# Patient Record
Sex: Male | Born: 1960 | Race: White | Hispanic: No | Smoking: Current every day smoker
Health system: Southern US, Community
[De-identification: ages and names within clinical notes are randomized; demographics above are authoritative.]

## PROBLEM LIST (undated history)

## (undated) DIAGNOSIS — I219 Acute myocardial infarction, unspecified: Secondary | ICD-10-CM

## (undated) DIAGNOSIS — I10 Essential (primary) hypertension: Secondary | ICD-10-CM

## (undated) DIAGNOSIS — K52832 Lymphocytic colitis: Secondary | ICD-10-CM

## (undated) DIAGNOSIS — E785 Hyperlipidemia, unspecified: Secondary | ICD-10-CM

## (undated) DIAGNOSIS — F1011 Alcohol abuse, in remission: Secondary | ICD-10-CM

## (undated) DIAGNOSIS — I639 Cerebral infarction, unspecified: Secondary | ICD-10-CM

## (undated) DIAGNOSIS — R3915 Urgency of urination: Secondary | ICD-10-CM

## (undated) DIAGNOSIS — Z87442 Personal history of urinary calculi: Secondary | ICD-10-CM

## (undated) DIAGNOSIS — M1990 Unspecified osteoarthritis, unspecified site: Secondary | ICD-10-CM

## (undated) DIAGNOSIS — D126 Benign neoplasm of colon, unspecified: Secondary | ICD-10-CM

## (undated) DIAGNOSIS — I739 Peripheral vascular disease, unspecified: Secondary | ICD-10-CM

## (undated) DIAGNOSIS — C61 Malignant neoplasm of prostate: Secondary | ICD-10-CM

## (undated) DIAGNOSIS — K219 Gastro-esophageal reflux disease without esophagitis: Secondary | ICD-10-CM

## (undated) DIAGNOSIS — G459 Transient cerebral ischemic attack, unspecified: Secondary | ICD-10-CM

## (undated) DIAGNOSIS — N189 Chronic kidney disease, unspecified: Secondary | ICD-10-CM

## (undated) DIAGNOSIS — K7581 Nonalcoholic steatohepatitis (NASH): Secondary | ICD-10-CM

## (undated) DIAGNOSIS — Z86010 Personal history of colonic polyps: Secondary | ICD-10-CM

## (undated) DIAGNOSIS — M549 Dorsalgia, unspecified: Secondary | ICD-10-CM

## (undated) DIAGNOSIS — R7303 Prediabetes: Secondary | ICD-10-CM

## (undated) DIAGNOSIS — Z8709 Personal history of other diseases of the respiratory system: Secondary | ICD-10-CM

## (undated) DIAGNOSIS — I2510 Atherosclerotic heart disease of native coronary artery without angina pectoris: Secondary | ICD-10-CM

## (undated) DIAGNOSIS — K2270 Barrett's esophagus without dysplasia: Secondary | ICD-10-CM

## (undated) DIAGNOSIS — M2550 Pain in unspecified joint: Secondary | ICD-10-CM

## (undated) DIAGNOSIS — F1010 Alcohol abuse, uncomplicated: Secondary | ICD-10-CM

## (undated) DIAGNOSIS — J449 Chronic obstructive pulmonary disease, unspecified: Secondary | ICD-10-CM

## (undated) DIAGNOSIS — G8929 Other chronic pain: Secondary | ICD-10-CM

## (undated) HISTORY — DX: Chronic obstructive pulmonary disease, unspecified: J44.9

## (undated) HISTORY — DX: Peripheral vascular disease, unspecified: I73.9

## (undated) HISTORY — DX: Cerebral infarction, unspecified: I63.9

## (undated) HISTORY — PX: COLONOSCOPY: SHX174

## (undated) HISTORY — DX: Essential (primary) hypertension: I10

## (undated) HISTORY — PX: PROSTATE BIOPSY: SHX241

## (undated) HISTORY — DX: Benign neoplasm of colon, unspecified: D12.6

## (undated) HISTORY — DX: Transient cerebral ischemic attack, unspecified: G45.9

## (undated) HISTORY — PX: LUMBAR SPINE SURGERY: SHX701

## (undated) HISTORY — DX: Alcohol abuse, in remission: F10.11

## (undated) HISTORY — DX: Hyperlipidemia, unspecified: E78.5

## (undated) HISTORY — DX: Gastro-esophageal reflux disease without esophagitis: K21.9

## (undated) HISTORY — PX: ESOPHAGOGASTRODUODENOSCOPY: SHX1529

## (undated) HISTORY — DX: Lymphocytic colitis: K52.832

## (undated) HISTORY — PX: BACK SURGERY: SHX140

## (undated) HISTORY — DX: Dorsalgia, unspecified: M54.9

## (undated) HISTORY — DX: Barrett's esophagus without dysplasia: K22.70

## (undated) HISTORY — DX: Personal history of colonic polyps: Z86.010

## (undated) HISTORY — PX: NASAL SINUS SURGERY: SHX719

## (undated) HISTORY — DX: Nonalcoholic steatohepatitis (NASH): K75.81

## (undated) HISTORY — DX: Other chronic pain: G89.29

## (undated) HISTORY — DX: Alcohol abuse, uncomplicated: F10.10

---

## 1998-12-26 ENCOUNTER — Emergency Department (HOSPITAL_COMMUNITY): Admission: EM | Admit: 1998-12-26 | Discharge: 1998-12-26 | Payer: Self-pay | Admitting: Emergency Medicine

## 1999-03-11 ENCOUNTER — Emergency Department (HOSPITAL_COMMUNITY): Admission: EM | Admit: 1999-03-11 | Discharge: 1999-03-11 | Payer: Self-pay | Admitting: Emergency Medicine

## 1999-03-29 ENCOUNTER — Ambulatory Visit: Admission: RE | Admit: 1999-03-29 | Discharge: 1999-03-29 | Payer: Self-pay | Admitting: Endocrinology

## 2000-01-28 ENCOUNTER — Encounter: Payer: Self-pay | Admitting: Internal Medicine

## 2000-01-28 ENCOUNTER — Emergency Department (HOSPITAL_COMMUNITY): Admission: EM | Admit: 2000-01-28 | Discharge: 2000-01-28 | Payer: Self-pay | Admitting: Emergency Medicine

## 2000-02-03 ENCOUNTER — Ambulatory Visit (HOSPITAL_COMMUNITY): Admission: RE | Admit: 2000-02-03 | Discharge: 2000-02-03 | Payer: Self-pay | Admitting: Endocrinology

## 2000-02-03 ENCOUNTER — Encounter: Payer: Self-pay | Admitting: Endocrinology

## 2000-02-06 ENCOUNTER — Ambulatory Visit: Admission: RE | Admit: 2000-02-06 | Discharge: 2000-02-06 | Payer: Self-pay | Admitting: Endocrinology

## 2000-09-01 ENCOUNTER — Emergency Department (HOSPITAL_COMMUNITY): Admission: EM | Admit: 2000-09-01 | Discharge: 2000-09-01 | Payer: Self-pay | Admitting: *Deleted

## 2000-12-23 ENCOUNTER — Encounter: Payer: Self-pay | Admitting: Neurosurgery

## 2000-12-23 ENCOUNTER — Ambulatory Visit (HOSPITAL_COMMUNITY): Admission: RE | Admit: 2000-12-23 | Discharge: 2000-12-23 | Payer: Self-pay | Admitting: Neurosurgery

## 2001-01-21 ENCOUNTER — Encounter: Payer: Self-pay | Admitting: Neurosurgery

## 2001-01-21 ENCOUNTER — Ambulatory Visit (HOSPITAL_COMMUNITY): Admission: RE | Admit: 2001-01-21 | Discharge: 2001-01-21 | Payer: Self-pay | Admitting: Neurosurgery

## 2001-02-04 ENCOUNTER — Encounter: Payer: Self-pay | Admitting: Neurosurgery

## 2001-02-04 ENCOUNTER — Ambulatory Visit (HOSPITAL_COMMUNITY): Admission: RE | Admit: 2001-02-04 | Discharge: 2001-02-04 | Payer: Self-pay | Admitting: Neurosurgery

## 2001-02-23 ENCOUNTER — Ambulatory Visit (HOSPITAL_COMMUNITY): Admission: RE | Admit: 2001-02-23 | Discharge: 2001-02-23 | Payer: Self-pay | Admitting: Neurosurgery

## 2001-02-23 ENCOUNTER — Encounter: Payer: Self-pay | Admitting: Neurosurgery

## 2001-05-15 ENCOUNTER — Emergency Department (HOSPITAL_COMMUNITY): Admission: EM | Admit: 2001-05-15 | Discharge: 2001-05-15 | Payer: Self-pay | Admitting: Emergency Medicine

## 2001-12-29 HISTORY — PX: BAND HEMORRHOIDECTOMY: SHX1213

## 2002-05-06 ENCOUNTER — Encounter: Admission: RE | Admit: 2002-05-06 | Discharge: 2002-05-06 | Payer: Self-pay | Admitting: Neurosurgery

## 2002-06-10 ENCOUNTER — Emergency Department (HOSPITAL_COMMUNITY): Admission: EM | Admit: 2002-06-10 | Discharge: 2002-06-10 | Payer: Self-pay | Admitting: Emergency Medicine

## 2002-07-20 DIAGNOSIS — Z860100 Personal history of colon polyps, unspecified: Secondary | ICD-10-CM

## 2002-07-20 HISTORY — DX: Personal history of colon polyps, unspecified: Z86.0100

## 2002-07-20 HISTORY — DX: Personal history of colonic polyps: Z86.010

## 2002-10-12 ENCOUNTER — Observation Stay (HOSPITAL_COMMUNITY): Admission: EM | Admit: 2002-10-12 | Discharge: 2002-10-13 | Payer: Self-pay | Admitting: Emergency Medicine

## 2002-10-12 ENCOUNTER — Encounter: Payer: Self-pay | Admitting: Internal Medicine

## 2002-10-12 ENCOUNTER — Encounter: Payer: Self-pay | Admitting: Emergency Medicine

## 2002-10-13 ENCOUNTER — Encounter (INDEPENDENT_AMBULATORY_CARE_PROVIDER_SITE_OTHER): Payer: Self-pay | Admitting: *Deleted

## 2002-12-11 ENCOUNTER — Emergency Department (HOSPITAL_COMMUNITY): Admission: EM | Admit: 2002-12-11 | Discharge: 2002-12-11 | Payer: Self-pay | Admitting: Emergency Medicine

## 2002-12-12 ENCOUNTER — Emergency Department (HOSPITAL_COMMUNITY): Admission: EM | Admit: 2002-12-12 | Discharge: 2002-12-12 | Payer: Self-pay | Admitting: Emergency Medicine

## 2002-12-12 ENCOUNTER — Encounter: Payer: Self-pay | Admitting: Internal Medicine

## 2002-12-13 ENCOUNTER — Ambulatory Visit (HOSPITAL_COMMUNITY): Admission: RE | Admit: 2002-12-13 | Discharge: 2002-12-13 | Payer: Self-pay | Admitting: Internal Medicine

## 2003-02-10 ENCOUNTER — Ambulatory Visit (HOSPITAL_COMMUNITY): Admission: RE | Admit: 2003-02-10 | Discharge: 2003-02-10 | Payer: Self-pay | Admitting: Internal Medicine

## 2003-06-17 ENCOUNTER — Emergency Department (HOSPITAL_COMMUNITY): Admission: EM | Admit: 2003-06-17 | Discharge: 2003-06-17 | Payer: Self-pay | Admitting: Emergency Medicine

## 2003-06-17 ENCOUNTER — Encounter: Payer: Self-pay | Admitting: Emergency Medicine

## 2003-07-04 ENCOUNTER — Encounter: Payer: Self-pay | Admitting: Gastroenterology

## 2003-07-04 ENCOUNTER — Inpatient Hospital Stay (HOSPITAL_COMMUNITY): Admission: EM | Admit: 2003-07-04 | Discharge: 2003-07-06 | Payer: Self-pay | Admitting: Emergency Medicine

## 2003-07-05 ENCOUNTER — Encounter: Payer: Self-pay | Admitting: Gastroenterology

## 2003-07-05 ENCOUNTER — Encounter (INDEPENDENT_AMBULATORY_CARE_PROVIDER_SITE_OTHER): Payer: Self-pay | Admitting: Specialist

## 2003-07-06 ENCOUNTER — Encounter: Payer: Self-pay | Admitting: Gastroenterology

## 2004-05-13 ENCOUNTER — Observation Stay (HOSPITAL_COMMUNITY): Admission: EM | Admit: 2004-05-13 | Discharge: 2004-05-14 | Payer: Self-pay | Admitting: Emergency Medicine

## 2004-11-07 ENCOUNTER — Ambulatory Visit: Payer: Self-pay | Admitting: Pain Medicine

## 2004-11-19 ENCOUNTER — Ambulatory Visit: Payer: Self-pay | Admitting: Pain Medicine

## 2004-12-24 ENCOUNTER — Ambulatory Visit: Payer: Self-pay | Admitting: Pain Medicine

## 2005-01-07 ENCOUNTER — Ambulatory Visit: Payer: Self-pay | Admitting: Physician Assistant

## 2005-01-15 ENCOUNTER — Ambulatory Visit: Payer: Self-pay | Admitting: Pain Medicine

## 2005-02-04 ENCOUNTER — Ambulatory Visit: Payer: Self-pay | Admitting: Physician Assistant

## 2005-02-26 ENCOUNTER — Emergency Department (HOSPITAL_COMMUNITY): Admission: EM | Admit: 2005-02-26 | Discharge: 2005-02-26 | Payer: Self-pay | Admitting: Emergency Medicine

## 2005-03-18 ENCOUNTER — Ambulatory Visit: Payer: Self-pay | Admitting: Physician Assistant

## 2005-03-24 ENCOUNTER — Encounter: Admission: RE | Admit: 2005-03-24 | Discharge: 2005-03-24 | Payer: Self-pay | Admitting: Neurosurgery

## 2005-05-01 ENCOUNTER — Ambulatory Visit: Payer: Self-pay | Admitting: Physician Assistant

## 2005-05-29 ENCOUNTER — Ambulatory Visit: Payer: Self-pay | Admitting: Physician Assistant

## 2005-06-26 ENCOUNTER — Ambulatory Visit: Payer: Self-pay | Admitting: Physician Assistant

## 2005-07-02 ENCOUNTER — Ambulatory Visit: Payer: Self-pay | Admitting: Endocrinology

## 2005-07-21 ENCOUNTER — Ambulatory Visit: Payer: Self-pay | Admitting: Endocrinology

## 2005-07-28 ENCOUNTER — Ambulatory Visit: Payer: Self-pay | Admitting: Physician Assistant

## 2005-07-29 ENCOUNTER — Ambulatory Visit: Payer: Self-pay | Admitting: Endocrinology

## 2005-08-07 ENCOUNTER — Ambulatory Visit: Payer: Self-pay | Admitting: Internal Medicine

## 2005-08-11 ENCOUNTER — Ambulatory Visit (HOSPITAL_COMMUNITY): Admission: RE | Admit: 2005-08-11 | Discharge: 2005-08-11 | Payer: Self-pay | Admitting: Internal Medicine

## 2005-08-11 ENCOUNTER — Ambulatory Visit: Payer: Self-pay | Admitting: Internal Medicine

## 2005-08-28 ENCOUNTER — Ambulatory Visit: Payer: Self-pay | Admitting: Physician Assistant

## 2005-09-15 ENCOUNTER — Ambulatory Visit (HOSPITAL_COMMUNITY): Admission: RE | Admit: 2005-09-15 | Discharge: 2005-09-15 | Payer: Self-pay | Admitting: Neurosurgery

## 2005-09-15 ENCOUNTER — Ambulatory Visit: Payer: Self-pay | Admitting: Endocrinology

## 2005-09-16 ENCOUNTER — Ambulatory Visit: Payer: Self-pay

## 2005-09-17 ENCOUNTER — Inpatient Hospital Stay (HOSPITAL_COMMUNITY): Admission: RE | Admit: 2005-09-17 | Discharge: 2005-09-19 | Payer: Self-pay | Admitting: Neurosurgery

## 2005-10-03 ENCOUNTER — Ambulatory Visit: Payer: Self-pay | Admitting: Physician Assistant

## 2006-01-30 ENCOUNTER — Emergency Department (HOSPITAL_COMMUNITY): Admission: EM | Admit: 2006-01-30 | Discharge: 2006-01-30 | Payer: Self-pay | Admitting: Emergency Medicine

## 2006-02-09 ENCOUNTER — Ambulatory Visit: Payer: Self-pay | Admitting: Physician Assistant

## 2006-06-10 ENCOUNTER — Ambulatory Visit: Payer: Self-pay | Admitting: Endocrinology

## 2006-08-05 ENCOUNTER — Ambulatory Visit: Payer: Self-pay | Admitting: Endocrinology

## 2006-08-10 ENCOUNTER — Ambulatory Visit: Payer: Self-pay | Admitting: Endocrinology

## 2006-09-11 ENCOUNTER — Ambulatory Visit: Payer: Self-pay | Admitting: Physician Assistant

## 2006-10-06 ENCOUNTER — Ambulatory Visit: Payer: Self-pay | Admitting: Physician Assistant

## 2006-11-03 ENCOUNTER — Ambulatory Visit: Payer: Self-pay | Admitting: Physician Assistant

## 2006-12-07 ENCOUNTER — Ambulatory Visit: Payer: Self-pay | Admitting: Physician Assistant

## 2006-12-11 ENCOUNTER — Emergency Department (HOSPITAL_COMMUNITY): Admission: EM | Admit: 2006-12-11 | Discharge: 2006-12-11 | Payer: Self-pay | Admitting: Emergency Medicine

## 2007-01-07 ENCOUNTER — Ambulatory Visit: Payer: Self-pay | Admitting: Physician Assistant

## 2007-02-09 ENCOUNTER — Ambulatory Visit: Payer: Self-pay | Admitting: Physician Assistant

## 2007-03-09 ENCOUNTER — Ambulatory Visit: Payer: Self-pay | Admitting: Physician Assistant

## 2007-04-12 ENCOUNTER — Ambulatory Visit: Payer: Self-pay | Admitting: Physician Assistant

## 2007-04-28 ENCOUNTER — Other Ambulatory Visit: Payer: Self-pay

## 2007-04-28 ENCOUNTER — Ambulatory Visit: Payer: Self-pay | Admitting: Pain Medicine

## 2007-05-04 ENCOUNTER — Ambulatory Visit: Payer: Self-pay | Admitting: Pain Medicine

## 2007-05-12 ENCOUNTER — Ambulatory Visit: Payer: Self-pay | Admitting: Physician Assistant

## 2007-05-19 ENCOUNTER — Ambulatory Visit: Payer: Self-pay | Admitting: Physician Assistant

## 2007-06-09 ENCOUNTER — Ambulatory Visit: Payer: Self-pay | Admitting: Physician Assistant

## 2007-07-22 ENCOUNTER — Ambulatory Visit: Payer: Self-pay | Admitting: Physician Assistant

## 2007-08-10 ENCOUNTER — Ambulatory Visit: Payer: Self-pay | Admitting: Pain Medicine

## 2007-08-24 ENCOUNTER — Ambulatory Visit: Payer: Self-pay | Admitting: Physician Assistant

## 2007-08-25 ENCOUNTER — Encounter: Payer: Self-pay | Admitting: Endocrinology

## 2007-08-25 DIAGNOSIS — I10 Essential (primary) hypertension: Secondary | ICD-10-CM

## 2007-08-25 DIAGNOSIS — M109 Gout, unspecified: Secondary | ICD-10-CM

## 2007-08-25 DIAGNOSIS — K219 Gastro-esophageal reflux disease without esophagitis: Secondary | ICD-10-CM

## 2007-09-23 ENCOUNTER — Ambulatory Visit: Payer: Self-pay | Admitting: Pain Medicine

## 2007-10-25 ENCOUNTER — Ambulatory Visit: Payer: Self-pay | Admitting: Physician Assistant

## 2007-11-16 ENCOUNTER — Ambulatory Visit: Payer: Self-pay | Admitting: Pain Medicine

## 2007-11-22 ENCOUNTER — Ambulatory Visit: Payer: Self-pay | Admitting: Physician Assistant

## 2007-12-20 ENCOUNTER — Ambulatory Visit: Payer: Self-pay | Admitting: Physician Assistant

## 2007-12-30 DIAGNOSIS — K52832 Lymphocytic colitis: Secondary | ICD-10-CM

## 2007-12-30 HISTORY — DX: Lymphocytic colitis: K52.832

## 2008-02-04 ENCOUNTER — Ambulatory Visit: Payer: Self-pay | Admitting: Physician Assistant

## 2008-02-07 ENCOUNTER — Encounter: Admission: RE | Admit: 2008-02-07 | Discharge: 2008-02-07 | Payer: Self-pay | Admitting: Family Medicine

## 2008-05-02 ENCOUNTER — Ambulatory Visit: Payer: Self-pay | Admitting: Physician Assistant

## 2008-08-01 ENCOUNTER — Ambulatory Visit: Payer: Self-pay | Admitting: Physician Assistant

## 2008-11-01 ENCOUNTER — Ambulatory Visit: Payer: Self-pay | Admitting: Pain Medicine

## 2008-11-17 ENCOUNTER — Inpatient Hospital Stay (HOSPITAL_COMMUNITY): Admission: EM | Admit: 2008-11-17 | Discharge: 2008-11-18 | Payer: Self-pay | Admitting: Emergency Medicine

## 2008-11-17 ENCOUNTER — Encounter (INDEPENDENT_AMBULATORY_CARE_PROVIDER_SITE_OTHER): Payer: Self-pay | Admitting: *Deleted

## 2008-12-29 HISTORY — PX: CARDIAC CATHETERIZATION: SHX172

## 2009-02-01 ENCOUNTER — Ambulatory Visit: Payer: Self-pay | Admitting: Physician Assistant

## 2009-11-28 ENCOUNTER — Inpatient Hospital Stay (HOSPITAL_COMMUNITY): Admission: EM | Admit: 2009-11-28 | Discharge: 2009-11-30 | Payer: Self-pay | Admitting: Emergency Medicine

## 2010-02-27 ENCOUNTER — Observation Stay (HOSPITAL_COMMUNITY): Admission: EM | Admit: 2010-02-27 | Discharge: 2010-02-27 | Payer: Self-pay | Admitting: Emergency Medicine

## 2010-03-01 ENCOUNTER — Ambulatory Visit: Payer: Self-pay | Admitting: Gastroenterology

## 2010-03-01 DIAGNOSIS — Z8719 Personal history of other diseases of the digestive system: Secondary | ICD-10-CM | POA: Insufficient documentation

## 2010-03-01 DIAGNOSIS — A0472 Enterocolitis due to Clostridium difficile, not specified as recurrent: Secondary | ICD-10-CM

## 2010-03-04 ENCOUNTER — Telehealth: Payer: Self-pay | Admitting: Nurse Practitioner

## 2010-03-08 ENCOUNTER — Telehealth: Payer: Self-pay | Admitting: Nurse Practitioner

## 2010-03-11 ENCOUNTER — Telehealth: Payer: Self-pay | Admitting: Nurse Practitioner

## 2010-03-12 ENCOUNTER — Ambulatory Visit: Payer: Self-pay | Admitting: Nurse Practitioner

## 2010-03-12 ENCOUNTER — Ambulatory Visit: Payer: Self-pay | Admitting: Gastroenterology

## 2010-03-12 DIAGNOSIS — K648 Other hemorrhoids: Secondary | ICD-10-CM | POA: Insufficient documentation

## 2010-03-12 LAB — CONVERTED CEMR LAB
BUN: 16 mg/dL (ref 6–23)
Eosinophils Absolute: 0.1 10*3/uL (ref 0.0–0.7)
HCT: 44.2 % (ref 39.0–52.0)
Lymphocytes Relative: 41 % (ref 12.0–46.0)
MCV: 89.3 fL (ref 78.0–100.0)
Neutro Abs: 3.9 10*3/uL (ref 1.4–7.7)
Neutrophils Relative %: 48.4 % (ref 43.0–77.0)
Potassium: 4.7 meq/L (ref 3.5–5.1)
WBC: 8 10*3/uL (ref 4.5–10.5)

## 2010-03-13 ENCOUNTER — Telehealth: Payer: Self-pay | Admitting: Nurse Practitioner

## 2010-03-22 ENCOUNTER — Telehealth: Payer: Self-pay | Admitting: Nurse Practitioner

## 2010-03-25 ENCOUNTER — Encounter (INDEPENDENT_AMBULATORY_CARE_PROVIDER_SITE_OTHER): Payer: Self-pay | Admitting: *Deleted

## 2010-03-25 ENCOUNTER — Telehealth: Payer: Self-pay | Admitting: Nurse Practitioner

## 2010-03-25 ENCOUNTER — Ambulatory Visit: Payer: Self-pay | Admitting: Internal Medicine

## 2010-03-25 DIAGNOSIS — R197 Diarrhea, unspecified: Secondary | ICD-10-CM

## 2010-03-26 ENCOUNTER — Ambulatory Visit: Payer: Self-pay | Admitting: Internal Medicine

## 2010-03-28 ENCOUNTER — Encounter: Payer: Self-pay | Admitting: Internal Medicine

## 2010-03-28 ENCOUNTER — Telehealth: Payer: Self-pay | Admitting: Internal Medicine

## 2010-05-01 ENCOUNTER — Telehealth: Payer: Self-pay | Admitting: Internal Medicine

## 2010-06-06 ENCOUNTER — Emergency Department (HOSPITAL_COMMUNITY): Admission: EM | Admit: 2010-06-06 | Discharge: 2010-06-06 | Payer: Self-pay | Admitting: Emergency Medicine

## 2010-10-21 ENCOUNTER — Ambulatory Visit: Payer: Self-pay | Admitting: Internal Medicine

## 2010-10-21 ENCOUNTER — Inpatient Hospital Stay (HOSPITAL_COMMUNITY): Admission: EM | Admit: 2010-10-21 | Discharge: 2010-10-22 | Payer: Self-pay | Admitting: Emergency Medicine

## 2010-10-29 ENCOUNTER — Encounter: Admission: RE | Admit: 2010-10-29 | Discharge: 2010-10-29 | Payer: Self-pay | Admitting: Family Medicine

## 2010-11-07 ENCOUNTER — Encounter: Payer: Self-pay | Admitting: Family Medicine

## 2010-11-08 ENCOUNTER — Ambulatory Visit: Payer: Self-pay | Admitting: Cardiology

## 2010-11-18 ENCOUNTER — Ambulatory Visit (HOSPITAL_COMMUNITY): Admission: RE | Admit: 2010-11-18 | Discharge: 2010-11-18 | Payer: Self-pay | Admitting: Interventional Radiology

## 2010-12-02 ENCOUNTER — Ambulatory Visit (HOSPITAL_COMMUNITY): Admission: RE | Admit: 2010-12-02 | Discharge: 2010-12-02 | Payer: Self-pay | Admitting: Family Medicine

## 2010-12-09 ENCOUNTER — Ambulatory Visit: Payer: Self-pay | Admitting: Cardiology

## 2010-12-29 HISTORY — PX: ANEURYSM COILING: SHX5349

## 2011-01-01 ENCOUNTER — Inpatient Hospital Stay (HOSPITAL_COMMUNITY)
Admission: RE | Admit: 2011-01-01 | Discharge: 2011-01-02 | Payer: Self-pay | Source: Home / Self Care | Attending: Interventional Radiology | Admitting: Interventional Radiology

## 2011-01-01 LAB — APTT: aPTT: 32 seconds (ref 24–37)

## 2011-01-02 LAB — APTT: aPTT: 35 seconds (ref 24–37)

## 2011-01-02 LAB — CBC
HCT: 41.3 % (ref 39.0–52.0)
Hemoglobin: 14.1 g/dL (ref 13.0–17.0)
MCH: 30 pg (ref 26.0–34.0)
MCHC: 34.1 g/dL (ref 30.0–36.0)
MCV: 87.9 fL (ref 78.0–100.0)
Platelets: 164 10*3/uL (ref 150–400)
RBC: 4.7 MIL/uL (ref 4.22–5.81)
RDW: 12.5 % (ref 11.5–15.5)
WBC: 7.4 10*3/uL (ref 4.0–10.5)

## 2011-01-02 LAB — BASIC METABOLIC PANEL
BUN: 8 mg/dL (ref 6–23)
CO2: 25 mEq/L (ref 19–32)
Calcium: 9 mg/dL (ref 8.4–10.5)
Chloride: 109 mEq/L (ref 96–112)
Creatinine, Ser: 0.73 mg/dL (ref 0.4–1.5)
GFR calc Af Amer: >60 mL/min (ref >60)
GFR calc non Af Amer: >60 mL/min (ref >60)
Glucose, Bld: 111 mg/dL — ABNORMAL HIGH (ref 70–99)
Potassium: 4 mEq/L (ref 3.5–5.1)
Sodium: 140 mEq/L (ref 135–145)

## 2011-01-02 LAB — PROTIME-INR
INR: 0.9 (ref 0.00–1.49)
Prothrombin Time: 12.4 seconds (ref 11.6–15.2)

## 2011-01-15 ENCOUNTER — Ambulatory Visit (HOSPITAL_COMMUNITY)
Admission: RE | Admit: 2011-01-15 | Discharge: 2011-01-15 | Payer: Self-pay | Source: Home / Self Care | Attending: Interventional Radiology | Admitting: Interventional Radiology

## 2011-01-20 NOTE — H&P (Signed)
[name] [name], [name] [name]            ACCOUNT NO.:  [id number]  MEDICAL RECORD NO.:  [id number]          PATIENT TYPE:  AMB  LOCATION:  SDS                          FACILITY:  [facility]  PHYSICIAN:  Addilynne Olheiser K. Joani Cosma, M.D.DATE OF BIRTH:  26-Oct-1961  DATE OF ADMISSION:  01/01/2011 DATE OF DISCHARGE:                             HISTORY & PHYSICAL   CHIEF COMPLAINT:  Cerebral aneurysm.  BRIEF HISTORY:  This is a pleasant 50 year old male who was admitted to [facility] from October 21, 2010 to October 22, 2010 with weakness, presyncope, chest and arm pain.  The patient was evaluated by Cardiology and it was not felt that his symptoms were cardiac in nature.  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  This was felt to represent a possible increase in size from a previous study in 2009.  It was felt that the patient was having TIAs.  He was already on aspirin.  Plavix was added to his medications.  He had a cerebral angiogram performed on November 13, 2010, by Dr. 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 The patient was seen in consultation by Dr. Estanislado Pandy on December 02, 2010. At that time treatment options were discussed including the risks, benefits, and alternatives.  The patient elected to proceed with endovascular treatment and was admitted to [facility] today on January 01, 2011, for further intervention.  PAST MEDICAL HISTORY:  Significant for the above-noted TIAs with a possible CVA approximately 1 year ago.  He has a history of hyperlipidemia, hypertension, coronary artery disease with a cardiac catheterization in December 2010 revealing a 70-80% LAD stenosis, which was not felt to be amendable to percutaneous intervention.  The patient was seen recently by Dr. Mare Ferrari and cleared for general anesthesia. The patient also has a  history of gout, chronic back pain, asthma, cirrhosis, pancreatitis, tobacco use, seasonal allergies, hemorrhoids, and insomnia.  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  PAST SURGICAL HISTORY:  Significant for back surgery.  He still has problems with his back and ambulates with a cane.  ALLERGIES:  He is allergic to PENICILLIN and possibly LYRICA.  ADMITTING MEDICATIONS: 1. Advil p.r.n. 2. Fish oil. 3. Ventolin metered-dose inhaler 2 puffs every 4 hours as needed. 4. Trazodone 50 mg at bedtime. 5. Plavix 75 mg daily. 6. Nitroglycerin sublingually p.r.n. chest pain. 7. Nexium 40 mg twice daily. 8. Multivitamins daily. 9. Milk thistle over-the-counter daily. 10.Metoprolol 50 mg twice daily. 11.Losartan 100 mg each morning. 12.Aspirin 81 mg daily. 13.Allopurinol 100 mg daily.  SOCIAL HISTORY:  The patient is married.  He has two sisters, one with hypertension and one with psychiatric problems.  He had a brother who was killed in a motor vehicle accident.REVIEW OF SYSTEMS:  The review of systems was completely negative except for frequent headaches and back pain.  LABORATORY DATA:  An INR was 0.89, a protime was 12.2, a PTT was 27, BUN was 10, creatinine 0.7, potassium 4.5, GFR was greater than  60, and glucose was 117.  Hemoglobin was 15.4, hematocrit 44.7, WBC is 89,000, and platelets 181,000.  IMAGING DATA:  A preoperative screen was negative for MRSA.  PHYSICAL EXAMINATION:  GENERAL:  A pleasant, somewhat anxious 50 year old white male in no acute distress. VITAL SIGNS:  Blood pressure 139/97, pulse 80, respirations 18, temperature 97.9, and oxygen saturation 97% on room air. HEENT: Unremarkable. HEART:  Regular rate and rhythm without murmur. LUNGS:  Clear. ABDOMEN:  Soft, nontender. EXTREMITIES:  Pulses to be intact with minimal trace edema.  His airway was rated at a 1.  His ASA scale was rated at a 4. NEUROLOGIC:  The patient to be alert and  oriented and able to follow three-step commands.  Cranial nerves II through XII are grossly intact. Sensation was intact to light touch.  Motor strength revealed very mild left lower extremity weakness, otherwise motor strength was 5/5. Cerebellar testing was intact.  IMPRESSION: 1. Left posterior communicating artery aneurysm by cerebral angiogram     performed on November 13, 2010, that measured 4.3 mm x 3.7 mm. 2. History of transient ischemic attacks and a cerebrovascular     accident. 3. Hyperlipidemia. 4. Coronary artery disease with a 70-80% LAD stenosis not felt to be     amendable to intervention. 5. Gout. 6. Chronic back pain with gait disorder. 7. Asthma. 8. History of cirrhosis. 9. History of pancreatitis. 10.Remote history of alcohol abuse, sober x17 years. 11.History of tobacco use. 12.Seasonal allergies. 13.Hemorrhoids. 14.Insomnia. 15.Previous back surgery.  PLAN:  The patient is admitted today for endovascular treatment of his cerebral aneurysm to be performed by Dr. Estanislado Pandy under general anesthesia.     Mikey Bussing, P.A.   ______________________________ Fritz Pickerel. Estanislado Pandy, M.D.    DR/MEDQ  D:  01/01/2011  T:  01/01/2011  Job:  [id number]  cc:   Azalia Bilis, M.D. Darlin Coco, M.D.  Electronically Signed by Mikey Bussing P.A. on 01/14/2011 11:55:15 AM Electronically Signed by Luanne Bras M.D. on 01/20/2011 11:22:47 AM

## 2011-01-28 NOTE — Progress Notes (Signed)
Summary: triage   Phone Note Call from Patient Call back at [phone] (cell)   Caller: Patient Call For: [name] [name] Reason for Call: Talk to Nurse Summary of Call: progress report: still having diarrhea about 6-7 times a day... started seeing bright red blood in stool since Saturday and now blood coming from rectum without having a BM... feeling very bloated... abd is hurting up into ribcage Initial call taken by: Lucien Mons,  March 11, 2010 9:19 AM  Follow-up for Phone Call        Pt is down to 6-7 BM's of diarrhea with blood. Feels very tired.  I told pt to come to our lab tomorrow and come to see Nevin Bloodgood again at 2:00 PM per [place]. Follow-up by: Sharol Roussel,  March 11, 2010 4:23 PM

## 2011-01-28 NOTE — Procedures (Signed)
Summary: Flex Sig  Patient Name: [name] [name], [name] [name] MRN: [id number] Procedure Procedures: Flexible Proctosigmoidoscopy CPT: [phone].  Personnel: Endoscopist: Gatha Mayer, MD, [facility].  Exam Location: Exam performed in Endoscopy Suite. Outpatient  Patient Consent: Procedure, Alternatives, Risks and Benefits discussed, consent obtained,  Indications  Therapeutic Intervention Reason for exam: Hemorrhoidal Banding.  Symptoms: Rectal Bleeding.  History  Current Medications: Patient is not currently taking Coumadin.  Pre-Exam Physical: Performed Aug 11, 2005. Cardio-pulmonary exam  WNL. Rectal exam, HEENT exam , Abdominal exam, Mental status exam WNL.  Exam Exam: Extent visualized: Sigmoid Colon. Extent of exam: 35 cm. Patient position: on left side. Colon retroflexion performed. ASA Classification: I. Tolerance: excellent.  Monitoring: Pulse and BP monitoring, Oximetry used. Supplemental O2 given.  Colon Prep Used Fleets enema for colon prep. Prep: good.  Fluoroscopy: Fluoroscopy was not used.  Sedation Meds: Patient assessed and found to be appropriate for moderate (conscious) sedation. Fentanyl 50 mcg. given IV. Versed 6 mg. given IV.  Findings - NORMAL EXAM: Sigmoid Colon.  HEMORRHOIDS: Internal and External. Size: Small. ICD9: Hemorrhoids, Internal and  External: 455.6. Comments: His hemorrhoids are in the canal and very close to and below dentate line it appears.   Assessment  Diagnoses: 455.6: Hemorrhoids, Internal and  External.   Comments: Hemorrhoidal banding not performed due to size and location of hemorrhoids. I think they are small and there is an external component in the canal. In order to effectively ligate I think he may have considerable pain. Events  Unplanned Intervention: No intervention was required.  Plans Comments: Will try Canasa suppository qhs. If persistent symptoms, surgical referral Disposition: After procedure patient sent to  recovery.  Scheduling: Call office for appointment, to Gatha Mayer, MD, [facility], 4-6 weeks,    This report was created from the original endoscopy report, which was reviewed and signed by the above listed endoscopist.

## 2011-01-28 NOTE — Progress Notes (Signed)
Summary: Condition update   Phone Note Call from Patient Call back at Home Phone [phone]   Caller: Patient Call For: Tye Savoy Reason for Call: Talk to Nurse Summary of Call: Pt is calling to give an update: He is still having loose BM 6-7 times other than that he is "doing good" Initial call taken by: Webb Laws,  March 08, 2010 10:35 AM  Follow-up for Phone Call        I spoke to pt's wife and she said he is improving. He has had the BM reduced down the around 6 daily and he is feeling better.  I asked her to have  him call me in the beginning of the week and ask for me and let me know how he is doing. Follow-up by: Sharol Roussel,  March 08, 2010 11:56 AM

## 2011-01-28 NOTE — Procedures (Signed)
Summary: Colonoscopy  [View Reports-CCC] Patient Name: [name], [name] MRN: [id number] Procedure Procedures: Colonoscopy CPT: 43837.    with biopsy. CPT: [id number].  Personnel: Endoscopist: Sandy Salaam. Deatra Ina, MD.  Indications Symptoms: Diarrhea  Abnormal Exams, Studies: CT scan, abnormal.  History  Pre-Exam Physical: Performed Jul 05, 2003. Entire physical exam was normal. Cardio- pulmonary exam, Rectal exam, HEENT exam , Abdominal exam, Mental status exam WNL.  Exam Exam: Extent of exam reached: Cecum, extent intended: Cecum.  The cecum was identified by IC valve. Colon retroflexion performed. ASA Classification: I. Tolerance: good.  Monitoring: Pulse and BP monitoring, Oximetry used. Supplemental O2 given. at 2 Liters.  Colon Prep Used Golytely for colon prep. Prep results: good.  Sedation Meds: Fentanyl 75 mcg. given IV. Versed 8 mg. given IV.  Findings - NORMAL EXAM: Cecum to Rectum. Biopsy/Normal Exam taken. Comments: Random biopsies taken to r/o microscopic colitis.   Assessment Normal examination.  Events  Unplanned Interventions: No intervention was required.  Unplanned Events: There were no complications. Plans Medication Plan: Await pathology. Continue current medications.  Scheduling/Referral: UGI/small bowel follow through, on Jul 06, 2003.    This report was created from the original endoscopy report, which was reviewed and signed by the above listed endoscopist.

## 2011-01-28 NOTE — Progress Notes (Signed)
Summary: Change to generic   Phone Note Call from Patient Call back at Home Phone [phone] Call back at [phone]   Caller: Patient Summary of Call: [facility]. needs ok to give the generic for the supp. patient has picked up all other rxs but he has ot picked up the supp [facility] is waiting on Pam to call back with ok to fill generic. Initial call taken by: Bernita Buffy CMA Deborra Medina),  March 13, 2010 3:09 PM  Follow-up for Phone Call        SPoke to pharmacy, [facility] and authorized for them to use Generic.  Follow-up by: Sharol Roussel,  March 14, 2010 9:13 AM

## 2011-01-28 NOTE — Progress Notes (Signed)
Summary: Medical Update-feels okay   Phone Note Outgoing Call Call back at [facility] Phone [phone]   Call placed by: Tye Savoy, NP Call placed to: Patient Details for Reason: Check on him Summary of Call: Called patient at 11:00 today. He is slightly better. No pain, no fevers. Tolerating Flagyl. Will call in [place]. He has a follow up appt. Will call us in meantime for problems / questions.

## 2011-01-28 NOTE — Progress Notes (Signed)
Summary: Abd Pain   Phone Note Call from Patient Call back at Home Phone [phone]   Call For: Tye Savoy, NP Summary of Call: Stomach is hurting alot. Has gone 12 times since this morning. Nothing is working took all his meds. Initial call taken by: Irwin Brakeman [facility],  March 22, 2010 11:26 AM  Follow-up for Phone Call        Called pt, he has had many watery BM's today, 10-12.  He has finished the Flagyl and the Lomotil.  He is drinking water and Gatorade but feels shakey and weak. He is afebrile, but has abd pain above the navel.   I LM for Tye Savoy NP on her cell phone at 11:40 AM to call me back Follow-up by: Sharol Roussel,  March 22, 2010 11:44 AM  Additional Follow-up for Phone Call Additional follow up Details #1::        Spoke with patient March 25th around 5:30pm. He was doing better on Flagyl but diarrhea came back today. He has had numerous loose stools. Still have diffuse abdominal discomfort, not severe. No fever or chills. Will likely need flex sig for evaluation. Advised to drink plenty of fluids. Use Questran 1-2 times a day until flex sign Additional Follow-up by: Tye Savoy NP,  March 25, 2010 9:14 AM

## 2011-01-28 NOTE — Procedures (Signed)
Summary: Colonoscopy  Patient: [name] [name] Note: All result statuses are Final unless otherwise noted.  Tests: (1) Colonoscopy (COL)   COL Colonoscopy           [facility]     [place], [place]  [address]           COLONOSCOPY PROCEDURE REPORT           PATIENT:  [name] [name]  MR#:  [id number]     BIRTHDATE:  1961-07-31, 48 yrs. old  GENDER:  male     ENDOSCOPIST:  Gatha Mayer, MD, [facility]           PROCEDURE DATE:  03/26/2010     PROCEDURE:  Colonoscopy with biopsy and snare polypectomy     ASA CLASS:  Class II     INDICATIONS:  unexplained diarrhea treated for C. diff but     persistent diarrhea, also with prior lymphocytic colitis     MEDICATIONS:   Fentanyl 100 mcg, Versed 10 mg IV           DESCRIPTION OF PROCEDURE:   After the risks benefits and     alternatives of the procedure were thoroughly explained, informed     consent was obtained.  Digital rectal exam was performed and     revealed no abnormalities and normal prostate.   The LB CF-[id number]     [id number] endoscope was introduced through the anus and advanced to     the terminal ileum which was intubated for a short distance,     without limitations.  The quality of the prep was none.  The     instrument was then slowly withdrawn as the colon was fully     examined. Insertion: 1:26 mins Withdrawal: 11:31 mins     <<PROCEDUREIMAGES>>           FINDINGS:  The terminal ileum appeared normal.  Two polyps were     found in the sigmoid colon. They were 4 mm in size. Polyps were     snared without cautery. Retrieval was successful. This was     otherwise a normal examination of the colon. Random biopsies were     obtained and sent to pathology.  Abnormal appearing mucosa in the     rectum. Fissurig of mucosa of ? significance. (biopsies taken with     random colon biopsies)   Retroflexed views in the rectum revealed     no other findings other than those already described.     The scope     was then withdrawn from the patient and the procedure completed.           COMPLICATIONS:  None     ENDOSCOPIC IMPRESSION:     1) Normal terminal ileum     2) 4 mm Two polyps in the sigmoid colon     3) Otherwise normal examination - random biopsies taken     4) Abnormal mucosa in the rectum - biopsied     RECOMMENDATIONS:     1) Await biopsy results     2) hold colchicine     3) will telephone biopsy results and plans     4) loperamide 2 mg (ImodiumAD) may be used 1-2 every 6 hrs for     diarrhea     REPEAT EXAM:  In for Colonoscopy, pending biopsy results.  Gatha Mayer, MD, [facility]           CC:  The Patient     Azalia Bilis, MD           n.     eSIGNED:   Gatha Mayer at 03/26/2010 04:36 PM           [name], [phone]  Note: An exclamation mark (!) indicates a result that was not dispersed into the flowsheet. Document Creation Date: 03/26/2010 4:37 PM _______________________________________________________________________  (1) Order result status: Final Collection or observation date-time: 03/26/2010 16:18 Requested date-time:  Receipt date-time:  Reported date-time:  Referring Physician:   Ordering Physician: Silvano Rusk [phone]) Specimen Source:  Source: Tawanna Cooler Order Number: [id number] Lab site:   Appended Document: Colonoscopy     Procedures Next Due Date:    Colonoscopy: 03/2015

## 2011-01-28 NOTE — Assessment & Plan Note (Signed)
Summary: diarrhea, + hemmocult...em    History of Present Illness Visit Type: Follow-up Visit Primary GI MD: Silvano Rusk MD [facility] Primary Provider: Shirline Frees, MD  Requesting Provider: n/a Chief Complaint: Generalized abd pain, and diarrhea  History of Present Illness:   Saw Dr. Carlean Purl eleven years ago for rectal bleeding / colon polyps. Referred here from ER having he presented there with three week history of nausea,vomiting and diarrhea. Nausea resolved. Diarrhea has gotten worse. Having 7-20 loose stools a day. In last couple of days has developed diffuse abdominal discomfort.  No fevers. No sick contacts. No recent antibiotcs or other medication changes. Has been on Colchicine for several years.      GERD, aymptomatic on twice daily PPI.    GI Review of Systems    Reports abdominal pain.     Location of  Abdominal pain: generalized.    Denies acid reflux, belching, bloating, chest pain, dysphagia with liquids, dysphagia with solids, heartburn, loss of appetite, nausea, vomiting, vomiting blood, weight loss, and  weight gain.      Reports diarrhea.     Denies anal fissure, black tarry stools, change in bowel habit, constipation, diverticulosis, fecal incontinence, heme positive stool, hemorrhoids, irritable bowel syndrome, jaundice, light color stool, liver problems, rectal bleeding, and  rectal pain.    Current Medications (verified): 1)  Nexium 40 Mg  Cpdr (Esomeprazole Magnesium) .... Take 1 By Mouth Qd 2)  Colchicine 0.6 Mg  Tabs (Colchicine) .... Use Prn 3)  Flonase 50 Mcg/act  Susp (Fluticasone Propionate) 4)  Allopurinol 100 Mg Tabs (Allopurinol) .... One Tablet By Mouth Once Daily 5)  Trazamine 50 Mg Misc (Trazodone & Diet Manage Prod) .... One Tablet By Mouth Once Daily 6)  Crestor 5 Mg Tabs (Rosuvastatin Calcium) .... One Tablet By Mouth Once Daily 7)  Metoprolol Tartrate 50 Mg Tabs (Metoprolol Tartrate) .... One Tablet By Mouth Two Times A Day 8)  Etodolac 400  Mg Tabs (Etodolac) .... One Tablet By Mouth Two Times A Day 9)  Aspirin 81 Mg  Tabs (Aspirin) .... One Tablet By Mouth Once Daily 10)  Fish Oil   Oil (Fish Oil) .... One Tablet By Mouth Once Daily 11)  Milk Thistle 250 Mg Caps (Milk Thistle) .... One Tablet By Mouth Once Daily  Allergies (verified): 1)  ! Penicillin  Past History:  Past Medical History: L-Spine Disc Disease Cirrhosis ( ? 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 L-Spine Disc Surgery (04/1996) Sinus Surgery (1991) Adenosine Stress Myoview (09/16/2005)  Family History: No FH of Colon Cancer: Family History of Breast Cancer:Mother  Family History of Liver Cancer:Mother  Family History of Diabetes: Sister, PGM  Family History of Heart Disease: Multiple Members on both sides  Family History of Liver Disease: Mother   Social History: Occupation: [employer] Married Patient currently smokes.  Alcohol Use - no Daily Caffeine Use: 2 daily  Illicit Drug Use - no Drug Use:  no  Review of Systems       The patient complains of back pain.  The patient denies allergy/sinus, anemia, anxiety-new, arthritis/joint pain, blood in urine, breast changes/lumps, change in vision, confusion, cough, coughing up blood, depression-new, fainting, fatigue, fever, headaches-new, hearing problems, heart murmur, heart rhythm changes, itching, muscle pains/cramps, night sweats, nosebleeds, shortness of breath, skin rash, sleeping problems, sore  throat, swelling of feet/legs, swollen lymph glands, thirst - excessive, urination - excessive, urination changes/pain, urine leakage, vision changes, and voice change.    Vital Signs:  Patient profile:   50 year old male Height:      68 inches Weight:       217 pounds BMI:     33.11 BSA:     2.12 Pulse rate:   88 / minute Pulse rhythm:   regular BP sitting:   132 / 80  (left arm) Cuff size:   regular  Vitals Entered By: Hope Pigeon CMA (March 01, 2010 1:33 PM)  Physical Exam  General:  Well developed, well nourished, no acute distress. Head:  Normocephalic and atraumatic. Eyes:  Conjunctiva pink, no icterus.  Mouth:  No oral lesions. Tongue moist.  Neck:  no obvious masses  Lungs:  Clear throughout to auscultation. Heart:  Regular rate and rhythm; no murmurs, rubs,  or bruits. Abdomen:  Abdomen soft, nontender, nondistended. No obvious masses or hepatomegaly.Normal bowel sounds.  Msk:  Symmetrical with no gross deformities. Normal posture. Extremities:  No palmar erythema, no edema.  Neurologic:  Alert and  oriented x4;  grossly normal neurologically. Skin:  Intact without significant lesions or rashes. Cervical Nodes:  No significant cervical adenopathy. Psych:  Alert and cooperative. Normal mood and affect.   Impression & Recommendations:  Problem # 1:  INTESTINAL INFECTIONS DUE CLOSTRIDIUM DIFFICILE (ICD-008.45) Assessment New In ER few days ago.  I see his stool is positive for C-Difficile. Start Flagyl. Patient's abdominal exam is benign, he looks good, vitals are stable, and ER labs unremarkable. Discussed C-Diff precautions with patient and his wife. Follow up in a couple of weeks with Dr. Carlean Purl. In the meantime we will call patient for medical update early next week.   Problem # 2:  COLITIS, HX OF (ICD-V12.79) Assessment: Comment Only Lymphocytic colitis 2004  Patient Instructions: 1)  We have sent a perscription for Flagyl 500 MG to your pharmacy, [facility]. 2)  Increase your intake of fluids.  3)  We made you an appointment with Dr. Carlean Purl for a follow up for 04-02-10 at 9:30 AM.  4)  Please call us with a progress report on Mon or Tues of next week.  5)  The medication list was reviewed and  reconciled.  All changed / newly prescribed medications were explained.  A complete medication list was provided to the patient / caregiver. Prescriptions: FLAGYL 500 MG TABS (METRONIDAZOLE) Take 1 tab 3 times daily  #30 x 0   Entered by:   Marisue Humble NCMA   Authorized by:   Tye Savoy NP   Signed by:   Marisue Humble NCMA on 03/01/2010   Method used:   Electronically to        [facility] (retail)       [address]       [place], [place]  [address]       Ph: [phone]       Fax: [phone]   RxID:   [id number]

## 2011-01-28 NOTE — Progress Notes (Signed)
Summary: Triage   Phone Note Call from Patient Call back at Home Phone [phone]   Caller: Patient Call For: [name] [name] Reason for Call: Talk to Nurse Summary of Call: Pt is still have problems--diarrhea, abd pain wants to know if he can be seen Initial call taken by: Webb Laws,  March 25, 2010 10:58 AM  Follow-up for Phone Call        see if he could see me this PM Gatha Mayer MD, [facility]  March 25, 2010 1:54 PM

## 2011-01-28 NOTE — Letter (Signed)
Summary: Patient Notice- Polyp Results  [facility]  [address] [place], [place] [address]   Phone: [phone]  Fax: [phone]        March 28, 2010 MRN: [id number]    [name] [name] [place], [place]  [address]    Dear Mr. Coutts,  The polyps removed from your colon were adenomatous. This means that they were pre-cancerous or that  they had the potential to change into cancer over time.  I recommend that you have a repeat colonoscopy in 5 years to determine if you have developed any new polyps over time. If you develop any new rectal bleeding, abdominal pain or significant bowel habit changes, please contact us before then.  In addition to repeating colonoscopy, changing health habits may reduce your risk of having more colon polyps and possibly, colon cancer. You may lower your risk of future polyps and colon cancer by adopting healthy habits such as not smoking or using tobacco (if you do), being physically active, losing weight (if overweight), and eating a diet which includes fruits and vegetables and limits red meat.  As we discussed tonight, the biopsies showed an inflammatory colitis called lymphocytic colitis. I am hopeful that the prednisone will get this under control.  Please call us if you are having persistent problems or have questions about your condition that have not been fully answered at this time.  Sincerely,  Gatha Mayer MD, [facility]  This letter has been electronically signed by your physician.  Appended Document: Patient Notice- Polyp Results Letter mailed 4.5.11

## 2011-01-28 NOTE — Assessment & Plan Note (Signed)
Summary: F/U Diarrhea, rectal bleeding, saw NP    History of Present Illness Visit Type: Follow-up Visit Primary GI MD: Silvano Rusk MD [facility] Primary Provider: Shirline Frees, MD  Requesting Provider: n/a Chief Complaint: Diarrhea and abdominal pain History of Present Illness:   50 yo white man with recent diagnosis and treatnment of C. diff colitis. However he is still having 9-10 bowel movements daily including incontinence at night. Watery, explosive bowel movemens. Also rectal bleeding with most bowel movements. Abdomen is sore, diffusely. Not sleeping well.             Current Medications (verified): 1)  Nexium 40 Mg  Cpdr (Esomeprazole Magnesium) .... Take 1 By Mouth Qd 2)  Colchicine 0.6 Mg  Tabs (Colchicine) .Marland Kitchen.. 1 By Mouth Two Times A Day 3)  Allopurinol 100 Mg Tabs (Allopurinol) .... One Tablet By Mouth Once Daily 4)  Trazamine 50 Mg Misc (Trazodone & Diet Manage Prod) .... One Tablet By Mouth Once Daily 5)  Crestor 5 Mg Tabs (Rosuvastatin Calcium) .... One Tablet By Mouth Once Daily 6)  Metoprolol Tartrate 50 Mg Tabs (Metoprolol Tartrate) .... One Tablet By Mouth Two Times A Day 7)  Etodolac 400 Mg Tabs (Etodolac) .... One Tablet By Mouth Two Times A Day 8)  Aspirin 81 Mg  Tabs (Aspirin) .... One Tablet By Mouth Once Daily 9)  Fish Oil   Oil (Fish Oil) .... One Tablet By Mouth Once Daily 10)  Milk Thistle 250 Mg Caps (Milk Thistle) .... One Tablet By Mouth Once Daily 11)  Anusol-Hc 25 Mg Supp (Hydrocortisone Acetate) .... Take 1 Tab Twice Daily  X 10 Days 12)  Ventolin Hfa 108 (90 Base) Mcg/act Aers (Albuterol Sulfate) .Marland Kitchen.. 1 Puff Every 4 Hours  Allergies (verified): 1)  ! Penicillin  Past History:  Past Medical History: Reviewed history from 03/01/2010 and no changes required. L-Spine Disc Disease Cirrhosis ( ? cannot find evidence of this) GERD Gout Smoker Non-Cardiogenic (07/1996) Colonic polyps, hx of Hypertension ETOH (None since  1986) Dyslipidemia Lymphocytic Colitis (2004) NASH COPD Hyperlipidemia  Past Surgical History: Reviewed history from 08/25/2007 and no changes required. L-Spine Disc Surgery (04/1996) Sinus Surgery (1991) Adenosine Stress Myoview (09/16/2005)  Family History: Reviewed history from 03/01/2010 and no changes required. No FH of Colon Cancer: Family History of Breast Cancer:Mother  Family History of Liver Cancer:Mother  Family History of Diabetes: Sister, PGM  Family History of Heart Disease: Multiple Members on both sides  Family History of Liver Disease: Mother   Social History: Reviewed history from 03/01/2010 and no changes required. Occupation: Disabilty Married Patient currently smokes.  Alcohol Use - no Daily Caffeine Use: 2 daily  Illicit Drug Use - no  Review of Systems       The patient complains of back pain and shortness of breath.    Vital Signs:  Patient profile:   50 year old male Height:      68 inches Weight:      218 pounds BMI:     33.27 BSA:     2.12 Pulse rate:   90 / minute Pulse rhythm:   regular BP sitting:   122 / 84  (left arm)  Vitals Entered By: [place] Deborra Medina) (March 25, 2010 4:10 PM)  Physical Exam  General:  Well developed, well nourished, no acute distress. Eyes:  anicteric Lungs:  Clear throughout to auscultation. Heart:  Regular rate and rhythm; no murmurs, rubs,  or bruits. Abdomen:  soft, BS+, mildly tender  diffusely Rectal:  deferred until time of colonoscopy.   Psych:  Alert and cooperative. Normal mood and affect.   Impression & Recommendations:  Problem # 1:  DIARRHEA (ICD-787.91) Assessment Unchanged Persistent after a course of metronidazole. Given prior history of lymphocytic colitis and persistnece of problems, will pursue colonoscopy vs. flexible sigmoidoscopy without prep to sort out best treatment options.  Risks, benefits,and indications of endoscopic procedure(s) were reviewed with the patient and  all questions answered.  Orders: Colonoscopy (Colon)  Problem # 2:  RECTAL BLEEDING (ASU-015.3) Assessment: Unchanged Likely from hemorrhoids. Await endoscopic evaluation.  Patient Instructions: 1)  He was given colonoscopy instructions for tomorrow.

## 2011-01-28 NOTE — Progress Notes (Signed)
Summary: Triage   Phone Note Call from Patient Call back at Home Phone [phone]   Caller: Patient Call For: Dr. Carlean Purl Reason for Call: Lab or Test Results Summary of Call: Pt is calling about his results from his colonoscopy Initial call taken by: Webb Laws,  May 01, 2010 11:54 AM  Follow-up for Phone Call        Left message for patient to call back [place], CGRN  May 01, 2010 1:29 PM  Reviewed by results with the patient.  I have mailed him another copy of the letter for his records. Follow-up by: Barb Merino RN, CGRN,  May 02, 2010 9:33 AM

## 2011-01-28 NOTE — Assessment & Plan Note (Signed)
Summary: F/U rectal bleeding, diarrhea    History of Present Illness Visit Type: Follow-up Visit Primary GI MD: Silvano Rusk MD [facility] Primary Provider: Shirline Frees, MD  Requesting Provider: n/a Chief Complaint: follow-up rectal bleeding and diarrhea not any better. History of Present Illness:   Recently diagnosed with C-Diff. His BMs have decreased to a maximum of 8 a day but he feels tired. He continues to have diffuse abdominal discomfort but pain no longer constant and not as severe. Having some rectal bleeding. Thinks bleeding from hemorrhoids.  Mainly concerned about fatigue. Not taking Questran on regular basis, scared of side effects.  His weight is stable.    GI Review of Systems    Reports abdominal pain and  bloating.       Reports diarrhea and  rectal bleeding.      Current Medications (verified): 1)  Nexium 40 Mg  Cpdr (Esomeprazole Magnesium) .... Take 1 By Mouth Qd 2)  Colchicine 0.6 Mg  Tabs (Colchicine) .... Use Prn 3)  Flonase 50 Mcg/act  Susp (Fluticasone Propionate) 4)  Allopurinol 100 Mg Tabs (Allopurinol) .... One Tablet By Mouth Once Daily 5)  Trazamine 50 Mg Misc (Trazodone & Diet Manage Prod) .... One Tablet By Mouth Once Daily 6)  Crestor 5 Mg Tabs (Rosuvastatin Calcium) .... One Tablet By Mouth Once Daily 7)  Metoprolol Tartrate 50 Mg Tabs (Metoprolol Tartrate) .... One Tablet By Mouth Two Times A Day 8)  Etodolac 400 Mg Tabs (Etodolac) .... One Tablet By Mouth Two Times A Day 9)  Aspirin 81 Mg  Tabs (Aspirin) .... One Tablet By Mouth Once Daily 10)  Fish Oil   Oil (Fish Oil) .... One Tablet By Mouth Once Daily 11)  Milk Thistle 250 Mg Caps (Milk Thistle) .... One Tablet By Mouth Once Daily  Allergies (verified): 1)  ! Penicillin  Past History:  Past Medical History: Reviewed history from 03/01/2010 and no changes required. L-Spine Disc Disease Cirrhosis ( ? 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 L-Spine Disc Surgery (04/1996) Sinus Surgery (1991) Adenosine Stress Myoview (09/16/2005)  Family History: Reviewed history from 03/01/2010 and no changes required. No FH of Colon Cancer: Family History of Breast Cancer:Mother  Family History of Liver Cancer:Mother  Family History of Diabetes: Sister, PGM  Family History of Heart Disease: Multiple Members on both sides  Family History of Liver Disease: Mother   Social History: Reviewed history from 03/01/2010 and no changes required. Occupation: Disabilty Married Patient currently smokes.  Alcohol Use - no Daily Caffeine Use: 2 daily  Illicit Drug Use - no  Review of Systems       The patient complains of fatigue and shortness of breath.  The patient denies allergy/sinus, anemia, anxiety-new, arthritis/joint pain, back pain, blood in urine, breast changes/lumps, change in vision, confusion, cough, coughing up blood, depression-new, fainting, fever, headaches-new, hearing problems, heart murmur, heart rhythm changes, itching, muscle pains/cramps, night sweats, nosebleeds, skin rash, sleeping problems, sore throat, swelling of feet/legs, swollen lymph glands, thirst - excessive, urination - excessive, urination changes/pain, urine leakage, vision changes, and voice change.    Vital Signs:  Patient profile:   50 year old male Height:      68 inches Weight:      218 pounds BMI:     33.27 Pulse rate:  84 / minute Pulse rhythm:   regular BP sitting:   128 / 80  (left arm)  Vitals Entered By: Randye Lobo NCMA (March 12, 2010 1:52 PM)  Physical Exam  General:  Well developed, well nourished, no acute distress. Head:  Normocephalic and atraumatic. Eyes:  Conjunctiva pink, no icterus.  Mouth:  No oral lesions. Tongue  moist.  Lungs:  Clear throughout to auscultation. Heart:  Regular rate and rhythm; no murmurs, rubs,  or bruits. Abdomen:  Abdomen soft,  nondistended. Mild, diffuse lower abdominal tenderness. No obvious masses or hepatomegaly.Normal bowel sounds.  Rectal:  No obvious fissures. On anoscopy internal hemorrhoids were seen. Neurologic:  Alert and  oriented x4;  grossly normal neurologically. Psych:  Alert and cooperative. Normal mood and affect.   Impression & Recommendations:  Problem # 1:  INTESTINAL INFECTIONS DUE CLOSTRIDIUM DIFFICILE (ICD-008.45) Assessment Improved Low risk factors for C-Diff but he has it. Interestingly, had lymphocytic colitis in 2004. Doubt underlying IBD since BMS completely normal prior to C-Diff and no chronic GI complaints.  Completed Flagyl yesterday. Complains of fatigue but diarrhea much better, abdominal pain is overall improved. Today's labs are normal.  I would like to give him another week of Flagyl . Add Lomotil two times a day. He is scared to take Questran and Immodium bothered his gout. Follow up with Dr. Carlean Purl.   Problem # 2:  HEMORRHOIDS-INTERNAL (ICD-455.0) Start Anusol HC suppositories.  Problem # 3:  RECTAL BLEEDING (ICD-569.3) Assessment: New Likely related to hemorrhoids.   Patient Instructions: 1)  We have sent several perscriptions to your pharmacy. 2)  Lomotil two times a day, RX faxed to [facility]. 3)  Flagyl x 7 days, take as directed, sent electronically. 4)  Anusol Hc supp. two times a day 10 days, one refill, sent electronically. 5)  Follow up Low Fiber, low Residue diet for 7-10 days. Advance diet as tolerated. 6)  Copy sent to : Shirline Frees, Md 7)  The medication list was reviewed and reconciled.  All changed / newly prescribed medications were explained.  A complete medication list was provided to the patient / caregiver. Prescriptions: ANUSOL-HC 25 MG SUPP (HYDROCORTISONE ACETATE) Take 1 tab twice daily  x 10  days  #20 x 1   Entered by:   Marisue Humble NCMA   Authorized by:   Tye Savoy NP   Signed by:   Marisue Humble NCMA on 03/12/2010   Method used:   Electronically to        [facility] (retail)       [address]       [place], [place]  [address]       Ph: [phone]       Fax: [phone]   RxID:   [id number] LOMOTIL 2.5-0.025 MG TABS (DIPHENOXYLATE-ATROPINE) Take 1 tab twice daily  as needed for diarrhea  #20 x 0   Entered by:   Marisue Humble NCMA   Authorized by:   Tye Savoy NP   Signed by:   Marisue Humble NCMA on 03/12/2010   Method used:   Printed then faxed to ...       [facility]  [address] (retail)       [place], [place]  [address]       Ph: [phone]  Fax: [phone]   RxID:   [id number] FLAGYL 500 MG TABS (METRONIDAZOLE) Take 1 tab 3 times daily x 7 days  #21 x 0   Entered by:   Marisue Humble NCMA   Authorized by:   Tye Savoy NP   Signed by:   Marisue Humble NCMA on 03/12/2010   Method used:   Electronically to        [facility]  [address] (retail)       [address]       [place], [place]  [address]       Ph: [phone]       Fax: [phone]   RxID:   [id number]

## 2011-01-28 NOTE — Letter (Signed)
Summary: Unprepped Colonoscopy Instructions  [facility]  [place], [place] [address]   Phone: [phone]  Fax: [phone]       MAHKI SPIKES    07/13/1961    MRN: [id number]       Procedure Day Sudie Grumbling: Duke Salvia, 03/26/10     Arrival Time: 2:30 PM     Procedure Time: 3:30 PM     Location of Procedure:                    _X_ [facility] (4th Floor)  PREPARATION FOR COLONOSCOPY   On TUESDAY, 03/26/10 THE DAY OF THE PROCEDURE:  1.   No solid foods, milk or milk products beginning on 03/25/10.  2.   Do not drink anything colored red or purple.  Avoid juices with pulp.  No orange juice.  3.  You may drink clear liquids until 1:30 PM, which is 2 hours before your procedure.                                                                                                CLEAR LIQUIDS INCLUDE: Water Jello Ice Popsicles Tea (sugar ok, no milk/cream) Powdered fruit flavored drinks Coffee (sugar ok, no milk/cream) Gatorade Juice: apple, white grape, white cranberry  Lemonade Clear bullion, consomm, broth Carbonated beverages (any kind) Strained chicken noodle soup Hard Candy   MEDICATION INSTRUCTIONS  Unless otherwise instructed, you should take regular prescription medications with a small sip of water as early as possible the morning of your procedure.                  OTHER INSTRUCTIONS  You will need a responsible adult at least 51 years of age to accompany you and drive you home.   This person must remain in the waiting room during your procedure.  Wear loose fitting clothing that is easily removed.  Leave jewelry and other valuables at home.  However, you may wish to bring a book to read or an iPod/MP3 player to listen to music as you wait for your procedure to start.  Remove all body piercing jewelry and leave at home.  Total time from sign-in until discharge is approximately 2-3 hours.  You should go home  directly after your procedure and rest.  You can resume normal activities the day after your procedure.  The day of your procedure you should not:   Drive   Make legal decisions   Operate machinery   Drink alcohol   Return to work  You will receive specific instructions about eating, activities and medications before you leave.    The above instructions have been reviewed and explained to me by   _______________________    I fully understand and can verbalize these instructions _____________________________ Date _________

## 2011-01-28 NOTE — Progress Notes (Signed)
Summary: biopsy results: lymphocytic colitis  Medications Added COLCHICINE 0.6 MG  TABS (COLCHICINE) 1 by mouth two times a day PREDNISONE 10 MG  TABS (PREDNISONE) 4 tabs daily x 5 days, 3 tabs daily x 5 days, 2 tabs daily x 10 days, 1 tab daily then til you see Dr. Carlean Purl       Phone Note Outgoing Call   Summary of Call: I called colonoscopy results re: lymphocytic colitis. to start prednisone and he is to call for an appt in about 1 month.    New/Updated Medications: COLCHICINE 0.6 MG  TABS (COLCHICINE) 1 by mouth two times a day PREDNISONE 10 MG  TABS (PREDNISONE) 4 tabs daily x 5 days, 3 tabs daily x 5 days, 2 tabs daily x 10 days, 1 tab daily then til you see Dr. Carlean Purl Prescriptions: PREDNISONE 10 MG  TABS (PREDNISONE) 4 tabs daily x 5 days, 3 tabs daily x 5 days, 2 tabs daily x 10 days, 1 tab daily then til you see Dr. Carlean Purl  #100 x 0   Entered and Authorized by:   Gatha Mayer MD, [facility]   Signed by:   Gatha Mayer MD, [facility] on 03/28/2010   Method used:   Electronically to        [facility]  [address] (retail)       [address]       [place], [place]  [address]       Ph: [phone]       Fax: [phone]   RxID:   [id number]

## 2011-03-10 ENCOUNTER — Ambulatory Visit (INDEPENDENT_AMBULATORY_CARE_PROVIDER_SITE_OTHER): Payer: Medicare Other | Admitting: Cardiology

## 2011-03-10 DIAGNOSIS — E7800 Pure hypercholesterolemia, unspecified: Secondary | ICD-10-CM

## 2011-03-10 DIAGNOSIS — R079 Chest pain, unspecified: Secondary | ICD-10-CM

## 2011-03-10 DIAGNOSIS — I119 Hypertensive heart disease without heart failure: Secondary | ICD-10-CM

## 2011-03-10 DIAGNOSIS — Z79899 Other long term (current) drug therapy: Secondary | ICD-10-CM

## 2011-03-10 LAB — CBC
HCT: 44.7 % (ref 39.0–52.0)
Hemoglobin: 15.4 g/dL (ref 13.0–17.0)
MCH: 30.1 pg (ref 26.0–34.0)
MCHC: 34.5 g/dL (ref 30.0–36.0)
MCV: 87.5 fL (ref 78.0–100.0)
Platelets: 181 10*3/uL (ref 150–400)

## 2011-03-10 LAB — COMPREHENSIVE METABOLIC PANEL
ALT: 35 U/L (ref 0–53)
AST: 28 U/L (ref 0–37)
Albumin: 4.3 g/dL (ref 3.5–5.2)
Alkaline Phosphatase: 95 U/L (ref 39–117)
BUN: 10 mg/dL (ref 6–23)
CO2: 26 mEq/L (ref 19–32)
Calcium: 9.9 mg/dL (ref 8.4–10.5)
Creatinine, Ser: 0.75 mg/dL (ref 0.4–1.5)
GFR calc Af Amer: >60 mL/min (ref >60)
Potassium: 4.5 mEq/L (ref 3.5–5.1)
Total Bilirubin: 0.4 mg/dL (ref 0.3–1.2)
Total Protein: 6.4 g/dL (ref 6.0–8.3)

## 2011-03-10 LAB — DIFFERENTIAL
Basophils Absolute: 0 10*3/uL (ref 0.0–0.1)
Basophils Relative: 0 % (ref 0–1)
Lymphocytes Relative: 43 % (ref 12–46)
Monocytes Absolute: 0.6 10*3/uL (ref 0.1–1.0)
Neutro Abs: 4.3 10*3/uL (ref 1.7–7.7)
Neutrophils Relative %: 48 % (ref 43–77)

## 2011-03-10 LAB — PROTIME-INR: Prothrombin Time: 12.2 seconds (ref 11.6–15.2)

## 2011-03-10 LAB — APTT: aPTT: 27 seconds (ref 24–37)

## 2011-03-11 LAB — BASIC METABOLIC PANEL
BUN: 16 mg/dL (ref 6–23)
CO2: 23 mEq/L (ref 19–32)
Calcium: 10 mg/dL (ref 8.4–10.5)
Glucose, Bld: 97 mg/dL (ref 70–99)
Potassium: 4.1 mEq/L (ref 3.5–5.1)
Sodium: 138 mEq/L (ref 135–145)

## 2011-03-11 LAB — CBC
HCT: 45.7 % (ref 39.0–52.0)
Hemoglobin: 16.1 g/dL (ref 13.0–17.0)
MCH: 30.4 pg (ref 26.0–34.0)
MCHC: 35.2 g/dL (ref 30.0–36.0)
RDW: 12.6 % (ref 11.5–15.5)

## 2011-03-12 LAB — CBC
HCT: 40.2 % (ref 39.0–52.0)
HCT: 42.1 % (ref 39.0–52.0)
Hemoglobin: 14.4 g/dL (ref 13.0–17.0)
MCH: 29.3 pg (ref 26.0–34.0)
MCH: 29.9 pg (ref 26.0–34.0)
MCHC: 33.6 g/dL (ref 30.0–36.0)
MCHC: 34.2 g/dL (ref 30.0–36.0)
MCV: 87.3 fL (ref 78.0–100.0)
MCV: 87.4 fL (ref 78.0–100.0)
RDW: 12.3 % (ref 11.5–15.5)

## 2011-03-12 LAB — POCT CARDIAC MARKERS: Myoglobin, poc: 37.5 ng/mL (ref 12–200)

## 2011-03-12 LAB — URINALYSIS, ROUTINE W REFLEX MICROSCOPIC
Bilirubin Urine: NEGATIVE
Glucose, UA: NEGATIVE mg/dL
Hgb urine dipstick: NEGATIVE
Ketones, ur: NEGATIVE mg/dL
Nitrite: NEGATIVE
Protein, ur: NEGATIVE mg/dL
Specific Gravity, Urine: 1.015 (ref 1.005–1.030)
Urobilinogen, UA: 0.2 mg/dL (ref 0.0–1.0)
pH: 6.5 (ref 5.0–8.0)

## 2011-03-12 LAB — CARDIAC PANEL(CRET KIN+CKTOT+MB+TROPI)
CK, MB: 1.1 ng/mL (ref 0.3–4.0)
CK, MB: 1.2 ng/mL (ref 0.3–4.0)
Relative Index: INVALID (ref 0.0–2.5)
Relative Index: INVALID (ref 0.0–2.5)
Total CK: 81 U/L (ref 7–232)
Troponin I: <0.01 ng/mL (ref 0.00–0.06)

## 2011-03-12 LAB — DIFFERENTIAL
Basophils Absolute: 0 K/uL (ref 0.0–0.1)
Basophils Relative: 0 % (ref 0–1)
Eosinophils Absolute: 0.2 K/uL (ref 0.0–0.7)
Eosinophils Relative: 2 % (ref 0–5)
Lymphocytes Relative: 40 % (ref 12–46)
Lymphs Abs: 3.7 K/uL (ref 0.7–4.0)
Monocytes Absolute: 0.8 K/uL (ref 0.1–1.0)
Monocytes Relative: 9 % (ref 3–12)
Neutro Abs: 4.5 K/uL (ref 1.7–7.7)
Neutrophils Relative %: 49 % (ref 43–77)

## 2011-03-12 LAB — HEPARIN LEVEL (UNFRACTIONATED): Heparin Unfractionated: <0.1 IU/mL — ABNORMAL LOW (ref 0.30–0.70)

## 2011-03-12 LAB — HEPATIC FUNCTION PANEL
AST: 24 U/L (ref 0–37)
Albumin: 3.9 g/dL (ref 3.5–5.2)
Bilirubin, Direct: <0.1 mg/dL (ref 0.0–0.3)
Total Protein: 6.3 g/dL (ref 6.0–8.3)

## 2011-03-12 LAB — BASIC METABOLIC PANEL WITH GFR
BUN: 12 mg/dL (ref 6–23)
CO2: 29 meq/L (ref 19–32)
Calcium: 9.6 mg/dL (ref 8.4–10.5)
Chloride: 105 meq/L (ref 96–112)
Creatinine, Ser: 0.8 mg/dL (ref 0.4–1.5)
GFR calc non Af Amer: >60 mL/min
Glucose, Bld: 91 mg/dL (ref 70–99)
Potassium: 4.2 meq/L (ref 3.5–5.1)
Sodium: 141 meq/L (ref 135–145)

## 2011-03-12 LAB — LIPID PANEL
HDL: 33 mg/dL — ABNORMAL LOW
Total CHOL/HDL Ratio: 4.5 ratio
Triglycerides: 234 mg/dL — ABNORMAL HIGH
VLDL: 47 mg/dL — ABNORMAL HIGH (ref 0–40)

## 2011-03-12 LAB — PROTIME-INR
INR: 0.89 (ref 0.00–1.49)
Prothrombin Time: 12.2 s (ref 11.6–15.2)

## 2011-03-12 LAB — APTT: aPTT: 29 s (ref 24–37)

## 2011-03-12 LAB — TROPONIN I

## 2011-03-23 LAB — POCT I-STAT, CHEM 8
Chloride: 109 mEq/L (ref 96–112)
Glucose, Bld: 102 mg/dL — ABNORMAL HIGH (ref 70–99)
HCT: 50 % (ref 39.0–52.0)
Hemoglobin: 17 g/dL (ref 13.0–17.0)
Potassium: 4.7 mEq/L (ref 3.5–5.1)
Sodium: 140 mEq/L (ref 135–145)

## 2011-03-23 LAB — URINALYSIS, ROUTINE W REFLEX MICROSCOPIC
Hgb urine dipstick: NEGATIVE
Protein, ur: NEGATIVE mg/dL
Urobilinogen, UA: 0.2 mg/dL (ref 0.0–1.0)

## 2011-03-23 LAB — DIFFERENTIAL
Basophils Absolute: 0 10*3/uL (ref 0.0–0.1)
Lymphocytes Relative: 37 % (ref 12–46)
Lymphs Abs: 3 10*3/uL (ref 0.7–4.0)
Monocytes Absolute: 0.5 10*3/uL (ref 0.1–1.0)
Neutro Abs: 4.5 10*3/uL (ref 1.7–7.7)

## 2011-03-23 LAB — CBC
HCT: 41.7 % (ref 39.0–52.0)
Hemoglobin: 14.3 g/dL (ref 13.0–17.0)
RDW: 12.7 % (ref 11.5–15.5)
WBC: 8.1 10*3/uL (ref 4.0–10.5)

## 2011-03-23 LAB — CLOSTRIDIUM DIFFICILE EIA

## 2011-03-23 LAB — HEMOCCULT GUIAC POC 1CARD (OFFICE): Fecal Occult Bld: POSITIVE

## 2011-04-01 LAB — DIFFERENTIAL
Eosinophils Absolute: 0.1 10*3/uL (ref 0.0–0.7)
Eosinophils Relative: 1 % (ref 0–5)
Lymphs Abs: 2.8 10*3/uL (ref 0.7–4.0)
Monocytes Absolute: 0.6 10*3/uL (ref 0.1–1.0)
Monocytes Relative: 7 % (ref 3–12)

## 2011-04-01 LAB — CBC
HCT: 42.5 % (ref 39.0–52.0)
HCT: 43.1 % (ref 39.0–52.0)
HCT: 45.8 % (ref 39.0–52.0)
Hemoglobin: 15.1 g/dL (ref 13.0–17.0)
MCHC: 34.3 g/dL (ref 30.0–36.0)
MCV: 89.1 fL (ref 78.0–100.0)
MCV: 89.1 fL (ref 78.0–100.0)
MCV: 89.2 fL (ref 78.0–100.0)
Platelets: 179 10*3/uL (ref 150–400)
RBC: 4.34 MIL/uL (ref 4.22–5.81)
RBC: 4.77 MIL/uL (ref 4.22–5.81)
RBC: 5.14 MIL/uL (ref 4.22–5.81)
WBC: 10.6 10*3/uL — ABNORMAL HIGH (ref 4.0–10.5)
WBC: 8.3 10*3/uL (ref 4.0–10.5)
WBC: 8.5 10*3/uL (ref 4.0–10.5)
WBC: 9.8 10*3/uL (ref 4.0–10.5)

## 2011-04-01 LAB — COMPREHENSIVE METABOLIC PANEL
Albumin: 4.1 g/dL (ref 3.5–5.2)
BUN: 12 mg/dL (ref 6–23)
Calcium: 9.3 mg/dL (ref 8.4–10.5)
Chloride: 104 mEq/L (ref 96–112)
Creatinine, Ser: 0.83 mg/dL (ref 0.4–1.5)
Total Bilirubin: 0.9 mg/dL (ref 0.3–1.2)
Total Protein: 6.9 g/dL (ref 6.0–8.3)

## 2011-04-01 LAB — BASIC METABOLIC PANEL
CO2: 23 mEq/L (ref 19–32)
Chloride: 105 mEq/L (ref 96–112)
GFR calc Af Amer: >60 mL/min (ref >60)
GFR calc Af Amer: >60 mL/min (ref >60)
GFR calc non Af Amer: >60 mL/min (ref >60)
Potassium: 3.8 mEq/L (ref 3.5–5.1)
Potassium: 4 mEq/L (ref 3.5–5.1)
Sodium: 138 mEq/L (ref 135–145)

## 2011-04-01 LAB — CARDIAC PANEL(CRET KIN+CKTOT+MB+TROPI)
CK, MB: 1.7 ng/mL (ref 0.3–4.0)
Relative Index: 1.5 (ref 0.0–2.5)
Total CK: 113 U/L (ref 7–232)

## 2011-04-01 LAB — LIPID PANEL
Cholesterol: 190 mg/dL (ref 0–200)
HDL: 31 mg/dL — ABNORMAL LOW (ref >39)

## 2011-04-01 LAB — POCT CARDIAC MARKERS
CKMB, poc: <1 ng/mL — ABNORMAL LOW (ref 1.0–8.0)
Troponin i, poc: <0.05 ng/mL (ref 0.00–0.09)

## 2011-04-01 LAB — TROPONIN I: Troponin I: 0.01 ng/mL (ref 0.00–0.06)

## 2011-04-01 LAB — CK TOTAL AND CKMB (NOT AT ARMC)
CK, MB: 1.9 ng/mL (ref 0.3–4.0)
Relative Index: 1.9 (ref 0.0–2.5)
Total CK: 100 U/L (ref 7–232)

## 2011-04-01 NOTE — Discharge Summary (Signed)
  NAMEJABARRI, [name] [name]            ACCOUNT NO.:  [id number]  MEDICAL RECORD NO.:  [id number]          PATIENT TYPE:  LOCATION:                                 FACILITY:  PHYSICIAN:  Allister Lessley K. Howie Rufus, M.D.DATE OF BIRTH:  09/02/1961  DATE OF ADMISSION:  01/01/2011 DATE OF DISCHARGE:  01/02/2011                              DISCHARGE SUMMARY   FINAL DIAGNOSES: 1. 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 2. Essential hypertension. 3. History of transient ischemic attacks and cerebrovascular accident. 4. Hyperlipidemia. 5. Coronary artery disease. 6. Gout. 7. Chronic back pain with gait disorder. 8. Asthma. 9. Cirrhosis. 10.History of pancreatitis. 11.History of insomnia. 12.History of hemorrhoids. 13.History of ongoing tobacco use.  OPERATIONS PERFORMED:  Left posterior communicating artery measuring 4.3 mm x 3.7 mm by Dr. Luanne Bras under general anesthesia performed on January 01, 2011.  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  In comparison with other studies performed in 2009, it was felt that this aneurysm had increased in size.  The patient was also having TIAs at that time.  The patient had met in consultation with Dr. Estanislado Pandy and elected for endovascular treatment. The patient underwent procedure with the Interventional Radiology team. The patient was extubated without difficulty after post imaging revealed complete obliteration of the aneurysm.  The patient was sent to the neuro intensive care unit overnight and treated with IV heparin, which he tolerated well with no complications.  His femoral artery sheath was removed and hemostasis was obtained.  The patient's neurological status remained stable without any signs of cerebrovascular accident or hemorrhage.  The patient was  administered a dose of Plavix and his routine daily medications while admitted into the hospital.  The patient is scheduled for discharge later today and to continue on his Plavix on a daily basis with followup on an outpatient basis in approximately 2 weeks' time.  LABORATORY STUDIES:  At the time of discharge, his white cell count was 7.4, red blood cells 4.70, hemoglobin was 14.1, hematocrit 41.3, platelet count of 164.  Sodium was 140, potassium 4.0, BUN was 8, creatinine was 0.73 with a glucose of 111.  OUTPATIENT MEDICATIONS:  The patient is to resume all his home medications.  He is to resume on his 81 mg aspirin and is to continue a 75 mg Plavix until seen in clinic in approximately 2 weeks.  CONDITION UPON DISCHARGE:  Stable.     Park Liter, P.A.   ______________________________ Fritz Pickerel. Estanislado Pandy, M.D.    PC/MEDQ  D:  03/18/2011  T:  03/19/2011  Job:  [id number]  Electronically Signed by Park Liter P.A. on 03/20/2011 09:11:46 AM Electronically Signed by Luanne Bras M.D. on 03/24/2011 01:43:16 PM

## 2011-05-13 NOTE — Consult Note (Signed)
[name] [name], [name] [name]            ACCOUNT NO.:  [id number]   MEDICAL RECORD NO.:  [id number]          PATIENT TYPE:  INP   LOCATION:  8101                         FACILITY:  [facility]   PHYSICIAN:  Burton Apley, M.D.  DATE OF BIRTH:  04/20/1961   DATE OF CONSULTATION:  11/17/2008  DATE OF DISCHARGE:                                 CONSULTATION   CHIEF COMPLAINT:  Headaches, syncope, and left-sided weakness/numbness.   HISTORY OF PRESENT ILLNESS:  [name] [name] is a 50 year old man with a  history of hypertension, GERD, and chronic back pain who presented to  the [facility] Emergency Department on November 17, 2008 with complaints  of a 3-day headache and his blood pressure being elevated.  Mr.  [name] checked his blood pressure at home this morning and it was  189/116.  Giving that he was concerned about his headache that had  persisted for 3 days, he presented to the [facility] Emergency  Department.  He was setting in registration when he had a witnessed  syncopal event.  Per the medical report, this syncope lasted for 1  minute, however, his wife states that it lasted 10 minutes.  There was  no witnessed seizure activity or incontinence noticed.  When the patient  came back to himself, he woke up with left-sided weakness and  paresthesias.  He also complains that he feels a ball in his throat.  He also had brief chest pressure when he regained consciousness which  has resolved since then.   PAST MEDICAL HISTORY:  1. Questionable history of 3 TIAs, 10 or 15 years ago.  The patient      was never worked up or admitted for his symptoms.  He explains that      he was told by a physician at work that he had had mini strokes.  2. Chronic back pain status post L5-S1 laminectomy in September 2006.  3. GERD.  4. Gout.  5. Colon polyposis status post polypectomy in October 2003.  6. Hemorrhoids status post banding in 2003.  7. Hypertension.   MEDICATIONS:  1. Lisinopril 20 mg p.o.  daily.  2. Nexium 40 mg p.o. daily.  3. Percocet 5/325 mg p.o. b.i.d. p.r.n.  4. Trazodone 50 mg p.o. at bedtime p.r.n.  5. Lyrica 75 mg p.o. t.i.d.  6. Allopurinol 100 mg p.o. daily.   ALLERGIES:  PENICILLIN.   FAMILY HISTORY:  His father died of lung cancer at age 33.  He had a CVA  in his late 41s and also had hypertension and diabetes.  His mother also  died of lung cancer at age 37.  She had hypertension.  The patient's  sisters have hypertension and diabetes.   SOCIAL HISTORY:  [name] [name] has not worked in the past 9 years and  has been on disability for the last 3 years because of his chronic back  pain.  He used to work at [employer].  He is married and has 2 grown  sons.  One daughter-in-law works on the cardiac unit at [employer].  He has been smoking 1 pack per day for  the last 33 years.  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  She  denies any illicit drug use.   REVIEW OF SYSTEMS:  NEUROLOGIC:  The patient denies any syncope,  headache, and numbness or weakness other than today.  CARDIOVASCULAR:  No chest pain, heart palpitations, or dyspnea on exertion (the patient  cares for checking on a daily basis).  PULMONARY:  No dyspnea, cough,  wheezing, or phlegm production.  GI:  No nausea, vomiting, abdominal  pain, rectal bleeding, or hematochezia.  The patient complains of having  a ball in his throat.  URINARY:  No incontinence, dysuria, or  hematuria.  GENERAL:  No weight loss, weight gain, fevers, or night  sweats.  HEME:  No bruising or bleeding.  PSYCH:  No anxiety or  depression.   PHYSICAL EXAMINATION:  VITAL SIGNS:  Temperature 97.9, blood pressure on  arrival 190/100, down to 152/91 at time of evaluation, heart rate 66,  respiratory rate 18, and O2 sat 99% on room air.  HEENT:  Poor dentition.  Dentures in place.  Oropharynx clear.  Moist  mucous membranes.  LUNGS:  Clear to auscultation bilaterally with good air  movement.  NECK:  Supple.  No masses.  No carotid bruit.  CARDIOVASCULAR:  Regular rate and rhythm.  No murmurs, rubs, or gallops.  ABDOMEN:  Bowel sounds positive, soft, nontender, and nondistended.  No  palpable masses or organomegaly.  SKIN:  Intact, warm, and dry.  NEUROLOGIC:  The patient is awake, alert, and oriented x3.  Cranial  nerves II through XII intact and symmetrical except for a mild decrease  in pinprick sensation on the left side of his face.  Strength is 5-/5 on  the left lower and left upper extremities with drift.  Decreased  sensation to pinprick on the whole left side of the body.  Deep tendon  reflexes 2/4 throughout with toes downgoing bilaterally on plantar  stimulation.  No ataxia or dysarthria.  Of note, the patient does not  read much, so this cannot necessarily be used for his neurological  evaluation.  NIH stroke scale of 3.   LABORATORY DATA:  White blood count 8.8, hemoglobin 16.7 with an MCV of  90, RDW 12.7, and platelets 199.  PT 12.1 and PTT 27.   IMAGING:  Head CT scan, no acute findings.   ASSESSMENT AND PLAN:  A 50 year old man with uncontrolled hypertension  and a questionable history of transient ischemic attacks presenting with  a 3-day headache who had a syncopal event followed by left-sided  weakness and numbness in the emergency department.  Head CT scan  negative.  The patient will be admitted by a hospitalist team (he has an  [place]).  He will be worked up for a transient ischemic attack:  We will get a brain and neck MRI/MRA, a 2-D echo, fasting lipid profile,  and homocystine level as well as a urine drug screen.  The differentiation also include syncopy.  An acute coronary syndrome  will also be ruled out with serial cardiac enzymes given his history of  chest pressure.  Of note, his EKG did not show any signs of ischemia.  Stroke education treatment and current plan was discussed with the  patient and his family.  He will  not be given any IV tPA given that his  symptoms are mild and have improved since they occurred.      Burton Apley, M.D.  Electronically Signed     Burton Apley, M.D.  Electronically Signed    MC/MEDQ  D:  11/17/2008  T:  11/17/2008  Job:  [id number]

## 2011-05-13 NOTE — H&P (Signed)
[name] [name], [name] [name]            ACCOUNT NO.:  [id number]   MEDICAL RECORD NO.:  [id number]          PATIENT TYPE:  INP   LOCATION:  3846                         FACILITY:  [facility]   PHYSICIAN:  Sheila Oats, M.D.DATE OF BIRTH:  04/06/1961   DATE OF ADMISSION:  11/17/2008  DATE OF DISCHARGE:  11/18/2008                              HISTORY & PHYSICAL   PRIMARY CARE PHYSICIAN:  Azalia Bilis, M.D.   CHIEF COMPLAINT:  Syncope.   HISTORY OF PRESENT ILLNESS:  The patient is a 50 year old white male  with past medical history significant for hypertension, gout, chronic  low back pain and recently started on Lyrica at that pain clinic who  presents with the above complaints.  He states that he has been having  headaches and dizziness for 3 days and today the headaches got very  severe, very intense pressure in his frontal area.  He thought it might  be his sinuses because he has had nasal congestion and a postnasal drip,  but because of the intensity of the headaches he decided he would go to  the fire department to have his blood pressure checked.  He went to the  fire department but there was no one there so he came back home.  The  headaches were persisting and also he began to have some chest pressure  described as 5-6/10 in intensity, midsternal in location and that lasted  about 3 hours, and so he decided to come to the ER.  As he was sitting,  in the process of getting registered in the triage area, he states that  he just suddenly felt very bad and the next thing he knew he had passed  out.  Per nursing staff and his wife who witnessed this episode, the  patient was unconscious for a few minutes and then they were able to  arouse him thereafter.  When he came to, he had left-sided weakness and  numbness and his wife indicated that his speech was slurred.  With these  findings a code stroke was called and Neurology saw the patient.  His  left-sided weakness resolved soon  thereafter and his speech was back to  baseline.  Neurology ordered an MRI, MRA, 2-D echocardiogram,  homocysteine level and a urine drug screen.  They recommended that the  patient be admitted for observation on the hospitalist service.  At the  time of my interview the patient's chest pain had resolved.  Also he did  not have any further weakness or numbness.  On arrival to the ER the  patient's blood pressure initially was 190/100 and subsequently improved  on recheck to 118/68 without any medications.   PAST MEDICAL HISTORY:  As above.   MEDICATIONS:  1. Lisinopril 20 mg p.o. daily.  2. Nexium 40 mg p.o. b.i.d.  3. Allopurinol 100 mg p.o. daily.  4. Percocet 5/325 mg p.r.n.  5. Lyrica 75 mg t.i.d.   ALLERGIES:  PENICILLIN.   SOCIAL HISTORY:  Positive for tobacco, one pack a day for about 33  years.  He states that he is a recovering alcoholic and has not  had any  alcohol in 16 years.   FAMILY HISTORY:  His dad had an MI in his 94s, died of lung cancer.  His  mother had metastatic lung cancer a and his sisters diabetes and  hypertension.   REVIEW OF SYSTEMS:  As per HPI, other review of systems negative.   PHYSICAL EXAM:  IN GENERAL:  The patient is a middle-aged white male,  looks older than stated age, in no apparent distress.  VITAL SIGNS:  His blood pressure is 118/68, initially 190/100.  His  pulse is 69, respiratory rate of 18 and O2 saturation is 98%.  HEENT:  PERRL, EOMI, sclerae anicteric, moist mucous membranes, no  facial droop.  NECK:  Supple, no adenopathy, no thyromegaly or JVD and no carotid  bruits appreciated.  LUNGS:  Clear to auscultation bilaterally.  No crackles or wheezes.  CARDIOVASCULAR:  Regular rate and rhythm.  Normal S1, S2.  ABDOMEN:  Soft, bowel sounds present, nontender, nondistended.  No  organomegaly and no masses palpable.  EXTREMITIES:  No cyanosis and no edema.  NEURO:  He is alert and oriented x3.  Cranial nerves II-XII grossly   intact.  His strength is 4-5/5 and symmetric.  His plantar reflexes are  equivocal.   CT scan of head - no acute intracranial abnormality.  Mucosal thickening  noted bilateral ethmoid and left sphenoid sinus.  MRI of brain - no evidence of acute ischemia.  Brain signal within  normal limits.  Moderate to mildly severe pansinusitis changes.  MRA  shows no occlusions, stenosis or dissections.  Focal less than 2 mm of  prominence between the origins of the superior and inferior divisions of  the left middle cervical artery.  This may represent avascular loop or a  small aneurysm.   ASSESSMENT/PLAN:  1. Syncope.  As discussed above, MRI of head negative for stroke.  We      will hold Lyrica.  We will obtain serial cardiac enzymes, monitor      on telemetry, obtain a two-dimensional echocardiogram and a D-      dimer, follow and further evaluate as appropriate.  2. Chest pain.  Obtain serial cardiac enzymes, place on aspirin,      nitroglycerin.  Continue PPI.  Obtain a D-dimer as above and      follow.  3. Left-sided weakness.  Now resolved., unclear etiology.  MRI      negative for cerebrovascular accident.  Neurology following.  4. Uncontrolled hypertension.  Improved without medications on      recheck.  Follow and continue outpatient medications.  5. Pansinusitis, moderate to severe.  Empiric antibiotics.  Also add      Claritin and follow.  6. Chronic low back pain.  Continue outpatient pain medications except      hold Lyrica as above as it has known side effects of dizziness,      headaches.  7. History of gout.  Continue allopurinol.  8. Gastroesophageal reflux disease.  Continue proton pump inhibitor.      Sheila Oats, M.D.  Electronically Signed     ACV/MEDQ  D:  11/18/2008  T:  11/18/2008  Job:  [id number]   cc:   Azalia Bilis, M.D.

## 2011-05-16 NOTE — H&P (Signed)
NAME:  [name] [name], [name] [name]                      ACCOUNT NO.:  [id number]   MEDICAL RECORD NO.:  [id number]                   PATIENT TYPE:  INP   LOCATION:  0107                                 [facility]ACILITY:  [facility]   PHYSICIAN:  Delfin Edis, MD [facility]                 DATE OF BIRTH:  09-09-1961   DATE OF ADMISSION:  10/12/2002  DATE OF DISCHARGE:                                HISTORY & PHYSICAL   CHIEF COMPLAINT:  Abdominal discomfort and rectal bleeding.   HISTORY OF PRESENT ILLNESS:  The patient is a  pleasant 50 year old white  male known to Dr. Loanne Drilling and also to Dr. Carlean Purl.  He has a history of  hypertension and GERD and had been evaluated in July 2003, after an episode  of rectal bleeding.  He underwent colonoscopy with Dr. Carlean Purl in July 2003,  showing multiple small polyps, which were removed.  No evidence of  diverticulosis.  He was noted to have grade 2 internal hemorrhoids which  were not felt to be bleeding.  The patient says he has been doing very well  in the interim.  Had not had any recurrent bleeding and always has normal  bowel movements.  He had onset yesterday with some generalized abdominal and  a stomach ache which he thought perhaps was the onset of a stomach virus.  He did not have any associated nausea or vomiting, no fever or chills, but  says that when he came home from work last night he just felt weak and  drained.  He awakened this morning at his usual time, had urge for bowel  movement, and says that he passed a bowel movement and a large gush of  bright red blood.  He then had four further episodes of urgency and bright  red blood per rectum prior to presenting to the emergency room.  He had no  diaphoresis, nausea, or vomiting this morning, and no significant abdominal  cramping, though he continues to complain of a rather generalized abdominal  discomfort.  He was seen and evaluated in the emergency room and found to be  hemodynamically stable.  CBC  showed a WBC of 9.1, hemoglobin of 15.6,  hematocrit of 44.9, MCV of 87.5.  Protime 11.5, INR 0.8.  Electrolytes  within normal limits.  LFTs normal.  Lipase 24.  The patient is admitted to  the hospital for observation, serial H&Hs, CT scan of the abdomen and  pelvis, and possible flexible sigmoidoscopy for further diagnostic  evaluation.  He presents with an acute lower GI bleed which does not sound  hemorrhoidal and, given diffuse abdominal discomfort, there is concern for  an acute colitis or perhaps an ischemic colitis.   CURRENT MEDICATIONS:  1. Enalapril 5 mg p.o. q.d.  2. Prilosec 20 p.o. q.d.   ALLERGIES:  PENICILLIN.   PAST MEDICAL HISTORY:  1. Hypertension.  2. GERD.  3. Colon polyps.  SOCIAL HISTORY:  The patient is married.  He is employed full-time in the  [employer].  He is a smoker.  No ETOH.   FAMILY HISTORY:  Pertinent for father with lung cancer.  No family history  of colon cancer or polyps that he is aware of or inflammatory bowel disease.   REVIEW OF SYSTEMS:  Reviewed and completely negative other than above.  The  patient has not been on any regular aspirin or NSAIDs.   PHYSICAL EXAMINATION:  GENERAL:  Well-developed, healthy-appearing white  male in no acute distress.  He is alert and oriented x 3.  VITAL SIGNS:  Blood pressure on admission 150/89, pulse in the 70s,  respirations 16, temperature 97.8.  HEENT:  Normocephalic, atraumatic.  EOMI.  PERRLA.  Sclerae anicteric.  NECK:  Supple.  Without nodes.  No JVD.  CARDIOVASCULAR:  Regular rate and rhythm.  With S1, S2.  No significant  murmur, rub, or gallop.  LUNGS:  Clear to A&P.  ABDOMEN:  Soft.  Rather diffusely mildly tender.  There is no guarding or  rebound.  No palpable mass or hepatosplenomegaly.  Bowel sounds are active  to hyperactive.  No bruit heard.  RECTAL:  Reveals mucousy brown stool which is heme-positive.  There is no  gross blood on my exam.  No palpable lesion.   EXTREMITIES:  Without clubbing, cyanosis, or edema.  NEUROLOGIC:  Grossly nonfocal.   IMPRESSION:  1. The patient is a 50 year old white male with generalized abdominal     discomfort and hematochezia, etiology not clear.  Rule out diverticular     source with spasm.  Rule out acute colitis, possibly ischemic.  Rule out     Meckel's diverticulum, though unusual to present with abdominal     discomfort.  2. Status post recent colonoscopy with finding of colon polyps and internal     hemorrhoids July 2003.  3. Gastroesophageal reflux disease.  4. Hypertension.   PLAN:  The patient is admitted to the service of Dr. Delfin Edis for IV  fluid hydration, bowel rest, serial H&Hs.  Will obtain CT scan of the  abdomen and pelvis to evaluate for bowel edema, and consider flexible  sigmoidoscopy.  For details, please see the orders.     Nicoletta Ba, P.A.-C. [facility]                Delfin Edis, MD [facility]    AE/MEDQ  D:  10/12/2002  T:  10/12/2002  Job:  [id number]   cc:   Hilliard Clark A. Loanne Drilling, M.D. [facility]

## 2011-05-16 NOTE — Op Note (Signed)
NAME:  [name] [name], [name] [name]                      ACCOUNT NO.:  [id number]   MEDICAL RECORD NO.:  [id number]                   PATIENT TYPE:  INP   LOCATION:  7017                                 FACILITY:  [facility]   PHYSICIAN:  Delfin Edis, MD [facility]                 DATE OF BIRTH:  05/12/1961   DATE OF PROCEDURE:  10/13/2002  DATE OF DISCHARGE:                                 OPERATIVE REPORT   PROCEDURE:  Colonoscopy.   INDICATIONS:  This 50 year old African-American male was admitted yesterday  with large-volume hematochezia and diffuse abdominal pain.  It was not clear  whether this was due to ischemic colitis.  He had recent history of internal  hemorrhoids and history of colon polyps, with colonoscopy in July 2003.  Because of continued abdominal pain suggestive of acute colitis, he was  admitted and is undergoing flexible sigmoidoscopy to rule out ischemia.   INSTRUMENT:  Olympus single-channel videoscope.   SEDATION:  Versed 5 mg IV, Demerol 100 mg IV.   DESCRIPTION OF PROCEDURE:  Olympus single-channel videoscope passed under  direct vision through the rectum to the sigmoid colon.  The patient was  monitored by pulse oximetry.  Oxygen saturations were normal.  Anal canal  showed first-grade hemorrhoid disease.  There were three swollen bluish  hemorrhoids with discoloration but no active bleeding.  There was no fissure  or any stigmata of recent bleeding.  There was no blood in the anal canal,  and as the endoscope traversed through the anal canal back down __________  there was no bleeding.  Rectal ampulla contained multiple diminutive polyps.  At least six or seven of them were ablated for biopsies.  There were also a  few diminutive polyps at 5 and 10 cm and one at 20 cm.  These were all sent  to pathology in one specimen container.  There were no diverticula in the  left colon.  The entire colon was clean.  There was no blood, old or fresh.  Colonoscope passed easily  through the splenic flexure where the mucosa  appeared normal.  There was no bleeding.  At this point we did not have any  site for the patient's bleeding and, therefore, colonoscopy was extended to  examine the transverse colon, the hepatic flexure, ascending colon to the  cecum, but there was no sign of bleeding including cecal pouch and ileocecal  valve.  The colonoscope was then retracted, the colon decompressed.  The  patient tolerated the procedure well.   IMPRESSION:  1. No evidence of active or recent bleeding.  2. First-grade hemorrhoids.  3. Multiple diminutive polyps of the rectosigmoid and sigmoid colon status     post polypectomies.   PLAN:  It is not clear as to the etiology of the patient's hematochezia.  From the normal blood count and clean colon, I would suspect that the  hemorrhoids were bleeding, and he will be  treated, therefore, with Anusol-HC  suppositories and stool softener.  He will also be able to advance his diet.  He will follow up with his gastroenterologist for follow-up colonoscopies in  the future.                                               Delfin Edis, MD [facility]    DB/MEDQ  D:  10/13/2002  T:  10/13/2002  Job:  [id number]

## 2011-05-16 NOTE — Discharge Summary (Signed)
NAME:  [name] [name], [name] [name]                      ACCOUNT NO.:  [id number]   MEDICAL RECORD NO.:  [id number]                   PATIENT TYPE:  INP   LOCATION:  6527                                 FACILITY:  [facility]   PHYSICIAN:  Biagio Borg, M.D. [facility]             DATE OF BIRTH:  11/26/1961   DATE OF ADMISSION:  05/13/2004  DATE OF DISCHARGE:  05/14/2004                                 DISCHARGE SUMMARY   DISCHARGE DIAGNOSES:  1. Chest pain, atypical.  2. Hypertension.  3. Gastroesophageal reflux disease.  4. Chronic low back pain, attending the pain clinic with an implantable     stimulator.   HISTORY AND PHYSICAL:  Please see the dictated date of admission, May 13, 2004.   PROCEDURE:  None.   CONSULTS:  None.   HOSPITAL COURSE:  [name] [name] is a very nice 50 year old white male who  suffered onset of nausea, emotional stress, and severe substernal chest  pressure radiating to the back associated with some weakness.  He has been  on Nexium for two weeks with ongoing GERD.  He is has had an off and on  course of his PPI.  No known history of esophageal spasm or other esophagus  problems documented in the past.  He is otherwise compliant with  medications.  He was admitted with sudden pain that seemed to resolve with  nitroglycerin in the ambulance on the way to the ER.  Cardiac enzymes have  proved negative.  Telemetry was sinus rhythm.  He remained afebrile.  Vital  signs stable including blood pressure.  I's and O's were adequate __________  ambulating, eating well, no further chest discomfort.  Cardiac enzymes  negative.  Telemetry normal.  He was felt to have gained maximum benefit  from this hospitalization and observation.  No medication changes were  applied.   DISPOSITION:  Discharged to home in good condition, except he will actually  go directly to [facility] for stress Cardiolite scheduled for 9 this  morning.   MEDICATIONS:  As outpatient, including:  1.  Nexium 40 mg p.o. every day.  2. Enalapril 5 mg p.o. every day.  3. Neurontin 900 mg p.o. t.i.d.  4. Motrin 800 mg t.i.d.   There are no activity or dietary restrictions.  I suspect, from the history,  his discomfort was most likely gastrointestinal probably esophageal spasm  with resolution with the nitroglycerin.   He will follow up with Dr. Loanne Drilling, primary care physician, in 1-2 weeks.                                                Biagio Borg, M.D. [facility]    JWJ/MEDQ  D:  05/14/2004  T:  05/14/2004  Job:  [id number]   cc:   Hilliard Clark A.  Loanne Drilling, M.D. [facility]

## 2011-05-16 NOTE — Discharge Summary (Signed)
NAME:  [name] [name], [name] [name]                      ACCOUNT NO.:  [id number]   MEDICAL RECORD NO.:  [id number]                   PATIENT TYPE:  INP   LOCATION:  2563                                 FACILITY:  [facility]   PHYSICIAN:  Sandy Salaam. Deatra Ina, M.D. [facility]          DATE OF BIRTH:  1961/12/14   DATE OF ADMISSION:  07/04/2003  DATE OF DISCHARGE:  07/06/2003                                 DISCHARGE SUMMARY   ADMISSION DIAGNOSIS:  1. A 50 year old white male with a five week history of abdominal pain and     diarrhea with associated weight loss, rule out infectious colitis,     possible Clostridium difficile colitis verus inflammatory bowel disease,     rule out irritable bowel syndrome, though unusual with weight loss.  2. History of colon polyps and internal hemorrhoids, again status post     banding.  3. Gastroesophageal reflux disease.  4. Hypertension.  5. Chronic lumbar back pain with degenerative disk disease, followed at the     [facility] in [place].   DISCHARGE DIAGNOSES:  1. Probable infectious colitis/enteritis, symptomatically improving.     Biopsies of colon pending.  2. History of colon polyps and internal hemorrhoids, again status post     banding.  3. Gastroesophageal reflux disease.  4. Hypertension.  5. Chronic lumbar back pain with degenerative disk disease, followed at the     Pain Clinic in [place].   CONSULTATIONS:  None.   PROCEDURES:  1. Colonoscopy per Dr. Deatra Ina.  2. CT scan of the abdomen and pelvis.  3. Small-bowel follow-through.   HISTORY OF PRESENT ILLNESS:  Paz is a 50 year old white male known to Dr.  Carlean Purl and also Dr. Loanne Drilling with a history of hematochezia for which he had  undergone colonoscopy in December 2003.  He was found to have internal  hemorrhoids at that time, and underwent a banding procedure.  He also had  upper endoscopy for complaints of epigastric pain, and this was a negative  exam.  At that time, he was felt to  have functional etiology of his  complaints of abdominal pain which were nonspecific.  History is as  otherwise outlined above.   At this time, he complains of onset about five weeks ago with increasing mid-  abdominal pain and diarrhea, and is now to the point that he is having six  to eight bowel movements per day which have been loose.  He says that she  has a lot of urgency postprandially and he has been afraid to eat as he has  to run to the bathroom.  He has had a subsequent weight loss of  approximately 10 pounds, has been unaware of any fever or chills.  He has  had some nausea, no vomiting, and continues to see a small amount of bright  red blood on the tissue when he has several bowel movements in a short  period of time.  He  had been evaluated in the ER on June 17, 2003, by the ER  physician.  Baseline labs were done which were negative, and he has been  given a 10 day course of an antibiotic, but did not notice any improvement  in his symptoms, and in fact actually thinks that he has progressed at this  point.  He called our office with increased complaints of pain and diarrhea,  and was advised to come to the ER for evaluation.  He was seen and evaluated  and admitted for further diagnostic evaluation.   ADMISSION LABORATORY DATA:  WBC 9.6, hemoglobin 15.8, hematocrit 45.3, MCV  85.6, platelets 243.  Stool for occult blood negative.  Sedimentation rate  1.  Electrolytes within normal limits.  BUN 10, creatinine 1, albumin 4.6.  LFT's normal.  Lipase 17.  CK and troponin negative x1.  TSH 1.77.  Stool  for WBC none seen.  Stool for C. diff negative.  Stool culture negative.  Giardia screen negative.   X-RAY STUDIES:  CT scan of the abdomen and pelvis on July 04, 2003, shows  diffuse mild diffuse colonic wall thickening likely representing a mild  diffuse infectious or inflammatory colitis.  The remainder of the exam was  unremarkable.  Plain abdominal films on admission were  negative.  Chest x-  ray showed central interstitial disease with some bronchitic changes.   HOSPITAL COURSE:  The patient was admitted to the service of Dr. Deatra Ina who  was covering the hospital.  Stool cultures were obtained.  He was placed on  a liquid diet and underwent CT scan of the abdomen and pelvis with findings  as outlined above.  Because of his complaints and CT findings of mild  diffuse wall thickening, it was decided that colonoscopy was indicated,  again for diagnostic purposes.  He had already been treated with an  outpatient course of Cipro and had failed.  We also empirically placed him  on Flagyl.  Colonoscopy per Dr. Deatra Ina on July 05, 2003, was negative.  Random biopsies were taken to rule out a microscopic colitis, in fact, did  return showing chronic colitis consistent with lymphocytic colitis.  These  results did not return until after the patient had been discharged from the  hospital.  By July 06, 2003, he was feeling better, had not had any diarrhea  in the previous 12 hours, had undergone small-bowel follow-through which  showed some mild dilation of the distal duodenum, no obstruction, and the  remainder of the exam was unremarkable.  There is no copy of the small-bowel  follow-through in his chart at the time of this dictation.  He was  discharged to home with instructions to follow up with Dr. Carlean Purl in the  office on July 18, 2003, at 3:45 p.m., and to call for any problems in the  interim.   DIET:  He was to maintain a low residue diet.   DISCHARGE MEDICATIONS:  1. Flagyl 250 mg q.i.d. x2 weeks.  2. Asacol 400 mg three p.o. t.i.d.  3. Robinul Forte 2 mg b.i.d.  4. Enalapril 5 mg daily as previous.  5. Prilosec 20 mg p.o. daily as previous.   CONDITION ON DISCHARGE:  Stable and improved.     Nicoletta Ba, P.A.-C. [facility]                Robert D. Deatra Ina, M.D. [facility]   AE/MEDQ  D:  07/12/2003  T:  07/12/2003  Job:  [id number]   cc:  Gatha Mayer, M.D.  [facility]

## 2011-05-16 NOTE — Op Note (Signed)
[name] [name], [name] [name] NO.:  [id number]   MEDICAL RECORD NO.:  [id number]          PATIENT TYPE:  OUT   LOCATION:  CATS                         FACILITY:  [place]   PHYSICIAN:  Ophelia Charter, M.D.DATE OF BIRTH:  September 27, 1961   DATE OF PROCEDURE:  DATE OF DISCHARGE:  09/15/2005                                 OPERATIVE REPORT   PREOPERATIVE DIAGNOSIS:  L5-S1 degenerative disk disease, lumbago, lumbar  radiculopathy.   POSTOPERATIVE DIAGNOSIS:  L5-S1 degenerative disk disease, lumbago, lumbar  radiculopathy.   OPERATION PERFORMED:  Redo L5 laminectomy; L5-S1 posterior lumbar interbody  fusion; insertion of interbody prosthesis (Capstone PEEK cages); posterior  nonsegmental instrumentation with Legacy titanium pedicle screws and rods;  posterolateral arthrodesis at L5-S1 with local morcellized autograft bone  and Vitoss bone graft extender.   SURGEON:  Ophelia Charter, M.D.   ASSISTANT:  Marchia Meiers. Vertell Limber, M.D.   ANESTHESIA:  General endotracheal.   ESTIMATED BLOOD LOSS:  300 mL.   SPECIMENS:  None.   DRAINS:  None.   COMPLICATIONS:  None.   INDICATIONS FOR PROCEDURE:  The patient is a 50 year old white male who was  involved in a work related injury years ago.  He was treated extensively  nonsurgically including time, rest, medications, physical therapy, steroid  injections and even a spinal cord stimulator.  He failed these measures and  continued to have persistent back pain.  Lumbar MRI demonstrated he had disk  degeneration at L5-S1 and he went on to have discogram which confirmed  concordant pain at L5-S1.  I discussed the various treatment options with  him including surgery.  The patient weighed the risks, benefits and  alternatives to surgery and decided to proceed with L5-S1 fusion.   DESCRIPTION OF PROCEDURE:  The patient was brought to the operating room by  the anesthesia team.  General endotracheal anesthesia was induced.  The  patient  was turned to the prone position on the Wilson frame.  His  lumbosacral region was then prepared with Betadine scrub and Betadine  solution and sterile drapes were applied.  I then injected the area to be  incised with Marcaine with epinephrine solution.  I used a scalpel to make a  midline incision over the L5-S1 interspace.  I used electrocautery to  perform a bilateral subperiosteal dissection exposing the bilateral spinous  process and lamina of L4, 5, and the upper sacrum.  We obtained  intraoperative radiograph to confirm our location.  We then inserted the  [employer] Endoscopy retractor for exposure.  We incised the interspinous ligament at  L4-5 and 5-1 with a scalpel and then used the Leksell rongeur to remove the  L5 spinous process and part of the L5 lamina.  We saved this bone and later  cleared it of soft tissue and morcellized it to use in the fusion process.  We used a high speed drill to perform bilateral L5 laminotomies.  There was  the expected epidural scarring/fibrosis at L5-S1 on the left from his prior  operation.  We carefully freed up the thecal sac from the scar tissue and  then completed the  L5 laminectomy using the Kerrison punch removing the  ligamentum flavum at L4-5 and L5-S1 and performing foraminotomy about the  bilateral L5-S1 nerve root.  We then incised the bilateral L5-S1  intervertebral disk with a 15 blade scalpel and performed aggressive  diskectomy using pituitary forceps and Epstein and Scoville curettes.  We  prepared for the posterior lumbar interbody fusion by clearing the soft  tissue from the vertebral end plates at O2-V0 and then distracting the  interspace and inserted a 12 x 26 mm Capstone PEEK cage bilaterally which  had been prefilled with local morcellized autograft bone and Vitoss bone  graft extender.  We filled in medially with Vitoss and autograft as well  completing the posterior lumbar interbody fusion.   We now turned our attention to the  posterior nonsegmented instrumentation.  Under fluoroscopic guidance, we cannulated bilateral L5-S1 pedicles with  bone probes, we tapped the pedicles with 5.5 mm taps.  We probed inside the  tapped pedicles with a ball probe to rule out cortical breeches.  We then  inserted 6.5 x 50 mm pedicle screws bilaterally at L5 and 6.5 x 40 mm Legacy  pedicle screws at S1 under fluoroscopic guidance. We palpated along the  medial aspect of the bilateral L5-S1 nerve root and noted that there were no  cortical breeches and the bilateral L5-S1 nerve roots were not injured.  We  connected the unilateral pedicle screws with a lordotic rod, mildly  compressed the construct and then secured the rods in place with the caps  which we tightened appropriately completing the instrumentation.   We now turned our attention to the posterolateral arthrodesis.  We used a  high speed drill to decorticate the remainder of the L5-S1 facet joint and  laid a combination of local morcellized autograft bone and Vitoss bone graft  extender over the decorticated posterolateral structures completing the  posterolateral arthrodesis.   We then inspected the thecal sac and the bilateral L5-S1 nerve roots and  noted they were well decompressed.  We then obtained stringent hemostasis  using bipolar electrocautery.  I then reapproximated the patient's  thoracolumbar fascia with interrupted #1 Vicryl sutures, subcutaneous tissue  with interrupted 2-0 Vicryl suture and the skin with Steri-Strips and  benzoin. The wound was then coated with bacitracin ointment, sterile  dressing was applied, the drapes were removed.  The patient was subsequently  returned to supine position where he was extubated by the anesthesia team  and transported to the post anesthesia care unit in stable condition.  All  sponge, needle and instrument counts were correct at the end of the case.     Ophelia Charter, M.D.  Electronically Signed      JDJ/MEDQ  D:  09/17/2005  T:  09/18/2005  Job:  [id number]

## 2011-05-16 NOTE — H&P (Signed)
NAME:  [name] [name], [name] [name]                      ACCOUNT NO.:  [id number]   MEDICAL RECORD NO.:  [id number]                   PATIENT TYPE:  INP   LOCATION:  7939                                 FACILITY:  [facility]   PHYSICIAN:  Sandy Salaam. Deatra Ina, M.D. [facility]          DATE OF BIRTH:  1961-09-04   DATE OF ADMISSION:  07/04/2003  DATE OF DISCHARGE:                                HISTORY & PHYSICAL   CHIEF COMPLAINT:  Abdominal pain and diarrhea.   HISTORY:  The patient is a 50 year old white male known to Gatha Mayer,  M.D. [facility] and also Sean A. Loanne Drilling, M.D. [facility] with history of hematochezia for  which he underwent evaluation in December of 2003.  He was found to have  internal hemorrhoids and underwent banding procedure with Gatha Mayer,  M.D. [facility].  He also had upper endoscopy in December for complaints of  epigastric pain and this was normal.  He was felt to have a functional  abdominal pain.  The patient has been maintained on Prilosec for reflux  symptoms.  His other medical problems include hypertension, prior history of  colon polyps, and chronic low back pain for which he had been on disability  recently and has now returned to work.  He relates that he has degenerative  disk disease, has undergone remote lumbar laminectomy and is set up with the  Pain Clinic in [place] to have placement of a nerve stimulator device  within the next couple of weeks.   The patient at this time had onset about five weeks ago with increase in mid  abdominal pain and diarrhea and is now having six to eight bowel movements  per day, all loose.  He says he has a lot of urgency postprandially and has  been afraid to eat as he has to run to the bathroom.  He has had a  subsequent weight loss of about 10 pounds.  He is unaware of any fever or  chills.  Has had some mild nausea, no vomiting, and has seen occasional  small amounts of bright red blood on the tissue when he has had several  bowel  movements that day.  He also notes a lot of mucus in his stool.   The patient had come to the ER on June 19 and was evaluated by the ER and  had baseline laboratories done which were negative and was given a 10-day  course of an antibiotic which he does not remember the name of and has since  completed and told that he probably had an infection.  He has not noted any  improvement in his symptoms and actually thinks that he has progressed at  this point.  He called our office on the day of admission complaining of  increased diarrhea and pain and was advised to come to the ER for  evaluation.  He is seen and evaluated.  Laboratories show a WBC of  9.6,  hemoglobin 15.8, hematocrit 45.3.  Electrolytes within normal limits.  LFTs  normal.  Lipase 17.  KUB with a negative gas pattern.  He is admitted for  further diagnostic evaluation to rule out infectious colitis versus possible  inflammatory bowel disease.   CURRENT MEDICATIONS:  1. Enalapril 5 mg daily.  2. Prilosec 20 daily.  3. Vicodin two to three per day for his chronic back pain.   ALLERGIES:  PENICILLIN which causes hives.   PAST MEDICAL HISTORY:  1. Hypertension.  2. GERD.  3. Colon polyps.  4. Internal hemorrhoids.  5. Remote lumbar laminectomy.  6. Chronic lumbar degenerative disk disease and chronic low back pain.   FAMILY HISTORY:  Pertinent for cancers.  He is uncertain of any specific  types.  Father deceased with lung cancer and also had history of colon  polyps.   SOCIAL HISTORY:  The patient is married.  He is employed.  He is a smoker  one pack per day.  No ETOH.   REVIEW OF SYSTEMS:  Positive for chronic low back pain followed at the Pain  Clinic in [place].  Otherwise negative review of systems except for GI as  above.   PHYSICAL EXAMINATION:  GENERAL:  Well-developed white male in no acute  distress.  He is alert and oriented x3.  VITAL SIGNS:  Temperature 98.3, blood pressure 136/94, pulse 100,   respirations 88.  HEENT:  Normocephalic, atraumatic.  EOMI.  PERLA.  Sclerae anicteric.  NECK:  Supple without nodes.  CARDIOVASCULAR:  Regular rate and rhythm with S1 and S2, slightly tachy.  PULMONARY:  Clear to A&P.  ABDOMEN:  Soft.  Bowel sounds are active.  He is tender in the epigastrium,  also in the right upper quadrant, right mid quadrant.  There is no mass.  No  guarding or rebound.  No hepatosplenomegaly.  RECTAL:  Heme-negative trace stool.  EXTREMITIES:  Without clubbing, cyanosis, edema.  NEUROLOGIC:  Nonfocal.   IMPRESSION:  1. A 50 year old white male with five week history of abdominal pain and     diarrhea with associated weight loss.  Rule out infectious colitis,     possible Clostridium difficile.  Rule out inflammatory bowel disease.     Rule out irritable bowel syndrome, though unusual to have weight loss.     Rule out celiac disease.  2. History of colon polyps and internal hemorrhoids, again, status post     banding.  3. Gastroesophageal reflux disease.  4. Hypertension.  5. Chronic lumbar back pain with degenerative disk disease followed at the     Pain Clinic in [place].   PLAN:  The patient is admitted to the service of Sandy Salaam. Deatra Ina, M.D. [facility]  for IV fluid hydration, CT scan of the abdomen and pelvis.  If this is  negative we will proceed with small bowel follow through.  Will check  sedimentation rate, celiac panel, stool for C&S, O&P, C. difficile, and  stool for wbc's.  Begin a trial of Robinul Forte b.i.d. and started  empirically on Flagyl 250 q.i.d.  For further details please see the orders.     Nicoletta Ba, P.A.-C. [facility]                Robert D. Deatra Ina, M.D. [facility]    AE/MEDQ  D:  07/05/2003  T:  07/05/2003  Job:  [id number]

## 2011-06-04 ENCOUNTER — Other Ambulatory Visit (HOSPITAL_COMMUNITY): Payer: Self-pay | Admitting: Interventional Radiology

## 2011-06-04 DIAGNOSIS — I729 Aneurysm of unspecified site: Secondary | ICD-10-CM

## 2011-06-10 ENCOUNTER — Ambulatory Visit (HOSPITAL_COMMUNITY)
Admission: RE | Admit: 2011-06-10 | Discharge: 2011-06-10 | Disposition: A | Discharge status: Home / Self Care | Payer: Medicare Other | Source: Ambulatory Visit | Attending: Interventional Radiology | Admitting: Interventional Radiology

## 2011-06-10 ENCOUNTER — Other Ambulatory Visit (HOSPITAL_COMMUNITY): Payer: Self-pay | Admitting: Interventional Radiology

## 2011-06-10 DIAGNOSIS — F17200 Nicotine dependence, unspecified, uncomplicated: Secondary | ICD-10-CM | POA: Insufficient documentation

## 2011-06-10 DIAGNOSIS — R209 Unspecified disturbances of skin sensation: Secondary | ICD-10-CM | POA: Insufficient documentation

## 2011-06-10 DIAGNOSIS — I671 Cerebral aneurysm, nonruptured: Secondary | ICD-10-CM

## 2011-06-10 DIAGNOSIS — I729 Aneurysm of unspecified site: Secondary | ICD-10-CM

## 2011-06-10 DIAGNOSIS — K7460 Unspecified cirrhosis of liver: Secondary | ICD-10-CM | POA: Insufficient documentation

## 2011-06-10 DIAGNOSIS — I10 Essential (primary) hypertension: Secondary | ICD-10-CM | POA: Insufficient documentation

## 2011-06-10 DIAGNOSIS — I2510 Atherosclerotic heart disease of native coronary artery without angina pectoris: Secondary | ICD-10-CM | POA: Insufficient documentation

## 2011-06-10 DIAGNOSIS — J45909 Unspecified asthma, uncomplicated: Secondary | ICD-10-CM | POA: Insufficient documentation

## 2011-06-10 DIAGNOSIS — M549 Dorsalgia, unspecified: Secondary | ICD-10-CM | POA: Insufficient documentation

## 2011-06-10 DIAGNOSIS — G8929 Other chronic pain: Secondary | ICD-10-CM | POA: Insufficient documentation

## 2011-06-10 DIAGNOSIS — Z7982 Long term (current) use of aspirin: Secondary | ICD-10-CM | POA: Insufficient documentation

## 2011-06-10 LAB — CBC
HCT: 38.3 % — ABNORMAL LOW (ref 39.0–52.0)
Hemoglobin: 13.3 g/dL (ref 13.0–17.0)
MCH: 30.1 pg (ref 26.0–34.0)
MCHC: 34.7 g/dL (ref 30.0–36.0)
MCV: 86.7 fL (ref 78.0–100.0)
RBC: 4.42 MIL/uL (ref 4.22–5.81)

## 2011-06-10 LAB — POCT I-STAT, CHEM 8
BUN: 14 mg/dL (ref 6–23)
Calcium, Ion: 1.07 mmol/L — ABNORMAL LOW (ref 1.12–1.32)
Chloride: 110 mEq/L (ref 96–112)
Glucose, Bld: 100 mg/dL — ABNORMAL HIGH (ref 70–99)

## 2011-06-10 MED ORDER — IOHEXOL 300 MG/ML  SOLN
150.0000 mL | Freq: Once | INTRAMUSCULAR | Status: AC | PRN
  Administered 2011-06-10: 45 mL via INTRAVENOUS

## 2011-09-30 LAB — CBC
HCT: 49.4
Hemoglobin: 16.7
MCV: 89.7
Platelets: 199
RBC: 5.5
WBC: 8.8

## 2011-09-30 LAB — CARDIAC PANEL(CRET KIN+CKTOT+MB+TROPI)
CK, MB: 1.7
CK, MB: 1.9
Relative Index: 1.4
Relative Index: 1.5
Total CK: 123
Troponin I: 0.01
Troponin I: <0.01

## 2011-09-30 LAB — BASIC METABOLIC PANEL
BUN: 14
CO2: 25
Chloride: 102
Glucose, Bld: 89
Potassium: 3.7

## 2011-09-30 LAB — HOMOCYSTEINE: Homocysteine: 10.2

## 2011-09-30 LAB — COMPREHENSIVE METABOLIC PANEL
ALT: 33
AST: 27
Albumin: 4.6
CO2: 24
Calcium: 10.3
Chloride: 105
Creatinine, Ser: 0.79
GFR calc Af Amer: >60
GFR calc non Af Amer: >60
Sodium: 141
Total Bilirubin: 0.7

## 2011-09-30 LAB — LIPID PANEL
Cholesterol: 180
LDL Cholesterol: 82
VLDL: 77 — ABNORMAL HIGH

## 2011-09-30 LAB — DIFFERENTIAL
Eosinophils Absolute: 0.1
Eosinophils Relative: 2
Lymphocytes Relative: 42
Lymphs Abs: 3.7
Monocytes Absolute: 0.6

## 2011-09-30 LAB — URINALYSIS, ROUTINE W REFLEX MICROSCOPIC
Glucose, UA: NEGATIVE
Nitrite: NEGATIVE
Specific Gravity, Urine: 1.007
pH: 6.5

## 2011-09-30 LAB — RAPID URINE DRUG SCREEN, HOSP PERFORMED
Barbiturates: NOT DETECTED
Benzodiazepines: NOT DETECTED
Cocaine: NOT DETECTED
Opiates: NOT DETECTED

## 2011-09-30 LAB — GLUCOSE, CAPILLARY: Glucose-Capillary: 101 — ABNORMAL HIGH

## 2011-09-30 LAB — TROPONIN I: Troponin I: <0.01

## 2011-09-30 LAB — CK TOTAL AND CKMB (NOT AT ARMC): Relative Index: 1.4

## 2012-03-11 ENCOUNTER — Telehealth: Payer: Self-pay | Admitting: Internal Medicine

## 2012-03-11 NOTE — Telephone Encounter (Signed)
I have left a message for the Enid Derry and have rescheduled the appt to 03/16/12 10:15.  I have asked her to contact the patient

## 2012-03-15 ENCOUNTER — Encounter: Payer: Self-pay | Admitting: *Deleted

## 2012-03-15 ENCOUNTER — Encounter: Payer: Self-pay | Admitting: Internal Medicine

## 2012-03-16 ENCOUNTER — Encounter: Payer: Self-pay | Admitting: Internal Medicine

## 2012-03-16 ENCOUNTER — Ambulatory Visit (INDEPENDENT_AMBULATORY_CARE_PROVIDER_SITE_OTHER): Payer: Medicare Other | Admitting: Internal Medicine

## 2012-03-16 VITALS — BP 148/84 | HR 90 | Ht 69.0 in | Wt 223.0 lb

## 2012-03-16 DIAGNOSIS — K648 Other hemorrhoids: Secondary | ICD-10-CM

## 2012-03-16 DIAGNOSIS — Z860100 Personal history of colon polyps, unspecified: Secondary | ICD-10-CM

## 2012-03-16 MED ORDER — HYDROCORTISONE 2.5 % RE CREA: TOPICAL_CREAM | Freq: Two times a day (BID) | RECTAL | Status: AC

## 2012-03-16 NOTE — Patient Instructions (Addendum)
You have been given a separate informational sheet regarding your tobacco use, the importance of quitting and local resources to help you quit. You have been scheduled for an appointment with _________ at [facility]. Your appointment is on ______ at _______. Please arrive at ________ for registration. Make certain to bring a list of current medications, including any over the counter medications or vitamins. Also bring your co-pay if you have one as well as your insurance cards. [facility] is located at [address]. Should you need to reschedule your appointment, please contact them at [phone]. You have been given a hemorrhoid handout today. We have sent the following medications to your pharmacy for you to pick up at your convenience: Proctosol cream

## 2012-03-16 NOTE — Progress Notes (Signed)
Subjective:    Patient ID: [name] [name], male    DOB: [birth year], 51 y.o.   MRN: [id number]  HPI This pleasant 51 year old white man presents for evaluation of rectal bleeding thought to be due to hemorrhoids. He is describing a flareup over the last month where he is seeing bright red blood on the toilet paper and occasionally into the toilet. He moves his bowels 3-4 times a day, his bowels remain soft or loose, this is chronic. He is not sitting and straining for stool. This is similar problems he has had in the past. He has used Preparation H but it is not really made a difference. There is some mild pain with defecation and afterwards. He is helping for some definitive therapy as this is a chronic recurrent problem over the years. At last colonoscopy 2011 I did not comment on hemorrhoids but he has had them in the past. I had ligated internal hemorrhoids in 2004. He presented for an attempted or planned hemorrhoidal ligation in 2006, however the hemorrhoids were very small and near the dentate line so I elected not to ligate them.  Allergies  Allergen Reactions  . Penicillins    Outpatient Prescriptions Prior to Visit  Medication Sig Dispense Refill  . allopurinol (ZYLOPRIM) 100 MG tablet Take 100 mg by mouth daily.      Marland Kitchen esomeprazole (NEXIUM) 40 MG capsule Take 40 mg by mouth 2 (two) times daily.      Marland Kitchen losartan (COZAAR) 100 MG tablet Take 100 mg by mouth daily.      . metoprolol (LOPRESSOR) 50 MG tablet Take 50 mg by mouth 2 (two) times daily.      . Milk Thistle 500 MG CAPS Take 1 capsule by mouth daily.      . naproxen (NAPROSYN) 500 MG tablet Take 500 mg by mouth 2 (two) times daily as needed.      . nitroGLYCERIN (NITROSTAT) 0.4 MG SL tablet Place 0.4 mg under the tongue every 5 (five) minutes as needed.      . Omega-3 Fatty Acids (FISH OIL) 1000 MG CAPS Take 1 capsule by mouth daily.      . traZODone (DESYREL) 50 MG tablet Take 50 mg by mouth at bedtime.      Marland Kitchen aspirin 81 MG  tablet Take 81 mg by mouth daily.      Marland Kitchen ibuprofen (ADVIL,MOTRIN) 200 MG tablet Take 4 tablets by mouth twice daily       Past Medical History  Diagnosis Date  . GERD (gastroesophageal reflux disease)   . Gout   . Adenomatous colon polyp 2009  . Lymphocytic colitis 2009  . COPD (chronic obstructive pulmonary disease)   . Hypertension   . NASH (nonalcoholic steatohepatitis)   . Hyperlipidemia   . TIA (transient ischemic attack)   . Asthma   . Alcohol abuse     abstinent since 1992  . Hemorrhoids   . Chronic back pain    Past Surgical History  Procedure Date  . Lumbar spine surgery     x 2  . Nasal sinus surgery   . 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 Colonoscopy multiple  . Band hemorrhoidectomy 2003    at sigmoidoscopy   History   Social History  . Marital Status: Married    Spouse Name: N/A    Number of Children: 3  . Years of Education: N/A   Occupational History  . Disabled    Social History Main  Topics  . Smoking status: Current Everyday Smoker -- 1.0 packs/day for 41 years    Types: Cigarettes  . Smokeless tobacco: Never Used  . Alcohol Use: No  . Drug Use: No    Family History  Problem Relation Age of Onset  . Liver disease Mother   . Liver cancer Mother   . Colon cancer Neg Hx   . Heart disease      multiple family members on maternal and paternal side of family  . Breast cancer Mother   . Diabetes Sister   . Diabetes Paternal Grandmother         Review of Systems Positive for fatigue, is due for a sleep apnea study. Chronic low back pain, on disability for this.    Objective:   Physical Exam General:  NAD - overweight Eyes:   anicteric Abdomen:  soft and nontender, BS+, no HSM/mass Rectal:  Anoderm shows a few tiny hyperkeratotic nodules, normal resting tone, small amount of soft brown stool without mass. The exam is not significantly tender as DRE and anoscopy well tolerated. Anoscopy: Edematous and erythematous hemorrhoids - 3  columns in the distal anal canal with hypervascularity Ext:   no edema    Data Reviewed:   Office notes from Dr. Kenton Kingfisher, dated 03/08/2012. Normal comprehensive metabolic panel and a normal CBC with hemoglobin 15.7. TSH was also normal.       Assessment & Plan:   1. Hemorrhoids, internal, with bleeding     These have been an intermittent problem over the years. He is hoping for definitive therapy. Given there location near the dentate line in the past, I deferred ligation. He then improved but is now having recurrent problems.  Plan:  #1 hydrocortisone cream to be used for the time being. #2 hemorrhoid pamphlet and education provided #3 surgical referral for evaluation and treatment of hemorrhoids to see if he is a candidate for more definitive therapy.   Note that he does have chronic loose stools. I have never officially diagnosed him with IBS but he may have this or chronic lymphocytic colitis (would not treat based upon symptoms he has now).

## 2012-03-17 ENCOUNTER — Encounter: Payer: Self-pay | Admitting: Internal Medicine

## 2012-04-01 ENCOUNTER — Encounter (INDEPENDENT_AMBULATORY_CARE_PROVIDER_SITE_OTHER): Payer: Self-pay | Admitting: General Surgery

## 2012-04-01 ENCOUNTER — Ambulatory Visit (INDEPENDENT_AMBULATORY_CARE_PROVIDER_SITE_OTHER): Payer: Medicare Other | Admitting: General Surgery

## 2012-04-01 VITALS — BP 118/84 | HR 80 | Temp 98.0°F | Resp 16 | Ht 69.0 in | Wt 220.0 lb

## 2012-04-01 DIAGNOSIS — K648 Other hemorrhoids: Secondary | ICD-10-CM

## 2012-04-01 NOTE — Patient Instructions (Signed)
You are  bleeding from internal hemorrhoids. Dr. Dalbert Batman injected a medicine into the hemorrhoids today which should stop the bleeding. Read the patient information booklet that you were given. Return To see Dr. Dalbert Batman if the bleeding does not resolve within 3-4 weeks. If the bleeding stops and then nothing further needs to be done.  Hemorrhoids Hemorrhoids are enlarged (dilated) veins around the rectum. There are 2 types of hemorrhoids, and the type of hemorrhoid is determined by its location. Internal hemorrhoids occur in the veins just inside the rectum.They are usually not painful, but they may bleed.However, they may poke through to the outside and become irritated and painful. External hemorrhoids involve the veins outside the anus and can be felt as a painful swelling or hard lump near the anus.They are often itchy and may crack and bleed. Sometimes clots will form in the veins. This makes them swollen and painful. These are called thrombosed hemorrhoids. CAUSES Causes of hemorrhoids include:  Pregnancy. This increases the pressure in the hemorrhoidal veins.   Constipation.   Straining to have a bowel movement.   Obesity.   Heavy lifting or other activity that caused you to strain.  TREATMENT Most of the time hemorrhoids improve in 1 to 2 weeks. However, if symptoms do not seem to be getting better or if you have a lot of rectal bleeding, your caregiver may perform a procedure to help make the hemorrhoids get smaller or remove them completely.Possible treatments include:  Rubber band ligation. A rubber band is placed at the base of the hemorrhoid to cut off the circulation.   Sclerotherapy. A chemical is injected to shrink the hemorrhoid.   Infrared light therapy. Tools are used to burn the hemorrhoid.   Hemorrhoidectomy. This is surgical removal of the hemorrhoid.  HOME CARE INSTRUCTIONS   Increase fiber in your diet. Ask your caregiver about using fiber supplements.   Drink  enough water and fluids to keep your urine clear or pale yellow.   Exercise regularly.   Go to the bathroom when you have the urge to have a bowel movement. Do not wait.   Avoid straining to have bowel movements.   Keep the anal area dry and clean.   Only take over-the-counter or prescription medicines for pain, discomfort, or fever as directed by your caregiver.  If your hemorrhoids are thrombosed:  Take warm sitz baths for 20 to 30 minutes, 3 to 4 times per day.   If the hemorrhoids are very tender and swollen, place ice packs on the area as tolerated. Using ice packs between sitz baths may be helpful. Fill a plastic bag with ice. Place a towel between the bag of ice and your skin.   Medicated creams and suppositories may be used or applied as directed.   Do not use a donut-shaped pillow or sit on the toilet for long periods. This increases blood pooling and pain.  SEEK MEDICAL CARE IF:   You have increasing pain and swelling that is not controlled with your medicine.   You have uncontrolled bleeding.   You have difficulty or you are unable to have a bowel movement.   You have pain or inflammation outside the area of the hemorrhoids.   You have chills or an oral temperature above 102 F (38.9 C).  MAKE SURE YOU:   Understand these instructions.   Will watch your condition.   Will get help right away if you are not doing well or get worse.  Document Released: 12/12/2000  Document Revised: 12/04/2011 Document Reviewed: 04/18/2008 [facility] Patient Information 2012 [place].

## 2012-04-01 NOTE — Progress Notes (Signed)
Patient ID: [name] [name], male   DOB: May 02, 1961, 51 y.o.   MRN: [id number]  Chief Complaint  Patient presents with  . Hemorrhoids    HPI [name] [name] is a 51 y.o. male.  He is referred to me by Dr. Silvano Rusk for management of bleeding internal hemorrhoids.  The patient has loose bowel movements chronically 3-4 a day. He has had low-volume bright red blood per rectum with stools for many years. He has some intermittent discomfort. He states he had a hemorrhoid banding in 2004. He had a colonoscopy in 2011. He recently saw Dr. Katha Cabal and was referred for our management. HPI  Past Medical History  Diagnosis Date  . GERD (gastroesophageal reflux disease)   . Gout   . Adenomatous colon polyp 2009  . Lymphocytic colitis 2009  . COPD (chronic obstructive pulmonary disease)   . Hypertension   . NASH (nonalcoholic steatohepatitis)   . Hyperlipidemia   . TIA (transient ischemic attack)   . Asthma   . Alcohol abuse     abstinent since 1992  . Hemorrhoids   . Chronic back pain   . Personal history of colonic polyps 07/20/2002    Diminutive adenomas (3) 06/2002 diminutive adenoma (1) 2011     Past Surgical History  Procedure Date  . Lumbar spine surgery     x 2  . Nasal sinus surgery   . 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 Colonoscopy multiple  . Band hemorrhoidectomy 2003    at sigmoidoscopy    Family History  Problem Relation Age of Onset  . Liver disease Mother   . Liver cancer Mother   . Colon cancer Neg Hx   . Heart disease      multiple family members on maternal and paternal side of family  . Breast cancer Mother   . Diabetes Sister   . Diabetes Paternal Grandmother     Social History History  Substance Use Topics  . Smoking status: Current Everyday Smoker -- 1.0 packs/day for 41 years    Types: Cigarettes  . Smokeless tobacco: Never Used  . Alcohol Use: No    Allergies  Allergen Reactions  . Penicillins     Current Outpatient  Prescriptions  Medication Sig Dispense Refill  . allopurinol (ZYLOPRIM) 100 MG tablet Take 100 mg by mouth daily.      Marland Kitchen aspirin 325 MG tablet Take 325 mg by mouth daily.      Marland Kitchen esomeprazole (NEXIUM) 40 MG capsule Take 40 mg by mouth 2 (two) times daily.      Marland Kitchen losartan (COZAAR) 100 MG tablet Take 100 mg by mouth daily.      . metoprolol (LOPRESSOR) 50 MG tablet Take 50 mg by mouth 2 (two) times daily.      . Milk Thistle 500 MG CAPS Take 1 capsule by mouth daily.      . naproxen (NAPROSYN) 500 MG tablet Take 500 mg by mouth 2 (two) times daily as needed.      . nitroGLYCERIN (NITROSTAT) 0.4 MG SL tablet Place 0.4 mg under the tongue every 5 (five) minutes as needed.      . Omega-3 Fatty Acids (FISH OIL) 1000 MG CAPS Take 1 capsule by mouth daily.      . traZODone (DESYREL) 50 MG tablet Take 50 mg by mouth at bedtime.        Review of Systems Review of Systems  Constitutional: Negative for fever, chills and unexpected weight  change.  HENT: Negative for hearing loss, congestion, sore throat, trouble swallowing and voice change.   Eyes: Negative for visual disturbance.  Respiratory: Negative for cough and wheezing.   Cardiovascular: Negative for chest pain, palpitations and leg swelling.  Gastrointestinal: Negative for nausea, vomiting, abdominal pain, diarrhea, constipation, blood in stool, abdominal distention, anal bleeding and rectal pain.  Genitourinary: Negative for hematuria and difficulty urinating.  Musculoskeletal: Negative for arthralgias.  Skin: Negative for rash and wound.  Neurological: Negative for seizures, syncope, weakness and headaches.  Hematological: Negative for adenopathy. Does not bruise/bleed easily.  Psychiatric/Behavioral: Negative for confusion.    Blood pressure 118/84, pulse 80, temperature 98 F (36.7 C), resp. rate 16, height 5' 9"  (1.753 m), weight 220 lb (99.791 kg).  Physical Exam Physical Exam  Constitutional: He is oriented to person, place, and  time. He appears well-developed and well-nourished. No distress.  HENT:  Head: Normocephalic and atraumatic.  Abdominal: Soft. Bowel sounds are normal. He exhibits no distension and no mass. There is no tenderness. There is no rebound and no guarding.  Genitourinary:       External anal exam reveals a healthy anoderm. Small sebaceous deposits left lateral. Digital exam reveals mild tenderness anteriorly, no mass, no stenosis. No blood.no fissure.  Anoscopy reveals circumferential internal hemorrhoids. These were injected with sclerosing solution circumferentially. Tolerated well with minimal discomfort.  Neurological: He is alert and oriented to person, place, and time. He exhibits normal muscle tone. Coordination normal.  Skin: Skin is warm and dry. No rash noted. He is not diaphoretic. No erythema. No pallor.  Psychiatric: He has a normal mood and affect. His behavior is normal. Thought content normal.    Data Reviewed Dr. Celesta Aver notes  Assessment    Bleeding internal hemorrhoids, chronic  Diarrhea, chronic    Plan    Internal hemorrhoids injected with sclerosing solution today, circumferential. Tolerated well.  Patient information booklet regarding hemorrhoids and they're management given the patient and discussed.  Return to see me p.r.n. if the bleeding does not completely resolve within one month. Hopefully this will resolve much quicker.       Edsel Petrin. Dalbert Batman, M.D., [facility] General and Minimally invasive Surgery Breast and Colorectal Surgery Office:   [phone] Pager:   [phone]  04/01/2012, 9:21 AM

## 2012-04-05 ENCOUNTER — Ambulatory Visit: Payer: Medicare Other | Admitting: Internal Medicine

## 2012-04-22 ENCOUNTER — Other Ambulatory Visit (HOSPITAL_COMMUNITY): Payer: Self-pay | Admitting: Interventional Radiology

## 2012-04-22 DIAGNOSIS — H539 Unspecified visual disturbance: Secondary | ICD-10-CM

## 2012-04-22 DIAGNOSIS — I729 Aneurysm of unspecified site: Secondary | ICD-10-CM

## 2012-05-03 ENCOUNTER — Ambulatory Visit (HOSPITAL_COMMUNITY)
Admission: RE | Admit: 2012-05-03 | Discharge: 2012-05-03 | Disposition: A | Discharge status: Home / Self Care | Payer: Medicare Other | Source: Ambulatory Visit | Attending: Interventional Radiology | Admitting: Interventional Radiology

## 2012-05-03 ENCOUNTER — Ambulatory Visit (HOSPITAL_COMMUNITY): Payer: Medicare Other

## 2012-05-03 DIAGNOSIS — H539 Unspecified visual disturbance: Secondary | ICD-10-CM

## 2012-05-03 DIAGNOSIS — Q283 Other malformations of cerebral vessels: Secondary | ICD-10-CM | POA: Insufficient documentation

## 2012-05-03 DIAGNOSIS — H538 Other visual disturbances: Secondary | ICD-10-CM | POA: Insufficient documentation

## 2012-05-03 DIAGNOSIS — I729 Aneurysm of unspecified site: Secondary | ICD-10-CM

## 2012-05-07 ENCOUNTER — Telehealth (HOSPITAL_COMMUNITY): Payer: Self-pay

## 2012-05-07 NOTE — Telephone Encounter (Signed)
Called Mr Prehn to advise him that Dr. Estanislado Pandy stated that he wanted his f/u to be in 6 months from today

## 2012-05-26 ENCOUNTER — Emergency Department (HOSPITAL_COMMUNITY): Payer: Medicare Other

## 2012-05-26 ENCOUNTER — Inpatient Hospital Stay (HOSPITAL_COMMUNITY)
Admission: EM | Admit: 2012-05-26 | Discharge: 2012-05-28 | DRG: 066 | Disposition: A | Discharge status: Home / Self Care | Payer: Medicare Other | Source: Ambulatory Visit | Attending: Family Medicine | Admitting: Family Medicine

## 2012-05-26 ENCOUNTER — Encounter (HOSPITAL_COMMUNITY): Payer: Self-pay | Admitting: Emergency Medicine

## 2012-05-26 DIAGNOSIS — I10 Essential (primary) hypertension: Secondary | ICD-10-CM | POA: Diagnosis present

## 2012-05-26 DIAGNOSIS — I639 Cerebral infarction, unspecified: Secondary | ICD-10-CM | POA: Diagnosis present

## 2012-05-26 DIAGNOSIS — Z9861 Coronary angioplasty status: Secondary | ICD-10-CM

## 2012-05-26 DIAGNOSIS — R5381 Other malaise: Secondary | ICD-10-CM | POA: Diagnosis not present

## 2012-05-26 DIAGNOSIS — J4489 Other specified chronic obstructive pulmonary disease: Secondary | ICD-10-CM | POA: Diagnosis present

## 2012-05-26 DIAGNOSIS — R29898 Other symptoms and signs involving the musculoskeletal system: Secondary | ICD-10-CM | POA: Diagnosis present

## 2012-05-26 DIAGNOSIS — R51 Headache: Secondary | ICD-10-CM | POA: Diagnosis not present

## 2012-05-26 DIAGNOSIS — E669 Obesity, unspecified: Secondary | ICD-10-CM | POA: Diagnosis present

## 2012-05-26 DIAGNOSIS — F17200 Nicotine dependence, unspecified, uncomplicated: Secondary | ICD-10-CM

## 2012-05-26 DIAGNOSIS — I6340 Cerebral infarction due to embolism of unspecified cerebral artery: Principal | ICD-10-CM | POA: Diagnosis present

## 2012-05-26 DIAGNOSIS — I6350 Cerebral infarction due to unspecified occlusion or stenosis of unspecified cerebral artery: Secondary | ICD-10-CM | POA: Diagnosis not present

## 2012-05-26 DIAGNOSIS — I2510 Atherosclerotic heart disease of native coronary artery without angina pectoris: Secondary | ICD-10-CM | POA: Diagnosis present

## 2012-05-26 DIAGNOSIS — M109 Gout, unspecified: Secondary | ICD-10-CM | POA: Diagnosis present

## 2012-05-26 DIAGNOSIS — K219 Gastro-esophageal reflux disease without esophagitis: Secondary | ICD-10-CM | POA: Diagnosis present

## 2012-05-26 DIAGNOSIS — J449 Chronic obstructive pulmonary disease, unspecified: Secondary | ICD-10-CM | POA: Diagnosis present

## 2012-05-26 DIAGNOSIS — R209 Unspecified disturbances of skin sensation: Secondary | ICD-10-CM | POA: Diagnosis not present

## 2012-05-26 DIAGNOSIS — Z8673 Personal history of transient ischemic attack (TIA), and cerebral infarction without residual deficits: Secondary | ICD-10-CM

## 2012-05-26 DIAGNOSIS — E785 Hyperlipidemia, unspecified: Secondary | ICD-10-CM | POA: Diagnosis present

## 2012-05-26 DIAGNOSIS — K7460 Unspecified cirrhosis of liver: Secondary | ICD-10-CM | POA: Diagnosis present

## 2012-05-26 LAB — CBC
HCT: 45.5 % (ref 39.0–52.0)
Hemoglobin: 16.2 g/dL (ref 13.0–17.0)
MCHC: 35.6 g/dL (ref 30.0–36.0)
WBC: 15 10*3/uL — ABNORMAL HIGH (ref 4.0–10.5)

## 2012-05-26 LAB — GLUCOSE, CAPILLARY: Glucose-Capillary: 102 mg/dL — ABNORMAL HIGH (ref 70–99)

## 2012-05-26 LAB — POCT I-STAT, CHEM 8
HCT: 49 % (ref 39.0–52.0)
Hemoglobin: 16.7 g/dL (ref 13.0–17.0)
Sodium: 140 mEq/L (ref 135–145)
TCO2: 23 mmol/L (ref 0–100)

## 2012-05-26 LAB — URINALYSIS, ROUTINE W REFLEX MICROSCOPIC
Bilirubin Urine: NEGATIVE
Nitrite: NEGATIVE
Specific Gravity, Urine: 1.018 (ref 1.005–1.030)
Urobilinogen, UA: 0.2 mg/dL (ref 0.0–1.0)

## 2012-05-26 LAB — DIFFERENTIAL
Lymphocytes Relative: 16 % (ref 12–46)
Monocytes Absolute: 0.8 10*3/uL (ref 0.1–1.0)
Monocytes Relative: 5 % (ref 3–12)
Neutro Abs: 11.7 10*3/uL — ABNORMAL HIGH (ref 1.7–7.7)

## 2012-05-26 LAB — RAPID URINE DRUG SCREEN, HOSP PERFORMED
Barbiturates: NOT DETECTED
Tetrahydrocannabinol: NOT DETECTED

## 2012-05-26 LAB — COMPREHENSIVE METABOLIC PANEL
BUN: 17 mg/dL (ref 6–23)
CO2: 22 mEq/L (ref 19–32)
Chloride: 100 mEq/L (ref 96–112)
Creatinine, Ser: 0.92 mg/dL (ref 0.50–1.35)
GFR calc non Af Amer: >90 mL/min (ref >90)
Total Bilirubin: 0.2 mg/dL — ABNORMAL LOW (ref 0.3–1.2)

## 2012-05-26 LAB — APTT: aPTT: 26 seconds (ref 24–37)

## 2012-05-26 LAB — CK TOTAL AND CKMB (NOT AT ARMC)
CK, MB: 2.9 ng/mL (ref 0.3–4.0)
Total CK: 117 U/L (ref 7–232)

## 2012-05-26 MED ORDER — CLOPIDOGREL BISULFATE 75 MG PO TABS
75.0000 mg | ORAL_TABLET | Freq: Every day | ORAL | Status: DC
  Administered 2012-05-27 – 2012-05-28 (×2): 75 mg via ORAL
  Filled 2012-05-26 (×3): qty 1

## 2012-05-26 MED ORDER — ENOXAPARIN SODIUM 40 MG/0.4ML ~~LOC~~ SOLN
40.0000 mg | Freq: Every day | SUBCUTANEOUS | Status: DC
  Administered 2012-05-27 (×2): 40 mg via SUBCUTANEOUS
  Filled 2012-05-26 (×3): qty 0.4

## 2012-05-26 MED ORDER — IBUPROFEN 200 MG PO TABS
400.0000 mg | ORAL_TABLET | Freq: Once | ORAL | Status: AC
  Administered 2012-05-26: 400 mg via ORAL
  Filled 2012-05-26: qty 2

## 2012-05-26 MED ORDER — SODIUM CHLORIDE 0.9 % IV SOLN
INTRAVENOUS | Status: DC
  Administered 2012-05-27: 100 mL/h via INTRAVENOUS

## 2012-05-26 MED ORDER — SENNOSIDES-DOCUSATE SODIUM 8.6-50 MG PO TABS: 1.0000 | ORAL_TABLET | Freq: Every evening | ORAL | Status: DC | PRN

## 2012-05-26 MED ORDER — TRAZODONE HCL 50 MG PO TABS
50.0000 mg | ORAL_TABLET | Freq: Every day | ORAL | Status: DC
  Administered 2012-05-27 (×2): 50 mg via ORAL
  Filled 2012-05-26 (×3): qty 1

## 2012-05-26 MED ORDER — OMEGA-3-ACID ETHYL ESTERS 1 G PO CAPS
1.0000 g | ORAL_CAPSULE | Freq: Every day | ORAL | Status: DC
  Administered 2012-05-27 – 2012-05-28 (×2): 1 g via ORAL
  Filled 2012-05-26 (×2): qty 1

## 2012-05-26 MED ORDER — LOSARTAN POTASSIUM 50 MG PO TABS
100.0000 mg | ORAL_TABLET | Freq: Every day | ORAL | Status: DC
  Administered 2012-05-27 – 2012-05-28 (×2): 100 mg via ORAL
  Filled 2012-05-26 (×2): qty 2

## 2012-05-26 MED ORDER — FISH OIL 1000 MG PO CAPS: 1.0000 | ORAL_CAPSULE | Freq: Every day | ORAL | Status: DC

## 2012-05-26 MED ORDER — ALLOPURINOL 100 MG PO TABS
100.0000 mg | ORAL_TABLET | Freq: Every day | ORAL | Status: DC
  Administered 2012-05-27 – 2012-05-28 (×2): 100 mg via ORAL
  Filled 2012-05-26 (×2): qty 1

## 2012-05-26 MED ORDER — ASPIRIN 81 MG PO CHEW: 324.0000 mg | CHEWABLE_TABLET | Freq: Once | ORAL | Status: DC

## 2012-05-26 MED ORDER — HYDROCORTISONE 2.5 % RE CREA: 1.0000 "application " | TOPICAL_CREAM | Freq: Two times a day (BID) | RECTAL | Status: DC | PRN

## 2012-05-26 MED ORDER — PANTOPRAZOLE SODIUM 40 MG PO TBEC
40.0000 mg | DELAYED_RELEASE_TABLET | Freq: Every day | ORAL | Status: DC
  Administered 2012-05-27 – 2012-05-28 (×2): 40 mg via ORAL
  Filled 2012-05-26 (×2): qty 1

## 2012-05-26 MED ORDER — SODIUM CHLORIDE 0.9 % IV SOLN: INTRAVENOUS | Status: DC

## 2012-05-26 MED ORDER — METOPROLOL TARTRATE 50 MG PO TABS
50.0000 mg | ORAL_TABLET | Freq: Two times a day (BID) | ORAL | Status: DC
  Administered 2012-05-27 – 2012-05-28 (×4): 50 mg via ORAL
  Filled 2012-05-26 (×5): qty 1

## 2012-05-26 MED ORDER — ADULT MULTIVITAMIN W/MINERALS CH
1.0000 | ORAL_TABLET | Freq: Every day | ORAL | Status: DC
  Administered 2012-05-27 – 2012-05-28 (×2): 1 via ORAL
  Filled 2012-05-26 (×2): qty 1

## 2012-05-26 MED ORDER — NITROGLYCERIN 0.4 MG SL SUBL: 0.4000 mg | SUBLINGUAL_TABLET | SUBLINGUAL | Status: DC | PRN

## 2012-05-26 NOTE — ED Notes (Signed)
Pt to CT

## 2012-05-26 NOTE — H&P (Signed)
[name] [name] is an 51 y.o. male.   PCP - Dr.Harris of [place]. Chief Complaint: Right-sided numbness. HPI: 51 year-old male with history of left PCA stenting, hypertension, cirrhosis presented to the ER because of numbness of the right upper and lower extremities. Around 1 PM today patient was moving his yard when he felt weak and nauseous with headache. He decided take some rest and went inside his house and put the fan on. After that he also realized that he is having numbness of the right upper and lower extremity. Initially he had some weakness and he felt as if the upper and lower extremity on the right side did not exist. His family also noticed slurred speech. As his symptoms persisted he decided to come to the ER. In the ER MRI of the brain confirms left parietal stroke. Since he was outside the window he was not administered TPA. Patient is already evaluated by neurologist. Patient will be admitted for further workup.  Past Medical History  Diagnosis Date  . GERD (gastroesophageal reflux disease)   . Gout   . Adenomatous colon polyp 2009  . Lymphocytic colitis 2009  . COPD (chronic obstructive pulmonary disease)   . Hypertension   . NASH (nonalcoholic steatohepatitis)   . Hyperlipidemia   . TIA (transient ischemic attack)   . Asthma   . Alcohol abuse     abstinent since 1992  . Hemorrhoids   . Chronic back pain   . Personal history of colonic polyps 07/20/2002    Diminutive adenomas (3) 06/2002 diminutive adenoma (1) 2011     Past Surgical History  Procedure Date  . Lumbar spine surgery     x 2  . Nasal sinus surgery   . 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 Colonoscopy multiple  . Band hemorrhoidectomy 2003    at sigmoidoscopy    Family History  Problem Relation Age of Onset  . Liver disease Mother   . Liver cancer Mother   . Colon cancer Neg Hx   . Heart disease      multiple family members on maternal and paternal side of family  . Breast cancer Mother   .  Diabetes Sister   . Diabetes Paternal Grandmother    Social History:  reports that he has been smoking Cigarettes.  He has a 41 pack-year smoking history. He has never used smokeless tobacco. He reports that he does not drink alcohol or use illicit drugs.  Allergies:  Allergies  Allergen Reactions  . Penicillins Hives and Rash     (Not in a hospital admission)  Results for orders placed during the hospital encounter of 05/26/12 (from the past 48 hour(s))  PROTIME-INR     Status: Normal   Collection Time   05/26/12  5:16 PM      Component Value Range Comment   Prothrombin Time 13.0  11.6 - 15.2 (seconds)    INR 0.96  0.00 - 1.49    APTT     Status: Normal   Collection Time   05/26/12  5:16 PM      Component Value Range Comment   aPTT 26  24 - 37 (seconds)   CBC     Status: Abnormal   Collection Time   05/26/12  5:16 PM      Component Value Range Comment   WBC 15.0 (*) 4.0 - 10.5 (K/uL)    RBC 5.32  4.22 - 5.81 (MIL/uL)    Hemoglobin 16.2  13.0 - 17.0 (g/dL)    HCT 45.5  39.0 - 52.0 (%)    MCV 85.5  78.0 - 100.0 (fL)    MCH 30.5  26.0 - 34.0 (pg)    MCHC 35.6  30.0 - 36.0 (g/dL)    RDW 12.1  11.5 - 15.5 (%)    Platelets 196  150 - 400 (K/uL)   DIFFERENTIAL     Status: Abnormal   Collection Time   05/26/12  5:16 PM      Component Value Range Comment   Neutrophils Relative 78 (*) 43 - 77 (%)    Neutro Abs 11.7 (*) 1.7 - 7.7 (K/uL)    Lymphocytes Relative 16  12 - 46 (%)    Lymphs Abs 2.4  0.7 - 4.0 (K/uL)    Monocytes Relative 5  3 - 12 (%)    Monocytes Absolute 0.8  0.1 - 1.0 (K/uL)    Eosinophils Relative 0  0 - 5 (%)    Eosinophils Absolute 0.0  0.0 - 0.7 (K/uL)    Basophils Relative 0  0 - 1 (%)    Basophils Absolute 0.0  0.0 - 0.1 (K/uL)   COMPREHENSIVE METABOLIC PANEL     Status: Abnormal   Collection Time   05/26/12  5:16 PM      Component Value Range Comment   Sodium 136  135 - 145 (mEq/L)    Potassium 4.2  3.5 - 5.1 (mEq/L)    Chloride 100  96 - 112 (mEq/L)     CO2 22  19 - 32 (mEq/L)    Glucose, Bld 99  70 - 99 (mg/dL)    BUN 17  6 - 23 (mg/dL)    Creatinine, Ser 0.92  0.50 - 1.35 (mg/dL)    Calcium 10.4  8.4 - 10.5 (mg/dL)    Total Protein 7.3  6.0 - 8.3 (g/dL)    Albumin 4.4  3.5 - 5.2 (g/dL)    AST 25  0 - 37 (U/L)    ALT 42  0 - 53 (U/L)    Alkaline Phosphatase 129 (*) 39 - 117 (U/L)    Total Bilirubin 0.2 (*) 0.3 - 1.2 (mg/dL)    GFR calc non Af Amer >90  >90 (mL/min)    GFR calc Af Amer >90  >90 (mL/min)   CK TOTAL AND CKMB     Status: Normal   Collection Time   05/26/12  5:17 PM      Component Value Range Comment   Total CK 117  7 - 232 (U/L)    CK, MB 2.9  0.3 - 4.0 (ng/mL)    Relative Index 2.5  0.0 - 2.5    TROPONIN I     Status: Normal   Collection Time   05/26/12  5:17 PM      Component Value Range Comment   Troponin I <0.30  <0.30 (ng/mL)   GLUCOSE, CAPILLARY     Status: Abnormal   Collection Time   05/26/12  5:37 PM      Component Value Range Comment   Glucose-Capillary 102 (*) 70 - 99 (mg/dL)   URINE RAPID DRUG SCREEN (HOSP PERFORMED)     Status: Normal   Collection Time   05/26/12  5:41 PM      Component Value Range Comment   Opiates NONE DETECTED  NONE DETECTED     Cocaine NONE DETECTED  NONE DETECTED     Benzodiazepines NONE DETECTED  NONE DETECTED  Amphetamines NONE DETECTED  NONE DETECTED     Tetrahydrocannabinol NONE DETECTED  NONE DETECTED     Barbiturates NONE DETECTED  NONE DETECTED    URINALYSIS, ROUTINE W REFLEX MICROSCOPIC     Status: Normal   Collection Time   05/26/12  5:41 PM      Component Value Range Comment   Color, Urine YELLOW  YELLOW     APPearance CLEAR  CLEAR     Specific Gravity, Urine 1.018  1.005 - 1.030     pH 5.5  5.0 - 8.0     Glucose, UA NEGATIVE  NEGATIVE (mg/dL)    Hgb urine dipstick NEGATIVE  NEGATIVE     Bilirubin Urine NEGATIVE  NEGATIVE     Ketones, ur NEGATIVE  NEGATIVE (mg/dL)    Protein, ur NEGATIVE  NEGATIVE (mg/dL)    Urobilinogen, UA 0.2  0.0 - 1.0 (mg/dL)     Nitrite NEGATIVE  NEGATIVE     Leukocytes, UA NEGATIVE  NEGATIVE  MICROSCOPIC NOT DONE ON URINES WITH NEGATIVE PROTEIN, BLOOD, LEUKOCYTES, NITRITE, OR GLUCOSE <1000 mg/dL.  POCT I-STAT, CHEM 8     Status: Normal   Collection Time   05/26/12  5:43 PM      Component Value Range Comment   Sodium 140  135 - 145 (mEq/L)    Potassium 4.3  3.5 - 5.1 (mEq/L)    Chloride 106  96 - 112 (mEq/L)    BUN 18  6 - 23 (mg/dL)    Creatinine, Ser 0.90  0.50 - 1.35 (mg/dL)    Glucose, Bld 98  70 - 99 (mg/dL)    Calcium, Ion 1.30  1.12 - 1.32 (mmol/L)    TCO2 23  0 - 100 (mmol/L)    Hemoglobin 16.7  13.0 - 17.0 (g/dL)    HCT 49.0  39.0 - 52.0 (%)    Ct Head Wo Contrast  05/26/2012  *RADIOLOGY REPORT*  Clinical Data: Right-sided weakness with headache.  CT HEAD WITHOUT CONTRAST  Technique:  Contiguous axial images were obtained from the base of the skull through the vertex without contrast.  Comparison: 05/03/2012 MR.  Cerebral angiography 06/10/2011.  CT head 11/17/2008.  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  No evidence for acute stroke, subarachnoid hemorrhage, other acute intracranial bleeding, hydrocephalus, mass lesion, or extra-axial fluid.  No significant atrophy or white matter disease.  Calvarium intact.  Negative sinuses, mastoids, and orbits.  Similar appearance to prior MR.  IMPRESSION: No acute intracranial findings.  Previous left PCom aneurysm coiling without recurrent subarachnoid hemorrhage visible.  Original Report Authenticated By: Staci Righter, M.D.   Mr [facility] Wo Contrast  05/26/2012  *RADIOLOGY REPORT*  Clinical Data:  Headache.  Right-sided weakness and numbness. 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  MRI HEAD WITHOUT CONTRAST MRA HEAD WITHOUT CONTRAST  Technique:  Multiplanar, multiecho pulse sequences of the brain and surrounding structures were obtained without intravenous contrast. Angiographic images of the  head were obtained using MRA technique without contrast.  Comparison:  MRI head and MRA head of 05/03/2012.  MRI HEAD  Findings:  The diffusion weighted images demonstrate an acute non hemorrhagic infarct of the posterior left parietal and temporal lobe.  There is associated there is associated T2 and FLAIR hyperintensity with this infarct.  The there is some restricted diffusion within the most superior left parietal lobe as well.  Flow is present in the major intracranial arteries.  There is no hemorrhage  or mass lesion.  Periventricular and subcortical white matter disease is otherwise stable.  The ventricles are of normal size.  The globes and orbits are intact.  Circumferential mucosal thickening in the left maxillary sinus and scattered ethmoid air cells has progressed.  The mastoid air cells are clear.  The  IMPRESSION:  1.  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 2.  Scattered white matter disease is otherwise stable. 3.  Slight progression of left maxillary and ethmoid sinus disease.  MRA HEAD  Findings: Signal loss within the anterior genu of the left cavernous siphon is secondary to the stent. The internal carotid arteries are otherwise within normal limits.  The right A1 segment is aplastic.  Segmental irregularity of MCA branch vessels is similar to the prior exams.  No focal occlusion is evident.  The right A1 segment is aplastic.  Both A2 segments fill from the left.  The vertebral arteries are codominant.  The left PICA origin is visualized and within normal limits.  The left AICA is dominant. Both posterior cerebral arteries originate from the basilar tip. Attenuation of distal PCA branch vessels is similar to the prior exam.  IMPRESSION:  1.  Signal loss within the left cavernous siphon secondary to the stent. 2.  Similar appearance of small vessel disease. 3.  No new small vessel occlusion.  The for no significant interval change.  Original Report Authenticated By: Resa Miner.  MATTERN, M.D.   Mr Brain Wo Contrast  05/26/2012  *RADIOLOGY REPORT*  Clinical Data:  Headache.  Right-sided weakness and numbness. 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  MRI HEAD WITHOUT CONTRAST MRA HEAD WITHOUT CONTRAST  Technique:  Multiplanar, multiecho pulse sequences of the brain and surrounding structures were obtained without intravenous contrast. Angiographic images of the head were obtained using MRA technique without contrast.  Comparison:  MRI head and MRA head of 05/03/2012.  MRI HEAD  Findings:  The diffusion weighted images demonstrate an acute non hemorrhagic infarct of the posterior left parietal and temporal lobe.  There is associated there is associated T2 and FLAIR hyperintensity with this infarct.  The there is some restricted diffusion within the most superior left parietal lobe as well.  Flow is present in the major intracranial arteries.  There is no hemorrhage or mass lesion.  Periventricular and subcortical white matter disease is otherwise stable.  The ventricles are of normal size.  The globes and orbits are intact.  Circumferential mucosal thickening in the left maxillary sinus and scattered ethmoid air cells has progressed.  The mastoid air cells are clear.  The  IMPRESSION:  1.  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 2.  Scattered white matter disease is otherwise stable. 3.  Slight progression of left maxillary and ethmoid sinus disease.  MRA HEAD  Findings: Signal loss within the anterior genu of the left cavernous siphon is secondary to the stent. The internal carotid arteries are otherwise within normal limits.  The right A1 segment is aplastic.  Segmental irregularity of MCA branch vessels is similar to the prior exams.  No focal occlusion is evident.  The right A1 segment is aplastic.  Both A2 segments fill from the left.  The vertebral arteries are codominant.  The left PICA origin is visualized and within normal limits.  The  left AICA is dominant. Both posterior cerebral arteries originate from the basilar tip. Attenuation of distal PCA branch vessels is similar to the prior exam.  IMPRESSION:  1.  Signal loss within the left cavernous siphon secondary to the stent. 2.  Similar appearance of small vessel disease. 3.  No new small vessel occlusion.  The for no significant interval change.  Original Report Authenticated By: Resa Miner. MATTERN, M.D.    Review of Systems  Constitutional: Negative.   Eyes: Negative.   Respiratory: Negative.   Cardiovascular: Negative.   Gastrointestinal: Positive for nausea.  Genitourinary: Negative.   Musculoskeletal: Negative.   Skin: Negative.   Neurological: Positive for headaches.       Right sided numbness and weakness and slurred speech.  Psychiatric/Behavioral: Negative.     Blood pressure 136/80, pulse 66, temperature 97.8 F (36.6 C), temperature source Oral, resp. rate 11, SpO2 99.00%. Physical Exam  Constitutional: He is oriented to person, place, and time. He appears well-developed and well-nourished. No distress.  HENT:  Head: Normocephalic and atraumatic.  Right Ear: External ear normal.  Left Ear: External ear normal.  Nose: Nose normal.  Mouth/Throat: Oropharynx is clear and moist. No oropharyngeal exudate.  Eyes: Conjunctivae are normal. Pupils are equal, round, and reactive to light. Right eye exhibits no discharge. Left eye exhibits no discharge. No scleral icterus.  Neck: Normal range of motion. Neck supple.  Cardiovascular: Normal rate and regular rhythm.   Respiratory: Effort normal and breath sounds normal. No respiratory distress. He has no wheezes. He has no rales.  GI: Soft. Bowel sounds are normal. He exhibits no distension. There is no tenderness. There is no rebound.  Musculoskeletal: Normal range of motion. He exhibits no edema and no tenderness.  Neurological: He is alert and oriented to person, place, and time.       No facial asymmetry.  Moves all extremities 5/5. No pronator drift. Tongue is midline.  Skin: Skin is warm and dry. No rash noted. He is not diaphoretic. No erythema.  Psychiatric: His behavior is normal.     Assessment/Plan #1. Acute CVA - swallow evaluation, neuro checks, Plavix, carotid Doppler and 2-D echo. Gently hydrate. #2. History of left PCA aneurysm status post stenting. #3. Hypertension - continue home medications. #4. Ongoing tobacco abuse - advised to quit smoking. #5. 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 #6. History of CAD and medically managed.  CODE STATUS - full code.  Rise Patience 05/26/2012, 10:56 PM

## 2012-05-26 NOTE — ED Notes (Signed)
Report received from [facility].  Pt reports sudden onset of headache, R sided weakness with numbness and tingling, and confusion that started at 1330.  Pt had been outside push mowing, came inside to take a shower, and then went outside to smoke.  When pt started coming back in house at 1330 symptoms started and pt was unable to figure out how to open front door.  On EMS arrival, pt had GCS of 14, R arm drift, R sided weakness, dragging R foot when trying to ambulate, and c/o headache. Denies slurred speech. While in route to ED symptoms started to resolve. GCS 15.  Grips strong and equal, no arm drift, no lower extremity weakness.  Pt does still have a headache but denies all other symptoms. Dr. Monia Pouch at bedside.

## 2012-05-26 NOTE — Consult Note (Signed)
Neurology Consultation  Referring Physician: Dr. Monia Pouch Primary Care Physician: Shirline Frees, MD, MD  Chief Complaint: Right sided numbness  HPI: [name] is a 51 y.o.  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 The patient was at home today mowing his yard.  He decided to use his push mower instead of the riding mower to get some exercise.  He was able to do his backyard and start his front yard before he began overheating.  He sat around and tried to cool off but developed a headache and some nausea.  He went inside and showered, but still felt nauseous with a headache.  He was sat to watch some TV but noticed his right side went numb.  His speech also didn't make sense so his family brought him to the ER for evaluation.  He is back to his normal self at this point.  Current facility-administered medications:0.9 %  sodium chloride infusion, , Intravenous, Continuous, Mylinda Latina III, MD;  aspirin chewable tablet 324 mg, 324 mg, Oral, Once, Mylinda Latina III, MD;  ibuprofen (ADVIL,MOTRIN) tablet 400 mg, 400 mg, Oral, Once, Mylinda Latina III, MD, 400 mg at 05/26/12 1937 Current outpatient prescriptions:allopurinol (ZYLOPRIM) 100 MG tablet, Take 100 mg by mouth daily., Disp: , Rfl: ;  aspirin 325 MG tablet, Take 325 mg by mouth daily., Disp: , Rfl: ;  esomeprazole (NEXIUM) 40 MG capsule, Take 40 mg by mouth 2 (two) times daily., Disp: , Rfl: ;  hydrocortisone (ANUSOL-HC) 2.5 % rectal cream, Place 1 application rectally 2 (two) times daily as needed. For irritation, Disp: , Rfl:  losartan (COZAAR) 100 MG tablet, Take 100 mg by mouth daily., Disp: , Rfl: ;  metoprolol (LOPRESSOR) 50 MG tablet, Take 50 mg by mouth 2 (two) times daily., Disp: , Rfl: ;  Milk Thistle 500 MG CAPS, Take 1,000 mg by mouth daily. , Disp: , Rfl: ;  Multiple Vitamin (MULITIVITAMIN WITH MINERALS) TABS, Take 1  tablet by mouth daily., Disp: , Rfl:  naproxen (NAPROSYN) 500 MG tablet, Take 500 mg by mouth 2 (two) times daily as needed. For pain/fever, Disp: , Rfl: ;  nitroGLYCERIN (NITROSTAT) 0.4 MG SL tablet, Place 0.4 mg under the tongue every 5 (five) minutes as needed. For chest pain, Disp: , Rfl: ;  Omega-3 Fatty Acids (FISH OIL) 1000 MG CAPS, Take 1 capsule by mouth daily., Disp: , Rfl: ;  traZODone (DESYREL) 50 MG tablet, Take 50 mg by mouth at bedtime., Disp: , Rfl:    Past Medical History  Diagnosis Date  . GERD (gastroesophageal reflux disease)   . Gout   . Adenomatous colon polyp 2009  . Lymphocytic colitis 2009  . COPD (chronic obstructive pulmonary disease)   . Hypertension   . NASH (nonalcoholic steatohepatitis)   . Hyperlipidemia   . TIA (transient ischemic attack)   . Asthma   . Alcohol abuse     abstinent since 1992  . Hemorrhoids   . Chronic back pain   . Personal history of colonic polyps 07/20/2002    Diminutive adenomas (3) 06/2002 diminutive adenoma (1) 2011      Past Surgical History  Procedure Date  . Lumbar spine surgery     x 2  . Nasal sinus surgery   . 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 Colonoscopy multiple  . Band hemorrhoidectomy 2003  at sigmoidoscopy    Allergies  Allergen Reactions  . Penicillins Hives and Rash    Family History  Problem Relation Age of Onset  . Liver disease Mother   . Liver cancer Mother   . Colon cancer Neg Hx   . Heart disease      multiple family members on maternal and paternal side of family  . Breast cancer Mother   . Diabetes Sister   . Diabetes Paternal Grandmother     History  Substance Use Topics  . Smoking status: Current Everyday Smoker -- 1.0 packs/day for 41 years    Types: Cigarettes  . Smokeless tobacco: Never Used  . Alcohol Use: No      Review of Systems A complete review of systems was performed and was negative.  Physical Exam: BP 133/83  Pulse 80  Temp(Src) 97.8 F (36.6 C) (Oral)   Resp 19  SpO2 97%  GENERAL:     Well nourished, well hydrated, no acute distress.   CARDIOVASCULAR:   - Regular rate and rhythm, no thrills or palpable murmurs, S1, S2, no murmur, no rubs or gallops.  - Carotid arteries: No carotid bruits.   MENTAL STATUS EXAM:    - Orientation: Alert and oriented to person, place and time.  - Memory: Cooperative, follows commands well. Recent and remote memory normal.  - Attention, concentration: Attention span and concentration are normal.  - Language: Speech is mildly dysarthric although daughter states is baseline and language is normal.  - Fund of knowledge: Aware of current events, vocabulary appropriate for patient age.   CRANIAL NERVES:    - CN 2 (Optic): Visual fields intact to confrontation, funduscopic examination without optic disk pallor or edema, retinal vessels are normal.  - CN 3,4,6 (EOM): Pupils equal and reactive to light and near full eye movement without nystagmus.  - CN 5 (Trigeminal): Facial sensation is normal, no weakness of masticatory muscles.  - CN 7 (Facial): No facial weakness or asymmetry.  - CN 8 (Auditory): Auditory acuity grossly normal.  - CN 9,10 (Glossophar): The uvula is midline, the palate elevates symmetrically.  - CN 11 (spinal access): Normal sternocleidomastoid and trapezius strength.  -CN 12 (Hypoglossal): The tongue is midline. No atrophy or fasciculations.   MOTOR:  5/5 strength throughout bilateral upper and lower extremities. Muscle Tone: Tone and muscle bulk are normal in the upper and lower extremities.   REFLEXES:   - Biceps:                 (R): 2+  (L):2+  - Brachioradialis:    (R): 2+  (L): 2+  - Patellar:                (R): 2+   (L):2+  - Achilles:                (R): 1+   (L):1+  - Babinski:   (R): absent  (L): absent  COORDINATION:  finger-to-nose intact, heel-to-shin intact, and rapid alternating movements intact, no tremor.   SENSATION:   Intact to light touch, vibration, pinprick.  Negative Romberg test.   GAIT: Routine and tandem gait are normal.  Interval: Baseline (05/29 1650) Level of Consciousness (1a. ): Alert, keenly responsive (05/29 2039) LOC Questions (1b. ): Answers both questions correctly (05/29 2039) LOC Commands (1c. ): Performs both tasks correctly (05/29 2039) Best Gaze (2. ): Normal (05/29 1650) Visual (3. ): No visual loss (05/29 1650) Facial Palsy (4. ): Normal symmetrical  movements (05/29 2039) Motor Arm, Left (5a. ): No drift (05/29 2039) Motor Arm, Right (5b. ): No drift (05/29 2039) Motor Leg, Left (6a. ): No drift (05/29 2039) Motor Leg, Right (6b. ): No drift (05/29 2039) Limb Ataxia (7. ): Absent (05/29 1650) Sensory (8. ): Normal, no sensory loss (05/29 1650) Best Language (9. ): No aphasia (05/29 1650) Dysarthria (10. ): Normal (05/29 1650) Inattention/Extinction: No Abnormality (05/29 1650) Total: 0  (05/29 1650)   Diagnostic Studies: MRI Brain- acute L posterior parietal infarct, in watershed area MRA-  1. Signal loss within the left cavernous siphon secondary to the  stent.  2. Similar appearance of small vessel disease.  3. No new small vessel occlusion. The for no significant interval  change.  Impression: 51 y/o male with acute L parietal infarct, not a tPA candidate as he presented outside the window.  Etiology is either thrombotic or potentially embolic as he has risk factors for both.  Plan: - Carotid Ultrasound - TTE - Please check HbA1c, Fasting Lipid Profile, and TSH - Monitor on Telemetry for arrhythmias. - Antiplatelet/Anticoaguation:  Can DC Aspirin and Start Plavix 75 mg daily.   It has been a pleasure to participate in the care of this patient.  Best Regards, Alexander Mt, MD

## 2012-05-26 NOTE — ED Provider Notes (Signed)
History     CSN: [id number]  Arrival date & time 05/26/12  1642   First MD Initiated Contact with Patient 05/26/12 1644      Chief Complaint  Patient presents with  . Headache  . Extremity Weakness    (Consider location/radiation/quality/duration/timing/severity/associated sxs/prior treatment) HPI Comments: The patient 51 year old man with a prior history of vascular coiling for a cerebral aneurysm one or 2 years ago. Today he had mowed his yard. It was quite hot outside. He went in to take a shower, and suddenly couldn't feel the right side of his body, had a bad headache and nausea, couldn't open the door. This started around 1:30 PM today. He went and laid down in front of a fan, but felt worse. Therefore EMS was called, he was brought to [facility] ED for evaluation.  Patient is a 51 y.o. male presenting with extremity weakness and Acute Neurological Problem. The history is provided by the patient and medical records. No language interpreter was used.  Extremity Weakness Associated symptoms include headaches.  Cerebrovascular Accident This is a new problem. The current episode started 3 to 5 hours ago. The problem has been gradually improving. Associated symptoms include headaches. Associated symptoms comments: Right-sided numbness and weakness.. The symptoms are aggravated by nothing. The symptoms are relieved by nothing. Treatments tried: EMS brought the patient to [facility] for evaluation.    Past Medical History  Diagnosis Date  . GERD (gastroesophageal reflux disease)   . Gout   . Adenomatous colon polyp 2009  . Lymphocytic colitis 2009  . COPD (chronic obstructive pulmonary disease)   . Hypertension   . NASH (nonalcoholic steatohepatitis)   . Hyperlipidemia   . TIA (transient ischemic attack)   . Asthma   . Alcohol abuse     abstinent since 1992  . Hemorrhoids   . Chronic back pain   . Personal history of colonic polyps 07/20/2002    Diminutive adenomas  (3) 06/2002 diminutive adenoma (1) 2011     Past Surgical History  Procedure Date  . Lumbar spine surgery     x 2  . Nasal sinus surgery   . 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 Colonoscopy multiple  . Band hemorrhoidectomy 2003    at sigmoidoscopy    Family History  Problem Relation Age of Onset  . Liver disease Mother   . Liver cancer Mother   . Colon cancer Neg Hx   . Heart disease      multiple family members on maternal and paternal side of family  . Breast cancer Mother   . Diabetes Sister   . Diabetes Paternal Grandmother     History  Substance Use Topics  . Smoking status: Current Everyday Smoker -- 1.0 packs/day for 41 years    Types: Cigarettes  . Smokeless tobacco: Never Used  . Alcohol Use: No      Review of Systems  Eyes: Negative.   Respiratory: Negative.   Cardiovascular: Negative.   Gastrointestinal: Positive for nausea. Negative for vomiting and diarrhea.  Genitourinary: Negative.   Musculoskeletal: Positive for extremity weakness.  Skin: Negative.   Neurological: Positive for weakness, numbness and headaches.  Psychiatric/Behavioral: Negative.     Allergies  Penicillins  Home Medications   Current Outpatient Rx  Name Route Sig Dispense Refill  . ALLOPURINOL 100 MG PO TABS Oral Take 100 mg by mouth daily.    . ASPIRIN 325 MG PO TABS Oral Take 325 mg by  mouth daily.    Marland Kitchen ESOMEPRAZOLE MAGNESIUM 40 MG PO CPDR Oral Take 40 mg by mouth 2 (two) times daily.    Marland Kitchen LOSARTAN POTASSIUM 100 MG PO TABS Oral Take 100 mg by mouth daily.    Marland Kitchen METOPROLOL TARTRATE 50 MG PO TABS Oral Take 50 mg by mouth 2 (two) times daily.    Marland Kitchen MILK THISTLE 500 MG PO CAPS Oral Take 1 capsule by mouth daily.    Marland Kitchen NAPROXEN 500 MG PO TABS Oral Take 500 mg by mouth 2 (two) times daily as needed.    Marland Kitchen NITROGLYCERIN 0.4 MG SL SUBL Sublingual Place 0.4 mg under the tongue every 5 (five) minutes as needed.    Marland Kitchen FISH OIL 1000 MG PO CAPS Oral Take 1 capsule by mouth daily.    .  TRAZODONE HCL 50 MG PO TABS Oral Take 50 mg by mouth at bedtime.      BP 144/85  Pulse 99  Temp 98.4 F (36.9 C)  Resp 11  SpO2 97%  Physical Exam  Nursing note and vitals reviewed. Constitutional: He is oriented to person, place, and time. He appears well-developed and well-nourished. No distress.  HENT:  Head: Normocephalic and atraumatic.  Right Ear: External ear normal.  Left Ear: External ear normal.  Mouth/Throat: Oropharynx is clear and moist.  Eyes: Conjunctivae and EOM are normal. Pupils are equal, round, and reactive to light. No scleral icterus.  Neck: Normal range of motion. Neck supple.       No carotid bruits.  Cardiovascular: Normal rate, regular rhythm and normal heart sounds.   Pulmonary/Chest: Effort normal and breath sounds normal.  Abdominal: Soft. Bowel sounds are normal.  Musculoskeletal: Normal range of motion. He exhibits no edema and no tenderness.  Neurological: He is alert and oriented to person, place, and time.       No facial asymmetry. No dysphasia. No sensory or motor deficit.  Skin: Skin is warm and dry.  Psychiatric: He has a normal mood and affect. His behavior is normal.    ED Course  Procedures (including critical care time)   Labs Reviewed  PROTIME-INR  APTT  CBC  DIFFERENTIAL  COMPREHENSIVE METABOLIC PANEL  CK TOTAL AND CKMB  TROPONIN I  URINE RAPID DRUG SCREEN (HOSP PERFORMED)  URINALYSIS, ROUTINE W REFLEX MICROSCOPIC   5:15 PM Patient was seen and had physical examination. Stroke workup labs were entered. Old charts were reviewed.  5:17 PM NIH stroke score was 0.  Code STroke order cancelled.  5:41 PM  Date: 05/26/2012  Rate:96  Rhythm: normal sinus rhythm  QRS Axis: normal  Intervals: normal PQRS:  Left atrial abnormality  ST/T Wave abnormalities: normal  Conduction Disutrbances:  Incomplete right bundle branch block.   Narrative Interpretation: Abnormal EKG  Old EKG Reviewed: unchanged   9:20 PM MRI read by  Dr. Jobe Igo.  He called to say that pt had had a small stroke.  Call to Neurology --> will see in consultation and requests [facility] to admit pt.  1. CVA (cerebral infarction)        Mylinda Latina III, MD 05/26/12 2146

## 2012-05-26 NOTE — ED Notes (Signed)
Attempted to call report. RN to call back. 

## 2012-05-26 NOTE — ED Notes (Signed)
Pt in MRI.

## 2012-05-27 ENCOUNTER — Inpatient Hospital Stay (HOSPITAL_COMMUNITY): Payer: Medicare Other

## 2012-05-27 DIAGNOSIS — I6350 Cerebral infarction due to unspecified occlusion or stenosis of unspecified cerebral artery: Secondary | ICD-10-CM

## 2012-05-27 DIAGNOSIS — I25810 Atherosclerosis of coronary artery bypass graft(s) without angina pectoris: Secondary | ICD-10-CM

## 2012-05-27 DIAGNOSIS — F17200 Nicotine dependence, unspecified, uncomplicated: Secondary | ICD-10-CM

## 2012-05-27 DIAGNOSIS — G459 Transient cerebral ischemic attack, unspecified: Secondary | ICD-10-CM

## 2012-05-27 DIAGNOSIS — I10 Essential (primary) hypertension: Secondary | ICD-10-CM

## 2012-05-27 DIAGNOSIS — I517 Cardiomegaly: Secondary | ICD-10-CM

## 2012-05-27 LAB — CBC
MCH: 30.1 pg (ref 26.0–34.0)
MCHC: 34.7 g/dL (ref 30.0–36.0)
MCV: 86.7 fL (ref 78.0–100.0)
Platelets: 186 10*3/uL (ref 150–400)
RDW: 12.4 % (ref 11.5–15.5)
WBC: 8.9 10*3/uL (ref 4.0–10.5)

## 2012-05-27 LAB — COMPREHENSIVE METABOLIC PANEL
ALT: 36 U/L (ref 0–53)
Albumin: 3.9 g/dL (ref 3.5–5.2)
Alkaline Phosphatase: 115 U/L (ref 39–117)
BUN: 17 mg/dL (ref 6–23)
Chloride: 103 mEq/L (ref 96–112)
Potassium: 4.1 mEq/L (ref 3.5–5.1)
Total Bilirubin: 0.5 mg/dL (ref 0.3–1.2)

## 2012-05-27 LAB — LIPID PANEL: LDL Cholesterol: 69 mg/dL (ref 0–99)

## 2012-05-27 MED ORDER — ATORVASTATIN CALCIUM 40 MG PO TABS
40.0000 mg | ORAL_TABLET | Freq: Every day | ORAL | Status: DC
  Administered 2012-05-27: 40 mg via ORAL
  Filled 2012-05-27 (×2): qty 1

## 2012-05-27 NOTE — Progress Notes (Signed)
VASCULAR LAB PRELIMINARY  PRELIMINARY  PRELIMINARY  PRELIMINARY  Carotid Dopplers completed.    Preliminary report:  There is no significant ICA stenosis.  Vertebral artery flow is antegrade.  [name] [name], 05/27/2012, 10:15 AM

## 2012-05-27 NOTE — Discharge Instructions (Signed)
STROKE/TIA DISCHARGE INSTRUCTIONS SMOKING Cigarette smoking nearly doubles your risk of having a stroke & is the single most alterable risk factor  If you smoke or have smoked in the last 12 months, you are advised to quit smoking for your health.  Most of the excess cardiovascular risk related to smoking disappears within a year of stopping.  Ask you doctor about anti-smoking medications  [place] Quit Line: 1-800-QUIT NOW  Free Smoking Cessation Classes ([phone]  CHOLESTEROL Know your levels; limit fat & cholesterol in your diet  Lipid Panel     Component Value Date/Time   CHOL 162 05/27/2012 0540   TRIG 335* 05/27/2012 0540   HDL 26* 05/27/2012 0540   CHOLHDL 6.2 05/27/2012 0540   VLDL 67* 05/27/2012 0540   LDLCALC 69 05/27/2012 0540      Many patients benefit from treatment even if their cholesterol is at goal.  Goal: Total Cholesterol (CHOL) less than 160  Goal:  Triglycerides (TRIG) less than 150  Goal:  HDL greater than 40  Goal:  LDL (LDLCALC) less than 100   BLOOD PRESSURE American Stroke Association blood pressure target is less that 120/80 mm/Hg  Your discharge blood pressure is:  BP: 130/80 mmHg  Monitor your blood pressure  Limit your salt and alcohol intake  Many individuals will require more than one medication for high blood pressure  DIABETES (A1c is a blood sugar average for last 3 months) Goal HGBA1c is under 7% (HBGA1c is blood sugar average for last 3 months)  Diabetes: {STROKE DC DIABETES:22357}    No results found for this basename: HGBA1C     Your HGBA1c can be lowered with medications, healthy diet, and exercise.  Check your blood sugar as directed by your physician  Call your physician if you experience unexplained or low blood sugars.  PHYSICAL ACTIVITY/REHABILITATION Goal is 30 minutes at least 4 days per week    {STROKE DC ACTIVITY/REHAB:22359}  Activity decreases your risk of heart attack and stroke and makes your heart stronger.  It helps  control your weight and blood pressure; helps you relax and can improve your mood.  Participate in a regular exercise program.  Talk with your doctor about the best form of exercise for you (dancing, walking, swimming, cycling).  DIET/WEIGHT Goal is to maintain a healthy weight  Your discharge diet is: Cardiac *** liquids Your height is:  Height: 5' 8"  (172.7 cm) Your current weight is: Weight: 100.7 kg (222 lb 0.1 oz) Your Body Mass Index (BMI) is:  BMI (Calculated): 33.8   Following the type of diet specifically designed for you will help prevent another stroke.  Your goal weight range is:  ***  Your goal Body Mass Index (BMI) is 19-24.  Healthy food habits can help reduce 3 risk factors for stroke:  High cholesterol, hypertension, and excess weight.  RESOURCES Stroke/Support Group:  Call [phone]  they meet the 3rd Sunday of the month on the Rehab Unit at [facility], [place] ( no meetings June, July & Aug).  STROKE EDUCATION PROVIDED/REVIEWED AND GIVEN TO PATIENT Stroke warning signs and symptoms How to activate emergency medical system (call 911). Medications prescribed at discharge. Need for follow-up after discharge. Personal risk factors for stroke. Pneumonia vaccine given:   {STROKE DC YES/NO/DATE:22363} Flu vaccine given:   {STROKE DC YES/NO/DATE:22363} My questions have been answered, the writing is legible, and I understand these instructions.  I will adhere to these goals & educational materials that have been provided to me  after my discharge from the hospital.

## 2012-05-27 NOTE — Progress Notes (Signed)
  Echocardiogram 2D Echocardiogram has been performed.  [name] [name] L 05/27/2012, 9:55 AM

## 2012-05-27 NOTE — Progress Notes (Signed)
Stroke Team Progress Note  HISTORY Mr. FREDRICK GEOGHEGAN is a 51 y.o. 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 The patient was at home mowing his yard. He decided to use his push mower instead of the riding mower to get some exercise. He was able to do his backyard and start his front yard before he began overheating. He sat around and tried to cool off but developed a headache and some nausea. He went inside and showered, but still felt nauseous with a headache. He was sat to watch some TV but noticed his right side went numb. His speech also didn't make sense so his family brought him to the ER for evaluation. He is back to his normal self at this point. Patient was not a TPA candidate secondary to symptom resolution. He was admitted for further evaluation and treatment.  SUBJECTIVE His care team is at the bedside.  Overall he feels his condition is completely resolved. His symptoms lasted few hours only. 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  OBJECTIVE Most recent Vital Signs: Filed Vitals:   05/27/12 0300 05/27/12 0500 05/27/12 0610 05/27/12 0817  BP: 127/65 130/86 130/80 135/75  Pulse: 65 67 67 66  Temp: 97.6 F (36.4 C) 98.2 F (36.8 C) 97.6 F (36.4 C) 98 F (36.7 C)  TempSrc: Oral Oral Oral Oral  Resp: 18 18 18 18   Height:      Weight:      SpO2: 96% 96% 95% 97%   CBG (last 3)  Basename 05/26/12 1737  GLUCAP 102*   Intake/Output from previous day: 05/29 0701 - 05/30 0700 In: 688.3 [P.O.:100; I.V.:588.3] Out: -   IV Fluid Intake:     . sodium chloride 100 mL/hr (05/27/12 0007)  . DISCONTD: sodium chloride     MEDICATIONS    . allopurinol  100 mg Oral Daily  . aspirin  324 mg Oral Once  . clopidogrel  75 mg Oral Q breakfast  . enoxaparin  40 mg Subcutaneous QHS  . ibuprofen  400 mg  Oral Once  . losartan  100 mg Oral Daily  . metoprolol  50 mg Oral BID  . mulitivitamin with minerals  1 tablet Oral Daily  . omega-3 acid ethyl esters  1 g Oral Daily  . pantoprazole  40 mg Oral Daily  . traZODone  50 mg Oral QHS  . DISCONTD: Fish Oil  1 capsule Oral Daily   PRN:  hydrocortisone, nitroGLYCERIN, senna-docusate  Diet:  Cardiac thin liquids Activity:   Bathroom privileges with assistance DVT Prophylaxis:  SCDs   CLINICALLY SIGNIFICANT STUDIES Basic Metabolic Panel:  Lab 24/26/83 0540 05/26/12 1743 05/26/12 1716  NA 140 140 --  K 4.1 4.3 --  CL 103 106 --  CO2 25 -- 22  GLUCOSE 95 98 --  BUN 17 18 --  CREATININE 0.79 0.90 --  CALCIUM 9.6 -- 10.4  MG -- -- --  PHOS -- -- --   Liver Function Tests:  Lab 05/27/12 0540 05/26/12 1716  AST 24 25  ALT 36 42  ALKPHOS 115 129*  BILITOT 0.5 0.2*  PROT 6.5 7.3  ALBUMIN 3.9 4.4   CBC:  Lab 05/27/12 0540 05/26/12 1743 05/26/12 1716  WBC 8.9 -- 15.0*  NEUTROABS -- -- 11.7*  HGB 15.1 16.7 --  HCT 43.5 49.0 --  MCV 86.7 -- 85.5  PLT 186 -- 196   Coagulation:  Lab 05/26/12 1716  LABPROT 13.0  INR 0.96   Cardiac Enzymes:  Lab 05/26/12 1717  CKTOTAL 117  CKMB 2.9  CKMBINDEX --  TROPONINI <0.30   Urinalysis:  Lab 05/26/12 1741  COLORURINE YELLOW  LABSPEC 1.018  PHURINE 5.5  GLUCOSEU NEGATIVE  HGBUR NEGATIVE  BILIRUBINUR NEGATIVE  KETONESUR NEGATIVE  PROTEINUR NEGATIVE  UROBILINOGEN 0.2  NITRITE NEGATIVE  LEUKOCYTESUR NEGATIVE   Lipid Panel    Component Value Date/Time   CHOL 162 05/27/2012 0540   TRIG 335* 05/27/2012 0540   HDL 26* 05/27/2012 0540   CHOLHDL 6.2 05/27/2012 0540   VLDL 67* 05/27/2012 0540   LDLCALC 69 05/27/2012 0540   HgbA1C  No results found for this basename: HGBA1C   Urine Drug Screen:     Component Value Date/Time   LABOPIA NONE DETECTED 05/26/2012 1741   COCAINSCRNUR NONE DETECTED 05/26/2012 1741   LABBENZ NONE DETECTED 05/26/2012 1741   AMPHETMU NONE DETECTED 05/26/2012  1741   THCU NONE DETECTED 05/26/2012 1741   LABBARB NONE DETECTED 05/26/2012 1741    CT of the brain  05/26/2012 No acute intracranial findings.  Previous left PCom aneurysm coiling without recurrent subarachnoid hemorrhage visible.   MRI of the brain  05/26/2012   1.  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 2.  Scattered white matter disease is otherwise stable. 3.  Slight progression of left maxillary and ethmoid sinus disease.    MRA of the brain  05/26/2012    1.  Signal loss within the left cavernous siphon secondary to the stent. 2.  Similar appearance of small vessel disease. 3.  No new small vessel occlusion.  The for no significant interval change.   2D Echocardiogram    Carotid Doppler  No internal carotid artery stenosis bilaterally. Vertebrals with antegrade flow bilaterally.   CXR  05/27/2012   No active lung disease.  Stable borderline cardiomegaly  EKG  normal sinus rhythm.   Therapy Recommendations PT -none, OT -none  Physical Exam  Pleasant middle aged patient not in distress.Awake alert. Afebrile. Head is nontraumatic. Neck is supple without bruit. Hearing is normal. Cardiac exam no murmur or gallop. Lungs are clear to auscultation. Distal pulses are well felt.  Neurological Exam: Awake  Alert oriented x 3. Normal speech and language.eye movements full without nystagmus.Fundi not visualized.Visual acuity adequate. Face symmetric. Tongue midline. Normal strength, tone, reflexes and coordination. Normal sensation. Gait deferred.  ASSESSMENT Mr. BRUNO LEACH is a 51 y.o. 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 Workup underway. On aspirin 325 mg orally every day prior to admission. Now on clopidogrel 75 mg orally every day for secondary stroke prevention. Patient with no resultant neuro symptoms.  -cigarette smoker, down from 3 ppd to 1 ppd -former ETOH abuser -obesity, Body mass index is 33.76 kg/(m^2).   Hospital day  # 1  TREATMENT/PLAN -Continue clopidogrel 75 mg orally every day for secondary stroke prevention. -TEE tomorrow. Arranged with [place] cardiology, whom he has seen in the past. -D/W patient and family Burnetta Sabin, AVNP, ANP-BC, GNP-BC [facility] Pager: [phone] 05/27/2012 9:20 AM  Scribe for Dr. Antony Contras, [place] Director. He has personally reviewed chart, pertinent data, examined the patient and developed the plan of care. Pager:  [phone]

## 2012-05-27 NOTE — Progress Notes (Signed)
Occupational Therapy Evaluation Patient Details Name: [name] [name] MRN: [id number] DOB: 1961/05/03 Today's Date: 05/27/2012 Time: 0712-1975 OT Time Calculation (min): 13 min  OT Assessment / Plan / Recommendation Clinical Impression  Pt admitted with right sided weakness. MRI findings show acute non hemorrhagic infarct of the posterior left parietal and temporal lobe. Pt demonstrating ADLs at baseline level.  Supervision with functional transfers and ambulation only due to lines and leads.  Educated pt on stroke signs and symptoms.  No further acute OT needs. Signing off.    OT Assessment  Patient does not need any further OT services    Follow Up Recommendations  No OT follow up    Barriers to Discharge      Equipment Recommendations  None recommended by OT    Recommendations for Other Services    Frequency       Precautions / Restrictions Precautions Precautions: Fall Restrictions Weight Bearing Restrictions: No   Pertinent Vitals/Pain See vitals    ADL  Grooming: Performed;Wash/dry hands;Modified independent Where Assessed - Grooming: Unsupported standing Lower Body Dressing: Performed;Modified independent Where Assessed - Lower Body Dressing: Unsupported sit to stand Toilet Transfer: Performed;Supervision/safety Toilet Transfer Method:  (ambulating) Science writer: Regular height toilet Tub/Shower Transfer: Armed forces operational officer Method: Ambulating Equipment Used: Gait belt Transfers/Ambulation Related to ADLs: Pt ambulated with supervision for safety due to lines and leads.     OT Diagnosis:    OT Problem List:   OT Treatment Interventions:     OT Goals    Visit Information  Last OT Received On: 05/27/12 Assistance Needed: +1 PT/OT Co-Evaluation/Treatment: Yes    Subjective Data      Prior Functioning  Home Living Lives With: Spouse Available Help at Discharge: Family;Available 24 hours/day Type of Home:  Mobile home Home Access: Level entry Home Layout: One level Bathroom Shower/Tub: Tub/shower unit;Curtain Bathroom Toilet: Handicapped height Bathroom Accessibility: Yes How Accessible: Accessible via walker Home Adaptive Equipment: Straight cane;Bedside commode/3-in-1;Walker - rolling Prior Function Level of Independence: Independent Able to Take Stairs?: Yes Driving: Yes Vocation: On disability Communication Communication: No difficulties Dominant Hand: Right    Cognition  Overall Cognitive Status: Appears within functional limits for tasks assessed/performed Arousal/Alertness: Awake/alert Orientation Level: Appears intact for tasks assessed Behavior During Session: [facility] for tasks performed    Extremity/Trunk Assessment Right Upper Extremity Assessment RUE ROM/Strength/Tone: Within functional levels RUE Sensation: WFL - Light Touch;WFL - Proprioception RUE Coordination: WFL - gross/fine motor Left Upper Extremity Assessment LUE ROM/Strength/Tone: Within functional levels LUE Sensation: WFL - Light Touch;WFL - Proprioception LUE Coordination: WFL - gross/fine motor Right Lower Extremity Assessment RLE ROM/Strength/Tone: Within functional levels RLE Sensation: WFL - Light Touch RLE Coordination: WFL - gross/fine motor Left Lower Extremity Assessment LLE ROM/Strength/Tone: Within functional levels LLE Sensation: WFL - Light Touch LLE Coordination: WFL - gross/fine motor   Mobility Bed Mobility Bed Mobility: Not assessed Transfers Sit to Stand: 6: Modified independent (Device/Increase time) Stand to Sit: 6: Modified independent (Device/Increase time)   Exercise    Balance Balance Balance Assessed: Yes Standardized Balance Assessment Standardized Balance Assessment: Dynamic Gait Index Dynamic Gait Index Level Surface: Normal Change in Gait Speed: Normal Gait with Horizontal Head Turns: Mild Impairment Gait with Vertical Head Turns: Mild Impairment Gait and Pivot Turn:  Normal Step Over Obstacle: Normal Step Around Obstacles: Normal Steps: Mild Impairment Total Score: 21   End of Session OT - End of Session Equipment Utilized During Treatment: Gait belt Activity Tolerance: Patient tolerated treatment well  Patient left: in chair;with call bell/phone within reach Nurse Communication: Mobility status  05/27/2012 Darrol Jump OTR/L Pager [phone] Office [phone]  Darrol Jump 05/27/2012, 9:21 AM

## 2012-05-27 NOTE — Evaluation (Signed)
Physical Therapy Evaluation Patient Details Name: [name] [name] MRN: [id number] DOB: 01-06-1961 Today's Date: 05/27/2012 Time: 3810-1751 PT Time Calculation (min): 15 min  PT Assessment / Plan / Recommendation Clinical Impression  Pt presents with a medical diagnosis of left parietal CVA with no residual deficits. Pt with no functional impairments or balance difficulties. No acute PT needed. Please reorder if necessary    PT Assessment  Patent does not need any further PT services    Follow Up Recommendations  No PT follow up;Supervision - Intermittent       lEquipment Recommendations  None recommended by PT          Precautions / Restrictions Precautions Precautions: Fall Restrictions Weight Bearing Restrictions: No         Mobility  Bed Mobility Bed Mobility: Not assessed Transfers Transfers: Sit to Stand;Stand to Sit Sit to Stand: 6: Modified independent (Device/Increase time) Stand to Sit: 6: Modified independent (Device/Increase time) Ambulation/Gait Ambulation/Gait Assistance: 5: Supervision Ambulation Distance (Feet): 200 Feet Assistive device: None Ambulation/Gait Assistance Details: Supervision for safety. No physical assist or cueing needed Gait Pattern: Within Functional Limits Gait velocity: normal gait speed Modified Rankin (Stroke Patients Only) Pre-Morbid Rankin Score: No symptoms Modified Rankin: No significant disability      PT Goals Acute Rehab PT Goals PT Goal Formulation: With patient  Visit Information  Last PT Received On: 05/27/12 Assistance Needed: +1 PT/OT Co-Evaluation/Treatment: Yes    Subjective Data  Subjective: "I feel fine, nothings wrong"   Prior Functioning  Home Living Lives With: Spouse Available Help at Discharge: Family;Available 24 hours/day Type of Home: Mobile home Home Access: Level entry Home Layout: One level Bathroom Shower/Tub: Tub/shower unit;Curtain Bathroom Toilet: Handicapped height Bathroom  Accessibility: Yes How Accessible: Accessible via walker Home Adaptive Equipment: Straight cane;Bedside commode/3-in-1;Walker - rolling Prior Function Level of Independence: Independent Able to Take Stairs?: Yes Driving: Yes Vocation: On disability Communication Communication: No difficulties Dominant Hand: Right    Cognition  Overall Cognitive Status: Appears within functional limits for tasks assessed/performed Arousal/Alertness: Awake/alert Orientation Level: Appears intact for tasks assessed Behavior During Session: [facility] for tasks performed    Extremity/Trunk Assessment Right Lower Extremity Assessment RLE ROM/Strength/Tone: Within functional levels RLE Sensation: WFL - Light Touch RLE Coordination: WFL - gross/fine motor Left Lower Extremity Assessment LLE ROM/Strength/Tone: Within functional levels LLE Sensation: WFL - Light Touch LLE Coordination: WFL - gross/fine motor   Balance Balance Balance Assessed: Yes Standardized Balance Assessment Standardized Balance Assessment: Dynamic Gait Index Dynamic Gait Index Level Surface: Normal Change in Gait Speed: Normal Gait with Horizontal Head Turns: Mild Impairment Gait with Vertical Head Turns: Mild Impairment Gait and Pivot Turn: Normal Step Over Obstacle: Normal Step Around Obstacles: Normal Steps: Mild Impairment Total Score: 21   End of Session PT - End of Session Equipment Utilized During Treatment: Gait belt Activity Tolerance: Patient tolerated treatment well Patient left: in chair;with call bell/phone within reach Nurse Communication: Mobility status   Ambrose Finland 05/27/2012, 9:02 AM  05/27/2012 Ambrose Finland DPT PAGER: [phone] OFFICE: [phone]

## 2012-05-27 NOTE — Progress Notes (Signed)
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 H/o pancreatitis   Consultants:  Neurology  Procedures:  CT head 5/29 = no acute findings previous left Pcomaneurysm without sequential hemorrhage  MRA head 5/29 = acute nonhemorrhagic infarct left parietal lobe, scattered white matter disease, slight progression left maxillary and ethmoid sinus disease  MRI head 5/29-signal loss within the left cavernous siphon secondary to stent, similar to his small vessel disease, no new small vessel occlusive  Chest x-ray 5/37 heart rate is stable borderline heart  Artery Doppler no significant ICA stenosis  Antibiotics:  none   Subjective  Well.  No further weakness.  Want sot go home.  No dizzyness, no cp no n/v   Objective   Interim History: Reviewed Neuro notes  Subjective: nad  Objective: Filed Vitals:   05/27/12 0500 05/27/12 0610 05/27/12 0817 05/27/12 1014  BP: 130/86 130/80 135/75 161/90  Pulse: 67 67 66 73  Temp: 98.2 F (36.8 C) 97.6 F (36.4 C) 98 F (36.7 C) 97.8 F (36.6 C)  TempSrc: Oral Oral Oral Oral  Resp: 18 18 18 18   Height:      Weight:      SpO2: 96% 95% 97% 96%    Intake/Output Summary (Last 24 hours) at 05/27/12 1325 Last data filed at 05/27/12 1304  Gross per 24 hour  Intake 1168.33 ml  Output      0 ml  Net 1168.33 ml    Exam:  General: alert oriented CM in NAD Cardiovascular:  s1 s2 no m/r/g, telel nad Respiratory: cta b, no added sound Abdomen: soft, nt/nd Skin no swelling, rash Neuro  grossly intact, moving all 4 limbs, reflexes 2/3, gait normal, pwer 5/5  Data Reviewed: Basic Metabolic Panel:  Lab 81/01/75 0540 05/26/12 1743 05/26/12 1716  NA 140 140 136  K 4.1 4.3 --  CL 103 106 100  CO2 25 -- 22  GLUCOSE 95 98 99  BUN 17 18 17   CREATININE 0.79 0.90 0.92  CALCIUM 9.6 -- 10.4  MG -- -- --  PHOS -- -- --   Liver Function Tests:  Lab 05/27/12 0540 05/26/12 1716  AST 24 25  ALT 36 42  ALKPHOS 115 129*  BILITOT 0.5 0.2*  PROT 6.5 7.3  ALBUMIN 3.9 4.4   No results found for this basename: LIPASE:5,AMYLASE:5 in the last 168 hours No results found for this basename: AMMONIA:5 in the last 168 hours CBC:  Lab 05/27/12 0540 05/26/12 1743 05/26/12 1716  WBC 8.9 -- 15.0*  NEUTROABS -- -- 11.7*  HGB 15.1 16.7 16.2  HCT 43.5 49.0 45.5  MCV 86.7 -- 85.5  PLT 186 -- 196   Cardiac Enzymes:  Lab 05/26/12 1717  CKTOTAL 117  CKMB 2.9  CKMBINDEX --  TROPONINI <0.30   BNP: No components found with this basename: POCBNP:5 CBG:  Lab 05/26/12 1737  GLUCAP 102*    No results found for this or any previous visit (from the past 240 hour(s)).   Studies:              All Imaging reviewed and is  as per above notation   Scheduled Meds:   . allopurinol  100 mg Oral Daily  . aspirin  324 mg Oral Once  . clopidogrel  75 mg Oral Q breakfast  . enoxaparin  40 mg Subcutaneous QHS  . ibuprofen  400 mg Oral Once  . losartan  100 mg Oral Daily  . metoprolol  50 mg Oral BID  . mulitivitamin with minerals  1 tablet Oral Daily  . omega-3 acid ethyl esters  1 g Oral Daily  . pantoprazole  40 mg Oral Daily  . traZODone  50 mg Oral QHS  . DISCONTD: Fish Oil  1 capsule Oral Daily   Continuous Infusions:   . sodium chloride 100 mL/hr (05/27/12 0007)  . DISCONTD: sodium chloride       Assessment/Plan: 1. L parietal of lobe infarct-per neurology. Patient will need to be on Plavix daily patient will get a TEE done and will need to be reassessed by neurology prior  to discharge 2. History CAD-stable at present time. Continue metoprolol 50 mg twice a day, ACE inhibitor most start 1000 mg daily.  Outpatient reevaluation by cardiology. 3. History of TIA and aneurysm-stable at present 4. ? Diabetes-follow up A1c 5. Hyperlipidemia-trigger for her 335, VLDL 67-will need statin therapy initiated and was started on Lipitor 40 mg daily 6. GERD continue PPI 7. GI bleed-needs review with gastroenterology as an outpatient 8. Tobacco abuse-cessation counseling outpatient 9. History of proctitis-? Secondary to elevated triglycerides. Needs followup testing as outpatient  Code Status: full  Family Communication: Discussed with daughter, and wife in room Disposition Plan: Home when TEE complete   Verneita Griffes, MD  [facility] Pager [phone] 05/27/2012, 1:25 PM    LOS: 1 day

## 2012-05-27 NOTE — Evaluation (Signed)
Speech Language Pathology Evaluation Patient Details Name: [name] [name] MRN: [id number] DOB: 02/11/1961 Today's Date: 05/27/2012 Time: 4098-1191 SLP Time Calculation (min): 16 min  Problem List:  Patient Active Problem List  Diagnoses  . GOUT  . HYPERTENSION  . HEMORRHOIDS-INTERNAL  . GERD  . Personal history of colonic polyps  . CVA (cerebral infarction)  . CAD (coronary artery disease)  . Cirrhosis of liver   Past Medical History:  Past Medical History  Diagnosis Date  . GERD (gastroesophageal reflux disease)   . Gout   . Adenomatous colon polyp 2009  . Lymphocytic colitis 2009  . COPD (chronic obstructive pulmonary disease)   . Hypertension   . NASH (nonalcoholic steatohepatitis)   . Hyperlipidemia   . TIA (transient ischemic attack)   . Asthma   . Alcohol abuse     abstinent since 1992  . Hemorrhoids   . Chronic back pain   . Personal history of colonic polyps 07/20/2002    Diminutive adenomas (3) 06/2002 diminutive adenoma (1) 2011    Past Surgical History:  Past Surgical History  Procedure Date  . Lumbar spine surgery     x 2  . Nasal sinus surgery   . 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 Colonoscopy multiple  . Band hemorrhoidectomy 2003    at sigmoidoscopy   HPI:  51 y.o. male admitted from home on 5/29 after sudden onset of slurred speech, difficulty finding words and right L&UE weakness.  MRI revealed a new left parietal infarct.   Assessment / Plan / Recommendation Clinical Impression  Demonstrates functional cognitive-communicative abilities without any further evidence of slurred speech or word finding diffiiculties.  No skilled SLP interventions at this time.    SLP Assessment  Patient does not need any further Speech Lanaguage Pathology Services    Follow Up Recommendations  None    Pertinent Vitals/Pain n/a    SLP Evaluation Prior Functioning  Cognitive/Linguistic Baseline: Within functional limits Type of Home: Mobile  home Lives With: Spouse Available Help at Discharge: Family;Available 24 hours/day Education: [employer] Vocation: On disability   Cognition  Overall Cognitive Status: Appears within functional limits for tasks assessed Arousal/Alertness: Awake/alert Orientation Level: Oriented X4 Attention: Alternating Alternating Attention: Appears intact Memory: Appears intact Awareness: Appears intact Problem Solving: Appears intact Executive Function: Reasoning;Sequencing;Organizing;Decision Making;Initiating;Self Monitoring;Self Correcting Sequencing: Appears intact Organizing: Appears intact Decision Making: Appears intact Initiating: Appears intact Self Monitoring: Appears intact Self Correcting: Appears intact Safety/Judgment: Appears intact    Comprehension  Auditory Comprehension Overall Auditory Comprehension: Appears within functional limits for tasks assessed Yes/No Questions: Within Functional Limits Commands: Within Functional Limits Conversation: Complex Visual Recognition/Discrimination Discrimination: Within Function Limits Reading Comprehension Reading Status: Within funtional limits    Expression Expression Primary Mode of Expression: Verbal Verbal Expression Overall Verbal Expression: Appears within functional limits for tasks assessed Initiation: No impairment Level of Generative/Spontaneous Verbalization: Conversation Repetition: No impairment Naming: No impairment Pragmatics: No impairment Written Expression Dominant Hand: Right Written Expression: Within Functional Limits   Oral / Motor Oral Motor/Sensory Function Overall Oral Motor/Sensory Function: Appears within functional limits for tasks assessed Motor Speech Overall Motor Speech: Appears within functional limits for tasks assessed     Wyline Copas, M.S.,CCC-SLP Pager [phone] 05/27/2012, 12:20 PM

## 2012-05-28 ENCOUNTER — Encounter (HOSPITAL_COMMUNITY)
Admission: EM | Disposition: A | Discharge status: Home / Self Care | Payer: Self-pay | Source: Ambulatory Visit | Attending: Family Medicine

## 2012-05-28 ENCOUNTER — Encounter (HOSPITAL_COMMUNITY): Payer: Self-pay | Admitting: Cardiology

## 2012-05-28 ENCOUNTER — Telehealth: Payer: Self-pay

## 2012-05-28 DIAGNOSIS — I25810 Atherosclerosis of coronary artery bypass graft(s) without angina pectoris: Secondary | ICD-10-CM

## 2012-05-28 DIAGNOSIS — I10 Essential (primary) hypertension: Secondary | ICD-10-CM

## 2012-05-28 DIAGNOSIS — F17200 Nicotine dependence, unspecified, uncomplicated: Secondary | ICD-10-CM

## 2012-05-28 DIAGNOSIS — G459 Transient cerebral ischemic attack, unspecified: Secondary | ICD-10-CM

## 2012-05-28 DIAGNOSIS — I6350 Cerebral infarction due to unspecified occlusion or stenosis of unspecified cerebral artery: Secondary | ICD-10-CM

## 2012-05-28 DIAGNOSIS — I6789 Other cerebrovascular disease: Secondary | ICD-10-CM

## 2012-05-28 HISTORY — PX: TEE WITHOUT CARDIOVERSION: SHX5443

## 2012-05-28 SURGERY — ECHOCARDIOGRAM, TRANSESOPHAGEAL
Anesthesia: Moderate Sedation

## 2012-05-28 MED ORDER — ATORVASTATIN CALCIUM 40 MG PO TABS: 40.0000 mg | ORAL_TABLET | Freq: Every day | ORAL | Status: DC

## 2012-05-28 MED ORDER — MIDAZOLAM HCL 10 MG/2ML IJ SOLN
INTRAMUSCULAR | Status: DC | PRN
  Administered 2012-05-28 (×2): 2 mg via INTRAVENOUS

## 2012-05-28 MED ORDER — BENZOCAINE 20 % MT SOLN
1.0000 "application " | OROMUCOSAL | Status: DC | PRN
  Filled 2012-05-28: qty 57

## 2012-05-28 MED ORDER — CLOPIDOGREL BISULFATE 75 MG PO TABS: 75.0000 mg | ORAL_TABLET | Freq: Every day | ORAL | Status: DC

## 2012-05-28 MED ORDER — MIDAZOLAM HCL 10 MG/2ML IJ SOLN: 10.0000 mg | Freq: Once | INTRAMUSCULAR | Status: DC

## 2012-05-28 MED ORDER — FENTANYL CITRATE 0.05 MG/ML IJ SOLN
INTRAMUSCULAR | Status: AC
  Filled 2012-05-28: qty 2

## 2012-05-28 MED ORDER — FENTANYL CITRATE 0.05 MG/ML IJ SOLN
INTRAMUSCULAR | Status: DC | PRN
  Administered 2012-05-28 (×2): 25 ug via INTRAVENOUS

## 2012-05-28 MED ORDER — MIDAZOLAM HCL 10 MG/2ML IJ SOLN
INTRAMUSCULAR | Status: AC
  Filled 2012-05-28: qty 2

## 2012-05-28 MED ORDER — SODIUM CHLORIDE 0.9 % IV SOLN
250.0000 mL | INTRAVENOUS | Status: DC | PRN
  Administered 2012-05-28: 250 mL via INTRAVENOUS

## 2012-05-28 MED ORDER — FENTANYL CITRATE 0.05 MG/ML IJ SOLN
250.0000 ug | Freq: Once | INTRAMUSCULAR | Status: DC
Start: 2012-05-28 — End: 2012-05-28

## 2012-05-28 NOTE — Progress Notes (Signed)
Stroke Team Progress Note  HISTORY [name] is a 51 y.o. 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 The patient was at home mowing his yard. He decided to use his push mower instead of the riding mower to get some exercise. He was able to do his backyard and start his front yard before he began overheating. He sat around and tried to cool off but developed a headache and some nausea. He went inside and showered, but still felt nauseous with a headache. He was sat to watch some TV but noticed his right side went numb. His speech also didn't make sense so his family brought him to the ER for evaluation. He is back to his normal self at this point. Patient was not a TPA candidate secondary to symptom resolution. He was admitted for further evaluation and treatment.  SUBJECTIVE Wife and daughter at bedside. Pt hopes for discharge after test today. Patient reports his symptoms have 100% resolved. Wife asked if a heat stroke could have caused theses symptoms - NO per MD.  OBJECTIVE Most recent Vital Signs: Filed Vitals:   05/27/12 1346 05/27/12 1816 05/27/12 2226 05/28/12 0552  BP: 135/86 123/71 125/72 95/69  Pulse: 71 60 62 69  Temp: 97.8 F (36.6 C) 97.6 F (36.4 C) 98 F (36.7 C) 97.9 F (36.6 C)  TempSrc: Oral Tympanic Oral Oral  Resp: 18 18 20 20   Height:      Weight:      SpO2: 98% 97% 98% 93%   CBG (last 3)  Basename 05/26/12 1737  GLUCAP 102*   Intake/Output from previous day: 05/30 0701 - 05/31 0700 In: 720 [P.O.:720] Out: -   IV Fluid Intake:     . sodium chloride 100 mL/hr (05/27/12 0007)   MEDICATIONS    . allopurinol  100 mg Oral Daily  . aspirin  324 mg Oral Once  . atorvastatin  40 mg Oral q1800  . clopidogrel  75 mg Oral Q breakfast  . enoxaparin  40 mg Subcutaneous QHS  . losartan  100 mg Oral Daily  . metoprolol  50 mg Oral BID  .  mulitivitamin with minerals  1 tablet Oral Daily  . omega-3 acid ethyl esters  1 g Oral Daily  . pantoprazole  40 mg Oral Daily  . traZODone  50 mg Oral QHS   PRN:  hydrocortisone, nitroGLYCERIN, senna-docusate  Diet:  NPO for TEE Activity:   Bathroom privileges with assistance DVT Prophylaxis:  SCDs   CLINICALLY SIGNIFICANT STUDIES Basic Metabolic Panel:  Lab 00/92/33 0540 05/26/12 1743 05/26/12 1716  NA 140 140 --  K 4.1 4.3 --  CL 103 106 --  CO2 25 -- 22  GLUCOSE 95 98 --  BUN 17 18 --  CREATININE 0.79 0.90 --  CALCIUM 9.6 -- 10.4  MG -- -- --  PHOS -- -- --   Liver Function Tests:  Lab 05/27/12 0540 05/26/12 1716  AST 24 25  ALT 36 42  ALKPHOS 115 129*  BILITOT 0.5 0.2*  PROT 6.5 7.3  ALBUMIN 3.9 4.4   CBC:  Lab 05/27/12 0540 05/26/12 1743 05/26/12 1716  WBC 8.9 -- 15.0*  NEUTROABS -- -- 11.7*  HGB 15.1 16.7 --  HCT 43.5 49.0 --  MCV 86.7 -- 85.5  PLT 186 -- 196   Coagulation:  Lab 05/26/12 1716  LABPROT  13.0  INR 0.96   Cardiac Enzymes:  Lab 05/26/12 1717  CKTOTAL 117  CKMB 2.9  CKMBINDEX --  TROPONINI <0.30   Urinalysis:  Lab 05/26/12 1741  COLORURINE YELLOW  LABSPEC 1.018  PHURINE 5.5  GLUCOSEU NEGATIVE  HGBUR NEGATIVE  BILIRUBINUR NEGATIVE  KETONESUR NEGATIVE  PROTEINUR NEGATIVE  UROBILINOGEN 0.2  NITRITE NEGATIVE  LEUKOCYTESUR NEGATIVE   Lipid Panel    Component Value Date/Time   CHOL 162 05/27/2012 0540   TRIG 335* 05/27/2012 0540   HDL 26* 05/27/2012 0540   CHOLHDL 6.2 05/27/2012 0540   VLDL 67* 05/27/2012 0540   LDLCALC 69 05/27/2012 0540   HgbA1C  Lab Results  Component Value Date   HGBA1C 5.8* 05/27/2012   Urine Drug Screen:     Component Value Date/Time   LABOPIA NONE DETECTED 05/26/2012 1741   COCAINSCRNUR NONE DETECTED 05/26/2012 1741   LABBENZ NONE DETECTED 05/26/2012 1741   AMPHETMU NONE DETECTED 05/26/2012 1741   THCU NONE DETECTED 05/26/2012 1741   LABBARB NONE DETECTED 05/26/2012 1741    CT of the brain  05/26/2012  No acute intracranial findings.  Previous left PCom aneurysm coiling without recurrent subarachnoid hemorrhage visible.   MRI of the brain  05/26/2012   1.  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 2.  Scattered white matter disease is otherwise stable. 3.  Slight progression of left maxillary and ethmoid sinus disease.    MRA of the brain  05/26/2012    1.  Signal loss within the left cavernous siphon secondary to the stent. 2.  Similar appearance of small vessel disease. 3.  No new small vessel occlusion.  The for no significant interval change.   2D Echocardiogram  EF 55-60% with no source of embolus.   TEE  Carotid Doppler  No internal carotid artery stenosis bilaterally. Vertebrals with antegrade flow bilaterally.   CXR  05/27/2012   No active lung disease.  Stable borderline cardiomegaly  EKG  normal sinus rhythm.   Therapy Recommendations PT -none, OT -none  Physical Exam  Pleasant middle aged patient not in distress.Awake alert. Afebrile. Head is nontraumatic. Neck is supple without bruit. Hearing is normal. Cardiac exam no murmur or gallop. Lungs are clear to auscultation. Distal pulses are well felt.  Neurological Exam: Awake  Alert oriented x 3. Normal speech and language.eye movements full without nystagmus.Fundi not visualized.Visual acuity adequate. Face symmetric. Tongue midline. Normal strength, tone, reflexes and coordination. Normal sensation. Gait deferred.   ASSESSMENT [name] is a 51 y.o. 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 Workup underway. On aspirin 325 mg orally every day prior to admission. Now on clopidogrel 75 mg orally every day for secondary stroke prevention. Patient with no resultant neuro symptoms.  -cigarette smoker, down from 3 ppd to 1 ppd -former ETOH abuser -obesity, Body mass index is 33.76 kg/(m^2).   Hospital day # 2  TREATMENT/PLAN -Continue clopidogrel 75 mg orally  every day for secondary stroke prevention. -TEE today. Arranged with [facility] cardiology, whom he has seen in the past. TEE to look for embolic source. If positive for PFO (patent foramen ovale), needs bilateral lower extremity dopplers to rule out DVT as source of stroke.  -if TEE negative, please schedule outpatient telemetry monitoring to assess patient for atrial fibrillation as source of stroke. May be arranged with patient's cardiologist.  -ok for discharge after TEE. -follow up Dr. Leonie Man in 2 months.  -D/W patient  and family  Steffanie Rainwater, ANP-BC, GNP-BC [facility] Pager: [phone] 05/28/2012 9:33 AM  Scribe for Dr. Antony Contras, [facility] Director. He has personally reviewed chart, pertinent data, examined the patient and developed the plan of care. Pager:  [phone]

## 2012-05-28 NOTE — Progress Notes (Signed)
  Echocardiogram Echocardiogram Transesophageal has been performed.  Qunicy Higinbotham, Orlena Sheldon 05/28/2012, 12:02 PM

## 2012-05-28 NOTE — CV Procedure (Signed)
See TEE report in [employer]; no SOE identified Kirk Ruths

## 2012-05-28 NOTE — Discharge Summary (Signed)
[facility] Discharge Summary  Date of Admission: 05/26/2012  4:42 PM Admitter: @ADMITPROV @   Date of Discharge5/31/2013 Attending Physician: Nita Sells, MD  Things to Follow-up on: Needs event monitor-Cardiology aware Continue Plavix    [name] [name] ENI:[id number] DOB: [birth year] DOA: 05/26/2012 PCP: Shirline Frees, MD, MD  Brief narrative: HPI: 51 year-old male with history of left PCA stenting, hypertension, cirrhosis presented to the ER because of numbness of the right upper and lower extremities. Around 1 PM today patient was moving his yard with push Mower when he felt weak and nauseous with headache. He decided take some rest and went inside his house and put the fan on. After that he also realized that he is having numbness of the right upper and lower extremity. Initially he had some weakness and he felt as if the upper and lower extremity on the right side did not exist. His family also noticed slurred speech-speech was not making sense  As his symptoms persisted he decided to come to the ER. In the ER MRI of the brain confirms left parietal stroke. Since he was outside the window he was not administered TPA-suymptoms also resolved by the time he came to the ED Neurology followed patient during Hospital stay, recommended Tob cessation, change to Plavix from ASA See below for further input.  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 H/o pancreatitis   Consultants:  Neurology  Procedures:  CT head 5/29 = no acute findings previous left Pcomaneurysm without sequential hemorrhage   MRA head 5/29 = acute nonhemorrhagic infarct left parietal lobe, scattered  white matter disease, slight progression left maxillary and ethmoid sinus disease   MRI head 5/29-signal loss within the left cavernous siphon secondary to stent, similar to his small vessel disease, no new small vessel occlusive   Chest x-ray 5/37 heart rate is stable borderline heart   Artery Doppler no significant ICA stenosis  TEE-No PFO  Antibiotics:  none  Hospital Course by problem list:  1. L parietal of lobe infarct-per neurology.  Patient had a TEE done which was neg, and neurology recommended that he have outpatient event monitor placed which has been arranged at [facility].   2. History CAD-stable at present time. Continue metoprolol 50 mg twice a day, ACE inhibitor most start 1000 mg daily.  Outpatient reevaluation by cardiology-Dr. Mare Ferrari will follow him up as out-patient 3. History of TIA and aneurysm-stable at present  4. ? Diabetes-follow up A1c was 5.8-counselled on diet and exercise 5. Hyperlipidemia-trigger for her 335, VLDL 67-will need statin therapy initiated and was started on Lipitor 40 mg daily  6. GERD continue PPI  7. GI bleed-needs review with gastroenterology as an outpatient  8. Tobacco abuse-cessation counseling outpatient  9. History of pancreatitis-? Secondary to elevated triglycerides. Needs followup testing as outpatient  Procedures Performed and pertinent labs: Dg Chest 2 View  05/27/2012  *RADIOLOGY REPORT*  Clinical Data: Recent stroke  CHEST - 2 VIEW  Comparison: Portable chest x-ray of 10/21/2010  Findings: The lungs are clear.  Mediastinal contours appear stable. The heart is borderline enlarged.  No bony abnormality is seen.  IMPRESSION: No active lung disease.  Stable borderline cardiomegaly.  Original Report Authenticated By: Joretta Bachelor, M.D.  Ct Head Wo Contrast  05/26/2012  *RADIOLOGY REPORT*  Clinical Data: Right-sided weakness with headache.  CT HEAD WITHOUT CONTRAST  Technique:  Contiguous axial images were obtained from  the base of the skull through the vertex without contrast.  Comparison: 05/03/2012 MR.  Cerebral angiography 06/10/2011.  CT head 11/17/2008.  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  No evidence for acute stroke, subarachnoid hemorrhage, other acute intracranial bleeding, hydrocephalus, mass lesion, or extra-axial fluid.  No significant atrophy or white matter disease.  Calvarium intact.  Negative sinuses, mastoids, and orbits.  Similar appearance to prior MR.  IMPRESSION: No acute intracranial findings.  Previous left PCom aneurysm coiling without recurrent subarachnoid hemorrhage visible.  Original Report Authenticated By: Staci Righter, M.D.   Mr [facility] Wo Contrast  05/26/2012  *RADIOLOGY REPORT*  Clinical Data:  Headache.  Right-sided weakness and numbness. 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  MRI HEAD WITHOUT CONTRAST MRA HEAD WITHOUT CONTRAST  Technique:  Multiplanar, multiecho pulse sequences of the brain and surrounding structures were obtained without intravenous contrast. Angiographic images of the head were obtained using MRA technique without contrast.  Comparison:  MRI head and MRA head of 05/03/2012.  MRI HEAD  Findings:  The diffusion weighted images demonstrate an acute non hemorrhagic infarct of the posterior left parietal and temporal lobe.  There is associated there is associated T2 and FLAIR hyperintensity with this infarct.  The there is some restricted diffusion within the most superior left parietal lobe as well.  Flow is present in the major intracranial arteries.  There is no hemorrhage or mass lesion.  Periventricular and subcortical white matter disease is otherwise stable.  The ventricles are of normal size.  The globes and orbits are intact.  Circumferential mucosal thickening in the left maxillary sinus and scattered ethmoid air cells has progressed.  The mastoid air cells are clear.  The  IMPRESSION:   1.  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 2.  Scattered white matter disease is otherwise stable. 3.  Slight progression of left maxillary and ethmoid sinus disease.  MRA HEAD  Findings: Signal loss within the anterior genu of the left cavernous siphon is secondary to the stent. The internal carotid arteries are otherwise within normal limits.  The right A1 segment is aplastic.  Segmental irregularity of MCA branch vessels is similar to the prior exams.  No focal occlusion is evident.  The right A1 segment is aplastic.  Both A2 segments fill from the left.  The vertebral arteries are codominant.  The left PICA origin is visualized and within normal limits.  The left AICA is dominant. Both posterior cerebral arteries originate from the basilar tip. Attenuation of distal PCA branch vessels is similar to the prior exam.  IMPRESSION:  1.  Signal loss within the left cavernous siphon secondary to the stent. 2.  Similar appearance of small vessel disease. 3.  No new small vessel occlusion.  The for no significant interval change.  Original Report Authenticated By: Resa Miner. MATTERN, M.D.   Mr Jodene Nam Head Wo Contrast  05/03/2012  *RADIOLOGY REPORT*  Clinical Data:  History of aneurysm coiling.  Abnormal vision.  MRI HEAD WITHOUT CONTRAST MRA HEAD WITHOUT CONTRAST  Technique:  Multiplanar, multiecho pulse sequences of the brain and surrounding structures were obtained without intravenous contrast. Angiographic images of the head were obtained using MRA technique without contrast.  Comparison:  MRI 06/10/2011, catheter angiogram 06/10/2011, MRI 10/29/2010  MRI HEAD  Findings:  Prior aneurysm coiling of left posterior communicating artery aneurysm without significant artifact from the coil.  Ventricle size is normal.  No acute infarct.  Small white matter hyperintensities in the cerebral white matter are similar to the prior study compatible with mild chronic microvascular ischemia. No intracranial  hemorrhage or mass.  Small enhancing lesion in the right occipital lobe was present on 10/29/2010 but not seen on today's study without intravenous contrast.  This may be a small vascular malformation.  No associated edema or hemorrhage is seen in this area on today's study.  Negative for mass lesion. Mild to chronic sinusitis.  IMPRESSION: Mild chronic microvascular ischemic changes in the white matter. No acute infarct, hemorrhage, or mass lesion.  MRA HEAD  Findings: Both vertebral arteries are patent to the basilar.  PICA is patent bilaterally.  Superior cerebellar and posterior cerebral arteries are patent bilaterally.  Internal carotid artery is patent bilaterally.  Focal area of signal loss in the left cavernous carotid compatible with artifact from aneurysm coils.  No recurrent aneurysm is identified. Anterior and middle cerebral arteries are patent.  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  Negative for recurrent aneurysm.  IMPRESSION: No change from prior angiogram 06/10/2011.  Aneurysm coiling left posterior communicating artery without recurrent aneurysm.  Original Report Authenticated By: Truett Perna, M.D.   Mr Brain Wo Contrast  05/26/2012  *RADIOLOGY REPORT*  Clinical Data:  Headache.  Right-sided weakness and numbness. 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  MRI HEAD WITHOUT CONTRAST MRA HEAD WITHOUT CONTRAST  Technique:  Multiplanar, multiecho pulse sequences of the brain and surrounding structures were obtained without intravenous contrast. Angiographic images of the head were obtained using MRA technique without contrast.  Comparison:  MRI head and MRA head of 05/03/2012.  MRI HEAD  Findings:  The diffusion weighted images demonstrate an acute non hemorrhagic infarct of the posterior left parietal and temporal lobe.  There is associated there is associated T2 and FLAIR hyperintensity with this infarct.  The there is some restricted diffusion within  the most superior left parietal lobe as well.  Flow is present in the major intracranial arteries.  There is no hemorrhage or mass lesion.  Periventricular and subcortical white matter disease is otherwise stable.  The ventricles are of normal size.  The globes and orbits are intact.  Circumferential mucosal thickening in the left maxillary sinus and scattered ethmoid air cells has progressed.  The mastoid air cells are clear.  The  IMPRESSION:  1.  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 2.  Scattered white matter disease is otherwise stable. 3.  Slight progression of left maxillary and ethmoid sinus disease.  MRA HEAD  Findings: Signal loss within the anterior genu of the left cavernous siphon is secondary to the stent. The internal carotid arteries are otherwise within normal limits.  The right A1 segment is aplastic.  Segmental irregularity of MCA branch vessels is similar to the prior exams.  No focal occlusion is evident.  The right A1 segment is aplastic.  Both A2 segments fill from the left.  The vertebral arteries are codominant.  The left PICA origin is visualized and within normal limits.  The left AICA is dominant. Both posterior cerebral arteries originate from the basilar tip. Attenuation of distal PCA branch vessels is similar to the prior exam.  IMPRESSION:  1.  Signal loss within the left cavernous siphon secondary to the stent. 2.  Similar appearance of small vessel disease. 3.  No new small vessel occlusion.  The for no significant interval change.  Original Report Authenticated By: Resa Miner. MATTERN, M.D.   Mr Brain Wo Contrast  05/03/2012  *RADIOLOGY REPORT*  Clinical Data:  History of aneurysm coiling.  Abnormal vision.  MRI HEAD WITHOUT CONTRAST MRA HEAD WITHOUT CONTRAST  Technique:  Multiplanar, multiecho pulse sequences of the brain and surrounding structures were obtained without intravenous contrast. Angiographic images of the head were obtained using MRA technique  without contrast.  Comparison:  MRI 06/10/2011, catheter angiogram 06/10/2011, MRI 10/29/2010  MRI HEAD  Findings:  Prior aneurysm coiling of left posterior communicating artery aneurysm without significant artifact from the coil.  Ventricle size is normal.  No acute infarct.  Small white matter hyperintensities in the cerebral white matter are similar to the prior study compatible with mild chronic microvascular ischemia. No intracranial hemorrhage or mass.  Small enhancing lesion in the right occipital lobe was present on 10/29/2010 but not seen on today's study without intravenous contrast.  This may be a small vascular malformation.  No associated edema or hemorrhage is seen in this area on today's study.  Negative for mass lesion. Mild to chronic sinusitis.  IMPRESSION: Mild chronic microvascular ischemic changes in the white matter. No acute infarct, hemorrhage, or mass lesion.  MRA HEAD  Findings: Both vertebral arteries are patent to the basilar.  PICA is patent bilaterally.  Superior cerebellar and posterior cerebral arteries are patent bilaterally.  Internal carotid artery is patent bilaterally.  Focal area of signal loss in the left cavernous carotid compatible with artifact from aneurysm coils.  No recurrent aneurysm is identified. Anterior and middle cerebral arteries are patent.  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  Negative for recurrent aneurysm.  IMPRESSION: No change from prior angiogram 06/10/2011.  Aneurysm coiling left posterior communicating artery without recurrent aneurysm.  Original Report Authenticated By: Truett Perna, M.D.    Discharge Vitals & PE:  BP 96/66  Pulse 69  Temp(Src) 97.4 F (36.3 C) (Oral)  Resp 20  Ht 5' 8"  (1.727 m)  Wt 100.7 kg (222 lb 0.1 oz)  BMI 33.76 kg/m2  SpO2 95% General: alert oriented CM in NAD Cardiovascular:  s1 s2 no m/r/g, telel nad Respiratory: cta b, no added sound Abdomen: soft, nt/nd Skin no swelling, rash Neuro grossly  intact, moving all 4 limbs, reflexes 2/3, gait normal, pwer 5/5   Discharge Labs: No results found for this or any previous visit (from the past 24 hour(s)).  Disposition and follow-up:   [name] [name] was discharged from in good condition.    Follow-up Appointments:  Follow-up Information    Follow up with Forbes Cellar, MD. Schedule an appointment as soon as possible for a visit in 2 months. (stroke doctor follow up)    Contact information:   [address] [facility] [phone]          Discharge Medications: Medication List  As of 05/28/2012  3:26 PM   TAKE these medications         allopurinol 100 MG tablet   Commonly known as: ZYLOPRIM   Take 100 mg by mouth daily.      aspirin 325 MG tablet   Take 325 mg by mouth daily.      atorvastatin 40 MG tablet   Commonly known as: LIPITOR   Take 1 tablet (40 mg total) by mouth  daily at 6 PM.      clopidogrel 75 MG tablet   Commonly known as: PLAVIX   Take 1 tablet (75 mg total) by mouth daily with breakfast.      esomeprazole 40 MG capsule   Commonly known as: NEXIUM   Take 40 mg by mouth 2 (two) times daily.      Fish Oil 1000 MG Caps   Take 1 capsule by mouth daily.      hydrocortisone 2.5 % rectal cream   Commonly known as: ANUSOL-HC   Place 1 application rectally 2 (two) times daily as needed. For irritation      losartan 100 MG tablet   Commonly known as: COZAAR   Take 100 mg by mouth daily.      metoprolol 50 MG tablet   Commonly known as: LOPRESSOR   Take 50 mg by mouth 2 (two) times daily.      Milk Thistle 500 MG Caps   Take 1,000 mg by mouth daily.      mulitivitamin with minerals Tabs   Take 1 tablet by mouth daily.      naproxen 500 MG tablet   Commonly known as: NAPROSYN   Take 500 mg by mouth 2 (two) times daily as needed. For pain/fever      nitroGLYCERIN 0.4 MG SL tablet   Commonly known as: NITROSTAT    Place 0.4 mg under the tongue every 5 (five) minutes as needed. For chest pain      traZODone 50 MG tablet   Commonly known as: DESYREL   Take 50 mg by mouth at bedtime.           Medications Discontinued During This Encounter  Medication Reason  . 0.9 %  sodium chloride infusion   . Fish Oil CAPS 1,000 mg Formulary change  . midazolam (VERSED) injection Patient Discharge  . fentaNYL (SUBLIMAZE) injection Patient Discharge  . 0.9 %  sodium chloride infusion   . benzocaine (HURRICAINE) 20 % mouth spray 1 application   . fentaNYL (SUBLIMAZE) injection 250 mcg   . midazolam (VERSED) injection 10 mg    > 40 minutes time spent preparing d/c summary, including direct face-face patient Time, contact with consultants, family and care coordination   Signed: Nita Sells 05/28/2012, 3:26 PM

## 2012-05-28 NOTE — Care Management Note (Signed)
    Page 1 of 1   05/28/2012     4:45:11 PM   CARE MANAGEMENT NOTE 05/28/2012  Patient:  [name] [name], [name] [name]   Account Number:  [id number]  Date Initiated:  05/27/2012  Documentation initiated by:  [facility]  Subjective/Objective Assessment:   Admitted with CVA.Lives with spouse.     Action/Plan:   PT eval-no d/c needs  OT eval-no d/c needs   Anticipated DC Date:  05/29/2012   Anticipated DC Plan:  [place]  CM consult      Choice offered to / List presented to:             Status of service:  Completed, signed off Medicare Important Message given?   (If response is "NO", the following Medicare IM given date fields will be blank) Date Medicare IM given:   Date Additional Medicare IM given:    Discharge Disposition:  HOME/SELF CARE  Per UR Regulation:  Reviewed for med. necessity/level of care/duration of stay  If discussed at [place] of Stay Meetings, dates discussed:    Comments:

## 2012-05-28 NOTE — Interval H&P Note (Signed)
History and Physical Interval Note:  05/28/2012 11:35 AM  [name] [name]  has presented today for surgery, with the diagnosis of stroke  The various methods of treatment have been discussed with the patient and family. After consideration of risks, benefits and other options for treatment, the patient has consented to  Procedure(s) (LRB): TRANSESOPHAGEAL ECHOCARDIOGRAM (TEE) (N/A) as a surgical intervention .  The patients' history has been reviewed, patient examined, no change in status, stable for surgery.  I have reviewed the patients' chart and labs.  Questions were answered to the patient's satisfaction.     Kirk Ruths

## 2012-05-28 NOTE — H&P (View-Only) (Signed)
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  No further weakness.  Want sot go home.  No dizzyness, no cp no n/v   Objective   Interim History: Reviewed Neuro notes  Subjective: nad  Objective: Filed Vitals:   05/27/12 0500 05/27/12 0610 05/27/12 0817 05/27/12 1014  BP: 130/86 130/80 135/75 161/90  Pulse: 67 67 66 73  Temp: 98.2 F (36.8 C) 97.6 F (36.4 C) 98 F (36.7 C) 97.8 F (36.6 C)  TempSrc: Oral Oral Oral Oral  Resp: 18 18 18 18   Height:      Weight:      SpO2: 96% 95% 97% 96%    Intake/Output Summary (Last 24 hours) at 05/27/12 1325 Last data filed at 05/27/12 1304  Gross per 24 hour  Intake 1168.33 ml  Output      0 ml  Net 1168.33 ml    Exam:  General: alert oriented CM in NAD Cardiovascular:  s1 s2 no m/r/g, telel nad Respiratory: cta b, no added sound Abdomen: soft, nt/nd Skin no swelling, rash Neuro  grossly intact, moving all 4 limbs, reflexes 2/3, gait normal, pwer 5/5  Data Reviewed: Basic Metabolic Panel:  Lab 54/65/03 0540 05/26/12 1743 05/26/12 1716  NA 140 140 136  K 4.1 4.3 --  CL 103 106 100  CO2 25 -- 22  GLUCOSE 95 98 99  BUN 17 18 17   CREATININE 0.79 0.90 0.92  CALCIUM 9.6 -- 10.4  MG -- -- --  PHOS -- -- --   Liver Function Tests:  Lab 05/27/12 0540 05/26/12 1716  AST 24 25  ALT 36 42  ALKPHOS 115 129*  BILITOT 0.5 0.2*  PROT 6.5 7.3  ALBUMIN 3.9 4.4   No results found for this basename: LIPASE:5,AMYLASE:5 in the last 168 hours No results found for this basename: AMMONIA:5 in the last 168 hours CBC:  Lab 05/27/12 0540 05/26/12 1743 05/26/12 1716  WBC 8.9 -- 15.0*  NEUTROABS -- -- 11.7*  HGB 15.1 16.7 16.2  HCT 43.5 49.0 45.5  MCV 86.7 -- 85.5  PLT 186 -- 196   Cardiac Enzymes:  Lab 05/26/12 1717  CKTOTAL 117  CKMB 2.9  CKMBINDEX --  TROPONINI <0.30   BNP: No components found with this basename: POCBNP:5 CBG:  Lab 05/26/12 1737  GLUCAP 102*    No results found for this or any previous visit (from the past 240 hour(s)).   Studies:              All Imaging reviewed and is  as per above notation   Scheduled Meds:   . allopurinol  100 mg Oral Daily  . aspirin  324 mg Oral Once  . clopidogrel  75 mg Oral Q breakfast  . enoxaparin  40 mg Subcutaneous QHS  . ibuprofen  400 mg Oral Once  . losartan  100 mg Oral Daily  . metoprolol  50 mg Oral BID  . mulitivitamin with minerals  1 tablet Oral Daily  . omega-3 acid ethyl esters  1 g Oral Daily  . pantoprazole  40 mg Oral Daily  . traZODone  50 mg Oral QHS  . DISCONTD: Fish Oil  1 capsule Oral Daily   Continuous Infusions:   . sodium chloride 100 mL/hr (05/27/12 0007)  . DISCONTD: sodium chloride       Assessment/Plan: 1. L parietal of lobe infarct-per neurology. Patient will need to be on Plavix daily patient will get a TEE done and will need to be reassessed by neurology prior  to discharge 2. History CAD-stable at present time. Continue metoprolol 50 mg twice a day, ACE inhibitor most start 1000 mg daily.  Outpatient reevaluation by cardiology. 3. History of TIA and aneurysm-stable at present 4. ? Diabetes-follow up A1c 5. Hyperlipidemia-trigger for her 335, VLDL 67-will need statin therapy initiated and was started on Lipitor 40 mg daily 6. GERD continue PPI 7. GI bleed-needs review with gastroenterology as an outpatient 8. Tobacco abuse-cessation counseling outpatient 9. History of proctitis-? Secondary to elevated triglycerides. Needs followup testing as outpatient  Code Status: full  Family Communication: Discussed with daughter, and wife in room Disposition Plan: Home when TEE complete   Verneita Griffes, MD  [facility] Pager [phone] 05/27/2012, 1:25 PM    LOS: 1 day

## 2012-05-28 NOTE — Progress Notes (Signed)
Spoke with Dr Verlon Au - event monitor requested. Called office and they will arrange for monitor and f/u with Dr Mare Ferrari. They will precert monitor and call pt Monday with info.

## 2012-06-01 ENCOUNTER — Encounter (HOSPITAL_COMMUNITY): Payer: Self-pay | Admitting: Cardiology

## 2012-06-03 NOTE — Telephone Encounter (Signed)
Patient call back to make appt. For 06/04/2012

## 2012-06-04 ENCOUNTER — Encounter (INDEPENDENT_AMBULATORY_CARE_PROVIDER_SITE_OTHER): Payer: Medicare Other

## 2012-06-04 DIAGNOSIS — I6789 Other cerebrovascular disease: Secondary | ICD-10-CM

## 2012-06-29 ENCOUNTER — Encounter: Payer: Medicare Other | Admitting: Cardiology

## 2012-07-07 ENCOUNTER — Telehealth: Payer: Self-pay | Admitting: *Deleted

## 2012-07-07 NOTE — Telephone Encounter (Signed)
Normal holter-NSR per  follow up with PCP since no show for office visit,  Dr. Mare Ferrari.  Left message to call back

## 2012-07-13 NOTE — Telephone Encounter (Signed)
Left message to call back  

## 2012-09-21 NOTE — Telephone Encounter (Signed)
Patient no showed for ov and never called back regarding monitor

## 2012-10-05 ENCOUNTER — Other Ambulatory Visit (HOSPITAL_COMMUNITY): Payer: Self-pay | Admitting: Interventional Radiology

## 2012-10-05 DIAGNOSIS — I729 Aneurysm of unspecified site: Secondary | ICD-10-CM

## 2012-11-08 ENCOUNTER — Ambulatory Visit (HOSPITAL_COMMUNITY)
Admission: RE | Admit: 2012-11-08 | Discharge: 2012-11-08 | Disposition: A | Discharge status: Home / Self Care | Payer: Medicare Other | Source: Ambulatory Visit | Attending: Interventional Radiology | Admitting: Interventional Radiology

## 2012-11-08 ENCOUNTER — Ambulatory Visit (HOSPITAL_COMMUNITY): Payer: Medicare Other

## 2012-11-08 DIAGNOSIS — I729 Aneurysm of unspecified site: Secondary | ICD-10-CM | POA: Diagnosis not present

## 2012-11-16 ENCOUNTER — Emergency Department (HOSPITAL_COMMUNITY)
Admission: EM | Admit: 2012-11-16 | Discharge: 2012-11-16 | Disposition: A | Discharge status: Home / Self Care | Payer: Medicare Other | Attending: Emergency Medicine | Admitting: Emergency Medicine

## 2012-11-16 ENCOUNTER — Encounter (HOSPITAL_COMMUNITY): Payer: Self-pay | Admitting: Emergency Medicine

## 2012-11-16 DIAGNOSIS — Z8619 Personal history of other infectious and parasitic diseases: Secondary | ICD-10-CM | POA: Insufficient documentation

## 2012-11-16 DIAGNOSIS — J449 Chronic obstructive pulmonary disease, unspecified: Secondary | ICD-10-CM | POA: Insufficient documentation

## 2012-11-16 DIAGNOSIS — M549 Dorsalgia, unspecified: Secondary | ICD-10-CM | POA: Insufficient documentation

## 2012-11-16 DIAGNOSIS — G8929 Other chronic pain: Secondary | ICD-10-CM | POA: Diagnosis not present

## 2012-11-16 DIAGNOSIS — Z860100 Personal history of colon polyps, unspecified: Secondary | ICD-10-CM | POA: Insufficient documentation

## 2012-11-16 DIAGNOSIS — Z8673 Personal history of transient ischemic attack (TIA), and cerebral infarction without residual deficits: Secondary | ICD-10-CM | POA: Diagnosis not present

## 2012-11-16 DIAGNOSIS — F17200 Nicotine dependence, unspecified, uncomplicated: Secondary | ICD-10-CM | POA: Diagnosis not present

## 2012-11-16 DIAGNOSIS — K089 Disorder of teeth and supporting structures, unspecified: Secondary | ICD-10-CM | POA: Diagnosis not present

## 2012-11-16 DIAGNOSIS — K0889 Other specified disorders of teeth and supporting structures: Secondary | ICD-10-CM

## 2012-11-16 DIAGNOSIS — Z7982 Long term (current) use of aspirin: Secondary | ICD-10-CM | POA: Diagnosis not present

## 2012-11-16 DIAGNOSIS — Z79899 Other long term (current) drug therapy: Secondary | ICD-10-CM | POA: Diagnosis not present

## 2012-11-16 DIAGNOSIS — E785 Hyperlipidemia, unspecified: Secondary | ICD-10-CM | POA: Diagnosis not present

## 2012-11-16 DIAGNOSIS — I10 Essential (primary) hypertension: Secondary | ICD-10-CM | POA: Insufficient documentation

## 2012-11-16 DIAGNOSIS — J4489 Other specified chronic obstructive pulmonary disease: Secondary | ICD-10-CM | POA: Insufficient documentation

## 2012-11-16 DIAGNOSIS — K219 Gastro-esophageal reflux disease without esophagitis: Secondary | ICD-10-CM | POA: Diagnosis not present

## 2012-11-16 DIAGNOSIS — Z8719 Personal history of other diseases of the digestive system: Secondary | ICD-10-CM | POA: Insufficient documentation

## 2012-11-16 MED ORDER — HYDROCODONE-ACETAMINOPHEN 5-325 MG PO TABS
2.0000 | ORAL_TABLET | Freq: Once | ORAL | Status: AC
  Administered 2012-11-16: 2 via ORAL
  Filled 2012-11-16: qty 2

## 2012-11-16 MED ORDER — HYDROCODONE-ACETAMINOPHEN 5-325 MG PO TABS: 2.0000 | ORAL_TABLET | ORAL | Status: DC | PRN

## 2012-11-16 NOTE — ED Notes (Signed)
All bottom teeth hurting x 3 weeks they are falling out

## 2012-11-16 NOTE — ED Provider Notes (Signed)
History   This chart was scribed for Orlie Dakin, MD by Ludger Nutting, ED Scribe. This patient was seen in room TR07C/TR07C and the patient's care was started at 12:20 PM.   CSN: [id number]  Arrival date & time 11/16/12  1031   First MD Initiated Contact with Patient 11/16/12 1220      No chief complaint on file.   (Consider location/radiation/quality/duration/timing/severity/associated sxs/prior treatment) The history is provided by the patient. No language interpreter was used.    [name] [name] is a 51 y.o. male who presents to the Emergency Department complaining of bottom teeth pain for 3 weeks. Pt reports 1 tooth fell out 4 weeks ago and since then the rest of the bottom teeth feel loose. Pt reports using Advil with some relief. He states last seeing a dentist 1 year ago. Similar problem happened 4 years ago and now pt does not have any upper teeth. He denies facial swelling, sore throat, and headache.   Pt is a current everyday smoker but denies alcohol use.   Past Medical History  Diagnosis Date  . GERD (gastroesophageal reflux disease)   . Gout   . Adenomatous colon polyp 2009  . Lymphocytic colitis 2009  . COPD (chronic obstructive pulmonary disease)   . Hypertension   . NASH (nonalcoholic steatohepatitis)   . Hyperlipidemia   . TIA (transient ischemic attack)   . Asthma   . Alcohol abuse     abstinent since 1992  . Hemorrhoids   . Chronic back pain   . Personal history of colonic polyps 07/20/2002    Diminutive adenomas (3) 06/2002 diminutive adenoma (1) 2011     Past Surgical History  Procedure Date  . Lumbar spine surgery     x 2  . Nasal sinus surgery   . 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 Colonoscopy multiple  . Band hemorrhoidectomy 2003    at sigmoidoscopy  . Tee without cardioversion 05/28/2012    Procedure: TRANSESOPHAGEAL ECHOCARDIOGRAM (TEE);  Surgeon: Lelon Perla, MD;  Location: [facility] ENDOSCOPY;  Service: Cardiovascular;  Laterality:  N/A;    Family History  Problem Relation Age of Onset  . Liver disease Mother   . Liver cancer Mother   . Colon cancer Neg Hx   . Heart disease      multiple family members on maternal and paternal side of family  . Breast cancer Mother   . Diabetes Sister   . Diabetes Paternal Grandmother     History  Substance Use Topics  . Smoking status: Current Every Day Smoker -- 1.0 packs/day for 41 years    Types: Cigarettes  . Smokeless tobacco: Never Used  . Alcohol Use: No      Review of Systems  Constitutional: Negative.   HENT: Positive for dental problem (in bottom teeth). Negative for sore throat and facial swelling.   Respiratory: Negative.   Cardiovascular: Negative.   Gastrointestinal: Negative.   Musculoskeletal: Negative.   Skin: Negative.   Neurological: Negative.  Negative for headaches.  Hematological: Negative.   Psychiatric/Behavioral: Negative.     Allergies  Penicillins-breaks out into hives.   Home Medications   Current Outpatient Rx  Name  Route  Sig  Dispense  Refill  . ALLOPURINOL 100 MG PO TABS   Oral   Take 100 mg by mouth daily.         . ASPIRIN 325 MG PO TABS   Oral   Take 325 mg by mouth daily.         Marland Kitchen  ESOMEPRAZOLE MAGNESIUM 40 MG PO CPDR   Oral   Take 40 mg by mouth 2 (two) times daily.         Marland Kitchen HYDROCORTISONE 2.5 % RE CREA   Rectal   Place 1 application rectally 2 (two) times daily as needed. For irritation         . LOSARTAN POTASSIUM 100 MG PO TABS   Oral   Take 100 mg by mouth daily.         Marland Kitchen METOPROLOL TARTRATE 50 MG PO TABS   Oral   Take 50 mg by mouth 2 (two) times daily.         Marland Kitchen MILK THISTLE 500 MG PO CAPS   Oral   Take 1,000 mg by mouth daily.          . ADULT MULTIVITAMIN W/MINERALS CH   Oral   Take 1 tablet by mouth daily.         Marland Kitchen NAPROXEN 500 MG PO TABS   Oral   Take 500 mg by mouth 2 (two) times daily with a meal. For pain/fever         . NITROGLYCERIN 0.4 MG SL SUBL    Sublingual   Place 0.4 mg under the tongue every 5 (five) minutes as needed. For chest pain         . FISH OIL 1000 MG PO CAPS   Oral   Take 1 capsule by mouth daily.         . TRAZODONE HCL 50 MG PO TABS   Oral   Take 50 mg by mouth at bedtime.           There were no vitals taken for this visit.  Physical Exam  Nursing note and vitals reviewed. Constitutional: He appears well-developed and well-nourished.  HENT:  Head: Normocephalic and atraumatic.  Right Ear: External ear normal.  Left Ear: External ear normal.       Poor dentition.  Lower incisors are loose and decayed.   Eyes: Conjunctivae normal are normal. Pupils are equal, round, and reactive to light.  Neck: Neck supple. No tracheal deviation present. No thyromegaly present.  Cardiovascular: Normal rate, regular rhythm and normal heart sounds.   No murmur heard. Pulmonary/Chest: Effort normal and breath sounds normal.  Abdominal: Soft. Bowel sounds are normal. He exhibits no distension. There is no tenderness.  Musculoskeletal: Normal range of motion. He exhibits no edema and no tenderness.  Lymphadenopathy:    He has no cervical adenopathy.  Neurological: He is alert. Coordination normal.  Skin: Skin is warm and dry. No rash noted.  Psychiatric: He has a normal mood and affect.    ED Course  Procedures (including critical care time)   DIAGNOSTIC STUDIES: None performed  COORDINATION OF CARE:  12:26 PM Discussed discharge plan which includes antibiotics, pain medications, and follow up with dentist with pt and pt agreed to plan. Also advised pt to follow up and pt agreed.   Labs Reviewed - No data to display No results found.   No diagnosis found.    MDM  Plan prescription Vicodin, 6 tablets dental referral Diagnosis dental pain       I personally performed the services described in this documentation, which was scribed in my presence. The recorded information has been reviewed and is  accurate.    Orlie Dakin, MD 11/16/12 [phone]

## 2012-11-16 NOTE — ED Notes (Signed)
States pain bottom teeth, states one tooth fell out 4 weeks ago, states 3 teeth in bottom are all loose, all top teeth removed, unable to eat

## 2013-07-11 ENCOUNTER — Encounter (HOSPITAL_COMMUNITY): Payer: Self-pay | Admitting: Emergency Medicine

## 2013-07-11 ENCOUNTER — Emergency Department (HOSPITAL_COMMUNITY)
Admission: EM | Admit: 2013-07-11 | Discharge: 2013-07-11 | Disposition: A | Discharge status: Home / Self Care | Payer: Medicare Other | Attending: Emergency Medicine | Admitting: Emergency Medicine

## 2013-07-11 DIAGNOSIS — I10 Essential (primary) hypertension: Secondary | ICD-10-CM | POA: Insufficient documentation

## 2013-07-11 DIAGNOSIS — J449 Chronic obstructive pulmonary disease, unspecified: Secondary | ICD-10-CM | POA: Diagnosis not present

## 2013-07-11 DIAGNOSIS — F17200 Nicotine dependence, unspecified, uncomplicated: Secondary | ICD-10-CM | POA: Diagnosis not present

## 2013-07-11 DIAGNOSIS — Z862 Personal history of diseases of the blood and blood-forming organs and certain disorders involving the immune mechanism: Secondary | ICD-10-CM | POA: Insufficient documentation

## 2013-07-11 DIAGNOSIS — M109 Gout, unspecified: Secondary | ICD-10-CM | POA: Diagnosis not present

## 2013-07-11 DIAGNOSIS — Z8679 Personal history of other diseases of the circulatory system: Secondary | ICD-10-CM | POA: Insufficient documentation

## 2013-07-11 DIAGNOSIS — Z8673 Personal history of transient ischemic attack (TIA), and cerebral infarction without residual deficits: Secondary | ICD-10-CM | POA: Insufficient documentation

## 2013-07-11 DIAGNOSIS — Z880 Allergy status to penicillin: Secondary | ICD-10-CM | POA: Insufficient documentation

## 2013-07-11 DIAGNOSIS — Z79899 Other long term (current) drug therapy: Secondary | ICD-10-CM | POA: Diagnosis not present

## 2013-07-11 DIAGNOSIS — J4489 Other specified chronic obstructive pulmonary disease: Secondary | ICD-10-CM | POA: Insufficient documentation

## 2013-07-11 DIAGNOSIS — Z8719 Personal history of other diseases of the digestive system: Secondary | ICD-10-CM | POA: Diagnosis not present

## 2013-07-11 DIAGNOSIS — Z8639 Personal history of other endocrine, nutritional and metabolic disease: Secondary | ICD-10-CM | POA: Insufficient documentation

## 2013-07-11 DIAGNOSIS — G8929 Other chronic pain: Secondary | ICD-10-CM | POA: Diagnosis not present

## 2013-07-11 DIAGNOSIS — Z860100 Personal history of colon polyps, unspecified: Secondary | ICD-10-CM | POA: Insufficient documentation

## 2013-07-11 DIAGNOSIS — H81399 Other peripheral vertigo, unspecified ear: Secondary | ICD-10-CM | POA: Insufficient documentation

## 2013-07-11 DIAGNOSIS — K219 Gastro-esophageal reflux disease without esophagitis: Secondary | ICD-10-CM | POA: Insufficient documentation

## 2013-07-11 DIAGNOSIS — Z7982 Long term (current) use of aspirin: Secondary | ICD-10-CM | POA: Diagnosis not present

## 2013-07-11 LAB — COMPREHENSIVE METABOLIC PANEL
ALT: 50 U/L (ref 0–53)
AST: 31 U/L (ref 0–37)
Alkaline Phosphatase: 125 U/L — ABNORMAL HIGH (ref 39–117)
CO2: 24 mEq/L (ref 19–32)
Calcium: 10.2 mg/dL (ref 8.4–10.5)
Chloride: 100 mEq/L (ref 96–112)
GFR calc Af Amer: >90 mL/min (ref >90)
GFR calc non Af Amer: >90 mL/min (ref >90)
Glucose, Bld: 99 mg/dL (ref 70–99)
Potassium: 4 mEq/L (ref 3.5–5.1)
Sodium: 136 mEq/L (ref 135–145)

## 2013-07-11 LAB — CBC WITH DIFFERENTIAL/PLATELET
Basophils Absolute: 0 10*3/uL (ref 0.0–0.1)
Eosinophils Relative: 2 % (ref 0–5)
Lymphocytes Relative: 42 % (ref 12–46)
Lymphs Abs: 3.1 10*3/uL (ref 0.7–4.0)
Neutro Abs: 3.6 10*3/uL (ref 1.7–7.7)
Neutrophils Relative %: 50 % (ref 43–77)
Platelets: 181 10*3/uL (ref 150–400)
RBC: 5.46 MIL/uL (ref 4.22–5.81)
RDW: 12.1 % (ref 11.5–15.5)
WBC: 7.3 10*3/uL (ref 4.0–10.5)

## 2013-07-11 MED ORDER — MECLIZINE HCL 50 MG PO TABS: 50.0000 mg | ORAL_TABLET | Freq: Three times a day (TID) | ORAL | Status: DC | PRN

## 2013-07-11 MED ORDER — MECLIZINE HCL 50 MG PO TABS: 25.0000 mg | ORAL_TABLET | Freq: Three times a day (TID) | ORAL | Status: DC | PRN

## 2013-07-11 MED ORDER — SODIUM CHLORIDE 0.9 % IV SOLN
INTRAVENOUS | Status: DC
  Administered 2013-07-11: 10:00:00 via INTRAVENOUS

## 2013-07-11 MED ORDER — MECLIZINE HCL 25 MG PO TABS
25.0000 mg | ORAL_TABLET | Freq: Once | ORAL | Status: AC
  Administered 2013-07-11: 25 mg via ORAL
  Filled 2013-07-11: qty 1

## 2013-07-11 NOTE — ED Notes (Signed)
Pt states he has been having "dizzy spells" x 3-4 days. Pt states he gets nauseous with dizzy spells and has intermittent chest pain for the same amount of time as the dizziness. Pt denies numbness and tingling. Pt states when pain comes chest is tabbing 6/10. Pt states he has been having hot flashes. Pt states his BP was high this morning.

## 2013-07-11 NOTE — ED Notes (Signed)
Pt alert and mentating appropriately upon d/c. Pt given d/c teaching and follow up care instructions. Pt verbalizes understanding upon d/c and has no further questions upon d/c. Pt ambulatory leaving with significant other and d/c teaching. Pt educated on prescription and use. NAD noted upon d/c.

## 2013-07-11 NOTE — ED Notes (Signed)
Dr Zenia Resides at bedside

## 2013-07-11 NOTE — ED Provider Notes (Signed)
History    CSN: [id number] Arrival date & time 07/11/13  0844  First MD Initiated Contact with Patient 07/11/13 [phone]     Chief Complaint  Patient presents with  . Dizziness   (Consider location/radiation/quality/duration/timing/severity/associated sxs/prior Treatment) The history is provided by the patient.   patient here complaining of dizzy spells x3-4 days which lasts for minutes. No associated symptoms with this. Denies any anginal type chest pain with this. No severe headaches. No neck pain. No syncope or near-syncope. No recent fever, vomiting, diarrhea, black or bloody stools. No treatment used. Denies any associated symptoms with standing. No ear pain or recent cold symptoms. Denies any severe ataxia. Symptoms resolved spontaneously. Past Medical History  Diagnosis Date  . GERD (gastroesophageal reflux disease)   . Gout   . Adenomatous colon polyp 2009  . Lymphocytic colitis 2009  . COPD (chronic obstructive pulmonary disease)   . Hypertension   . NASH (nonalcoholic steatohepatitis)   . Hyperlipidemia   . TIA (transient ischemic attack)   . Asthma   . Alcohol abuse     abstinent since 1992  . Hemorrhoids   . Chronic back pain   . Personal history of colonic polyps 07/20/2002    Diminutive adenomas (3) 06/2002 diminutive adenoma (1) 2011    Past Surgical History  Procedure Laterality Date  . Lumbar spine surgery      x 2  . Nasal sinus surgery    . 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 Colonoscopy  multiple  . Band hemorrhoidectomy  2003    at sigmoidoscopy  . Tee without cardioversion  05/28/2012    Procedure: TRANSESOPHAGEAL ECHOCARDIOGRAM (TEE);  Surgeon: Lelon Perla, MD;  Location: [facility] ENDOSCOPY;  Service: Cardiovascular;  Laterality: N/A;   Family History  Problem Relation Age of Onset  . Liver disease Mother   . Liver cancer Mother   . Colon cancer Neg Hx   . Heart disease      multiple family members on maternal and paternal side of family  .  Breast cancer Mother   . Diabetes Sister   . Diabetes Paternal Grandmother    History  Substance Use Topics  . Smoking status: Current Every Day Smoker -- 1.00 packs/day for 41 years    Types: Cigarettes  . Smokeless tobacco: Never Used  . Alcohol Use: No    Review of Systems  All other systems reviewed and are negative.    Allergies  Penicillins  Home Medications   Current Outpatient Rx  Name  Route  Sig  Dispense  Refill  . allopurinol (ZYLOPRIM) 100 MG tablet   Oral   Take 100 mg by mouth daily.         Marland Kitchen aspirin 325 MG tablet   Oral   Take 325 mg by mouth daily.         Marland Kitchen esomeprazole (NEXIUM) 40 MG capsule   Oral   Take 40 mg by mouth 2 (two) times daily.         Marland Kitchen losartan (COZAAR) 100 MG tablet   Oral   Take 100 mg by mouth daily.         . metoprolol (LOPRESSOR) 100 MG tablet   Oral   Take 100 mg by mouth 2 (two) times daily.         . Milk Thistle 500 MG CAPS   Oral   Take 1,000 mg by mouth daily.          Marland Kitchen  Multiple Vitamin (MULITIVITAMIN WITH MINERALS) TABS   Oral   Take 1 tablet by mouth daily.         . naproxen (NAPROSYN) 500 MG tablet   Oral   Take 500 mg by mouth 2 (two) times daily with a meal. For pain/fever         . nitroGLYCERIN (NITROSTAT) 0.4 MG SL tablet   Sublingual   Place 0.4 mg under the tongue every 5 (five) minutes as needed. For chest pain         . Omega-3 Fatty Acids (FISH OIL) 1000 MG CAPS   Oral   Take 1 capsule by mouth daily.         . traZODone (DESYREL) 50 MG tablet   Oral   Take 50 mg by mouth at bedtime.          BP 164/108  Pulse 68  Temp(Src) 98.4 F (36.9 C) (Oral)  Resp 17  SpO2 94% Physical Exam  Nursing note and vitals reviewed. Constitutional: He is oriented to person, place, and time. He appears well-developed and well-nourished.  Non-toxic appearance. No distress.  HENT:  Head: Normocephalic and atraumatic.  Eyes: Conjunctivae, EOM and lids are normal. Pupils are  equal, round, and reactive to light.  Neck: Normal range of motion. Neck supple. No tracheal deviation present. No mass present.  Cardiovascular: Normal rate, regular rhythm and normal heart sounds.  Exam reveals no gallop.   No murmur heard. Pulmonary/Chest: Effort normal and breath sounds normal. No stridor. No respiratory distress. He has no decreased breath sounds. He has no wheezes. He has no rhonchi. He has no rales.  Abdominal: Soft. Normal appearance and bowel sounds are normal. He exhibits no distension. There is no tenderness. There is no rebound and no CVA tenderness.  Musculoskeletal: Normal range of motion. He exhibits no edema and no tenderness.  Neurological: He is alert and oriented to person, place, and time. He has normal strength. No cranial nerve deficit or sensory deficit. Coordination and gait normal. GCS eye subscore is 4. GCS verbal subscore is 5. GCS motor subscore is 6.  Nystagmus horizotonal noted  Skin: Skin is warm and dry. No abrasion and no rash noted.  Psychiatric: He has a normal mood and affect. His speech is normal and behavior is normal.    ED Course  Procedures (including critical care time) Labs Reviewed  CBC WITH DIFFERENTIAL  COMPREHENSIVE METABOLIC PANEL   No results found. No diagnosis found.  MDM  Patient given Antivert and feels better. Patient with likely peripheral vertigo. Neurological exam repeated at time of discharge remained stable.  Leota Jacobsen, MD 07/11/13 [phone]

## 2013-07-11 NOTE — ED Notes (Signed)
Resident at bedside.  

## 2013-10-10 ENCOUNTER — Emergency Department (HOSPITAL_COMMUNITY): Payer: Medicare Other

## 2013-10-10 ENCOUNTER — Observation Stay (HOSPITAL_COMMUNITY)
Admission: EM | Admit: 2013-10-10 | Discharge: 2013-10-11 | Disposition: A | Discharge status: Home / Self Care | Payer: Medicare Other | Attending: Internal Medicine | Admitting: Internal Medicine

## 2013-10-10 ENCOUNTER — Encounter (HOSPITAL_COMMUNITY): Payer: Self-pay | Admitting: Emergency Medicine

## 2013-10-10 DIAGNOSIS — Z7982 Long term (current) use of aspirin: Secondary | ICD-10-CM | POA: Insufficient documentation

## 2013-10-10 DIAGNOSIS — I639 Cerebral infarction, unspecified: Secondary | ICD-10-CM

## 2013-10-10 DIAGNOSIS — K7689 Other specified diseases of liver: Secondary | ICD-10-CM | POA: Diagnosis not present

## 2013-10-10 DIAGNOSIS — Z880 Allergy status to penicillin: Secondary | ICD-10-CM | POA: Diagnosis not present

## 2013-10-10 DIAGNOSIS — R110 Nausea: Secondary | ICD-10-CM | POA: Diagnosis not present

## 2013-10-10 DIAGNOSIS — I252 Old myocardial infarction: Secondary | ICD-10-CM | POA: Diagnosis not present

## 2013-10-10 DIAGNOSIS — R0602 Shortness of breath: Secondary | ICD-10-CM | POA: Insufficient documentation

## 2013-10-10 DIAGNOSIS — M109 Gout, unspecified: Secondary | ICD-10-CM | POA: Diagnosis not present

## 2013-10-10 DIAGNOSIS — E785 Hyperlipidemia, unspecified: Secondary | ICD-10-CM | POA: Insufficient documentation

## 2013-10-10 DIAGNOSIS — J441 Chronic obstructive pulmonary disease with (acute) exacerbation: Secondary | ICD-10-CM | POA: Diagnosis not present

## 2013-10-10 DIAGNOSIS — R0789 Other chest pain: Secondary | ICD-10-CM | POA: Diagnosis not present

## 2013-10-10 DIAGNOSIS — I10 Essential (primary) hypertension: Secondary | ICD-10-CM | POA: Diagnosis not present

## 2013-10-10 DIAGNOSIS — K219 Gastro-esophageal reflux disease without esophagitis: Secondary | ICD-10-CM | POA: Diagnosis not present

## 2013-10-10 DIAGNOSIS — G8929 Other chronic pain: Secondary | ICD-10-CM | POA: Insufficient documentation

## 2013-10-10 DIAGNOSIS — I2510 Atherosclerotic heart disease of native coronary artery without angina pectoris: Secondary | ICD-10-CM

## 2013-10-10 DIAGNOSIS — Z8673 Personal history of transient ischemic attack (TIA), and cerebral infarction without residual deficits: Secondary | ICD-10-CM

## 2013-10-10 DIAGNOSIS — K7460 Unspecified cirrhosis of liver: Secondary | ICD-10-CM

## 2013-10-10 DIAGNOSIS — Z860100 Personal history of colon polyps, unspecified: Secondary | ICD-10-CM

## 2013-10-10 DIAGNOSIS — I6350 Cerebral infarction due to unspecified occlusion or stenosis of unspecified cerebral artery: Secondary | ICD-10-CM | POA: Insufficient documentation

## 2013-10-10 DIAGNOSIS — Z79899 Other long term (current) drug therapy: Secondary | ICD-10-CM | POA: Insufficient documentation

## 2013-10-10 DIAGNOSIS — R079 Chest pain, unspecified: Secondary | ICD-10-CM

## 2013-10-10 DIAGNOSIS — F17200 Nicotine dependence, unspecified, uncomplicated: Secondary | ICD-10-CM | POA: Insufficient documentation

## 2013-10-10 DIAGNOSIS — K648 Other hemorrhoids: Secondary | ICD-10-CM

## 2013-10-10 LAB — CBC
Hemoglobin: 15.2 g/dL (ref 13.0–17.0)
MCH: 29.7 pg (ref 26.0–34.0)
MCHC: 34.7 g/dL (ref 30.0–36.0)
RDW: 12.4 % (ref 11.5–15.5)

## 2013-10-10 LAB — BASIC METABOLIC PANEL
BUN: 16 mg/dL (ref 6–23)
Calcium: 9.5 mg/dL (ref 8.4–10.5)
GFR calc Af Amer: >90 mL/min (ref >90)
GFR calc non Af Amer: >90 mL/min (ref >90)
Glucose, Bld: 126 mg/dL — ABNORMAL HIGH (ref 70–99)
Sodium: 137 mEq/L (ref 135–145)

## 2013-10-10 LAB — POCT I-STAT TROPONIN I

## 2013-10-10 MED ORDER — ASPIRIN 81 MG PO CHEW
324.0000 mg | CHEWABLE_TABLET | Freq: Once | ORAL | Status: AC
  Administered 2013-10-10: 324 mg via ORAL
  Filled 2013-10-10: qty 4

## 2013-10-10 MED ORDER — NITROGLYCERIN 0.4 MG SL SUBL
0.4000 mg | SUBLINGUAL_TABLET | SUBLINGUAL | Status: DC | PRN
  Administered 2013-10-10: 0.4 mg via SUBLINGUAL

## 2013-10-10 NOTE — ED Notes (Signed)
Report to [employer] on [place].

## 2013-10-10 NOTE — ED Notes (Signed)
Pt c/o left arm pain x 4 days; pt sts some pain in chest that is now resolved; pt sts pain in upper back at present with some SOB

## 2013-10-10 NOTE — ED Provider Notes (Signed)
TIME SEEN: 8:15 PM  CHIEF COMPLAINT: Chest pain, left arm pain, shortness of breath  HPI: Patient is a 52 y.o. male with a history of tobacco use, hypertension, hyperlipidemia, COPD, prior MI who presents the emergency department with left-sided chest pain that he describes as a pressure, sharp pain that radiates into his left arm and his left back. He has had intermittent episodes for the past 4 days. It is associated with shortness of breath is worse with exertion and nausea. No lightheadedness or diaphoresis. He states this pain feels like his prior MI. He is still having mild chest pain currently. Denies any fever but has had a new dry cough. No lower extremity swelling or pain.  No prior history of PE or DVT. No recent hospitalization, long flight, trauma, surgery, fracture or other prolonged immobilization.  PCP is Harris with [employer] is [place]  ROS: See HPI Constitutional: no fever  Eyes: no drainage  ENT: no runny nose   Cardiovascular:  chest pain  Resp:  SOB  GI: no vomiting GU: no dysuria Integumentary: no rash  Allergy: no hives  Musculoskeletal: no leg swelling  Neurological: no slurred speech ROS otherwise negative  PAST MEDICAL HISTORY/PAST SURGICAL HISTORY:  Past Medical History  Diagnosis Date  . GERD (gastroesophageal reflux disease)   . Gout   . Adenomatous colon polyp 2009  . Lymphocytic colitis 2009  . COPD (chronic obstructive pulmonary disease)   . Hypertension   . NASH (nonalcoholic steatohepatitis)   . Hyperlipidemia   . TIA (transient ischemic attack)   . Asthma   . Alcohol abuse     abstinent since 1992  . Hemorrhoids   . Chronic back pain   . Personal history of colonic polyps 07/20/2002    Diminutive adenomas (3) 06/2002 diminutive adenoma (1) 2011     MEDICATIONS:  Prior to Admission medications   Medication Sig Start Date End Date Taking? Authorizing Provider  allopurinol (ZYLOPRIM) 100 MG tablet Take 100 mg by mouth daily.     Historical Provider, MD  aspirin 325 MG tablet Take 325 mg by mouth daily.    Historical Provider, MD  esomeprazole (NEXIUM) 40 MG capsule Take 40 mg by mouth 2 (two) times daily.    Historical Provider, MD  losartan (COZAAR) 100 MG tablet Take 100 mg by mouth daily.    Historical Provider, MD  meclizine (ANTIVERT) 50 MG tablet Take 0.5 tablets (25 mg total) by mouth 3 (three) times daily as needed. 07/11/13   Leota Jacobsen, MD  metoprolol (LOPRESSOR) 100 MG tablet Take 100 mg by mouth 2 (two) times daily.    Historical Provider, MD  Milk Thistle 500 MG CAPS Take 1,000 mg by mouth daily.     Historical Provider, MD  Multiple Vitamin (MULITIVITAMIN WITH MINERALS) TABS Take 1 tablet by mouth daily.    Historical Provider, MD  naproxen (NAPROSYN) 500 MG tablet Take 500 mg by mouth 2 (two) times daily with a meal. For pain/fever    Historical Provider, MD  nitroGLYCERIN (NITROSTAT) 0.4 MG SL tablet Place 0.4 mg under the tongue every 5 (five) minutes as needed. For chest pain    Historical Provider, MD  Omega-3 Fatty Acids (FISH OIL) 1000 MG CAPS Take 1 capsule by mouth daily.    Historical Provider, MD  traZODone (DESYREL) 50 MG tablet Take 50 mg by mouth at bedtime.    Historical Provider, MD    ALLERGIES:  Allergies  Allergen Reactions  . Penicillins Hives  and Rash    SOCIAL HISTORY:  History  Substance Use Topics  . Smoking status: Current Every Day Smoker -- 1.00 packs/day for 41 years    Types: Cigarettes  . Smokeless tobacco: Never Used  . Alcohol Use: No    FAMILY HISTORY: Family History  Problem Relation Age of Onset  . Liver disease Mother   . Liver cancer Mother   . Colon cancer Neg Hx   . Heart disease      multiple family members on maternal and paternal side of family  . Breast cancer Mother   . Diabetes Sister   . Diabetes Paternal Grandmother     EXAM: BP 162/93  Pulse 79  Temp(Src) 98.5 F (36.9 C) (Oral)  Resp 18  SpO2 96% CONSTITUTIONAL: Alert and  oriented and responds appropriately to questions. Well-appearing; well-nourished HEAD: Normocephalic EYES: Conjunctivae clear, PERRL ENT: normal nose; no rhinorrhea; moist mucous membranes; pharynx without lesions noted NECK: Supple, no meningismus, no LAD  CARD: RRR; S1 and S2 appreciated; no murmurs, no clicks, no rubs, no gallops; chest wall is nontender to palpation RESP: Normal chest excursion without splinting or tachypnea; breath sounds clear and equal bilaterally; no wheezes, no rhonchi, no rales,  ABD/GI: Normal bowel sounds; non-distended; soft, non-tender, no rebound, no guarding BACK:  The back appears normal and is non-tender to palpation, there is no CVA tenderness EXT: Normal ROM in all joints; non-tender to palpation; no edema; normal capillary refill; no cyanosis    SKIN: Normal color for age and race; warm NEURO: Moves all extremities equally PSYCH: The patient's mood and manner are appropriate. Grooming and personal hygiene are appropriate.  MEDICAL DECISION MAKING: Patient with multiple risk factors for ACS who presents to the emergency department with chest pain that is similar to his prior MI. He denies a history however of cardiac catheterization and has new stents. States his last stress test was over 5 years ago. He has not seen his cardiologist in approximately 10 years. EKG shows no ST changes. Troponin is negative. Chest x-ray clear. Have recommended admission for rule out.  Will discuss with hospitalist. Patient is hemodynamically stable.       [place], DO 10/10/13 2123

## 2013-10-10 NOTE — ED Notes (Signed)
Pain completely gone s/p NTG SL.   "That pressure in my back is completely gone now"

## 2013-10-11 ENCOUNTER — Observation Stay (HOSPITAL_COMMUNITY): Payer: Medicare Other

## 2013-10-11 ENCOUNTER — Encounter (HOSPITAL_COMMUNITY): Payer: Self-pay | Admitting: Internal Medicine

## 2013-10-11 ENCOUNTER — Encounter (HOSPITAL_COMMUNITY): Payer: Medicare Other

## 2013-10-11 DIAGNOSIS — I2510 Atherosclerotic heart disease of native coronary artery without angina pectoris: Secondary | ICD-10-CM

## 2013-10-11 DIAGNOSIS — R079 Chest pain, unspecified: Secondary | ICD-10-CM | POA: Diagnosis present

## 2013-10-11 DIAGNOSIS — K7460 Unspecified cirrhosis of liver: Secondary | ICD-10-CM

## 2013-10-11 DIAGNOSIS — I6350 Cerebral infarction due to unspecified occlusion or stenosis of unspecified cerebral artery: Secondary | ICD-10-CM

## 2013-10-11 DIAGNOSIS — I10 Essential (primary) hypertension: Secondary | ICD-10-CM

## 2013-10-11 DIAGNOSIS — Z8673 Personal history of transient ischemic attack (TIA), and cerebral infarction without residual deficits: Secondary | ICD-10-CM

## 2013-10-11 LAB — D-DIMER, QUANTITATIVE: D-Dimer, Quant: <0.27 ug{FEU}/mL (ref 0.00–0.48)

## 2013-10-11 LAB — TROPONIN I
Troponin I: <0.3 ng/mL (ref <0.30)
Troponin I: <0.3 ng/mL

## 2013-10-11 MED ORDER — ONDANSETRON HCL 4 MG/2ML IJ SOLN: 4.0000 mg | Freq: Four times a day (QID) | INTRAMUSCULAR | Status: DC | PRN

## 2013-10-11 MED ORDER — ADULT MULTIVITAMIN W/MINERALS CH
1.0000 | ORAL_TABLET | Freq: Every day | ORAL | Status: DC
Start: 2013-10-11 — End: 2013-10-11
  Administered 2013-10-11: 1 via ORAL
  Filled 2013-10-11: qty 1

## 2013-10-11 MED ORDER — REGADENOSON 0.4 MG/5ML IV SOLN
INTRAVENOUS | Status: AC
  Administered 2013-10-11: 0.4 mg via INTRAVENOUS
  Filled 2013-10-11: qty 5

## 2013-10-11 MED ORDER — TECHNETIUM TC 99M SESTAMIBI GENERIC - CARDIOLITE
30.0000 | Freq: Once | INTRAVENOUS | Status: AC | PRN
  Administered 2013-10-11: 30 via INTRAVENOUS

## 2013-10-11 MED ORDER — ALLOPURINOL 100 MG PO TABS
100.0000 mg | ORAL_TABLET | Freq: Every day | ORAL | Status: DC
  Administered 2013-10-11: 100 mg via ORAL
  Filled 2013-10-11: qty 1

## 2013-10-11 MED ORDER — METOPROLOL TARTRATE 100 MG PO TABS
100.0000 mg | ORAL_TABLET | Freq: Two times a day (BID) | ORAL | Status: DC
  Administered 2013-10-11: 100 mg via ORAL
  Filled 2013-10-11 (×2): qty 1

## 2013-10-11 MED ORDER — ISOSORBIDE MONONITRATE ER 30 MG PO TB24: 30.0000 mg | ORAL_TABLET | Freq: Every day | ORAL | Status: DC

## 2013-10-11 MED ORDER — LOSARTAN POTASSIUM 50 MG PO TABS
100.0000 mg | ORAL_TABLET | Freq: Every day | ORAL | Status: DC
  Administered 2013-10-11: 100 mg via ORAL
  Filled 2013-10-11 (×2): qty 2

## 2013-10-11 MED ORDER — FISH OIL 1200 MG PO CPDR: 1200.0000 mg | DELAYED_RELEASE_CAPSULE | Freq: Every day | ORAL | Status: DC

## 2013-10-11 MED ORDER — ISOSORBIDE MONONITRATE ER 30 MG PO TB24
30.0000 mg | ORAL_TABLET | Freq: Every day | ORAL | Status: DC
  Administered 2013-10-11: 30 mg via ORAL
  Filled 2013-10-11: qty 1

## 2013-10-11 MED ORDER — ASPIRIN EC 325 MG PO TBEC
325.0000 mg | DELAYED_RELEASE_TABLET | Freq: Every day | ORAL | Status: DC
  Administered 2013-10-11: 325 mg via ORAL
  Filled 2013-10-11: qty 1

## 2013-10-11 MED ORDER — TECHNETIUM TC 99M SESTAMIBI GENERIC - CARDIOLITE
10.0000 | Freq: Once | INTRAVENOUS | Status: AC | PRN
  Administered 2013-10-11: 10 via INTRAVENOUS

## 2013-10-11 MED ORDER — ACETAMINOPHEN 325 MG PO TABS: 650.0000 mg | ORAL_TABLET | ORAL | Status: DC | PRN

## 2013-10-11 MED ORDER — MILK THISTLE 500 MG PO CAPS: 1000.0000 mg | ORAL_CAPSULE | Freq: Every day | ORAL | Status: DC

## 2013-10-11 MED ORDER — HYDROCORTISONE 2.5 % RE CREA
1.0000 "application " | TOPICAL_CREAM | Freq: Every day | RECTAL | Status: DC | PRN
  Filled 2013-10-11: qty 28.35

## 2013-10-11 MED ORDER — ISOSORBIDE MONONITRATE 15 MG HALF TABLET: 15.0000 mg | ORAL_TABLET | Freq: Every day | ORAL | Status: DC

## 2013-10-11 MED ORDER — TRAZODONE HCL 50 MG PO TABS
50.0000 mg | ORAL_TABLET | Freq: Every day | ORAL | Status: DC
  Administered 2013-10-11: 50 mg via ORAL
  Filled 2013-10-11 (×2): qty 1

## 2013-10-11 MED ORDER — ENOXAPARIN SODIUM 40 MG/0.4ML ~~LOC~~ SOLN
40.0000 mg | SUBCUTANEOUS | Status: DC
  Filled 2013-10-11: qty 0.4

## 2013-10-11 MED ORDER — REGADENOSON 0.4 MG/5ML IV SOLN
0.4000 mg | Freq: Once | INTRAVENOUS | Status: AC
  Administered 2013-10-11: 0.4 mg via INTRAVENOUS

## 2013-10-11 MED ORDER — HYDRALAZINE HCL 20 MG/ML IJ SOLN
10.0000 mg | Freq: Four times a day (QID) | INTRAMUSCULAR | Status: DC | PRN
  Filled 2013-10-11: qty 1

## 2013-10-11 MED ORDER — METOPROLOL TARTRATE 100 MG PO TABS
100.0000 mg | ORAL_TABLET | Freq: Two times a day (BID) | ORAL | Status: DC
  Administered 2013-10-11: 100 mg via ORAL
  Filled 2013-10-11 (×3): qty 1

## 2013-10-11 MED ORDER — OMEGA-3-ACID ETHYL ESTERS 1 G PO CAPS
1.0000 g | ORAL_CAPSULE | Freq: Every day | ORAL | Status: DC
  Administered 2013-10-11: 1 g via ORAL
  Filled 2013-10-11: qty 1

## 2013-10-11 MED ORDER — PANTOPRAZOLE SODIUM 40 MG PO TBEC
40.0000 mg | DELAYED_RELEASE_TABLET | Freq: Every day | ORAL | Status: DC
  Administered 2013-10-11: 40 mg via ORAL
  Filled 2013-10-11: qty 1

## 2013-10-11 MED ORDER — HYDRALAZINE HCL 20 MG/ML IJ SOLN
5.0000 mg | Freq: Once | INTRAMUSCULAR | Status: AC
  Administered 2013-10-11: 5 mg via INTRAVENOUS
  Filled 2013-10-11: qty 1

## 2013-10-11 NOTE — Progress Notes (Signed)
Nutrition Brief Note  Patient identified on the Malnutrition Screening Tool (MST) Report for unsure of any weight loss. Per review of usual weights below, patient has not lost weight over the past 7 years.   Wt Readings from Last 15 Encounters:  10/10/13 225 lb 5 oz (102.2 kg)  05/26/12 222 lb 0.1 oz (100.7 kg)  05/26/12 222 lb 0.1 oz (100.7 kg)  04/01/12 220 lb (99.791 kg)  03/16/12 223 lb (101.152 kg)  03/25/10 218 lb (98.884 kg)  03/12/10 218 lb (98.884 kg)  03/01/10 217 lb (98.431 kg)  03/10/06 213 lb (96.616 kg)    Body mass index is 34.27 kg/(m^2). Patient meets criteria for obesity, class 1 based on current BMI.   Current diet order is NPO for possible stress test today. Labs and medications reviewed.   No nutrition interventions warranted at this time. If nutrition issues arise, please consult RD.   Molli Barrows, RD, LDN, [place] Pager [phone] After Hours Pager [phone]

## 2013-10-11 NOTE — Progress Notes (Signed)
PHARMACIST - PHYSICIAN ORDER COMMUNICATION  CONCERNING: P&T Medication Policy on Herbal Medications  DESCRIPTION:  This patient's order for:  Milk thistle  has been noted.  This product(s) is classified as an "herbal" or natural product. Due to a lack of definitive safety studies or FDA approval, nonstandard manufacturing practices, plus the potential risk of unknown drug-drug interactions while on inpatient medications, the Pharmacy and Therapeutics Committee does not permit the use of "herbal" or natural products of this type within [facility].   ACTION TAKEN: The pharmacy department is unable to verify this order at this time and your patient has been informed of this safety policy. Please reevaluate patient's clinical condition at discharge and address if the herbal or natural product(s) should be resumed at that time.

## 2013-10-11 NOTE — H&P (Signed)
[facility] History and Physical  RAHUL MALINAK VVO:[id number] DOB: [birth year] DOA: 10/10/2013  Referring physician: ER physician. PCP: Shirline Frees, MD   Chief Complaint: Chest pain.  HPI: [name] [name] is a 52 y.o. 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 Patient states his symptoms started 3 days ago with left arm discomfort and had eventually started having chest discomfort off and on for last 3 days. Patient's chest discomfort happens even at rest it is usually retrosternal and left anterior chest wall sometimes radiating to the left arm. Usually gets relief by no intervention. Has no exertional symptoms or denies any associated shortness of breath. Today the chest pain versus with nausea which prompted him to come to the ER. In the ER EKG was showing normal sinus rhythm with nonspecific T-wave changes. Troponins were negative and the chest pain got better with nitroglycerin sublingual and patient is chest pain-free at this time. Patient has been made for further management. On exam patient is not in acute distress. Patient denies any vomiting or abdominal pain or diarrhea.   Review of Systems: As presented in the history of presenting illness, rest negative.  Past Medical History  Diagnosis Date  . GERD (gastroesophageal reflux disease)   . Gout   . Adenomatous colon polyp 2009  . Lymphocytic colitis 2009  . COPD (chronic obstructive pulmonary disease)   . Hypertension   . NASH (nonalcoholic steatohepatitis)   . Hyperlipidemia   . TIA (transient ischemic attack)   . Asthma   . Alcohol abuse     abstinent since 1992  . Hemorrhoids   . Chronic back pain   . Personal history of colonic polyps 07/20/2002    Diminutive adenomas (3) 06/2002 diminutive adenoma (1) 2011    Past Surgical History  Procedure Laterality  Date  . Lumbar spine surgery      x 2  . Nasal sinus surgery    . 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 Colonoscopy  multiple  . Band hemorrhoidectomy  2003    at sigmoidoscopy  . Tee without cardioversion  05/28/2012    Procedure: TRANSESOPHAGEAL ECHOCARDIOGRAM (TEE);  Surgeon: Lelon Perla, MD;  Location[facility] ENDOSCOPY;  Service: Cardiovascular;  Laterality: N/A;   Social History:  reports that he has been smoking Cigarettes.  He has a 41 pack-year smoking history. He has never used smokeless tobacco. He reports that he does not drink alcohol or use illicit drugs. Where does patient live home. Can patient participate in ADLs? Yes.  Allergies  Allergen Reactions  . Penicillins Hives and Rash    Family History:  Family History  Problem Relation Age of Onset  . Liver disease Mother   . Liver cancer Mother   . Colon cancer Neg Hx   . Heart disease      multiple family members on maternal and paternal side of family  . Breast cancer Mother   . Diabetes Sister   . Diabetes Paternal Grandmother       Prior to Admission medications   Medication Sig Start Date End Date Taking? Authorizing Provider  allopurinol (ZYLOPRIM) 100 MG tablet Take 100 mg by mouth daily.   Yes Historical Provider, MD  aspirin 325 MG tablet Take 325 mg by mouth daily.   Yes Historical Provider, MD  esomeprazole (NEXIUM) 40 MG capsule Take  40 mg by mouth 2 (two) times daily.   Yes Historical Provider, MD  hydrocortisone (ANUSOL-HC) 2.5 % rectal cream Place 1 application rectally daily as needed for hemorrhoids.   Yes Historical Provider, MD  ibuprofen (ADVIL,MOTRIN) 200 MG tablet Take 800 mg by mouth daily as needed for pain.   Yes Historical Provider, MD  losartan (COZAAR) 100 MG tablet Take 100 mg by mouth daily.   Yes Historical Provider, MD  metoprolol (LOPRESSOR) 100 MG tablet Take 100 mg by mouth 2 (two) times daily.   Yes Historical Provider, MD  Milk Thistle 500 MG CAPS Take 1,000 mg by mouth  daily.    Yes Historical Provider, MD  Multiple Vitamin (MULITIVITAMIN WITH MINERALS) TABS Take 1 tablet by mouth daily.   Yes Historical Provider, MD  naproxen (NAPROSYN) 500 MG tablet Take 500 mg by mouth 2 (two) times daily with a meal. For pain/fever   Yes Historical Provider, MD  nitroGLYCERIN (NITROSTAT) 0.4 MG SL tablet Place 0.4 mg under the tongue every 5 (five) minutes as needed. For chest pain   Yes Historical Provider, MD  Omega-3 Fatty Acids (FISH OIL) 1200 MG CPDR Take 1,200 mg by mouth daily.   Yes Historical Provider, MD  traZODone (DESYREL) 50 MG tablet Take 50 mg by mouth at bedtime.   Yes Historical Provider, MD    Physical Exam: Filed Vitals:   10/10/13 1704 10/10/13 2214  BP: 162/93 156/105  Pulse: 79 62  Temp: 98.5 F (36.9 C) 98.1 F (36.7 C)  TempSrc: Oral Oral  Resp: 18   Height:  5' 8"  (1.727 m)  Weight:  102.2 kg (225 lb 5 oz)  SpO2: 96% 98%     General:  Well-developed well-nourished.  Eyes: Anicteric no pallor.  ENT: No discharge from the ears eyes nose mouth.  Neck: No mass felt.  Cardiovascular: S1-S2 heard.  Respiratory: No rhonchi or crepitations.  Abdomen: Soft nontender bowel sounds present.  Skin: No rash.  Musculoskeletal: No edema.  Psychiatric: Appears normal.  Neurologic: Alert her weight oriented to time place and person. Moves all extremities.  Labs on Admission:  Basic Metabolic Panel:  Recent Labs Lab 10/10/13 1709  NA 137  K 4.1  CL 101  CO2 27  GLUCOSE 126*  BUN 16  CREATININE 0.67  CALCIUM 9.5   Liver Function Tests: No results found for this basename: AST, ALT, ALKPHOS, BILITOT, PROT, ALBUMIN,  in the last 168 hours No results found for this basename: LIPASE, AMYLASE,  in the last 168 hours No results found for this basename: AMMONIA,  in the last 168 hours CBC:  Recent Labs Lab 10/10/13 1709  WBC 7.8  HGB 15.2  HCT 43.8  MCV 85.7  PLT 170   Cardiac Enzymes: No results found for this basename:  CKTOTAL, CKMB, CKMBINDEX, TROPONINI,  in the last 168 hours  BNP (last 3 results) No results found for this basename: PROBNP,  in the last 8760 hours CBG: No results found for this basename: GLUCAP,  in the last 168 hours  Radiological Exams on Admission: Dg Chest 2 View  10/10/2013   CLINICAL DATA:  Chest pain  EXAM: CHEST  2 VIEW  COMPARISON:  Chest radiograph 05/27/2012  FINDINGS: Stable cardiac and mediastinal contours. Mild central pulmonary vessel prominence. No consolidative pulmonary opacities. No pleural effusion or pneumothorax. Regional skeleton is unremarkable.  IMPRESSION: No acute cardiopulmonary process.  Mild central pulmonary vessel prominence.   Electronically Signed   By: Lovey Newcomer  M.D.   On: 10/10/2013 17:46    EKG: Independently reviewed. Normal sinus rhythm with RSR pattern in V1 and nonspecific ST-T changes.  Assessment/Plan Principal Problem:   Chest pain Active Problems:   Personal history of colonic polyps   Cirrhosis of liver   History of CVA (cerebrovascular accident)   1. Chest pain - has typical and atypical features. Cycle cardiac markers. Check 2-D echo and d-dimer. Aspirin. When necessary nitroglycerin. 2. Hypertension - continue present medications. 3. History of CVA and brain aneurysm status post stenting - on aspirin. 4. Cirrhosis of liver secondary to alcoholism - patient has not had alcohol for many years. Patient is on milk thistle.    Code Status: Full code.  Family Communication: Patient's wife at the bedside.  Disposition Plan: Admit for observation.    Shantanique Hodo N. [facility] Pager [phone].  If 7PM-7AM, please contact night-coverage [email or web address] Password [email or web address] 10/11/2013, 12:44 AM

## 2013-10-11 NOTE — Discharge Summary (Signed)
Physician Discharge Summary  Patient ID: [name] [name] MRN: [id number] DOB/AGE: [birth year] 52 y.o.  Admit date: 10/10/2013 Discharge date: 10/11/2013  Primary Care Physician:  Shirline Frees, MD  Discharge Diagnoses:    . Chest pain likely due to accelerated hypertension  Accelerated hypertension  GERD  . Cirrhosis of liver  Consults:  Cardiology consult for nuclear medicine stress test   Recommendations for Outpatient Follow-up:  1. patient's BP is somewhat uncontrolled, he started on Imdur 30 mg daily (with beta blocker and losartan). He may need further adjustment of his antihypertensive doses at the followup appointment.  Allergies:   Allergies  Allergen Reactions  . Penicillins Hives and Rash     Discharge Medications:   Medication List    STOP taking these medications       ibuprofen 200 MG tablet  Commonly known as:  ADVIL,MOTRIN      TAKE these medications       allopurinol 100 MG tablet  Commonly known as:  ZYLOPRIM  Take 100 mg by mouth daily.     aspirin 325 MG tablet  Take 325 mg by mouth daily.     esomeprazole 40 MG capsule  Commonly known as:  NEXIUM  Take 40 mg by mouth 2 (two) times daily.     Fish Oil 1200 MG Cpdr  Take 1,200 mg by mouth daily.     hydrocortisone 2.5 % rectal cream  Commonly known as:  ANUSOL-HC  Place 1 application rectally daily as needed for hemorrhoids.     isosorbide mononitrate 30 MG 24 hr tablet  Commonly known as:  IMDUR  Take 1 tablet (30 mg total) by mouth daily.     losartan 100 MG tablet  Commonly known as:  COZAAR  Take 100 mg by mouth daily.     metoprolol 100 MG tablet  Commonly known as:  LOPRESSOR  Take 100 mg by mouth 2 (two) times daily.     Milk Thistle 500 MG Caps  Take 1,000 mg by mouth daily.     multivitamin with minerals Tabs tablet  Take 1 tablet by mouth daily.     naproxen 500 MG tablet  Commonly known as:  NAPROSYN  Take 500 mg by mouth 2 (two) times daily with a  meal. For pain/fever     nitroGLYCERIN 0.4 MG SL tablet  Commonly known as:  NITROSTAT  Place 0.4 mg under the tongue every 5 (five) minutes as needed. For chest pain     traZODone 50 MG tablet  Commonly known as:  DESYREL  Take 50 mg by mouth at bedtime.         Brief H and P: For complete details please refer to admission H and P, but in brief [name] [name] is a 52 y.o. 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 Patient states his symptoms started 3 days ago with left arm discomfort and had eventually started having chest discomfort off and on for last 3 days. Patient's chest discomfort happened even at rest and was usually retrosternal and left anterior chest wall sometimes radiating to the left arm, resolved spontaneously, had no exertional symptoms or denies any associated shortness of breath. In the ER, EKG was showing normal sinus rhythm with nonspecific T-wave changes. Troponins were negative and the chest pain got better with nitroglycerin sublingual and patient is chest  pain-free at this time.   Hospital Course:   Chest pain: Resolved likely due to accelerated hypertension. Patient was admitted for further workup, troponins were negative. In the ER, EKG showed normal sinus rhythm with nonspecific T-wave changes. Given the chest pain at rest, history of nicotine abuse, hypertension, history of CVA last year, cardiology was consulted for nuclear medicine stress test. Patient underwent lexiscan scan Myoview which showed no ischemia or infarction, EF of 57%, normal resting and stress imaging. D-dimer was less than 0.27  Accelerated hypertension: Patient was noted to have elevated BP readings even during the hospitalization, Imdur 31m daily was also added to his antihypertensive regimen of beta blocker and losartan. The patient was  recommended to followup with his PCP as he may need further adjustment of his antihypertensive doses.  Day of Discharge BP 171/95  Pulse 73  Temp(Src) 97.9 F (36.6 C) (Oral)  Resp 20  Ht 5' 8"  (1.727 m)  Wt 102.2 kg (225 lb 5 oz)  BMI 34.27 kg/m2  SpO2 98%  Physical Exam: General: Alert and awake oriented x3 not in any acute distress. HEENT: anicteric sclera, pupils reactive to light and accommodation CVS: S1-S2 clear no murmur rubs or gallops Chest: clear to auscultation bilaterally, no wheezing rales or rhonchi Abdomen: soft nontender, nondistended, normal bowel sounds, no organomegaly Extremities: no cyanosis, clubbing or edema noted bilaterally Neuro: Cranial nerves II-XII intact, no focal neurological deficits   The results of significant diagnostics from this hospitalization (including imaging, microbiology, ancillary and laboratory) are listed below for reference.    LAB RESULTS: Basic Metabolic Panel:  Recent Labs Lab 10/10/13 1709  NA 137  K 4.1  CL 101  CO2 27  GLUCOSE 126*  BUN 16  CREATININE 0.67  CALCIUM 9.5   Liver Function Tests: No results found for this basename: AST, ALT, ALKPHOS, BILITOT, PROT, ALBUMIN,  in the last 168 hours No results found for this basename: LIPASE, AMYLASE,  in the last 168 hours No results found for this basename: AMMONIA,  in the last 168 hours CBC:  Recent Labs Lab 10/10/13 1709  WBC 7.8  HGB 15.2  HCT 43.8  MCV 85.7  PLT 170   Cardiac Enzymes:  Recent Labs Lab 10/11/13 0615 10/11/13 1323  TROPONINI <0.30 <0.30   BNP: No components found with this basename: POCBNP,  CBG: No results found for this basename: GLUCAP,  in the last 168 hours  Significant Diagnostic Studies:  Dg Chest 2 View  10/10/2013   CLINICAL DATA:  Chest pain  EXAM: CHEST  2 VIEW  COMPARISON:  Chest radiograph 05/27/2012  FINDINGS: Stable cardiac and mediastinal contours. Mild central pulmonary vessel prominence. No consolidative  pulmonary opacities. No pleural effusion or pneumothorax. Regional skeleton is unremarkable.  IMPRESSION: No acute cardiopulmonary process.  Mild central pulmonary vessel prominence.   Electronically Signed   By: DLovey NewcomerM.D.   On: 10/10/2013 17:46   Nm Myocar Multi W/spect W/wall Motion / Ef  10/11/2013   CLINICAL DATA:  Chest pain  EXAM: Lexiscan Myovue  TECHNIQUE: The patient received IV Lexiscan .420mover 15 seconds. 33.0 mCi of Technetium [id number] injected at 30 seconds. Quantitative SPECT images were obtained in the vertical, horizontal and short axis planes after a 45 minute delay. Rest images were obtained with similar planes and delay using 10.2 mCi of Technetium [id number].  FINDINGS: ECG:  NSR normal ECG  Symptoms:  No chest pain or dyspnea no ECG changes  RAW Data:  Bowel uptake  QPS:  SDS 6 scattered at basal septum  Quantitative Gated SPECT EF:  Normal 57% no RWMA;s  Perfusion Images:  Normal resting and stress images  IMPRESSION: Normal lexiscan myovue No ischemia or infarction.  EF 57%  Jenkins Rouge   Electronically Signed   By: Jenkins Rouge M.D.   On: 10/11/2013 14:01       Disposition and Follow-up:     Discharge Orders   Future Orders Complete By Expires   Diet - low sodium heart healthy  As directed    Discharge instructions  As directed    Comments:     Please discuss with your primary doctor about your BP, you are started on new medication called IMDUR for BP. You may need further adjustments of your BP medications,   Increase activity slowly  As directed        DISPOSITION: Home  DIET:Heart healthy diet  ACTIVITY: As tolerated    DISCHARGE FOLLOW-UP Follow-up Information   Follow up with Shirline Frees, MD. Schedule an appointment as soon as possible for a visit in 10 days. (for hospital follow-up)    Specialty:  Family Medicine   Contact information:   [address] [place] [address] [phone]       Time spent on  Discharge: 36 mins  Signed:   Shemiah Rosch M.D. [facility] 10/11/2013, 2:21 PM Pager: [phone]

## 2013-10-11 NOTE — Progress Notes (Signed)
Utilization review completed.  

## 2013-10-11 NOTE — Progress Notes (Signed)
Lexiscan Cardiolite performed.

## 2013-10-18 DIAGNOSIS — I10 Essential (primary) hypertension: Secondary | ICD-10-CM | POA: Diagnosis not present

## 2013-11-03 ENCOUNTER — Other Ambulatory Visit: Payer: Self-pay

## 2013-12-29 DIAGNOSIS — Z8709 Personal history of other diseases of the respiratory system: Secondary | ICD-10-CM

## 2013-12-29 HISTORY — DX: Personal history of other diseases of the respiratory system: Z87.09

## 2014-01-25 DIAGNOSIS — K7460 Unspecified cirrhosis of liver: Secondary | ICD-10-CM | POA: Diagnosis not present

## 2014-01-25 DIAGNOSIS — E785 Hyperlipidemia, unspecified: Secondary | ICD-10-CM | POA: Diagnosis not present

## 2014-01-25 DIAGNOSIS — M549 Dorsalgia, unspecified: Secondary | ICD-10-CM | POA: Diagnosis not present

## 2014-01-25 DIAGNOSIS — G479 Sleep disorder, unspecified: Secondary | ICD-10-CM | POA: Diagnosis not present

## 2014-01-25 DIAGNOSIS — I10 Essential (primary) hypertension: Secondary | ICD-10-CM | POA: Diagnosis not present

## 2014-01-26 ENCOUNTER — Other Ambulatory Visit: Payer: Self-pay | Admitting: Family Medicine

## 2014-01-26 DIAGNOSIS — R109 Unspecified abdominal pain: Secondary | ICD-10-CM

## 2014-01-30 ENCOUNTER — Ambulatory Visit
Admission: RE | Admit: 2014-01-30 | Discharge: 2014-01-30 | Disposition: A | Discharge status: Home / Self Care | Payer: Medicare Other | Source: Ambulatory Visit | Attending: Family Medicine | Admitting: Family Medicine

## 2014-01-30 DIAGNOSIS — N281 Cyst of kidney, acquired: Secondary | ICD-10-CM | POA: Diagnosis not present

## 2014-01-30 DIAGNOSIS — K7689 Other specified diseases of liver: Secondary | ICD-10-CM | POA: Diagnosis not present

## 2014-01-30 DIAGNOSIS — R109 Unspecified abdominal pain: Secondary | ICD-10-CM

## 2014-04-27 DIAGNOSIS — I10 Essential (primary) hypertension: Secondary | ICD-10-CM | POA: Diagnosis not present

## 2014-04-27 DIAGNOSIS — M549 Dorsalgia, unspecified: Secondary | ICD-10-CM | POA: Diagnosis not present

## 2014-07-12 ENCOUNTER — Other Ambulatory Visit: Payer: Self-pay | Admitting: Family Medicine

## 2014-07-12 DIAGNOSIS — N281 Cyst of kidney, acquired: Secondary | ICD-10-CM

## 2014-07-17 ENCOUNTER — Ambulatory Visit
Admission: RE | Admit: 2014-07-17 | Discharge: 2014-07-17 | Disposition: A | Discharge status: Home / Self Care | Payer: Medicare Other | Source: Ambulatory Visit | Attending: Family Medicine | Admitting: Family Medicine

## 2014-07-17 DIAGNOSIS — N281 Cyst of kidney, acquired: Secondary | ICD-10-CM

## 2014-10-23 ENCOUNTER — Emergency Department (HOSPITAL_COMMUNITY)
Admission: EM | Admit: 2014-10-23 | Discharge: 2014-10-23 | Disposition: A | Discharge status: Home / Self Care | Payer: Medicare Other | Attending: Emergency Medicine | Admitting: Emergency Medicine

## 2014-10-23 ENCOUNTER — Emergency Department (HOSPITAL_COMMUNITY): Payer: Medicare Other

## 2014-10-23 ENCOUNTER — Encounter (HOSPITAL_COMMUNITY): Payer: Self-pay | Admitting: Emergency Medicine

## 2014-10-23 DIAGNOSIS — K219 Gastro-esophageal reflux disease without esophagitis: Secondary | ICD-10-CM | POA: Insufficient documentation

## 2014-10-23 DIAGNOSIS — R059 Cough, unspecified: Secondary | ICD-10-CM

## 2014-10-23 DIAGNOSIS — Z880 Allergy status to penicillin: Secondary | ICD-10-CM | POA: Diagnosis not present

## 2014-10-23 DIAGNOSIS — Z8673 Personal history of transient ischemic attack (TIA), and cerebral infarction without residual deficits: Secondary | ICD-10-CM | POA: Insufficient documentation

## 2014-10-23 DIAGNOSIS — Z79899 Other long term (current) drug therapy: Secondary | ICD-10-CM | POA: Insufficient documentation

## 2014-10-23 DIAGNOSIS — Z791 Long term (current) use of non-steroidal anti-inflammatories (NSAID): Secondary | ICD-10-CM | POA: Insufficient documentation

## 2014-10-23 DIAGNOSIS — I10 Essential (primary) hypertension: Secondary | ICD-10-CM | POA: Insufficient documentation

## 2014-10-23 DIAGNOSIS — J40 Bronchitis, not specified as acute or chronic: Secondary | ICD-10-CM

## 2014-10-23 DIAGNOSIS — Z720 Tobacco use: Secondary | ICD-10-CM | POA: Insufficient documentation

## 2014-10-23 DIAGNOSIS — R0602 Shortness of breath: Secondary | ICD-10-CM | POA: Diagnosis not present

## 2014-10-23 DIAGNOSIS — Z86010 Personal history of colonic polyps: Secondary | ICD-10-CM | POA: Diagnosis not present

## 2014-10-23 DIAGNOSIS — M109 Gout, unspecified: Secondary | ICD-10-CM | POA: Insufficient documentation

## 2014-10-23 DIAGNOSIS — Z7952 Long term (current) use of systemic steroids: Secondary | ICD-10-CM | POA: Insufficient documentation

## 2014-10-23 DIAGNOSIS — Z8639 Personal history of other endocrine, nutritional and metabolic disease: Secondary | ICD-10-CM | POA: Diagnosis not present

## 2014-10-23 DIAGNOSIS — Z7982 Long term (current) use of aspirin: Secondary | ICD-10-CM | POA: Diagnosis not present

## 2014-10-23 DIAGNOSIS — J441 Chronic obstructive pulmonary disease with (acute) exacerbation: Secondary | ICD-10-CM | POA: Insufficient documentation

## 2014-10-23 DIAGNOSIS — G8929 Other chronic pain: Secondary | ICD-10-CM | POA: Diagnosis not present

## 2014-10-23 DIAGNOSIS — R079 Chest pain, unspecified: Secondary | ICD-10-CM | POA: Diagnosis not present

## 2014-10-23 DIAGNOSIS — J42 Unspecified chronic bronchitis: Secondary | ICD-10-CM | POA: Diagnosis not present

## 2014-10-23 DIAGNOSIS — R05 Cough: Secondary | ICD-10-CM | POA: Diagnosis not present

## 2014-10-23 MED ORDER — ALBUTEROL SULFATE (2.5 MG/3ML) 0.083% IN NEBU
5.0000 mg | INHALATION_SOLUTION | Freq: Once | RESPIRATORY_TRACT | Status: AC
  Administered 2014-10-23: 5 mg via RESPIRATORY_TRACT
  Filled 2014-10-23: qty 6

## 2014-10-23 MED ORDER — ALBUTEROL SULFATE HFA 108 (90 BASE) MCG/ACT IN AERS
2.0000 | INHALATION_SPRAY | Freq: Once | RESPIRATORY_TRACT | Status: AC
  Administered 2014-10-23: 2 via RESPIRATORY_TRACT
  Filled 2014-10-23: qty 6.7

## 2014-10-23 MED ORDER — PREDNISONE 20 MG PO TABS
60.0000 mg | ORAL_TABLET | Freq: Once | ORAL | Status: AC
  Administered 2014-10-23: 60 mg via ORAL
  Filled 2014-10-23: qty 3

## 2014-10-23 MED ORDER — PREDNISONE (PAK) 10 MG PO TABS: ORAL_TABLET | Freq: Every day | ORAL | Status: DC

## 2014-10-23 MED ORDER — IPRATROPIUM BROMIDE 0.02 % IN SOLN
0.5000 mg | Freq: Once | RESPIRATORY_TRACT | Status: AC
  Administered 2014-10-23: 0.5 mg via RESPIRATORY_TRACT
  Filled 2014-10-23: qty 2.5

## 2014-10-23 NOTE — ED Notes (Signed)
Pt states he feels better.

## 2014-10-23 NOTE — ED Notes (Signed)
Pt reports since Thursday, having a cough and sneezing; reports hx bronchitis around this time of year

## 2014-10-23 NOTE — Discharge Instructions (Signed)
Read the information below.  Use the prescribed medication as directed.  Please discuss all new medications with your pharmacist.  You may return to the Emergency Department at any time for worsening condition or any new symptoms that concern you.   If you develop worsening shortness of breath, uncontrolled wheezing, severe chest pain, or fevers despite using tylenol and/or ibuprofen, return for a recheck.     °

## 2014-10-23 NOTE — ED Provider Notes (Signed)
Medical screening examination/treatment/procedure(s) were performed by non-physician practitioner and as supervising physician I was immediately available for consultation/collaboration.   EKG Interpretation None       Orlie Dakin, MD 10/23/14 1726

## 2014-10-23 NOTE — ED Provider Notes (Signed)
CSN: [id number]     Arrival date & time 10/23/14  0932 History  This chart was scribed for non-physician practitioner working with Orlie Dakin, MD by Ludger Nutting, ED Scribe. This patient was seen in room TR05C/TR05C and the patient's care was started at 12:17 PM.    Chief Complaint  Patient presents with  . Cough   The history is provided by the patient. No language interpreter was used.    HPI Comments: [name] [name] is a 53 y.o. male with past medical history of COPD who presents to the Emergency Department complaining of 4 days of gradual onset, intermittent, gradually worsened cough with associated SOB. He reports have one episode of gray colored sputum production. He reports having similar symptoms in the past when diagnosed with bronchitis. He denies fever, myalgias, sore throat, leg swelling, hemoptysis. He is a current 1 ppd smoker.  Has run out of albuterol HFA at home.   Past Medical History  Diagnosis Date  . GERD (gastroesophageal reflux disease)   . Gout   . Adenomatous colon polyp 2009  . Lymphocytic colitis 2009  . COPD (chronic obstructive pulmonary disease)   . Hypertension   . NASH (nonalcoholic steatohepatitis)   . Hyperlipidemia   . TIA (transient ischemic attack)   . Asthma   . Alcohol abuse     abstinent since 1992  . Hemorrhoids   . Chronic back pain   . Personal history of colonic polyps 07/20/2002    Diminutive adenomas (3) 06/2002 diminutive adenoma (1) 2011    Past Surgical History  Procedure Laterality Date  . Lumbar spine surgery      x 2  . Nasal sinus surgery    . 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 Colonoscopy  multiple  . Band hemorrhoidectomy  2003    at sigmoidoscopy  . Tee without cardioversion  05/28/2012    Procedure: TRANSESOPHAGEAL ECHOCARDIOGRAM (TEE);  Surgeon: Lelon Perla, MD;  Location: [facility] ENDOSCOPY;  Service: Cardiovascular;  Laterality: N/A;   Family History  Problem Relation Age of Onset  . Liver disease  Mother   . Liver cancer Mother   . Colon cancer Neg Hx   . Heart disease      multiple family members on maternal and paternal side of family  . Breast cancer Mother   . Diabetes Sister   . Diabetes Paternal Grandmother    History  Substance Use Topics  . Smoking status: Current Every Day Smoker -- 1.00 packs/day for 41 years    Types: Cigarettes  . Smokeless tobacco: Never Used  . Alcohol Use: No    Review of Systems  Constitutional: Negative for fever.  HENT: Positive for rhinorrhea. Negative for congestion and sore throat.   Respiratory: Positive for cough and shortness of breath.   Cardiovascular: Negative for chest pain.  Musculoskeletal: Negative for myalgias.      Allergies  Penicillins  Home Medications   Prior to Admission medications   Medication Sig Start Date End Date Taking? Authorizing Provider  allopurinol (ZYLOPRIM) 100 MG tablet Take 100 mg by mouth daily.    Historical Provider, MD  aspirin 325 MG tablet Take 325 mg by mouth daily.    Historical Provider, MD  esomeprazole (NEXIUM) 40 MG capsule Take 40 mg by mouth 2 (two) times daily.    Historical Provider, MD  hydrocortisone (ANUSOL-HC) 2.5 % rectal cream Place 1 application rectally daily as needed for hemorrhoids.    Historical Provider,  MD  isosorbide mononitrate (IMDUR) 30 MG 24 hr tablet Take 1 tablet (30 mg total) by mouth daily. 10/11/13   Ripudeep Krystal Eaton, MD  losartan (COZAAR) 100 MG tablet Take 100 mg by mouth daily.    Historical Provider, MD  metoprolol (LOPRESSOR) 100 MG tablet Take 100 mg by mouth 2 (two) times daily.    Historical Provider, MD  Milk Thistle 500 MG CAPS Take 1,000 mg by mouth daily.     Historical Provider, MD  Multiple Vitamin (MULITIVITAMIN WITH MINERALS) TABS Take 1 tablet by mouth daily.    Historical Provider, MD  naproxen (NAPROSYN) 500 MG tablet Take 500 mg by mouth 2 (two) times daily with a meal. For pain/fever    Historical Provider, MD  nitroGLYCERIN (NITROSTAT)  0.4 MG SL tablet Place 0.4 mg under the tongue every 5 (five) minutes as needed. For chest pain    Historical Provider, MD  Omega-3 Fatty Acids (FISH OIL) 1200 MG CPDR Take 1,200 mg by mouth daily.    Historical Provider, MD  traZODone (DESYREL) 50 MG tablet Take 50 mg by mouth at bedtime.    Historical Provider, MD   BP 144/97  Pulse 76  Temp(Src) 97.8 F (36.6 C) (Oral)  Resp 18  Ht 5' 8"  (1.727 m)  Wt 220 lb (99.791 kg)  BMI 33.46 kg/m2  SpO2 96% Physical Exam  Nursing note and vitals reviewed. Constitutional: He appears well-developed and well-nourished. No distress.  HENT:  Head: Normocephalic and atraumatic.  Mouth/Throat: Oropharynx is clear and moist. No oropharyngeal exudate.  Neck: Normal range of motion. Neck supple.  Cardiovascular: Normal rate.   Pulmonary/Chest: Effort normal. No respiratory distress. He has wheezes (diffuse throughout). He has no rales.  Decreased air movement and cough with deep inspiration.   Musculoskeletal: He exhibits no edema.  Lymphadenopathy:    He has no cervical adenopathy.  Neurological: He is alert.  Skin: He is not diaphoretic.    ED Course  Procedures (including critical care time)  DIAGNOSTIC STUDIES: Oxygen Saturation is 96% on RA, adequate by my interpretation.    COORDINATION OF CARE: 12:19 PM Will administer breathing treatment. Discussed treatment plan with pt at bedside and pt agreed to plan.  1:47 PM Upon recheck, patient is moving air well in all fields with minor scattered wheezes throughout. Patient states he feels much better. Will discharge home with albuterol inhaler.    Labs Review Labs Reviewed - No data to display  Imaging Review Dg Chest 2 View  10/23/2014   CLINICAL DATA:  Cough, shortness of breath, and chest pain since Thursday, personal history GERD, COPD, hypertension, NASH  EXAM: CHEST  2 VIEW  COMPARISON:  None.  FINDINGS: Mild enlargement of cardiac silhouette.  Mediastinal contours and pulmonary  vascularity normal.  Chronic bronchitic changes.  Slight chronic interstitial prominence, stable.  No acute infiltrate, pleural effusion or pneumothorax.  Bones unremarkable.  IMPRESSION: Mild enlargement of cardiac silhouette.  Mild chronic bronchitic interstitial changes without acute abnormalities.   Electronically Signed   By: Lavonia Dana M.D.   On: 10/23/2014 11:03     EKG Interpretation None      MDM   Final diagnoses:  Bronchitis   Afebrile, nontoxic patient with cough, wheezing, SOB, mild chest tightness hx COPD and smoking,  Great improvement with prednisone, albuterol, atrovent.  After treatments moving air well on exam, great improvement in wheezing.  Pt declined further neb treatments.   D/C home with albuterol hfa, prednisone, PCP follow  up.  Discussed result, findings, treatment, and follow up  with patient.  Pt given return precautions.  Pt verbalizes understanding and agrees with plan.       I personally performed the services described in this documentation, which was scribed in my presence. The recorded information has been reviewed and is accurate.   [name], PA-C 10/23/14 1651

## 2014-12-06 DIAGNOSIS — I10 Essential (primary) hypertension: Secondary | ICD-10-CM | POA: Diagnosis not present

## 2014-12-06 DIAGNOSIS — Z23 Encounter for immunization: Secondary | ICD-10-CM | POA: Diagnosis not present

## 2014-12-06 DIAGNOSIS — K7460 Unspecified cirrhosis of liver: Secondary | ICD-10-CM | POA: Diagnosis not present

## 2014-12-06 DIAGNOSIS — M109 Gout, unspecified: Secondary | ICD-10-CM | POA: Diagnosis not present

## 2014-12-06 DIAGNOSIS — M549 Dorsalgia, unspecified: Secondary | ICD-10-CM | POA: Diagnosis not present

## 2014-12-06 DIAGNOSIS — I2510 Atherosclerotic heart disease of native coronary artery without angina pectoris: Secondary | ICD-10-CM | POA: Diagnosis not present

## 2014-12-06 DIAGNOSIS — G459 Transient cerebral ischemic attack, unspecified: Secondary | ICD-10-CM | POA: Diagnosis not present

## 2014-12-06 DIAGNOSIS — E785 Hyperlipidemia, unspecified: Secondary | ICD-10-CM | POA: Diagnosis not present

## 2014-12-06 DIAGNOSIS — G479 Sleep disorder, unspecified: Secondary | ICD-10-CM | POA: Diagnosis not present

## 2015-03-19 ENCOUNTER — Encounter: Payer: Self-pay | Admitting: Internal Medicine

## 2015-03-26 ENCOUNTER — Emergency Department (HOSPITAL_COMMUNITY): Payer: Medicare Other

## 2015-03-26 ENCOUNTER — Encounter (HOSPITAL_COMMUNITY): Payer: Self-pay | Admitting: Radiology

## 2015-03-26 ENCOUNTER — Emergency Department (HOSPITAL_COMMUNITY)
Admission: EM | Admit: 2015-03-26 | Discharge: 2015-03-27 | Disposition: A | Discharge status: Home / Self Care | Payer: Medicare Other | Attending: Emergency Medicine | Admitting: Emergency Medicine

## 2015-03-26 DIAGNOSIS — Z79899 Other long term (current) drug therapy: Secondary | ICD-10-CM | POA: Insufficient documentation

## 2015-03-26 DIAGNOSIS — I10 Essential (primary) hypertension: Secondary | ICD-10-CM | POA: Insufficient documentation

## 2015-03-26 DIAGNOSIS — Z7982 Long term (current) use of aspirin: Secondary | ICD-10-CM | POA: Insufficient documentation

## 2015-03-26 DIAGNOSIS — J449 Chronic obstructive pulmonary disease, unspecified: Secondary | ICD-10-CM | POA: Diagnosis not present

## 2015-03-26 DIAGNOSIS — Z720 Tobacco use: Secondary | ICD-10-CM | POA: Insufficient documentation

## 2015-03-26 DIAGNOSIS — R935 Abnormal findings on diagnostic imaging of other abdominal regions, including retroperitoneum: Secondary | ICD-10-CM | POA: Insufficient documentation

## 2015-03-26 DIAGNOSIS — K649 Unspecified hemorrhoids: Secondary | ICD-10-CM | POA: Insufficient documentation

## 2015-03-26 DIAGNOSIS — I714 Abdominal aortic aneurysm, without rupture: Secondary | ICD-10-CM | POA: Diagnosis not present

## 2015-03-26 DIAGNOSIS — Z7952 Long term (current) use of systemic steroids: Secondary | ICD-10-CM | POA: Diagnosis not present

## 2015-03-26 DIAGNOSIS — Z880 Allergy status to penicillin: Secondary | ICD-10-CM | POA: Diagnosis not present

## 2015-03-26 DIAGNOSIS — Z86010 Personal history of colonic polyps: Secondary | ICD-10-CM | POA: Diagnosis not present

## 2015-03-26 DIAGNOSIS — R1010 Upper abdominal pain, unspecified: Secondary | ICD-10-CM | POA: Diagnosis not present

## 2015-03-26 DIAGNOSIS — Z8673 Personal history of transient ischemic attack (TIA), and cerebral infarction without residual deficits: Secondary | ICD-10-CM | POA: Diagnosis not present

## 2015-03-26 DIAGNOSIS — R1084 Generalized abdominal pain: Secondary | ICD-10-CM | POA: Diagnosis not present

## 2015-03-26 DIAGNOSIS — Z791 Long term (current) use of non-steroidal anti-inflammatories (NSAID): Secondary | ICD-10-CM | POA: Diagnosis not present

## 2015-03-26 DIAGNOSIS — K219 Gastro-esophageal reflux disease without esophagitis: Secondary | ICD-10-CM | POA: Diagnosis not present

## 2015-03-26 DIAGNOSIS — G8929 Other chronic pain: Secondary | ICD-10-CM | POA: Diagnosis not present

## 2015-03-26 DIAGNOSIS — R938 Abnormal findings on diagnostic imaging of other specified body structures: Secondary | ICD-10-CM | POA: Diagnosis not present

## 2015-03-26 DIAGNOSIS — R109 Unspecified abdominal pain: Secondary | ICD-10-CM | POA: Diagnosis present

## 2015-03-26 DIAGNOSIS — K625 Hemorrhage of anus and rectum: Secondary | ICD-10-CM | POA: Diagnosis not present

## 2015-03-26 DIAGNOSIS — I701 Atherosclerosis of renal artery: Secondary | ICD-10-CM | POA: Diagnosis not present

## 2015-03-26 DIAGNOSIS — M109 Gout, unspecified: Secondary | ICD-10-CM | POA: Diagnosis not present

## 2015-03-26 LAB — I-STAT CHEM 8, ED
BUN: 16 mg/dL (ref 6–23)
CHLORIDE: 104 mmol/L (ref 96–112)
Calcium, Ion: 1.21 mmol/L (ref 1.12–1.23)
Creatinine, Ser: 0.7 mg/dL (ref 0.50–1.35)
GLUCOSE: 108 mg/dL — AB (ref 70–99)
HEMATOCRIT: 49 % (ref 39.0–52.0)
Hemoglobin: 16.7 g/dL (ref 13.0–17.0)
POTASSIUM: 3.9 mmol/L (ref 3.5–5.1)
Sodium: 141 mmol/L (ref 135–145)
TCO2: 21 mmol/L (ref 0–100)

## 2015-03-26 LAB — CBC WITH DIFFERENTIAL/PLATELET
BASOS ABS: 0 10*3/uL (ref 0.0–0.1)
BASOS PCT: 0 % (ref 0–1)
EOS ABS: 0.2 10*3/uL (ref 0.0–0.7)
Eosinophils Relative: 2 % (ref 0–5)
HCT: 46.2 % (ref 39.0–52.0)
Hemoglobin: 15.9 g/dL (ref 13.0–17.0)
Lymphocytes Relative: 39 % (ref 12–46)
Lymphs Abs: 3.9 10*3/uL (ref 0.7–4.0)
MCH: 29.9 pg (ref 26.0–34.0)
MCHC: 34.4 g/dL (ref 30.0–36.0)
MCV: 86.8 fL (ref 78.0–100.0)
Monocytes Absolute: 0.8 10*3/uL (ref 0.1–1.0)
Monocytes Relative: 7 % (ref 3–12)
NEUTROS ABS: 5.2 10*3/uL (ref 1.7–7.7)
NEUTROS PCT: 52 % (ref 43–77)
PLATELETS: 226 10*3/uL (ref 150–400)
RBC: 5.32 MIL/uL (ref 4.22–5.81)
RDW: 12.4 % (ref 11.5–15.5)
WBC: 10.1 10*3/uL (ref 4.0–10.5)

## 2015-03-26 LAB — POC OCCULT BLOOD, ED: Fecal Occult Bld: NEGATIVE

## 2015-03-26 LAB — I-STAT CG4 LACTIC ACID, ED: Lactic Acid, Venous: 1.34 mmol/L (ref 0.5–2.0)

## 2015-03-26 MED ORDER — ONDANSETRON HCL 4 MG/2ML IJ SOLN
4.0000 mg | Freq: Once | INTRAMUSCULAR | Status: AC
  Administered 2015-03-26: 4 mg via INTRAVENOUS
  Filled 2015-03-26: qty 2

## 2015-03-26 MED ORDER — SODIUM CHLORIDE 0.9 % IV BOLUS (SEPSIS)
1000.0000 mL | Freq: Once | INTRAVENOUS | Status: AC
  Administered 2015-03-26: 1000 mL via INTRAVENOUS

## 2015-03-26 MED ORDER — IOHEXOL 350 MG/ML SOLN
80.0000 mL | Freq: Once | INTRAVENOUS | Status: AC | PRN
  Administered 2015-03-26: 80 mL via INTRAVENOUS

## 2015-03-26 MED ORDER — IOHEXOL 300 MG/ML  SOLN
100.0000 mL | Freq: Once | INTRAMUSCULAR | Status: AC | PRN
  Administered 2015-03-26: 100 mL via INTRAVENOUS

## 2015-03-26 MED ORDER — MORPHINE SULFATE 4 MG/ML IJ SOLN
4.0000 mg | Freq: Once | INTRAMUSCULAR | Status: AC
  Administered 2015-03-26: 4 mg via INTRAVENOUS
  Filled 2015-03-26: qty 1

## 2015-03-26 MED ORDER — IOHEXOL 300 MG/ML  SOLN
25.0000 mL | INTRAMUSCULAR | Status: AC
  Administered 2015-03-26: 25 mL via ORAL

## 2015-03-26 MED ORDER — HYDROCODONE-ACETAMINOPHEN 5-325 MG PO TABS: 2.0000 | ORAL_TABLET | Freq: Four times a day (QID) | ORAL | Status: DC | PRN

## 2015-03-26 NOTE — ED Notes (Signed)
STATES HIS ABDOMINAL PAIN STARTED ABOUT A WEEK AGO. STATES HE BEGAN WITH A FREQUENT COUGH DUE TO THE POLLEN AND HE NOTICED HIS STOMACH WAS FEELING SORE WITH COUGHING. STATES HE FEELS BLOATED. DENIES N/V/D. STATES HE IS ALSO HAVING SOME BLEEDING WHEN HE HAS A BM . DESCRIBES IT AS BRIGHT RED BLOOD. DENIES STRAINING TO HAVE A BM. STATES HAS HISTORY OF HEMMROIDS AND HAS SEEN A SURGEON FOR THEM.

## 2015-03-26 NOTE — ED Notes (Signed)
States remained above 97% on room air while ambulating

## 2015-03-26 NOTE — ED Notes (Signed)
Patient transported to CT 

## 2015-03-26 NOTE — ED Notes (Signed)
Pt c/o mid abd pain for several days also c/o rectal bleeding.

## 2015-03-26 NOTE — ED Notes (Signed)
Pt has finished his po contrast

## 2015-03-26 NOTE — ED Provider Notes (Signed)
CSN: [id number]     Arrival date & time 03/26/15  1524 History   First MD Initiated Contact with Patient 03/26/15 1709     Chief Complaint  Patient presents with  . Abdominal Pain     (Consider location/radiation/quality/duration/timing/severity/associated sxs/prior Treatment) HPI   54 year old male with history of lymphocytic colitis, alcohol abuse, chronic back pain, internal hemorrhoids, and GERD who presents for evaluation of abdominal pain. Pt report since returning from a trip to [place] last week he has been having persistent R side abdominal pain.  Pain is a crampy sharp sensation, worsening with eating/drinking or coughing.  He endorse increase bowel movement and also having increase rectal bleeding which he described as BRBPR when wipes and associated with hemorrhoids he had in the past.  Report he is scheduled for a repeat colonoscopy by Dr. Carlean Purl but has not f/u.  He also report prior treatment for his hemorrhoid including injection to help sclerose the vessels which has helped. Currently denies fever, chills, cp, sob, nvd, dysuria or rash.    Past Medical History  Diagnosis Date  . GERD (gastroesophageal reflux disease)   . Gout   . Adenomatous colon polyp 2009  . Lymphocytic colitis 2009  . COPD (chronic obstructive pulmonary disease)   . Hypertension   . NASH (nonalcoholic steatohepatitis)   . Hyperlipidemia   . TIA (transient ischemic attack)   . Asthma   . Alcohol abuse     abstinent since 1992  . Hemorrhoids   . Chronic back pain   . Personal history of colonic polyps 07/20/2002    Diminutive adenomas (3) 06/2002 diminutive adenoma (1) 2011    Past Surgical History  Procedure Laterality Date  . Lumbar spine surgery      x 2  . Nasal sinus surgery    . 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 Colonoscopy  multiple  . Band hemorrhoidectomy  2003    at sigmoidoscopy  . Tee without cardioversion  05/28/2012    Procedure: TRANSESOPHAGEAL ECHOCARDIOGRAM (TEE);   Surgeon: Lelon Perla, MD;  Location: [facility] ENDOSCOPY;  Service: Cardiovascular;  Laterality: N/A;   Family History  Problem Relation Age of Onset  . Liver disease Mother   . Liver cancer Mother   . Colon cancer Neg Hx   . Heart disease      multiple family members on maternal and paternal side of family  . Breast cancer Mother   . Diabetes Sister   . Diabetes Paternal Grandmother    History  Substance Use Topics  . Smoking status: Current Every Day Smoker -- 1.00 packs/day for 41 years    Types: Cigarettes  . Smokeless tobacco: Never Used  . Alcohol Use: No    Review of Systems  Constitutional: Negative for fever.  Gastrointestinal: Positive for abdominal pain.  All other systems reviewed and are negative.     Allergies  Penicillins  Home Medications   Prior to Admission medications   Medication Sig Start Date End Date Taking? Authorizing Provider  allopurinol (ZYLOPRIM) 100 MG tablet Take 100 mg by mouth daily.    Historical Provider, MD  amLODipine (NORVASC) 10 MG tablet Take 10 mg by mouth daily.    Historical Provider, MD  aspirin 325 MG tablet Take 325 mg by mouth daily.    Historical Provider, MD  esomeprazole (NEXIUM) 40 MG capsule Take 40 mg by mouth 2 (two) times daily.    Historical Provider, MD  hydrocortisone (ANUSOL-HC) 2.5 %  rectal cream Place 1 application rectally daily as needed for hemorrhoids.    Historical Provider, MD  losartan (COZAAR) 100 MG tablet Take 100 mg by mouth daily.    Historical Provider, MD  metoprolol (LOPRESSOR) 100 MG tablet Take 100 mg by mouth 2 (two) times daily.    Historical Provider, MD  Milk Thistle 500 MG CAPS Take 1,000 mg by mouth daily.     Historical Provider, MD  Multiple Vitamin (MULITIVITAMIN WITH MINERALS) TABS Take 1 tablet by mouth daily.    Historical Provider, MD  naproxen (NAPROSYN) 500 MG tablet Take 500 mg by mouth daily. For pain/fever    Historical Provider, MD  nitroGLYCERIN (NITROSTAT) 0.4 MG SL tablet  Place 0.4 mg under the tongue every 5 (five) minutes as needed. For chest pain    Historical Provider, MD  Omega-3 Fatty Acids (FISH OIL) 1200 MG CPDR Take 1,200 mg by mouth daily.    Historical Provider, MD  predniSONE (STERAPRED UNI-PAK) 10 MG tablet Take by mouth daily. Day 1: take 6 tabs.  Day 2: 5 tabs  Day 3: 4 tabs  Day 4: 3 tabs  Day 5: 2 tabs  Day 6: 1 tab 10/23/14   [name], PA-C  spironolactone (ALDACTONE) 100 MG tablet Take 100 mg by mouth daily.    Historical Provider, MD  traZODone (DESYREL) 50 MG tablet Take 50 mg by mouth at bedtime.    Historical Provider, MD   BP 164/92 mmHg  Pulse 90  Temp(Src) 98.4 F (36.9 C)  Resp 18  Ht 5' 9"  (1.753 m)  Wt 220 lb (99.791 kg)  BMI 32.47 kg/m2  SpO2 99% Physical Exam  Constitutional: He appears well-developed and well-nourished. No distress.  Pt sitting up in bed, in no acute distress, non toxic in appearance.   HENT:  Head: Atraumatic.  Eyes: Conjunctivae are normal.  Neck: Normal range of motion. Neck supple.  Cardiovascular: Normal rate and regular rhythm.   Pulmonary/Chest: Effort normal and breath sounds normal.  Abdominal: Soft. There is tenderness (Tenderness to R mid abdomen on palpation.  no guarding, no rebound tenderness.  ).  Genitourinary:  No cva tenderness  Neurological: He is alert.  Skin: No rash noted.  Psychiatric: He has a normal mood and affect.    ED Course  Procedures (including critical care time)  11:28 PM Pt report c/o R mid abdominal pain x 1 week, worsening with coughing.  Denies any SOB or hemoptysis.  Does have reproducible pain to R mid abdomen on exam.  Abdominal/pelvis CT scan obtain showing abnormal diameter and wall definition of the celiac axis with subtle perivascular edema/inflammation.  Changes may be related to acute to subacute thrombosis, dissection, or vasculitis.  It was recommended to obtain a dedicated mesenteric CTA for further evaluation.  Given that pt has normal renal  function, and pt does not want to stay overnight, i did discussed this with radiologist, and with DR. Colin Rhein and we agree to obtain a dedicated abd/pelvis CTA.  The result of the CTA shows no obvious thrombosis or ischemic changes.  Narrowing of the lumen of the Celiac axis were noted.  Suspect vasculitis as a cause.  At this time, pt is stable and does not want to be admitted.  Finding does not show indication for emergent treatment.  Pt is stable.  Plan to have pt f/u with PCP along with vascular surgeon for outpt evaluation.  Will also obtain Sed Rate and CRP marker.  Result will not  be available during this visit as pt request to be discharge.  Return precaution discussed.    Also, at this time i have low suspicion for PE as a cause, given no complaint of SOB, hemoptysis, or pleuritic chest pain. Pt ambulate maintaining 98% on RA.    12:32 AM Although the CT angio abd/pelvis result has not populated, Radiologist Dr. Tery Sanfilippo did called and notified that there are no evidence of ischemic changes.  Pt to f/u with vascular surgeon for outpt evaluation.  Care discussed with Dr. Colin Rhein.  Labs Review Labs Reviewed  I-STAT CHEM 8, ED - Abnormal; Notable for the following:    Glucose, Bld 108 (*)    All other components within normal limits  CBC WITH DIFFERENTIAL/PLATELET  SEDIMENTATION RATE  C-REACTIVE PROTEIN  POC OCCULT BLOOD, ED  I-STAT CG4 LACTIC ACID, ED    Imaging Review Ct Abdomen Pelvis W Contrast  03/26/2015   ADDENDUM REPORT: 03/26/2015 21:39  ADDENDUM: These findings were discussed with Dr. Colin Rhein at the time of study interpretation.   Electronically Signed   By: Misty Stanley M.D.   On: 03/26/2015 21:39   03/26/2015   CLINICAL DATA:  Initial encounter for several day history of right-sided mid abdominal pain with rectal bleeding.  EXAM: CT ABDOMEN AND PELVIS WITH CONTRAST  TECHNIQUE: Multidetector CT imaging of the abdomen and pelvis was performed using the standard protocol following  bolus administration of intravenous contrast.  CONTRAST:  1 OMNIPAQUE IOHEXOL 300 MG/ML SOLN, 173m OMNIPAQUE IOHEXOL 300 MG/ML SOLN  COMPARISON:  None.  FINDINGS: Lower chest:  Unremarkable.  Hepatobiliary: The liver shows diffusely decreased attenuation suggesting steatosis. Fatty sparing is seen along the gallbladder fossa. There is no evidence for gallstones, gallbladder wall thickening, or pericholecystic fluid. No intrahepatic or extrahepatic biliary dilation.  Pancreas: No focal mass lesion. No dilatation of the main duct. No intraparenchymal cyst. No peripancreatic edema.  Spleen: No splenomegaly. No focal mass lesion.  Adrenals/Urinary Tract: No adrenal nodule or mass. Tiny hypodensities in the left kidney are likely cysts. No evidence for hydroureter. Urinary bladder is normal in appearance.  Stomach/Bowel: Stomach is nondistended. No gastric wall thickening. No evidence of outlet obstruction. Duodenum is normally positioned as is the ligament of Treitz. No small bowel wall thickening. No small bowel dilatation. Terminal ileum is normal. Appendix is normal. No gross colonic mass. No colonic wall thickening. No substantial diverticular change.  Vascular/Lymphatic: Atherosclerotic calcification is noted in the wall of the abdominal aorta without aneurysm. The celiac axis is slightly ill-defined, appears enlarged and there is subtle perivascular stranding. Coronal reconstructions suggest at the enhancement in the lumen of the celiac axis is less than the adjacent SMA. No evidence for lymphadenopathy in the abdomen. No pelvic sidewall lymphadenopathy.  Reproductive: Dystrophic calcification noted within the prostatic parenchyma. The seminal vesicles are unremarkable.  Other: There may be an extremely trace amount of fluid in pelvis, but this is not a definite finding.  Musculoskeletal: Bone windows reveal no worrisome lytic or sclerotic osseous lesions.  IMPRESSION: 1. Abnormal diameter and wall definition of  the celiac axis with subtle perivascular edema/inflammation. Coronal reconstructions suggest decreased intraluminal enhancement in this region. Changes may be related to acute to subacute thrombosis, dissection, or vasculitis. Dedicated mesenteric CTA may prove helpful to further evaluate. No evidence for associated bowel wall thickening or substantial intraperitoneal free fluid to suggest mesenteric ischemia at this time. The superior mesenteric artery and inferior mesenteric artery appear patent.  Electronically Signed: By: ERandall Hiss  Tery Sanfilippo M.D. On: 03/26/2015 21:19     EKG Interpretation None      MDM   Final diagnoses:  Abnormal abdominal CT scan  Generalized abdominal pain    BP 145/96 mmHg  Pulse 58  Temp(Src) 98.2 F (36.8 C) (Oral)  Resp 20  Ht 5' 9"  (1.753 m)  Wt 220 lb (99.791 kg)  BMI 32.47 kg/m2  SpO2 95%  I have reviewed nursing notes and vital signs. I personally reviewed the imaging tests through PACS system  I reviewed available ER/hospitalization records thought the EMR     Domenic Moras, PA-C 03/27/15 0034  Debby Freiberg, MD 03/28/15 [phone]

## 2015-03-27 NOTE — Discharge Instructions (Signed)
You have been evaluated for your abdominal pain.  It was noted that the celiac artery that branch out from your aorta is abnormal in size.  This could be due to inflammation of the vessel (vasculitis) which will need to be further evaluation by a vascular surgeon.  Please follow up with your primary care provider and discuss this finding.  Return to ER if your condition worsen or if you have other concerns.    Incidental Abdominal Radiologic Finding An incidental abdominal radiologic finding happens during an imaging exam of your abdominal region. The finding is an abnormality, and it is incidental because it is unrelated to the reason your caregiver prescribed the exam. The abnormality is usually a very small mass or scar tissue.  With newer imaging tests and technologies, it is becoming more common to find small masses and tissue when looking for other things. These findings can sometimes be related to undiagnosed illness. Although many are not a cause for concern, it is always a good idea to talk to your caregiver about what was found.  TYPES OF FINDINGS  Incidental abdominal radiologic findings are often located in the kidneys or liver, but they can also be found in the gallbladder, adrenal glands, intestines, and other surrounding organs and tissues. There are many types of masses and tissue abnormalities that can be detected during an imaging test:  Lesions. Changes in tissue due to infection, tissue death, or trauma.  Cysts. Sacs filled with fluid or some other substance.  Tumors. Noncancerous or cancerous solid formation. ADDITIONAL TESTING AND DIAGNOSIS Additional tests may be needed when the finding is first noted. After these tests, your caregiver may request periodic follow-up tests to determine if any changes are occurring. Tests may include:  A physical exam.  Blood tests.  Urine tests.  Imaging tests, such as abdominal ultrasonography, computerized X-ray scan (computed  tomography, CT), and computerized magnetic scan (MRI).  Biopsy. TREATMENT  Treatment, if any, varies and depends on:  The cause.  Location.  Size.  Appearance of the finding. Treatment will also depend on your age and underlying conditions or symptoms. Treatment may include:  Watchful waiting, with periodic examination and testing.  Treatments to reduce the size of the mass or abnormality.  Surgical removal of the mass or abnormality. HOME CARE INSTRUCTIONS  See your caregiver for follow-up exams as directed. Your caregiver will let you know if this is routine follow-up or if there is a reason for concern. Early detection can be very beneficial to you. Follow up with your caregiver to find out the results of your tests. Not all test results may be available during your visit. If your test results are not back during the visit, make an appointment with your caregiver to find out the results. Do not assume everything is normal if you have not heard from your caregiver or the medical facility. It is important for you to follow up on all of your test results. MAKE SURE YOU:   Understand these instructions.  Will watch your condition.  Will get help right away if you are not doing well or get worse. Document Released: 06/04/2010 Document Revised: 05/01/2014 Document Reviewed: 06/04/2010 [facility] Patient Information 2015 [place], [place]. This information is not intended to replace advice given to you by your health care provider. Make sure you discuss any questions you have with your health care provider.

## 2015-04-16 ENCOUNTER — Encounter: Payer: Self-pay | Admitting: Vascular Surgery

## 2015-04-17 ENCOUNTER — Ambulatory Visit (INDEPENDENT_AMBULATORY_CARE_PROVIDER_SITE_OTHER): Payer: Medicare Other | Admitting: Vascular Surgery

## 2015-04-17 ENCOUNTER — Encounter: Payer: Self-pay | Admitting: Vascular Surgery

## 2015-04-17 VITALS — BP 150/92 | HR 79 | Ht 69.0 in | Wt 230.3 lb

## 2015-04-17 DIAGNOSIS — I771 Stricture of artery: Secondary | ICD-10-CM

## 2015-04-17 DIAGNOSIS — R1011 Right upper quadrant pain: Secondary | ICD-10-CM

## 2015-04-17 DIAGNOSIS — I774 Celiac artery compression syndrome: Secondary | ICD-10-CM

## 2015-04-17 NOTE — Progress Notes (Addendum)
HISTORY AND PHYSICAL     CC:  Stomach pain Referring Provider:  Shirline Frees, MD  HPI: This is a 54 y.o. male who states that he was vacationing in [place] about 4 weeks ago.  He states that their pollen was bad and he started coughing.  He coughed the whole 9 hour drive back and was sore.  He thought his abdominal pain was from this, but it has persisted and his cough has resolved.  He presented to the ED at the end of March to have this evaluated.  He states that it is sometimes worsened when eating.  He has not been losing any weight.  He denies any fever or nausea and vomiting.  He states that he does have some bloating, which lasts a couple of days and his pants are tighter.    He is on a beta blocker and ARB for his HTN.    He has a hx of back surgery ~ 10 years ago.   Past Medical History  Diagnosis Date  . GERD (gastroesophageal reflux disease)   . Gout   . Adenomatous colon polyp 2009  . Lymphocytic colitis 2009  . COPD (chronic obstructive pulmonary disease)   . Hypertension   . NASH (nonalcoholic steatohepatitis)   . Hyperlipidemia   . TIA (transient ischemic attack)   . Asthma   . Alcohol abuse     abstinent since 1992  . Hemorrhoids   . Chronic back pain   . Personal history of colonic polyps 07/20/2002    Diminutive adenomas (3) 06/2002 diminutive adenoma (1) 2011   . Stroke   . Peripheral vascular disease     Past Surgical History  Procedure Laterality Date  . Lumbar spine surgery      x 2  . Nasal sinus surgery    . 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 Colonoscopy  multiple  . Band hemorrhoidectomy  2003    at sigmoidoscopy  . Tee without cardioversion  05/28/2012    Procedure: TRANSESOPHAGEAL ECHOCARDIOGRAM (TEE);  Surgeon: Lelon Perla, MD;  Location: [facility] ENDOSCOPY;  Service: Cardiovascular;  Laterality: N/A;    Allergies  Allergen Reactions  . Penicillins Hives and Rash    Current Outpatient Prescriptions  Medication Sig Dispense  Refill  . allopurinol (ZYLOPRIM) 100 MG tablet Take 100 mg by mouth daily.    Marland Kitchen amLODipine (NORVASC) 10 MG tablet Take 10 mg by mouth daily.    Marland Kitchen aspirin 325 MG tablet Take 325 mg by mouth daily.    Marland Kitchen esomeprazole (NEXIUM) 40 MG capsule Take 40 mg by mouth 2 (two) times daily.    Marland Kitchen losartan (COZAAR) 100 MG tablet Take 100 mg by mouth daily.    . metoprolol (LOPRESSOR) 100 MG tablet Take 100 mg by mouth 2 (two) times daily.    . Milk Thistle 500 MG CAPS Take 1,000 mg by mouth daily.     . Multiple Vitamin (MULITIVITAMIN WITH MINERALS) TABS Take 1 tablet by mouth daily.    . naproxen (NAPROSYN) 500 MG tablet Take 500 mg by mouth daily. For pain/fever    . nitroGLYCERIN (NITROSTAT) 0.4 MG SL tablet Place 0.4 mg under the tongue every 5 (five) minutes as needed. For chest pain    . Omega-3 Fatty Acids (FISH OIL) 1200 MG CPDR Take 1,200 mg by mouth daily.    . traZODone (DESYREL) 50 MG tablet Take 50 mg by mouth at bedtime.    Marland Kitchen HYDROcodone-acetaminophen (NORCO/VICODIN)  5-325 MG per tablet Take 2 tablets by mouth every 6 (six) hours as needed for moderate pain or severe pain. (Patient not taking: Reported on 04/17/2015) 12 tablet 0  . hydrocortisone (ANUSOL-HC) 2.5 % rectal cream Place 1 application rectally daily as needed for hemorrhoids.    . predniSONE (STERAPRED UNI-PAK) 10 MG tablet Take by mouth daily. Day 1: take 6 tabs.  Day 2: 5 tabs  Day 3: 4 tabs  Day 4: 3 tabs  Day 5: 2 tabs  Day 6: 1 tab (Patient not taking: Reported on 03/26/2015) 21 tablet 0  . spironolactone (ALDACTONE) 100 MG tablet Take 100 mg by mouth daily.     No current facility-administered medications for this visit.    Family History  Problem Relation Age of Onset  . Liver disease Mother   . Liver cancer Mother   . Breast cancer Mother   . Cancer Mother   . Hypertension Mother   . Heart attack Mother   . Colon cancer Neg Hx   . Heart disease      multiple family members on maternal and paternal side of family  .  Diabetes Sister   . Hyperlipidemia Sister   . Hypertension Sister   . Diabetes Paternal Grandmother   . Cancer Father   . Heart disease Father     before age 25  . Hyperlipidemia Father   . Hypertension Father   . Heart attack Father   . Hypertension Brother     History   Social History  . Marital Status: Married    Spouse Name: N/A  . Number of Children: 3  . Years of Education: N/A   Occupational History  . Disabled    Social History Main Topics  . Smoking status: Current Every Day Smoker -- 1.00 packs/day for 41 years    Types: Cigarettes  . Smokeless tobacco: Never Used  . Alcohol Use: No  . Drug Use: No  . Sexual Activity: Not on file   Other Topics Concern  . Not on file   Social History Narrative     ROS: [x]  Positive   [ ]  Negative   [ ]  All sytems reviewed and are negative  Cardiovascular: []  chest pain/pressure []  palpitations []  SOB lying flat []  DOE []  pain in legs while walking []  pain in feet when lying flat []  hx of DVT []  hx of phlebitis []  swelling in legs []  varicose veins  Pulmonary: [x]   Cough-resolved []  asthma []  wheezing  Neurologic: []  weakness in []  arms []  legs []  numbness in []  arms []  legs [] difficulty speaking or slurred speech []  temporary loss of vision in one eye []  dizziness  Hematologic: []  bleeding problems []  problems with blood clotting easily  GI []  vomiting blood []  blood in stool [x]  bloating   GU: []  burning with urination []  blood in urine  Psychiatric: []  hx of major depression  Integumentary: []  rashes []  ulcers  Constitutional: []  fever []  chills   PHYSICAL EXAMINATION:  Filed Vitals:   04/17/15 1457  BP: 150/92  Pulse:    Body mass index is 33.99 kg/(m^2).  General:  WDWN in NAD Gait: Not observed HENT: WNL, normocephalic Pulmonary: normal non-labored breathing , without Rales, rhonchi,  wheezing Cardiac: RRR, without  Murmurs, rubs or gallops; without carotid  bruits Abdomen: soft, no masses; no abdominal bruits; no evidence of peritonitis; mild right sided tenderness with palpation Skin: without rashes, without ulcers  Vascular Exam/Pulses:  Right Left  Radial 2+ (normal) 2+ (normal)  Femoral 2+ (normal) 2+ (normal)  Popliteal 2+ (normal) 2+ (normal)  DP 2+ (normal) 2+ (normal)  PT 2+ (normal) 2+ (normal)   Extremities: without ischemic changes, without Gangrene , without cellulitis; without open wounds;  Musculoskeletal: no muscle wasting or atrophy  Neurologic: A&O X 3; Appropriate Affect ; SENSATION: normal; MOTOR FUNCTION:  moving all extremities equally. Speech is fluent/normal   Non-Invasive Vascular Imaging:   CTA 03/26/15: IMPRESSION: There is an abnormal appearance of the celiac axis characterized by wall thickening and inflammation with subsequent luminal narrowing. The appearance is nonspecific. Consider acute exacerbation of atherosclerosis, vasculitis, mycotic process. There is no filling defect or embolic phenomenon associated with the process.  Other moderate changes related to atherosclerosis are noted. There is some plaque in the SMA. There is moderate narrowing at the origin of the left renal artery. There is significant narrowing at the origin of the left internal iliac artery. Atherosclerotic changes of the infrarenal aorta with irregular plaque are present  Pt meds includes: Statin:  No. Beta Blocker:  Yes.   Aspirin:  Yes.   ACEI:  No. ARB:  Yes.   Other Antiplatelet/Anticoagulant:  No.    ASSESSMENT/PLAN:: 54 y.o. male with right sided abdominal pain recently seen in the ER with CTA of abdomen/pelvis  -CTA revealed a thickening/inflammation with subsequent luminal narrowing.  There was no filling defect or embolic phenomenon associated with the process.  -his symptoms are not consistent with mesenteric ischemia and this is most likely not causing his symptoms.   -we will schedule an appointment with Dr.  Carlean Purl to have him evaluated for his abdominal pain.  Pt states that it is time for his colonoscopy and he may want to perform EGD at that time. -will have pt f/u in 6 months with CTA to re-evaluate celiac artery. There is narrowing of the left internal iliac artery, which would not be causing his pain.  We will re-evaluate this as well in 6 months with CTA     Leontine Locket, PA-C [facility] [phone]  Clinic MD:  Pt seen and examined in conjunction with Dr. Donnetta Hutching  I have examined the patient, reviewed and agree with above. Unusual presentation of abdominal pain. Did review his CT scan and actual images. Discussed this at length with the patient and his wife present. 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 This is not causing any flow limitation. SMA and internal iliac artery are widely patent. No evidence on of bowel ischemia on CT scan. Examination is totally benign today.  Unclear as to etiology of his ongoing abdominal pain. Do not feel that this is related to the irregularity of the celiac artery but explain the patient's and possible rule this out. We will refer him back to Dr. Carlean Purl for further evaluation of his abdominal pain. We'll plan to see him in 6 months to repeat CT scan to rule out any change in the celiac artery configuration. We'll see him sooner if Dr. Carlean Purl feels this is warranted.  EARLY, TODD, MD 04/17/2015 4:00 PM

## 2015-04-17 NOTE — Addendum Note (Signed)
Addended by: Thresa Ross C on: 04/17/2015 04:00 PM   Modules accepted: Orders

## 2015-04-19 NOTE — Addendum Note (Signed)
Addended by: Mena Goes on: 04/19/2015 01:16 PM   Modules accepted: Orders

## 2015-05-02 ENCOUNTER — Ambulatory Visit (INDEPENDENT_AMBULATORY_CARE_PROVIDER_SITE_OTHER): Payer: Medicare Other | Admitting: Nurse Practitioner

## 2015-05-02 ENCOUNTER — Encounter: Payer: Self-pay | Admitting: Nurse Practitioner

## 2015-05-02 VITALS — BP 140/76 | HR 68 | Ht 69.0 in | Wt 228.0 lb

## 2015-05-02 DIAGNOSIS — R1011 Right upper quadrant pain: Secondary | ICD-10-CM

## 2015-05-02 DIAGNOSIS — E8889 Other specified metabolic disorders: Secondary | ICD-10-CM | POA: Insufficient documentation

## 2015-05-02 DIAGNOSIS — E7889 Other lipoprotein metabolism disorders: Secondary | ICD-10-CM | POA: Insufficient documentation

## 2015-05-02 DIAGNOSIS — R933 Abnormal findings on diagnostic imaging of other parts of digestive tract: Secondary | ICD-10-CM

## 2015-05-02 DIAGNOSIS — Z86010 Personal history of colonic polyps: Secondary | ICD-10-CM

## 2015-05-02 NOTE — Patient Instructions (Addendum)
You have been scheduled for an endoscopy and colonoscopy. Please follow the written instructions given to you at your visit today. Please pick up your prep supplies at the pharmacy within the next 1-3 days. If you use inhalers (even only as needed), please bring them with you on the day of your procedure.  Please hold Naproxen

## 2015-05-02 NOTE — Progress Notes (Signed)
     History of Present Illness:  Patient is a 54 year old male known to Dr. Carlean Purl. He has a history of lymphocytic colitis,  C. Difficile infection, adenomatous colon polyps and steatosis. Patient referred by Dr. Donnetta Hutching (Vascular Surgery). Patient seen in ED 03/26/15 for right sided abdominal pain. CT scan of the abdomen and pelvis showed abnormal appearance of the celiac axis with wall thickening and inflammation with subsequent luminal narrowing. Patient was referred to Vascular Surgery who did not feel that CT findings were source of abdominal pain. No bowel ischemia on CT scan.  Plan per vascular was to repeat CT scan in 6 months.  Current Medications, Allergies, Past Medical History, Past Surgical History, Family History and Social History were reviewed in [employer] record.  Physical Exam: General: Pleasant, well developed , white male in no acute distress Head: Normocephalic and atraumatic Eyes:  sclerae anicteric, conjunctiva pink  Ears: Normal auditory acuity Lungs: Clear throughout to auscultation Heart: Regular rate and rhythm Abdomen: Soft, non distended, non-tender. No masses, no hepatomegaly. Normal bowel sounds Musculoskeletal: Symmetrical with no gross deformities  Extremities: No edema  Neurological: Alert oriented x 4, grossly nonfocal Psychological:  Alert and cooperative. Normal mood and affect  Assessment and Recommendations:  36. 54 year old male with recent development of acute right mid and RUQ abdominal pain. CT angio suggests inflammation and narrowing of celiac axis. Vascular Surgery not convinced his pain is related to CTA findings. Patient has taken Naprosyn for year, rule out NSAID induced mucosal injury. For further evaluation patient will be scheduled for EGD. The benefits, risks, and potential complications of EGD with possible biopsies were discussed with the patient and he agrees to proceed.   2. Colon cancer screening. Patient due  for repeat colonoscopy (he got recall letter). Will arrange for colonoscopy to be done, hopefully at same time as EGD. The risks, benefits, and alternatives to colonoscopy with possible biopsy and possible polypectomy were discussed with the patient and he consents to proceed.   Cc: Curt Jews, MD

## 2015-05-07 NOTE — Progress Notes (Signed)
Agree with Ms. Guenther's assessment and plan. Gatha Mayer, MD, [facility]

## 2015-05-16 ENCOUNTER — Other Ambulatory Visit: Payer: Self-pay | Admitting: Internal Medicine

## 2015-05-16 ENCOUNTER — Ambulatory Visit (AMBULATORY_SURGERY_CENTER): Payer: Medicare Other | Admitting: Internal Medicine

## 2015-05-16 ENCOUNTER — Other Ambulatory Visit (INDEPENDENT_AMBULATORY_CARE_PROVIDER_SITE_OTHER): Payer: Medicare Other

## 2015-05-16 ENCOUNTER — Encounter: Payer: Self-pay | Admitting: Internal Medicine

## 2015-05-16 VITALS — BP 143/90 | HR 61 | Temp 96.8°F | Resp 23 | Ht 69.0 in | Wt 228.0 lb

## 2015-05-16 DIAGNOSIS — R198 Other specified symptoms and signs involving the digestive system and abdomen: Secondary | ICD-10-CM

## 2015-05-16 DIAGNOSIS — R1011 Right upper quadrant pain: Secondary | ICD-10-CM

## 2015-05-16 DIAGNOSIS — I2510 Atherosclerotic heart disease of native coronary artery without angina pectoris: Secondary | ICD-10-CM | POA: Diagnosis not present

## 2015-05-16 DIAGNOSIS — Z8673 Personal history of transient ischemic attack (TIA), and cerebral infarction without residual deficits: Secondary | ICD-10-CM | POA: Diagnosis not present

## 2015-05-16 DIAGNOSIS — I10 Essential (primary) hypertension: Secondary | ICD-10-CM | POA: Diagnosis not present

## 2015-05-16 DIAGNOSIS — R933 Abnormal findings on diagnostic imaging of other parts of digestive tract: Secondary | ICD-10-CM

## 2015-05-16 DIAGNOSIS — K2270 Barrett's esophagus without dysplasia: Secondary | ICD-10-CM | POA: Diagnosis not present

## 2015-05-16 DIAGNOSIS — R1013 Epigastric pain: Secondary | ICD-10-CM | POA: Diagnosis not present

## 2015-05-16 DIAGNOSIS — Z6833 Body mass index (BMI) 33.0-33.9, adult: Secondary | ICD-10-CM | POA: Diagnosis not present

## 2015-05-16 DIAGNOSIS — K7460 Unspecified cirrhosis of liver: Secondary | ICD-10-CM | POA: Diagnosis not present

## 2015-05-16 LAB — HEPATIC FUNCTION PANEL
ALT: 48 U/L (ref 0–53)
AST: 34 U/L (ref 0–37)
Albumin: 4.3 g/dL (ref 3.5–5.2)
Alkaline Phosphatase: 136 U/L — ABNORMAL HIGH (ref 39–117)
Bilirubin, Direct: 0.1 mg/dL (ref 0.0–0.3)
Total Bilirubin: 0.7 mg/dL (ref 0.2–1.2)
Total Protein: 6.9 g/dL (ref 6.0–8.3)

## 2015-05-16 MED ORDER — SODIUM CHLORIDE 0.9 % IV SOLN: 500.0000 mL | INTRAVENOUS | Status: DC

## 2015-05-16 NOTE — Op Note (Signed)
[facility]  [address] [place], [address]   ENDOSCOPY PROCEDURE REPORT  PATIENT: [name], [name]  MR#: [id number] BIRTHDATE: May 13, 1961 , 37  yrs. old GENDER: male ENDOSCOPIST: Gatha Mayer, MD, [facility] PROCEDURE DATE:  05/16/2015 PROCEDURE:  EGD w/ biopsy ASA CLASS:     Class III INDICATIONS:  abdominal pain in the upper right quadrant. MEDICATIONS: Propofol 150 mg IV TOPICAL ANESTHETIC: none  DESCRIPTION OF PROCEDURE: After the risks benefits and alternatives of the procedure were thoroughly explained, informed consent was obtained.  The LB [id number] endoscope was introduced through the mouth and advanced to the second portion of the duodenum , Without limitations.  The instrument was slowly withdrawn as the mucosa was fully examined.    1) 5-10 mm columnar tongues in distal esophagus - ? Barrett's esophagus - biopsies taken. 2) Otherwise normal esophagus, stomach, and duodenum.  Retroflexed views revealed no abnormalities.     The scope was then withdrawn from the patient and the procedure completed.  COMPLICATIONS: There were no immediate complications.  ENDOSCOPIC IMPRESSION: 1) 5-10 mm columnar tongues in distal esophagus - ? Barrett's esophagus - biopsies taken. 2) Otherwise normal esophagus, stomach, and duodenum  RECOMMENDATIONS: 1.  Await biopsy results 2.  I think pain could be liver or gallbladder. LFT's today Has adenomyomatosis and fatty liver - ? further imaging vs [facility] evaluation - will call    eSigned:  Gatha Mayer, MD, [facility] 05/16/2015 10:19 AM    CC: Curt Jews, MD, Samara Snide, MD and The Patient

## 2015-05-16 NOTE — Patient Instructions (Addendum)
I found some changes in the lining of the esophagus that could represent a condition called barrett's esophagus. That is a precancerous condition that would need follow-up exam if present. It is very rare that cancer occurs from it.  I am not sure where your pain is coming from but think it could be the gallbladder or liver perhaps.  I think you need additional labs today (go to basement) and I will discuss additional testing with you when they return.  I appreciate the opportunity to care for you. Gatha Mayer, MD, FACG  YOU HAD AN ENDOSCOPIC PROCEDURE TODAY AT [facility]:   Refer to the procedure report that was given to you for any specific questions about what was found during the examination.  If the procedure report does not answer your questions, please call your gastroenterologist to clarify.  If you requested that your care partner not be given the details of your procedure findings, then the procedure report has been included in a sealed envelope for you to review at your convenience later.  YOU SHOULD EXPECT: Some feelings of bloating in the abdomen. Passage of more gas than usual.  Walking can help get rid of the air that was put into your GI tract during the procedure and reduce the bloating.   Please Note:  You might notice some irritation and congestion in your nose or some drainage.  This is from the oxygen used during your procedure.  There is no need for concern and it should clear up in a day or so.  SYMPTOMS TO REPORT IMMEDIATELY:   Following upper endoscopy (EGD)  Vomiting of blood or coffee ground material  New chest pain or pain under the shoulder blades  Painful or persistently difficult swallowing  New shortness of breath  Fever of 100F or higher  Black, tarry-looking stools  For urgent or emergent issues, a gastroenterologist can be reached at any hour by calling [phone].   DIET: Your first meal following the procedure  should be a small meal and then it is ok to progress to your normal diet. Heavy or fried foods are harder to digest and may make you feel nauseous or bloated.  Likewise, meals heavy in dairy and vegetables can increase bloating.  Drink plenty of fluids but you should avoid alcoholic beverages for 24 hours.  ACTIVITY:  You should plan to take it easy for the rest of today and you should NOT DRIVE or use heavy machinery until tomorrow (because of the sedation medicines used during the test).    FOLLOW UP: Our staff will call the number listed on your records the next business day following your procedure to check on you and address any questions or concerns that you may have regarding the information given to you following your procedure. If we do not reach you, we will leave a message.  However, if you are feeling well and you are not experiencing any problems, there is no need to return our call.  We will assume that you have returned to your regular daily activities without incident.  If any biopsies were taken you will be contacted by phone or by letter within the next 1-3 weeks.  Please call us at [phone] if you have not heard about the biopsies in 3 weeks.   SIGNATURES/CONFIDENTIALITY: You and/or your care partner have signed paperwork which will be entered into your electronic medical record.  These signatures attest to the fact that that the  information above on your After Visit Summary has been reviewed and is understood.  Full responsibility of the confidentiality of this discharge information lies with you and/or your care-partner.  Please go to lab before you leave today  Continue your normal medications

## 2015-05-16 NOTE — Progress Notes (Signed)
Called to room to assist during endoscopic procedure.  Patient ID and intended procedure confirmed with present staff. Received instructions for my participation in the procedure from the performing physician.  

## 2015-05-16 NOTE — Progress Notes (Signed)
  [place] [facility]est[facility] Post-op Note  Patient: [name] [name]  Procedure(s) Performed: endoscopy  Patient Location: [facility] - Recovery Area  Anesthesia Type: Deep Sedation/Propofol  Level of Consciousness: awake, oriented and patient cooperative  Airway and Oxygen Therapy: Patient Spontanous Breathing  Post-op Pain: none  Post-op Assessment:  Post-op Vital signs reviewed, Patient's Cardiovascular Status Stable, Respiratory Function Stable, Patent Airway, No signs of Nausea or vomiting and Pain level controlled  Post-op Vital Signs: Reviewed and stable  Complications: No apparent anesthesia complications  Jolicia Delira E 10:07 AM

## 2015-05-17 ENCOUNTER — Telehealth: Payer: Self-pay | Admitting: *Deleted

## 2015-05-17 NOTE — Telephone Encounter (Signed)
  Follow up Call-  Call back number 05/16/2015  Post procedure Call Back phone  # [phone]  Permission to leave phone message Yes     Patient questions:  Message left to call us if necessary.

## 2015-05-21 ENCOUNTER — Emergency Department (HOSPITAL_COMMUNITY)
Admission: EM | Admit: 2015-05-21 | Discharge: 2015-05-21 | Disposition: A | Discharge status: Home / Self Care | Payer: Medicare Other | Attending: Emergency Medicine | Admitting: Emergency Medicine

## 2015-05-21 ENCOUNTER — Emergency Department (HOSPITAL_COMMUNITY): Payer: Medicare Other

## 2015-05-21 ENCOUNTER — Encounter (HOSPITAL_COMMUNITY): Payer: Self-pay | Admitting: Family Medicine

## 2015-05-21 DIAGNOSIS — R1013 Epigastric pain: Secondary | ICD-10-CM | POA: Diagnosis not present

## 2015-05-21 DIAGNOSIS — K219 Gastro-esophageal reflux disease without esophagitis: Secondary | ICD-10-CM | POA: Diagnosis not present

## 2015-05-21 DIAGNOSIS — Z880 Allergy status to penicillin: Secondary | ICD-10-CM | POA: Diagnosis not present

## 2015-05-21 DIAGNOSIS — M109 Gout, unspecified: Secondary | ICD-10-CM | POA: Diagnosis not present

## 2015-05-21 DIAGNOSIS — R110 Nausea: Secondary | ICD-10-CM | POA: Diagnosis not present

## 2015-05-21 DIAGNOSIS — Z8639 Personal history of other endocrine, nutritional and metabolic disease: Secondary | ICD-10-CM | POA: Insufficient documentation

## 2015-05-21 DIAGNOSIS — Z79899 Other long term (current) drug therapy: Secondary | ICD-10-CM | POA: Insufficient documentation

## 2015-05-21 DIAGNOSIS — Z7982 Long term (current) use of aspirin: Secondary | ICD-10-CM | POA: Diagnosis not present

## 2015-05-21 DIAGNOSIS — J449 Chronic obstructive pulmonary disease, unspecified: Secondary | ICD-10-CM | POA: Insufficient documentation

## 2015-05-21 DIAGNOSIS — Z8673 Personal history of transient ischemic attack (TIA), and cerebral infarction without residual deficits: Secondary | ICD-10-CM | POA: Diagnosis not present

## 2015-05-21 DIAGNOSIS — K760 Fatty (change of) liver, not elsewhere classified: Secondary | ICD-10-CM | POA: Diagnosis not present

## 2015-05-21 DIAGNOSIS — Z86018 Personal history of other benign neoplasm: Secondary | ICD-10-CM | POA: Diagnosis not present

## 2015-05-21 DIAGNOSIS — G8929 Other chronic pain: Secondary | ICD-10-CM | POA: Diagnosis not present

## 2015-05-21 DIAGNOSIS — Z86010 Personal history of colonic polyps: Secondary | ICD-10-CM | POA: Insufficient documentation

## 2015-05-21 DIAGNOSIS — N281 Cyst of kidney, acquired: Secondary | ICD-10-CM | POA: Diagnosis not present

## 2015-05-21 DIAGNOSIS — Z720 Tobacco use: Secondary | ICD-10-CM | POA: Insufficient documentation

## 2015-05-21 DIAGNOSIS — R109 Unspecified abdominal pain: Secondary | ICD-10-CM

## 2015-05-21 DIAGNOSIS — R1011 Right upper quadrant pain: Secondary | ICD-10-CM | POA: Insufficient documentation

## 2015-05-21 LAB — CBC WITH DIFFERENTIAL/PLATELET
BASOS ABS: 0 10*3/uL (ref 0.0–0.1)
BASOS PCT: 0 % (ref 0–1)
Eosinophils Absolute: 0.1 10*3/uL (ref 0.0–0.7)
Eosinophils Relative: 1 % (ref 0–5)
HEMATOCRIT: 44.9 % (ref 39.0–52.0)
Hemoglobin: 15.4 g/dL (ref 13.0–17.0)
LYMPHS PCT: 45 % (ref 12–46)
Lymphs Abs: 3.9 10*3/uL (ref 0.7–4.0)
MCH: 29.5 pg (ref 26.0–34.0)
MCHC: 34.3 g/dL (ref 30.0–36.0)
MCV: 86 fL (ref 78.0–100.0)
MONOS PCT: 7 % (ref 3–12)
Monocytes Absolute: 0.6 10*3/uL (ref 0.1–1.0)
Neutro Abs: 4.1 10*3/uL (ref 1.7–7.7)
Neutrophils Relative %: 47 % (ref 43–77)
PLATELETS: 214 10*3/uL (ref 150–400)
RBC: 5.22 MIL/uL (ref 4.22–5.81)
RDW: 12.3 % (ref 11.5–15.5)
WBC: 8.7 10*3/uL (ref 4.0–10.5)

## 2015-05-21 LAB — COMPREHENSIVE METABOLIC PANEL
ALBUMIN: 4.1 g/dL (ref 3.5–5.0)
ALT: 52 U/L (ref 17–63)
AST: 46 U/L — ABNORMAL HIGH (ref 15–41)
Alkaline Phosphatase: 133 U/L — ABNORMAL HIGH (ref 38–126)
Anion gap: 9 (ref 5–15)
BILIRUBIN TOTAL: 0.6 mg/dL (ref 0.3–1.2)
BUN: 12 mg/dL (ref 6–20)
CHLORIDE: 105 mmol/L (ref 101–111)
CO2: 23 mmol/L (ref 22–32)
CREATININE: 0.63 mg/dL (ref 0.61–1.24)
Calcium: 9.1 mg/dL (ref 8.9–10.3)
GFR calc Af Amer: >60 mL/min (ref >60)
Glucose, Bld: 104 mg/dL — ABNORMAL HIGH (ref 65–99)
Potassium: 4 mmol/L (ref 3.5–5.1)
Sodium: 137 mmol/L (ref 135–145)
Total Protein: 6.1 g/dL — ABNORMAL LOW (ref 6.5–8.1)

## 2015-05-21 LAB — URINALYSIS, ROUTINE W REFLEX MICROSCOPIC
BILIRUBIN URINE: NEGATIVE
Glucose, UA: NEGATIVE mg/dL
HGB URINE DIPSTICK: NEGATIVE
KETONES UR: NEGATIVE mg/dL
Leukocytes, UA: NEGATIVE
Nitrite: NEGATIVE
Protein, ur: NEGATIVE mg/dL
Specific Gravity, Urine: 1.016 (ref 1.005–1.030)
Urobilinogen, UA: 0.2 mg/dL (ref 0.0–1.0)
pH: 6 (ref 5.0–8.0)

## 2015-05-21 LAB — LIPASE, BLOOD: LIPASE: 23 U/L (ref 22–51)

## 2015-05-21 MED ORDER — ONDANSETRON HCL 4 MG/2ML IJ SOLN
4.0000 mg | Freq: Once | INTRAMUSCULAR | Status: AC
  Administered 2015-05-21: 4 mg via INTRAVENOUS
  Filled 2015-05-21: qty 2

## 2015-05-21 MED ORDER — OXYCODONE-ACETAMINOPHEN 5-325 MG PO TABS: 1.0000 | ORAL_TABLET | ORAL | Status: DC | PRN

## 2015-05-21 MED ORDER — MORPHINE SULFATE 4 MG/ML IJ SOLN
4.0000 mg | Freq: Once | INTRAMUSCULAR | Status: AC
  Administered 2015-05-21: 4 mg via INTRAVENOUS
  Filled 2015-05-21: qty 1

## 2015-05-21 MED ORDER — ONDANSETRON 4 MG PO TBDP: 4.0000 mg | ORAL_TABLET | Freq: Three times a day (TID) | ORAL | Status: DC | PRN

## 2015-05-21 NOTE — ED Notes (Signed)
Pt here for abd pain and bilateral leg pain. sts some nausea today.

## 2015-05-21 NOTE — Discharge Instructions (Signed)
Take the prescribed medication as directed to help with symptoms. Follow-up with Dr. Carlean Purl-- be sure he refers you to surgery as you discussed before. Return to the ED for new or worsening symptoms.

## 2015-05-21 NOTE — ED Notes (Signed)
Pt states he had an EGD 5/18 and the MD told him that there were polyps on his gallbladder and suggested gallbladder removal. Pt was waiting for surgical consult. Also scheduled for a colonoscopy next month.

## 2015-05-21 NOTE — ED Provider Notes (Signed)
CSN: [id number]     Arrival date & time 05/21/15  1744 History   First MD Initiated Contact with Patient 05/21/15 1857     Chief Complaint  Patient presents with  . Abdominal Pain     (Consider location/radiation/quality/duration/timing/severity/associated sxs/prior Treatment) Patient is a 54 y.o. male presenting with abdominal pain. The history is provided by the patient and medical records.  Abdominal Pain Associated symptoms: nausea     This is a 54 year old male with past medical history significant for GERD, gout, COPD, hypertension, hyperlipidemia, peripheral vascular disease, presenting to the ED for abdominal pain. Patient states over the past several months he has had issues with recurrent abdominal pain. He states he was evaluated by vascular surgery, Dr. Donnetta Hutching with concern for atherosclerotic disease in his biliary tree, however was told this was not significant and would need to FU in 6 months for repeat scan.  States he was also sent to gastroenterology, Dr. Carlean Purl, and had an EGD done 5 days ago with noted gallbladder polyps.  States he is also scheduled for a colonoscopy next month. Patient states current pain is localized to his epigastrium and right upper quadrant. States he has pain whenever he eats, independent of type of food. He reports some nausea but denies vomiting. He has no urinary symptoms and bowel movements have been normal. No fever or chills. No chest pain or shortness of breath.  VSS.  Past Medical History  Diagnosis Date  . GERD (gastroesophageal reflux disease)   . Gout   . Adenomatous colon polyp 2009  . Lymphocytic colitis 2009  . COPD (chronic obstructive pulmonary disease)   . Hypertension   . NASH (nonalcoholic steatohepatitis)   . Hyperlipidemia   . TIA (transient ischemic attack)   . Asthma   . Alcohol abuse     abstinent since 1992  . Hemorrhoids   . Chronic back pain   . Personal history of colonic polyps 07/20/2002    Diminutive adenomas  (3) 06/2002 diminutive adenoma (1) 2011   . Stroke   . Peripheral vascular disease    Past Surgical History  Procedure Laterality Date  . Lumbar spine surgery      x 2  . Nasal sinus surgery    . 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 Colonoscopy  multiple  . Band hemorrhoidectomy  2003    at sigmoidoscopy  . Tee without cardioversion  05/28/2012    Procedure: TRANSESOPHAGEAL ECHOCARDIOGRAM (TEE);  Surgeon: Lelon Perla, MD;  Location: [facility];  Service: Cardiovascular;  Laterality: N/A;   Family History  Problem Relation Age of Onset  . Liver disease Mother   . Liver cancer Mother   . Breast cancer Mother   . Cancer Mother   . Hypertension Mother   . Heart attack Mother   . Colon cancer Neg Hx   . Heart disease      multiple family members on maternal and paternal side of family  . Diabetes Sister   . Hyperlipidemia Sister   . Hypertension Sister   . Diabetes Paternal Grandmother   . Cancer Father   . Heart disease Father     before age 65  . Hyperlipidemia Father   . Hypertension Father   . Heart attack Father   . Hypertension Brother    History  Substance Use Topics  . Smoking status: Current Every Day Smoker -- 1.00 packs/day for 41 years    Types: Cigarettes  . Smokeless tobacco:  Never Used  . Alcohol Use: No    Review of Systems  Gastrointestinal: Positive for nausea and abdominal pain.  All other systems reviewed and are negative.     Allergies  Penicillins  Home Medications   Prior to Admission medications   Medication Sig Start Date End Date Taking? Authorizing Provider  allopurinol (ZYLOPRIM) 100 MG tablet Take 100 mg by mouth daily.    Historical Provider, MD  amLODipine (NORVASC) 10 MG tablet Take 10 mg by mouth daily.    Historical Provider, MD  aspirin 325 MG tablet Take 325 mg by mouth daily.    Historical Provider, MD  esomeprazole (NEXIUM) 40 MG capsule Take 40 mg by mouth 2 (two) times daily.    Historical Provider, MD   losartan (COZAAR) 100 MG tablet Take 100 mg by mouth daily.    Historical Provider, MD  metoprolol (LOPRESSOR) 100 MG tablet Take 100 mg by mouth 2 (two) times daily.    Historical Provider, MD  Milk Thistle 500 MG CAPS Take 1,000 mg by mouth daily.     Historical Provider, MD  Multiple Vitamin (MULITIVITAMIN WITH MINERALS) TABS Take 1 tablet by mouth daily.    Historical Provider, MD  naproxen (NAPROSYN) 500 MG tablet Take 500 mg by mouth daily. For pain/fever    Historical Provider, MD  nitroGLYCERIN (NITROSTAT) 0.4 MG SL tablet Place 0.4 mg under the tongue every 5 (five) minutes as needed. For chest pain    Historical Provider, MD  Omega-3 Fatty Acids (FISH OIL) 1200 MG CPDR Take 1,200 mg by mouth daily.    Historical Provider, MD  spironolactone (ALDACTONE) 100 MG tablet Take 100 mg by mouth daily.    Historical Provider, MD  traZODone (DESYREL) 50 MG tablet Take 50 mg by mouth at bedtime.    Historical Provider, MD   BP 152/85 mmHg  Pulse 63  Temp(Src) 98.3 F (36.8 C)  Resp 23  SpO2 92%   Physical Exam  Constitutional: He is oriented to person, place, and time. He appears well-developed and well-nourished. No distress.  HENT:  Head: Normocephalic and atraumatic.  Mouth/Throat: Oropharynx is clear and moist.  Eyes: Conjunctivae and EOM are normal. Pupils are equal, round, and reactive to light.  Neck: Normal range of motion. Neck supple.  Cardiovascular: Normal rate, regular rhythm and normal heart sounds.   Pulmonary/Chest: Effort normal and breath sounds normal. No respiratory distress. He has no wheezes.  Abdominal: Soft. Bowel sounds are normal. There is tenderness in the right upper quadrant and epigastric area. There is no guarding and negative Murphy's sign.  Abdomen soft, non-distended, mild tenderness in epigastrium and RUQ without rebound or guarding  Musculoskeletal: Normal range of motion.  Neurological: He is alert and oriented to person, place, and time.  Skin:  Skin is warm and dry. He is not diaphoretic.  Psychiatric: He has a normal mood and affect.  Nursing note and vitals reviewed.   ED Course  Procedures (including critical care time) Labs Review Labs Reviewed  COMPREHENSIVE METABOLIC PANEL - Abnormal; Notable for the following:    Glucose, Bld 104 (*)    Total Protein 6.1 (*)    AST 46 (*)    Alkaline Phosphatase 133 (*)    All other components within normal limits  CBC WITH DIFFERENTIAL/PLATELET  LIPASE, BLOOD  URINALYSIS, ROUTINE W REFLEX MICROSCOPIC    Imaging Review US Abdomen Complete  05/21/2015   CLINICAL DATA:  Right upper quadrant and epigastric pain. History of hypertension.  EXAM: ULTRASOUND ABDOMEN COMPLETE  COMPARISON:  CT, 03/26/2015  FINDINGS: Gallbladder: There is an echogenic focus along the gallbladder fundal wall which is nonspecific. It could reflect calcification. No gallstones. No wall thickening. No evidence acute cholecystitis.  Common bile duct: Diameter: 5.6 mm  Liver: Echogenic with poor through transmission of the sound beam. Grossly normal in size. No mass or focal lesion.  IVC: No abnormality visualized.  Pancreas: Visualized portion unremarkable.  Spleen: Size and appearance within normal limits.  Right Kidney: Length: 12.9 cm. 12 mm lower pole cyst. No other masses. No hydronephrosis.  Left Kidney: Length: 12.3 cm. 8 mm mid to upper pole cyst. No other masses. No hydronephrosis.  Abdominal aorta: No aneurysm visualized.  Other findings: None.  IMPRESSION: 1. No acute finding. No evidence of acute cholecystitis no bile duct dilation. 2. Extensive hepatic steatosis. 3. Questionable small calcification along the wall of the gallbladder fundus. There is no convincing calcification noted on the fairly recent previous CT scan, however. This may be artifact between the echogenic liver the gallbladder wall. 4. Small renal cysts.   Electronically Signed   By: Lajean Manes M.D.   On: 05/21/2015 21:02     EKG  Interpretation None      MDM   Final diagnoses:  Abdominal pain, unspecified abdominal location  Nausea   54 year old male here with abdominal pain, worse in epigastrium and right upper quadrant. He was recently evaluated by vascular surgery for atherosclerotic changes in his biliary tree, told to follow-up in 6 months. He also had an EGD done 5 days ago with GI, there were noted polyps in the gallbladder and fatty liver. Patient reports pain after eating, independent of type of food. He denies any fever or chills. Patient afebrile, nontoxic. He has mild tenderness in his epigastrium and right upper quadrant without peritoneal signs. His labwork is overall reassuring.  Ultrasound was obtained which is negative for acute findings, questionable small calcification along the wall of the gallbladder fundus.  Patient was given morphine and Zofran here in the ED with improvement of his symptoms. He has tolerated PO without difficulty.  VS remain stable.  Patient has already been referred to general surgery by GI.  Strongly encouraged FU with GI and general surgery.  Percocet and zofran given for symptomatic control.  Discussed plan with patient, he/she acknowledged understanding and agreed with plan of care.  Return precautions given for new or worsening symptoms.  Larene Pickett, PA-C 05/22/15 0000  Evelina Bucy, MD 05/22/15 [phone]

## 2015-05-21 NOTE — ED Notes (Signed)
Pt in [place]

## 2015-05-22 ENCOUNTER — Encounter: Payer: Self-pay | Admitting: Internal Medicine

## 2015-05-22 DIAGNOSIS — K2270 Barrett's esophagus without dysplasia: Secondary | ICD-10-CM

## 2015-05-22 HISTORY — DX: Barrett's esophagus without dysplasia: K22.70

## 2015-05-22 NOTE — Progress Notes (Signed)
Quick Note:  Biopsies show Barrett's esophagus - precancerous change but rare to turn into cancer  Needs recall EGD 3 yrs 04/2018 - [facility] to record recall  He has had RUQ pain, abnl LFT's and ? Stone or polyp in GB - needs referral to [facility] please to evauate these problems and findings   ______

## 2015-05-30 ENCOUNTER — Encounter (HOSPITAL_COMMUNITY): Payer: Self-pay | Admitting: Family Medicine

## 2015-05-30 ENCOUNTER — Emergency Department (HOSPITAL_COMMUNITY)
Admission: EM | Admit: 2015-05-30 | Discharge: 2015-05-30 | Disposition: A | Discharge status: Home / Self Care | Payer: Medicare Other | Attending: Emergency Medicine | Admitting: Emergency Medicine

## 2015-05-30 DIAGNOSIS — Z79899 Other long term (current) drug therapy: Secondary | ICD-10-CM | POA: Insufficient documentation

## 2015-05-30 DIAGNOSIS — R1013 Epigastric pain: Secondary | ICD-10-CM | POA: Diagnosis not present

## 2015-05-30 DIAGNOSIS — I10 Essential (primary) hypertension: Secondary | ICD-10-CM | POA: Insufficient documentation

## 2015-05-30 DIAGNOSIS — R110 Nausea: Secondary | ICD-10-CM | POA: Diagnosis not present

## 2015-05-30 DIAGNOSIS — Z86010 Personal history of colonic polyps: Secondary | ICD-10-CM | POA: Diagnosis not present

## 2015-05-30 DIAGNOSIS — K219 Gastro-esophageal reflux disease without esophagitis: Secondary | ICD-10-CM | POA: Diagnosis not present

## 2015-05-30 DIAGNOSIS — Z8673 Personal history of transient ischemic attack (TIA), and cerebral infarction without residual deficits: Secondary | ICD-10-CM | POA: Insufficient documentation

## 2015-05-30 DIAGNOSIS — E785 Hyperlipidemia, unspecified: Secondary | ICD-10-CM | POA: Insufficient documentation

## 2015-05-30 DIAGNOSIS — Z7982 Long term (current) use of aspirin: Secondary | ICD-10-CM | POA: Diagnosis not present

## 2015-05-30 DIAGNOSIS — J449 Chronic obstructive pulmonary disease, unspecified: Secondary | ICD-10-CM | POA: Insufficient documentation

## 2015-05-30 DIAGNOSIS — R1084 Generalized abdominal pain: Secondary | ICD-10-CM | POA: Insufficient documentation

## 2015-05-30 DIAGNOSIS — G8929 Other chronic pain: Secondary | ICD-10-CM | POA: Insufficient documentation

## 2015-05-30 DIAGNOSIS — Z880 Allergy status to penicillin: Secondary | ICD-10-CM | POA: Diagnosis not present

## 2015-05-30 DIAGNOSIS — M109 Gout, unspecified: Secondary | ICD-10-CM | POA: Insufficient documentation

## 2015-05-30 DIAGNOSIS — Z720 Tobacco use: Secondary | ICD-10-CM | POA: Insufficient documentation

## 2015-05-30 DIAGNOSIS — R1011 Right upper quadrant pain: Secondary | ICD-10-CM | POA: Diagnosis present

## 2015-05-30 LAB — COMPREHENSIVE METABOLIC PANEL
ALT: 49 U/L (ref 17–63)
ANION GAP: 9 (ref 5–15)
AST: 36 U/L (ref 15–41)
Albumin: 4.3 g/dL (ref 3.5–5.0)
Alkaline Phosphatase: 131 U/L — ABNORMAL HIGH (ref 38–126)
BUN: 15 mg/dL (ref 6–20)
CO2: 24 mmol/L (ref 22–32)
Calcium: 9.7 mg/dL (ref 8.9–10.3)
Chloride: 104 mmol/L (ref 101–111)
Creatinine, Ser: 0.78 mg/dL (ref 0.61–1.24)
GFR calc Af Amer: >60 mL/min (ref >60)
GFR calc non Af Amer: >60 mL/min (ref >60)
Glucose, Bld: 99 mg/dL (ref 65–99)
Potassium: 4.5 mmol/L (ref 3.5–5.1)
Sodium: 137 mmol/L (ref 135–145)
Total Bilirubin: 0.7 mg/dL (ref 0.3–1.2)
Total Protein: 6.8 g/dL (ref 6.5–8.1)

## 2015-05-30 LAB — CBC WITH DIFFERENTIAL/PLATELET
BASOS ABS: 0 10*3/uL (ref 0.0–0.1)
BASOS PCT: 0 % (ref 0–1)
EOS PCT: 2 % (ref 0–5)
Eosinophils Absolute: 0.1 10*3/uL (ref 0.0–0.7)
HCT: 45.4 % (ref 39.0–52.0)
Hemoglobin: 15.7 g/dL (ref 13.0–17.0)
LYMPHS ABS: 3.5 10*3/uL (ref 0.7–4.0)
Lymphocytes Relative: 43 % (ref 12–46)
MCH: 29.7 pg (ref 26.0–34.0)
MCHC: 34.6 g/dL (ref 30.0–36.0)
MCV: 85.8 fL (ref 78.0–100.0)
MONO ABS: 0.6 10*3/uL (ref 0.1–1.0)
MONOS PCT: 7 % (ref 3–12)
Neutro Abs: 3.9 10*3/uL (ref 1.7–7.7)
Neutrophils Relative %: 48 % (ref 43–77)
Platelets: 194 10*3/uL (ref 150–400)
RBC: 5.29 MIL/uL (ref 4.22–5.81)
RDW: 12.4 % (ref 11.5–15.5)
WBC: 8.1 10*3/uL (ref 4.0–10.5)

## 2015-05-30 LAB — LIPASE, BLOOD: LIPASE: 19 U/L — AB (ref 22–51)

## 2015-05-30 MED ORDER — SODIUM CHLORIDE 0.9 % IV BOLUS (SEPSIS)
500.0000 mL | Freq: Once | INTRAVENOUS | Status: AC
  Administered 2015-05-30: 500 mL via INTRAVENOUS

## 2015-05-30 MED ORDER — OXYCODONE-ACETAMINOPHEN 5-325 MG PO TABS: 1.0000 | ORAL_TABLET | ORAL | Status: DC | PRN

## 2015-05-30 MED ORDER — HYDROMORPHONE HCL 1 MG/ML IJ SOLN
1.0000 mg | Freq: Once | INTRAMUSCULAR | Status: AC
  Administered 2015-05-30: 1 mg via INTRAVENOUS
  Filled 2015-05-30: qty 1

## 2015-05-30 NOTE — Discharge Instructions (Signed)
As discussed, your evaluation today has been largely reassuring.  But, it is important that you monitor your condition carefully, and do not hesitate to return to the ED if you develop new, or concerning changes in your condition. ? ?Otherwise, please follow-up with your physician for appropriate ongoing care. ? ?

## 2015-05-30 NOTE — ED Notes (Signed)
Pt here for continued pain from gallbladder. sts scheduled next week to see the surgeon. sts percocet eases the pain but it comes right back. sts some nausea and unable to eat

## 2015-05-30 NOTE — ED Provider Notes (Signed)
CSN: [id number]     Arrival date & time 05/30/15  5784 History   First MD Initiated Contact with Patient 05/30/15 [phone]     Chief Complaint  Patient presents with  . Abdominal Pain     (Consider location/radiation/quality/duration/timing/severity/associated sxs/prior Treatment) HPI Patient presents one week after being evaluated here for abdominal pain, now with concern for ongoing pain. He states that the pain is essentially the same, focally in the epigastrium, right upper quadrant, worse with oral intake However, the patient seems to have had increased pain over last 2 days. Confounding issues the patient has been unwilling to take his prescribed pain medication over this timeframe. The pain is sharp, severe, otherwise nonradiating. There is mild associated nausea, but no vomiting, diarrhea. Patient also has generalized abdominal discomfort, though this is chronic. Patient dates that he is a recovering alcoholic, does not medication that he feels he is susceptible to becoming addicted to. No current fever, chills. Patient spoke with his surgeon's office today, was referred here due to increased pain.  Past Medical History  Diagnosis Date  . GERD (gastroesophageal reflux disease)   . Gout   . Adenomatous colon polyp 2009  . Lymphocytic colitis 2009  . COPD (chronic obstructive pulmonary disease)   . Hypertension   . NASH (nonalcoholic steatohepatitis)   . Hyperlipidemia   . TIA (transient ischemic attack)   . Asthma   . Alcohol abuse     abstinent since 1992  . Hemorrhoids   . Chronic back pain   . Personal history of colonic polyps 07/20/2002    Diminutive adenomas (3) 06/2002 diminutive adenoma (1) 2011   . Stroke   . Peripheral vascular disease   . Barrett's esophagus 05/22/2015   Past Surgical History  Procedure Laterality Date  . Lumbar spine surgery      x 2  . Nasal sinus surgery    . 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 Colonoscopy  multiple  . Band  hemorrhoidectomy  2003    at sigmoidoscopy  . Tee without cardioversion  05/28/2012    Procedure: TRANSESOPHAGEAL ECHOCARDIOGRAM (TEE);  Surgeon: Lelon Perla, MD;  Location: [facility] ENDOSCOPY;  Service: Cardiovascular;  Laterality: N/A;   Family History  Problem Relation Age of Onset  . Liver disease Mother   . Liver cancer Mother   . Breast cancer Mother   . Cancer Mother   . Hypertension Mother   . Heart attack Mother   . Colon cancer Neg Hx   . Heart disease      multiple family members on maternal and paternal side of family  . Diabetes Sister   . Hyperlipidemia Sister   . Hypertension Sister   . Diabetes Paternal Grandmother   . Cancer Father   . Heart disease Father     before age 35  . Hyperlipidemia Father   . Hypertension Father   . Heart attack Father   . Hypertension Brother    History  Substance Use Topics  . Smoking status: Current Every Day Smoker -- 1.00 packs/day for 41 years    Types: Cigarettes  . Smokeless tobacco: Never Used  . Alcohol Use: No    Review of Systems  Constitutional:       Per HPI, otherwise negative  HENT:       Per HPI, otherwise negative  Respiratory:       Per HPI, otherwise negative  Cardiovascular:       Per HPI, otherwise  negative  Gastrointestinal: Negative for vomiting.  Endocrine:       Negative aside from HPI  Genitourinary:       Neg aside from HPI   Musculoskeletal:       Per HPI, otherwise negative  Skin: Negative.   Neurological: Negative for syncope.      Allergies  Penicillins  Home Medications   Prior to Admission medications   Medication Sig Start Date End Date Taking? Authorizing Provider  allopurinol (ZYLOPRIM) 100 MG tablet Take 100 mg by mouth daily.    Historical Provider, MD  amLODipine (NORVASC) 10 MG tablet Take 10 mg by mouth daily.    Historical Provider, MD  aspirin 325 MG tablet Take 325 mg by mouth daily.    Historical Provider, MD  esomeprazole (NEXIUM) 40 MG capsule Take 40 mg by  mouth 2 (two) times daily.    Historical Provider, MD  losartan (COZAAR) 100 MG tablet Take 100 mg by mouth daily.    Historical Provider, MD  metoprolol (LOPRESSOR) 100 MG tablet Take 100 mg by mouth 2 (two) times daily.    Historical Provider, MD  Milk Thistle 500 MG CAPS Take 1,000 mg by mouth daily.     Historical Provider, MD  Multiple Vitamin (MULITIVITAMIN WITH MINERALS) TABS Take 1 tablet by mouth daily.    Historical Provider, MD  naproxen (NAPROSYN) 500 MG tablet Take 500 mg by mouth daily. For pain/fever    Historical Provider, MD  nitroGLYCERIN (NITROSTAT) 0.4 MG SL tablet Place 0.4 mg under the tongue every 5 (five) minutes as needed. For chest pain    Historical Provider, MD  Omega-3 Fatty Acids (FISH OIL) 1200 MG CPDR Take 1,200 mg by mouth daily.    Historical Provider, MD  ondansetron (ZOFRAN ODT) 4 MG disintegrating tablet Take 1 tablet (4 mg total) by mouth every 8 (eight) hours as needed for nausea. 05/21/15   Larene Pickett, PA-C  oxyCODONE-acetaminophen (PERCOCET/ROXICET) 5-325 MG per tablet Take 1 tablet by mouth every 4 (four) hours as needed. 05/30/15   Carmin Muskrat, MD  spironolactone (ALDACTONE) 100 MG tablet Take 100 mg by mouth daily.    Historical Provider, MD  traZODone (DESYREL) 50 MG tablet Take 50 mg by mouth at bedtime.    Historical Provider, MD   BP 147/90 mmHg  Pulse 61  Temp(Src) 98.8 F (37.1 C) (Oral)  Resp 17  SpO2 95% Physical Exam  Constitutional: He is oriented to person, place, and time. He appears well-developed. No distress.  HENT:  Head: Normocephalic and atraumatic.  Eyes: Conjunctivae and EOM are normal.  Cardiovascular: Normal rate and regular rhythm.   Pulmonary/Chest: Effort normal. No stridor. No respiratory distress.  Abdominal: He exhibits no distension. There is tenderness in the right upper quadrant and epigastric area.  Musculoskeletal: He exhibits no edema.  Neurological: He is alert and oriented to person, place, and time.   Skin: Skin is warm and dry.  Psychiatric: He has a normal mood and affect.  Nursing note and vitals reviewed.   ED Course  Procedures (including critical care time) Labs Review Labs Reviewed  COMPREHENSIVE METABOLIC PANEL - Abnormal; Notable for the following:    Alkaline Phosphatase 131 (*)    All other components within normal limits  LIPASE, BLOOD - Abnormal; Notable for the following:    Lipase 19 (*)    All other components within normal limits  CBC WITH DIFFERENTIAL/PLATELET    Imaging Review No results found.   EKG Interpretation  None     Chart review demonstrates multiple visits for abdominal pain, ultrasound last week with no acute cholecystitis, recent CT imaging, without acute findings. Patient has recent endoscopy as well.  Biopsy results showed Barrett's esophagus.  I reviewed the results (including imaging as performed), agree with the interpretation  On repeat exam the patient appears better.  We reviewed all findings.   MDM   Final diagnoses:  Generalized abdominal pain   patient with multiple medical issues including cirrhosis, presents with concern of ongoing abdominal pain, though the patient has not been taking the recently prescribed medication as directed. Here the patient is a soft, non-peritoneal abdomen, no evidence for sepsis, bacteremia from a neurovascular compromise. Patient has relationships with both general surgery and gastrin neurology, each of whom is appropriate for additional follow-up care. Patient was advised on the need for appropriate medication use, including analgesia 6, discharged in stable condition.  Carmin Muskrat, MD 05/30/15 1131

## 2015-06-06 ENCOUNTER — Other Ambulatory Visit: Payer: Self-pay | Admitting: General Surgery

## 2015-06-06 DIAGNOSIS — K7581 Nonalcoholic steatohepatitis (NASH): Secondary | ICD-10-CM | POA: Diagnosis not present

## 2015-06-06 DIAGNOSIS — Z720 Tobacco use: Secondary | ICD-10-CM | POA: Diagnosis not present

## 2015-06-06 DIAGNOSIS — R109 Unspecified abdominal pain: Secondary | ICD-10-CM | POA: Diagnosis not present

## 2015-06-06 DIAGNOSIS — J449 Chronic obstructive pulmonary disease, unspecified: Secondary | ICD-10-CM | POA: Diagnosis not present

## 2015-06-06 DIAGNOSIS — K5289 Other specified noninfective gastroenteritis and colitis: Secondary | ICD-10-CM | POA: Diagnosis not present

## 2015-06-06 DIAGNOSIS — K2270 Barrett's esophagus without dysplasia: Secondary | ICD-10-CM | POA: Diagnosis not present

## 2015-06-06 DIAGNOSIS — I10 Essential (primary) hypertension: Secondary | ICD-10-CM | POA: Diagnosis not present

## 2015-06-06 DIAGNOSIS — Z6834 Body mass index (BMI) 34.0-34.9, adult: Secondary | ICD-10-CM | POA: Diagnosis not present

## 2015-06-06 DIAGNOSIS — Z87898 Personal history of other specified conditions: Secondary | ICD-10-CM | POA: Diagnosis not present

## 2015-06-06 DIAGNOSIS — R1011 Right upper quadrant pain: Secondary | ICD-10-CM

## 2015-06-12 DIAGNOSIS — E785 Hyperlipidemia, unspecified: Secondary | ICD-10-CM | POA: Diagnosis not present

## 2015-06-12 DIAGNOSIS — M549 Dorsalgia, unspecified: Secondary | ICD-10-CM | POA: Diagnosis not present

## 2015-06-12 DIAGNOSIS — Z1389 Encounter for screening for other disorder: Secondary | ICD-10-CM | POA: Diagnosis not present

## 2015-06-12 DIAGNOSIS — M109 Gout, unspecified: Secondary | ICD-10-CM | POA: Diagnosis not present

## 2015-06-12 DIAGNOSIS — I2510 Atherosclerotic heart disease of native coronary artery without angina pectoris: Secondary | ICD-10-CM | POA: Diagnosis not present

## 2015-06-12 DIAGNOSIS — G479 Sleep disorder, unspecified: Secondary | ICD-10-CM | POA: Diagnosis not present

## 2015-06-12 DIAGNOSIS — K219 Gastro-esophageal reflux disease without esophagitis: Secondary | ICD-10-CM | POA: Diagnosis not present

## 2015-06-12 DIAGNOSIS — I10 Essential (primary) hypertension: Secondary | ICD-10-CM | POA: Diagnosis not present

## 2015-06-14 ENCOUNTER — Ambulatory Visit (AMBULATORY_SURGERY_CENTER): Payer: Medicare Other | Admitting: Internal Medicine

## 2015-06-14 ENCOUNTER — Encounter: Payer: Self-pay | Admitting: Internal Medicine

## 2015-06-14 VITALS — BP 115/69 | HR 64 | Temp 97.2°F | Resp 18 | Ht 69.0 in | Wt 228.0 lb

## 2015-06-14 DIAGNOSIS — D125 Benign neoplasm of sigmoid colon: Secondary | ICD-10-CM | POA: Diagnosis not present

## 2015-06-14 DIAGNOSIS — R1011 Right upper quadrant pain: Secondary | ICD-10-CM | POA: Diagnosis not present

## 2015-06-14 DIAGNOSIS — I2510 Atherosclerotic heart disease of native coronary artery without angina pectoris: Secondary | ICD-10-CM | POA: Diagnosis not present

## 2015-06-14 DIAGNOSIS — E669 Obesity, unspecified: Secondary | ICD-10-CM | POA: Diagnosis not present

## 2015-06-14 DIAGNOSIS — I10 Essential (primary) hypertension: Secondary | ICD-10-CM | POA: Diagnosis not present

## 2015-06-14 DIAGNOSIS — D124 Benign neoplasm of descending colon: Secondary | ICD-10-CM

## 2015-06-14 DIAGNOSIS — Z86010 Personal history of colonic polyps: Secondary | ICD-10-CM | POA: Diagnosis not present

## 2015-06-14 DIAGNOSIS — J449 Chronic obstructive pulmonary disease, unspecified: Secondary | ICD-10-CM | POA: Diagnosis not present

## 2015-06-14 DIAGNOSIS — K7460 Unspecified cirrhosis of liver: Secondary | ICD-10-CM | POA: Diagnosis not present

## 2015-06-14 MED ORDER — SODIUM CHLORIDE 0.9 % IV SOLN: 500.0000 mL | INTRAVENOUS | Status: DC

## 2015-06-14 NOTE — Progress Notes (Signed)
Called to room to assist during endoscopic procedure.  Patient ID and intended procedure confirmed with present staff. Received instructions for my participation in the procedure from the performing physician.  

## 2015-06-14 NOTE — Progress Notes (Signed)
To recovery, report to Nyra Capes, RN, VSS.

## 2015-06-14 NOTE — Op Note (Signed)
[place]  [address] [place], [id number]   COLONOSCOPY PROCEDURE REPORT  PATIENT: [name] [name], [name] [name]  MR#: [id number] BIRTHDATE: 11/05/1961 , 32  yrs. old GENDER: male ENDOSCOPIST: Gatha Mayer, MD, [facility] PROCEDURE DATE:  06/14/2015 PROCEDURE:   Colonoscopy, surveillance and Colonoscopy with biopsy First Screening Colonoscopy - Avg.  risk and is 50 yrs.  old or older - No.  Prior Negative Screening - Now for repeat screening. N/A  History of Adenoma - Now for follow-up colonoscopy & has been > or = to 3 yrs.  Yes hx of adenoma.  Has been 3 or more years since last colonoscopy.  Polyps removed today? Yes ASA CLASS:   Class III INDICATIONS:Surveillance due to prior colonic neoplasia and PH Colon Adenoma. MEDICATIONS: Propofol 280 mg IV  DESCRIPTION OF PROCEDURE:   After the risks benefits and alternatives of the procedure were thoroughly explained, informed consent was obtained.  The digital rectal exam revealed no abnormalities of the rectum.   The [id number]  endoscope was introduced through the anus and advanced to the cecum, which was identified by both the appendix and ileocecal valve. No adverse events experienced.   The quality of the prep was good.  (MiraLax was used)  The instrument was then slowly withdrawn as the colon was fully examined. Estimated blood loss is zero unless otherwise noted in this procedure report.      COLON FINDINGS: Two sessile polyps ranging from 2 to 29m in size were found in the sigmoid colon and descending colon. Polypectomies were performed with cold forceps.  The resection was complete, the polyp tissue was completely retrieved and sent to histology.   The examination was otherwise normal.  Retroflexed views revealed no abnormalities. The time to cecum = 1.1 Withdrawal time = 7.8   The scope was withdrawn and the procedure completed. COMPLICATIONS: There were no immediate complications.  ENDOSCOPIC  IMPRESSION: 1.   Two sessile polyps ranging from 2 to 327min size were found in the sigmoid colon and descending colon; polypectomies were performed with cold forceps 2.   The examination was otherwise normal - hx adenomas last in 2011  RECOMMENDATIONS: Timing of repeat colonoscopy will be determined by pathology findings.  eSigned:  CaGatha MayerMD, FAPocono Ambulatory Surgery Center Ltd6/16/2016 4:14 PM   cc: The Patient, RaSamara SnideMD

## 2015-06-14 NOTE — Patient Instructions (Addendum)
I found and removed 2 tiny polyps. I will let you know pathology results and when to have another routine colonoscopy by mail.  Good luck with the gallbladder.  I appreciate the opportunity to care for you. Gatha Mayer, MD, FACG   YOU HAD AN ENDOSCOPIC PROCEDURE TODAY AT [facility]:   Refer to the procedure report that was given to you for any specific questions about what was found during the examination.  If the procedure report does not answer your questions, please call your gastroenterologist to clarify.  If you requested that your care partner not be given the details of your procedure findings, then the procedure report has been included in a sealed envelope for you to review at your convenience later.  YOU SHOULD EXPECT: Some feelings of bloating in the abdomen. Passage of more gas than usual.  Walking can help get rid of the air that was put into your GI tract during the procedure and reduce the bloating. If you had a lower endoscopy (such as a colonoscopy or flexible sigmoidoscopy) you may notice spotting of blood in your stool or on the toilet paper. If you underwent a bowel prep for your procedure, you may not have a normal bowel movement for a few days.  Please Note:  You might notice some irritation and congestion in your nose or some drainage.  This is from the oxygen used during your procedure.  There is no need for concern and it should clear up in a day or so.  SYMPTOMS TO REPORT IMMEDIATELY:   Following lower endoscopy (colonoscopy or flexible sigmoidoscopy):  Excessive amounts of blood in the stool  Significant tenderness or worsening of abdominal pains  Swelling of the abdomen that is new, acute  Fever of 100F or higher   For urgent or emergent issues, a gastroenterologist can be reached at any hour by calling [phone].   DIET: Your first meal following the procedure should be a small meal and then it is ok to progress to your  normal diet. Heavy or fried foods are harder to digest and may make you feel nauseous or bloated.  Likewise, meals heavy in dairy and vegetables can increase bloating.  Drink plenty of fluids but you should avoid alcoholic beverages for 24 hours.  ACTIVITY:  You should plan to take it easy for the rest of today and you should NOT DRIVE or use heavy machinery until tomorrow (because of the sedation medicines used during the test).    FOLLOW UP: Our staff will call the number listed on your records the next business day following your procedure to check on you and address any questions or concerns that you may have regarding the information given to you following your procedure. If we do not reach you, we will leave a message.  However, if you are feeling well and you are not experiencing any problems, there is no need to return our call.  We will assume that you have returned to your regular daily activities without incident.  If any biopsies were taken you will be contacted by phone or by letter within the next 1-3 weeks.  Please call us at [phone] if you have not heard about the biopsies in 3 weeks.    SIGNATURES/CONFIDENTIALITY: You and/or your care partner have signed paperwork which will be entered into your electronic medical record.  These signatures attest to the fact that that the information above on your After Visit Summary has  been reviewed and is understood.  Full responsibility of the confidentiality of this discharge information lies with you and/or your care-partner.  Please, read all of the handouts given to you by your recovery room nurse.

## 2015-06-18 ENCOUNTER — Encounter (HOSPITAL_COMMUNITY): Payer: Self-pay | Admitting: *Deleted

## 2015-06-18 ENCOUNTER — Telehealth: Payer: Self-pay | Admitting: *Deleted

## 2015-06-18 ENCOUNTER — Emergency Department (HOSPITAL_COMMUNITY)
Admission: EM | Admit: 2015-06-18 | Discharge: 2015-06-18 | Disposition: A | Discharge status: Home / Self Care | Payer: Medicare Other | Attending: Emergency Medicine | Admitting: Emergency Medicine

## 2015-06-18 DIAGNOSIS — R1011 Right upper quadrant pain: Secondary | ICD-10-CM | POA: Insufficient documentation

## 2015-06-18 DIAGNOSIS — G8929 Other chronic pain: Secondary | ICD-10-CM | POA: Diagnosis not present

## 2015-06-18 DIAGNOSIS — Z720 Tobacco use: Secondary | ICD-10-CM | POA: Insufficient documentation

## 2015-06-18 DIAGNOSIS — Z8673 Personal history of transient ischemic attack (TIA), and cerebral infarction without residual deficits: Secondary | ICD-10-CM | POA: Insufficient documentation

## 2015-06-18 DIAGNOSIS — Z7982 Long term (current) use of aspirin: Secondary | ICD-10-CM | POA: Diagnosis not present

## 2015-06-18 DIAGNOSIS — K219 Gastro-esophageal reflux disease without esophagitis: Secondary | ICD-10-CM | POA: Insufficient documentation

## 2015-06-18 DIAGNOSIS — Z79899 Other long term (current) drug therapy: Secondary | ICD-10-CM | POA: Diagnosis not present

## 2015-06-18 DIAGNOSIS — Z86010 Personal history of colonic polyps: Secondary | ICD-10-CM | POA: Diagnosis not present

## 2015-06-18 DIAGNOSIS — J449 Chronic obstructive pulmonary disease, unspecified: Secondary | ICD-10-CM | POA: Diagnosis not present

## 2015-06-18 DIAGNOSIS — Z880 Allergy status to penicillin: Secondary | ICD-10-CM | POA: Insufficient documentation

## 2015-06-18 DIAGNOSIS — M109 Gout, unspecified: Secondary | ICD-10-CM | POA: Diagnosis not present

## 2015-06-18 DIAGNOSIS — I10 Essential (primary) hypertension: Secondary | ICD-10-CM | POA: Diagnosis not present

## 2015-06-18 LAB — COMPREHENSIVE METABOLIC PANEL
ALT: 45 U/L (ref 17–63)
AST: 34 U/L (ref 15–41)
Albumin: 4.1 g/dL (ref 3.5–5.0)
Alkaline Phosphatase: 125 U/L (ref 38–126)
Anion gap: 9 (ref 5–15)
BILIRUBIN TOTAL: 0.6 mg/dL (ref 0.3–1.2)
BUN: 14 mg/dL (ref 6–20)
CALCIUM: 9.5 mg/dL (ref 8.9–10.3)
CHLORIDE: 106 mmol/L (ref 101–111)
CO2: 24 mmol/L (ref 22–32)
Creatinine, Ser: 0.8 mg/dL (ref 0.61–1.24)
GFR calc Af Amer: >60 mL/min (ref >60)
Glucose, Bld: 102 mg/dL — ABNORMAL HIGH (ref 65–99)
Potassium: 3.9 mmol/L (ref 3.5–5.1)
SODIUM: 139 mmol/L (ref 135–145)
Total Protein: 6.6 g/dL (ref 6.5–8.1)

## 2015-06-18 LAB — CBC WITH DIFFERENTIAL/PLATELET
BASOS PCT: 0 % (ref 0–1)
Basophils Absolute: 0 10*3/uL (ref 0.0–0.1)
EOS PCT: 2 % (ref 0–5)
Eosinophils Absolute: 0.1 10*3/uL (ref 0.0–0.7)
HCT: 45.4 % (ref 39.0–52.0)
Hemoglobin: 15.8 g/dL (ref 13.0–17.0)
Lymphocytes Relative: 45 % (ref 12–46)
Lymphs Abs: 3.6 10*3/uL (ref 0.7–4.0)
MCH: 29.7 pg (ref 26.0–34.0)
MCHC: 34.8 g/dL (ref 30.0–36.0)
MCV: 85.3 fL (ref 78.0–100.0)
MONO ABS: 0.7 10*3/uL (ref 0.1–1.0)
Monocytes Relative: 8 % (ref 3–12)
NEUTROS ABS: 3.6 10*3/uL (ref 1.7–7.7)
Neutrophils Relative %: 45 % (ref 43–77)
Platelets: 194 10*3/uL (ref 150–400)
RBC: 5.32 MIL/uL (ref 4.22–5.81)
RDW: 12.2 % (ref 11.5–15.5)
WBC: 7.9 10*3/uL (ref 4.0–10.5)

## 2015-06-18 LAB — LIPASE, BLOOD: Lipase: 25 U/L (ref 22–51)

## 2015-06-18 MED ORDER — ACETAMINOPHEN 500 MG PO TABS
1000.0000 mg | ORAL_TABLET | Freq: Once | ORAL | Status: AC
  Administered 2015-06-18: 1000 mg via ORAL
  Filled 2015-06-18: qty 2

## 2015-06-18 NOTE — Telephone Encounter (Signed)
  Follow up Call-  Call back number 06/14/2015 05/16/2015  Post procedure Call Back phone  # [phone]  Permission to leave phone message Yes Yes     Patient questions:  Do you have a fever, pain , or abdominal swelling? No. Pain Score  0 *  Have you tolerated food without any problems? Yes.    Have you been able to return to your normal activities? Yes.    Do you have any questions about your discharge instructions: Diet   No. Medications  No. Follow up visit  No.  Do you have questions or concerns about your Care? No.  Actions: * If pain score is 4 or above: No action needed, pain <4.

## 2015-06-18 NOTE — ED Provider Notes (Signed)
CSN: [id number]     Arrival date & time 06/18/15  1644 History   First MD Initiated Contact with Patient 06/18/15 1807     Chief Complaint  Patient presents with  . Abdominal Pain     (Consider location/radiation/quality/duration/timing/severity/associated sxs/prior Treatment) Patient is a 54 y.o. male presenting with abdominal pain. The history is provided by the patient.  Abdominal Pain Pain location:  RUQ Pain quality: pressure and squeezing   Pain radiates to:  Back Pain severity:  Moderate Onset quality:  Gradual Duration: 3 months. Timing:  Constant Progression:  Unchanged Chronicity:  Chronic Context: not alcohol use   Relieved by:  Nothing Worsened by:  Nothing tried Ineffective treatments:  None tried Associated symptoms: nausea   Associated symptoms: no fever and no vomiting   Risk factors: no alcohol abuse and no NSAID use     Past Medical History  Diagnosis Date  . GERD (gastroesophageal reflux disease)   . Gout   . Adenomatous colon polyp 2009  . Lymphocytic colitis 2009  . COPD (chronic obstructive pulmonary disease)   . Hypertension   . NASH (nonalcoholic steatohepatitis)   . Hyperlipidemia   . TIA (transient ischemic attack)   . Asthma   . Alcohol abuse     abstinent since 1992  . Hemorrhoids   . Chronic back pain   . Personal history of colonic polyps 07/20/2002    Diminutive adenomas (3) 06/2002 diminutive adenoma (1) 2011   . Stroke   . Peripheral vascular disease   . Barrett's esophagus 05/22/2015   Past Surgical History  Procedure Laterality Date  . Lumbar spine surgery      x 2  . Nasal sinus surgery    . 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 Colonoscopy  multiple  . Band hemorrhoidectomy  2003    at sigmoidoscopy  . Tee without cardioversion  05/28/2012    Procedure: TRANSESOPHAGEAL ECHOCARDIOGRAM (TEE);  Surgeon: Lelon Perla, MD;  Location: [facility] ENDOSCOPY;  Service: Cardiovascular;  Laterality: N/A;   Family History  Problem  Relation Age of Onset  . Liver disease Mother   . Liver cancer Mother   . Breast cancer Mother   . Cancer Mother   . Hypertension Mother   . Heart attack Mother   . Colon cancer Neg Hx   . Heart disease      multiple family members on maternal and paternal side of family  . Diabetes Sister   . Hyperlipidemia Sister   . Hypertension Sister   . Diabetes Paternal Grandmother   . Cancer Father   . Heart disease Father     before age 102  . Hyperlipidemia Father   . Hypertension Father   . Heart attack Father   . Hypertension Brother    History  Substance Use Topics  . Smoking status: Current Every Day Smoker -- 1.00 packs/day for 41 years    Types: Cigarettes  . Smokeless tobacco: Never Used  . Alcohol Use: No    Review of Systems  Constitutional: Negative for fever.  Gastrointestinal: Positive for nausea and abdominal pain. Negative for vomiting.  All other systems reviewed and are negative.     Allergies  Penicillins  Home Medications   Prior to Admission medications   Medication Sig Start Date End Date Taking? Authorizing Provider  allopurinol (ZYLOPRIM) 100 MG tablet Take 100 mg by mouth daily.   Yes Historical Provider, MD  amLODipine (NORVASC) 10 MG tablet Take 10  mg by mouth daily.   Yes Historical Provider, MD  aspirin 325 MG tablet Take 325 mg by mouth daily.   Yes Historical Provider, MD  esomeprazole (NEXIUM) 40 MG capsule Take 40 mg by mouth 2 (two) times daily.   Yes Historical Provider, MD  losartan (COZAAR) 100 MG tablet Take 100 mg by mouth daily.   Yes Historical Provider, MD  metoprolol (LOPRESSOR) 100 MG tablet Take 100 mg by mouth 2 (two) times daily.   Yes Historical Provider, MD  Milk Thistle 500 MG CAPS Take 1,000 mg by mouth daily.    Yes Historical Provider, MD  Multiple Vitamin (MULITIVITAMIN WITH MINERALS) TABS Take 1 tablet by mouth daily.   Yes Historical Provider, MD  nitroGLYCERIN (NITROSTAT) 0.4 MG SL tablet Place 0.4 mg under the tongue  every 5 (five) minutes as needed. For chest pain   Yes Historical Provider, MD  Omega-3 Fatty Acids (FISH OIL) 1200 MG CPDR Take 1,200 mg by mouth daily.   Yes Historical Provider, MD  ondansetron (ZOFRAN ODT) 4 MG disintegrating tablet Take 1 tablet (4 mg total) by mouth every 8 (eight) hours as needed for nausea. 05/21/15  Yes Larene Pickett, PA-C  oxyCODONE-acetaminophen (PERCOCET/ROXICET) 5-325 MG per tablet Take 1 tablet by mouth every 4 (four) hours as needed. 05/30/15  Yes Carmin Muskrat, MD  traZODone (DESYREL) 50 MG tablet Take 50 mg by mouth at bedtime.   Yes Historical Provider, MD   BP 136/77 mmHg  Pulse 72  Temp(Src) 98.5 F (36.9 C) (Oral)  Resp 20  Ht 5' 6"  (1.676 m)  Wt 229 lb (103.874 kg)  BMI 36.98 kg/m2  SpO2 94% Physical Exam  Constitutional: He is oriented to person, place, and time. He appears well-developed and well-nourished. No distress.  HENT:  Head: Normocephalic and atraumatic.  Eyes: Conjunctivae are normal.  Neck: Neck supple. No tracheal deviation present.  Cardiovascular: Normal rate and regular rhythm.   Pulmonary/Chest: Effort normal. No respiratory distress.  Abdominal: Soft. Normal appearance. He exhibits no distension. There is tenderness (mild) in the right upper quadrant. There is no rigidity, no guarding, no tenderness at McBurney's point and negative Murphy's sign.  Neurological: He is alert and oriented to person, place, and time.  Skin: Skin is warm and dry.  Psychiatric: He has a normal mood and affect.    ED Course  Procedures (including critical care time) Labs Review Labs Reviewed  COMPREHENSIVE METABOLIC PANEL - Abnormal; Notable for the following:    Glucose, Bld 102 (*)    All other components within normal limits  CBC WITH DIFFERENTIAL/PLATELET  LIPASE, BLOOD    Imaging Review No results found.   EKG Interpretation None      MDM   Final diagnoses:  RUQ abdominal pain    54 year old male presents with chronic  abdominal pain in his right upper quadrant for what he states is the past 3 months. He is otherwise well-appearing, his screening labs are all normal, he has multiple imaging studies that were performed are all negative. He is following with a surgeon in the community who has him scheduled for a HIDA scan in 2 days with a plan to follow up on the results. At this point I feel that this is appropriate for continued outpatient management as he has no signs of acute cholecystitis, choledocholithiasis, or ascending cholangitis. Recommended Tylenol for ongoing pain symptoms  Plan to follow up with PCP as needed and return precautions discussed for worsening or new concerning  symptoms.     Leo Grosser, MD 06/18/15 1919  Wandra Arthurs, MD 06/19/15 2219

## 2015-06-18 NOTE — Discharge Instructions (Signed)

## 2015-06-18 NOTE — ED Notes (Signed)
Pt state that he has had RUQ pain for over 1 month. Pt states that the has an appt with [facility] for his gallbladder in 4 weeks. Pt states that he cannot take the pain until then.

## 2015-06-20 ENCOUNTER — Ambulatory Visit (HOSPITAL_COMMUNITY)
Admission: RE | Admit: 2015-06-20 | Discharge: 2015-06-20 | Disposition: A | Discharge status: Home / Self Care | Payer: Medicare Other | Source: Ambulatory Visit | Attending: General Surgery | Admitting: General Surgery

## 2015-06-20 DIAGNOSIS — R1011 Right upper quadrant pain: Secondary | ICD-10-CM | POA: Diagnosis present

## 2015-06-20 MED ORDER — SINCALIDE 5 MCG IJ SOLR
INTRAMUSCULAR | Status: AC
  Filled 2015-06-20: qty 5

## 2015-06-20 MED ORDER — STERILE WATER FOR INJECTION IJ SOLN
INTRAMUSCULAR | Status: AC
  Filled 2015-06-20: qty 10

## 2015-06-20 MED ORDER — TECHNETIUM TC 99M MEBROFENIN IV KIT
5.0000 | PACK | Freq: Once | INTRAVENOUS | Status: AC | PRN
  Administered 2015-06-20: 5 via INTRAVENOUS

## 2015-06-20 MED ORDER — SINCALIDE 5 MCG IJ SOLR
0.0200 ug/kg | Freq: Once | INTRAMUSCULAR | Status: AC
Start: 2015-06-20 — End: 2015-06-20
  Administered 2015-06-20: 2.1 ug via INTRAVENOUS

## 2015-06-25 ENCOUNTER — Encounter: Payer: Self-pay | Admitting: Internal Medicine

## 2015-06-25 DIAGNOSIS — Z86010 Personal history of colonic polyps: Secondary | ICD-10-CM

## 2015-06-25 NOTE — Progress Notes (Signed)
Quick Note:  2 adenomas Repeat colonoscopy 2021 ______

## 2015-07-10 ENCOUNTER — Other Ambulatory Visit: Payer: Self-pay | Admitting: General Surgery

## 2015-07-10 DIAGNOSIS — Z6834 Body mass index (BMI) 34.0-34.9, adult: Secondary | ICD-10-CM | POA: Diagnosis not present

## 2015-07-10 DIAGNOSIS — Z720 Tobacco use: Secondary | ICD-10-CM | POA: Diagnosis not present

## 2015-07-10 DIAGNOSIS — Z87898 Personal history of other specified conditions: Secondary | ICD-10-CM | POA: Diagnosis not present

## 2015-07-10 DIAGNOSIS — K811 Chronic cholecystitis: Secondary | ICD-10-CM | POA: Diagnosis not present

## 2015-07-10 DIAGNOSIS — K7581 Nonalcoholic steatohepatitis (NASH): Secondary | ICD-10-CM | POA: Diagnosis not present

## 2015-07-10 DIAGNOSIS — J449 Chronic obstructive pulmonary disease, unspecified: Secondary | ICD-10-CM | POA: Diagnosis not present

## 2015-07-10 DIAGNOSIS — K2270 Barrett's esophagus without dysplasia: Secondary | ICD-10-CM | POA: Diagnosis not present

## 2015-07-10 DIAGNOSIS — I10 Essential (primary) hypertension: Secondary | ICD-10-CM | POA: Diagnosis not present

## 2015-07-18 ENCOUNTER — Other Ambulatory Visit: Payer: Self-pay

## 2015-07-18 ENCOUNTER — Encounter (HOSPITAL_COMMUNITY)
Admission: RE | Admit: 2015-07-18 | Discharge: 2015-07-18 | Disposition: A | Discharge status: Home / Self Care | Payer: Medicare Other | Source: Ambulatory Visit | Attending: General Surgery | Admitting: General Surgery

## 2015-07-18 ENCOUNTER — Encounter (HOSPITAL_COMMUNITY): Payer: Self-pay

## 2015-07-18 DIAGNOSIS — E785 Hyperlipidemia, unspecified: Secondary | ICD-10-CM | POA: Insufficient documentation

## 2015-07-18 DIAGNOSIS — I739 Peripheral vascular disease, unspecified: Secondary | ICD-10-CM | POA: Insufficient documentation

## 2015-07-18 DIAGNOSIS — Z01818 Encounter for other preprocedural examination: Secondary | ICD-10-CM | POA: Insufficient documentation

## 2015-07-18 DIAGNOSIS — Z7982 Long term (current) use of aspirin: Secondary | ICD-10-CM | POA: Insufficient documentation

## 2015-07-18 DIAGNOSIS — I10 Essential (primary) hypertension: Secondary | ICD-10-CM | POA: Insufficient documentation

## 2015-07-18 DIAGNOSIS — J449 Chronic obstructive pulmonary disease, unspecified: Secondary | ICD-10-CM | POA: Diagnosis not present

## 2015-07-18 DIAGNOSIS — K7581 Nonalcoholic steatohepatitis (NASH): Secondary | ICD-10-CM | POA: Insufficient documentation

## 2015-07-18 DIAGNOSIS — Z8673 Personal history of transient ischemic attack (TIA), and cerebral infarction without residual deficits: Secondary | ICD-10-CM | POA: Insufficient documentation

## 2015-07-18 DIAGNOSIS — K219 Gastro-esophageal reflux disease without esophagitis: Secondary | ICD-10-CM | POA: Insufficient documentation

## 2015-07-18 DIAGNOSIS — I2510 Atherosclerotic heart disease of native coronary artery without angina pectoris: Secondary | ICD-10-CM | POA: Diagnosis not present

## 2015-07-18 DIAGNOSIS — J45909 Unspecified asthma, uncomplicated: Secondary | ICD-10-CM | POA: Diagnosis not present

## 2015-07-18 DIAGNOSIS — Z79899 Other long term (current) drug therapy: Secondary | ICD-10-CM | POA: Insufficient documentation

## 2015-07-18 DIAGNOSIS — Z01812 Encounter for preprocedural laboratory examination: Secondary | ICD-10-CM | POA: Diagnosis not present

## 2015-07-18 HISTORY — DX: Personal history of urinary calculi: Z87.442

## 2015-07-18 HISTORY — DX: Urgency of urination: R39.15

## 2015-07-18 HISTORY — DX: Acute myocardial infarction, unspecified: I21.9

## 2015-07-18 HISTORY — DX: Pain in unspecified joint: M25.50

## 2015-07-18 HISTORY — DX: Personal history of other diseases of the respiratory system: Z87.09

## 2015-07-18 HISTORY — DX: Atherosclerotic heart disease of native coronary artery without angina pectoris: I25.10

## 2015-07-18 HISTORY — DX: Unspecified osteoarthritis, unspecified site: M19.90

## 2015-07-18 LAB — COMPREHENSIVE METABOLIC PANEL
ALK PHOS: 133 U/L — AB (ref 38–126)
ALT: 51 U/L (ref 17–63)
AST: 37 U/L (ref 15–41)
Albumin: 4.4 g/dL (ref 3.5–5.0)
Anion gap: 8 (ref 5–15)
BUN: 15 mg/dL (ref 6–20)
CHLORIDE: 105 mmol/L (ref 101–111)
CO2: 24 mmol/L (ref 22–32)
CREATININE: 0.85 mg/dL (ref 0.61–1.24)
Calcium: 9.9 mg/dL (ref 8.9–10.3)
GFR calc non Af Amer: >60 mL/min (ref >60)
Glucose, Bld: 99 mg/dL (ref 65–99)
POTASSIUM: 4.1 mmol/L (ref 3.5–5.1)
SODIUM: 137 mmol/L (ref 135–145)
TOTAL PROTEIN: 6.8 g/dL (ref 6.5–8.1)
Total Bilirubin: 1 mg/dL (ref 0.3–1.2)

## 2015-07-18 LAB — CBC WITH DIFFERENTIAL/PLATELET
Basophils Absolute: 0 10*3/uL (ref 0.0–0.1)
Basophils Relative: 0 % (ref 0–1)
Eosinophils Absolute: 0.2 10*3/uL (ref 0.0–0.7)
Eosinophils Relative: 2 % (ref 0–5)
HCT: 47.5 % (ref 39.0–52.0)
HEMOGLOBIN: 16.6 g/dL (ref 13.0–17.0)
LYMPHS PCT: 40 % (ref 12–46)
Lymphs Abs: 3.8 10*3/uL (ref 0.7–4.0)
MCH: 30.1 pg (ref 26.0–34.0)
MCHC: 34.9 g/dL (ref 30.0–36.0)
MCV: 86.2 fL (ref 78.0–100.0)
Monocytes Absolute: 0.7 10*3/uL (ref 0.1–1.0)
Monocytes Relative: 7 % (ref 3–12)
Neutro Abs: 4.8 10*3/uL (ref 1.7–7.7)
Neutrophils Relative %: 51 % (ref 43–77)
PLATELETS: 189 10*3/uL (ref 150–400)
RBC: 5.51 MIL/uL (ref 4.22–5.81)
RDW: 12.5 % (ref 11.5–15.5)
WBC: 9.4 10*3/uL (ref 4.0–10.5)

## 2015-07-18 LAB — URINALYSIS, ROUTINE W REFLEX MICROSCOPIC
Bilirubin Urine: NEGATIVE
Glucose, UA: NEGATIVE mg/dL
Hgb urine dipstick: NEGATIVE
KETONES UR: NEGATIVE mg/dL
Leukocytes, UA: NEGATIVE
NITRITE: NEGATIVE
Protein, ur: NEGATIVE mg/dL
SPECIFIC GRAVITY, URINE: 1.016 (ref 1.005–1.030)
UROBILINOGEN UA: 0.2 mg/dL (ref 0.0–1.0)
pH: 5 (ref 5.0–8.0)

## 2015-07-18 MED ORDER — CHLORHEXIDINE GLUCONATE 4 % EX LIQD: 1.0000 "application " | Freq: Once | CUTANEOUS | Status: DC

## 2015-07-18 NOTE — Progress Notes (Signed)
   07/18/15 1115  OBSTRUCTIVE SLEEP APNEA  Have you ever been diagnosed with sleep apnea through a sleep study? No  Do you snore loudly (loud enough to be heard through closed doors)?  1  Do you often feel tired, fatigued, or sleepy during the daytime? 0  Has anyone observed you stop breathing during your sleep? 1  Do you have, or are you being treated for high blood pressure? 1  BMI more than 35 kg/m2? 0  Age over 55 years old? 1  Neck circumference greater than 40 cm/16 inches? 1 (18)  Gender: 1

## 2015-07-18 NOTE — Progress Notes (Addendum)
Cardiologist states he saw one in 2010 but not since 2013  Medical Md is with Claris Gladden  EKG denies EKG in past yr  CXR in epic from 10-23-14  Echo report in epic from 2013  Stress test and heart cath report in epic from 2010

## 2015-07-18 NOTE — Pre-Procedure Instructions (Signed)
[name] [name]  07/18/2015      [facility] - [place], [place] - [address] [place] [address] Phone: [phone] Fax: [phone]    Your procedure is scheduled on Mon, July 25 @ 7:30 AM  Report to [facility] Admitting at 5:30 AM.  Call this number if you have problems the morning of surgery:  [phone]   Remember:  Do not eat food or drink liquids after midnight.  Take these medicines the morning of surgery with A SIP OF WATER Allopurinol(Zyloprim),Amlodipine(Norvasc),Nexium(Esomeprazole),Metoprolol(Lopressor),and Pain Pill(if needed)              Stop taking your Naproxen and Aspirin along with any Vitamins or Herbal Medications. No Goody's,BC's,Ibuprofen,or Fish Oil.   Do not wear jewelry.  Do not wear lotions, powders, or colognes.  You may wear deodorant.  Men may shave face and neck.  Do not bring valuables to the hospital.  [facility] is not responsible for any belongings or valuables.  Contacts, dentures or bridgework may not be worn into surgery.  Leave your suitcase in the car.  After surgery it may be brought to your room.  For patients admitted to the hospital, discharge time will be determined by your treatment team.  Patients discharged the day of surgery will not be allowed to drive home.    Special instructions:  [facility] - Preparing for Surgery  Before surgery, you can play an important role.  Because skin is not sterile, your skin needs to be as free of germs as possible.  You can reduce the number of germs on you skin by washing with CHG (chlorahexidine gluconate) soap before surgery.  CHG is an antiseptic cleaner which kills germs and bonds with the skin to continue killing germs even after washing.  Please DO NOT use if you have an allergy to CHG or antibacterial soaps.  If your skin becomes reddened/irritated stop using the CHG and inform your nurse when you arrive at Short Stay.  Do not  shave (including legs and underarms) for at least 48 hours prior to the first CHG shower.  You may shave your face.  Please follow these instructions carefully:   1.  Shower with CHG Soap the night before surgery and the                                morning of Surgery.  2.  If you choose to wash your hair, wash your hair first as usual with your       normal shampoo.  3.  After you shampoo, rinse your hair and body thoroughly to remove the                      Shampoo.  4.  Use CHG as you would any other liquid soap.  You can apply chg directly       to the skin and wash gently with scrungie or a clean washcloth.  5.  Apply the CHG Soap to your body ONLY FROM THE NECK DOWN.        Do not use on open wounds or open sores.  Avoid contact with your eyes,       ears, mouth and genitals (private parts).  Wash genitals (private parts)       with your normal soap.  6.  Wash thoroughly, paying special  attention to the area where your surgery        will be performed.  7.  Thoroughly rinse your body with warm water from the neck down.  8.  DO NOT shower/wash with your normal soap after using and rinsing off       the CHG Soap.  9.  Pat yourself dry with a clean towel.            10.  Wear clean pajamas.            11.  Place clean sheets on your bed the night of your first shower and do not        sleep with pets.  Day of Surgery  Do not apply any lotions/deoderants the morning of surgery.  Please wear clean clothes to the hospital/surgery center.    Please read over the following fact sheets that you were given. Pain Booklet, Coughing and Deep Breathing and Surgical Site Infection Prevention

## 2015-07-19 ENCOUNTER — Encounter (HOSPITAL_COMMUNITY): Payer: Self-pay

## 2015-07-19 NOTE — Progress Notes (Addendum)
Anesthesia Chart Review: Patient is a 54 year old male scheduled for cholecystectomy on 07/23/15 by Dr. Dalbert Batman.  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 BMI is consistent with obesity. OSA screening score is 6.  PCP is Dr. Shirline Frees. Has previously been seen by cardiologist Dr. Peter Martinique. Last cardiology encounter seen in [place] is from 09/2013 when he was admitted for CP felt likely due to accelerated HTN.  He had a non-ischemic stress test at that time.  Meds include allopurinol, amlodipine, ASA 313m, Nexium, losartan, milk thistle, Nitro, fish oil, Zofran, Percocet, trazodone. (Back in 2014 he was on metoprolol and Imdur which are no longer on his medication list.)   07/18/15 EKG: NSR, minimal voltage criteria for LVH, may be normal variant, incomplete right BBB.  10/11/13 Nuclear stress test (done during admission for chest pain): IMPRESSION: Normal lexiscan myovue No ischemia or infarction. EF 57%.  Normal Holter monitor in 05/2012.  05/28/12 TEE: Study Conclusions - Left ventricle: Systolic function was normal. The estimated ejection fraction was in the range of 55% to 60%. Wall motion was normal; there were no regional wall motion abnormalities. - Left atrium: No evidence of thrombus in the atrial cavity or appendage. - Atrial septum: No defect or patent foramen ovale was identified. Impressions: - Negative saline microcavitation study. No cardiac source of emboli was indentified.  11/29/09 Cardiac cath: 20% distal LM. 70-80% ostial LAD. 30% ostial OM1. 40% proximal to mid RCA. LVEF 60%. No regional wall motion abnormalities. Medical therapy recommended initially with follow-up stress test on therapy. If  there was significant ischemia in the LAD territory would consider possible PCI versus CABG (LIMA to LAD). The lesion is not amenable to stenting given its proximity to the LM. He may be a candidate for cutting balloon angioplasty, but would have a high recurrence rate. (Dr. Peter JMartinique. He went on to have a normal myocardial perfusion study on 01/25/09 with continued medical therapy recommended.  05/17/12 Carotid duplex: Summary: Bilateral: mild soft plaque origin ICA. No signficant ICA stenosis. Vertebral artery flow is antegrade.  10/23/14 CXR: IMPRESSION: Mild enlargement of cardiac silhouette. Mild chronic bronchitic interstitial changes without acute abnormalities.  Preoperative labs noted.   Discussed with anesthesiologist Dr. STamala Julian Recommend cardiac clearance due to known moderate LAD disease without recent cardiology follow-up and need for general surgery. I have notified triage nurse WAbigail Buttsat CTull    AGeorge HughMSt[facility]/Anesthesiology Phone [phone]/21/2016 11:34 AM  Addendum: Patient was seen by LCecilie Kicks NP with [facility].   07/24/15 Nuclear stress test:  The left ventricular ejection fraction is normal (55-65%).  The study is normal.  Nuclear stress EF: 55%.  There is no ischemia.  The LV function is normal  Based on these results, patient was cleared for surgery by Dr. BMare Ferrari   AGeorge HughMCast[facility] LLC[facility] Center/Anesthesiology Phone [phone]/29/2016 9:35 AM

## 2015-07-23 ENCOUNTER — Ambulatory Visit (INDEPENDENT_AMBULATORY_CARE_PROVIDER_SITE_OTHER): Payer: Medicare Other | Admitting: Cardiology

## 2015-07-23 ENCOUNTER — Encounter: Payer: Self-pay | Admitting: Cardiology

## 2015-07-23 ENCOUNTER — Telehealth (HOSPITAL_COMMUNITY): Payer: Self-pay | Admitting: *Deleted

## 2015-07-23 VITALS — BP 135/80 | HR 67 | Ht 68.0 in | Wt 226.0 lb

## 2015-07-23 DIAGNOSIS — E7800 Pure hypercholesterolemia, unspecified: Secondary | ICD-10-CM

## 2015-07-23 DIAGNOSIS — I10 Essential (primary) hypertension: Secondary | ICD-10-CM

## 2015-07-23 DIAGNOSIS — Z720 Tobacco use: Secondary | ICD-10-CM

## 2015-07-23 DIAGNOSIS — Z01810 Encounter for preprocedural cardiovascular examination: Secondary | ICD-10-CM

## 2015-07-23 DIAGNOSIS — I2510 Atherosclerotic heart disease of native coronary artery without angina pectoris: Secondary | ICD-10-CM

## 2015-07-23 NOTE — Telephone Encounter (Signed)
Patient given detailed instructions per Myocardial Perfusion Study Information Sheet for test on 07/24/15 at 0830. Patient Notified to arrive 15 minutes early, and that it is imperative to arrive on time for appointment to keep from having the test rescheduled. Patient verbalized understanding. Caly Pellum, Ranae Palms

## 2015-07-23 NOTE — Progress Notes (Signed)
Cardiology Office Note   Date:  07/23/2015   ID:  [name] [name], DOB 1961/03/20, MRN [id number]  PCP:  Shirline Frees, MD  Cardiologist:  Dr, Mare Ferrari    Chief Complaint  Patient presents with  . Coronary Artery Disease    surgical clearance for gallbladder removal.       History of Present Illness: [name] [name] is a 54 y.o. male who presents for surgical clearance for lap chole by Dr. Dalbert Batman.  He has had 3 months of pain from gallbladder disease.  Has problems eating due to the pain.  He was to have surgery today but due to cardiac Hx he was referred for cardiology clearance.    He has hx of CAD with cath 2010 with 20% LM and 70-80% LAD- ostial lesion, 30-40% RCA and LCX dz.  EF 60%.  He had nuc following the cath without ischemia and then again in 2014 without ischemia.    Today he denies any chest pain or SOB.  We does walk on [place].5 acres stops at times due to back pain.  He continues to smoke 1 ppd but down from 2.5 ppd.  Does not take cholesterol meds- afraid of the side effects.  We discussed importance of taking.    His wife became angry because of recommendation for testing.  She wants his gallbladder out because he has been sick for 3 months.  Her argument was if this was an emergency you would clear, but this is not emergency She left the room.  Pt understood- that anesthesia needs our clearance.     Past Medical History  Diagnosis Date  . Gout     takes ALlopurinol daily  . Adenomatous colon polyp 2009  . Lymphocytic colitis 2009  . COPD (chronic obstructive pulmonary disease)   . NASH (nonalcoholic steatohepatitis)   . Hyperlipidemia     was given a script a month ago but scared to take it.Medical Md is aware  . TIA (transient ischemic attack)   . Asthma   . Alcohol abuse     abstinent since 1992  . Hemorrhoids   . Chronic back pain   . Personal history of colonic polyps 07/20/2002    Diminutive adenomas (3) 06/2002 diminutive adenoma (1) 2011   .  Peripheral vascular disease   . Barrett's esophagus 05/22/2015  . Hypertension     takes Losartan,Metoprolol,and Amlodipine daily  . GERD (gastroesophageal reflux disease)     takes Nexium daily  . History of bronchitis 2015  . Stroke   . Arthritis     back  . Joint pain   . Urinary urgency   . History of kidney stones   . Coronary artery disease     70-80% ostial LAD '10  . Myocardial infarction 2010    "light" (admit for CP 11/2009; ruled out for MI but had 70-80% ostail LAD stenosis and treated medically following NL perfusion study)    Past Surgical History  Procedure Laterality Date  . Lumbar spine surgery      x 2  . Nasal sinus surgery    . 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 Colonoscopy  multiple  . Band hemorrhoidectomy  2003    at sigmoidoscopy  . Tee without cardioversion  05/28/2012    Procedure: TRANSESOPHAGEAL ECHOCARDIOGRAM (TEE);  Surgeon: Lelon Perla, MD;  Location: [facility] ENDOSCOPY;  Service: Cardiovascular;  Laterality: N/A;  . Cardiac catheterization  2010  . Esophagogastroduodenoscopy  Current Outpatient Prescriptions  Medication Sig Dispense Refill  . allopurinol (ZYLOPRIM) 100 MG tablet Take 100 mg by mouth daily.    Marland Kitchen amLODipine (NORVASC) 10 MG tablet Take 10 mg by mouth daily.    Marland Kitchen aspirin 325 MG tablet Take 325 mg by mouth daily.    Marland Kitchen esomeprazole (NEXIUM) 40 MG capsule Take 40 mg by mouth 2 (two) times daily.    . metoprolol (LOPRESSOR) 100 MG tablet Take 100 mg by mouth 2 (two) times daily.    . Milk Thistle 500 MG CAPS Take 1,000 mg by mouth daily.     . Multiple Vitamin (MULITIVITAMIN WITH MINERALS) TABS Take 1 tablet by mouth daily.    . naproxen (NAPROSYN) 500 MG tablet Take 500 mg by mouth every 12 (twelve) hours.    . nitroGLYCERIN (NITROSTAT) 0.4 MG SL tablet Place 0.4 mg under the tongue every 5 (five) minutes as needed. For chest pain    . Omega-3 Fatty Acids (FISH OIL) 1200 MG CPDR Take 1,200 mg by mouth daily.    .  traZODone (DESYREL) 50 MG tablet Take 50 mg by mouth at bedtime.     No current facility-administered medications for this visit.    Allergies:   Penicillins    Social History:  The patient  reports that he has been smoking Cigarettes.  He has a 4 pack-year smoking history. He has never used smokeless tobacco. He reports that he does not drink alcohol or use illicit drugs.   Family History:  The patient's family history includes Breast cancer in his mother; Cancer in his father and mother; Diabetes in his paternal grandmother and sister; Heart attack in his father and mother; Heart disease in his father and another family member; Hyperlipidemia in his father and sister; Hypertension in his brother, father, mother, and sister; Liver cancer in his mother; Liver disease in his mother. There is no history of Colon cancer.    ROS:  General:no colds or fevers, no weight changes Skin:no rashes or ulcers HEENT:no blurred vision, no congestion CV:see HPI PUL:see HPI GI:no diarrhea constipation + hemorrhoids with bright blood on occ. No true  melena, no indigestion GU:no hematuria, no dysuria MS:no joint pain- + back pain, no claudication Neuro:no syncope, no lightheadedness Endo:no diabetes, no thyroid disease  Wt Readings from Last 3 Encounters:  07/23/15 226 lb (102.513 kg)  07/18/15 224 lb 3.2 oz (101.696 kg)  06/18/15 229 lb (103.874 kg)     PHYSICAL EXAM: VS:  BP 135/80 mmHg  Pulse 67  Ht 5' 8"  (1.727 m)  Wt 226 lb (102.513 kg)  BMI 34.37 kg/m2 , BMI Body mass index is 34.37 kg/(m^2). General:Pleasant affect, NAD Skin:Warm and dry, brisk capillary refill HEENT:normocephalic, sclera clear, mucus membranes moist Neck:supple, no JVD, no bruits  Heart:S1S2 RRR without murmur, gallup, rub or click Lungs:clear without rales, rhonchi, or wheezes YCX:KGYJ, + tenderness, + BS, do not palpate liver spleen or masses Ext:no lower ext edema, 2+ pedal pulses, 2+ radial pulses Neuro:alert  and oriented X 3, MAE, follows commands, + facial symmetry    EKG:  EKG is ordered today. The ekg ordered today demonstrates SR normal EKG    Recent Labs: 07/18/2015: ALT 51; BUN 15; Creatinine, Ser 0.85; Hemoglobin 16.6; Platelets 189; Potassium 4.1; Sodium 137    Lipid Panel    Component Value Date/Time   CHOL 162 05/27/2012 0540   TRIG 335* 05/27/2012 0540   HDL 26* 05/27/2012 0540   CHOLHDL 6.2 05/27/2012 0540  VLDL 67* 05/27/2012 0540   LDLCALC 69 05/27/2012 0540       Other studies Reviewed: Additional studies/ records that were reviewed today include: cardiac cath 2010, nuc studies 2010 and 2014, holter monitor 2013 with SR.  .   ASSESSMENT AND PLAN:  1.  Gallbladder disease with need for lap chole, was scheduled for today but anesthesia requested he have surgical clearance.  We does have significant disease and not been seen since 2014.    2. CAD with cath 2010, 70-80% LAD 20% LM.  Non obstructive disease on LCX and RCA. Nuc studies in 2010 and 2014 were negative.  Will plan for lexiscan myoview, he cannot walk on treadmill due to back pain.  Will try and do tomorrow so if stable then can clear for surgery ASAP.    3. Tobacco use -instructed to decrease and stop  4. Hypercholesterolemia.  No lipids until recovered from surgery and he will try statin at that time.   5. HTN controlled.   He will follow up with Dr. Guss Bunde bill in 3 months to have in the system and further instruction on CAD.    Pt understood the need for nuc study and was agreeable.  Wife had left room, I understand she just wants her husband to feel better.     Current medicines are reviewed with the patient today.  The patient Has no concerns regarding medicines.  The following changes have been made:  See above Labs/ tests ordered today include:see above  Disposition:   FU:  see above  Lennie Muckle, NP  07/23/2015 10:33 AM    [facility] [place], [place], [place], [place] Phone: [phone]; Fax: [phone]  I have seen, examined the patient, and reviewed the above assessment and plan.  Changes to above are made where necessary.    Co Sign: Thompson Grayer, MD 07/23/2015 9:42 PM

## 2015-07-23 NOTE — Patient Instructions (Signed)
Medication Instructions: . Your physician recommends that you continue on your current medications as directed. Please refer to the Current Medication list given to you today.   Labwork: None   Testing/Procedures: Your physician has requested that you have a lexiscan myoview. For further information please visit [email or web address]. Please follow instruction sheet, as given. (Please schedule on 07/14/15 pre-op clearance)  Follow-Up: Your physician wants you to follow-up in: 3 months with Dr.Brackbill You will receive a reminder letter in the mail two months in advance. If you don't receive a letter, please call our office to schedule the follow-up appointment.   Any Other Special Instructions Will Be Listed Below (If Applicable).

## 2015-07-24 ENCOUNTER — Ambulatory Visit (HOSPITAL_COMMUNITY): Payer: Medicare Other | Attending: Cardiovascular Disease

## 2015-07-24 DIAGNOSIS — Z01810 Encounter for preprocedural cardiovascular examination: Secondary | ICD-10-CM | POA: Diagnosis not present

## 2015-07-24 LAB — MYOCARDIAL PERFUSION IMAGING
CHL CUP NUCLEAR SDS: 1
CHL CUP NUCLEAR SSS: 3
LHR: 0.38
LVDIAVOL: 108 mL
LVSYSVOL: 49 mL
NUC STRESS TID: 1.01
Peak HR: 82 {beats}/min
Rest HR: 63 {beats}/min
SRS: 2

## 2015-07-24 MED ORDER — REGADENOSON 0.4 MG/5ML IV SOLN
0.4000 mg | Freq: Once | INTRAVENOUS | Status: AC
  Administered 2015-07-24: 0.4 mg via INTRAVENOUS

## 2015-07-24 MED ORDER — TECHNETIUM TC 99M SESTAMIBI GENERIC - CARDIOLITE
10.4000 | Freq: Once | INTRAVENOUS | Status: AC | PRN
  Administered 2015-07-24: 10 via INTRAVENOUS

## 2015-07-24 MED ORDER — TECHNETIUM TC 99M SESTAMIBI GENERIC - CARDIOLITE
32.9000 | Freq: Once | INTRAVENOUS | Status: AC | PRN
  Administered 2015-07-24: 32.9 via INTRAVENOUS

## 2015-07-26 ENCOUNTER — Telehealth: Payer: Self-pay

## 2015-07-26 NOTE — Telephone Encounter (Signed)
Pt aware of results. Per Dr.Brackbill.The stress test was normal. The patient is cleared for gall bladder surgery. Pt verbalized understanding.

## 2015-07-26 NOTE — Telephone Encounter (Signed)
-----   Message from [name] Serge, NP sent at 07/24/2015 10:30 PM EDT ----- If ok with Dr. Mare Ferrari pt is cleared for his gallbladder surgery.

## 2015-07-30 ENCOUNTER — Encounter: Payer: Self-pay | Admitting: Cardiology

## 2015-07-31 ENCOUNTER — Other Ambulatory Visit: Payer: Self-pay | Admitting: Family Medicine

## 2015-07-31 DIAGNOSIS — N281 Cyst of kidney, acquired: Secondary | ICD-10-CM

## 2015-08-02 MED ORDER — CIPROFLOXACIN IN D5W 400 MG/200ML IV SOLN
400.0000 mg | INTRAVENOUS | Status: AC
  Administered 2015-08-03: 400 mg via INTRAVENOUS
  Filled 2015-08-02: qty 200

## 2015-08-02 NOTE — H&P (Signed)
[facility]  Location: [facility] Patient #: [id number] DOB: [birth year] Married / Language: English / Race: White Male       History of Present Illness  .  The patient is a 54 year old male who presents with a complaint of chronic cholecystitis. This gentleman returns for further discussion of his biliary tract disease. He went ahead with the colonoscopy with Dr. Carlean Purl, and only benign polyps were found. Nothing to account for his pain. Hepatobiliary scan showed that the gallbladder did fill but had delayed emptying. He had significant right upper quadrant pain with the CCK infusion. I told him this made it more likely that the gallbladder was the cause of his problems. Recall that he had an ultrasound in May that showed calcifications in the wall of the gallbladder fundus but no stone or polyps. He has Barrett's esophagitis. He continues to have right upper quadrant pain intermittently. He has seen Dr. Donavan Burnet who told him that he did not think that his abdominal pain was due to mesenteric ischemia, even though CT angiogram showed peripheral vascular disease, luminal narrowing, possible vasculitis and renal artery stenosis. Comorbidities include recovering alcoholic, COPD, active tobacco use and asked him to stop. Hypertension. NASH, gout, lymphocytic colitis, Barrett's esophagus.  He wants to go ahead with the cholecystectomy in the near future. He will be scheduled for laparoscopic cholecystectomy with cholangiogram, possible open. I discussed the indications, details, techniques, and numerous risk of the surgery with him. He is aware of the risk of bleeding, infection, conversion to open laparotomy, bile leak with readmission for management, wound hernia, injury to adjacent organs with major reconstructive surgery, and other on proceeding problems. He understands all of these issues and all of his questions were answered. He agrees with this  plan.   Allergies Penicillamine *ASSORTED CLASSES*  Medication History  Allopurinol (100MG Tablet, Oral) Active. AmLODIPine Besylate (10MG Tablet, Oral) Active. Aspirin EC (325MG Tablet DR, Oral) Active. Losartan Potassium (100MG Tablet, Oral) Active. Metoprolol Tartrate (100MG Tablet, Oral) Active. Milk Thistle (500MG Capsule, Oral) Active. Multivitamins (Oral) Active. Naproxen (500MG Tablet, Oral) Active. Nitrostat (0.4MG Tab Sublingual, Sublingual) Active. Fish Oil + D3 (1200-1000MG-UNIT Capsule, Oral) Active. Zofran (4MG Tablet, Oral as needed) Active. Oxycodone-Acetaminophen (5-325MG Tablet, Oral) Active. Spironolactone (100MG Tablet, Oral) Active. TraZODone HCl (50MG Tablet, Oral) Active. Medications Reconciled  Vitals  Weight: 228 lb Height: 68in Body Surface Area: 2.23 m Body Mass Index: 34.67 kg/m Temp.: 97.29F(Temporal)  Pulse: 91 (Regular)  BP: 142/80 (Sitting, Left Arm, Standard)    Physical Exam  General Note: Alert. No distress. Cooperative. BMI 34.6   Head and Neck Note: No adenopathy or mass   Chest and Lung Exam Note: Clear to auscultation bilaterally no back or rib cage tenderness.   Cardiovascular Note: Regular rate and rhythm. No murmur. No ectopy.   Abdomen Note: Soft. Not distended. Subjectively a little bit tender to palpate deep in the right upper quadrant but no guarding or rebound. No mass. No scars or hernias.     Assessment & Plan  CHRONIC CHOLECYSTITIS (575.11  K81.1)    Your biliary scan was abnormal in that the gallbladder was slow to function and he developed right upper quadrant pain after getting the CCK injection. This lts me know that there is a high likelihood that hyour pain is due to your gallbladder Your colonoscopy showed some benign polyps, but nothing that would explain her pain We have decided to proceed with scheduling you for laparoscopic cholecystectomy with  cholangiogram, possible open cholecystectomy We have discussed the indications, techniques, and numerous risk of the surgery Please read the printed patient information that I gave you  Pt Education - Laparoscopic Cholecystectomy: laparoscopic cholecystectomy    TOBACCO ABUSE (305.1  Z72.0) Impression: I strongly encouraged the patient to quit. Advised to see his PCP.  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 Dalbert Batman, M.D., [facility] General and Minimally invasive Surgery Breast and Colorectal Surgery Office:   [phone] Pager:   [phone]

## 2015-08-03 ENCOUNTER — Ambulatory Visit (HOSPITAL_COMMUNITY)
Admission: RE | Admit: 2015-08-03 | Discharge: 2015-08-04 | Disposition: A | Discharge status: Home / Self Care | Payer: Medicare Other | Source: Ambulatory Visit | Attending: General Surgery | Admitting: General Surgery

## 2015-08-03 ENCOUNTER — Ambulatory Visit (HOSPITAL_COMMUNITY): Payer: Medicare Other

## 2015-08-03 ENCOUNTER — Ambulatory Visit (HOSPITAL_COMMUNITY): Payer: Medicare Other | Admitting: Vascular Surgery

## 2015-08-03 ENCOUNTER — Encounter (HOSPITAL_COMMUNITY)
Admission: RE | Disposition: A | Discharge status: Home / Self Care | Payer: Self-pay | Source: Ambulatory Visit | Attending: General Surgery

## 2015-08-03 ENCOUNTER — Ambulatory Visit (HOSPITAL_COMMUNITY): Payer: Medicare Other | Admitting: Anesthesiology

## 2015-08-03 ENCOUNTER — Encounter (HOSPITAL_COMMUNITY): Payer: Self-pay

## 2015-08-03 DIAGNOSIS — K811 Chronic cholecystitis: Secondary | ICD-10-CM

## 2015-08-03 DIAGNOSIS — I10 Essential (primary) hypertension: Secondary | ICD-10-CM | POA: Diagnosis not present

## 2015-08-03 DIAGNOSIS — J449 Chronic obstructive pulmonary disease, unspecified: Secondary | ICD-10-CM | POA: Insufficient documentation

## 2015-08-03 DIAGNOSIS — K8010 Calculus of gallbladder with chronic cholecystitis without obstruction: Secondary | ICD-10-CM | POA: Diagnosis not present

## 2015-08-03 DIAGNOSIS — M549 Dorsalgia, unspecified: Secondary | ICD-10-CM | POA: Diagnosis not present

## 2015-08-03 DIAGNOSIS — K219 Gastro-esophageal reflux disease without esophagitis: Secondary | ICD-10-CM | POA: Diagnosis not present

## 2015-08-03 HISTORY — PX: CHOLECYSTECTOMY: SHX55

## 2015-08-03 LAB — COMPREHENSIVE METABOLIC PANEL
ALBUMIN: 4.1 g/dL (ref 3.5–5.0)
ALT: 41 U/L (ref 17–63)
AST: 35 U/L (ref 15–41)
Alkaline Phosphatase: 120 U/L (ref 38–126)
Anion gap: 10 (ref 5–15)
BILIRUBIN TOTAL: 0.8 mg/dL (ref 0.3–1.2)
BUN: 10 mg/dL (ref 6–20)
CALCIUM: 9.5 mg/dL (ref 8.9–10.3)
CO2: 23 mmol/L (ref 22–32)
Chloride: 106 mmol/L (ref 101–111)
Creatinine, Ser: 0.9 mg/dL (ref 0.61–1.24)
GFR calc Af Amer: >60 mL/min (ref >60)
GFR calc non Af Amer: >60 mL/min (ref >60)
GLUCOSE: 105 mg/dL — AB (ref 65–99)
POTASSIUM: 4 mmol/L (ref 3.5–5.1)
SODIUM: 139 mmol/L (ref 135–145)
Total Protein: 6.5 g/dL (ref 6.5–8.1)

## 2015-08-03 LAB — URINALYSIS, ROUTINE W REFLEX MICROSCOPIC
Bilirubin Urine: NEGATIVE
Glucose, UA: NEGATIVE mg/dL
Hgb urine dipstick: NEGATIVE
Ketones, ur: NEGATIVE mg/dL
Leukocytes, UA: NEGATIVE
Nitrite: NEGATIVE
Protein, ur: NEGATIVE mg/dL
Specific Gravity, Urine: 1.018 (ref 1.005–1.030)
Urobilinogen, UA: 0.2 mg/dL (ref 0.0–1.0)
pH: 6 (ref 5.0–8.0)

## 2015-08-03 LAB — CBC WITH DIFFERENTIAL/PLATELET
Basophils Absolute: 0 10*3/uL (ref 0.0–0.1)
Basophils Relative: 0 % (ref 0–1)
EOS ABS: 0.1 10*3/uL (ref 0.0–0.7)
Eosinophils Relative: 2 % (ref 0–5)
HEMATOCRIT: 45 % (ref 39.0–52.0)
Hemoglobin: 15.5 g/dL (ref 13.0–17.0)
Lymphocytes Relative: 35 % (ref 12–46)
Lymphs Abs: 2.6 10*3/uL (ref 0.7–4.0)
MCH: 29.6 pg (ref 26.0–34.0)
MCHC: 34.4 g/dL (ref 30.0–36.0)
MCV: 85.9 fL (ref 78.0–100.0)
Monocytes Absolute: 0.5 10*3/uL (ref 0.1–1.0)
Monocytes Relative: 7 % (ref 3–12)
Neutro Abs: 4.1 10*3/uL (ref 1.7–7.7)
Neutrophils Relative %: 56 % (ref 43–77)
PLATELETS: 187 10*3/uL (ref 150–400)
RBC: 5.24 MIL/uL (ref 4.22–5.81)
RDW: 12.4 % (ref 11.5–15.5)
WBC: 7.4 10*3/uL (ref 4.0–10.5)

## 2015-08-03 SURGERY — LAPAROSCOPIC CHOLECYSTECTOMY WITH INTRAOPERATIVE CHOLANGIOGRAM
Anesthesia: General | Site: Abdomen

## 2015-08-03 MED ORDER — NITROGLYCERIN 0.4 MG SL SUBL: 0.4000 mg | SUBLINGUAL_TABLET | SUBLINGUAL | Status: DC | PRN

## 2015-08-03 MED ORDER — BUPIVACAINE-EPINEPHRINE (PF) 0.25% -1:200000 IJ SOLN
INTRAMUSCULAR | Status: AC
  Filled 2015-08-03: qty 30

## 2015-08-03 MED ORDER — DEXAMETHASONE SODIUM PHOSPHATE 4 MG/ML IJ SOLN
INTRAMUSCULAR | Status: AC
  Filled 2015-08-03: qty 1

## 2015-08-03 MED ORDER — FENTANYL CITRATE (PF) 100 MCG/2ML IJ SOLN
INTRAMUSCULAR | Status: DC | PRN
  Administered 2015-08-03 (×2): 50 ug via INTRAVENOUS
  Administered 2015-08-03: 100 ug via INTRAVENOUS
  Administered 2015-08-03 (×3): 50 ug via INTRAVENOUS

## 2015-08-03 MED ORDER — ENOXAPARIN SODIUM 40 MG/0.4ML ~~LOC~~ SOLN
40.0000 mg | SUBCUTANEOUS | Status: DC
  Administered 2015-08-04: 40 mg via SUBCUTANEOUS
  Filled 2015-08-03: qty 0.4

## 2015-08-03 MED ORDER — 0.9 % SODIUM CHLORIDE (POUR BTL) OPTIME
TOPICAL | Status: DC | PRN
  Administered 2015-08-03: 1000 mL

## 2015-08-03 MED ORDER — DEXAMETHASONE SODIUM PHOSPHATE 4 MG/ML IJ SOLN
INTRAMUSCULAR | Status: DC | PRN
  Administered 2015-08-03: 4 mg via INTRAVENOUS

## 2015-08-03 MED ORDER — VECURONIUM BROMIDE 10 MG IV SOLR
INTRAVENOUS | Status: AC
  Filled 2015-08-03: qty 10

## 2015-08-03 MED ORDER — FENTANYL CITRATE (PF) 250 MCG/5ML IJ SOLN
INTRAMUSCULAR | Status: AC
  Filled 2015-08-03: qty 5

## 2015-08-03 MED ORDER — POTASSIUM CHLORIDE IN NACL 20-0.9 MEQ/L-% IV SOLN
INTRAVENOUS | Status: DC
  Administered 2015-08-03 (×2): via INTRAVENOUS
  Filled 2015-08-03 (×2): qty 1000

## 2015-08-03 MED ORDER — EPHEDRINE SULFATE 50 MG/ML IJ SOLN
INTRAMUSCULAR | Status: AC
  Filled 2015-08-03: qty 1

## 2015-08-03 MED ORDER — HYDROMORPHONE HCL 1 MG/ML IJ SOLN
INTRAMUSCULAR | Status: AC
  Administered 2015-08-03: 0.5 mg via INTRAVENOUS
  Filled 2015-08-03: qty 1

## 2015-08-03 MED ORDER — GLYCOPYRROLATE 0.2 MG/ML IJ SOLN
INTRAMUSCULAR | Status: AC
  Filled 2015-08-03: qty 1

## 2015-08-03 MED ORDER — GLYCOPYRROLATE 0.2 MG/ML IJ SOLN
INTRAMUSCULAR | Status: AC
  Filled 2015-08-03: qty 3

## 2015-08-03 MED ORDER — MIDAZOLAM HCL 5 MG/5ML IJ SOLN
INTRAMUSCULAR | Status: DC | PRN
  Administered 2015-08-03: 2 mg via INTRAVENOUS

## 2015-08-03 MED ORDER — TRAZODONE HCL 50 MG PO TABS
50.0000 mg | ORAL_TABLET | Freq: Every day | ORAL | Status: DC
  Administered 2015-08-03: 50 mg via ORAL
  Filled 2015-08-03: qty 1

## 2015-08-03 MED ORDER — METOPROLOL TARTRATE 100 MG PO TABS
100.0000 mg | ORAL_TABLET | Freq: Two times a day (BID) | ORAL | Status: DC
  Administered 2015-08-04: 100 mg via ORAL
  Filled 2015-08-03 (×2): qty 1

## 2015-08-03 MED ORDER — HYDROCODONE-ACETAMINOPHEN 5-325 MG PO TABS
1.0000 | ORAL_TABLET | ORAL | Status: DC | PRN
  Administered 2015-08-03 – 2015-08-04 (×3): 2 via ORAL
  Filled 2015-08-03 (×3): qty 2

## 2015-08-03 MED ORDER — BUPIVACAINE-EPINEPHRINE 0.5% -1:200000 IJ SOLN
INTRAMUSCULAR | Status: DC | PRN
  Administered 2015-08-03: 10 mL

## 2015-08-03 MED ORDER — MIDAZOLAM HCL 2 MG/2ML IJ SOLN
INTRAMUSCULAR | Status: AC
  Filled 2015-08-03: qty 4

## 2015-08-03 MED ORDER — SUCCINYLCHOLINE CHLORIDE 20 MG/ML IJ SOLN
INTRAMUSCULAR | Status: AC
  Filled 2015-08-03: qty 1

## 2015-08-03 MED ORDER — ROCURONIUM BROMIDE 50 MG/5ML IV SOLN
INTRAVENOUS | Status: AC
  Filled 2015-08-03: qty 1

## 2015-08-03 MED ORDER — STERILE WATER FOR INJECTION IJ SOLN
INTRAMUSCULAR | Status: AC
  Filled 2015-08-03: qty 10

## 2015-08-03 MED ORDER — LIDOCAINE HCL (CARDIAC) 20 MG/ML IV SOLN
INTRAVENOUS | Status: AC
  Filled 2015-08-03: qty 5

## 2015-08-03 MED ORDER — EPHEDRINE SULFATE 50 MG/ML IJ SOLN
INTRAMUSCULAR | Status: DC | PRN
  Administered 2015-08-03: 5 mg via INTRAVENOUS
  Administered 2015-08-03: 10 mg via INTRAVENOUS

## 2015-08-03 MED ORDER — VECURONIUM BROMIDE 10 MG IV SOLR
INTRAVENOUS | Status: DC | PRN
  Administered 2015-08-03: 5 mg via INTRAVENOUS

## 2015-08-03 MED ORDER — PHENYLEPHRINE 40 MCG/ML (10ML) SYRINGE FOR IV PUSH (FOR BLOOD PRESSURE SUPPORT)
PREFILLED_SYRINGE | INTRAVENOUS | Status: AC
  Filled 2015-08-03: qty 10

## 2015-08-03 MED ORDER — PROPOFOL 10 MG/ML IV BOLUS
INTRAVENOUS | Status: DC | PRN
  Administered 2015-08-03: 200 mg via INTRAVENOUS

## 2015-08-03 MED ORDER — HYDROMORPHONE HCL 1 MG/ML IJ SOLN
1.0000 mg | INTRAMUSCULAR | Status: DC | PRN
  Administered 2015-08-03 (×2): 1 mg via INTRAVENOUS
  Filled 2015-08-03 (×2): qty 1

## 2015-08-03 MED ORDER — LACTATED RINGERS IV SOLN
INTRAVENOUS | Status: DC | PRN
  Administered 2015-08-03 (×2): via INTRAVENOUS

## 2015-08-03 MED ORDER — LIDOCAINE HCL (CARDIAC) 20 MG/ML IV SOLN
INTRAVENOUS | Status: DC | PRN
  Administered 2015-08-03: 80 mg via INTRAVENOUS
  Administered 2015-08-03: 100 mg via INTRATRACHEAL

## 2015-08-03 MED ORDER — NEOSTIGMINE METHYLSULFATE 10 MG/10ML IV SOLN
INTRAVENOUS | Status: DC | PRN
  Administered 2015-08-03: 4 mg via INTRAVENOUS

## 2015-08-03 MED ORDER — HYDROMORPHONE HCL 1 MG/ML IJ SOLN
0.2500 mg | INTRAMUSCULAR | Status: DC | PRN
  Administered 2015-08-03 (×2): 0.5 mg via INTRAVENOUS

## 2015-08-03 MED ORDER — ONDANSETRON 4 MG PO TBDP
4.0000 mg | ORAL_TABLET | Freq: Four times a day (QID) | ORAL | Status: DC | PRN
  Filled 2015-08-03: qty 1

## 2015-08-03 MED ORDER — ONDANSETRON HCL 4 MG/2ML IJ SOLN: 4.0000 mg | Freq: Four times a day (QID) | INTRAMUSCULAR | Status: DC | PRN

## 2015-08-03 MED ORDER — GLYCOPYRROLATE 0.2 MG/ML IJ SOLN
INTRAMUSCULAR | Status: DC | PRN
  Administered 2015-08-03: .5 mg via INTRAVENOUS

## 2015-08-03 MED ORDER — DEXAMETHASONE SODIUM PHOSPHATE 4 MG/ML IJ SOLN
INTRAMUSCULAR | Status: AC
  Filled 2015-08-03: qty 2

## 2015-08-03 MED ORDER — PROPOFOL 10 MG/ML IV BOLUS
INTRAVENOUS | Status: AC
  Filled 2015-08-03: qty 20

## 2015-08-03 MED ORDER — ONDANSETRON HCL 4 MG/2ML IJ SOLN
INTRAMUSCULAR | Status: DC | PRN
  Administered 2015-08-03: 4 mg via INTRAVENOUS

## 2015-08-03 MED ORDER — ALLOPURINOL 100 MG PO TABS
100.0000 mg | ORAL_TABLET | Freq: Every day | ORAL | Status: DC
  Administered 2015-08-04: 100 mg via ORAL
  Filled 2015-08-03 (×2): qty 1

## 2015-08-03 MED ORDER — PANTOPRAZOLE SODIUM 40 MG PO TBEC
40.0000 mg | DELAYED_RELEASE_TABLET | Freq: Every day | ORAL | Status: DC
  Administered 2015-08-04: 40 mg via ORAL
  Filled 2015-08-03 (×2): qty 1

## 2015-08-03 MED ORDER — SUCCINYLCHOLINE CHLORIDE 20 MG/ML IJ SOLN
INTRAMUSCULAR | Status: DC | PRN
  Administered 2015-08-03: 100 mg via INTRAVENOUS

## 2015-08-03 MED ORDER — SODIUM CHLORIDE 0.9 % IR SOLN
Status: DC | PRN
  Administered 2015-08-03: 1000 mL

## 2015-08-03 MED ORDER — SODIUM CHLORIDE 0.9 % IV SOLN
INTRAVENOUS | Status: DC | PRN
  Administered 2015-08-03: 17 mL

## 2015-08-03 MED ORDER — SODIUM CHLORIDE 0.9 % IJ SOLN
INTRAMUSCULAR | Status: AC
  Filled 2015-08-03: qty 10

## 2015-08-03 MED ORDER — AMLODIPINE BESYLATE 10 MG PO TABS
10.0000 mg | ORAL_TABLET | Freq: Every day | ORAL | Status: DC
  Administered 2015-08-04: 10 mg via ORAL
  Filled 2015-08-03 (×2): qty 1

## 2015-08-03 SURGICAL SUPPLY — 41 items
APPLIER CLIP ROT 10 11.4 M/L (STAPLE) ×3
BLADE SURG ROTATE 9660 (MISCELLANEOUS) IMPLANT
CANISTER SUCTION 2500CC (MISCELLANEOUS) ×3 IMPLANT
CHLORAPREP W/TINT 26ML (MISCELLANEOUS) ×3 IMPLANT
CLIP APPLIE ROT 10 11.4 M/L (STAPLE) ×1 IMPLANT
CLOSURE STERI-STRIP 1/2X4 (GAUZE/BANDAGES/DRESSINGS) ×1
CLSR STERI-STRIP ANTIMIC 1/2X4 (GAUZE/BANDAGES/DRESSINGS) ×2 IMPLANT
COVER MAYO STAND STRL (DRAPES) ×3 IMPLANT
COVER SURGICAL LIGHT HANDLE (MISCELLANEOUS) ×3 IMPLANT
DERMABOND ADVANCED (GAUZE/BANDAGES/DRESSINGS) ×2
DERMABOND ADVANCED .7 DNX12 (GAUZE/BANDAGES/DRESSINGS) ×1 IMPLANT
DRAPE C-ARM 42X72 X-RAY (DRAPES) ×3 IMPLANT
ELECT REM PT RETURN 9FT ADLT (ELECTROSURGICAL) ×3
ELECTRODE REM PT RTRN 9FT ADLT (ELECTROSURGICAL) ×1 IMPLANT
GLOVE BIO SURGEON STRL SZ 6.5 (GLOVE) ×8 IMPLANT
GLOVE BIO SURGEONS STRL SZ 6.5 (GLOVE) ×4
GLOVE BIOGEL PI IND STRL 7.5 (GLOVE) ×3 IMPLANT
GLOVE BIOGEL PI INDICATOR 7.5 (GLOVE) ×6
GLOVE EUDERMIC 7 POWDERFREE (GLOVE) ×3 IMPLANT
GOWN STRL REUS W/ TWL LRG LVL3 (GOWN DISPOSABLE) ×2 IMPLANT
GOWN STRL REUS W/ TWL XL LVL3 (GOWN DISPOSABLE) ×1 IMPLANT
GOWN STRL REUS W/TWL LRG LVL3 (GOWN DISPOSABLE) ×4
GOWN STRL REUS W/TWL XL LVL3 (GOWN DISPOSABLE) ×2
KIT BASIN OR (CUSTOM PROCEDURE TRAY) ×3 IMPLANT
KIT ROOM TURNOVER OR (KITS) ×3 IMPLANT
NS IRRIG 1000ML POUR BTL (IV SOLUTION) ×3 IMPLANT
PAD ARMBOARD 7.5X6 YLW CONV (MISCELLANEOUS) ×3 IMPLANT
POUCH SPECIMEN RETRIEVAL 10MM (ENDOMECHANICALS) ×3 IMPLANT
SCISSORS LAP 5X35 DISP (ENDOMECHANICALS) ×3 IMPLANT
SET CHOLANGIOGRAPH 5 50 .035 (SET/KITS/TRAYS/PACK) ×3 IMPLANT
SET IRRIG TUBING LAPAROSCOPIC (IRRIGATION / IRRIGATOR) ×3 IMPLANT
SLEEVE ENDOPATH XCEL 5M (ENDOMECHANICALS) ×3 IMPLANT
SPECIMEN JAR SMALL (MISCELLANEOUS) ×3 IMPLANT
SUT MNCRL AB 4-0 PS2 18 (SUTURE) ×3 IMPLANT
TOWEL OR 17X24 6PK STRL BLUE (TOWEL DISPOSABLE) IMPLANT
TOWEL OR 17X26 10 PK STRL BLUE (TOWEL DISPOSABLE) ×3 IMPLANT
TRAY LAPAROSCOPIC MC (CUSTOM PROCEDURE TRAY) ×3 IMPLANT
TROCAR XCEL BLUNT TIP 100MML (ENDOMECHANICALS) ×3 IMPLANT
TROCAR XCEL NON-BLD 11X100MML (ENDOMECHANICALS) ×3 IMPLANT
TROCAR XCEL NON-BLD 5MMX100MML (ENDOMECHANICALS) ×3 IMPLANT
TUBING INSUFFLATION (TUBING) ×3 IMPLANT

## 2015-08-03 NOTE — Anesthesia Preprocedure Evaluation (Addendum)
Anesthesia Evaluation  Patient identified by MRN, date of birth, ID band Patient awake    Reviewed: Allergy & Precautions, H&P , NPO status , Patient's Chart, lab work & pertinent test results, reviewed documented beta blocker date and time   Airway Mallampati: II  TM Distance: >3 FB Neck ROM: Full    Dental no notable dental hx. (+) Upper Dentures, Partial Lower, Dental Advisory Given   Pulmonary asthma , COPDCurrent Smoker,  breath sounds clear to auscultation  Pulmonary exam normal       Cardiovascular hypertension, Pt. on medications and Pt. on home beta blockers + CAD, + Past MI and + Peripheral Vascular Disease Rhythm:Regular Rate:Normal     Neuro/Psych TIACVA negative psych ROS   GI/Hepatic Neg liver ROS, GERD-  Medicated and Controlled,  Endo/Other  negative endocrine ROS  Renal/GU negative Renal ROS  negative genitourinary   Musculoskeletal  (+) Arthritis -, Osteoarthritis,    Abdominal   Peds  Hematology negative hematology ROS (+)   Anesthesia Other Findings   Reproductive/Obstetrics negative OB ROS                            Anesthesia Physical Anesthesia Plan  ASA: III  Anesthesia Plan: General   Post-op Pain Management:    Induction: Intravenous  Airway Management Planned: Oral ETT  Additional Equipment:   Intra-op Plan:   Post-operative Plan: Extubation in OR  Informed Consent: I have reviewed the patients History and Physical, chart, labs and discussed the procedure including the risks, benefits and alternatives for the proposed anesthesia with the patient or authorized representative who has indicated his/her understanding and acceptance.   Dental advisory given  Plan Discussed with: CRNA  Anesthesia Plan Comments:         Anesthesia Quick Evaluation

## 2015-08-03 NOTE — Interval H&P Note (Signed)
History and Physical Interval Note:  08/03/2015 6:27 AM  [name] [name]  has presented today for surgery, with the diagnosis of chronic cholecystitis  The various methods of treatment have been discussed with the patient and family. After consideration of risks, benefits and other options for treatment, the patient has consented to  Procedure(s): LAPAROSCOPIC POSSIBLE OPEN CHOLECYSTECTOMY WITH INTRAOPERATIVE CHOLANGIOGRAM (N/A) as a surgical intervention .  The patient's history has been reviewed, patient examined, no change in status, stable for surgery.  I have reviewed the patient's chart and labs.  Questions were answered to the patient's satisfaction.     [name]

## 2015-08-03 NOTE — Anesthesia Postprocedure Evaluation (Signed)
  Anesthesia Post-op Note  Patient: [name] [name]  Procedure(s) Performed: Procedure(s): LAPAROSCOPIC CHOLECYSTECTOMY WITH INTRAOPERATIVE CHOLANGIOGRAM (N/A)  Patient Location: PACU  Anesthesia Type:General  Level of Consciousness: awake and alert   Airway and Oxygen Therapy: Patient Spontanous Breathing  Post-op Pain: Controlled  Post-op Assessment: Post-op Vital signs reviewed, Patient's Cardiovascular Status Stable and Respiratory Function Stable  Post-op Vital Signs: Reviewed  Filed Vitals:   08/03/15 0911  BP: 104/63  Pulse: 57  Temp:   Resp: 13    Complications: No apparent anesthesia complications

## 2015-08-03 NOTE — Anesthesia Procedure Notes (Signed)
Procedure Name: Intubation Date/Time: 08/03/2015 7:34 AM Performed by: Jenne Campus Pre-anesthesia Checklist: Patient identified, Emergency Drugs available, Suction available, Patient being monitored and Timeout performed Patient Re-evaluated:Patient Re-evaluated prior to inductionOxygen Delivery Method: Circle system utilized Preoxygenation: Pre-oxygenation with 100% oxygen Intubation Type: IV induction Ventilation: Mask ventilation without difficulty Laryngoscope Size: [name] and 2 Grade View: Grade I Tube type: Oral Tube size: 7.5 mm Number of attempts: 1 Airway Equipment and Method: Stylet and LTA kit utilized Placement Confirmation: ETT inserted through vocal cords under direct vision,  positive ETCO2,  CO2 detector and breath sounds checked- equal and bilateral Secured at: 22 cm Tube secured with: Tape Dental Injury: Teeth and Oropharynx as per pre-operative assessment

## 2015-08-03 NOTE — Transfer of Care (Signed)
Immediate Anesthesia Transfer of Care Note  Patient: [name] [name]  Procedure(s) Performed: Procedure(s): LAPAROSCOPIC CHOLECYSTECTOMY WITH INTRAOPERATIVE CHOLANGIOGRAM (N/A)  Patient Location: PACU  Anesthesia Type:General  Level of Consciousness: awake, oriented and patient cooperative  Airway & Oxygen Therapy: Patient Spontanous Breathing and Patient connected to nasal cannula oxygen  Post-op Assessment: Report given to RN and Post -op Vital signs reviewed and stable  Post vital signs: Reviewed  Last Vitals:  Filed Vitals:   08/03/15 0855  BP:   Pulse:   Temp: 36.4 C  Resp:     Complications: No apparent anesthesia complications

## 2015-08-03 NOTE — Op Note (Signed)
Patient Name:           [name] [name]   Date of Surgery:        08/03/2015  Pre op Diagnosis:      Chronic cholecystitis with cholelithiasis   Post op Diagnosis:    Same  Procedure:                 Laparoscopic cholecystectomy with cholangiogram  Surgeon:                     Edsel Petrin. Dalbert Batman, M.D., FACS  Assistant:                      Sharyn Dross, RNFA  Operative Indications:   The patient is a 54 year old male who presents with a complaint of chronic cholecystitis. This gentleman returns for further discussion of his biliary tract disease.      He went ahead with the colonoscopy with Dr. Carlean Purl, and only benign polyps were found. Nothing to account for his pain. Hepatobiliary scan showed that the gallbladder did fill but had delayed emptying. He had significant right upper quadrant pain with the CCK infusion. I told him this made it more likely that the gallbladder was the cause of his problems. Recall that he had an ultrasound in May that showed calcifications in the wall of the gallbladder fundus but no stone or polyps. He has Barrett's esophagitis. He continues to have right upper quadrant pain intermittently. He has seen Dr. Donavan Burnet who told him that he did not think that his abdominal pain was due to mesenteric ischemia, even though CT angiogram showed peripheral vascular disease, luminal narrowing, possible vasculitis and renal artery stenosis. Comorbidities include recovering alcoholic, COPD, active tobacco use and asked him to stop. Hypertension. NASH, gout, lymphocytic colitis, Barrett's esophagus.    He wants to go ahead with the cholecystectomy in the near future. He will be scheduled for laparoscopic cholecystectomy with cholangiogram, possible open. Marland Kitchen  Operative Findings:       The gallbladder was thin-walled, chronically inflamed, discolored, moderate adhesions, poor tissue integrity.  The cholangiogram was normal, showing normal intrahepatic and  extrahepatic biliary anatomy, no filling defect, and obstruction with good flow of contrast into the duodenum.  The liver looked healthy.  Stomach, duodenum, small intestine, and large intestine were grossly normal to inspection.  Procedure in Detail:          Following the induction of general endotracheal anesthesia the patient's abdomen was prepped and draped in a sterile fashion.  Intravenous antibiotic's were given.  Surgical timeout was performed.  0.5% Marcaine with epinephrine was used as local infiltration anesthesia.  A vertical incision was made at the superior rim of the umbilicus.  The fascia was incised in the midline and the abdominal cavity entered under direct vision.  An 11 mm Hassan trocar was inserted and secured with the Purstring suture of 0 Vicryl.  Pneumoperitoneum was created.  Video camera was inserted.  Trocar was placed in the subxiphoid region and 2 trochars placed in the right upper quadrant.     The gallbladder fundus was identified and elevated.  Adhesions were taken down.  We dissected the peritoneum off of the neck of the gallbladder and identified the cystic artery and cystic duct.  We created a large window behind these structures with good critical view.  Cystic artery was controlled with metal clips and divided.  After isolating  the cystic duct  we inserted a cholangiogram catheter and performed a cholangiogram using the C-arm with findings as described above.  After removing the cholangiocatheter we secured the cystic duct with multiple medical clips and divided it.     We then dissected the gallbladder from its bed with electrocautery, placed it in a specimen bag and removed it.  We had made one hole in the gallbladder and spilled some bile but we did not see any stones.  We copiously irrigated the operative field both subhepatic and subphrenic spaces until the irrigation fluid was completely clear.  There was no bleeding and no bile leak.  The trochars were removed under  direct vision and there was no bleeding.  The pneumoperitoneum was released.  The fascia at the umbilicus was closed with 0 Vicryl sutures.  The skin incisions were closed with subcuticular sutures of 4-0 Monocryl and Dermabond.  The patient tolerated the procedure well was taken to PACU in stable condition.  EBL 10 mL.  Counts correct.  Complications none.     Edsel Petrin. Dalbert Batman, M.D., FACS General and Minimally Invasive Surgery Breast and Colorectal Surgery  08/03/2015 8:35 AM

## 2015-08-03 NOTE — Discharge Instructions (Signed)
[facility] ______[facility] °LAPAROSCOPIC SURGERY: POST OP INSTRUCTIONS °Always review your discharge instruction sheet given to you by the facility where your surgery was performed. °IF YOU HAVE DISABILITY OR FAMILY LEAVE FORMS, YOU MUST BRING THEM TO THE OFFICE FOR PROCESSING.   °DO NOT GIVE THEM TO YOUR DOCTOR. ° °1. A prescription for pain medication may be given to you upon discharge.  Take your pain medication as prescribed, if needed.  If narcotic pain medicine is not needed, then you may take acetaminophen (Tylenol) or ibuprofen (Advil) as needed. °2. Take your usually prescribed medications unless otherwise directed. °3. If you need a refill on your pain medication, please contact your pharmacy.  They will contact our office to request authorization. Prescriptions will not be filled after 5pm or on week-ends. °4. You should follow a light diet the first few days after arrival home, such as soup and crackers, etc.  Be sure to include lots of fluids daily. °5. Most patients will experience some swelling and bruising in the area of the incisions.  Ice packs will help.  Swelling and bruising can take several days to resolve.  °6. It is common to experience some constipation if taking pain medication after surgery.  Increasing fluid intake and taking a stool softener (such as Colace) will usually help or prevent this problem from occurring.  A mild laxative (Milk of Magnesia or Miralax) should be taken according to package instructions if there are no bowel movements after 48 hours. °7. Unless discharge instructions indicate otherwise, you may remove your bandages 24-48 hours after surgery, and you may shower at that time.  You may have steri-strips (small skin tapes) in place directly over the incision.  These strips should be left on the skin for 7-10 days.  If your surgeon used skin glue on the incision, you may shower in 24 hours.  The glue will flake off over the next 2-3 weeks.  Any sutures or  staples will be removed at the office during your follow-up visit. °8. ACTIVITIES:  You may resume regular (light) daily activities beginning the next day--such as daily self-care, walking, climbing stairs--gradually increasing activities as tolerated.  You may have sexual intercourse when it is comfortable.  Refrain from any heavy lifting or straining until approved by your doctor. °a. You may drive when you are no longer taking prescription pain medication, you can comfortably wear a seatbelt, and you can safely maneuver your car and apply brakes. °b. RETURN TO WORK:  __________________________________________________________ °9. You should see your doctor in the office for a follow-up appointment approximately 2-3 weeks after your surgery.  Make sure that you call for this appointment within a day or two after you arrive home to insure a convenient appointment time. °10. OTHER INSTRUCTIONS: __________________________________________________________________________________________________________________________ __________________________________________________________________________________________________________________________ °WHEN TO CALL YOUR DOCTOR: °1. Fever over 101.0 °2. Inability to urinate °3. Continued bleeding from incision. °4. Increased pain, redness, or drainage from the incision. °5. Increasing abdominal pain ° °The clinic staff is available to answer your questions during regular business hours.  Please don’t hesitate to call and ask to speak to one of the nurses for clinical concerns.  If you have a medical emergency, go to the nearest emergency room or call 911.  A surgeon from [facility] is always on call at the hospital. °[address], [address], [place], [place]  [address] ? [address] [address], [place], [place]   [address] °[phone] ? [phone] ? FAX [phone] °Web site:   [email or web address] °

## 2015-08-04 DIAGNOSIS — I10 Essential (primary) hypertension: Secondary | ICD-10-CM | POA: Diagnosis not present

## 2015-08-04 DIAGNOSIS — K8010 Calculus of gallbladder with chronic cholecystitis without obstruction: Secondary | ICD-10-CM | POA: Diagnosis not present

## 2015-08-04 DIAGNOSIS — J449 Chronic obstructive pulmonary disease, unspecified: Secondary | ICD-10-CM | POA: Diagnosis not present

## 2015-08-04 MED ORDER — HYDROCODONE-ACETAMINOPHEN 5-325 MG PO TABS: 1.0000 | ORAL_TABLET | Freq: Four times a day (QID) | ORAL | Status: DC | PRN

## 2015-08-04 MED ORDER — DOCUSATE SODIUM 100 MG PO CAPS: 100.0000 mg | ORAL_CAPSULE | Freq: Two times a day (BID) | ORAL | Status: DC | PRN

## 2015-08-04 NOTE — Progress Notes (Signed)
[facility] Progress Note  1 Day Post-Op  Subjective: Pt feels great.  No N/V, pain minimal.  Tolerating diet well.  Ambulating through halls.  Has clothes on and is ready to go home.  Daughter at bedside will be helping to take care of him.    Objective: Vital signs in last 24 hours: Temp:  [97.5 F (36.4 C)-98.3 F (36.8 C)] 98 F (36.7 C) (08/06 0526) Pulse Rate:  [54-60] 55 (08/06 0526) Resp:  [15-18] 18 (08/06 0526) BP: (116-137)/(58-76) 137/69 mmHg (08/06 0526) SpO2:  [96 %-98 %] 96 % (08/06 0526) Last BM Date: 08/03/15  Intake/Output from previous day: 08/05 0701 - 08/06 0700 In: 5691.7 [P.O.:2340; I.V.:3351.7] Out: 2060 [Urine:2060] Intake/Output this shift:    PE: Gen:  Alert, NAD, pleasant Card:  RRR, no M/G/R heard Pulm:  CTA, no W/R/R, IS up to 2500 Abd: Soft, NT/ND, +BS, no HSM, incisions C/D/I   Lab Results:   Recent Labs  08/03/15 0608  WBC 7.4  HGB 15.5  HCT 45.0  PLT 187   BMET  Recent Labs  08/03/15 0608  NA 139  K 4.0  CL 106  CO2 23  GLUCOSE 105*  BUN 10  CREATININE 0.90  CALCIUM 9.5   PT/INR No results for input(s): LABPROT, INR in the last 72 hours. CMP     Component Value Date/Time   NA 139 08/03/2015 0608   K 4.0 08/03/2015 0608   CL 106 08/03/2015 0608   CO2 23 08/03/2015 0608   GLUCOSE 105* 08/03/2015 0608   BUN 10 08/03/2015 0608   CREATININE 0.90 08/03/2015 0608   CALCIUM 9.5 08/03/2015 0608   PROT 6.5 08/03/2015 0608   ALBUMIN 4.1 08/03/2015 0608   AST 35 08/03/2015 0608   ALT 41 08/03/2015 0608   ALKPHOS 120 08/03/2015 0608   BILITOT 0.8 08/03/2015 0608   GFRNONAA >60 08/03/2015 0608   GFRAA >60 08/03/2015 0608   Lipase     Component Value Date/Time   LIPASE 25 06/18/2015 1722       Studies/Results: Dg Cholangiogram Operative  08/03/2015   CLINICAL DATA:  Chronic cholecystitis  EXAM: INTRAOPERATIVE CHOLANGIOGRAM  TECHNIQUE: Cholangiographic images from the C-arm fluoroscopic device were  submitted for interpretation post-operatively. Please see the procedural report for the amount of contrast and the fluoroscopy time utilized.  COMPARISON:  05/21/2015, 06/20/2015  FINDINGS: Intraoperative cholangiogram performed during the laparoscopic cholecystectomy. The cystic duct, biliary confluence, common hepatic duct, and common bile duct are patent. Contrast drains into the duodenum. No dilatation, obstruction or filling defect.  IMPRESSION: Patent biliary system.   Electronically Signed   By: Jerilynn Mages.  Shick M.D.   On: 08/03/2015 08:27    Anti-infectives: Anti-infectives    Start     Dose/Rate Route Frequency Ordered Stop   08/03/15 0600  ciprofloxacin (CIPRO) IVPB 400 mg     400 mg 200 mL/hr over 60 Minutes Intravenous On call to O.R. 08/02/15 1413 08/03/15 0825       Assessment/Plan Chronic cholecystitis S/p elective Lap chole with NEG IOC  -D/c home today, he is doing well tolerating a diet, ambulating well, IS up to 2500, No N/V, good flatus -Call office to arrange follow up with Dr. Dalbert Batman if not already obtained.         Nat Christen 08/04/2015, 11:17 AM Pager: [phone]

## 2015-08-04 NOTE — Progress Notes (Signed)
Pt discharged to home accomp by family.  Rx given and explained for Vicodin.  DC instructions given and explained.  Pt has follow up appt with Dr. Dalbert Batman.

## 2015-08-06 ENCOUNTER — Encounter (HOSPITAL_COMMUNITY): Payer: Self-pay | Admitting: General Surgery

## 2015-08-06 IMAGING — RF DG CHOLANGIOGRAM OPERATIVE
1 series · 4 of 4 positions shown · non-contrast
Comparison: 05/21/2015, 06/20/2015

CLINICAL DATA: Chronic cholecystitis

EXAM:
INTRAOPERATIVE CHOLANGIOGRAM
TECHNIQUE: Cholangiographic images from the C-arm fluoroscopic device were
submitted for interpretation post-operatively. Please see the
procedural report for the amount of contrast and the fluoroscopy
time utilized.

[Series 1: run · 4 of 118 frames shown]
[frame 18/118]
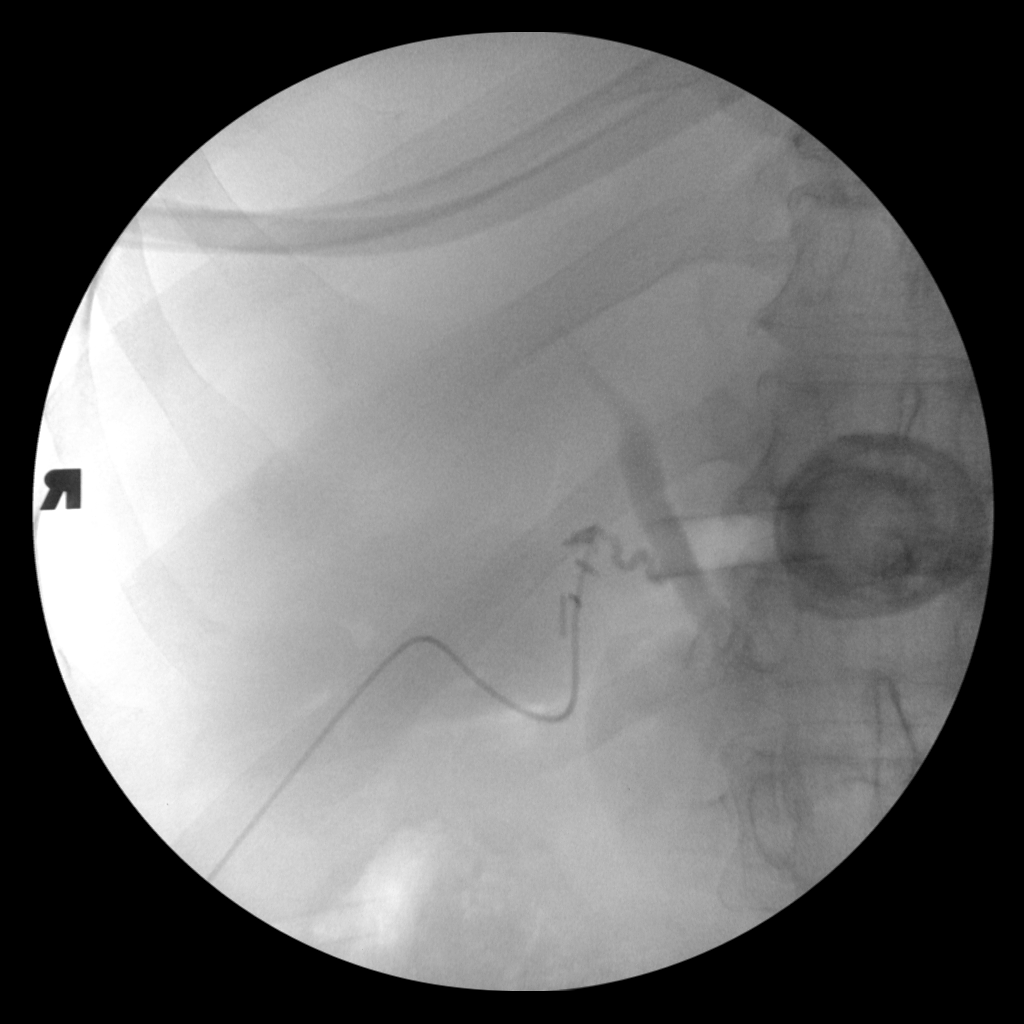
[frame 60/118]
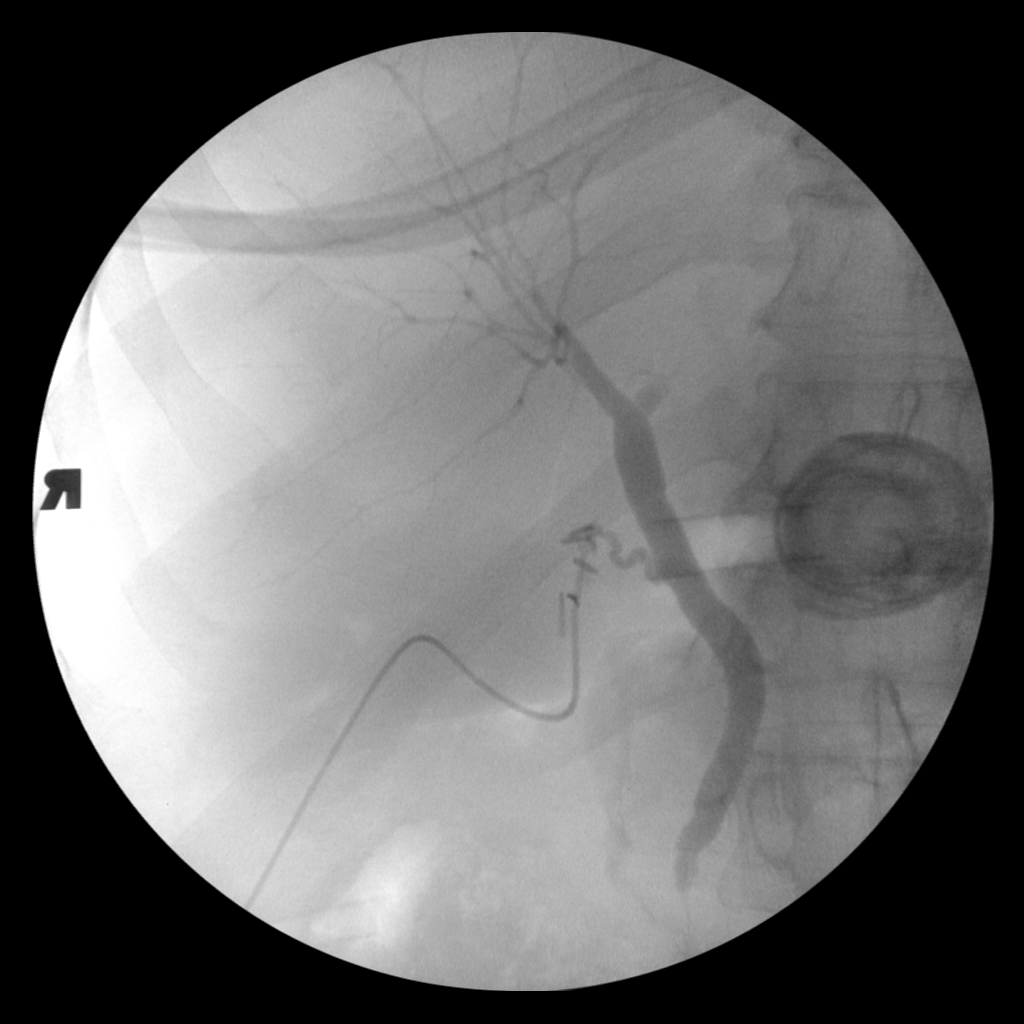
[frame 101/118]
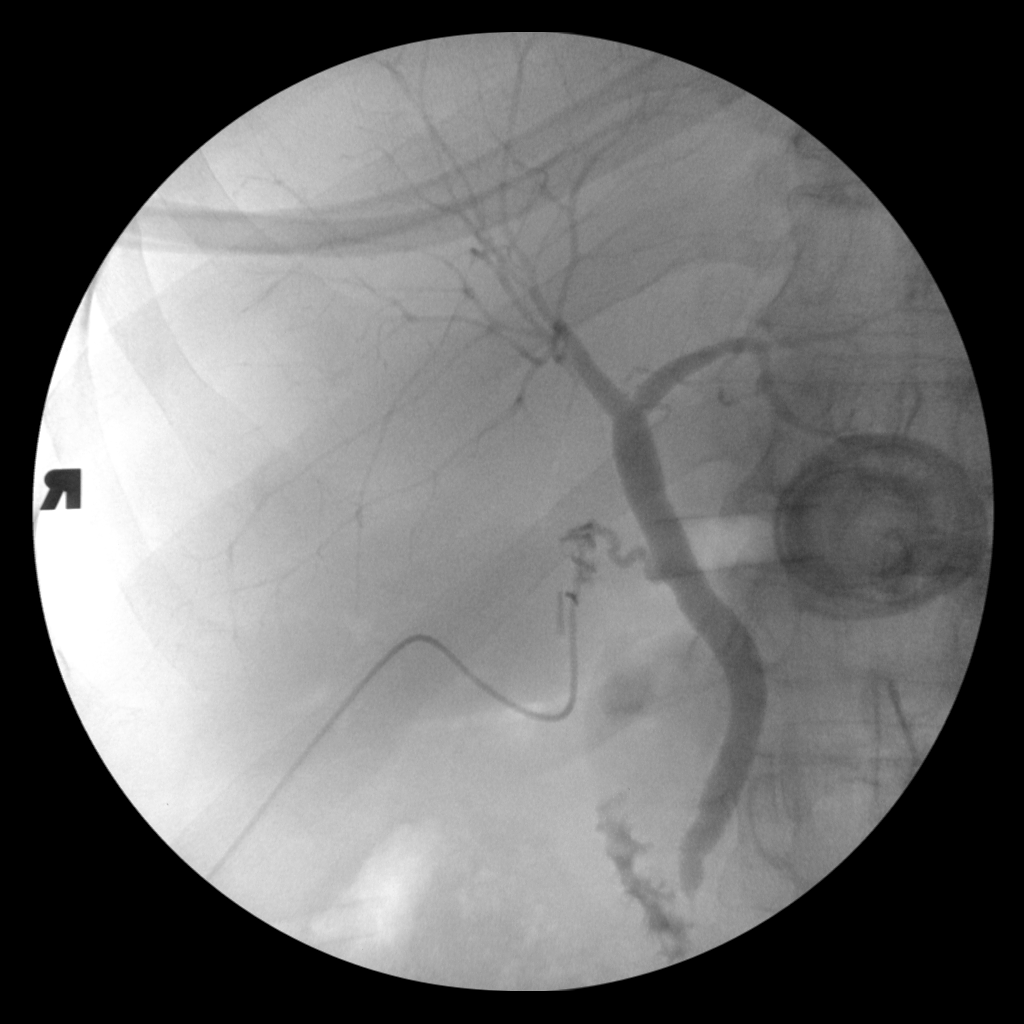
[frame 118/118]
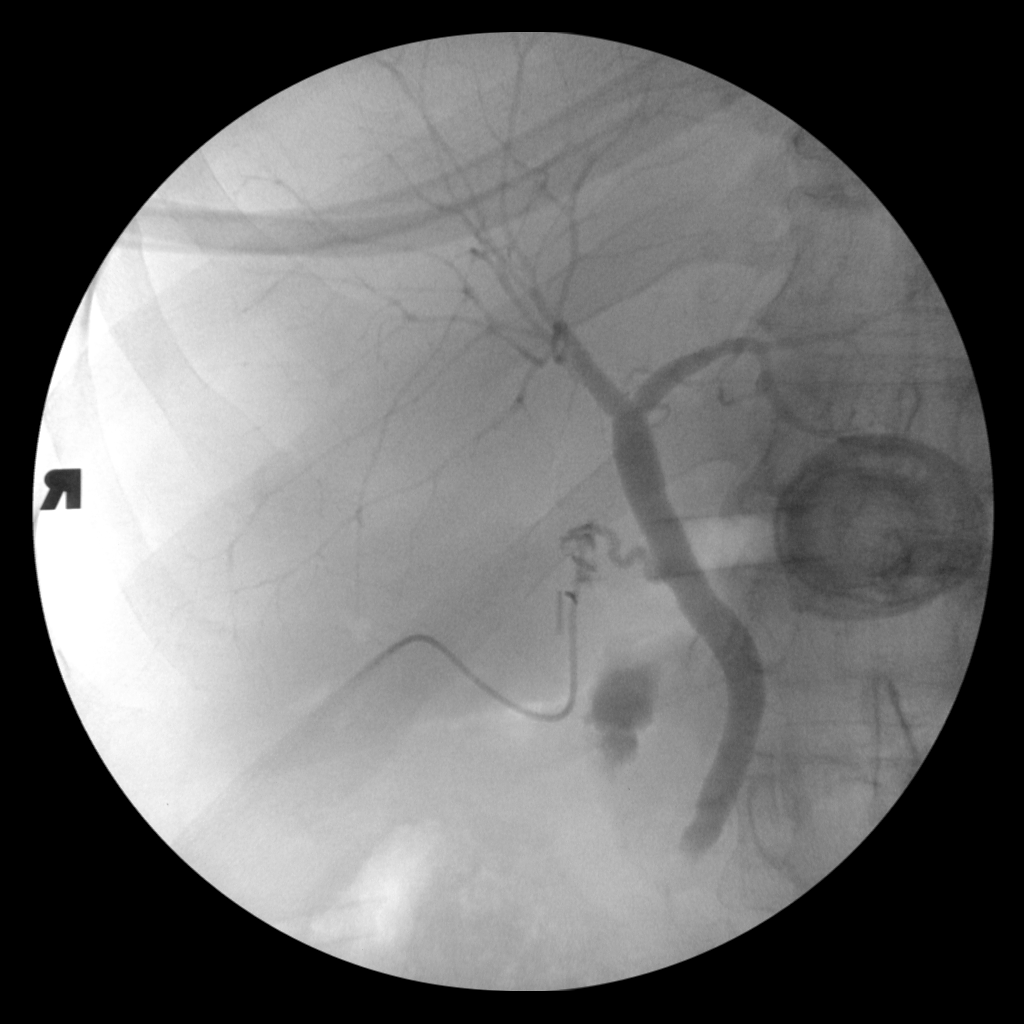

[4 of 4 positions shown; findings below may reference images not displayed]

FINDINGS: Intraoperative cholangiogram performed during the laparoscopic
cholecystectomy. The cystic duct, biliary confluence, common hepatic
duct, and common bile duct are patent. Contrast drains into the
duodenum. No dilatation, obstruction or filling defect.
IMPRESSION: Patent biliary system.

## 2015-09-18 ENCOUNTER — Ambulatory Visit
Admission: RE | Admit: 2015-09-18 | Discharge: 2015-09-18 | Disposition: A | Discharge status: Home / Self Care | Payer: Medicare Other | Source: Ambulatory Visit | Attending: Family Medicine | Admitting: Family Medicine

## 2015-09-18 ENCOUNTER — Other Ambulatory Visit: Payer: Medicare Other

## 2015-09-18 DIAGNOSIS — N281 Cyst of kidney, acquired: Secondary | ICD-10-CM

## 2015-09-21 IMAGING — US US RENAL
1 series · 14 of 25 positions shown · non-contrast
Comparison: CT abdomen pelvis of 03/27/2015, and ultrasound of the
abdomen of 05/21/2015

CLINICAL DATA: Followup renal cyst

EXAM:
RENAL / URINARY TRACT ULTRASOUND COMPLETE

[Series 1: us renal · 0.28mm/px · 14 of 38 slices shown]
[im 1/38]
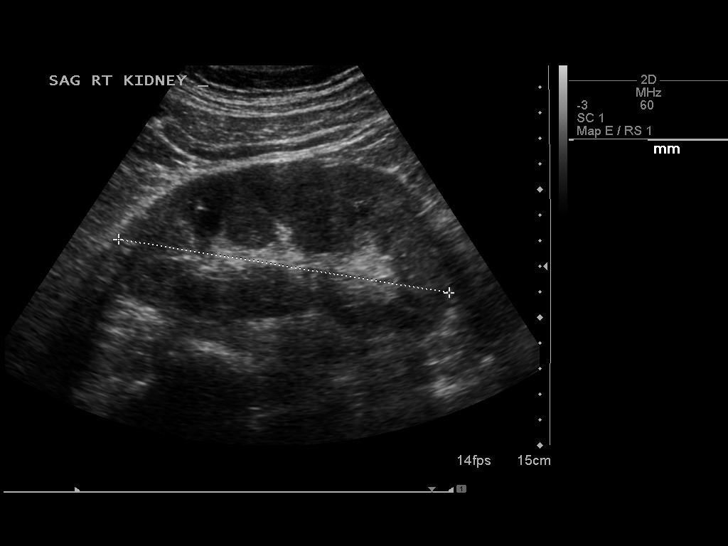
[im 4/38]
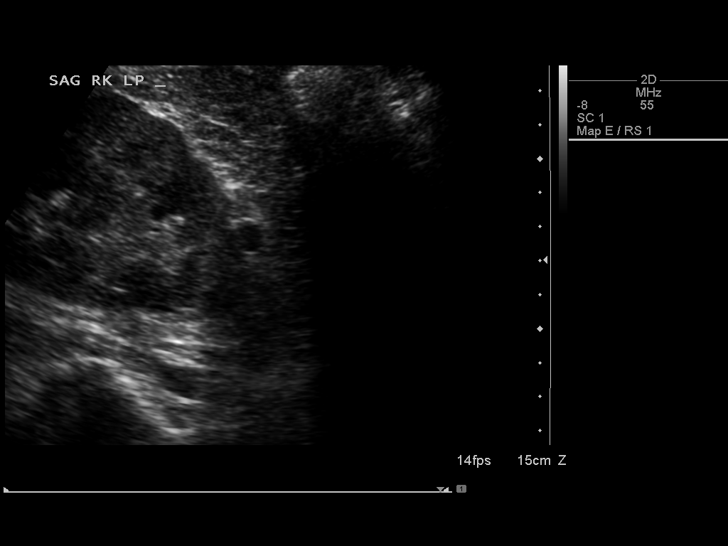
[im 7/38]
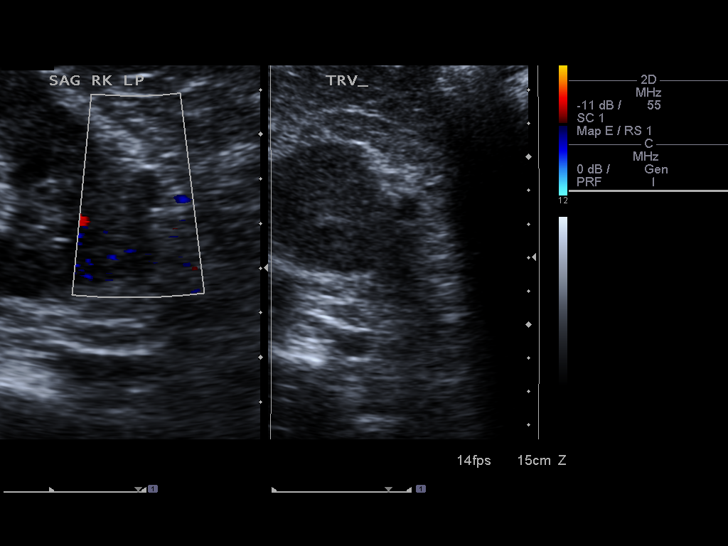
[im 10/38]
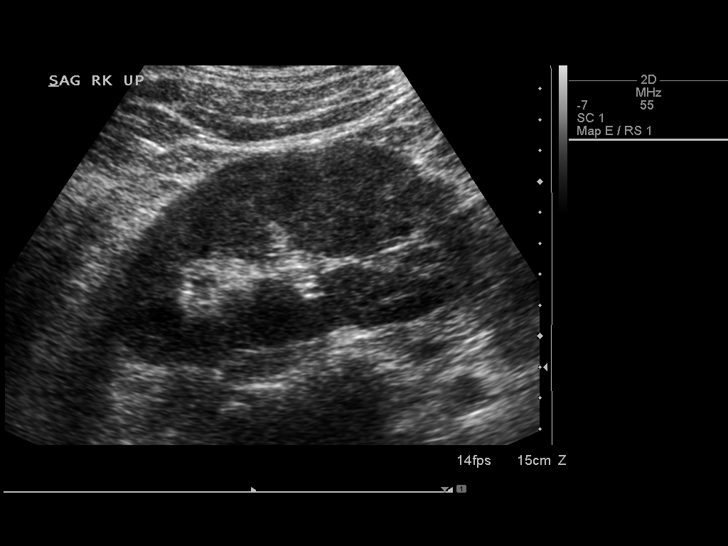
[im 13/38]
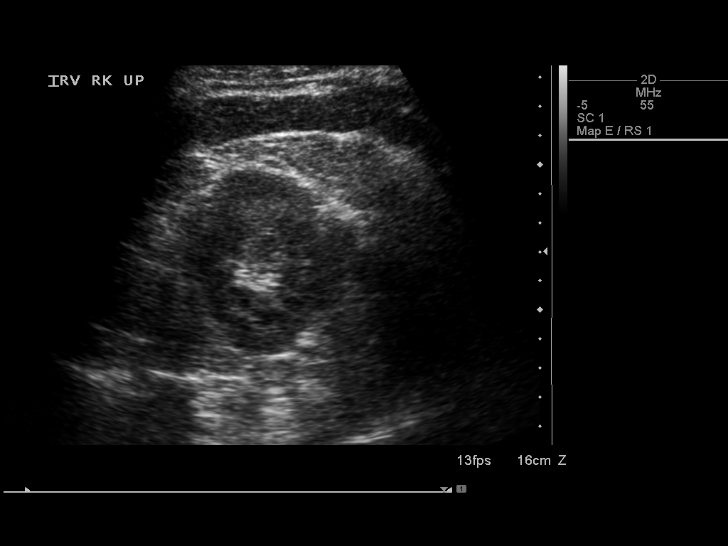
[im 14/38]
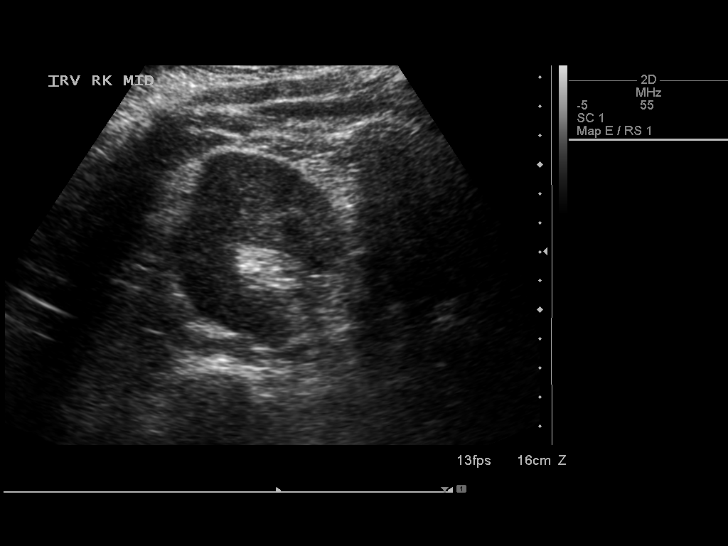
[im 17/38]
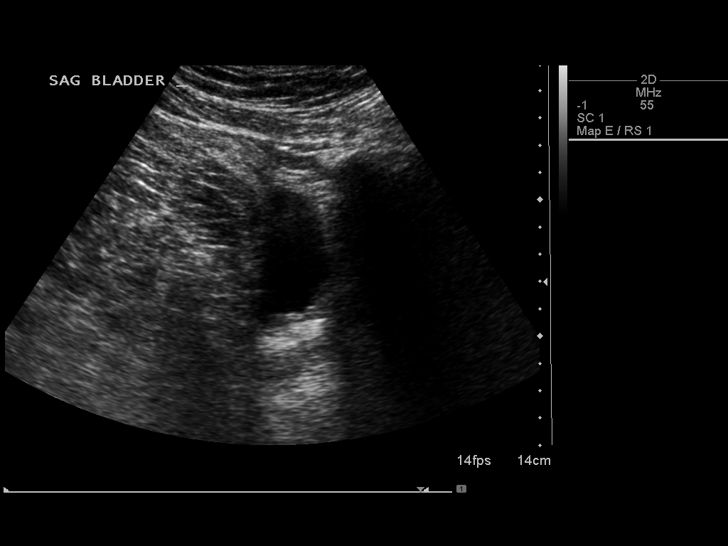
[im 21/38]
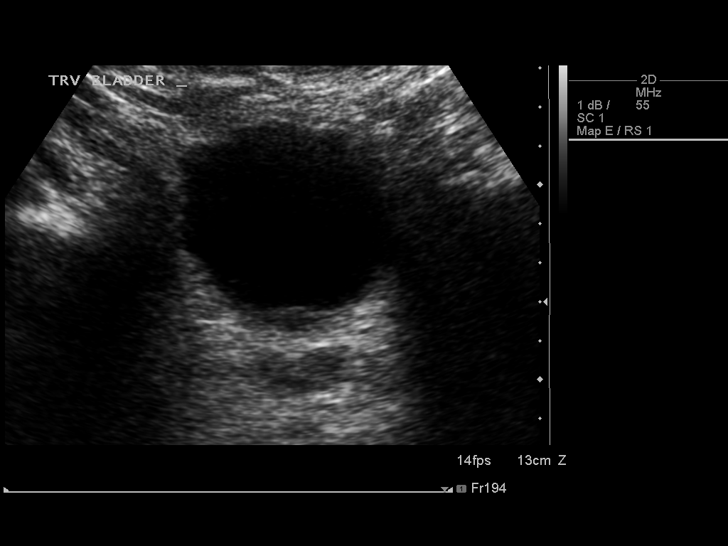
[im 24/38]
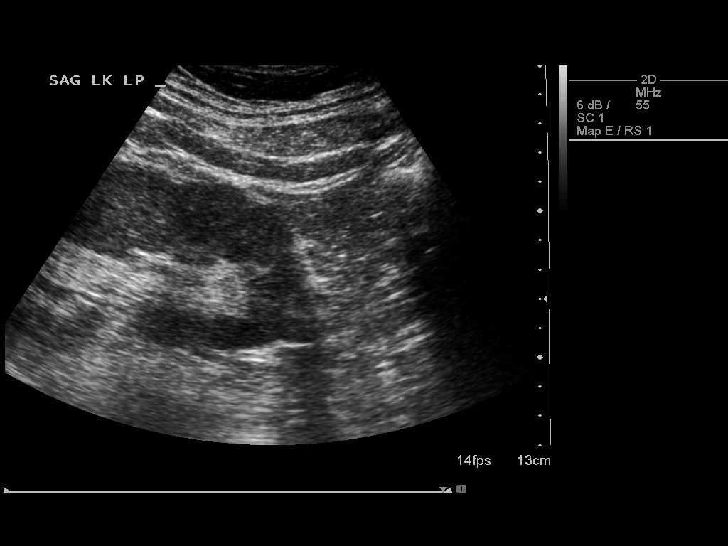
[im 25/38]
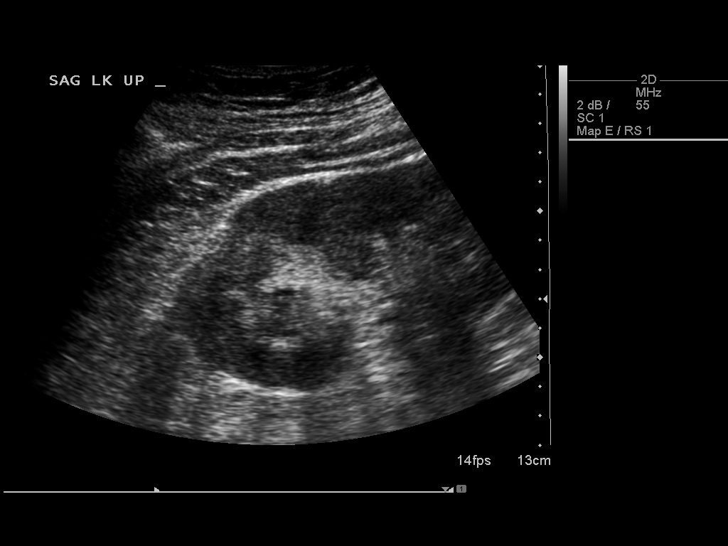
[im 28/38]
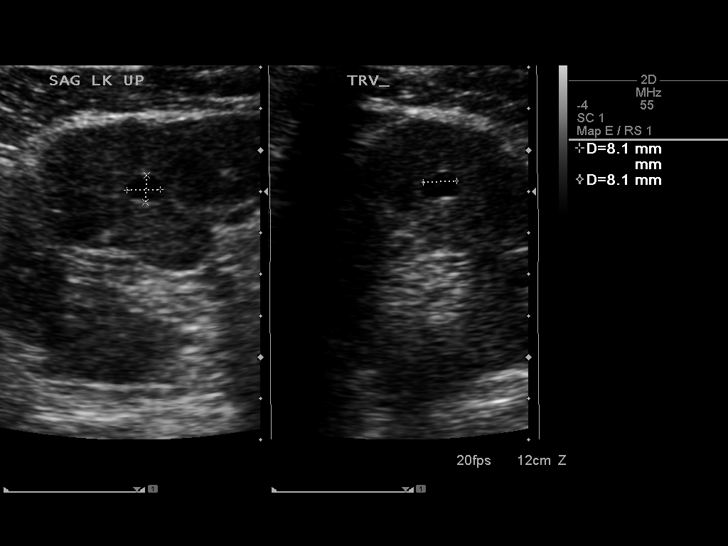
[im 31/38]
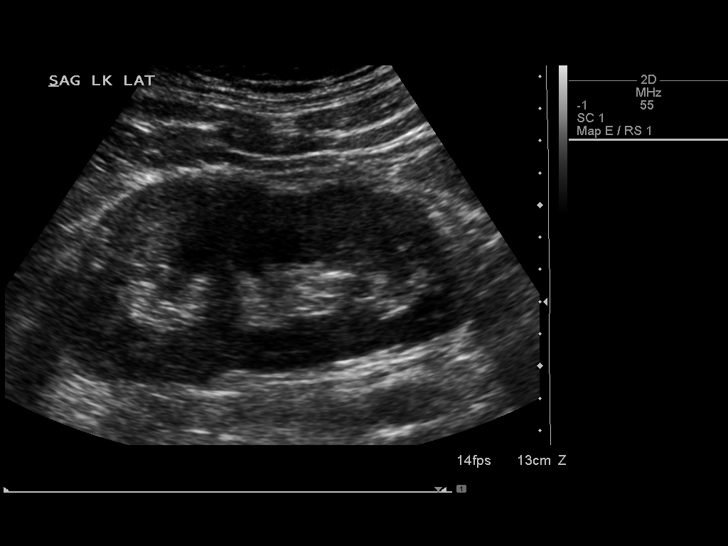
[im 34/38]
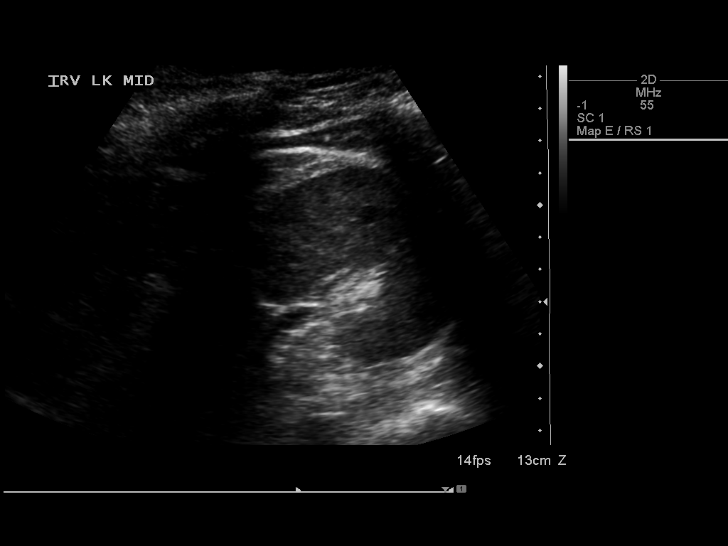
[im 38/38]
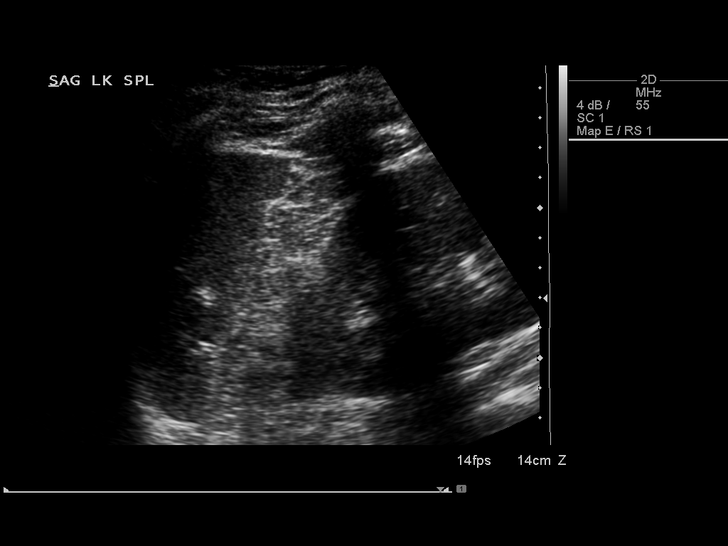

[14 of 25 positions shown; findings below may reference images not displayed]

FINDINGS: Right Kidney:

Length: 13.1 cm.. No hydronephrosis is seen a tiny cyst is noted in
the lower pole of 8 x 10 x 8 mm.

Left Kidney:

Length: 12.5 cm.. No hydronephrosis is seen. A small cyst is noted
in the upper pole of 8 x 7 x 8 mm.

Bladder:

The urinary bladder is not well distended but no intraluminal
abnormality is seen.
IMPRESSION: No hydronephrosis.  Tiny renal cysts bilaterally.

## 2015-10-16 ENCOUNTER — Ambulatory Visit
Admission: RE | Admit: 2015-10-16 | Discharge: 2015-10-16 | Disposition: A | Discharge status: Home / Self Care | Payer: Medicare Other | Source: Ambulatory Visit | Attending: Vascular Surgery | Admitting: Vascular Surgery

## 2015-10-16 DIAGNOSIS — I771 Stricture of artery: Secondary | ICD-10-CM

## 2015-10-16 DIAGNOSIS — R1011 Right upper quadrant pain: Secondary | ICD-10-CM

## 2015-10-16 DIAGNOSIS — I774 Celiac artery compression syndrome: Secondary | ICD-10-CM | POA: Diagnosis not present

## 2015-10-16 MED ORDER — IOPAMIDOL (ISOVUE-370) INJECTION 76%
75.0000 mL | Freq: Once | INTRAVENOUS | Status: AC | PRN
  Administered 2015-10-16: 75 mL via INTRAVENOUS

## 2015-10-19 ENCOUNTER — Encounter: Payer: Self-pay | Admitting: Vascular Surgery

## 2015-10-23 ENCOUNTER — Ambulatory Visit: Payer: Medicare Other | Admitting: Vascular Surgery

## 2015-11-02 ENCOUNTER — Emergency Department (HOSPITAL_COMMUNITY): Payer: Medicare Other

## 2015-11-02 ENCOUNTER — Other Ambulatory Visit: Payer: Self-pay

## 2015-11-02 ENCOUNTER — Emergency Department (HOSPITAL_COMMUNITY)
Admission: EM | Admit: 2015-11-02 | Discharge: 2015-11-02 | Disposition: A | Discharge status: Home / Self Care | Payer: Medicare Other | Attending: Emergency Medicine | Admitting: Emergency Medicine

## 2015-11-02 ENCOUNTER — Encounter (HOSPITAL_COMMUNITY): Payer: Self-pay | Admitting: Family Medicine

## 2015-11-02 DIAGNOSIS — Z8639 Personal history of other endocrine, nutritional and metabolic disease: Secondary | ICD-10-CM | POA: Insufficient documentation

## 2015-11-02 DIAGNOSIS — R079 Chest pain, unspecified: Secondary | ICD-10-CM | POA: Diagnosis not present

## 2015-11-02 DIAGNOSIS — J45901 Unspecified asthma with (acute) exacerbation: Secondary | ICD-10-CM | POA: Insufficient documentation

## 2015-11-02 DIAGNOSIS — Z791 Long term (current) use of non-steroidal anti-inflammatories (NSAID): Secondary | ICD-10-CM | POA: Diagnosis not present

## 2015-11-02 DIAGNOSIS — M109 Gout, unspecified: Secondary | ICD-10-CM | POA: Insufficient documentation

## 2015-11-02 DIAGNOSIS — G8929 Other chronic pain: Secondary | ICD-10-CM | POA: Diagnosis not present

## 2015-11-02 DIAGNOSIS — Z79899 Other long term (current) drug therapy: Secondary | ICD-10-CM | POA: Insufficient documentation

## 2015-11-02 DIAGNOSIS — I252 Old myocardial infarction: Secondary | ICD-10-CM | POA: Insufficient documentation

## 2015-11-02 DIAGNOSIS — K219 Gastro-esophageal reflux disease without esophagitis: Secondary | ICD-10-CM | POA: Diagnosis not present

## 2015-11-02 DIAGNOSIS — Z880 Allergy status to penicillin: Secondary | ICD-10-CM | POA: Insufficient documentation

## 2015-11-02 DIAGNOSIS — Z720 Tobacco use: Secondary | ICD-10-CM | POA: Diagnosis not present

## 2015-11-02 DIAGNOSIS — R0602 Shortness of breath: Secondary | ICD-10-CM | POA: Diagnosis not present

## 2015-11-02 DIAGNOSIS — J449 Chronic obstructive pulmonary disease, unspecified: Secondary | ICD-10-CM | POA: Diagnosis not present

## 2015-11-02 DIAGNOSIS — M47819 Spondylosis without myelopathy or radiculopathy, site unspecified: Secondary | ICD-10-CM | POA: Diagnosis not present

## 2015-11-02 DIAGNOSIS — Z9889 Other specified postprocedural states: Secondary | ICD-10-CM | POA: Insufficient documentation

## 2015-11-02 DIAGNOSIS — Z86010 Personal history of colonic polyps: Secondary | ICD-10-CM | POA: Insufficient documentation

## 2015-11-02 DIAGNOSIS — R42 Dizziness and giddiness: Secondary | ICD-10-CM | POA: Insufficient documentation

## 2015-11-02 DIAGNOSIS — Z8673 Personal history of transient ischemic attack (TIA), and cerebral infarction without residual deficits: Secondary | ICD-10-CM | POA: Insufficient documentation

## 2015-11-02 DIAGNOSIS — I10 Essential (primary) hypertension: Secondary | ICD-10-CM | POA: Diagnosis not present

## 2015-11-02 DIAGNOSIS — R0789 Other chest pain: Secondary | ICD-10-CM | POA: Diagnosis not present

## 2015-11-02 DIAGNOSIS — Z87442 Personal history of urinary calculi: Secondary | ICD-10-CM | POA: Insufficient documentation

## 2015-11-02 DIAGNOSIS — I2510 Atherosclerotic heart disease of native coronary artery without angina pectoris: Secondary | ICD-10-CM | POA: Insufficient documentation

## 2015-11-02 LAB — BASIC METABOLIC PANEL
Anion gap: 8 (ref 5–15)
BUN: 10 mg/dL (ref 6–20)
CO2: 25 mmol/L (ref 22–32)
CREATININE: 0.78 mg/dL (ref 0.61–1.24)
Calcium: 10 mg/dL (ref 8.9–10.3)
Chloride: 105 mmol/L (ref 101–111)
GFR calc Af Amer: >60 mL/min (ref >60)
GFR calc non Af Amer: >60 mL/min (ref >60)
Glucose, Bld: 106 mg/dL — ABNORMAL HIGH (ref 65–99)
Potassium: 4 mmol/L (ref 3.5–5.1)
Sodium: 138 mmol/L (ref 135–145)

## 2015-11-02 LAB — CBC
HEMATOCRIT: 46 % (ref 39.0–52.0)
Hemoglobin: 16 g/dL (ref 13.0–17.0)
MCH: 29.9 pg (ref 26.0–34.0)
MCHC: 34.8 g/dL (ref 30.0–36.0)
MCV: 86 fL (ref 78.0–100.0)
PLATELETS: 176 10*3/uL (ref 150–400)
RBC: 5.35 MIL/uL (ref 4.22–5.81)
RDW: 12.7 % (ref 11.5–15.5)
WBC: 8.2 10*3/uL (ref 4.0–10.5)

## 2015-11-02 LAB — D-DIMER, QUANTITATIVE (NOT AT ARMC)

## 2015-11-02 LAB — I-STAT TROPONIN, ED
TROPONIN I, POC: 0 ng/mL (ref 0.00–0.08)
Troponin i, poc: 0 ng/mL (ref 0.00–0.08)

## 2015-11-02 MED ORDER — MORPHINE SULFATE (PF) 4 MG/ML IV SOLN
4.0000 mg | Freq: Once | INTRAVENOUS | Status: AC
  Administered 2015-11-02: 4 mg via INTRAVENOUS
  Filled 2015-11-02: qty 1

## 2015-11-02 MED ORDER — FAMOTIDINE 20 MG PO TABS
20.0000 mg | ORAL_TABLET | Freq: Once | ORAL | Status: AC
  Administered 2015-11-02: 20 mg via ORAL
  Filled 2015-11-02: qty 1

## 2015-11-02 MED ORDER — GI COCKTAIL ~~LOC~~
30.0000 mL | Freq: Once | ORAL | Status: AC
  Administered 2015-11-02: 30 mL via ORAL
  Filled 2015-11-02: qty 30

## 2015-11-02 NOTE — Discharge Instructions (Signed)
Continue to take aspirin. Make appointment with cardiologist next week.  If you were given medicines take as directed.  If you are on coumadin or contraceptives realize their levels and effectiveness is altered by many different medicines.  If you have any reaction (rash, tongues swelling, other) to the medicines stop taking and see a physician.    If your blood pressure was elevated in the ER make sure you follow up for management with a primary doctor or return for chest pain, shortness of breath or stroke symptoms.  Please follow up as directed and return to the ER or see a physician for new or worsening symptoms.  Thank you. Filed Vitals:   11/02/15 0945 11/02/15 1000 11/02/15 1100 11/02/15 1153  BP: 141/82 123/78 124/80 120/70  Pulse: 67 66 63 60  Temp:      TempSrc:      Resp: 16 16 18 15   SpO2: 95% 96% 95% 94%

## 2015-11-02 NOTE — ED Provider Notes (Signed)
CSN: [id number]     Arrival date & time 11/02/15  0913 History   First MD Initiated Contact with Patient 11/02/15 [phone]     Chief Complaint  Patient presents with  . Chest Pain     (Consider location/radiation/quality/duration/timing/severity/associated sxs/prior Treatment) HPI Comments: 54 year old male with history of high blood pressure, 40 years smoking, CAD, stroke, heart attack, chronic cholecystitis and surgery September 1 night stay, family history of cardiac presents with chest pain for 3 days. Worsening this morning with mild soreness of breath and lightheadedness. Patient has history of reflux. Patient says this feels burning sensation bilateral anterior chest worse in the morning. Fairly constant this morning no radiation to the arms or back. No recent exertional symptoms. Patient had myocardial perfusion study before surgery which was unremarkable. No leg swelling, no blood clot history, no active cancer. Patient did have recent surgery however only in the hospital 1 day.  Patient is a 54 y.o. male presenting with chest pain. The history is provided by the patient.  Chest Pain Associated symptoms: shortness of breath   Associated symptoms: no abdominal pain, no back pain, no fever, no headache and not vomiting     Past Medical History  Diagnosis Date  . Gout     takes ALlopurinol daily  . Adenomatous colon polyp 2009  . Lymphocytic colitis 2009  . COPD (chronic obstructive pulmonary disease) ([place])   . NASH (nonalcoholic steatohepatitis)   . Hyperlipidemia     was given a script a month ago but scared to take it.Medical Md is aware  . TIA (transient ischemic attack)   . Asthma   . Alcohol abuse     abstinent since 1992  . Hemorrhoids   . Chronic back pain   . Personal history of colonic polyps 07/20/2002    Diminutive adenomas (3) 06/2002 diminutive adenoma (1) 2011   . Peripheral vascular disease ([place])   . Barrett's esophagus 05/22/2015  . Hypertension     takes  Losartan,Metoprolol,and Amlodipine daily  . GERD (gastroesophageal reflux disease)     takes Nexium daily  . History of bronchitis 2015  . Stroke ([place])   . Arthritis     back  . Joint pain   . Urinary urgency   . History of kidney stones   . Coronary artery disease     70-80% ostial LAD '10  . Myocardial infarction [facility]) 2010    "light" (admit for CP 11/2009; ruled out for MI but had 70-80% ostail LAD stenosis and treated medically following NL perfusion study)   Past Surgical History  Procedure Laterality Date  . Lumbar spine surgery      x 2  . Nasal sinus surgery    . 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 Colonoscopy  multiple  . Band hemorrhoidectomy  2003    at sigmoidoscopy  . Tee without cardioversion  05/28/2012    Procedure: TRANSESOPHAGEAL ECHOCARDIOGRAM (TEE);  Surgeon: Lelon Perla, MD;  Location: [facility] ENDOSCOPY;  Service: Cardiovascular;  Laterality: N/A;  . Cardiac catheterization  2010  . Esophagogastroduodenoscopy    . Cholecystectomy  08/03/2015  . Cholecystectomy N/A 08/03/2015    Procedure: LAPAROSCOPIC CHOLECYSTECTOMY WITH INTRAOPERATIVE CHOLANGIOGRAM;  Surgeon: Fanny Skates, MD;  Location: [facility] OR;  Service: General;  Laterality: N/A;   Family History  Problem Relation Age of Onset  . Liver disease Mother   . Liver cancer Mother   . Breast cancer Mother   . Cancer Mother   .  Hypertension Mother   . Heart attack Mother   . Colon cancer Neg Hx   . Heart disease      multiple family members on maternal and paternal side of family  . Diabetes Sister   . Hyperlipidemia Sister   . Hypertension Sister   . Diabetes Paternal Grandmother   . Cancer Father   . Heart disease Father     before age 54  . Hyperlipidemia Father   . Hypertension Father   . Heart attack Father   . Hypertension Brother    Social History  Substance Use Topics  . Smoking status: Current Every Day Smoker -- 1.00 packs/day for 4 years    Types: Cigarettes  . Smokeless  tobacco: Never Used  . Alcohol Use: No     Comment: no alcohol in 23yr     Review of Systems  Constitutional: Negative for fever and chills.  HENT: Negative for congestion.   Eyes: Negative for visual disturbance.  Respiratory: Positive for shortness of breath.   Cardiovascular: Positive for chest pain.  Gastrointestinal: Negative for vomiting and abdominal pain.  Genitourinary: Negative for dysuria and flank pain.  Musculoskeletal: Negative for back pain, neck pain and neck stiffness.  Skin: Negative for rash.  Neurological: Positive for light-headedness. Negative for headaches.      Allergies  Penicillins  Home Medications   Prior to Admission medications   Medication Sig Start Date End Date Taking? Authorizing Provider  allopurinol (ZYLOPRIM) 100 MG tablet Take 100 mg by mouth daily.   Yes Historical Provider, MD  amLODipine (NORVASC) 10 MG tablet Take 10 mg by mouth daily.   Yes Historical Provider, MD  aspirin 325 MG tablet Take 325 mg by mouth daily.   Yes Historical Provider, MD  esomeprazole (NEXIUM) 40 MG capsule Take 40 mg by mouth 2 (two) times daily.   Yes Historical Provider, MD  losartan (COZAAR) 100 MG tablet Take 100 mg by mouth daily.   Yes Historical Provider, MD  metoprolol (LOPRESSOR) 100 MG tablet Take 100 mg by mouth 2 (two) times daily.   Yes Historical Provider, MD  Milk Thistle 500 MG CAPS Take 1,000 mg by mouth daily.    Yes Historical Provider, MD  Multiple Vitamin (MULITIVITAMIN WITH MINERALS) TABS Take 1 tablet by mouth daily.   Yes Historical Provider, MD  naproxen (NAPROSYN) 500 MG tablet Take 500 mg by mouth every 12 (twelve) hours.   Yes Historical Provider, MD  niacin (NIASPAN) 1000 MG CR tablet Take 1,000 mg by mouth at bedtime.   Yes Historical Provider, MD  Omega-3 Fatty Acids (FISH OIL) 1200 MG CPDR Take 1,200 mg by mouth daily.   Yes Historical Provider, MD  traZODone (DESYREL) 50 MG tablet Take 50 mg by mouth at bedtime.   Yes Historical  Provider, MD  docusate sodium (COLACE) 100 MG capsule Take 1 capsule (100 mg total) by mouth 2 (two) times daily as needed for mild constipation or moderate constipation. Patient not taking: Reported on 11/02/2015 08/04/15   MNat Christen PA-C  HYDROcodone-acetaminophen (NORCO/VICODIN) 5-325 MG per tablet Take 1-2 tablets by mouth every 6 (six) hours as needed for moderate pain. Patient not taking: Reported on 11/02/2015 08/04/15   MNat Christen PA-C  nitroGLYCERIN (NITROSTAT) 0.4 MG SL tablet Place 0.4 mg under the tongue every 5 (five) minutes as needed. For chest pain    Historical Provider, MD   BP 120/70 mmHg  Pulse 60  Temp(Src) 97.8 F (36.6 C) (Oral)  Resp 15  SpO2 94% Physical Exam  Constitutional: He is oriented to person, place, and time. He appears well-developed and well-nourished.  HENT:  Head: Normocephalic and atraumatic.  Eyes: Conjunctivae are normal. Right eye exhibits no discharge. Left eye exhibits no discharge.  Neck: Normal range of motion. Neck supple. No tracheal deviation present.  Cardiovascular: Normal rate and regular rhythm.   Pulmonary/Chest: Effort normal and breath sounds normal.  Abdominal: Soft. He exhibits no distension. There is no tenderness. There is no guarding.  Musculoskeletal: He exhibits no edema.  Neurological: He is alert and oriented to person, place, and time.  Skin: Skin is warm. No rash noted.  Psychiatric: He has a normal mood and affect.  Nursing note and vitals reviewed.   ED Course  Procedures (including critical care time) Labs Review Labs Reviewed  BASIC METABOLIC PANEL - Abnormal; Notable for the following:    Glucose, Bld 106 (*)    All other components within normal limits  CBC  D-DIMER, QUANTITATIVE (NOT AT [facility])  Randolm Idol, ED  Randolm Idol, ED    Imaging Review Dg Chest 2 View  11/02/2015  CLINICAL DATA:  Shortness of breath and chest pain for 1 day EXAM: CHEST  2 VIEW COMPARISON:  October 23, 2014  FINDINGS: There is no edema or consolidation. The heart size and pulmonary vascularity are normal. No adenopathy. No bone lesions. IMPRESSION: No edema or consolidation. Electronically Signed   By: Lowella Grip III M.D.   On: 11/02/2015 10:16   I have personally reviewed and evaluated these images and lab results as part of my medical decision-making.   EKG Interpretation None     Reviewed, hr 70, sinus, nl q,tno acute st findings MDM   Final diagnoses:  None   Patient presents with worsening burning chest pain, known CAD and multiple risk factors. Discussed normal recent perfusion study which is reassuring. Plan for delta troponin and close follow-up with cardiology and primary doctor. Patient low risk blood clot both recent surgery d-dimer sent.  Discussed with cardiology on the phone, greasy atypical presentation and recent normal perfusion study. Cardiology agrees with outpatient follow-up in the clinic next week. Pain improved in the ER. Chest x-ray reviewed no acute findings. D-dimer negative patient low risk. If second troponin negative patient will be discharged with continued aspirin use.  Results and differential diagnosis were discussed with the patient/parent/guardian. Xrays were independently reviewed by myself.  Close follow up outpatient was discussed, comfortable with the plan.   Medications  morphine 4 MG/ML injection 4 mg (4 mg Intravenous Given 11/02/15 0936)  famotidine (PEPCID) tablet 20 mg (20 mg Oral Given 11/02/15 1034)  gi cocktail (Maalox,Lidocaine,Donnatal) (30 mLs Oral Given 11/02/15 1034)    Filed Vitals:   11/02/15 0945 11/02/15 1000 11/02/15 1100 11/02/15 1153  BP: 141/82 123/78 124/80 120/70  Pulse: 67 66 63 60  Temp:      TempSrc:      Resp: 16 16 18 15   SpO2: 95% 96% 95% 94%    Final diagnoses:  None       Elnora Morrison, MD 11/02/15 1211

## 2015-11-02 NOTE — ED Notes (Signed)
Pt presents via POV from home with c/o Chest Pain x3 days, with today being the worst.  He reports mild SOB, mild lightheadedness, nausea, and radiation to the left arm and back. He is alert, oriented and speaking in complete sentences.

## 2015-11-02 NOTE — ED Notes (Signed)
Patient up ambulatory to the bathroom at this time without any difficulty or distress

## 2015-11-02 NOTE — ED Notes (Signed)
Dr. Reather Converse at bedside

## 2015-11-02 NOTE — ED Notes (Signed)
Pt comfortable with discharge and follow up instructions. Pt declines wheelchair, escorted to waiting area by this RN. No Prescriptions

## 2015-12-06 ENCOUNTER — Encounter: Payer: Self-pay | Admitting: Internal Medicine

## 2016-04-21 DIAGNOSIS — M109 Gout, unspecified: Secondary | ICD-10-CM | POA: Diagnosis not present

## 2016-04-21 DIAGNOSIS — T148 Other injury of unspecified body region: Secondary | ICD-10-CM | POA: Diagnosis not present

## 2016-04-21 DIAGNOSIS — Z125 Encounter for screening for malignant neoplasm of prostate: Secondary | ICD-10-CM | POA: Diagnosis not present

## 2016-04-21 DIAGNOSIS — M549 Dorsalgia, unspecified: Secondary | ICD-10-CM | POA: Diagnosis not present

## 2016-04-21 DIAGNOSIS — Z0000 Encounter for general adult medical examination without abnormal findings: Secondary | ICD-10-CM | POA: Diagnosis not present

## 2016-04-21 DIAGNOSIS — R0683 Snoring: Secondary | ICD-10-CM | POA: Diagnosis not present

## 2016-04-21 DIAGNOSIS — R739 Hyperglycemia, unspecified: Secondary | ICD-10-CM | POA: Diagnosis not present

## 2016-04-21 DIAGNOSIS — I10 Essential (primary) hypertension: Secondary | ICD-10-CM | POA: Diagnosis not present

## 2016-04-21 DIAGNOSIS — E7800 Pure hypercholesterolemia, unspecified: Secondary | ICD-10-CM | POA: Diagnosis not present

## 2016-04-21 DIAGNOSIS — K219 Gastro-esophageal reflux disease without esophagitis: Secondary | ICD-10-CM | POA: Diagnosis not present

## 2016-04-21 DIAGNOSIS — Z23 Encounter for immunization: Secondary | ICD-10-CM | POA: Diagnosis not present

## 2016-04-21 DIAGNOSIS — J4520 Mild intermittent asthma, uncomplicated: Secondary | ICD-10-CM | POA: Diagnosis not present

## 2016-05-15 DIAGNOSIS — G4733 Obstructive sleep apnea (adult) (pediatric): Secondary | ICD-10-CM | POA: Diagnosis not present

## 2016-09-09 ENCOUNTER — Emergency Department (HOSPITAL_COMMUNITY): Payer: Medicare Other

## 2016-09-09 ENCOUNTER — Emergency Department (HOSPITAL_COMMUNITY)
Admission: EM | Admit: 2016-09-09 | Discharge: 2016-09-09 | Disposition: A | Discharge status: Home / Self Care | Payer: Medicare Other | Attending: Emergency Medicine | Admitting: Emergency Medicine

## 2016-09-09 ENCOUNTER — Encounter (HOSPITAL_COMMUNITY): Payer: Self-pay | Admitting: Emergency Medicine

## 2016-09-09 DIAGNOSIS — Z8673 Personal history of transient ischemic attack (TIA), and cerebral infarction without residual deficits: Secondary | ICD-10-CM | POA: Insufficient documentation

## 2016-09-09 DIAGNOSIS — I252 Old myocardial infarction: Secondary | ICD-10-CM | POA: Insufficient documentation

## 2016-09-09 DIAGNOSIS — F17210 Nicotine dependence, cigarettes, uncomplicated: Secondary | ICD-10-CM | POA: Insufficient documentation

## 2016-09-09 DIAGNOSIS — R1031 Right lower quadrant pain: Secondary | ICD-10-CM

## 2016-09-09 DIAGNOSIS — I2510 Atherosclerotic heart disease of native coronary artery without angina pectoris: Secondary | ICD-10-CM | POA: Diagnosis not present

## 2016-09-09 DIAGNOSIS — I10 Essential (primary) hypertension: Secondary | ICD-10-CM | POA: Insufficient documentation

## 2016-09-09 DIAGNOSIS — Z7982 Long term (current) use of aspirin: Secondary | ICD-10-CM | POA: Diagnosis not present

## 2016-09-09 DIAGNOSIS — Z79899 Other long term (current) drug therapy: Secondary | ICD-10-CM | POA: Diagnosis not present

## 2016-09-09 DIAGNOSIS — J449 Chronic obstructive pulmonary disease, unspecified: Secondary | ICD-10-CM | POA: Insufficient documentation

## 2016-09-09 LAB — CBC
HEMATOCRIT: 47.6 % (ref 39.0–52.0)
HEMOGLOBIN: 16 g/dL (ref 13.0–17.0)
MCH: 30.2 pg (ref 26.0–34.0)
MCHC: 33.6 g/dL (ref 30.0–36.0)
MCV: 89.8 fL (ref 78.0–100.0)
Platelets: 189 10*3/uL (ref 150–400)
RBC: 5.3 MIL/uL (ref 4.22–5.81)
RDW: 12.4 % (ref 11.5–15.5)
WBC: 7.3 10*3/uL (ref 4.0–10.5)

## 2016-09-09 LAB — COMPREHENSIVE METABOLIC PANEL
ALBUMIN: 4.4 g/dL (ref 3.5–5.0)
ALT: 29 U/L (ref 17–63)
ANION GAP: 7 (ref 5–15)
AST: 29 U/L (ref 15–41)
Alkaline Phosphatase: 124 U/L (ref 38–126)
BUN: 10 mg/dL (ref 6–20)
CO2: 27 mmol/L (ref 22–32)
Calcium: 10.1 mg/dL (ref 8.9–10.3)
Chloride: 105 mmol/L (ref 101–111)
Creatinine, Ser: 0.66 mg/dL (ref 0.61–1.24)
GFR calc Af Amer: >60 mL/min (ref >60)
GFR calc non Af Amer: >60 mL/min (ref >60)
GLUCOSE: 114 mg/dL — AB (ref 65–99)
POTASSIUM: 4.5 mmol/L (ref 3.5–5.1)
SODIUM: 139 mmol/L (ref 135–145)
Total Bilirubin: 0.5 mg/dL (ref 0.3–1.2)
Total Protein: 6.9 g/dL (ref 6.5–8.1)

## 2016-09-09 LAB — URINALYSIS, ROUTINE W REFLEX MICROSCOPIC
BILIRUBIN URINE: NEGATIVE
GLUCOSE, UA: NEGATIVE mg/dL
HGB URINE DIPSTICK: NEGATIVE
Ketones, ur: NEGATIVE mg/dL
Leukocytes, UA: NEGATIVE
Nitrite: NEGATIVE
PH: 6.5 (ref 5.0–8.0)
Protein, ur: NEGATIVE mg/dL

## 2016-09-09 LAB — LIPASE, BLOOD: Lipase: 29 U/L (ref 11–51)

## 2016-09-09 MED ORDER — OXYCODONE-ACETAMINOPHEN 5-325 MG PO TABS
1.0000 | ORAL_TABLET | Freq: Once | ORAL | Status: AC
  Administered 2016-09-09: 1 via ORAL
  Filled 2016-09-09: qty 1

## 2016-09-09 NOTE — Discharge Instructions (Signed)
Follow up with your primary care provider if pain persists.   Please seek immediate care if you develop any of the following symptoms: The pain does not go away.  You have a fever.  You keep throwing up (vomiting) You pass bloody or black tarry stools.  There is bright red blood in the stool.  Constipation stays for more than 4 days.  There is rectal pain.  You do not seem to be getting better.  You have any questions or concerns.

## 2016-09-09 NOTE — ED Triage Notes (Signed)
Pt states he has been having right lower abd pain since yesterday; pain on palpation. Some Nausea, no vomiting. Pt states he has felt a little lightheaded.

## 2016-09-09 NOTE — ED Notes (Signed)
Pt having episodes of RLQ abd pain.  Pt currently pain free, advises last episode of pain was when he first got to ED.

## 2016-09-09 NOTE — ED Notes (Signed)
Pt to CT scanner

## 2016-09-09 NOTE — ED Provider Notes (Signed)
[facility] DEPT Provider Note   CSN: [id number] Arrival date & time: 09/09/16  0908     History   Chief Complaint Chief Complaint  Patient presents with  . Abdominal Pain    HPI [name] [name] is a 55 y.o. male.  The history is provided by the patient and medical records. No language interpreter was used.  Abdominal Pain   Associated symptoms include nausea. Pertinent negatives include fever, diarrhea, vomiting, constipation, dysuria and headaches.   [name] [name] is a 55 y.o. male  with a PMH of GERD, prior cholecystectomy, kidney stones, hemorrhoids who presents to the Emergency Department complaining of intermittent sharp right groin pain that began yesterday. Episodes last a few seconds to minutes then resolve. Episodes have become more frequent since last night, prompting him to come to ED. Associated with nausea at times. No emesis. No medication taken prior to arrival for symptoms. Pain worse with movement. Normal BM this morning. Denies fever, chills, diarrhea, constipation, new back pain, dysuria, penile pain/discharge.    Past Medical History:  Diagnosis Date  . Adenomatous colon polyp 2009  . Alcohol abuse    abstinent since 1992  . Arthritis    back  . Asthma   . Barrett's esophagus 05/22/2015  . Chronic back pain   . COPD (chronic obstructive pulmonary disease) ([place])   . Coronary artery disease    70-80% ostial LAD '10  . GERD (gastroesophageal reflux disease)    takes Nexium daily  . Gout    takes ALlopurinol daily  . Hemorrhoids   . History of bronchitis 2015  . History of kidney stones   . Hyperlipidemia    was given a script a month ago but scared to take it.Medical Md is aware  . Hypertension    takes Losartan,Metoprolol,and Amlodipine daily  . Joint pain   . Lymphocytic colitis 2009  . Myocardial infarction [facility]) 2010   "light" (admit for CP 11/2009; ruled out for MI but had 70-80% ostail LAD stenosis and treated medically following  NL perfusion study)  . NASH (nonalcoholic steatohepatitis)   . Peripheral vascular disease ([place])   . Personal history of colonic polyps 07/20/2002   Diminutive adenomas (3) 06/2002 diminutive adenoma (1) 2011   . Stroke ([place])   . TIA (transient ischemic attack)   . Urinary urgency     Patient Active Problem List   Diagnosis Date Noted  . Chronic cholecystitis 08/03/2015  . Barrett's esophagus 05/22/2015  . Abnormal findings on radiological examination of gastrointestinal tract 05/02/2015  . RUQ abdominal pain 05/02/2015  . Steatosis 05/02/2015  . Chest pain 10/11/2013  . History of CVA (cerebrovascular accident) 10/11/2013  . CVA (cerebral infarction) 05/26/2012  . CAD (coronary artery disease) 05/26/2012  . HEMORRHOIDS-INTERNAL 03/12/2010  . GOUT 08/25/2007  . HYPERTENSION 08/25/2007  . GERD 08/25/2007  . History of adenomatous polyp of colon 07/20/2002    Past Surgical History:  Procedure Laterality Date  . ANEURYSM COILING  12/2010   cerebral  . BAND HEMORRHOIDECTOMY  2003   at sigmoidoscopy  . CARDIAC CATHETERIZATION  2010  . CHOLECYSTECTOMY  08/03/2015  . CHOLECYSTECTOMY N/A 08/03/2015   Procedure: LAPAROSCOPIC CHOLECYSTECTOMY WITH INTRAOPERATIVE CHOLANGIOGRAM;  Surgeon: Fanny Skates, MD;  Location: [facility];  Service: General;  Laterality: N/A;  . COLONOSCOPY  multiple  . ESOPHAGOGASTRODUODENOSCOPY    . LUMBAR SPINE SURGERY     x 2  . NASAL SINUS SURGERY    . TEE WITHOUT CARDIOVERSION  05/28/2012  Procedure: TRANSESOPHAGEAL ECHOCARDIOGRAM (TEE);  Surgeon: Lelon Perla, MD;  Location: [facility] ENDOSCOPY;  Service: Cardiovascular;  Laterality: N/A;       Home Medications    Prior to Admission medications   Medication Sig Start Date End Date Taking? Authorizing Provider  allopurinol (ZYLOPRIM) 100 MG tablet Take 100 mg by mouth daily.    Historical Provider, MD  amLODipine (NORVASC) 10 MG tablet Take 10 mg by mouth daily.    Historical Provider, MD  aspirin 325 MG  tablet Take 325 mg by mouth daily.    Historical Provider, MD  docusate sodium (COLACE) 100 MG capsule Take 1 capsule (100 mg total) by mouth 2 (two) times daily as needed for mild constipation or moderate constipation. Patient not taking: Reported on 11/02/2015 08/04/15   Nat Christen, PA-C  esomeprazole (NEXIUM) 40 MG capsule Take 40 mg by mouth 2 (two) times daily.    Historical Provider, MD  HYDROcodone-acetaminophen (NORCO/VICODIN) 5-325 MG per tablet Take 1-2 tablets by mouth every 6 (six) hours as needed for moderate pain. Patient not taking: Reported on 11/02/2015 08/04/15   Nat Christen, PA-C  losartan (COZAAR) 100 MG tablet Take 100 mg by mouth daily.    Historical Provider, MD  metoprolol (LOPRESSOR) 100 MG tablet Take 100 mg by mouth 2 (two) times daily.    Historical Provider, MD  Milk Thistle 500 MG CAPS Take 1,000 mg by mouth daily.     Historical Provider, MD  Multiple Vitamin (MULITIVITAMIN WITH MINERALS) TABS Take 1 tablet by mouth daily.    Historical Provider, MD  naproxen (NAPROSYN) 500 MG tablet Take 500 mg by mouth every 12 (twelve) hours.    Historical Provider, MD  niacin (NIASPAN) 1000 MG CR tablet Take 1,000 mg by mouth at bedtime.    Historical Provider, MD  nitroGLYCERIN (NITROSTAT) 0.4 MG SL tablet Place 0.4 mg under the tongue every 5 (five) minutes as needed. For chest pain    Historical Provider, MD  Omega-3 Fatty Acids (FISH OIL) 1200 MG CPDR Take 1,200 mg by mouth daily.    Historical Provider, MD  traZODone (DESYREL) 50 MG tablet Take 50 mg by mouth at bedtime.    Historical Provider, MD    Family History Family History  Problem Relation Age of Onset  . Liver disease Mother   . Liver cancer Mother   . Breast cancer Mother   . Cancer Mother   . Hypertension Mother   . Heart attack Mother   . Cancer Father   . Heart disease Father     before age 46  . Hyperlipidemia Father   . Hypertension Father   . Heart attack Father   . Heart disease      multiple  family members on maternal and paternal side of family  . Diabetes Sister   . Hyperlipidemia Sister   . Hypertension Sister   . Diabetes Paternal Grandmother   . Hypertension Brother   . Colon cancer Neg Hx     Social History Social History  Substance Use Topics  . Smoking status: Current Every Day Smoker    Packs/day: 1.00    Years: 4.00    Types: Cigarettes  . Smokeless tobacco: Never Used  . Alcohol use No     Comment: no alcohol in 74yr      Allergies   Penicillins   Review of Systems Review of Systems  Constitutional: Negative for chills and fever.  HENT: Negative for congestion.   Eyes:  Negative for visual disturbance.  Respiratory: Negative for cough and shortness of breath.   Cardiovascular: Negative for chest pain.  Gastrointestinal: Positive for abdominal pain and nausea. Negative for constipation, diarrhea and vomiting.  Genitourinary: Negative for discharge, dysuria, penile pain and testicular pain.  Musculoskeletal: Negative for gait problem.  Skin: Negative for rash.  Neurological: Negative for headaches.     Physical Exam Updated Vital Signs BP 137/83   Pulse 61   Temp 98.3 F (36.8 C) (Oral)   Resp 18   Wt 103.4 kg   SpO2 97%   BMI 34.67 kg/m   Physical Exam  Constitutional: He is oriented to person, place, and time. He appears well-developed and well-nourished. No distress.  HENT:  Head: Normocephalic and atraumatic.  Cardiovascular: Normal rate, regular rhythm, normal heart sounds and intact distal pulses.  Exam reveals no gallop and no friction rub.   No murmur heard. Pulmonary/Chest: Effort normal and breath sounds normal. No respiratory distress. He has no wheezes. He has no rales. He exhibits no tenderness.  Abdominal: Soft. Bowel sounds are normal. He exhibits no distension. There is tenderness.    No CVA tenderness. No hernias appreciated.   Musculoskeletal: He exhibits no edema.  Neurological: He is alert and oriented to  person, place, and time.  Skin: Skin is warm and dry.  Nursing note and vitals reviewed.    ED Treatments / Results  Labs (all labs ordered are listed, but only abnormal results are displayed) Labs Reviewed  COMPREHENSIVE METABOLIC PANEL - Abnormal; Notable for the following:       Result Value   Glucose, Bld 114 (*)    All other components within normal limits  URINALYSIS, ROUTINE W REFLEX MICROSCOPIC (NOT AT [facility]) - Abnormal; Notable for the following:    Specific Gravity, Urine <1.005 (*)    All other components within normal limits  LIPASE, BLOOD  CBC    EKG  EKG Interpretation None       Radiology Ct Renal Stone Study  Result Date: 09/09/2016 CLINICAL DATA:  Right lower abdomen pain for 1 day, some nausea EXAM: CT ABDOMEN AND PELVIS WITHOUT CONTRAST TECHNIQUE: Multidetector CT imaging of the abdomen and pelvis was performed following the standard protocol without IV contrast. COMPARISON:  CT abdomen pelvis of 10/16/2015 FINDINGS: Lower chest: The lung bases are clear. Hepatobiliary: The liver is unremarkable in the unenhanced state. Surgical clips are present from prior cholecystectomy. Pancreas: The pancreas is normal in size and the pancreatic duct is not dilated. Spleen: The spleen is normal in size. Adrenals/Urinary Tract: The adrenal glands are symmetrical and normal. The kidneys are unremarkable in the unenhanced state. No hydronephrosis is seen. No renal calculi are noted. An exophytic lesion emanates from the lower pole of the right kidney measuring 9 mm in diameter with attenuation of 90 HU. This was described previously again with high HU attenuation value and probably represents a hemorrhagic cyst. This lesion has not changed in size over the intervening year. The proximal ureters are normal in caliber. The distal ureters also are normal in caliber and no distal ureteral calculus is seen. The urinary bladder is moderately urine distended with no abnormality noted.  Stomach/Bowel: The stomach is largely decompressed and cannot be evaluated. There does appear to be a small descending duodenum diverticulum present. There is no evidence of bowel distention. The colon is largely decompressed. The terminal ileum and the appendix are unremarkable. Vascular/Lymphatic: The abdominal aorta is normal in caliber with moderate abdominal  aortic atherosclerosis present. No aneurysmal dilatation is seen. No adenopathy is seen. Reproductive: The prostate is normal in size. Other: None Musculoskeletal: The lumbar vertebrae are in normal alignment. There appears to be solid fusion at L5-S1 with posterior hardware in place. The SI joints are corticated. IMPRESSION: 1. No renal or ureteral calculi are seen. There is no evidence of hydronephrosis. 2. Stable appearance of exophytic high attenuation 9 mm lesion emanating from the lower pole of the right kidney most consistent with a hyperdense cyst. 3. The appendix and terminal ileum are unremarkable. 4. Abdominal aortic atherosclerosis. Electronically Signed   By: Ivar Drape M.D.   On: 09/09/2016 11:14    Procedures Procedures (including critical care time)  Medications Ordered in ED Medications  oxyCODONE-acetaminophen (PERCOCET/ROXICET) 5-325 MG per tablet 1 tablet (1 tablet Oral Given 09/09/16 1021)     Initial Impression / Assessment and Plan / ED Course  I have reviewed the triage vital signs and the nursing notes.  Pertinent labs & imaging results that were available during my care of the patient were reviewed by me and considered in my medical decision making (see chart for details).  Clinical Course   [name] [name] is a 55 y.o. male who presents to ED for intermittent sharp right lower quadrant / groin pain since last night. Patient is afebrile and hemodynamically stable with a non-surgical abdominal exam. CT renal study performed with no acute findings. Labs and urine reassuring. PCP follow up if pain persists.  Reasons to return to ED discussed and all questions answered.    Patient seen by and discussed with Dr. Lacinda Axon who agrees with treatment plan.   Final Clinical Impressions(s) / ED Diagnoses   Final diagnoses:  Right groin pain    New Prescriptions New Prescriptions   No medications on file     [facility] Elanda Garmany, PA-C 09/09/16 1134    Nat Christen, MD 09/10/16 2107

## 2016-09-17 ENCOUNTER — Emergency Department (HOSPITAL_COMMUNITY)
Admission: EM | Admit: 2016-09-17 | Discharge: 2016-09-17 | Disposition: A | Discharge status: Home / Self Care | Payer: Medicare Other | Attending: Emergency Medicine | Admitting: Emergency Medicine

## 2016-09-17 ENCOUNTER — Emergency Department (HOSPITAL_COMMUNITY): Payer: Medicare Other

## 2016-09-17 ENCOUNTER — Encounter (HOSPITAL_COMMUNITY): Payer: Self-pay

## 2016-09-17 DIAGNOSIS — Z8673 Personal history of transient ischemic attack (TIA), and cerebral infarction without residual deficits: Secondary | ICD-10-CM | POA: Insufficient documentation

## 2016-09-17 DIAGNOSIS — F17210 Nicotine dependence, cigarettes, uncomplicated: Secondary | ICD-10-CM | POA: Diagnosis not present

## 2016-09-17 DIAGNOSIS — I2510 Atherosclerotic heart disease of native coronary artery without angina pectoris: Secondary | ICD-10-CM | POA: Diagnosis not present

## 2016-09-17 DIAGNOSIS — Y939 Activity, unspecified: Secondary | ICD-10-CM | POA: Diagnosis not present

## 2016-09-17 DIAGNOSIS — Z79899 Other long term (current) drug therapy: Secondary | ICD-10-CM | POA: Diagnosis not present

## 2016-09-17 DIAGNOSIS — S80212A Abrasion, left knee, initial encounter: Secondary | ICD-10-CM | POA: Diagnosis not present

## 2016-09-17 DIAGNOSIS — W1789XA Other fall from one level to another, initial encounter: Secondary | ICD-10-CM | POA: Diagnosis not present

## 2016-09-17 DIAGNOSIS — I252 Old myocardial infarction: Secondary | ICD-10-CM | POA: Diagnosis not present

## 2016-09-17 DIAGNOSIS — Z7982 Long term (current) use of aspirin: Secondary | ICD-10-CM | POA: Diagnosis not present

## 2016-09-17 DIAGNOSIS — Y999 Unspecified external cause status: Secondary | ICD-10-CM | POA: Diagnosis not present

## 2016-09-17 DIAGNOSIS — J449 Chronic obstructive pulmonary disease, unspecified: Secondary | ICD-10-CM | POA: Insufficient documentation

## 2016-09-17 DIAGNOSIS — I10 Essential (primary) hypertension: Secondary | ICD-10-CM | POA: Insufficient documentation

## 2016-09-17 DIAGNOSIS — W19XXXA Unspecified fall, initial encounter: Secondary | ICD-10-CM

## 2016-09-17 DIAGNOSIS — Y929 Unspecified place or not applicable: Secondary | ICD-10-CM | POA: Diagnosis not present

## 2016-09-17 DIAGNOSIS — M25562 Pain in left knee: Secondary | ICD-10-CM | POA: Diagnosis not present

## 2016-09-17 DIAGNOSIS — M7918 Myalgia, other site: Secondary | ICD-10-CM

## 2016-09-17 DIAGNOSIS — M545 Low back pain: Secondary | ICD-10-CM | POA: Insufficient documentation

## 2016-09-17 DIAGNOSIS — S8992XA Unspecified injury of left lower leg, initial encounter: Secondary | ICD-10-CM | POA: Diagnosis present

## 2016-09-17 DIAGNOSIS — M791 Myalgia: Secondary | ICD-10-CM | POA: Diagnosis not present

## 2016-09-17 MED ORDER — HYDROCODONE-ACETAMINOPHEN 5-325 MG PO TABS: ORAL_TABLET | ORAL | 0 refills | Status: DC

## 2016-09-17 MED ORDER — HYDROCODONE-ACETAMINOPHEN 5-325 MG PO TABS
1.0000 | ORAL_TABLET | Freq: Once | ORAL | Status: AC
  Administered 2016-09-17: 1 via ORAL
  Filled 2016-09-17: qty 1

## 2016-09-17 NOTE — ED Triage Notes (Signed)
Pt. Coming from home c/o bilateral knee pain from injury Saturday. Pt. Had mechanical fall out of his van onto gravel. Abrasions noted to left knee. Pt. Hx of back surgery and sts he is very sore from the injury.

## 2016-09-17 NOTE — ED Provider Notes (Signed)
[facility] DEPT Provider Note   CSN: [id number] Arrival date & time: 09/17/16  0803     History   Chief Complaint Chief Complaint  Patient presents with  . Knee Pain    HPI  Blood pressure 164/91, pulse 65, temperature 98 F (36.7 C), temperature source Oral, resp. rate 15, height 5' 8"  (1.727 m), weight 103.4 kg, SpO2 98 %.  [name] [name] is a 55 y.o. male complaining of  bilateral knee pain worse on the left than the right and low back pain status post fall onto the knees from a truck bed onto gravel 5 days ago. Patient is ambulatory but with pain. He's been taking ibuprofen at home with little relief. He status post L-spine fusion by Dr. Arnoldo Morale in the remote past. Patient with abrasions to the left knee, states last tetanus shot was within the last 2 years.  HPI  Past Medical History:  Diagnosis Date  . Adenomatous colon polyp 2009  . Alcohol abuse    abstinent since 1992  . Arthritis    back  . Asthma   . Barrett's esophagus 05/22/2015  . Chronic back pain   . COPD (chronic obstructive pulmonary disease) ([place])   . Coronary artery disease    70-80% ostial LAD '10  . GERD (gastroesophageal reflux disease)    takes Nexium daily  . Gout    takes ALlopurinol daily  . Hemorrhoids   . History of bronchitis 2015  . History of kidney stones   . Hyperlipidemia    was given a script a month ago but scared to take it.Medical Md is aware  . Hypertension    takes Losartan,Metoprolol,and Amlodipine daily  . Joint pain   . Lymphocytic colitis 2009  . Myocardial infarction [facility]) 2010   "light" (admit for CP 11/2009; ruled out for MI but had 70-80% ostail LAD stenosis and treated medically following NL perfusion study)  . NASH (nonalcoholic steatohepatitis)   . Peripheral vascular disease ([place])   . Personal history of colonic polyps 07/20/2002   Diminutive adenomas (3) 06/2002 diminutive adenoma (1) 2011   . Stroke ([place])   . TIA (transient ischemic attack)   .  Urinary urgency     Patient Active Problem List   Diagnosis Date Noted  . Chronic cholecystitis 08/03/2015  . Barrett's esophagus 05/22/2015  . Abnormal findings on radiological examination of gastrointestinal tract 05/02/2015  . RUQ abdominal pain 05/02/2015  . Steatosis 05/02/2015  . Chest pain 10/11/2013  . History of CVA (cerebrovascular accident) 10/11/2013  . CVA (cerebral infarction) 05/26/2012  . CAD (coronary artery disease) 05/26/2012  . HEMORRHOIDS-INTERNAL 03/12/2010  . GOUT 08/25/2007  . HYPERTENSION 08/25/2007  . GERD 08/25/2007  . History of adenomatous polyp of colon 07/20/2002    Past Surgical History:  Procedure Laterality Date  . ANEURYSM COILING  12/2010   cerebral  . BAND HEMORRHOIDECTOMY  2003   at sigmoidoscopy  . CARDIAC CATHETERIZATION  2010  . CHOLECYSTECTOMY  08/03/2015  . CHOLECYSTECTOMY N/A 08/03/2015   Procedure: LAPAROSCOPIC CHOLECYSTECTOMY WITH INTRAOPERATIVE CHOLANGIOGRAM;  Surgeon: Fanny Skates, MD;  Location: [facility];  Service: General;  Laterality: N/A;  . COLONOSCOPY  multiple  . ESOPHAGOGASTRODUODENOSCOPY    . LUMBAR SPINE SURGERY     x 2  . NASAL SINUS SURGERY    . TEE WITHOUT CARDIOVERSION  05/28/2012   Procedure: TRANSESOPHAGEAL ECHOCARDIOGRAM (TEE);  Surgeon: Lelon Perla, MD;  Location: [facility];  Service: Cardiovascular;  Laterality: N/A;  Home Medications    Prior to Admission medications   Medication Sig Start Date End Date Taking? Authorizing Provider  allopurinol (ZYLOPRIM) 100 MG tablet Take 100 mg by mouth daily.    Historical Provider, MD  amLODipine (NORVASC) 10 MG tablet Take 10 mg by mouth daily.    Historical Provider, MD  aspirin 325 MG tablet Take 325 mg by mouth daily.    Historical Provider, MD  docusate sodium (COLACE) 100 MG capsule Take 1 capsule (100 mg total) by mouth 2 (two) times daily as needed for mild constipation or moderate constipation. Patient not taking: Reported on 11/02/2015 08/04/15    Nat Christen, PA-C  esomeprazole (NEXIUM) 40 MG capsule Take 40 mg by mouth 2 (two) times daily.    Historical Provider, MD  HYDROcodone-acetaminophen (NORCO/VICODIN) 5-325 MG tablet Take 1-2 tablets by mouth every 6 hours as needed for pain and/or cough. 09/17/16   Malia Corsi, PA-C  losartan (COZAAR) 100 MG tablet Take 100 mg by mouth daily.    Historical Provider, MD  metoprolol (LOPRESSOR) 100 MG tablet Take 100 mg by mouth 2 (two) times daily.    Historical Provider, MD  Milk Thistle 500 MG CAPS Take 1,000 mg by mouth daily.     Historical Provider, MD  Multiple Vitamin (MULITIVITAMIN WITH MINERALS) TABS Take 1 tablet by mouth daily.    Historical Provider, MD  naproxen (NAPROSYN) 500 MG tablet Take 500 mg by mouth every 12 (twelve) hours.    Historical Provider, MD  niacin (NIASPAN) 1000 MG CR tablet Take 1,000 mg by mouth at bedtime.    Historical Provider, MD  nitroGLYCERIN (NITROSTAT) 0.4 MG SL tablet Place 0.4 mg under the tongue every 5 (five) minutes as needed. For chest pain    Historical Provider, MD  Omega-3 Fatty Acids (FISH OIL) 1200 MG CPDR Take 1,200 mg by mouth daily.    Historical Provider, MD  traZODone (DESYREL) 50 MG tablet Take 50 mg by mouth at bedtime.    Historical Provider, MD    Family History Family History  Problem Relation Age of Onset  . Liver disease Mother   . Liver cancer Mother   . Breast cancer Mother   . Cancer Mother   . Hypertension Mother   . Heart attack Mother   . Cancer Father   . Heart disease Father     before age 48  . Hyperlipidemia Father   . Hypertension Father   . Heart attack Father   . Heart disease      multiple family members on maternal and paternal side of family  . Diabetes Sister   . Hyperlipidemia Sister   . Hypertension Sister   . Diabetes Paternal Grandmother   . Hypertension Brother   . Colon cancer Neg Hx     Social History Social History  Substance Use Topics  . Smoking status: Current Every Day Smoker      Packs/day: 1.00    Years: 4.00    Types: Cigarettes  . Smokeless tobacco: Never Used  . Alcohol use No     Comment: no alcohol in 50yr      Allergies   Penicillins   Review of Systems Review of Systems  10 systems reviewed and found to be negative, except as noted in the HPI.  Physical Exam Updated Vital Signs BP 144/92   Pulse 65   Temp 98 F (36.7 C) (Oral)   Resp 16   Ht 5' 8"  (1.727 m)  Wt 103.4 kg   SpO2 96%   BMI 34.67 kg/m   Physical Exam  Constitutional: He is oriented to person, place, and time. He appears well-developed and well-nourished. No distress.  HENT:  Head: Normocephalic and atraumatic.  Mouth/Throat: Oropharynx is clear and moist.  Eyes: Conjunctivae and EOM are normal. Pupils are equal, round, and reactive to light.  Neck: Normal range of motion.  Cardiovascular: Normal rate, regular rhythm and intact distal pulses.  Exam reveals no gallop and no friction rub.   No murmur heard. Pulmonary/Chest: Effort normal and breath sounds normal. No respiratory distress. He has no wheezes. He has no rales. He exhibits no tenderness.  Abdominal: Soft. He exhibits no distension and no mass. There is no tenderness. There is no rebound and no guarding. No hernia.  Musculoskeletal: Normal range of motion.  Multiple partial thickness abrasions to left knee. Bilateral knees with full active range of motion, distally neurovascularly intact, stable to anterior and posterior drawer. No abnormal laxity on valgus or varus stress.  Neurological: He is alert and oriented to person, place, and time.  Skin: Capillary refill takes less than 2 seconds. He is not diaphoretic.  Psychiatric: He has a normal mood and affect.  Nursing note and vitals reviewed.    ED Treatments / Results  Labs (all labs ordered are listed, but only abnormal results are displayed) Labs Reviewed - No data to display  EKG  EKG Interpretation None       Radiology Dg Lumbar Spine  Complete  Result Date: 09/17/2016 CLINICAL DATA:  inj ,fell down Saturday ,having pain ant lt knee and x lower back ,hx fusion 10 yrs ago EXAM: LUMBAR SPINE - COMPLETE 4+ VIEW COMPARISON:  CT, 09/09/2016 FINDINGS: There are 4 non-rib-bearing lumbar type vertebral bodies. There are transitional thoracolumbar and lumbosacral vertebra. The transitional thoracolumbar vertebra is designated L1. No fracture.  No spondylolisthesis. Pedicle screws interconnecting rods fuse L5-S1. Mature bone graft material crosses the disc interspace. The orthopedic hardware is well-seated with no evidence of loosening. Unfused disc are normal in height.  Facet joints are well preserved. Soft tissues are unremarkable. IMPRESSION: 1. No fracture or acute finding. 2. Stable postsurgical changes from an L5-S1 posterior lumbar spine fusion. Electronically Signed   By: Lajean Manes M.D.   On: 09/17/2016 09:13   Dg Knee Complete 4 Views Left  Result Date: 09/17/2016 CLINICAL DATA:  inj ,fell down Saturday ,having pain ant lt knee and x lower back ,hx fusion 10 yrs ago EXAM: LEFT KNEE - COMPLETE 4+ VIEW COMPARISON:  None. FINDINGS: No evidence of fracture, dislocation, or joint effusion. No evidence of arthropathy or other focal bone abnormality. Soft tissues are unremarkable. IMPRESSION: Negative. Electronically Signed   By: Lajean Manes M.D.   On: 09/17/2016 09:11    Procedures Procedures (including critical care time)  Medications Ordered in ED Medications  HYDROcodone-acetaminophen (NORCO/VICODIN) 5-325 MG per tablet 1 tablet (1 tablet Oral Given 09/17/16 0843)     Initial Impression / Assessment and Plan / ED Course  I have reviewed the triage vital signs and the nursing notes.  Pertinent labs & imaging results that were available during my care of the patient were reviewed by me and considered in my medical decision making (see chart for details).  Clinical Course    Vitals:   09/17/16 0812 09/17/16 0830  BP:  164/91 144/92  Pulse: 65 65  Resp: 15 16  Temp: 98 F (36.7 C)   TempSrc: Oral  SpO2: 98% 96%  Weight: 103.4 kg   Height: 5' 8"  (1.727 m)     Medications  HYDROcodone-acetaminophen (NORCO/VICODIN) 5-325 MG per tablet 1 tablet (1 tablet Oral Given 09/17/16 0843)    [name] [name] is 55 y.o. male presenting with Bilateral knee and low back pain status post fall 5 days ago off the bed of a truck onto gravel. Patient is ambulatory, physical exam revealing only some abrasions to the knee, neurovascularly intact. X-rays negative. Patient given a short prescription for pain medication.  This is a shared visit with the attending physician who personally evaluated the patient and agrees with the care plan.   Evaluation does not show pathology that would require ongoing emergent intervention or inpatient treatment. Pt is hemodynamically stable and mentating appropriately. Discussed findings and plan with patient/guardian, who agrees with care plan. All questions answered. Return precautions discussed and outpatient follow up given.      Final Clinical Impressions(s) / ED Diagnoses   Final diagnoses:  Musculoskeletal pain  Fall, initial encounter    New Prescriptions New Prescriptions   HYDROCODONE-ACETAMINOPHEN (NORCO/VICODIN) 5-325 MG TABLET    Take 1-2 tablets by mouth every 6 hours as needed for pain and/or cough.     Monico Blitz, PA-C 09/17/16 0940    Monico Blitz, PA-C 09/17/16 0945    Quintella Reichert, MD 09/18/16 [phone]

## 2016-09-17 NOTE — Discharge Instructions (Signed)
Rest, Ice intermittently (in the first 24-48 hours), Gentle compression with an Ace wrap, and elevate (Limb above the level of the heart)   Take up to 871m of ibuprofen (that is usually 4 over the counter pills)  3 times a day for 5 days. Take with food.  Take vicodin for breakthrough pain, do not drink alcohol, drive, care for children or do other critical tasks while taking vicodin.  Please follow with your primary care doctor in the next 2 days for a check-up. They must obtain records for further management.   Do not hesitate to return to the Emergency Department for any new, worsening or concerning symptoms.

## 2016-10-21 ENCOUNTER — Emergency Department (HOSPITAL_COMMUNITY)
Admission: EM | Admit: 2016-10-21 | Discharge: 2016-10-21 | Disposition: A | Discharge status: Home / Self Care | Payer: Medicare Other | Attending: Emergency Medicine | Admitting: Emergency Medicine

## 2016-10-21 ENCOUNTER — Emergency Department (HOSPITAL_COMMUNITY): Payer: Medicare Other

## 2016-10-21 ENCOUNTER — Encounter (HOSPITAL_COMMUNITY): Payer: Self-pay | Admitting: *Deleted

## 2016-10-21 DIAGNOSIS — Y929 Unspecified place or not applicable: Secondary | ICD-10-CM | POA: Diagnosis not present

## 2016-10-21 DIAGNOSIS — M25562 Pain in left knee: Secondary | ICD-10-CM | POA: Diagnosis present

## 2016-10-21 DIAGNOSIS — F17210 Nicotine dependence, cigarettes, uncomplicated: Secondary | ICD-10-CM | POA: Diagnosis not present

## 2016-10-21 DIAGNOSIS — I252 Old myocardial infarction: Secondary | ICD-10-CM | POA: Insufficient documentation

## 2016-10-21 DIAGNOSIS — S8992XA Unspecified injury of left lower leg, initial encounter: Secondary | ICD-10-CM | POA: Diagnosis not present

## 2016-10-21 DIAGNOSIS — Z7982 Long term (current) use of aspirin: Secondary | ICD-10-CM | POA: Insufficient documentation

## 2016-10-21 DIAGNOSIS — I10 Essential (primary) hypertension: Secondary | ICD-10-CM | POA: Diagnosis not present

## 2016-10-21 DIAGNOSIS — I2510 Atherosclerotic heart disease of native coronary artery without angina pectoris: Secondary | ICD-10-CM | POA: Insufficient documentation

## 2016-10-21 DIAGNOSIS — Y999 Unspecified external cause status: Secondary | ICD-10-CM | POA: Insufficient documentation

## 2016-10-21 DIAGNOSIS — W19XXXA Unspecified fall, initial encounter: Secondary | ICD-10-CM | POA: Insufficient documentation

## 2016-10-21 DIAGNOSIS — Z8673 Personal history of transient ischemic attack (TIA), and cerebral infarction without residual deficits: Secondary | ICD-10-CM | POA: Insufficient documentation

## 2016-10-21 DIAGNOSIS — M25561 Pain in right knee: Secondary | ICD-10-CM

## 2016-10-21 DIAGNOSIS — Y939 Activity, unspecified: Secondary | ICD-10-CM | POA: Insufficient documentation

## 2016-10-21 DIAGNOSIS — J449 Chronic obstructive pulmonary disease, unspecified: Secondary | ICD-10-CM | POA: Insufficient documentation

## 2016-10-21 MED ORDER — MELOXICAM 7.5 MG PO TABS: 7.5000 mg | ORAL_TABLET | Freq: Every day | ORAL | 0 refills | Status: DC

## 2016-10-21 NOTE — ED Triage Notes (Signed)
Pt reports left knee pain due to a fall a month ago. Pt states he had knee evaluated, dx with contusion to knee. Pt states pain persist and will not improve. Pt took naprosyn without relief. Pt denies any recent fall since his fall in September.

## 2016-10-21 NOTE — ED Notes (Signed)
See providers assessment.

## 2016-10-21 NOTE — ED Provider Notes (Signed)
[facility] DEPT Provider Note   CSN: [id number] Arrival date & time: 10/21/16  1654  By signing my name below, I, Neta Mends, attest that this documentation has been prepared under the direction and in the presence of Etta Quill, NP. Electronically Signed: Neta Mends, ED Scribe. 10/21/2016. 6:10 PM.    History   Chief Complaint Chief Complaint  Patient presents with  . Knee Pain   The history is provided by the patient. No language interpreter was used.  Knee Pain   This is a new (1 month ago) problem. The problem occurs constantly. The problem has been gradually worsening. The pain is present in the left knee. Quality: Sore. He has tried cold and OTC pain medications for the symptoms. The treatment provided no relief. There has been a history of trauma (Fall in mid-September 2017.).   HPI Comments:  [name] [name] is a 55 y.o. male with PMHx of chronic back pain and arthritis who presents to the Emergency Department complaining of worsening left knee pain due to a fall 1 month ago. Pt was seen on 09/21/2016 after he fell out of his [employer] 1 week prior, injuring his knee. Pt states that the pain has been gradually worsening since the injury, and is primarily located across the outer surface of his left patella. Pt notes a depression on his left patella. Pt describes the pain as "sore." Pt has used ice and taken naproxen with no relief. Pt denies any falls/trauma since his fall last month. Pt denies other associated symptoms.   Past Medical History:  Diagnosis Date  . Adenomatous colon polyp 2009  . Alcohol abuse    abstinent since 1992  . Arthritis    back  . Asthma   . Barrett's esophagus 05/22/2015  . Chronic back pain   . COPD (chronic obstructive pulmonary disease) ([place])   . Coronary artery disease    70-80% ostial LAD '10  . GERD (gastroesophageal reflux disease)    takes Nexium daily  . Gout    takes ALlopurinol daily  . Hemorrhoids   . History of  bronchitis 2015  . History of kidney stones   . Hyperlipidemia    was given a script a month ago but scared to take it.Medical Md is aware  . Hypertension    takes Losartan,Metoprolol,and Amlodipine daily  . Joint pain   . Lymphocytic colitis 2009  . Myocardial infarction 2010   "light" (admit for CP 11/2009; ruled out for MI but had 70-80% ostail LAD stenosis and treated medically following NL perfusion study)  . NASH (nonalcoholic steatohepatitis)   . Peripheral vascular disease ([place])   . Personal history of colonic polyps 07/20/2002   Diminutive adenomas (3) 06/2002 diminutive adenoma (1) 2011   . Stroke ([place])   . TIA (transient ischemic attack)   . Urinary urgency     Patient Active Problem List   Diagnosis Date Noted  . Chronic cholecystitis 08/03/2015  . Barrett's esophagus 05/22/2015  . Abnormal findings on radiological examination of gastrointestinal tract 05/02/2015  . RUQ abdominal pain 05/02/2015  . Steatosis 05/02/2015  . Chest pain 10/11/2013  . History of CVA (cerebrovascular accident) 10/11/2013  . CVA (cerebral infarction) 05/26/2012  . CAD (coronary artery disease) 05/26/2012  . HEMORRHOIDS-INTERNAL 03/12/2010  . GOUT 08/25/2007  . HYPERTENSION 08/25/2007  . GERD 08/25/2007  . History of adenomatous polyp of colon 07/20/2002    Past Surgical History:  Procedure Laterality Date  . ANEURYSM COILING  12/2010  cerebral  . BAND HEMORRHOIDECTOMY  2003   at sigmoidoscopy  . CARDIAC CATHETERIZATION  2010  . CHOLECYSTECTOMY  08/03/2015  . CHOLECYSTECTOMY N/A 08/03/2015   Procedure: LAPAROSCOPIC CHOLECYSTECTOMY WITH INTRAOPERATIVE CHOLANGIOGRAM;  Surgeon: Fanny Skates, MD;  Location: [facility];  Service: General;  Laterality: N/A;  . COLONOSCOPY  multiple  . ESOPHAGOGASTRODUODENOSCOPY    . LUMBAR SPINE SURGERY     x 2  . NASAL SINUS SURGERY    . TEE WITHOUT CARDIOVERSION  05/28/2012   Procedure: TRANSESOPHAGEAL ECHOCARDIOGRAM (TEE);  Surgeon: Lelon Perla, MD;   Location: [facility] ENDOSCOPY;  Service: Cardiovascular;  Laterality: N/A;       Home Medications    Prior to Admission medications   Medication Sig Start Date End Date Taking? Authorizing Provider  allopurinol (ZYLOPRIM) 100 MG tablet Take 100 mg by mouth daily.   Yes Historical Provider, MD  amLODipine (NORVASC) 10 MG tablet Take 10 mg by mouth daily.   Yes Historical Provider, MD  aspirin 325 MG tablet Take 325 mg by mouth daily.   Yes Historical Provider, MD  esomeprazole (NEXIUM) 40 MG capsule Take 40 mg by mouth 2 (two) times daily.   Yes Historical Provider, MD  losartan (COZAAR) 100 MG tablet Take 100 mg by mouth daily.   Yes Historical Provider, MD  metoprolol (LOPRESSOR) 100 MG tablet Take 100 mg by mouth 2 (two) times daily.   Yes Historical Provider, MD  Milk Thistle 500 MG CAPS Take 1,000 mg by mouth daily.    Yes Historical Provider, MD  Multiple Vitamin (MULITIVITAMIN WITH MINERALS) TABS Take 1 tablet by mouth daily.   Yes Historical Provider, MD  naproxen (NAPROSYN) 500 MG tablet Take 500 mg by mouth every 12 (twelve) hours.   Yes Historical Provider, MD  niacin (NIASPAN) 1000 MG CR tablet Take 1,000 mg by mouth at bedtime.   Yes Historical Provider, MD  nitroGLYCERIN (NITROSTAT) 0.4 MG SL tablet Place 0.4 mg under the tongue every 5 (five) minutes as needed. For chest pain   Yes Historical Provider, MD  Omega-3 Fatty Acids (FISH OIL) 1200 MG CPDR Take 1,200 mg by mouth daily.   Yes Historical Provider, MD  traZODone (DESYREL) 50 MG tablet Take 50 mg by mouth at bedtime.   Yes Historical Provider, MD  docusate sodium (COLACE) 100 MG capsule Take 1 capsule (100 mg total) by mouth 2 (two) times daily as needed for mild constipation or moderate constipation. Patient not taking: Reported on 10/21/2016 08/04/15   Nat Christen, PA-C  HYDROcodone-acetaminophen (NORCO/VICODIN) 5-325 MG tablet Take 1-2 tablets by mouth every 6 hours as needed for pain and/or cough. Patient not taking:  Reported on 10/21/2016 09/17/16   Monico Blitz, PA-C    Family History Family History  Problem Relation Age of Onset  . Liver disease Mother   . Liver cancer Mother   . Breast cancer Mother   . Cancer Mother   . Hypertension Mother   . Heart attack Mother   . Cancer Father   . Heart disease Father     before age 68  . Hyperlipidemia Father   . Hypertension Father   . Heart attack Father   . Heart disease      multiple family members on maternal and paternal side of family  . Diabetes Sister   . Hyperlipidemia Sister   . Hypertension Sister   . Diabetes Paternal Grandmother   . Hypertension Brother   . Colon cancer Neg Hx  Social History Social History  Substance Use Topics  . Smoking status: Current Every Day Smoker    Packs/day: 1.00    Years: 4.00    Types: Cigarettes  . Smokeless tobacco: Never Used  . Alcohol use No     Comment: no alcohol in 38yr      Allergies   Penicillins   Review of Systems Review of Systems  Constitutional: Negative for fever.  Musculoskeletal: Positive for arthralgias.  All other systems reviewed and are negative.    Physical Exam Updated Vital Signs BP 149/92 (BP Location: Left Arm)   Pulse 78   Temp 98.6 F (37 C) (Oral)   Resp 18   Ht 5' 8.5" (1.74 m)   Wt 229 lb (103.9 kg)   SpO2 98%   BMI 34.31 kg/m   Physical Exam  Constitutional: He appears well-developed and well-nourished. No distress.  HENT:  Head: Normocephalic and atraumatic.  Eyes: Conjunctivae are normal.  Cardiovascular: Normal rate.   Pulmonary/Chest: Effort normal.  Abdominal: He exhibits no distension.  Musculoskeletal:  Left patellar pain. No joint instability noted.   Neurological: He is alert.  Skin: Skin is warm and dry.  Psychiatric: He has a normal mood and affect.  Nursing note and vitals reviewed.    ED Treatments / Results  DIAGNOSTIC STUDIES:  Oxygen Saturation is 98% on RA, normal by my interpretation.     COORDINATION OF CARE:  6:10 PM Discussed treatment plan with pt at bedside and pt agreed to plan.   Labs (all labs ordered are listed, but only abnormal results are displayed) Labs Reviewed - No data to display  EKG  EKG Interpretation None       Radiology Ct Knee Left Wo Contrast  Result Date: 10/21/2016 CLINICAL DATA:  Left knee pain since a fall 1 month ago. Initial encounter. EXAM: CT OF THE LEFT KNEE WITHOUT CONTRAST TECHNIQUE: Multidetector CT imaging of the left Knee was performed according to the standard protocol. Multiplanar CT image reconstructions were also generated. COMPARISON:  None. FINDINGS: Bones/Joint/Cartilage There is no acute bony or joint abnormality. There is no osteochondritis desiccans of the medial femoral condyle as question on prior plain films. Tiny focus of subchondral sclerosis in the posterior weight-bearing surface lateral femoral condyle measuring 0.6 cm transverse by 0.8 cm AP is likely degenerative. Ligaments Suboptimally assessed by CT.  Appear intact. Muscles and Tendons Unremarkable. Soft tissues Unremarkable. IMPRESSION: No acute abnormality. Negative for osteochondritis desiccans in the medial femoral condyle. Electronically Signed   By: TInge RiseM.D.   On: 10/21/2016 18:47   Dg Knee Complete 4 Views Left  Result Date: 10/21/2016 CLINICAL DATA:  Persistent left knee pain after fall in Sept EXAM: LEFT KNEE - COMPLETE 4+ VIEW COMPARISON:  09/17/2016 FINDINGS: Subtle osteochondral defect along the medial aspect of the medial femoral condyle. 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 No joint effusion. No acute displaced rib fracture. IMPRESSION: Tiny focus of lucency along the medial aspect of the medial femoral condyle may represent a transchondral injury such as osteochondritis dissecans. Cross-sectional imaging such as CT or MRI may help for further correlation and confirmation if clinically warranted.  Electronically Signed   By: DAshley RoyaltyM.D.   On: 10/21/2016 17:53    Procedures Procedures (including critical care time)  Medications Ordered in ED Medications - No data to display   Initial Impression / Assessment and Plan / ED Course  Patient X-Ray negative for obvious fracture or dislocation.  Pt advised to follow up with orthopedics. Patient given knee sleeve while in ED, conservative therapy recommended and discussed. Patient will be discharged home & is agreeable with above plan. Returns precautions discussed. Pt appears safe for discharge.   I have reviewed the triage vital signs and the nursing notes.  Pertinent labs & imaging results that were available during my care of the patient were reviewed by me and considered in my medical decision making (see chart for details).  Clinical Course      Final Clinical Impressions(s) / ED Diagnoses   Final diagnoses:  Acute pain of right knee    New Prescriptions Discharge Medication List as of 10/21/2016  7:27 PM    START taking these medications   Details  meloxicam (MOBIC) 7.5 MG tablet Take 1 tablet (7.5 mg total) by mouth daily., Starting Tue 10/21/2016, Print      I personally performed the services described in this documentation, which was scribed in my presence. The recorded information has been reviewed and is accurate.     Etta Quill, NP 10/21/16 [place], DO 10/21/16 [id number]

## 2016-10-21 NOTE — ED Notes (Signed)
Pt stable, understands discharge instructions, and reasons for return.

## 2016-10-21 NOTE — Progress Notes (Signed)
Orthopedic Tech Progress Note Patient Details:  [name] [name] 1961-10-27 [id number]  Ortho Devices Type of Ortho Device: Knee Sleeve Ortho Device/Splint Location: Lt Knee Ortho Device/Splint Interventions: Ordered, Application   [name] 10/21/2016, 7:22 PM

## 2016-10-24 IMAGING — CT CT KNEE*L* W/O CM
3 series · 12 of 33 positions shown, 14 images · non-contrast
Comparison: None.

CLINICAL DATA: Left knee pain since a fall 1 month ago. Initial
encounter.

EXAM:
CT OF THE LEFT KNEE WITHOUT CONTRAST
TECHNIQUE: Multidetector CT imaging of the left Knee was performed according to
the standard protocol. Multiplanar CT image reconstructions were
also generated.

[Series 5: lower ext 1.5 st · axial · 0.43mm/px · z∈[+310,+486]mm · 4 of 171 slices shown, 5 images]
[im 27/171  soft-tissue]
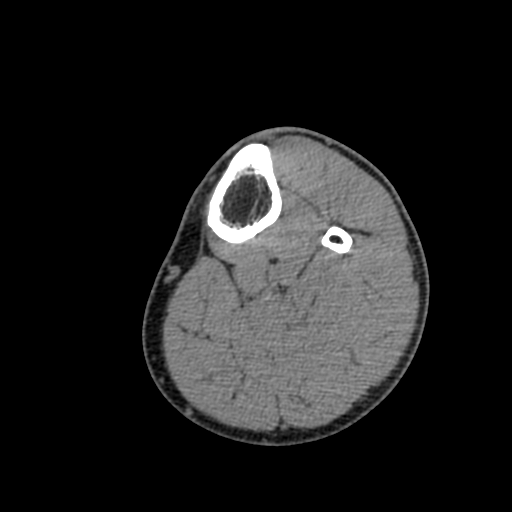
[im 27/171  bone]
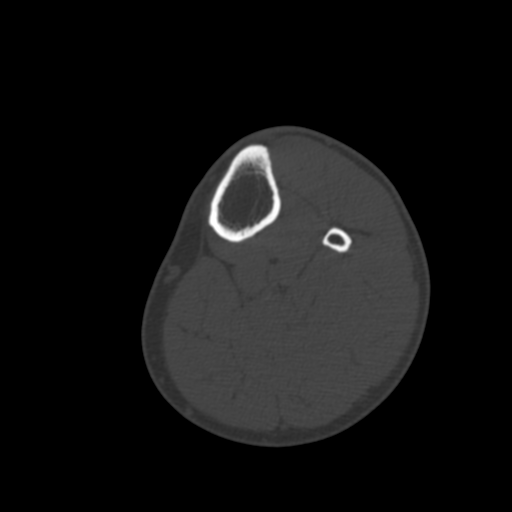
[im 66/171  bone]
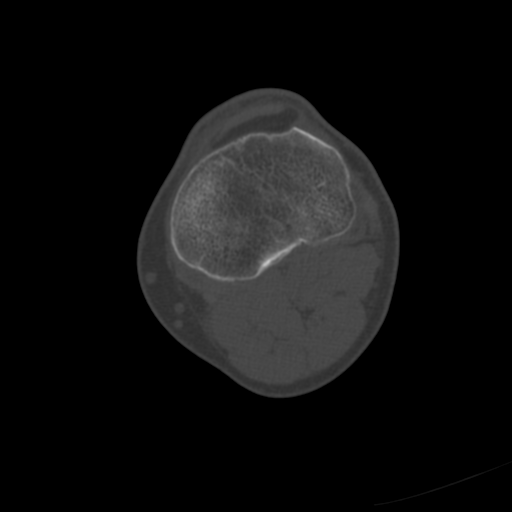
[im 105/171  bone]
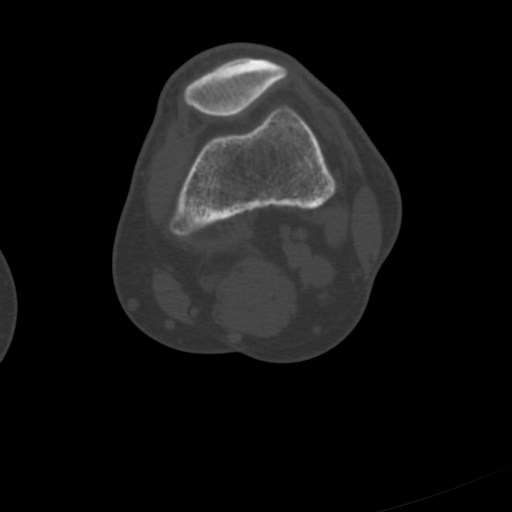
[im 144/171  bone]
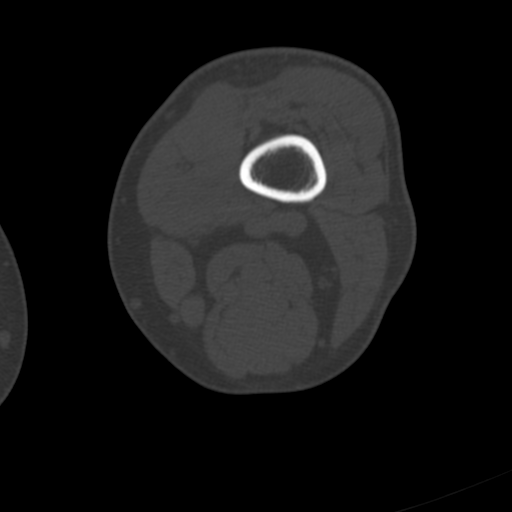

[Series 10: lower ext cor st · coronal · 0.34mm/px · 3 of 116 slices shown]
[im 24/116  bone]
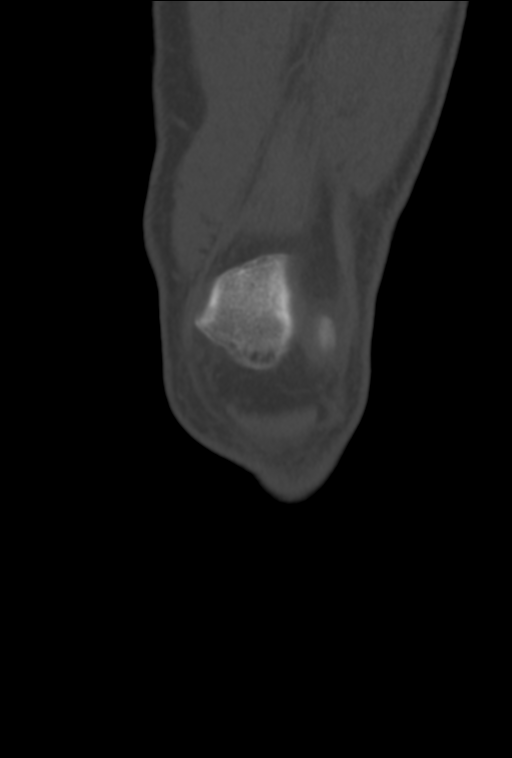
[im 47/116  bone]
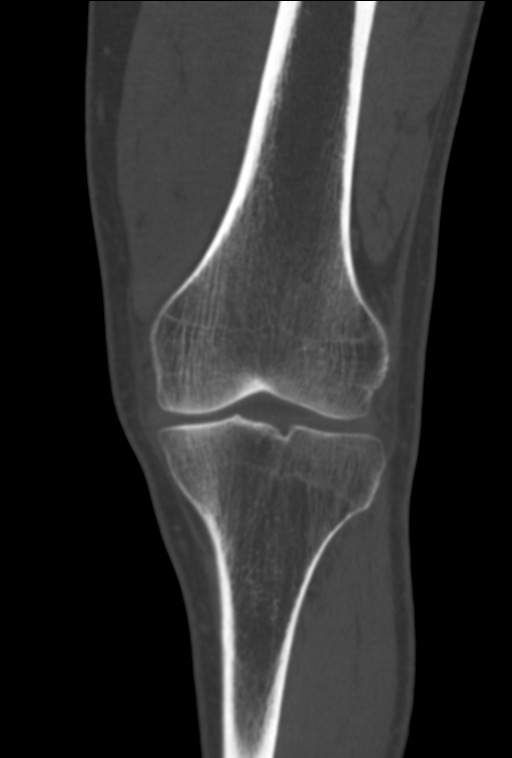
[im 70/116  bone]
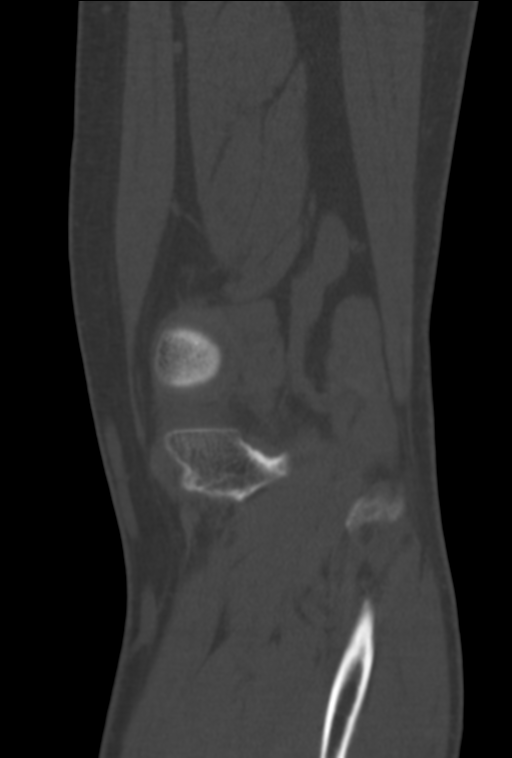

[Series 11: lower ext sag st · sagittal · 0.34mm/px · 5 of 91 slices shown, 6 images]
[im 31/91  bone]
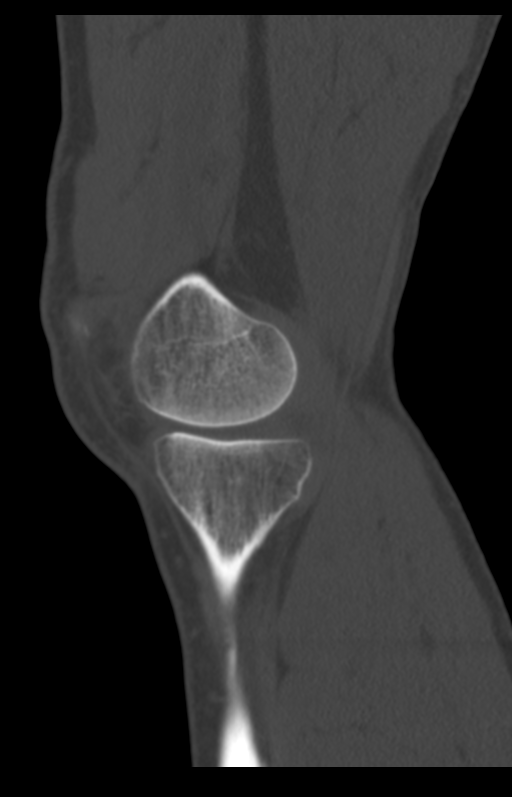
[im 38/91  bone]
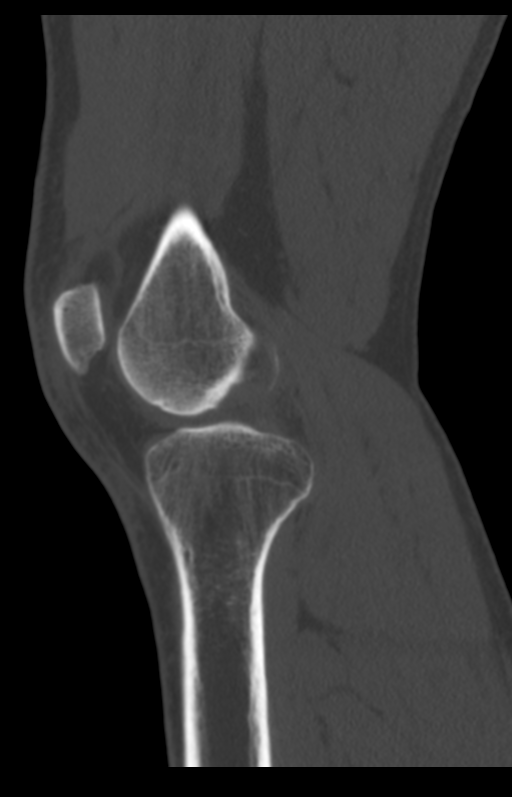
[im 46/91  soft-tissue]
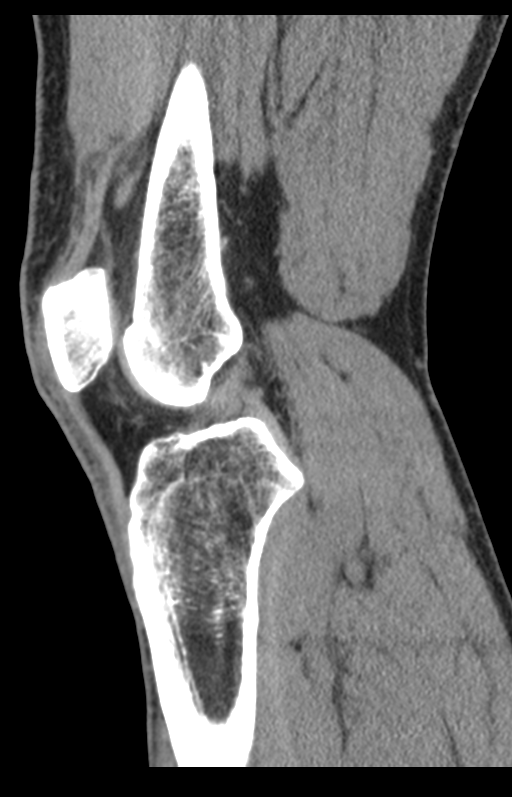
[im 46/91  bone]
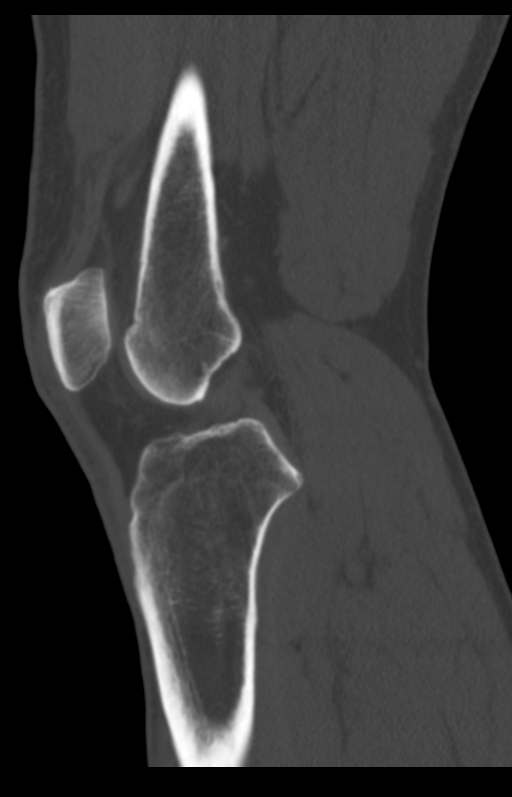
[im 53/91  bone]
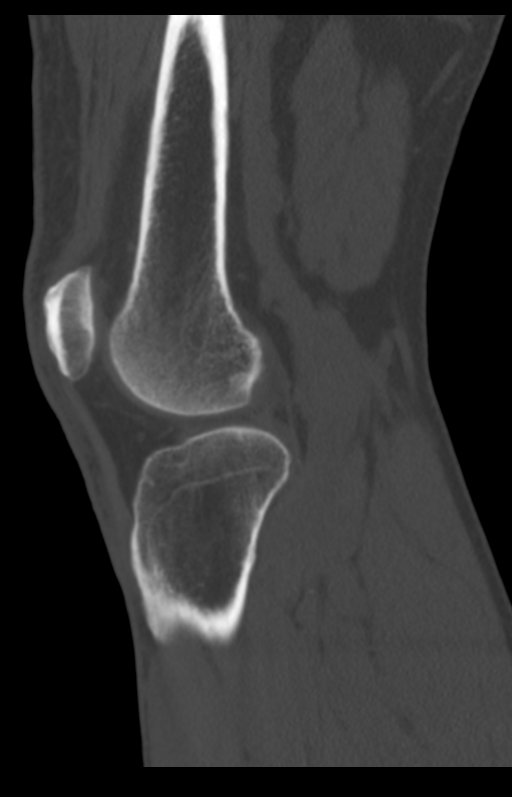
[im 61/91  bone]
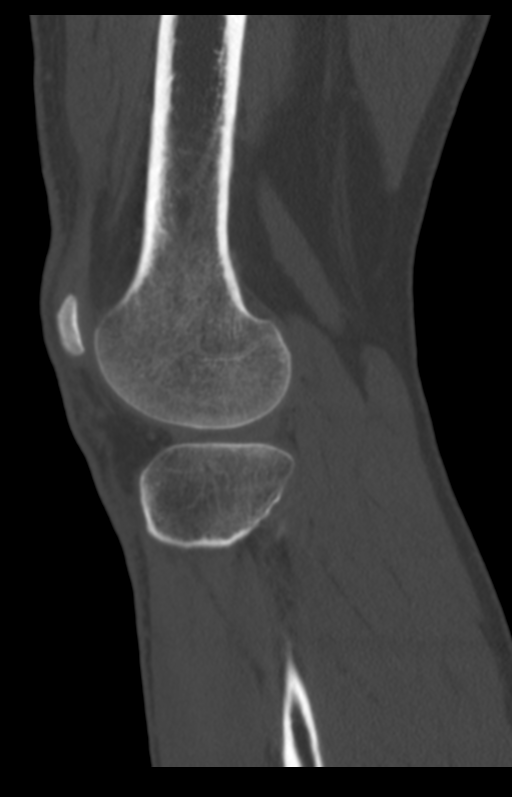

[12 of 33 positions shown; findings below may reference images not displayed]

FINDINGS: Bones/Joint/Cartilage

There is no acute bony or joint abnormality. There is no
osteochondritis desiccans of the medial femoral condyle as question
on prior plain films. Tiny focus of subchondral sclerosis in the
posterior weight-bearing surface lateral femoral condyle measuring
0.6 cm transverse by 0.8 cm AP is likely degenerative.

Ligaments

Suboptimally assessed by CT.  Appear intact.

Muscles and Tendons

Unremarkable.

Soft tissues

Unremarkable.
IMPRESSION: No acute abnormality. Negative for osteochondritis desiccans in the
medial femoral condyle.

## 2016-12-25 ENCOUNTER — Encounter (HOSPITAL_COMMUNITY): Payer: Self-pay | Admitting: Emergency Medicine

## 2016-12-25 ENCOUNTER — Emergency Department (HOSPITAL_COMMUNITY)
Admission: EM | Admit: 2016-12-25 | Discharge: 2016-12-25 | Disposition: A | Discharge status: Home / Self Care | Payer: Medicare Other | Attending: Emergency Medicine | Admitting: Emergency Medicine

## 2016-12-25 DIAGNOSIS — J449 Chronic obstructive pulmonary disease, unspecified: Secondary | ICD-10-CM | POA: Insufficient documentation

## 2016-12-25 DIAGNOSIS — I2510 Atherosclerotic heart disease of native coronary artery without angina pectoris: Secondary | ICD-10-CM | POA: Diagnosis not present

## 2016-12-25 DIAGNOSIS — Y939 Activity, unspecified: Secondary | ICD-10-CM | POA: Diagnosis not present

## 2016-12-25 DIAGNOSIS — F17210 Nicotine dependence, cigarettes, uncomplicated: Secondary | ICD-10-CM | POA: Insufficient documentation

## 2016-12-25 DIAGNOSIS — I252 Old myocardial infarction: Secondary | ICD-10-CM | POA: Diagnosis not present

## 2016-12-25 DIAGNOSIS — S3992XA Unspecified injury of lower back, initial encounter: Secondary | ICD-10-CM | POA: Diagnosis present

## 2016-12-25 DIAGNOSIS — I10 Essential (primary) hypertension: Secondary | ICD-10-CM | POA: Insufficient documentation

## 2016-12-25 DIAGNOSIS — M5440 Lumbago with sciatica, unspecified side: Secondary | ICD-10-CM | POA: Diagnosis not present

## 2016-12-25 DIAGNOSIS — Y999 Unspecified external cause status: Secondary | ICD-10-CM | POA: Insufficient documentation

## 2016-12-25 DIAGNOSIS — Z8673 Personal history of transient ischemic attack (TIA), and cerebral infarction without residual deficits: Secondary | ICD-10-CM | POA: Insufficient documentation

## 2016-12-25 DIAGNOSIS — X58XXXA Exposure to other specified factors, initial encounter: Secondary | ICD-10-CM | POA: Diagnosis not present

## 2016-12-25 DIAGNOSIS — Z7982 Long term (current) use of aspirin: Secondary | ICD-10-CM | POA: Diagnosis not present

## 2016-12-25 DIAGNOSIS — M545 Low back pain: Secondary | ICD-10-CM

## 2016-12-25 DIAGNOSIS — Y929 Unspecified place or not applicable: Secondary | ICD-10-CM | POA: Diagnosis not present

## 2016-12-25 DIAGNOSIS — Z79899 Other long term (current) drug therapy: Secondary | ICD-10-CM | POA: Diagnosis not present

## 2016-12-25 DIAGNOSIS — S39012A Strain of muscle, fascia and tendon of lower back, initial encounter: Secondary | ICD-10-CM | POA: Diagnosis not present

## 2016-12-25 MED ORDER — NAPROXEN 500 MG PO TABS: 500.0000 mg | ORAL_TABLET | Freq: Two times a day (BID) | ORAL | 0 refills | Status: DC

## 2016-12-25 MED ORDER — LIDOCAINE 5 % EX PTCH: 1.0000 | MEDICATED_PATCH | CUTANEOUS | 0 refills | Status: DC

## 2016-12-25 MED ORDER — KETOROLAC TROMETHAMINE 60 MG/2ML IM SOLN
60.0000 mg | Freq: Once | INTRAMUSCULAR | Status: AC
  Administered 2016-12-25: 60 mg via INTRAMUSCULAR
  Filled 2016-12-25: qty 2

## 2016-12-25 MED ORDER — METHOCARBAMOL 500 MG PO TABS: 500.0000 mg | ORAL_TABLET | Freq: Four times a day (QID) | ORAL | 0 refills | Status: DC | PRN

## 2016-12-25 NOTE — ED Triage Notes (Signed)
Pt states he has a back fusion 8 years ago and yesterday while sneezing he feels like he pulled something in his back in that same area.

## 2016-12-25 NOTE — ED Provider Notes (Signed)
[facility] DEPT Provider Note   CSN: [id number] Arrival date & time: 12/25/16  1343    By signing my name below, I, [name] [name], attest that this documentation has been prepared under the direction and in the presence of [name] Bibles, PA-C Electronically Signed: Macon [name], ED Scribe. 12/25/16. 2:39 PM.  History   Chief Complaint Chief Complaint  Patient presents with  . Back Pain   The history is provided by the patient. No language interpreter was used.    HPI Comments: [name] [name] is a 55 y.o. male with PMHx of gout, HTN, CAD, who presents to the Emergency Department complaining of moderate, constant, generalized low back pain onset yesterday. Pt states he sneezed several times yesterday and on the last sneeze felt like he pulled a muscle in his back. He describes his back pain as a stabbing sensation. Pt notes it radiates bilaterally to his hips and legs. Pt denies recent trauma, injury, heavy lifting, falls, twisting, bending. Pt is ambulatory with difficulty. Pt reports having a walking cane to assist him, but notes he recently broke it. He states taking OTC pain medication (advil) with minimal relief. No h/o cancer. Denies bowel or bladder incontinence, bowel changes, constipation, saddle anesthesia, fever, cough, abdominal pain, nausea, emesis, dysuria, hematuria, frequency, rash. Denies numbness, focal weakness or paresthesia of the lower extremities. PCP is Dr. Shirline Frees. Lumbar surgery years ago with Dr Arnoldo Morale.    Past Medical History:  Diagnosis Date  . Adenomatous colon polyp 2009  . Alcohol abuse    abstinent since 1992  . Arthritis    back  . Asthma   . Barrett's esophagus 05/22/2015  . Chronic back pain   . COPD (chronic obstructive pulmonary disease) ([place])   . Coronary artery disease    70-80% ostial LAD '10  . GERD (gastroesophageal reflux disease)    takes Nexium daily  . Gout    takes ALlopurinol daily  . Hemorrhoids   . History of  bronchitis 2015  . History of kidney stones   . Hyperlipidemia    was given a script a month ago but scared to take it.Medical Md is aware  . Hypertension    takes Losartan,Metoprolol,and Amlodipine daily  . Joint pain   . Lymphocytic colitis 2009  . Myocardial infarction 2010   "light" (admit for CP 11/2009; ruled out for MI but had 70-80% ostail LAD stenosis and treated medically following NL perfusion study)  . NASH (nonalcoholic steatohepatitis)   . Peripheral vascular disease ([place])   . Personal history of colonic polyps 07/20/2002   Diminutive adenomas (3) 06/2002 diminutive adenoma (1) 2011   . Stroke ([place])   . TIA (transient ischemic attack)   . Urinary urgency     Patient Active Problem List   Diagnosis Date Noted  . Chronic cholecystitis 08/03/2015  . Barrett's esophagus 05/22/2015  . Abnormal findings on radiological examination of gastrointestinal tract 05/02/2015  . RUQ abdominal pain 05/02/2015  . Steatosis 05/02/2015  . Chest pain 10/11/2013  . History of CVA (cerebrovascular accident) 10/11/2013  . CVA (cerebral infarction) 05/26/2012  . CAD (coronary artery disease) 05/26/2012  . HEMORRHOIDS-INTERNAL 03/12/2010  . GOUT 08/25/2007  . HYPERTENSION 08/25/2007  . GERD 08/25/2007  . History of adenomatous polyp of colon 07/20/2002    Past Surgical History:  Procedure Laterality Date  . ANEURYSM COILING  12/2010   cerebral  . BAND HEMORRHOIDECTOMY  2003   at sigmoidoscopy  . CARDIAC CATHETERIZATION  2010  .  CHOLECYSTECTOMY  08/03/2015  . CHOLECYSTECTOMY N/A 08/03/2015   Procedure: LAPAROSCOPIC CHOLECYSTECTOMY WITH INTRAOPERATIVE CHOLANGIOGRAM;  Surgeon: [name] Skates, MD;  Location: [facility];  Service: General;  Laterality: N/A;  . COLONOSCOPY  multiple  . ESOPHAGOGASTRODUODENOSCOPY    . LUMBAR SPINE SURGERY     x 2  . NASAL SINUS SURGERY    . TEE WITHOUT CARDIOVERSION  05/28/2012   Procedure: TRANSESOPHAGEAL ECHOCARDIOGRAM (TEE);  Surgeon: [name] Perla, MD;   Location: [facility] ENDOSCOPY;  Service: Cardiovascular;  Laterality: N/A;       Home Medications    Prior to Admission medications   Medication Sig Start Date End Date Taking? Authorizing Provider  allopurinol (ZYLOPRIM) 100 MG tablet Take 100 mg by mouth daily.    Historical Provider, MD  amLODipine (NORVASC) 10 MG tablet Take 10 mg by mouth daily.    Historical Provider, MD  aspirin 325 MG tablet Take 325 mg by mouth daily.    Historical Provider, MD  docusate sodium (COLACE) 100 MG capsule Take 1 capsule (100 mg total) by mouth 2 (two) times daily as needed for mild constipation or moderate constipation. Patient not taking: Reported on 10/21/2016 08/04/15   [name] Christen, PA-C  esomeprazole (NEXIUM) 40 MG capsule Take 40 mg by mouth 2 (two) times daily.    Historical Provider, MD  HYDROcodone-acetaminophen (NORCO/VICODIN) 5-325 MG tablet Take 1-2 tablets by mouth every 6 hours as needed for pain and/or cough. Patient not taking: Reported on 10/21/2016 09/17/16   [name] Ricks Pisciotta, PA-C  lidocaine (LIDODERM) 5 % Place 1 patch onto the skin daily. Remove & Discard patch within 12 hours or as directed by MD 12/25/16   [name] Bibles, PA-C  losartan (COZAAR) 100 MG tablet Take 100 mg by mouth daily.    Historical Provider, MD  meloxicam (MOBIC) 7.5 MG tablet Take 1 tablet (7.5 mg total) by mouth daily. 10/21/16   [name] Quill, NP  methocarbamol (ROBAXIN) 500 MG tablet Take 1-2 tablets (500-1,000 mg total) by mouth every 6 (six) hours as needed. 12/25/16   [name] Bibles, PA-C  metoprolol (LOPRESSOR) 100 MG tablet Take 100 mg by mouth 2 (two) times daily.    Historical Provider, MD  Milk Thistle 500 MG CAPS Take 1,000 mg by mouth daily.     Historical Provider, MD  Multiple Vitamin (MULITIVITAMIN WITH MINERALS) TABS Take 1 tablet by mouth daily.    Historical Provider, MD  naproxen (NAPROSYN) 500 MG tablet Take 1 tablet (500 mg total) by mouth 2 (two) times daily. 12/25/16   [name] Bibles, PA-C  niacin (NIASPAN)  1000 MG CR tablet Take 1,000 mg by mouth at bedtime.    Historical Provider, MD  nitroGLYCERIN (NITROSTAT) 0.4 MG SL tablet Place 0.4 mg under the tongue every 5 (five) minutes as needed. For chest pain    Historical Provider, MD  Omega-3 Fatty Acids (FISH OIL) 1200 MG CPDR Take 1,200 mg by mouth daily.    Historical Provider, MD  traZODone (DESYREL) 50 MG tablet Take 50 mg by mouth at bedtime.    Historical Provider, MD    Family History Family History  Problem Relation Age of Onset  . Liver disease Mother   . Liver cancer Mother   . Breast cancer Mother   . Cancer Mother   . Hypertension Mother   . Heart attack Mother   . Cancer Father   . Heart disease Father     before age 31  . Hyperlipidemia Father   . Hypertension  Father   . Heart attack Father   . Heart disease      multiple family members on maternal and paternal side of family  . Diabetes Sister   . Hyperlipidemia Sister   . Hypertension Sister   . Diabetes Paternal Grandmother   . Hypertension Brother   . Colon cancer Neg Hx     Social History Social History  Substance Use Topics  . Smoking status: Current Every Day Smoker    Packs/day: 1.00    Years: 4.00    Types: Cigarettes  . Smokeless tobacco: Never Used  . Alcohol use No     Comment: no alcohol in 70yr      Allergies   Penicillins   Review of Systems Review of Systems  Constitutional: Negative for fever.  Respiratory: Negative for cough.   Gastrointestinal: Negative for abdominal pain, constipation, nausea and vomiting.  Genitourinary: Negative for dysuria, frequency and hematuria.  Musculoskeletal: Positive for back pain.  Skin: Negative for rash.  Neurological: Negative for weakness and numbness.     Physical Exam Updated Vital Signs BP 147/92 (BP Location: Right Arm)   Pulse 80   Temp 98.1 F (36.7 C) (Oral)   Resp 16   Ht 5' 7"  (1.702 m)   Wt 103.9 kg   SpO2 98%   BMI 35.87 kg/m   Physical Exam  Constitutional: He appears  well-developed and well-nourished. No distress.  HENT:  Head: Normocephalic and atraumatic.  Neck: Neck supple.  Pulmonary/Chest: Effort normal.  Abdominal: Soft. He exhibits no distension. There is no tenderness. There is no rebound and no guarding.  Musculoskeletal: He exhibits no tenderness.  Spine nontender, no crepitus, or stepoffs. Lower extremities:  Strength 5/5, sensation intact, distal pulses intact.   No focal tenderness in exam. Gait is normal.   Neurological: He is alert.  Skin: He is not diaphoretic.  Nursing note and vitals reviewed.    ED Treatments / Results   DIAGNOSTIC STUDIES: Oxygen Saturation is 98% on RA, normal by my interpretation.    COORDINATION OF CARE: 2:36 PM Discussed treatment plan with pt at bedside which includes f/u with PCP and pain medication and pt agreed to plan.   Labs (all labs ordered are listed, but only abnormal results are displayed) Labs Reviewed - No data to display  EKG  EKG Interpretation None       Radiology No results found.  Procedures Procedures (including critical care time)  Medications Ordered in ED Medications  ketorolac (TORADOL) injection 60 mg (60 mg Intramuscular Given 12/25/16 1500)     Initial Impression / Assessment and Plan / ED Course  I have reviewed the triage vital signs and the nursing notes.  Pertinent labs & imaging results that were available during my care of the patient were reviewed by me and considered in my medical decision making (see chart for details).  Clinical Course     Afebrile, nontoxic patient with exacerbation of chronic back pain.  No red flags with history or exam.  Neurovascularly intact.  Emergent imaging not indicated at this time.   D/C home with lidoderm, robaxin, naproxen, Rx for cane, PCP follow up.  Discussed result, findings, treatment, and follow up  with patient.  Pt given return precautions.  Pt verbalizes understanding and agrees with plan.         Final  Clinical Impressions(s) / ED Diagnoses   Final diagnoses:  Acute low back pain, unspecified back pain laterality, with sciatica presence unspecified  Strain of lumbar region, initial encounter    New Prescriptions New Prescriptions   LIDOCAINE (LIDODERM) 5 %    Place 1 patch onto the skin daily. Remove & Discard patch within 12 hours or as directed by MD   METHOCARBAMOL (ROBAXIN) 500 MG TABLET    Take 1-2 tablets (500-1,000 mg total) by mouth every 6 (six) hours as needed.   NAPROXEN (NAPROSYN) 500 MG TABLET    Take 1 tablet (500 mg total) by mouth 2 (two) times daily.    I personally performed the services described in this documentation, which was scribed in my presence. The recorded information has been reviewed and is accurate.     [name] Bibles, PA-C 12/25/16 [place], MD 12/25/16 2139

## 2016-12-25 NOTE — Discharge Instructions (Signed)
Read the information below.  Use the prescribed medication as directed.  Please discuss all new medications with your pharmacist.  You may return to the Emergency Department at any time for worsening condition or any new symptoms that concern you.    If you develop fevers, loss of control of bowel or bladder, weakness or numbness in your legs, or are unable to walk, return to the ER for a recheck.

## 2017-02-11 DIAGNOSIS — M25562 Pain in left knee: Secondary | ICD-10-CM | POA: Diagnosis not present

## 2017-02-11 DIAGNOSIS — M25561 Pain in right knee: Secondary | ICD-10-CM | POA: Diagnosis not present

## 2017-02-12 ENCOUNTER — Other Ambulatory Visit: Payer: Self-pay | Admitting: Family Medicine

## 2017-02-12 DIAGNOSIS — R9389 Abnormal findings on diagnostic imaging of other specified body structures: Secondary | ICD-10-CM

## 2017-02-12 DIAGNOSIS — M25562 Pain in left knee: Secondary | ICD-10-CM

## 2017-02-19 ENCOUNTER — Ambulatory Visit
Admission: RE | Admit: 2017-02-19 | Discharge: 2017-02-19 | Disposition: A | Discharge status: Home / Self Care | Payer: Medicare Other | Source: Ambulatory Visit | Attending: Family Medicine | Admitting: Family Medicine

## 2017-02-19 ENCOUNTER — Other Ambulatory Visit: Payer: Self-pay | Admitting: Family Medicine

## 2017-02-19 DIAGNOSIS — M25561 Pain in right knee: Secondary | ICD-10-CM

## 2017-02-19 DIAGNOSIS — M25562 Pain in left knee: Secondary | ICD-10-CM | POA: Diagnosis not present

## 2017-02-19 DIAGNOSIS — R9389 Abnormal findings on diagnostic imaging of other specified body structures: Secondary | ICD-10-CM

## 2017-02-25 DIAGNOSIS — F5101 Primary insomnia: Secondary | ICD-10-CM | POA: Diagnosis not present

## 2017-02-25 DIAGNOSIS — M1712 Unilateral primary osteoarthritis, left knee: Secondary | ICD-10-CM | POA: Diagnosis not present

## 2017-02-25 DIAGNOSIS — M549 Dorsalgia, unspecified: Secondary | ICD-10-CM | POA: Diagnosis not present

## 2017-02-25 DIAGNOSIS — M10072 Idiopathic gout, left ankle and foot: Secondary | ICD-10-CM | POA: Diagnosis not present

## 2017-02-25 DIAGNOSIS — Z720 Tobacco use: Secondary | ICD-10-CM | POA: Diagnosis not present

## 2017-02-25 DIAGNOSIS — E7800 Pure hypercholesterolemia, unspecified: Secondary | ICD-10-CM | POA: Diagnosis not present

## 2017-02-25 DIAGNOSIS — J4520 Mild intermittent asthma, uncomplicated: Secondary | ICD-10-CM | POA: Diagnosis not present

## 2017-02-25 DIAGNOSIS — E781 Pure hyperglyceridemia: Secondary | ICD-10-CM | POA: Diagnosis not present

## 2017-02-25 DIAGNOSIS — K219 Gastro-esophageal reflux disease without esophagitis: Secondary | ICD-10-CM | POA: Diagnosis not present

## 2017-02-25 DIAGNOSIS — I10 Essential (primary) hypertension: Secondary | ICD-10-CM | POA: Diagnosis not present

## 2017-02-25 DIAGNOSIS — R7303 Prediabetes: Secondary | ICD-10-CM | POA: Diagnosis not present

## 2017-04-24 ENCOUNTER — Ambulatory Visit (INDEPENDENT_AMBULATORY_CARE_PROVIDER_SITE_OTHER): Payer: Medicare Other | Admitting: Orthopaedic Surgery

## 2017-04-24 ENCOUNTER — Encounter (INDEPENDENT_AMBULATORY_CARE_PROVIDER_SITE_OTHER): Payer: Self-pay | Admitting: Orthopaedic Surgery

## 2017-04-24 DIAGNOSIS — M23322 Other meniscus derangements, posterior horn of medial meniscus, left knee: Secondary | ICD-10-CM

## 2017-04-24 NOTE — Progress Notes (Signed)
Office Visit Note   Patient: [name] [name]           Date of Birth: 11-25-1961           MRN: [id number] Visit Date: 04/24/2017              Requested by: Shirline Frees, MD [address], [place] [id number] PCP: Shirline Frees, MD   Assessment & Plan: Visit Diagnoses:  1. 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 Now that is been 5 months since his injury and he still symptomatic I would recommend arthroscopic debridement of the tear. We did discuss an injection but I don't think that this is necessarily help him that much. He is also not really too keen on injections anyways. We did discuss risks benefits alternatives to surgery. He understands and wishes to proceed. I will have to get medical clearance from Dr. Kenton Kingfisher for since the patient has had a history of stroke and a mild heart attack. We will get him scheduled in the near future pending clearance.  Thank you for the referral.  Follow-Up Instructions: Return if symptoms worsen or fail to improve.   Orders:  No orders of the defined types were placed in this encounter.  No orders of the defined types were placed in this encounter.     Procedures: No procedures performed   Clinical Data: No additional findings.   Subjective: Chief Complaint  Patient presents with  . Left Knee - Pain    Patient is a very pleasant 56-year-old gentleman with left knee pain for about 5 months. He endorses aching grinding PAIN in left knee has 5 out of 10. He originally fell off a pickup truck. He's had conservative treatment with his primary care doctor. He is taking Advil naproxen with partial relief. He has not had injections is not really interested in any injections. Pain is worse with weightbearing. Denies any constitutional symptoms denies any radiation of pain    Review of  Systems  Constitutional: Negative.   All other systems reviewed and are negative.    Objective: Vital Signs: There were no vitals taken for this visit.  Physical Exam  Constitutional: He is oriented to person, place, and time. He appears well-developed and well-nourished.  HENT:  Head: Normocephalic and atraumatic.  Eyes: Pupils are equal, round, and reactive to light.  Neck: Neck supple.  Pulmonary/Chest: Effort normal.  Abdominal: Soft.  Musculoskeletal: Normal range of motion.  Neurological: He is alert and oriented to person, place, and time.  Skin: Skin is warm.  Psychiatric: He has a normal mood and affect. His behavior is normal. Judgment and thought content normal.  Nursing note and vitals reviewed.   Ortho Exam Left knee exam shows no joint effusion. Patellar tracking is normal. He does have medial joint line tenderness. Collaterals and cruciates are stable. Specialty Comments:  No specialty comments available.  Imaging: No results found.   PMFS History: Patient Active Problem List   Diagnosis Date Noted  . 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 Chronic cholecystitis 08/03/2015  . Barrett's esophagus 05/22/2015  . Abnormal findings on radiological examination of gastrointestinal tract 05/02/2015  . RUQ abdominal pain 05/02/2015  . Steatosis 05/02/2015  . Chest pain 10/11/2013  . History of CVA (cerebrovascular accident) 10/11/2013  . CVA (cerebral infarction)  05/26/2012  . CAD (coronary artery disease) 05/26/2012  . HEMORRHOIDS-INTERNAL 03/12/2010  . GOUT 08/25/2007  . HYPERTENSION 08/25/2007  . GERD 08/25/2007  . History of adenomatous polyp of colon 07/20/2002   Past Medical History:  Diagnosis Date  . Adenomatous colon polyp 2009  . Alcohol abuse    abstinent since 1992  . Arthritis    back  . Asthma   . Barrett's esophagus 05/22/2015  . Chronic back pain   . COPD (chronic obstructive pulmonary disease) ([place])   .  Coronary artery disease    70-80% ostial LAD '10  . GERD (gastroesophageal reflux disease)    takes Nexium daily  . Gout    takes ALlopurinol daily  . Hemorrhoids   . History of bronchitis 2015  . History of kidney stones   . Hyperlipidemia    was given a script a month ago but scared to take it.Medical Md is aware  . Hypertension    takes Losartan,Metoprolol,and Amlodipine daily  . Joint pain   . Lymphocytic colitis 2009  . Myocardial infarction [facility]) 2010   "light" (admit for CP 11/2009; ruled out for MI but had 70-80% ostail LAD stenosis and treated medically following NL perfusion study)  . NASH (nonalcoholic steatohepatitis)   . Peripheral vascular disease ([place])   . Personal history of colonic polyps 07/20/2002   Diminutive adenomas (3) 06/2002 diminutive adenoma (1) 2011   . Stroke ([place])   . TIA (transient ischemic attack)   . Urinary urgency     Family History  Problem Relation Age of Onset  . Liver disease Mother   . Liver cancer Mother   . Breast cancer Mother   . Cancer Mother   . Hypertension Mother   . Heart attack Mother   . Cancer Father   . Heart disease Father     before age 55  . Hyperlipidemia Father   . Hypertension Father   . Heart attack Father   . Heart disease      multiple family members on maternal and paternal side of family  . Diabetes Sister   . Hyperlipidemia Sister   . Hypertension Sister   . Diabetes Paternal Grandmother   . Hypertension Brother   . Colon cancer Neg Hx     Past Surgical History:  Procedure Laterality Date  . ANEURYSM COILING  12/2010   cerebral  . BAND HEMORRHOIDECTOMY  2003   at sigmoidoscopy  . CARDIAC CATHETERIZATION  2010  . CHOLECYSTECTOMY  08/03/2015  . CHOLECYSTECTOMY N/A 08/03/2015   Procedure: LAPAROSCOPIC CHOLECYSTECTOMY WITH INTRAOPERATIVE CHOLANGIOGRAM;  Surgeon: Fanny Skates, MD;  Location: [place];  Service: General;  Laterality: N/A;  . COLONOSCOPY  multiple  . ESOPHAGOGASTRODUODENOSCOPY    . LUMBAR  SPINE SURGERY     x 2  . NASAL SINUS SURGERY    . TEE WITHOUT CARDIOVERSION  05/28/2012   Procedure: TRANSESOPHAGEAL ECHOCARDIOGRAM (TEE);  Surgeon: Lelon Perla, MD;  Location[facility] ENDOSCOPY;  Service: Cardiovascular;  Laterality: N/A;   Social History   Occupational History  . Disabled Unemployed   Social History Main Topics  . Smoking status: Current Every Day Smoker    Packs/day: 1.00    Years: 4.00    Types: Cigarettes  . Smokeless tobacco: Never Used  . Alcohol use No     Comment: no alcohol in 4yr   . Drug use: No  . Sexual activity: Not on file

## 2017-05-15 ENCOUNTER — Encounter (HOSPITAL_BASED_OUTPATIENT_CLINIC_OR_DEPARTMENT_OTHER): Payer: Self-pay | Admitting: *Deleted

## 2017-05-15 NOTE — Progress Notes (Signed)
   05/15/17 1002  OBSTRUCTIVE SLEEP APNEA  Have you ever been diagnosed with sleep apnea through a sleep study? No  Do you snore loudly (loud enough to be heard through closed doors)?  0  Do you often feel tired, fatigued, or sleepy during the daytime (such as falling asleep during driving or talking to someone)? 0  Has anyone observed you stop breathing during your sleep? 0  Do you have, or are you being treated for high blood pressure? 1  BMI more than 35 kg/m2? 1  Age > 50 (1-yes) 1  Neck circumference greater than:Male 16 inches or larger, Male 17inches or larger? 0  Male Gender (Yes=1) 1  Obstructive Sleep Apnea Score 4

## 2017-05-18 ENCOUNTER — Encounter (HOSPITAL_BASED_OUTPATIENT_CLINIC_OR_DEPARTMENT_OTHER)
Admission: RE | Admit: 2017-05-18 | Discharge: 2017-05-18 | Disposition: A | Discharge status: Home / Self Care | Payer: Medicare Other | Source: Ambulatory Visit | Attending: Orthopaedic Surgery | Admitting: Orthopaedic Surgery

## 2017-05-18 ENCOUNTER — Encounter (HOSPITAL_BASED_OUTPATIENT_CLINIC_OR_DEPARTMENT_OTHER): Payer: Self-pay | Admitting: *Deleted

## 2017-05-18 DIAGNOSIS — M659 Synovitis and tenosynovitis, unspecified: Secondary | ICD-10-CM | POA: Diagnosis not present

## 2017-05-18 DIAGNOSIS — K219 Gastro-esophageal reflux disease without esophagitis: Secondary | ICD-10-CM | POA: Diagnosis not present

## 2017-05-18 DIAGNOSIS — M94262 Chondromalacia, left knee: Secondary | ICD-10-CM | POA: Diagnosis not present

## 2017-05-18 DIAGNOSIS — Z7982 Long term (current) use of aspirin: Secondary | ICD-10-CM | POA: Diagnosis not present

## 2017-05-18 DIAGNOSIS — J449 Chronic obstructive pulmonary disease, unspecified: Secondary | ICD-10-CM | POA: Diagnosis not present

## 2017-05-18 DIAGNOSIS — F17210 Nicotine dependence, cigarettes, uncomplicated: Secondary | ICD-10-CM | POA: Diagnosis not present

## 2017-05-18 DIAGNOSIS — M6752 Plica syndrome, left knee: Secondary | ICD-10-CM | POA: Diagnosis not present

## 2017-05-18 DIAGNOSIS — I2510 Atherosclerotic heart disease of native coronary artery without angina pectoris: Secondary | ICD-10-CM | POA: Diagnosis not present

## 2017-05-18 DIAGNOSIS — Z8673 Personal history of transient ischemic attack (TIA), and cerebral infarction without residual deficits: Secondary | ICD-10-CM | POA: Diagnosis not present

## 2017-05-18 DIAGNOSIS — M109 Gout, unspecified: Secondary | ICD-10-CM | POA: Diagnosis not present

## 2017-05-18 DIAGNOSIS — I10 Essential (primary) hypertension: Secondary | ICD-10-CM | POA: Diagnosis not present

## 2017-05-18 DIAGNOSIS — I252 Old myocardial infarction: Secondary | ICD-10-CM | POA: Diagnosis not present

## 2017-05-18 DIAGNOSIS — S83242A Other tear of medial meniscus, current injury, left knee, initial encounter: Secondary | ICD-10-CM | POA: Diagnosis not present

## 2017-05-18 DIAGNOSIS — Z79899 Other long term (current) drug therapy: Secondary | ICD-10-CM | POA: Diagnosis not present

## 2017-05-18 DIAGNOSIS — Z791 Long term (current) use of non-steroidal anti-inflammatories (NSAID): Secondary | ICD-10-CM | POA: Diagnosis not present

## 2017-05-18 NOTE — Progress Notes (Signed)
EKG completed and reviewed by Dr Leota Jacobsen and Dr. Smith Robert ok to proceed with surgery as planned.

## 2017-05-18 NOTE — Progress Notes (Signed)
He's clear for surgery.  I believe I've already given you a surgery sheet.  Thanks.

## 2017-05-19 ENCOUNTER — Other Ambulatory Visit (INDEPENDENT_AMBULATORY_CARE_PROVIDER_SITE_OTHER): Payer: Self-pay | Admitting: Orthopaedic Surgery

## 2017-05-19 DIAGNOSIS — S83242D Other tear of medial meniscus, current injury, left knee, subsequent encounter: Secondary | ICD-10-CM

## 2017-05-20 ENCOUNTER — Ambulatory Visit (HOSPITAL_BASED_OUTPATIENT_CLINIC_OR_DEPARTMENT_OTHER)
Admission: RE | Admit: 2017-05-20 | Discharge: 2017-05-20 | Disposition: A | Discharge status: Home / Self Care | Payer: Medicare Other | Source: Ambulatory Visit | Attending: Orthopaedic Surgery | Admitting: Orthopaedic Surgery

## 2017-05-20 ENCOUNTER — Encounter (HOSPITAL_BASED_OUTPATIENT_CLINIC_OR_DEPARTMENT_OTHER)
Admission: RE | Disposition: A | Discharge status: Home / Self Care | Payer: Self-pay | Source: Ambulatory Visit | Attending: Orthopaedic Surgery

## 2017-05-20 ENCOUNTER — Encounter (HOSPITAL_BASED_OUTPATIENT_CLINIC_OR_DEPARTMENT_OTHER): Payer: Self-pay | Admitting: Anesthesiology

## 2017-05-20 ENCOUNTER — Ambulatory Visit (HOSPITAL_BASED_OUTPATIENT_CLINIC_OR_DEPARTMENT_OTHER): Payer: Medicare Other | Admitting: Anesthesiology

## 2017-05-20 DIAGNOSIS — I252 Old myocardial infarction: Secondary | ICD-10-CM | POA: Diagnosis not present

## 2017-05-20 DIAGNOSIS — M659 Synovitis and tenosynovitis, unspecified: Secondary | ICD-10-CM | POA: Diagnosis not present

## 2017-05-20 DIAGNOSIS — S83242A Other tear of medial meniscus, current injury, left knee, initial encounter: Secondary | ICD-10-CM | POA: Insufficient documentation

## 2017-05-20 DIAGNOSIS — Z7982 Long term (current) use of aspirin: Secondary | ICD-10-CM | POA: Insufficient documentation

## 2017-05-20 DIAGNOSIS — K219 Gastro-esophageal reflux disease without esophagitis: Secondary | ICD-10-CM | POA: Insufficient documentation

## 2017-05-20 DIAGNOSIS — I639 Cerebral infarction, unspecified: Secondary | ICD-10-CM | POA: Diagnosis not present

## 2017-05-20 DIAGNOSIS — J449 Chronic obstructive pulmonary disease, unspecified: Secondary | ICD-10-CM | POA: Insufficient documentation

## 2017-05-20 DIAGNOSIS — F17210 Nicotine dependence, cigarettes, uncomplicated: Secondary | ICD-10-CM | POA: Diagnosis not present

## 2017-05-20 DIAGNOSIS — M23322 Other meniscus derangements, posterior horn of medial meniscus, left knee: Secondary | ICD-10-CM | POA: Diagnosis not present

## 2017-05-20 DIAGNOSIS — Z8673 Personal history of transient ischemic attack (TIA), and cerebral infarction without residual deficits: Secondary | ICD-10-CM | POA: Insufficient documentation

## 2017-05-20 DIAGNOSIS — M6752 Plica syndrome, left knee: Secondary | ICD-10-CM | POA: Diagnosis not present

## 2017-05-20 DIAGNOSIS — M94262 Chondromalacia, left knee: Secondary | ICD-10-CM | POA: Diagnosis not present

## 2017-05-20 DIAGNOSIS — I10 Essential (primary) hypertension: Secondary | ICD-10-CM | POA: Diagnosis not present

## 2017-05-20 DIAGNOSIS — S83242D Other tear of medial meniscus, current injury, left knee, subsequent encounter: Secondary | ICD-10-CM

## 2017-05-20 DIAGNOSIS — M109 Gout, unspecified: Secondary | ICD-10-CM | POA: Diagnosis not present

## 2017-05-20 DIAGNOSIS — I2510 Atherosclerotic heart disease of native coronary artery without angina pectoris: Secondary | ICD-10-CM | POA: Diagnosis not present

## 2017-05-20 DIAGNOSIS — Z791 Long term (current) use of non-steroidal anti-inflammatories (NSAID): Secondary | ICD-10-CM | POA: Insufficient documentation

## 2017-05-20 DIAGNOSIS — Z79899 Other long term (current) drug therapy: Secondary | ICD-10-CM | POA: Insufficient documentation

## 2017-05-20 DIAGNOSIS — M65862 Other synovitis and tenosynovitis, left lower leg: Secondary | ICD-10-CM | POA: Diagnosis not present

## 2017-05-20 HISTORY — PX: KNEE ARTHROSCOPY WITH MEDIAL MENISECTOMY: SHX5651

## 2017-05-20 HISTORY — DX: Prediabetes: R73.03

## 2017-05-20 SURGERY — ARTHROSCOPY, KNEE, WITH MEDIAL MENISCECTOMY
Anesthesia: General | Site: Knee | Laterality: Left

## 2017-05-20 MED ORDER — HYDROMORPHONE HCL 1 MG/ML IJ SOLN
0.2500 mg | INTRAMUSCULAR | Status: DC | PRN
  Administered 2017-05-20: 0.5 mg via INTRAVENOUS

## 2017-05-20 MED ORDER — ONDANSETRON HCL 4 MG/2ML IJ SOLN
INTRAMUSCULAR | Status: AC
  Filled 2017-05-20: qty 2

## 2017-05-20 MED ORDER — MIDAZOLAM HCL 5 MG/5ML IJ SOLN
INTRAMUSCULAR | Status: DC | PRN
  Administered 2017-05-20: 2 mg via INTRAVENOUS

## 2017-05-20 MED ORDER — HYDROMORPHONE HCL 1 MG/ML IJ SOLN
INTRAMUSCULAR | Status: AC
  Filled 2017-05-20: qty 1

## 2017-05-20 MED ORDER — OXYCODONE HCL 5 MG PO TABS
5.0000 mg | ORAL_TABLET | Freq: Once | ORAL | Status: AC | PRN
  Administered 2017-05-20: 5 mg via ORAL

## 2017-05-20 MED ORDER — SENNOSIDES-DOCUSATE SODIUM 8.6-50 MG PO TABS: 1.0000 | ORAL_TABLET | Freq: Every evening | ORAL | 1 refills | Status: DC | PRN

## 2017-05-20 MED ORDER — PROPOFOL 10 MG/ML IV BOLUS
INTRAVENOUS | Status: AC
  Filled 2017-05-20: qty 20

## 2017-05-20 MED ORDER — FENTANYL CITRATE (PF) 100 MCG/2ML IJ SOLN
INTRAMUSCULAR | Status: AC
  Filled 2017-05-20: qty 2

## 2017-05-20 MED ORDER — ONDANSETRON HCL 4 MG PO TABS: 4.0000 mg | ORAL_TABLET | Freq: Three times a day (TID) | ORAL | 0 refills | Status: DC | PRN

## 2017-05-20 MED ORDER — DEXAMETHASONE SODIUM PHOSPHATE 10 MG/ML IJ SOLN
INTRAMUSCULAR | Status: AC
  Filled 2017-05-20: qty 1

## 2017-05-20 MED ORDER — FENTANYL CITRATE (PF) 100 MCG/2ML IJ SOLN
INTRAMUSCULAR | Status: DC | PRN
  Administered 2017-05-20: 50 ug via INTRAVENOUS
  Administered 2017-05-20: 100 ug via INTRAVENOUS

## 2017-05-20 MED ORDER — LACTATED RINGERS IV SOLN
INTRAVENOUS | Status: DC
  Administered 2017-05-20 (×2): via INTRAVENOUS

## 2017-05-20 MED ORDER — LIDOCAINE HCL (CARDIAC) 20 MG/ML IV SOLN
INTRAVENOUS | Status: DC | PRN
  Administered 2017-05-20: 30 mg via INTRAVENOUS

## 2017-05-20 MED ORDER — OXYCODONE HCL 5 MG PO TABS
ORAL_TABLET | ORAL | Status: AC
  Filled 2017-05-20: qty 1

## 2017-05-20 MED ORDER — MEPERIDINE HCL 25 MG/ML IJ SOLN: 6.2500 mg | INTRAMUSCULAR | Status: DC | PRN

## 2017-05-20 MED ORDER — MIDAZOLAM HCL 2 MG/2ML IJ SOLN
INTRAMUSCULAR | Status: AC
  Filled 2017-05-20: qty 2

## 2017-05-20 MED ORDER — KETOROLAC TROMETHAMINE 30 MG/ML IJ SOLN
INTRAMUSCULAR | Status: AC
  Filled 2017-05-20: qty 1

## 2017-05-20 MED ORDER — FENTANYL CITRATE (PF) 100 MCG/2ML IJ SOLN: 50.0000 ug | INTRAMUSCULAR | Status: DC | PRN

## 2017-05-20 MED ORDER — ONDANSETRON HCL 4 MG/2ML IJ SOLN
INTRAMUSCULAR | Status: DC | PRN
  Administered 2017-05-20: 4 mg via INTRAVENOUS

## 2017-05-20 MED ORDER — PROPOFOL 10 MG/ML IV BOLUS
INTRAVENOUS | Status: DC | PRN
  Administered 2017-05-20: 150 mg via INTRAVENOUS

## 2017-05-20 MED ORDER — PROMETHAZINE HCL 25 MG PO TABS: 25.0000 mg | ORAL_TABLET | Freq: Four times a day (QID) | ORAL | 1 refills | Status: DC | PRN

## 2017-05-20 MED ORDER — DEXAMETHASONE SODIUM PHOSPHATE 4 MG/ML IJ SOLN
INTRAMUSCULAR | Status: DC | PRN
  Administered 2017-05-20: 10 mg via INTRAVENOUS

## 2017-05-20 MED ORDER — SCOPOLAMINE 1 MG/3DAYS TD PT72: 1.0000 | MEDICATED_PATCH | Freq: Once | TRANSDERMAL | Status: DC | PRN

## 2017-05-20 MED ORDER — HYDROCODONE-ACETAMINOPHEN 5-325 MG PO TABS: 1.0000 | ORAL_TABLET | Freq: Four times a day (QID) | ORAL | 0 refills | Status: DC | PRN

## 2017-05-20 MED ORDER — BUPIVACAINE HCL (PF) 0.25 % IJ SOLN
INTRAMUSCULAR | Status: AC
  Filled 2017-05-20: qty 30

## 2017-05-20 MED ORDER — CLINDAMYCIN PHOSPHATE 900 MG/50ML IV SOLN
INTRAVENOUS | Status: AC
  Filled 2017-05-20: qty 50

## 2017-05-20 MED ORDER — LIDOCAINE 2% (20 MG/ML) 5 ML SYRINGE
INTRAMUSCULAR | Status: AC
  Filled 2017-05-20: qty 5

## 2017-05-20 MED ORDER — OXYCODONE HCL 5 MG/5ML PO SOLN: 5.0000 mg | Freq: Once | ORAL | Status: AC | PRN

## 2017-05-20 MED ORDER — KETOROLAC TROMETHAMINE 30 MG/ML IJ SOLN
INTRAMUSCULAR | Status: DC | PRN
  Administered 2017-05-20: 30 mg via INTRAVENOUS

## 2017-05-20 MED ORDER — MIDAZOLAM HCL 2 MG/2ML IJ SOLN: 1.0000 mg | INTRAMUSCULAR | Status: DC | PRN

## 2017-05-20 MED ORDER — PROMETHAZINE HCL 25 MG/ML IJ SOLN: 6.2500 mg | INTRAMUSCULAR | Status: DC | PRN

## 2017-05-20 MED ORDER — CLINDAMYCIN PHOSPHATE 900 MG/50ML IV SOLN
900.0000 mg | INTRAVENOUS | Status: AC
  Administered 2017-05-20: 900 mg via INTRAVENOUS

## 2017-05-20 SURGICAL SUPPLY — 40 items
BANDAGE ACE 6X5 VEL STRL LF (GAUZE/BANDAGES/DRESSINGS) ×6 IMPLANT
BANDAGE ESMARK 6X9 LF (GAUZE/BANDAGES/DRESSINGS) IMPLANT
BLADE 4.2CUDA (BLADE) ×3 IMPLANT
BLADE CUDA GRT WHITE 3.5 (BLADE) IMPLANT
BLADE CUDA SHAVER 3.5 (BLADE) IMPLANT
BLADE CUTTER GATOR 3.5 (BLADE) IMPLANT
BNDG ESMARK 6X9 LF (GAUZE/BANDAGES/DRESSINGS)
CUFF TOURNIQUET SINGLE 34IN LL (TOURNIQUET CUFF) ×3 IMPLANT
DRAPE ARTHROSCOPY W/POUCH 90 (DRAPES) ×3 IMPLANT
DRAPE IMP U-DRAPE 54X76 (DRAPES) IMPLANT
DRAPE U-SHAPE 47X51 STRL (DRAPES) ×3 IMPLANT
DURAPREP 26ML APPLICATOR (WOUND CARE) ×3 IMPLANT
ELECT MENISCUS 165MM 90D (ELECTRODE) IMPLANT
ELECT REM PT RETURN 9FT ADLT (ELECTROSURGICAL)
ELECTRODE REM PT RTRN 9FT ADLT (ELECTROSURGICAL) IMPLANT
GAUZE SPONGE 4X4 12PLY STRL (GAUZE/BANDAGES/DRESSINGS) ×3 IMPLANT
GAUZE XEROFORM 1X8 LF (GAUZE/BANDAGES/DRESSINGS) ×3 IMPLANT
GLOVE BIO SURGEON STRL SZ 6.5 (GLOVE) ×2 IMPLANT
GLOVE BIO SURGEONS STRL SZ 6.5 (GLOVE) ×1
GLOVE BIOGEL PI IND STRL 7.0 (GLOVE) ×2 IMPLANT
GLOVE BIOGEL PI INDICATOR 7.0 (GLOVE) ×4
GLOVE SKINSENSE NS SZ7.5 (GLOVE) ×2
GLOVE SKINSENSE STRL SZ7.5 (GLOVE) ×1 IMPLANT
GLOVE SURG SYN 7.5  E (GLOVE) ×2
GLOVE SURG SYN 7.5 E (GLOVE) ×1 IMPLANT
GOWN STRL REIN XL XLG (GOWN DISPOSABLE) ×3 IMPLANT
GOWN STRL REUS W/ TWL LRG LVL3 (GOWN DISPOSABLE) ×1 IMPLANT
GOWN STRL REUS W/TWL LRG LVL3 (GOWN DISPOSABLE) ×2
KNEE WRAP E Z 3 GEL PACK (MISCELLANEOUS) ×3 IMPLANT
MANIFOLD NEPTUNE II (INSTRUMENTS) ×3 IMPLANT
PACK ARTHROSCOPY DSU (CUSTOM PROCEDURE TRAY) ×3 IMPLANT
PACK BASIN DAY SURGERY FS (CUSTOM PROCEDURE TRAY) ×3 IMPLANT
PENCIL BUTTON HOLSTER BLD 10FT (ELECTRODE) IMPLANT
RESECTOR FULL RADIUS 4.2MM (BLADE) IMPLANT
SET ARTHROSCOPY TUBING (MISCELLANEOUS) ×2
SET ARTHROSCOPY TUBING LN (MISCELLANEOUS) ×1 IMPLANT
SUT ETHILON 3 0 PS 1 (SUTURE) ×3 IMPLANT
TOWEL OR 17X24 6PK STRL BLUE (TOWEL DISPOSABLE) ×3 IMPLANT
TOWEL OR NON WOVEN STRL DISP B (DISPOSABLE) IMPLANT
WATER STERILE IRR 1000ML POUR (IV SOLUTION) ×3 IMPLANT

## 2017-05-20 NOTE — Discharge Instructions (Signed)
Post-operative patient instructions  Knee Arthroscopy    Ice:  Place intermittent ice or cooler pack over your knee, 30 minutes on and 30 minutes off.  Continue this for the first 72 hours after surgery, then save ice for use after therapy sessions or on more active days.    Weight:  You may bear weight on your leg as your symptoms allow.  Crutches:  Use crutches (or walker) to assist in walking until told to discontinue by your physical therapist or physician. This will help to reduce pain.  Strengthening:  Perform simple thigh squeezes (isometric quad contractions) and straight leg lifts as you are able (3 sets of 5 to 10 repetitions, 3 times a day).  For the leg lifts, have someone support under your ankle in the beginning until you have increased strength enough to do this on your own.  To help get started on thigh squeezes, place a pillow under your knee and push down on the pillow with back of knee (sometimes easier to do than with your leg fully straight).  Motion:  Perform gentle knee motion as tolerated - this is gentle bending and straightening of the knee. Seated heel slides: you can start by sitting in a chair, remove your brace, and gently slide your heel back on the floor - allowing your knee to bend. Have someone help you straighten your knee (or use your other leg/foot hooked under your ankle.   Dressing:  Perform 1st dressing change at 2 days postoperative. A moderate amount of blood tinged drainage is to be expected.  So if you bleed through the dressing on the first or second day or if you have fevers, it is fine to change the dressing/check the wounds early and redress wound. Elevate your leg.  If it bleeds through again, or if the incisions are leaking frank blood, please call the office. May change dressing every 1-2 days thereafter to help watch wounds. Can purchase Tegaderm (or 2M Nexcare) water resistant dressings at local pharmacy / [facility].  Shower:  Light shower is  ok after 2 days.  Please take shower, NO bath. Recover with gauze and ace wrap to help keep wounds protected.    Pain medication:  A narcotic pain medication has been prescribed.  Take as directed.  Typically you need narcotic pain medication more regularly during the first 3 to 5 days after surgery.  Decrease your use of the medication as the pain improves.  Narcotics can sometimes cause constipation, even after a few doses.  If you have problems with constipation, you can take an over the counter stool softener or light laxative.  If you have persistent problems, please notify your physicians office.  Physical therapy: Additional activity guidelines to be provided by your physician or physical therapist at follow-up visits.   Driving: Do not recommend driving x 2 weeks post surgical, especially if surgery performed on right side. Should not drive while taking narcotic pain medications. It typically takes at least 2 weeks to restore sufficient neuromuscular function for normal reaction times for driving safety.   Call [phone] for questions or problems. Evenings you will be forwarded to the hospital operator.  Ask for the orthopaedic physician on call. Please call if you experience:    o Redness, foul smelling, or persistent drainage from the surgical site  o worsening knee pain and swelling not responsive to medication  o any calf pain and or swelling of the lower leg  o temperatures greater than  101.5 F o other questions or concerns   Thank you for allowing [name] to be a part of your care.   Post Anesthesia Home Care Instructions  Activity: Get plenty of rest for the remainder of the day. A responsible individual must stay with you for 24 hours following the procedure.  For the next 24 hours, DO NOT: -Drive a car -Paediatric nurse -Drink alcoholic beverages -Take any medication unless instructed by your physician -Make any legal decisions or sign important papers.  Meals: Start  with liquid foods such as gelatin or soup. Progress to regular foods as tolerated. Avoid greasy, spicy, heavy foods. If nausea and/or vomiting occur, drink only clear liquids until the nausea and/or vomiting subsides. Call your physician if vomiting continues.  Special Instructions/Symptoms: Your throat may feel dry or sore from the anesthesia or the breathing tube placed in your throat during surgery. If this causes discomfort, gargle with warm salt water. The discomfort should disappear within 24 hours.  If you had a scopolamine patch placed behind your ear for the management of post- operative nausea and/or vomiting:  1. The medication in the patch is effective for 72 hours, after which it should be removed.  Wrap patch in a tissue and discard in the trash. Wash hands thoroughly with soap and water. 2. You may remove the patch earlier than 72 hours if you experience unpleasant side effects which may include dry mouth, dizziness or visual disturbances. 3. Avoid touching the patch. Wash your hands with soap and water after contact with the patch.

## 2017-05-20 NOTE — Anesthesia Preprocedure Evaluation (Addendum)
Anesthesia Evaluation  Patient identified by MRN, date of birth, ID band Patient awake    Reviewed: Allergy & Precautions, H&P , NPO status , Patient's Chart, lab work & pertinent test results, reviewed documented beta blocker date and time   Airway Mallampati: II  TM Distance: >3 FB Neck ROM: Full    Dental  (+) Upper Dentures, Partial Lower, Dental Advisory Given, Poor Dentition   Pulmonary asthma , COPD, Current Smoker,    Pulmonary exam normal breath sounds clear to auscultation       Cardiovascular hypertension, Pt. on medications and Pt. on home beta blockers + CAD, + Past MI and + Peripheral Vascular Disease   Rhythm:Regular Rate:Normal     Neuro/Psych TIACVA negative psych ROS   GI/Hepatic GERD  Medicated and Controlled,  Endo/Other  negative endocrine ROS  Renal/GU negative Renal ROS  negative genitourinary   Musculoskeletal  (+) Arthritis , Osteoarthritis,    Abdominal   Peds  Hematology negative hematology ROS (+)   Anesthesia Other Findings   Reproductive/Obstetrics negative OB ROS                            Anesthesia Physical  Anesthesia Plan  ASA: III  Anesthesia Plan: General   Post-op Pain Management:    Induction: Intravenous  Airway Management Planned: LMA  Additional Equipment:   Intra-op Plan:   Post-operative Plan: Extubation in OR  Informed Consent: I have reviewed the patients History and Physical, chart, labs and discussed the procedure including the risks, benefits and alternatives for the proposed anesthesia with the patient or authorized representative who has indicated his/her understanding and acceptance.   Dental advisory given  Plan Discussed with: CRNA  Anesthesia Plan Comments:         Anesthesia Quick Evaluation

## 2017-05-20 NOTE — Transfer of Care (Signed)
Immediate Anesthesia Transfer of Care Note  Patient: [name] [name]  Procedure(s) Performed: Procedure(s): LEFT KNEE ARTHROSCOPY WITH PARTIAL MEDIAL MENISCECTOMY (Left)  Patient Location: PACU  Anesthesia Type:General  Level of Consciousness: awake and patient cooperative  Airway & Oxygen Therapy: Patient Spontanous Breathing and Patient connected to face mask oxygen  Post-op Assessment: Report given to RN and Post -op Vital signs reviewed and stable  Post vital signs: Reviewed and stable  Last Vitals:  Vitals:   05/20/17 0716  BP: (!) 153/85  Pulse: 72  Resp: 18  Temp: 36.7 C    Last Pain:  Vitals:   05/20/17 0716  TempSrc: Oral  PainSc: 5       Patients Stated Pain Goal: 5 (10/05/11 1975)  Complications: No apparent anesthesia complications

## 2017-05-20 NOTE — H&P (Signed)
PREOPERATIVE H&P  Chief Complaint: left knee medial meniscal tear  HPI: [name] [name] is a 56 y.o. male who presents for surgical treatment of left knee medial meniscal tear.  He denies any changes in medical history.  Past Medical History:  Diagnosis Date  . Adenomatous colon polyp 2009  . Alcohol abuse    abstinent since 1992  . Arthritis    back  . Asthma   . Barrett's esophagus 05/22/2015  . Chronic back pain   . COPD (chronic obstructive pulmonary disease) ([place])   . Coronary artery disease    70-80% ostial LAD '10  . GERD (gastroesophageal reflux disease)    takes Nexium daily  . Gout    takes ALlopurinol daily  . Hemorrhoids   . History of bronchitis 2015  . History of kidney stones   . Hyperlipidemia    was given a script a month ago but scared to take it.Medical Md is aware  . Hypertension    takes Losartan,Metoprolol,and Amlodipine daily  . Joint pain   . Lymphocytic colitis 2009  . Myocardial infarction [facility]) 2010   "light" (admit for CP 11/2009; ruled out for MI but had 70-80% ostail LAD stenosis and treated medically following NL perfusion study)  . NASH (nonalcoholic steatohepatitis)   . Peripheral vascular disease ([place])   . Personal history of colonic polyps 07/20/2002   Diminutive adenomas (3) 06/2002 diminutive adenoma (1) 2011   . Pre-diabetes   . Stroke ([place])   . TIA (transient ischemic attack)   . Urinary urgency    Past Surgical History:  Procedure Laterality Date  . ANEURYSM COILING  12/2010   cerebral  . BAND HEMORRHOIDECTOMY  2003   at sigmoidoscopy  . CARDIAC CATHETERIZATION  2010  . CHOLECYSTECTOMY  08/03/2015  . CHOLECYSTECTOMY N/A 08/03/2015   Procedure: LAPAROSCOPIC CHOLECYSTECTOMY WITH INTRAOPERATIVE CHOLANGIOGRAM;  Surgeon: Fanny Skates, MD;  Location: [place];  Service: General;  Laterality: N/A;  . COLONOSCOPY  multiple  . ESOPHAGOGASTRODUODENOSCOPY    . LUMBAR SPINE SURGERY     x 2  . NASAL SINUS SURGERY    . TEE WITHOUT  CARDIOVERSION  05/28/2012   Procedure: TRANSESOPHAGEAL ECHOCARDIOGRAM (TEE);  Surgeon: Lelon Perla, MD;  Location[facility] ENDOSCOPY;  Service: Cardiovascular;  Laterality: N/A;   Social History   Social History  . Marital status: Married    Spouse name: N/A  . Number of children: 3  . Years of education: N/A   Occupational History  . Disabled Unemployed   Social History Main Topics  . Smoking status: Current Every Day Smoker    Packs/day: 1.00    Years: 4.00    Types: Cigarettes  . Smokeless tobacco: Never Used  . Alcohol use No     Comment: no alcohol in 26yr   . Drug use: No  . Sexual activity: Not Asked   Other Topics Concern  . None   Social History Narrative  . None   Family History  Problem Relation Age of Onset  . Liver disease Mother   . Liver cancer Mother   . Breast cancer Mother   . Cancer Mother   . Hypertension Mother   . Heart attack Mother   . Cancer Father   . Heart disease Father        before age 57 . Hyperlipidemia Father   . Hypertension Father   . Heart attack Father   . Heart disease Unknown  multiple family members on maternal and paternal side of family  . Diabetes Sister   . Hyperlipidemia Sister   . Hypertension Sister   . Diabetes Paternal Grandmother   . Hypertension Brother   . Colon cancer Neg Hx     Prior to Admission medications   Medication Sig Start Date End Date Taking? Authorizing Provider  albuterol (PROVENTIL HFA;VENTOLIN HFA) 108 (90 Base) MCG/ACT inhaler Inhale into the lungs every 6 (six) hours as needed for wheezing or shortness of breath.   Yes [provider]  allopurinol (ZYLOPRIM) 100 MG tablet Take 100 mg by mouth daily.   Yes [provider]  amLODipine (NORVASC) 10 MG tablet Take 10 mg by mouth daily.   Yes [provider]  aspirin 325 MG tablet Take 325 mg by mouth daily.   Yes [provider]  esomeprazole (NEXIUM) 40 MG capsule Take 40 mg by mouth 2 (two) times  daily.   Yes [provider]  losartan (COZAAR) 100 MG tablet Take 100 mg by mouth daily.   Yes [provider]  metoprolol (LOPRESSOR) 100 MG tablet Take 100 mg by mouth 2 (two) times daily.   Yes [provider]  Milk Thistle 500 MG CAPS Take 1,000 mg by mouth daily.    Yes [provider]  Multiple Vitamin (MULITIVITAMIN WITH MINERALS) TABS Take 1 tablet by mouth daily.   Yes [provider]  naproxen (NAPROSYN) 500 MG tablet Take 1 tablet (500 mg total) by mouth 2 (two) times daily. 12/25/16  Yes West, Emily, PA-C  niacin (NIASPAN) 1000 MG CR tablet Take 1,000 mg by mouth at bedtime.   Yes [provider]  Omega-3 Fatty Acids (FISH OIL) 1200 MG CPDR Take 1,200 mg by mouth daily.   Yes [provider]  traZODone (DESYREL) 50 MG tablet Take 50 mg by mouth at bedtime.   Yes [provider]  nitroGLYCERIN (NITROSTAT) 0.4 MG SL tablet Place 0.4 mg under the tongue every 5 (five) minutes as needed. For chest pain    [provider]     Positive ROS: All other systems have been reviewed and were otherwise negative with the exception of those mentioned in the HPI and as above.  Physical Exam: General: Alert, no acute distress Cardiovascular: No pedal edema Respiratory: No cyanosis, no use of accessory musculature GI: abdomen soft Skin: No lesions in the area of chief complaint Neurologic: Sensation intact distally Psychiatric: Patient is competent for consent with normal mood and affect Lymphatic: no lymphedema  MUSCULOSKELETAL: exam stable  Assessment: left knee medial meniscal tear  Plan: Plan for Procedure(s): LEFT KNEE ARTHROSCOPY WITH PARTIAL MEDIAL MENISCECTOMY  The risks benefits and alternatives were discussed with the patient including but not limited to the risks of nonoperative treatment, versus surgical intervention including infection, bleeding, nerve injury,  blood clots, cardiopulmonary  complications, morbidity, mortality, among others, and they were willing to proceed.   Eduard Roux, MD   05/20/2017 8:31 AM

## 2017-05-20 NOTE — Op Note (Signed)
   Date of Surgery: 05/20/2017  INDICATIONS: Mr. Alphin is a 56 y.o.-year-old male with a left knee medial meniscus tear;  The patient did consent to the procedure after discussion of the risks and benefits.  PREOPERATIVE DIAGNOSIS:  1. Left knee medial meniscus tear 2. Left knee synovitis  POSTOPERATIVE DIAGNOSIS: Same.  PROCEDURE:  1. Left knee arthroscopic partial medial meniscectomy 2. Left knee arthroscopic major synovectomy  SURGEON: N. Eduard Roux, M.D.  ASSIST: None.  ANESTHESIA:  general  IV FLUIDS AND URINE: See anesthesia.  ESTIMATED BLOOD LOSS: Minimal mL.  IMPLANTS: None  DRAINS: None  COMPLICATIONS: None.  DESCRIPTION OF PROCEDURE: The patient was brought to the operating room and placed supine on the operating table.  The patient had been signed prior to the procedure and this was documented. The patient had the anesthesia placed by the anesthesiologist.  A time-out was performed to confirm that this was the correct patient, site, side and location. The patient did receive antibiotics prior to the incision and was re-dosed during the procedure as needed at indicated intervals.  A tourniquet was placed.  The patient had the operative extremity prepped and draped in the standard surgical fashion.    Tourniquet was inflated to 300 mmHg. The standard anteromedial anterolateral arthroscopy portals were created. We first addressed the medial compartment. There were grade 2 chondromalacia of the medial compartment. There was a horizontal cleavage tear of the posterior horn of the medial meniscus. This was probed to assess the unstable portion and then was trimmed back to a stable border using an oscillating shaver. This was then probed again to assess stability. After this was done I then performed a major synovectomy of the medial compartment and lateral compartment and patellofemoral compartment. The anterior cruciate ligament was probed and was stable. Lateral compartment  was healthy and without pathology. The patellofemoral compartment had a large medial plica which was excised using oscillating shaver. Excess fluid was then drained from the knee. The portals were closed with interrupted 3-0 nylon sutures. Patient tolerated procedure well had no immediate complications.  POSTOPERATIVE PLAN: Patient will be discharged home.  Azucena Cecil, MD [place] 9:20 AM

## 2017-05-20 NOTE — Anesthesia Postprocedure Evaluation (Signed)
Anesthesia Post Note  Patient: [name] [name]  Procedure(s) Performed: Procedure(s) (LRB): LEFT KNEE ARTHROSCOPY WITH PARTIAL MEDIAL MENISCECTOMY (Left)  Patient location during evaluation: PACU Anesthesia Type: General Level of consciousness: sedated and patient cooperative Pain management: pain level controlled Vital Signs Assessment: post-procedure vital signs reviewed and stable Respiratory status: spontaneous breathing Cardiovascular status: stable Anesthetic complications: no       Last Vitals:  Vitals:   05/20/17 0945 05/20/17 1000  BP: 132/80 (!) 148/90  Pulse: (!) 57 62  Resp: 10 15  Temp:      Last Pain:  Vitals:   05/20/17 1036  TempSrc:   PainSc: [place]

## 2017-05-20 NOTE — Anesthesia Procedure Notes (Addendum)
Procedure Name: LMA Insertion Date/Time: 05/20/2017 8:41 AM Performed by: Toula Moos L Pre-anesthesia Checklist: Patient identified, Emergency Drugs available, Suction available, Patient being monitored and Timeout performed Patient Re-evaluated:Patient Re-evaluated prior to inductionOxygen Delivery Method: Circle system utilized Preoxygenation: Pre-oxygenation with 100% oxygen Intubation Type: IV induction Ventilation: Mask ventilation without difficulty LMA: LMA inserted LMA Size: 5.0 Number of attempts: 1 Airway Equipment and Method: Bite block Placement Confirmation: positive ETCO2 Tube secured with: Tape Dental Injury: Teeth and Oropharynx as per pre-operative assessment

## 2017-05-21 ENCOUNTER — Encounter (HOSPITAL_BASED_OUTPATIENT_CLINIC_OR_DEPARTMENT_OTHER): Payer: Self-pay | Admitting: Orthopaedic Surgery

## 2017-06-02 ENCOUNTER — Ambulatory Visit (INDEPENDENT_AMBULATORY_CARE_PROVIDER_SITE_OTHER): Payer: Medicare Other | Admitting: Orthopaedic Surgery

## 2017-06-02 DIAGNOSIS — M23322 Other meniscus derangements, posterior horn of medial meniscus, left knee: Secondary | ICD-10-CM

## 2017-06-02 MED ORDER — TRAMADOL HCL 50 MG PO TABS: 50.0000 mg | ORAL_TABLET | Freq: Four times a day (QID) | ORAL | 0 refills | Status: DC | PRN

## 2017-06-02 NOTE — Progress Notes (Signed)
[name] [name] is 2 weeks status post left knee arthroscopy with partial medial meniscectomy and synovectomy. He has throbbing pain and feels like he is a muscular ache. His incisions have healed. The sutures were removed. There is no signs of infection. No hip pain. Range of motion is progressing appropriately. Referral for physical therapy was made. Tramadol was prescribed. He does have heart disease and is taking naproxen for his back pain. I'll see him back in 6 weeks for recheck.

## 2017-06-03 DIAGNOSIS — M25662 Stiffness of left knee, not elsewhere classified: Secondary | ICD-10-CM | POA: Diagnosis not present

## 2017-06-03 DIAGNOSIS — M25562 Pain in left knee: Secondary | ICD-10-CM | POA: Diagnosis not present

## 2017-06-03 DIAGNOSIS — M25462 Effusion, left knee: Secondary | ICD-10-CM | POA: Diagnosis not present

## 2017-06-03 DIAGNOSIS — Z4889 Encounter for other specified surgical aftercare: Secondary | ICD-10-CM | POA: Diagnosis not present

## 2017-06-05 ENCOUNTER — Telehealth (INDEPENDENT_AMBULATORY_CARE_PROVIDER_SITE_OTHER): Payer: Self-pay | Admitting: Orthopaedic Surgery

## 2017-06-05 NOTE — Telephone Encounter (Signed)
I received VM from [place] @ [facility]. Requesting for OP note be faxed. I faxed to her [phone]

## 2017-07-16 ENCOUNTER — Ambulatory Visit (INDEPENDENT_AMBULATORY_CARE_PROVIDER_SITE_OTHER): Payer: Medicare Other | Admitting: Orthopaedic Surgery

## 2017-07-16 ENCOUNTER — Encounter (INDEPENDENT_AMBULATORY_CARE_PROVIDER_SITE_OTHER): Payer: Self-pay | Admitting: Orthopaedic Surgery

## 2017-07-16 DIAGNOSIS — M23322 Other meniscus derangements, posterior horn of medial meniscus, left knee: Secondary | ICD-10-CM

## 2017-07-16 NOTE — Progress Notes (Signed)
Patient is 6 weeks status post left knee arthroscopy and partial medial meniscectomy. He is doing well. He is doing home exercises. He has painless popping. Does not seem to be some symptomatic from this. He has excellent range of motion. He has good extensor and quad strength. Surgical scars are fully healed. No swelling or effusion. Patient has done well and has reached MMI. Follow up as needed.

## 2017-10-29 ENCOUNTER — Emergency Department (HOSPITAL_COMMUNITY): Payer: Medicare Other

## 2017-10-29 ENCOUNTER — Emergency Department (HOSPITAL_COMMUNITY)
Admission: EM | Admit: 2017-10-29 | Discharge: 2017-10-29 | Disposition: A | Discharge status: Home / Self Care | Payer: Medicare Other | Attending: Emergency Medicine | Admitting: Emergency Medicine

## 2017-10-29 ENCOUNTER — Encounter (HOSPITAL_COMMUNITY): Payer: Self-pay | Admitting: Nurse Practitioner

## 2017-10-29 DIAGNOSIS — J45909 Unspecified asthma, uncomplicated: Secondary | ICD-10-CM | POA: Diagnosis not present

## 2017-10-29 DIAGNOSIS — F17210 Nicotine dependence, cigarettes, uncomplicated: Secondary | ICD-10-CM | POA: Diagnosis not present

## 2017-10-29 DIAGNOSIS — Z8673 Personal history of transient ischemic attack (TIA), and cerebral infarction without residual deficits: Secondary | ICD-10-CM | POA: Insufficient documentation

## 2017-10-29 DIAGNOSIS — W01198A Fall on same level from slipping, tripping and stumbling with subsequent striking against other object, initial encounter: Secondary | ICD-10-CM | POA: Insufficient documentation

## 2017-10-29 DIAGNOSIS — R079 Chest pain, unspecified: Secondary | ICD-10-CM | POA: Diagnosis not present

## 2017-10-29 DIAGNOSIS — Z79899 Other long term (current) drug therapy: Secondary | ICD-10-CM | POA: Insufficient documentation

## 2017-10-29 DIAGNOSIS — I10 Essential (primary) hypertension: Secondary | ICD-10-CM | POA: Diagnosis not present

## 2017-10-29 DIAGNOSIS — Y939 Activity, unspecified: Secondary | ICD-10-CM | POA: Diagnosis not present

## 2017-10-29 DIAGNOSIS — R0781 Pleurodynia: Secondary | ICD-10-CM | POA: Diagnosis not present

## 2017-10-29 DIAGNOSIS — I2510 Atherosclerotic heart disease of native coronary artery without angina pectoris: Secondary | ICD-10-CM | POA: Insufficient documentation

## 2017-10-29 DIAGNOSIS — I252 Old myocardial infarction: Secondary | ICD-10-CM | POA: Insufficient documentation

## 2017-10-29 DIAGNOSIS — Y929 Unspecified place or not applicable: Secondary | ICD-10-CM | POA: Insufficient documentation

## 2017-10-29 DIAGNOSIS — R0782 Intercostal pain: Secondary | ICD-10-CM | POA: Diagnosis not present

## 2017-10-29 DIAGNOSIS — Y999 Unspecified external cause status: Secondary | ICD-10-CM | POA: Diagnosis not present

## 2017-10-29 DIAGNOSIS — J449 Chronic obstructive pulmonary disease, unspecified: Secondary | ICD-10-CM | POA: Diagnosis not present

## 2017-10-29 DIAGNOSIS — Z7982 Long term (current) use of aspirin: Secondary | ICD-10-CM | POA: Insufficient documentation

## 2017-10-29 DIAGNOSIS — S299XXA Unspecified injury of thorax, initial encounter: Secondary | ICD-10-CM | POA: Diagnosis not present

## 2017-10-29 DIAGNOSIS — R0789 Other chest pain: Secondary | ICD-10-CM | POA: Diagnosis not present

## 2017-10-29 MED ORDER — OXYCODONE-ACETAMINOPHEN 5-325 MG PO TABS
1.0000 | ORAL_TABLET | Freq: Once | ORAL | Status: AC
  Administered 2017-10-29: 1 via ORAL
  Filled 2017-10-29: qty 1

## 2017-10-29 MED ORDER — IBUPROFEN 400 MG PO TABS
400.0000 mg | ORAL_TABLET | Freq: Once | ORAL | Status: AC
  Administered 2017-10-29: 400 mg via ORAL

## 2017-10-29 MED ORDER — IBUPROFEN 400 MG PO TABS
ORAL_TABLET | ORAL | Status: AC
  Filled 2017-10-29: qty 1

## 2017-10-29 NOTE — ED Notes (Signed)
Patient transported to CT 

## 2017-10-29 NOTE — ED Triage Notes (Signed)
Pt was walking in front yard and tripped on limb of fallen tree and onto tree on left side and heard a cracking noise. Pt endorse left rib pain worse with deep breath and cough. Some redness noted to site and small abrasions to left elbow.

## 2017-10-29 NOTE — Discharge Instructions (Signed)
Please read attached information. If you experience any new or worsening signs or symptoms please return to the emergency room for evaluation. Please follow-up with your primary care provider or specialist as discussed.  °

## 2017-10-29 NOTE — ED Provider Notes (Signed)
[name] Provider Note   CSN: [id number] Arrival date & time: 10/29/17  1147   History   Chief Complaint Chief Complaint  Patient presents with  . Fall    HPI [name] [name] is a 56 y.o. male.  HPI   56 year old male presents status post fall.  Patient reports he fell and landed on a tree.  He notes he hit the lateral aspect of his ribs causing pain in the anterior portion.  Patient reports some shortness of breath, denies any palpable pain.  He denies any other injuries from the fall.  No history of the same.  No medications prior to arrival.     Past Medical History:  Diagnosis Date  . Adenomatous colon polyp 2009  . Alcohol abuse    abstinent since 1992  . Arthritis    back  . Asthma   . Barrett's esophagus 05/22/2015  . Chronic back pain   . COPD (chronic obstructive pulmonary disease) ([place])   . Coronary artery disease    70-80% ostial LAD '10  . GERD (gastroesophageal reflux disease)    takes Nexium daily  . Gout    takes ALlopurinol daily  . Hemorrhoids   . History of bronchitis 2015  . History of kidney stones   . Hyperlipidemia    was given a script a month ago but scared to take it.Medical Md is aware  . Hypertension    takes Losartan,Metoprolol,and Amlodipine daily  . Joint pain   . Lymphocytic colitis 2009  . Myocardial infarction [facility]) 2010   "light" (admit for CP 11/2009; ruled out for MI but had 70-80% ostail LAD stenosis and treated medically following NL perfusion study)  . NASH (nonalcoholic steatohepatitis)   . Peripheral vascular disease ([place])   . Personal history of colonic polyps 07/20/2002   Diminutive adenomas (3) 06/2002 diminutive adenoma (1) 2011   . Pre-diabetes   . Stroke ([place])   . TIA (transient ischemic attack)   . Urinary urgency     Patient Active Problem List   Diagnosis Date Noted  . 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 Chronic cholecystitis  08/03/2015  . Barrett's esophagus 05/22/2015  . Abnormal findings on radiological examination of gastrointestinal tract 05/02/2015  . RUQ abdominal pain 05/02/2015  . Steatosis 05/02/2015  . Chest pain 10/11/2013  . History of CVA (cerebrovascular accident) 10/11/2013  . CVA (cerebral infarction) 05/26/2012  . CAD (coronary artery disease) 05/26/2012  . HEMORRHOIDS-INTERNAL 03/12/2010  . GOUT 08/25/2007  . HYPERTENSION 08/25/2007  . GERD 08/25/2007  . History of adenomatous polyp of colon 07/20/2002    Past Surgical History:  Procedure Laterality Date  . ANEURYSM COILING  12/2010   cerebral  . BAND HEMORRHOIDECTOMY  2003   at sigmoidoscopy  . CARDIAC CATHETERIZATION  2010  . CHOLECYSTECTOMY  08/03/2015  . CHOLECYSTECTOMY N/A 08/03/2015   Procedure: LAPAROSCOPIC CHOLECYSTECTOMY WITH INTRAOPERATIVE CHOLANGIOGRAM;  Surgeon: Fanny Skates, MD;  Location: [place];  Service: General;  Laterality: N/A;  . COLONOSCOPY  multiple  . ESOPHAGOGASTRODUODENOSCOPY    . KNEE ARTHROSCOPY WITH MEDIAL MENISECTOMY Left 05/20/2017   Procedure: LEFT KNEE ARTHROSCOPY WITH PARTIAL MEDIAL MENISCECTOMY;  Surgeon: Leandrew Koyanagi, MD;  Location: [place];  Service: Orthopedics;  Laterality: Left;  . LUMBAR SPINE SURGERY     x 2  . NASAL SINUS SURGERY    . TEE WITHOUT CARDIOVERSION  05/28/2012   Procedure: TRANSESOPHAGEAL ECHOCARDIOGRAM (TEE);  Surgeon: Lelon Perla, MD;  Location: [facility] ENDOSCOPY;  Service: Cardiovascular;  Laterality: N/A;       Home Medications    Prior to Admission medications   Medication Sig Start Date End Date Taking? Authorizing Provider  albuterol (PROVENTIL HFA;VENTOLIN HFA) 108 (90 Base) MCG/ACT inhaler Inhale into the lungs every 6 (six) hours as needed for wheezing or shortness of breath.    [provider]  allopurinol (ZYLOPRIM) 100 MG tablet Take 100 mg by mouth daily.    [provider]  amLODipine (NORVASC) 10 MG tablet Take 10 mg by  mouth daily.    [provider]  aspirin 325 MG tablet Take 325 mg by mouth daily.    [provider]  esomeprazole (NEXIUM) 40 MG capsule Take 40 mg by mouth 2 (two) times daily.    [provider]  HYDROcodone-acetaminophen (NORCO) 5-325 MG tablet Take 1-2 tablets by mouth every 6 (six) hours as needed. Patient not taking: Reported on 07/16/2017 05/20/17   Leandrew Koyanagi, MD  losartan (COZAAR) 100 MG tablet Take 100 mg by mouth daily.    [provider]  metoprolol (LOPRESSOR) 100 MG tablet Take 100 mg by mouth 2 (two) times daily.    [provider]  Milk Thistle 500 MG CAPS Take 1,000 mg by mouth daily.     [provider]  Multiple Vitamin (MULITIVITAMIN WITH MINERALS) TABS Take 1 tablet by mouth daily.    [provider]  naproxen (NAPROSYN) 500 MG tablet Take 1 tablet (500 mg total) by mouth 2 (two) times daily. 12/25/16   [name], PA-C  niacin (NIASPAN) 1000 MG CR tablet Take 1,000 mg by mouth at bedtime.    [provider]  nitroGLYCERIN (NITROSTAT) 0.4 MG SL tablet Place 0.4 mg under the tongue every 5 (five) minutes as needed. For chest pain    [provider]  Omega-3 Fatty Acids (FISH OIL) 1200 MG CPDR Take 1,200 mg by mouth daily.    [provider]  ondansetron (ZOFRAN) 4 MG tablet Take 1-2 tablets (4-8 mg total) by mouth every 8 (eight) hours as needed for nausea or vomiting. Patient not taking: Reported on 07/16/2017 05/20/17   Leandrew Koyanagi, MD  promethazine (PHENERGAN) 25 MG tablet Take 1 tablet (25 mg total) by mouth every 6 (six) hours as needed for nausea. Patient not taking: Reported on 07/16/2017 05/20/17   Leandrew Koyanagi, MD  senna-docusate (SENOKOT S) 8.6-50 MG tablet Take 1 tablet by mouth at bedtime as needed. Patient not taking: Reported on 07/16/2017 05/20/17   Leandrew Koyanagi, MD  traMADol (ULTRAM) 50 MG tablet Take 1 tablet (50 mg total) by mouth every 6 (six) hours as needed. Patient  not taking: Reported on 07/16/2017 06/02/17   Leandrew Koyanagi, MD  traZODone (DESYREL) 50 MG tablet Take 50 mg by mouth at bedtime.    [provider]    Family History Family History  Problem Relation Age of Onset  . Liver disease Mother   . Liver cancer Mother   . Breast cancer Mother   . Cancer Mother   . Hypertension Mother   . Heart attack Mother   . Cancer Father   . Heart disease Father        before age 57  . Hyperlipidemia Father   . Hypertension Father   . Heart attack Father   . Heart disease Unknown        multiple family members  on maternal and paternal side of family  . Diabetes Sister   . Hyperlipidemia Sister   . Hypertension Sister   . Diabetes Paternal Grandmother   . Hypertension Brother   . Colon cancer Neg Hx     Social History Social History  Substance Use Topics  . Smoking status: Current Every Day Smoker    Packs/day: 1.00    Years: 4.00    Types: Cigarettes  . Smokeless tobacco: Never Used  . Alcohol use No     Comment: no alcohol in 16yr      Allergies   Penicillins   Review of Systems Review of Systems  All other systems reviewed and are negative.    Physical Exam Updated Vital Signs BP 132/86 (BP Location: Left Arm)   Pulse 77   Temp 98.4 F (36.9 C) (Oral)   Resp 17   Ht 5' 9"  (1.753 m)   Wt 103.9 kg (229 lb)   SpO2 100%   BMI 33.82 kg/m   Physical Exam  Pulmonary/Chest: Effort normal and breath sounds normal. No respiratory distress. He has no wheezes. He has no rales. He exhibits no tenderness.  Chest wall and ribs nontender to palpation  Abdominal: Soft. There is no tenderness.  Nursing note and vitals reviewed.  ED Treatments / Results  Labs (all labs ordered are listed, but only abnormal results are displayed) Labs Reviewed - No data to display  EKG  EKG Interpretation None       Radiology Dg Ribs Unilateral W/chest Left  Result Date: 10/29/2017 CLINICAL DATA:  Left anterior rib pain since a  trip and fall over a tree today. Initial encounter. EXAM: LEFT RIBS AND CHEST - 3+ VIEW COMPARISON:  PA and lateral chest 11/02/2015. FINDINGS: Lungs clear. Heart size normal. No pneumothorax or pleural effusion. No fracture or other bony abnormality. IMPRESSION: Negative for fracture.  Negative exam. Electronically Signed   By: TInge RiseM.D.   On: 10/29/2017 12:59   Ct Chest Wo Contrast  Result Date: 10/29/2017 CLINICAL DATA:  Pain and difficulty breathing following fall EXAM: CT CHEST WITHOUT CONTRAST TECHNIQUE: Multidetector CT imaging of the chest was performed following the standard protocol without IV contrast. COMPARISON:  Rib series October 29, 2017 FINDINGS: Cardiovascular: There is no appreciable thoracic aortic aneurysm. No evident mediastinal hematoma on noncontrast enhanced study. Visualized great vessels appear unremarkable. There are foci of atherosclerotic calcification in the aorta. There are foci of coronary artery calcification. Pericardium is not appreciably thickened. Mediastinum/Nodes: Thyroid appears normal. There are subcentimeter scattered lymph nodes. By size criteria, there is no evident adenopathy in the thoracic region. No esophageal lesions are appreciable. Lungs/Pleura: There is no evident pneumothorax or parenchymal lung contusion. There is no edema or consolidation. No pleural effusion or pleural thickening evident. Upper Abdomen: Gallbladder is absent. There is mild atherosclerotic calcification in the upper abdominal aorta. There is a 1 x 1 cm cyst in the mid left kidney laterally. Visualized upper abdominal structures otherwise appear unremarkable. Musculoskeletal: There is no demonstrable fracture or dislocation. No blastic or lytic bone lesions evident. IMPRESSION: 1. No edema or consolidation. No pneumothorax or parenchymal lung contusion. 2.  No demonstrable fracture.  No blastic or lytic bone lesions. 3.  No evident adenopathy. 4. There are foci of atherosclerotic  calcification in the aorta as well as in coronary arteries. No mediastinal hematoma evident on noncontrast enhanced study. 5.  Gallbladder absent. Aortic Atherosclerosis (ICD10-I70.0). Electronically Signed   By: WLowella GripIII  M.D.   On: 10/29/2017 14:39    Procedures Procedures (including critical care time)  Medications Ordered in ED Medications  ibuprofen (ADVIL,MOTRIN) 400 MG tablet (not administered)  ibuprofen (ADVIL,MOTRIN) tablet 400 mg (400 mg Oral Given 10/29/17 1231)  oxyCODONE-acetaminophen (PERCOCET/ROXICET) 5-325 MG per tablet 1 tablet (1 tablet Oral Given 10/29/17 1449)     Initial Impression / Assessment and Plan / ED Course  I have reviewed the triage vital signs and the nursing notes.  Pertinent labs & imaging results that were available during my care of the patient were reviewed by me and considered in my medical decision making (see chart for details).      Final Clinical Impressions(s) / ED Diagnoses   Final diagnoses:  Rib pain   Labs:    Imaging: DG ribs, CT chest without  Consults:  Therapeutics:  Discharge Meds:   Assessment/Plan: 56 year old male presents status post fall.  Patient with rib pain.  No acute findings on CT, lung sounds are clear.  Patient instructed to use ibuprofen or Tylenol, deep inspiration, return with any new or worsening signs or symptoms.  Patient verbalized understanding and agreement to today's plan had no further questions or concerns.      New Prescriptions New Prescriptions   No medications on file     Okey Regal, Hershal Coria 10/29/17 1513    Milton Ferguson, MD 10/29/17 1627

## 2017-10-29 NOTE — ED Notes (Signed)
Pt able to ambulate to and from restroom without difficulty. Pt given food.

## 2017-11-04 DIAGNOSIS — R739 Hyperglycemia, unspecified: Secondary | ICD-10-CM | POA: Diagnosis not present

## 2017-11-04 DIAGNOSIS — J4520 Mild intermittent asthma, uncomplicated: Secondary | ICD-10-CM | POA: Diagnosis not present

## 2017-11-04 DIAGNOSIS — M1712 Unilateral primary osteoarthritis, left knee: Secondary | ICD-10-CM | POA: Diagnosis not present

## 2017-11-04 DIAGNOSIS — E7800 Pure hypercholesterolemia, unspecified: Secondary | ICD-10-CM | POA: Diagnosis not present

## 2017-11-04 DIAGNOSIS — I10 Essential (primary) hypertension: Secondary | ICD-10-CM | POA: Diagnosis not present

## 2017-11-04 DIAGNOSIS — Z23 Encounter for immunization: Secondary | ICD-10-CM | POA: Diagnosis not present

## 2017-11-04 DIAGNOSIS — M109 Gout, unspecified: Secondary | ICD-10-CM | POA: Diagnosis not present

## 2017-11-04 DIAGNOSIS — E6609 Other obesity due to excess calories: Secondary | ICD-10-CM | POA: Diagnosis not present

## 2017-11-04 DIAGNOSIS — Z6834 Body mass index (BMI) 34.0-34.9, adult: Secondary | ICD-10-CM | POA: Diagnosis not present

## 2017-11-04 DIAGNOSIS — K219 Gastro-esophageal reflux disease without esophagitis: Secondary | ICD-10-CM | POA: Diagnosis not present

## 2017-11-04 DIAGNOSIS — K7460 Unspecified cirrhosis of liver: Secondary | ICD-10-CM | POA: Diagnosis not present

## 2017-11-04 DIAGNOSIS — G894 Chronic pain syndrome: Secondary | ICD-10-CM | POA: Diagnosis not present

## 2018-01-27 ENCOUNTER — Encounter (HOSPITAL_COMMUNITY): Payer: Self-pay

## 2018-01-27 ENCOUNTER — Other Ambulatory Visit: Payer: Self-pay

## 2018-01-27 ENCOUNTER — Emergency Department (HOSPITAL_COMMUNITY)
Admission: EM | Admit: 2018-01-27 | Discharge: 2018-01-27 | Disposition: A | Discharge status: Home / Self Care | Payer: Medicare Other | Attending: Emergency Medicine | Admitting: Emergency Medicine

## 2018-01-27 DIAGNOSIS — F17210 Nicotine dependence, cigarettes, uncomplicated: Secondary | ICD-10-CM | POA: Diagnosis not present

## 2018-01-27 DIAGNOSIS — G8929 Other chronic pain: Secondary | ICD-10-CM | POA: Insufficient documentation

## 2018-01-27 DIAGNOSIS — M25562 Pain in left knee: Secondary | ICD-10-CM | POA: Insufficient documentation

## 2018-01-27 DIAGNOSIS — I2510 Atherosclerotic heart disease of native coronary artery without angina pectoris: Secondary | ICD-10-CM | POA: Insufficient documentation

## 2018-01-27 DIAGNOSIS — J449 Chronic obstructive pulmonary disease, unspecified: Secondary | ICD-10-CM | POA: Insufficient documentation

## 2018-01-27 DIAGNOSIS — B354 Tinea corporis: Secondary | ICD-10-CM | POA: Diagnosis not present

## 2018-01-27 DIAGNOSIS — Z79899 Other long term (current) drug therapy: Secondary | ICD-10-CM | POA: Insufficient documentation

## 2018-01-27 DIAGNOSIS — J45909 Unspecified asthma, uncomplicated: Secondary | ICD-10-CM | POA: Insufficient documentation

## 2018-01-27 DIAGNOSIS — Z7982 Long term (current) use of aspirin: Secondary | ICD-10-CM | POA: Insufficient documentation

## 2018-01-27 DIAGNOSIS — R21 Rash and other nonspecific skin eruption: Secondary | ICD-10-CM | POA: Diagnosis present

## 2018-01-27 DIAGNOSIS — I10 Essential (primary) hypertension: Secondary | ICD-10-CM | POA: Insufficient documentation

## 2018-01-27 MED ORDER — CLOTRIMAZOLE 1 % EX CREA: TOPICAL_CREAM | CUTANEOUS | 0 refills | Status: DC

## 2018-01-27 MED ORDER — NAPROXEN 500 MG PO TABS: 500.0000 mg | ORAL_TABLET | Freq: Two times a day (BID) | ORAL | 0 refills | Status: DC

## 2018-01-27 NOTE — ED Notes (Signed)
ED Provider at bedside. 

## 2018-01-27 NOTE — ED Triage Notes (Signed)
Patient complains of left knee pain x 6 weeks, no trauma. States that he had arthroscopic knee surgery in 2018. Also wants rash checked on left hip

## 2018-01-27 NOTE — ED Provider Notes (Signed)
[facility] EMERGENCY DEPARTMENT Provider Note   CSN: [id number] Arrival date & time: 01/27/18  1003     History   Chief Complaint Chief Complaint  Patient presents with  . knee pain/thigh rash    HPI [name] [name] is a 57 y.o. male with a past medical history of COPD, gout, alcohol abuse, CAD, hypertension, who presents to ED for evaluation of multiple complaints.  His first complaint is left knee pain that has been intermittent for the past 10 years.  He had arthroscopic knee surgery for possible ligament tear 10 years ago and has been having intermittent pain since.  Pain has gotten progressively worse in the past 3-4 months.  He denies any reinjury.  He takes home narcotic pain medications for his back as well as a few doses of Advil with mild improvement in his symptoms.  He denies any red hot or tender joint, history of infected joint, injuries or falls, numbness in leg.   His next complaint is rash that he is concerned is ringworm on his left lateral thigh.  Symptoms have been there for about 4 weeks.  Reports the site is irritating.  No sick contacts with similar symptoms.  He has tried over-the-counter ointment with no improvement in his symptoms.  He denies any neck pain, new environmental or home exposures, rashes elsewhere, angioedema or trouble breathing.  HPI  Past Medical History:  Diagnosis Date  . Adenomatous colon polyp 2009  . Alcohol abuse    abstinent since 1992  . Arthritis    back  . Asthma   . Barrett's esophagus 05/22/2015  . Chronic back pain   . COPD (chronic obstructive pulmonary disease) ([place])   . Coronary artery disease    70-80% ostial LAD '10  . GERD (gastroesophageal reflux disease)    takes Nexium daily  . Gout    takes ALlopurinol daily  . Hemorrhoids   . History of bronchitis 2015  . History of kidney stones   . Hyperlipidemia    was given a script a month ago but scared to take it.Medical Md is aware  .  Hypertension    takes Losartan,Metoprolol,and Amlodipine daily  . Joint pain   . Lymphocytic colitis 2009  . Myocardial infarction [facility]) 2010   "light" (admit for CP 11/2009; ruled out for MI but had 70-80% ostail LAD stenosis and treated medically following NL perfusion study)  . NASH (nonalcoholic steatohepatitis)   . Peripheral vascular disease ([place])   . Personal history of colonic polyps 07/20/2002   Diminutive adenomas (3) 06/2002 diminutive adenoma (1) 2011   . Pre-diabetes   . Stroke ([place])   . TIA (transient ischemic attack)   . Urinary urgency     Patient Active Problem List   Diagnosis Date Noted  . 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 Chronic cholecystitis 08/03/2015  . Barrett's esophagus 05/22/2015  . Abnormal findings on radiological examination of gastrointestinal tract 05/02/2015  . RUQ abdominal pain 05/02/2015  . Steatosis 05/02/2015  . Chest pain 10/11/2013  . History of CVA (cerebrovascular accident) 10/11/2013  . CVA (cerebral infarction) 05/26/2012  . CAD (coronary artery disease) 05/26/2012  . HEMORRHOIDS-INTERNAL 03/12/2010  . GOUT 08/25/2007  . HYPERTENSION 08/25/2007  . GERD 08/25/2007  . History of adenomatous polyp of colon 07/20/2002    Past Surgical History:  Procedure Laterality Date  . ANEURYSM COILING  12/2010   cerebral  . BAND HEMORRHOIDECTOMY  2003  at sigmoidoscopy  . CARDIAC CATHETERIZATION  2010  . CHOLECYSTECTOMY  08/03/2015  . CHOLECYSTECTOMY N/A 08/03/2015   Procedure: LAPAROSCOPIC CHOLECYSTECTOMY WITH INTRAOPERATIVE CHOLANGIOGRAM;  Surgeon: Fanny Skates, MD;  Location: [facility];  Service: General;  Laterality: N/A;  . COLONOSCOPY  multiple  . ESOPHAGOGASTRODUODENOSCOPY    . KNEE ARTHROSCOPY WITH MEDIAL MENISECTOMY Left 05/20/2017   Procedure: LEFT KNEE ARTHROSCOPY WITH PARTIAL MEDIAL MENISCECTOMY;  Surgeon: Leandrew Koyanagi, MD;  Location: [facility];  Service: Orthopedics;  Laterality:  Left;  . LUMBAR SPINE SURGERY     x 2  . NASAL SINUS SURGERY    . TEE WITHOUT CARDIOVERSION  05/28/2012   Procedure: TRANSESOPHAGEAL ECHOCARDIOGRAM (TEE);  Surgeon: Lelon Perla, MD;  Location: [facility] ENDOSCOPY;  Service: Cardiovascular;  Laterality: N/A;       Home Medications    Prior to Admission medications   Medication Sig Start Date End Date Taking? Authorizing Provider  albuterol (PROVENTIL HFA;VENTOLIN HFA) 108 (90 Base) MCG/ACT inhaler Inhale into the lungs every 6 (six) hours as needed for wheezing or shortness of breath.    [provider]  allopurinol (ZYLOPRIM) 100 MG tablet Take 100 mg by mouth daily.    [provider]  amLODipine (NORVASC) 10 MG tablet Take 10 mg by mouth daily.    [provider]  aspirin 325 MG tablet Take 325 mg by mouth daily.    [provider]  clotrimazole (LOTRIMIN) 1 % cream Apply to affected area 2 times daily 01/27/18   Charon Smedberg, PA-C  esomeprazole (NEXIUM) 40 MG capsule Take 40 mg by mouth 2 (two) times daily.    [provider]  HYDROcodone-acetaminophen (NORCO) 5-325 MG tablet Take 1-2 tablets by mouth every 6 (six) hours as needed. Patient not taking: Reported on 07/16/2017 05/20/17   Leandrew Koyanagi, MD  losartan (COZAAR) 100 MG tablet Take 100 mg by mouth daily.    [provider]  metoprolol (LOPRESSOR) 100 MG tablet Take 100 mg by mouth 2 (two) times daily.    [provider]  Milk Thistle 500 MG CAPS Take 1,000 mg by mouth daily.     [provider]  Multiple Vitamin (MULITIVITAMIN WITH MINERALS) TABS Take 1 tablet by mouth daily.    [provider]  naproxen (NAPROSYN) 500 MG tablet Take 1 tablet (500 mg total) by mouth 2 (two) times daily. 01/27/18   Edsel Shives, PA-C  niacin (NIASPAN) 1000 MG CR tablet Take 1,000 mg by mouth at bedtime.    [provider]  nitroGLYCERIN (NITROSTAT) 0.4 MG SL tablet Place 0.4 mg under the tongue every 5 (five)  minutes as needed. For chest pain    [provider]  Omega-3 Fatty Acids (FISH OIL) 1200 MG CPDR Take 1,200 mg by mouth daily.    [provider]  ondansetron (ZOFRAN) 4 MG tablet Take 1-2 tablets (4-8 mg total) by mouth every 8 (eight) hours as needed for nausea or vomiting. Patient not taking: Reported on 07/16/2017 05/20/17   Leandrew Koyanagi, MD  promethazine (PHENERGAN) 25 MG tablet Take 1 tablet (25 mg total) by mouth every 6 (six) hours as needed for nausea. Patient not taking: Reported on 07/16/2017 05/20/17   Leandrew Koyanagi, MD  senna-docusate (SENOKOT S) 8.6-50 MG tablet Take 1 tablet by mouth at bedtime as needed. Patient not taking: Reported on 07/16/2017 05/20/17   Leandrew Koyanagi, MD  traMADol (ULTRAM) 50 MG tablet Take 1 tablet (50 mg  total) by mouth every 6 (six) hours as needed. Patient not taking: Reported on 07/16/2017 06/02/17   Leandrew Koyanagi, MD  traZODone (DESYREL) 50 MG tablet Take 50 mg by mouth at bedtime.    [provider]    Family History Family History  Problem Relation Age of Onset  . Liver disease Mother   . Liver cancer Mother   . Breast cancer Mother   . Cancer Mother   . Hypertension Mother   . Heart attack Mother   . Cancer Father   . Heart disease Father        before age 36  . Hyperlipidemia Father   . Hypertension Father   . Heart attack Father   . Heart disease Unknown        multiple family members on maternal and paternal side of family  . Diabetes Sister   . Hyperlipidemia Sister   . Hypertension Sister   . Diabetes Paternal Grandmother   . Hypertension Brother   . Colon cancer Neg Hx     Social History Social History   Tobacco Use  . Smoking status: Current Every Day Smoker    Packs/day: 1.00    Years: 4.00    Pack years: 4.00    Types: Cigarettes  . Smokeless tobacco: Never Used  Substance Use Topics  . Alcohol use: No    Alcohol/week: 0.0 oz    Comment: no alcohol in 53yr   . Drug use: No      Allergies   Penicillins   Review of Systems Review of Systems  Constitutional: Negative for chills and fever.  HENT: Negative for facial swelling and trouble swallowing.   Respiratory: Negative for shortness of breath.   Musculoskeletal: Positive for arthralgias and gait problem. Negative for back pain, joint swelling, neck pain and neck stiffness.  Skin: Positive for rash. Negative for color change and wound.  Neurological: Negative for weakness and numbness.     Physical Exam Updated Vital Signs BP (!) 180/89   Pulse 75   Temp 98.3 F (36.8 C) (Oral)   Resp 16   SpO2 100%   Physical Exam  Constitutional: He appears well-developed and well-nourished. No distress.  HENT:  Head: Normocephalic and atraumatic.  Eyes: Conjunctivae and EOM are normal. No scleral icterus.  Neck: Normal range of motion.  Pulmonary/Chest: Effort normal. No respiratory distress.  Musculoskeletal: Normal range of motion. He exhibits tenderness. He exhibits no edema or deformity.  Tenderness to palpation of the left knee with no visible deformity, erythema, edema or changes in range of motion.  Able to perform straight leg raise.  Sensation intact to light touch of bilateral lower extremities.  Plus DP pulse noted bilaterally.  Neurological: He is alert.  Skin: Rash noted. He is not diaphoretic. There is erythema.  Annular, scaly erythematous rash noted to left lateral thigh that is approximately 7 cm in size.  There are 2 satellite lesions noted. Appears c/w tinea.  Psychiatric: He has a normal mood and affect.  Nursing note and vitals reviewed.    ED Treatments / Results  Labs (all labs ordered are listed, but only abnormal results are displayed) Labs Reviewed - No data to display  EKG  EKG Interpretation None       Radiology No results found.  Procedures Procedures (including critical care time)  Medications Ordered in ED Medications - No data to display   Initial  Impression / Assessment and Plan / ED Course  I have  reviewed the triage vital signs and the nursing notes.  Pertinent labs & imaging results that were available during my care of the patient were reviewed by me and considered in my medical decision making (see chart for details).     Patient presents to ED for evaluation of multiple complaints.  His first complaint is chronic left knee pain.  He had arthroscopic knee surgery approximately 10 years ago and has been having intermittent pain since.  No new injuries or falls that would warrant reimaging at this time.  No significant edema or signs of infection noted.  Pulses are intact with low suspicion for vascular cause of his symptoms.  No calf tenderness present.  His next complaint is rash to left lateral thigh which appears consistent with tinea. Pt has a patent airway without stridor and is handling secretions without difficulty; no angioedema. No blisters, no pustules, no warmth, no draining sinus tracts, no superficial abscesses, no bullous impetigo, no vesicles, no desquamation, no target lesions with dusky purpura or a central bulla. Not tender to touch. No concern for superimposed infection. No concern for SJS, TEN, TSS, tick borne illness, syphilis or other life-threatening condition.  We will give anti-inflammatories for knee pain, follow-up with orthopedist and antifungals to help with what appears to be tinea corporis.  Patient appears stable for discharge at this time.  Strict return precautions given.  Portions of this note were generated with [employer]. Dictation errors may occur despite best attempts at proofreading.   Final Clinical Impressions(s) / ED Diagnoses   Final diagnoses:  Tinea corporis  Chronic pain of left knee    ED Discharge Orders        Ordered    clotrimazole (LOTRIMIN) 1 % cream     01/27/18 1150    naproxen (NAPROSYN) 500 MG tablet  2 times daily     01/27/18 1150       Delia Heady,  PA-C 01/27/18 1157    Jola Schmidt, MD 01/27/18 1701

## 2018-01-27 NOTE — Discharge Instructions (Signed)
Please read attached information regarding your condition. Take naproxen as needed for pain and swelling of your knee. Apply clotrimazole cream to rash as directed. Follow-up with orthopedist listed below or your orthopedist for further evaluation. Return to ED for worsening symptoms, numbness in legs, red hot or tender joint, injuries or falls or worsening of rash.

## 2018-01-27 NOTE — ED Notes (Signed)
Pt reports he had arthroscopic knee surgery in October or November of 2018 with increasing knee pain X6 weeks. Pt states that he has not had follow up because he cannot remember the name of the Ortho MD that did his procedure. Also states he has " what looks like ring worm" on his left thigh. He states it has been there for 6 months or more.

## 2018-03-01 ENCOUNTER — Ambulatory Visit (INDEPENDENT_AMBULATORY_CARE_PROVIDER_SITE_OTHER): Payer: Medicare Other | Admitting: Orthopaedic Surgery

## 2018-03-01 ENCOUNTER — Ambulatory Visit (INDEPENDENT_AMBULATORY_CARE_PROVIDER_SITE_OTHER): Payer: Medicare Other

## 2018-03-01 ENCOUNTER — Encounter (INDEPENDENT_AMBULATORY_CARE_PROVIDER_SITE_OTHER): Payer: Self-pay | Admitting: Orthopaedic Surgery

## 2018-03-01 DIAGNOSIS — G8929 Other chronic pain: Secondary | ICD-10-CM | POA: Diagnosis not present

## 2018-03-01 DIAGNOSIS — M25562 Pain in left knee: Secondary | ICD-10-CM

## 2018-03-01 MED ORDER — LIDOCAINE HCL 1 % IJ SOLN
2.0000 mL | INTRAMUSCULAR | Status: AC | PRN
  Administered 2018-03-01: 2 mL

## 2018-03-01 MED ORDER — METHYLPREDNISOLONE ACETATE 40 MG/ML IJ SUSP
40.0000 mg | INTRAMUSCULAR | Status: AC | PRN
  Administered 2018-03-01: 40 mg via INTRA_ARTICULAR

## 2018-03-01 MED ORDER — BUPIVACAINE HCL 0.25 % IJ SOLN
2.0000 mL | INTRAMUSCULAR | Status: AC | PRN
  Administered 2018-03-01: 2 mL via INTRA_ARTICULAR

## 2018-03-01 NOTE — Progress Notes (Signed)
Office Visit Note   Patient: [name] [name]           Date of Birth: 09-Dec-1961           MRN: [id number] Visit Date: 03/01/2018              Requested by: Shirline Frees, MD [address] [place], [place] [id number] PCP: Shirline Frees, MD   Assessment & Plan: Visit Diagnoses:  1. Chronic pain of left knee     Plan: Today we proceeded with a left knee intra-articular cortisone injection.  He is encouraged to rest ice and elevate as much possible today and tomorrow.  He will call us in the next few weeks if he is not any better we may entertain repeating the MRI of his left knee.  He will call with concerns or questions in the meantime.  Follow-up with [name] on an as-needed basis.  Follow-Up Instructions: Return if symptoms worsen or fail to improve.   Orders:  Orders Placed This Encounter  Procedures  . Large Joint Inj: L knee  . XR KNEE 3 VIEW LEFT   No orders of the defined types were placed in this encounter.     Procedures: Large Joint Inj: L knee on 03/01/2018 8:47 AM Indications: pain Details: 22 G needle, anterolateral approach Medications: 2 mL lidocaine 1 %; 2 mL bupivacaine 0.25 %; 40 mg methylPREDNISolone acetate 40 MG/ML      Clinical Data: No additional findings.   Subjective: Chief Complaint  Patient presents with  . Left Knee - Pain    HPI [name] [name] is a pleasant 57 year old gentleman who presents our clinic today with recurrent left knee pain.  History of left knee arthroscopic debridement medial meniscus back in May 2018.  Doing well up until the past few months when the temperature became colder.  No injury or change in activity, however.  All of his pain is medial aspect.  He describes this as a toothache.  Occasional popping.  No locking or catching.  No instability.  Pain is worse going from a seated to standing position as well as walking for long periods of time.  The only thing that makes this better is rest.  He is tried oral  anti-inflammatories without relief of symptoms.  Of note he is on Norco from his primary care provider for his back.  He has not had a cortisone injection in the past.  Review of Systems as detailed in HPI.  All others reviewed and are negative.   Objective: Vital Signs: There were no vitals taken for this visit.  Physical Exam well-developed well-nourished gentleman no acute distress.  Alert and oriented x3.  Ortho Exam examination of his left knee shows no effusion.  Range of motion 0-120 degrees.  He does have medial joint line tenderness.  Minimal patellofemoral crepitus.  Ligaments are stable.  He is rest intact distally.  Specialty Comments:  No specialty comments available.  Imaging: Xr Knee 3 View Left  Result Date: 03/01/2018 X-rays of the left knee show minimal medial compartment narrowing.    PMFS History: Patient Active Problem List   Diagnosis Date Noted  . Chronic pain of left knee 03/01/2018  . 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 Chronic cholecystitis 08/03/2015  . Barrett's esophagus 05/22/2015  . Abnormal findings on radiological examination of gastrointestinal tract 05/02/2015  . RUQ abdominal pain 05/02/2015  . Steatosis 05/02/2015  . Chest pain 10/11/2013  .  History of CVA (cerebrovascular accident) 10/11/2013  . CVA (cerebral infarction) 05/26/2012  . CAD (coronary artery disease) 05/26/2012  . HEMORRHOIDS-INTERNAL 03/12/2010  . GOUT 08/25/2007  . HYPERTENSION 08/25/2007  . GERD 08/25/2007  . History of adenomatous polyp of colon 07/20/2002   Past Medical History:  Diagnosis Date  . Adenomatous colon polyp 2009  . Alcohol abuse    abstinent since 1992  . Arthritis    back  . Asthma   . Barrett's esophagus 05/22/2015  . Chronic back pain   . COPD (chronic obstructive pulmonary disease) ([place])   . Coronary artery disease    70-80% ostial LAD '10  . GERD (gastroesophageal reflux disease)    takes Nexium daily  .  Gout    takes ALlopurinol daily  . Hemorrhoids   . History of bronchitis 2015  . History of kidney stones   . Hyperlipidemia    was given a script a month ago but scared to take it.Medical Md is aware  . Hypertension    takes Losartan,Metoprolol,and Amlodipine daily  . Joint pain   . Lymphocytic colitis 2009  . Myocardial infarction [facility]) 2010   "light" (admit for CP 11/2009; ruled out for MI but had 70-80% ostail LAD stenosis and treated medically following NL perfusion study)  . NASH (nonalcoholic steatohepatitis)   . Peripheral vascular disease ([place])   . Personal history of colonic polyps 07/20/2002   Diminutive adenomas (3) 06/2002 diminutive adenoma (1) 2011   . Pre-diabetes   . Stroke ([place])   . TIA (transient ischemic attack)   . Urinary urgency     Family History  Problem Relation Age of Onset  . Liver disease Mother   . Liver cancer Mother   . Breast cancer Mother   . Cancer Mother   . Hypertension Mother   . Heart attack Mother   . Cancer Father   . Heart disease Father        before age 49  . Hyperlipidemia Father   . Hypertension Father   . Heart attack Father   . Heart disease Unknown        multiple family members on maternal and paternal side of family  . Diabetes Sister   . Hyperlipidemia Sister   . Hypertension Sister   . Diabetes Paternal Grandmother   . Hypertension Brother   . Colon cancer Neg Hx     Past Surgical History:  Procedure Laterality Date  . ANEURYSM COILING  12/2010   cerebral  . BAND HEMORRHOIDECTOMY  2003   at sigmoidoscopy  . CARDIAC CATHETERIZATION  2010  . CHOLECYSTECTOMY  08/03/2015  . CHOLECYSTECTOMY N/A 08/03/2015   Procedure: LAPAROSCOPIC CHOLECYSTECTOMY WITH INTRAOPERATIVE CHOLANGIOGRAM;  Surgeon: Fanny Skates, MD;  Location: [facility];  Service: General;  Laterality: N/A;  . COLONOSCOPY  multiple  . ESOPHAGOGASTRODUODENOSCOPY    . KNEE ARTHROSCOPY WITH MEDIAL MENISECTOMY Left 05/20/2017   Procedure: LEFT KNEE ARTHROSCOPY WITH  PARTIAL MEDIAL MENISCECTOMY;  Surgeon: Leandrew Koyanagi, MD;  Location: [facility];  Service: Orthopedics;  Laterality: Left;  . LUMBAR SPINE SURGERY     x 2  . NASAL SINUS SURGERY    . TEE WITHOUT CARDIOVERSION  05/28/2012   Procedure: TRANSESOPHAGEAL ECHOCARDIOGRAM (TEE);  Surgeon: Lelon Perla, MD;  Location: [facility] ENDOSCOPY;  Service: Cardiovascular;  Laterality: N/A;   Social History   Occupational History  . Occupation: Disabled    Employer: UNEMPLOYED  Tobacco Use  . Smoking status: Current Every Day  Smoker    Packs/day: 1.00    Years: 4.00    Pack years: 4.00    Types: Cigarettes  . Smokeless tobacco: Never Used  Substance and Sexual Activity  . Alcohol use: No    Alcohol/week: 0.0 oz    Comment: no alcohol in 5yr   . Drug use: No  . Sexual activity: Not on file

## 2018-03-16 ENCOUNTER — Ambulatory Visit (INDEPENDENT_AMBULATORY_CARE_PROVIDER_SITE_OTHER): Payer: Medicare Other | Admitting: Orthopaedic Surgery

## 2018-03-25 ENCOUNTER — Telehealth (INDEPENDENT_AMBULATORY_CARE_PROVIDER_SITE_OTHER): Payer: Self-pay | Admitting: Orthopaedic Surgery

## 2018-03-25 ENCOUNTER — Other Ambulatory Visit (INDEPENDENT_AMBULATORY_CARE_PROVIDER_SITE_OTHER): Payer: Self-pay

## 2018-03-25 DIAGNOSIS — M25562 Pain in left knee: Principal | ICD-10-CM

## 2018-03-25 DIAGNOSIS — G8929 Other chronic pain: Secondary | ICD-10-CM

## 2018-03-25 NOTE — Telephone Encounter (Signed)
ORDER MADE 

## 2018-03-25 NOTE — Telephone Encounter (Signed)
Patient said left knee pain has not improved since the injection he had on 3/4. He needs to know what the next steps are, he said he remembers the possibility of an MRI. Please call ball back # [phone]

## 2018-03-25 NOTE — Telephone Encounter (Signed)
Mri of left knee please and thank you!

## 2018-03-25 NOTE — Telephone Encounter (Signed)
Please advise 

## 2018-03-25 NOTE — Telephone Encounter (Signed)
Called patient to advise  °

## 2018-04-05 ENCOUNTER — Ambulatory Visit
Admission: RE | Admit: 2018-04-05 | Discharge: 2018-04-05 | Disposition: A | Discharge status: Home / Self Care | Payer: Medicare Other | Source: Ambulatory Visit | Attending: Physician Assistant | Admitting: Physician Assistant

## 2018-04-05 ENCOUNTER — Other Ambulatory Visit: Payer: Medicare Other

## 2018-04-05 DIAGNOSIS — G8929 Other chronic pain: Secondary | ICD-10-CM

## 2018-04-05 DIAGNOSIS — M25562 Pain in left knee: Secondary | ICD-10-CM | POA: Diagnosis not present

## 2018-04-06 NOTE — Progress Notes (Signed)
Fu appt to discuss mri

## 2018-04-07 NOTE — Progress Notes (Signed)
APPT MADE

## 2018-04-12 ENCOUNTER — Telehealth (INDEPENDENT_AMBULATORY_CARE_PROVIDER_SITE_OTHER): Payer: Self-pay

## 2018-04-12 ENCOUNTER — Ambulatory Visit (INDEPENDENT_AMBULATORY_CARE_PROVIDER_SITE_OTHER): Payer: Medicare Other | Admitting: Orthopaedic Surgery

## 2018-04-12 ENCOUNTER — Encounter (INDEPENDENT_AMBULATORY_CARE_PROVIDER_SITE_OTHER): Payer: Self-pay | Admitting: Orthopaedic Surgery

## 2018-04-12 DIAGNOSIS — M1712 Unilateral primary osteoarthritis, left knee: Secondary | ICD-10-CM

## 2018-04-12 DIAGNOSIS — M25562 Pain in left knee: Secondary | ICD-10-CM

## 2018-04-12 DIAGNOSIS — G8929 Other chronic pain: Secondary | ICD-10-CM

## 2018-04-12 MED ORDER — DICLOFENAC SODIUM 1 % TD GEL: 2.0000 g | Freq: Four times a day (QID) | TRANSDERMAL | 5 refills | Status: DC

## 2018-04-12 NOTE — Telephone Encounter (Signed)
Submitted application online for Monovisc injection, left knee.

## 2018-04-12 NOTE — Telephone Encounter (Signed)
Noted  

## 2018-04-12 NOTE — Telephone Encounter (Signed)
Please submit application in for [name] [name] [name] [name]

## 2018-04-12 NOTE — Progress Notes (Signed)
Office Visit Note   Patient: [name] [name]           Date of Birth: 1961-02-12           MRN: [id number] Visit Date: 04/12/2018              Requested by: Shirline Frees, MD [address], [place] [address] PCP: Shirline Frees, MD   Assessment & Plan: Visit Diagnoses:  1. Chronic pain of left knee   2. Unilateral primary osteoarthritis, left knee     Plan: MRI findings are consistent with postoperative partial medial meniscectomy and mild medial compartment degenerative joint disease.  Patient is on opiate medicines for chronic back pain.  I think this certainly contributes to his increased knee pain.  I do not think that he is got a new meniscal tear.  I would like to try HA injection to see if this gives him better relief than cortisone.  Patient is in agreement.  Follow-up for the injection  Follow-Up Instructions: Return if symptoms worsen or fail to improve.   Orders:  No orders of the defined types were placed in this encounter.  Meds ordered this encounter  Medications  . diclofenac sodium (VOLTAREN) 1 % GEL    Sig: Apply 2 g topically 4 (four) times daily.    Dispense:  1 Tube    Refill:  5      Procedures: No procedures performed   Clinical Data: No additional findings.   Subjective: Chief Complaint  Patient presents with  . Left Knee - Pain    Patient comes in today for MRI of his left knee.  He endorses chronic constant pain rather than mechanical symptoms.  Cortisone injection gave him temporary relief.   Review of Systems   Objective: Vital Signs: There were no vitals taken for this visit.  Physical Exam  Ortho Exam Left knee exam shows no joint.  Medial joint line tenderness.  Negative [place]. Specialty Comments:  No specialty comments available.  Imaging: No results found.   PMFS History: Patient Active Problem List   Diagnosis Date Noted  . Chronic pain of left knee 03/01/2018  . Derangement of  posterior horn of medial meniscus, left 04/24/2017  . Chronic cholecystitis 08/03/2015  . Barrett's esophagus 05/22/2015  . Abnormal findings on radiological examination of gastrointestinal tract 05/02/2015  . RUQ abdominal pain 05/02/2015  . Steatosis 05/02/2015  . Chest pain 10/11/2013  . History of CVA (cerebrovascular accident) 10/11/2013  . CVA (cerebral infarction) 05/26/2012  . CAD (coronary artery disease) 05/26/2012  . HEMORRHOIDS-INTERNAL 03/12/2010  . GOUT 08/25/2007  . HYPERTENSION 08/25/2007  . GERD 08/25/2007  . History of adenomatous polyp of colon 07/20/2002   Past Medical History:  Diagnosis Date  . Adenomatous colon polyp 2009  . Alcohol abuse    abstinent since 1992  . Arthritis    back  . Asthma   . Barrett's esophagus 05/22/2015  . Chronic back pain   . COPD (chronic obstructive pulmonary disease) ([place])   . Coronary artery disease    70-80% ostial LAD '10  . GERD (gastroesophageal reflux disease)    takes Nexium daily  . Gout    takes ALlopurinol daily  . Hemorrhoids   . History of bronchitis 2015  . History of kidney stones   . Hyperlipidemia    was given a script a month ago but scared to take it.Medical Md is aware  . Hypertension    takes  Losartan,Metoprolol,and Amlodipine daily  . Joint pain   . Lymphocytic colitis 2009  . Myocardial infarction [facility]) 2010   "light" (admit for CP 11/2009; ruled out for MI but had 70-80% ostail LAD stenosis and treated medically following NL perfusion study)  . NASH (nonalcoholic steatohepatitis)   . Peripheral vascular disease ([place])   . Personal history of colonic polyps 07/20/2002   Diminutive adenomas (3) 06/2002 diminutive adenoma (1) 2011   . Pre-diabetes   . Stroke ([place])   . TIA (transient ischemic attack)   . Urinary urgency     Family History  Problem Relation Age of Onset  . Liver disease Mother   . Liver cancer Mother   . Breast cancer Mother   . Cancer Mother   . Hypertension Mother   . Heart  attack Mother   . Cancer Father   . Heart disease Father        before age 44  . Hyperlipidemia Father   . Hypertension Father   . Heart attack Father   . Heart disease Unknown        multiple family members on maternal and paternal side of family  . Diabetes Sister   . Hyperlipidemia Sister   . Hypertension Sister   . Diabetes Paternal Grandmother   . Hypertension Brother   . Colon cancer Neg Hx     Past Surgical History:  Procedure Laterality Date  . ANEURYSM COILING  12/2010   cerebral  . BAND HEMORRHOIDECTOMY  2003   at sigmoidoscopy  . CARDIAC CATHETERIZATION  2010  . CHOLECYSTECTOMY  08/03/2015  . CHOLECYSTECTOMY N/A 08/03/2015   Procedure: LAPAROSCOPIC CHOLECYSTECTOMY WITH INTRAOPERATIVE CHOLANGIOGRAM;  Surgeon: Fanny Skates, MD;  Location: [facility];  Service: General;  Laterality: N/A;  . COLONOSCOPY  multiple  . ESOPHAGOGASTRODUODENOSCOPY    . KNEE ARTHROSCOPY WITH MEDIAL MENISECTOMY Left 05/20/2017   Procedure: LEFT KNEE ARTHROSCOPY WITH PARTIAL MEDIAL MENISCECTOMY;  Surgeon: Leandrew Koyanagi, MD;  Location: [facility];  Service: Orthopedics;  Laterality: Left;  . LUMBAR SPINE SURGERY     x 2  . NASAL SINUS SURGERY    . TEE WITHOUT CARDIOVERSION  05/28/2012   Procedure: TRANSESOPHAGEAL ECHOCARDIOGRAM (TEE);  Surgeon: Lelon Perla, MD;  Location: [facility] ENDOSCOPY;  Service: Cardiovascular;  Laterality: N/A;   Social History   Occupational History  . Occupation: Disabled    Employer: UNEMPLOYED  Tobacco Use  . Smoking status: Current Every Day Smoker    Packs/day: 1.00    Years: 4.00    Pack years: 4.00    Types: Cigarettes  . Smokeless tobacco: Never Used  Substance and Sexual Activity  . Alcohol use: No    Alcohol/week: 0.0 oz    Comment: no alcohol in 56yr   . Drug use: No  . Sexual activity: Not on file

## 2018-06-11 ENCOUNTER — Telehealth (INDEPENDENT_AMBULATORY_CARE_PROVIDER_SITE_OTHER): Payer: Self-pay | Admitting: Radiology

## 2018-06-11 NOTE — Telephone Encounter (Signed)
Approved for monovisc injection. Left message for patient to call back and schedule.

## 2018-08-09 DIAGNOSIS — J4520 Mild intermittent asthma, uncomplicated: Secondary | ICD-10-CM | POA: Diagnosis not present

## 2018-08-09 DIAGNOSIS — F5101 Primary insomnia: Secondary | ICD-10-CM | POA: Diagnosis not present

## 2018-08-09 DIAGNOSIS — E7800 Pure hypercholesterolemia, unspecified: Secondary | ICD-10-CM | POA: Diagnosis not present

## 2018-08-09 DIAGNOSIS — G894 Chronic pain syndrome: Secondary | ICD-10-CM | POA: Diagnosis not present

## 2018-08-09 DIAGNOSIS — R0789 Other chest pain: Secondary | ICD-10-CM | POA: Diagnosis not present

## 2018-08-09 DIAGNOSIS — M109 Gout, unspecified: Secondary | ICD-10-CM | POA: Diagnosis not present

## 2018-08-09 DIAGNOSIS — K7460 Unspecified cirrhosis of liver: Secondary | ICD-10-CM | POA: Diagnosis not present

## 2018-08-09 DIAGNOSIS — I10 Essential (primary) hypertension: Secondary | ICD-10-CM | POA: Diagnosis not present

## 2018-08-09 DIAGNOSIS — E6609 Other obesity due to excess calories: Secondary | ICD-10-CM | POA: Diagnosis not present

## 2018-08-09 DIAGNOSIS — Z125 Encounter for screening for malignant neoplasm of prostate: Secondary | ICD-10-CM | POA: Diagnosis not present

## 2018-08-09 DIAGNOSIS — K219 Gastro-esophageal reflux disease without esophagitis: Secondary | ICD-10-CM | POA: Diagnosis not present

## 2018-08-09 DIAGNOSIS — R7303 Prediabetes: Secondary | ICD-10-CM | POA: Diagnosis not present

## 2018-08-16 ENCOUNTER — Encounter: Payer: Self-pay | Admitting: Internal Medicine

## 2018-10-06 DIAGNOSIS — H903 Sensorineural hearing loss, bilateral: Secondary | ICD-10-CM | POA: Diagnosis not present

## 2018-10-06 DIAGNOSIS — H9313 Tinnitus, bilateral: Secondary | ICD-10-CM | POA: Diagnosis not present

## 2018-11-22 ENCOUNTER — Other Ambulatory Visit: Payer: Self-pay

## 2018-11-22 ENCOUNTER — Encounter (HOSPITAL_COMMUNITY): Payer: Self-pay | Admitting: Emergency Medicine

## 2018-11-22 ENCOUNTER — Emergency Department (HOSPITAL_COMMUNITY)
Admission: EM | Admit: 2018-11-22 | Discharge: 2018-11-22 | Disposition: A | Discharge status: Home / Self Care | Payer: Medicare Other | Source: Home / Self Care | Attending: Emergency Medicine | Admitting: Emergency Medicine

## 2018-11-22 ENCOUNTER — Emergency Department (HOSPITAL_COMMUNITY): Payer: Medicare Other

## 2018-11-22 DIAGNOSIS — A419 Sepsis, unspecified organism: Secondary | ICD-10-CM | POA: Diagnosis not present

## 2018-11-22 DIAGNOSIS — J189 Pneumonia, unspecified organism: Secondary | ICD-10-CM | POA: Diagnosis not present

## 2018-11-22 DIAGNOSIS — I2510 Atherosclerotic heart disease of native coronary artery without angina pectoris: Secondary | ICD-10-CM | POA: Insufficient documentation

## 2018-11-22 DIAGNOSIS — J441 Chronic obstructive pulmonary disease with (acute) exacerbation: Secondary | ICD-10-CM | POA: Diagnosis not present

## 2018-11-22 DIAGNOSIS — I10 Essential (primary) hypertension: Secondary | ICD-10-CM

## 2018-11-22 DIAGNOSIS — Z79899 Other long term (current) drug therapy: Secondary | ICD-10-CM | POA: Insufficient documentation

## 2018-11-22 DIAGNOSIS — E876 Hypokalemia: Secondary | ICD-10-CM | POA: Diagnosis not present

## 2018-11-22 DIAGNOSIS — J181 Lobar pneumonia, unspecified organism: Secondary | ICD-10-CM | POA: Diagnosis not present

## 2018-11-22 DIAGNOSIS — R000 Tachycardia, unspecified: Secondary | ICD-10-CM | POA: Diagnosis not present

## 2018-11-22 DIAGNOSIS — J9601 Acute respiratory failure with hypoxia: Secondary | ICD-10-CM | POA: Diagnosis not present

## 2018-11-22 DIAGNOSIS — R0602 Shortness of breath: Secondary | ICD-10-CM | POA: Diagnosis not present

## 2018-11-22 DIAGNOSIS — F17210 Nicotine dependence, cigarettes, uncomplicated: Secondary | ICD-10-CM | POA: Insufficient documentation

## 2018-11-22 DIAGNOSIS — J440 Chronic obstructive pulmonary disease with acute lower respiratory infection: Secondary | ICD-10-CM | POA: Diagnosis not present

## 2018-11-22 DIAGNOSIS — R0789 Other chest pain: Secondary | ICD-10-CM | POA: Diagnosis not present

## 2018-11-22 DIAGNOSIS — Z7982 Long term (current) use of aspirin: Secondary | ICD-10-CM | POA: Insufficient documentation

## 2018-11-22 LAB — CBC
HCT: 47.6 % (ref 39.0–52.0)
Hemoglobin: 15.1 g/dL (ref 13.0–17.0)
MCH: 28.5 pg (ref 26.0–34.0)
MCHC: 31.7 g/dL (ref 30.0–36.0)
MCV: 90 fL (ref 80.0–100.0)
NRBC: 0 % (ref 0.0–0.2)
Platelets: 152 10*3/uL (ref 150–400)
RBC: 5.29 MIL/uL (ref 4.22–5.81)
RDW: 11.9 % (ref 11.5–15.5)
WBC: 10.8 10*3/uL — ABNORMAL HIGH (ref 4.0–10.5)

## 2018-11-22 LAB — BASIC METABOLIC PANEL
ANION GAP: 10 (ref 5–15)
BUN: 13 mg/dL (ref 6–20)
CALCIUM: 9.2 mg/dL (ref 8.9–10.3)
CO2: 22 mmol/L (ref 22–32)
CREATININE: 0.81 mg/dL (ref 0.61–1.24)
Chloride: 104 mmol/L (ref 98–111)
GLUCOSE: 114 mg/dL — AB (ref 70–99)
Potassium: 3.8 mmol/L (ref 3.5–5.1)
Sodium: 136 mmol/L (ref 135–145)

## 2018-11-22 LAB — I-STAT TROPONIN, ED: TROPONIN I, POC: 0 ng/mL (ref 0.00–0.08)

## 2018-11-22 MED ORDER — PREDNISONE 10 MG PO TABS: 40.0000 mg | ORAL_TABLET | Freq: Every day | ORAL | 0 refills | Status: DC

## 2018-11-22 MED ORDER — IPRATROPIUM-ALBUTEROL 0.5-2.5 (3) MG/3ML IN SOLN
3.0000 mL | Freq: Once | RESPIRATORY_TRACT | Status: AC
Start: 2018-11-22 — End: 2018-11-22
  Administered 2018-11-22: 3 mL via RESPIRATORY_TRACT
  Filled 2018-11-22: qty 3

## 2018-11-22 MED ORDER — IPRATROPIUM-ALBUTEROL 0.5-2.5 (3) MG/3ML IN SOLN: 3.0000 mL | Freq: Three times a day (TID) | RESPIRATORY_TRACT | 0 refills | Status: DC | PRN

## 2018-11-22 MED ORDER — PREDNISONE 20 MG PO TABS
60.0000 mg | ORAL_TABLET | Freq: Once | ORAL | Status: AC
  Administered 2018-11-22: 60 mg via ORAL
  Filled 2018-11-22: qty 3

## 2018-11-22 NOTE — ED Provider Notes (Signed)
[facility] EMERGENCY DEPARTMENT Provider Note   CSN: [id number] Arrival date & time: 11/22/18  0901     History   Chief Complaint Chief Complaint  Patient presents with  . Shortness of Breath  . URI    HPI [name] [name] is a 57 y.o. male.  57 year old male with history of bronchitis and COPD, and daily smoker presents with complaint of cough (non productive) and congestion with wheezing and shortness of breath x5 days.  Patient is using his albuterol inhaler with minimal relief.  Cough is nonproductive, denies pain in his chest, swelling of his legs, fevers or chills.  Denies previous hospitalizations related to COPD or breathing difficulty.  No other complaints or concerns.     Past Medical History:  Diagnosis Date  . Adenomatous colon polyp 2009  . Alcohol abuse    abstinent since 1992  . Arthritis    back  . Asthma   . Barrett's esophagus 05/22/2015  . Chronic back pain   . COPD (chronic obstructive pulmonary disease) ([place])   . Coronary artery disease    70-80% ostial LAD '10  . GERD (gastroesophageal reflux disease)    takes Nexium daily  . Gout    takes ALlopurinol daily  . Hemorrhoids   . History of bronchitis 2015  . History of kidney stones   . Hyperlipidemia    was given a script a month ago but scared to take it.Medical Md is aware  . Hypertension    takes Losartan,Metoprolol,and Amlodipine daily  . Joint pain   . Lymphocytic colitis 2009  . Myocardial infarction [facility]) 2010   "light" (admit for CP 11/2009; ruled out for MI but had 70-80% ostail LAD stenosis and treated medically following NL perfusion study)  . NASH (nonalcoholic steatohepatitis)   . Peripheral vascular disease ([place])   . Personal history of colonic polyps 07/20/2002   Diminutive adenomas (3) 06/2002 diminutive adenoma (1) 2011   . Pre-diabetes   . Stroke ([place])   . TIA (transient ischemic attack)   . Urinary urgency     Patient Active Problem List   Diagnosis Date Noted  . Chronic pain of left knee 03/01/2018  . 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 Chronic cholecystitis 08/03/2015  . Barrett's esophagus 05/22/2015  . Abnormal findings on radiological examination of gastrointestinal tract 05/02/2015  . RUQ abdominal pain 05/02/2015  . Steatosis 05/02/2015  . Chest pain 10/11/2013  . History of CVA (cerebrovascular accident) 10/11/2013  . CVA (cerebral infarction) 05/26/2012  . CAD (coronary artery disease) 05/26/2012  . HEMORRHOIDS-INTERNAL 03/12/2010  . GOUT 08/25/2007  . HYPERTENSION 08/25/2007  . GERD 08/25/2007  . History of adenomatous polyp of colon 07/20/2002    Past Surgical History:  Procedure Laterality Date  . ANEURYSM COILING  12/2010   cerebral  . BAND HEMORRHOIDECTOMY  2003   at sigmoidoscopy  . CARDIAC CATHETERIZATION  2010  . CHOLECYSTECTOMY  08/03/2015  . CHOLECYSTECTOMY N/A 08/03/2015   Procedure: LAPAROSCOPIC CHOLECYSTECTOMY WITH INTRAOPERATIVE CHOLANGIOGRAM;  Surgeon: Fanny Skates, MD;  Location: [facility];  Service: General;  Laterality: N/A;  . COLONOSCOPY  multiple  . ESOPHAGOGASTRODUODENOSCOPY    . KNEE ARTHROSCOPY WITH MEDIAL MENISECTOMY Left 05/20/2017   Procedure: LEFT KNEE ARTHROSCOPY WITH PARTIAL MEDIAL MENISCECTOMY;  Surgeon: Leandrew Koyanagi, MD;  Location: [facility];  Service: Orthopedics;  Laterality: Left;  . LUMBAR SPINE SURGERY     x 2  . NASAL SINUS SURGERY    .  TEE WITHOUT CARDIOVERSION  05/28/2012   Procedure: TRANSESOPHAGEAL ECHOCARDIOGRAM (TEE);  Surgeon: Lelon Perla, MD;  Location: [facility] ENDOSCOPY;  Service: Cardiovascular;  Laterality: N/A;        Home Medications    Prior to Admission medications   Medication Sig Start Date End Date Taking? Authorizing Provider  albuterol (PROVENTIL HFA;VENTOLIN HFA) 108 (90 Base) MCG/ACT inhaler Inhale into the lungs every 6 (six) hours as needed for wheezing or shortness of breath.    [provider]  allopurinol (ZYLOPRIM) 100 MG tablet Take 100 mg by mouth daily.    [provider]  amLODipine (NORVASC) 10 MG tablet Take 10 mg by mouth daily.    [provider]  aspirin 325 MG tablet Take 325 mg by mouth daily.    [provider]  clotrimazole (LOTRIMIN) 1 % cream Apply to affected area 2 times daily 01/27/18   Khatri, Hina, PA-C  diclofenac sodium (VOLTAREN) 1 % GEL Apply 2 g topically 4 (four) times daily. 04/12/18   Leandrew Koyanagi, MD  esomeprazole (NEXIUM) 40 MG capsule Take 40 mg by mouth 2 (two) times daily.    [provider]  HYDROcodone-acetaminophen (NORCO) 5-325 MG tablet Take 1-2 tablets by mouth every 6 (six) hours as needed. Patient not taking: Reported on 07/16/2017 05/20/17   Leandrew Koyanagi, MD  ipratropium-albuterol (DUONEB) 0.5-2.5 (3) MG/3ML SOLN Take 3 mLs by nebulization every 8 (eight) hours as needed. 11/22/18   Tacy Learn, PA-C  losartan (COZAAR) 100 MG tablet Take 100 mg by mouth daily.    [provider]  metoprolol (LOPRESSOR) 100 MG tablet Take 100 mg by mouth 2 (two) times daily.    [provider]  Milk Thistle 500 MG CAPS Take 1,000 mg by mouth daily.     [provider]  Multiple Vitamin (MULITIVITAMIN WITH MINERALS) TABS Take 1 tablet by mouth daily.    [provider]  naproxen (NAPROSYN) 500 MG tablet Take 1 tablet (500 mg total) by mouth 2 (two) times daily. 01/27/18   Khatri, Hina, PA-C  niacin (NIASPAN) 1000 MG CR tablet Take 1,000 mg by mouth at bedtime.    [provider]  nitroGLYCERIN (NITROSTAT) 0.4 MG SL tablet Place 0.4 mg under the tongue every 5 (five) minutes as needed. For chest pain    [provider]  Omega-3 Fatty Acids (FISH OIL) 1200 MG CPDR Take 1,200 mg by mouth daily.    [provider]  ondansetron (ZOFRAN) 4 MG tablet Take 1-2 tablets (4-8 mg total) by mouth every 8 (eight) hours as needed for nausea or vomiting. Patient  not taking: Reported on 07/16/2017 05/20/17   Leandrew Koyanagi, MD  predniSONE (DELTASONE) 10 MG tablet Take 4 tablets (40 mg total) by mouth daily for 4 days. 11/22/18 11/26/18  Tacy Learn, PA-C  promethazine (PHENERGAN) 25 MG tablet Take 1 tablet (25 mg total) by mouth every 6 (six) hours as needed for nausea. Patient not taking: Reported on 07/16/2017 05/20/17   Leandrew Koyanagi, MD  senna-docusate (SENOKOT S) 8.6-50 MG tablet Take 1 tablet by mouth at bedtime as needed. Patient not taking: Reported on 07/16/2017 05/20/17   Leandrew Koyanagi, MD  traMADol (ULTRAM) 50 MG tablet Take 1 tablet (50 mg total) by mouth every 6 (six) hours as needed. Patient not taking: Reported on 07/16/2017 06/02/17   Leandrew Koyanagi, MD  traZODone (DESYREL) 50 MG tablet Take 50 mg by mouth at bedtime.  [provider]    Family History Family History  Problem Relation Age of Onset  . Liver disease Mother   . Liver cancer Mother   . Breast cancer Mother   . Cancer Mother   . Hypertension Mother   . Heart attack Mother   . Cancer Father   . Heart disease Father        before age 38  . Hyperlipidemia Father   . Hypertension Father   . Heart attack Father   . Heart disease Unknown        multiple family members on maternal and paternal side of family  . Diabetes Sister   . Hyperlipidemia Sister   . Hypertension Sister   . Diabetes Paternal Grandmother   . Hypertension Brother   . Colon cancer Neg Hx     Social History Social History   Tobacco Use  . Smoking status: Current Every Day Smoker    Packs/day: 1.00    Years: 4.00    Pack years: 4.00    Types: Cigarettes  . Smokeless tobacco: Never Used  Substance Use Topics  . Alcohol use: No    Alcohol/week: 0.0 standard drinks    Comment: no alcohol in 42yr   . Drug use: No     Allergies   Penicillins   Review of Systems Review of Systems  Constitutional: Negative for chills and fever.  HENT: Positive for congestion. Negative for sinus  pressure, sinus pain, sneezing and sore throat.   Respiratory: Positive for cough, chest tightness, shortness of breath and wheezing.   Cardiovascular: Negative for chest pain and leg swelling.  Gastrointestinal: Negative for abdominal pain, nausea and vomiting.  Musculoskeletal: Negative for arthralgias and myalgias.  Skin: Negative for rash and wound.  Allergic/Immunologic: Negative for immunocompromised state.  Neurological: Negative for weakness.  Psychiatric/Behavioral: Negative for confusion.  All other systems reviewed and are negative.    Physical Exam Updated Vital Signs BP 130/73   Pulse 83   Temp 99.3 F (37.4 C) (Oral) Comment: Simultaneous filing. User may not have seen previous data. Comment (Src): Simultaneous filing. User may not have seen previous data.  Resp (!) 25   Ht 5' 8"  (1.727 m)   Wt 99.8 kg   SpO2 95%   BMI 33.45 kg/m   Physical Exam  Constitutional: He is oriented to person, place, and time. He appears well-developed and well-nourished. No distress.  HENT:  Head: Normocephalic and atraumatic.  Mouth/Throat: Oropharynx is clear and moist. No oropharyngeal exudate or posterior oropharyngeal edema.  Neck: No JVD present.  Cardiovascular: Normal rate, regular rhythm and intact distal pulses.  No murmur heard. Pulmonary/Chest: Effort normal. Tachypnea noted. He has decreased breath sounds. He has wheezes.  Abdominal: Soft. There is no tenderness.  Musculoskeletal:       Right lower leg: Normal. He exhibits no tenderness and no edema.       Left lower leg: Normal. He exhibits no tenderness and no edema.  Neurological: He is alert and oriented to person, place, and time.  Skin: Skin is warm and dry. He is not diaphoretic.  Psychiatric: He has a normal mood and affect. His behavior is normal.  Nursing note and vitals reviewed.    ED Treatments / Results  Labs (all labs ordered are listed, but only abnormal results are displayed) Labs Reviewed    BASIC METABOLIC PANEL - Abnormal; Notable for the following components:      Result Value   Glucose, Bld 114 (*)  All other components within normal limits  CBC - Abnormal; Notable for the following components:   WBC 10.8 (*)    All other components within normal limits  I-STAT TROPONIN, ED    EKG None  Radiology Dg Chest 2 View  Result Date: 11/22/2018 CLINICAL DATA:  Shortness of breath, cold symptoms EXAM: CHEST - 2 VIEW COMPARISON:  10/29/2017 FINDINGS: Minimal enlargement of cardiac silhouette. Mediastinal contours and pulmonary vascularity normal. Peribronchial thickening and slight accentuation of perihilar markings likely reflect bronchitis. No acute infiltrate, pleural effusion or pneumothorax. Bones unremarkable. IMPRESSION: Bronchitic changes without infiltrate. Minimal enlargement of cardiac silhouette. Electronically Signed   By: Lavonia Dana M.D.   On: 11/22/2018 09:52    Procedures Procedures (including critical care time)  Medications Ordered in ED Medications  ipratropium-albuterol (DUONEB) 0.5-2.5 (3) MG/3ML nebulizer solution 3 mL (3 mLs Nebulization Given 11/22/18 1006)  predniSONE (DELTASONE) tablet 60 mg (60 mg Oral Given 11/22/18 1006)  ipratropium-albuterol (DUONEB) 0.5-2.5 (3) MG/3ML nebulizer solution 3 mL (3 mLs Nebulization Given 11/22/18 1044)     Initial Impression / Assessment and Plan / ED Course  I have reviewed the triage vital signs and the nursing notes.  Pertinent labs & imaging results that were available during my care of the patient were reviewed by me and considered in my medical decision making (see chart for details).  Clinical Course as of Nov 23 1135  Mon Nov 22, 7156  3637 57 year old male with history of asthma and COPD presents with complaint of chest tightness, nonproductive cough, wheezing.  She does a daily smoker, not on daily medications for COPD, uses an albuterol inhaler as needed and states he gets bronchitis this time  every year. patient is able to speak in complete sentences, mildly tachypnic on initial exam.  After duo neb and steroids, patient states he is feeling better and ready for dc home. Room air sat 96%, respirations even and unlabored, lungs with ongoing wheezing. Patient agreeable to 1 more neb treatment.    [LM]  1135 Patient reports significant improvement after second neb treatment.  Patient feels ready for discharge, lung sounds have improved. Prescription given for nebulizer with DuoNeb as well as prednisone for 4 more days.  Advised patient to call his doctor's office today to arrange follow-up.  Return to ER for fevers or worsening symptoms.   [LM]    Clinical Course User Index [LM] Tacy Learn, PA-C   Final Clinical Impressions(s) / ED Diagnoses   Final diagnoses:  COPD exacerbation [facility])    ED Discharge Orders         Ordered    DME Nebulizer machine     11/22/18 1134    ipratropium-albuterol (DUONEB) 0.5-2.5 (3) MG/3ML SOLN  Every 8 hours PRN     11/22/18 1134    predniSONE (DELTASONE) 10 MG tablet  Daily     11/22/18 1134           Roque Lias 11/22/18 1136    Julianne Rice, MD 11/23/18 [phone]

## 2018-11-22 NOTE — ED Notes (Signed)
Patient verbalizes understanding of discharge instructions. Opportunity for questioning and answers were provided. Armband removed by staff, pt discharged from ED ambulatory.   

## 2018-11-22 NOTE — ED Triage Notes (Signed)
C/o short of breath and cold symptoms since Wednesday evening. Has been using OTC meds without relief- started getting "dizzy headed" Wednesday also. Dry hacky cough. Fever subjectively.

## 2018-11-22 NOTE — Discharge Instructions (Addendum)
Take prednisone as prescribed and complete the full course. Prescription sent for DuoNeb treatment to be used with nebulizer as prescribed. Call your doctor's office today to arrange follow-up in the next week. Return to ER for any worsening breathing symptoms, fevers, any worsening symptoms or concerns.

## 2018-11-23 ENCOUNTER — Encounter (HOSPITAL_COMMUNITY): Payer: Self-pay | Admitting: Emergency Medicine

## 2018-11-23 ENCOUNTER — Inpatient Hospital Stay (HOSPITAL_COMMUNITY)
Admission: EM | Admit: 2018-11-23 | Discharge: 2018-11-29 | DRG: 871 | Disposition: A | Discharge status: Home / Self Care | Payer: Medicare Other | Attending: Family Medicine | Admitting: Family Medicine

## 2018-11-23 ENCOUNTER — Emergency Department (HOSPITAL_COMMUNITY): Payer: Medicare Other

## 2018-11-23 ENCOUNTER — Other Ambulatory Visit: Payer: Self-pay

## 2018-11-23 DIAGNOSIS — I959 Hypotension, unspecified: Secondary | ICD-10-CM | POA: Diagnosis present

## 2018-11-23 DIAGNOSIS — R062 Wheezing: Secondary | ICD-10-CM | POA: Diagnosis not present

## 2018-11-23 DIAGNOSIS — Z8673 Personal history of transient ischemic attack (TIA), and cerebral infarction without residual deficits: Secondary | ICD-10-CM

## 2018-11-23 DIAGNOSIS — E876 Hypokalemia: Secondary | ICD-10-CM | POA: Diagnosis not present

## 2018-11-23 DIAGNOSIS — M109 Gout, unspecified: Secondary | ICD-10-CM | POA: Diagnosis present

## 2018-11-23 DIAGNOSIS — F4310 Post-traumatic stress disorder, unspecified: Secondary | ICD-10-CM | POA: Diagnosis present

## 2018-11-23 DIAGNOSIS — J440 Chronic obstructive pulmonary disease with acute lower respiratory infection: Secondary | ICD-10-CM | POA: Diagnosis present

## 2018-11-23 DIAGNOSIS — J181 Lobar pneumonia, unspecified organism: Secondary | ICD-10-CM | POA: Diagnosis present

## 2018-11-23 DIAGNOSIS — I2510 Atherosclerotic heart disease of native coronary artery without angina pectoris: Secondary | ICD-10-CM | POA: Diagnosis present

## 2018-11-23 DIAGNOSIS — A419 Sepsis, unspecified organism: Secondary | ICD-10-CM | POA: Diagnosis not present

## 2018-11-23 DIAGNOSIS — R000 Tachycardia, unspecified: Secondary | ICD-10-CM | POA: Diagnosis not present

## 2018-11-23 DIAGNOSIS — I252 Old myocardial infarction: Secondary | ICD-10-CM

## 2018-11-23 DIAGNOSIS — F17210 Nicotine dependence, cigarettes, uncomplicated: Secondary | ICD-10-CM | POA: Diagnosis not present

## 2018-11-23 DIAGNOSIS — Z79899 Other long term (current) drug therapy: Secondary | ICD-10-CM

## 2018-11-23 DIAGNOSIS — K7581 Nonalcoholic steatohepatitis (NASH): Secondary | ICD-10-CM | POA: Diagnosis present

## 2018-11-23 DIAGNOSIS — J449 Chronic obstructive pulmonary disease, unspecified: Secondary | ICD-10-CM | POA: Diagnosis present

## 2018-11-23 DIAGNOSIS — K219 Gastro-esophageal reflux disease without esophagitis: Secondary | ICD-10-CM | POA: Diagnosis present

## 2018-11-23 DIAGNOSIS — I739 Peripheral vascular disease, unspecified: Secondary | ICD-10-CM | POA: Diagnosis present

## 2018-11-23 DIAGNOSIS — J441 Chronic obstructive pulmonary disease with (acute) exacerbation: Secondary | ICD-10-CM | POA: Diagnosis present

## 2018-11-23 DIAGNOSIS — T380X5A Adverse effect of glucocorticoids and synthetic analogues, initial encounter: Secondary | ICD-10-CM | POA: Diagnosis present

## 2018-11-23 DIAGNOSIS — I10 Essential (primary) hypertension: Secondary | ICD-10-CM | POA: Diagnosis present

## 2018-11-23 DIAGNOSIS — Z7982 Long term (current) use of aspirin: Secondary | ICD-10-CM

## 2018-11-23 DIAGNOSIS — R0602 Shortness of breath: Secondary | ICD-10-CM | POA: Diagnosis not present

## 2018-11-23 DIAGNOSIS — R069 Unspecified abnormalities of breathing: Secondary | ICD-10-CM | POA: Diagnosis not present

## 2018-11-23 DIAGNOSIS — R0902 Hypoxemia: Secondary | ICD-10-CM | POA: Diagnosis not present

## 2018-11-23 DIAGNOSIS — J189 Pneumonia, unspecified organism: Secondary | ICD-10-CM | POA: Diagnosis not present

## 2018-11-23 DIAGNOSIS — J9601 Acute respiratory failure with hypoxia: Secondary | ICD-10-CM

## 2018-11-23 DIAGNOSIS — E785 Hyperlipidemia, unspecified: Secondary | ICD-10-CM | POA: Diagnosis present

## 2018-11-23 DIAGNOSIS — R6520 Severe sepsis without septic shock: Secondary | ICD-10-CM | POA: Diagnosis present

## 2018-11-23 DIAGNOSIS — R739 Hyperglycemia, unspecified: Secondary | ICD-10-CM | POA: Diagnosis present

## 2018-11-23 LAB — CBC WITH DIFFERENTIAL/PLATELET
Abs Immature Granulocytes: 0.08 10*3/uL — ABNORMAL HIGH (ref 0.00–0.07)
BASOS ABS: 0 10*3/uL (ref 0.0–0.1)
Basophils Relative: 0 %
EOS ABS: 0.1 10*3/uL (ref 0.0–0.5)
EOS PCT: 1 %
HCT: 45.3 % (ref 39.0–52.0)
Hemoglobin: 14.9 g/dL (ref 13.0–17.0)
Immature Granulocytes: 1 %
Lymphocytes Relative: 11 %
Lymphs Abs: 1.3 10*3/uL (ref 0.7–4.0)
MCH: 29.1 pg (ref 26.0–34.0)
MCHC: 32.9 g/dL (ref 30.0–36.0)
MCV: 88.5 fL (ref 80.0–100.0)
Monocytes Absolute: 1.1 10*3/uL — ABNORMAL HIGH (ref 0.1–1.0)
Monocytes Relative: 9 %
Neutro Abs: 9.2 10*3/uL — ABNORMAL HIGH (ref 1.7–7.7)
Neutrophils Relative %: 78 %
PLATELETS: 126 10*3/uL — AB (ref 150–400)
RBC: 5.12 MIL/uL (ref 4.22–5.81)
RDW: 12.1 % (ref 11.5–15.5)
WBC: 11.8 10*3/uL — AB (ref 4.0–10.5)
nRBC: 0 % (ref 0.0–0.2)

## 2018-11-23 LAB — BASIC METABOLIC PANEL
Anion gap: 11 (ref 5–15)
BUN: 24 mg/dL — AB (ref 6–20)
CHLORIDE: 101 mmol/L (ref 98–111)
CO2: 22 mmol/L (ref 22–32)
CREATININE: 0.96 mg/dL (ref 0.61–1.24)
Calcium: 9.1 mg/dL (ref 8.9–10.3)
GFR calc Af Amer: >60 mL/min (ref >60)
Glucose, Bld: 111 mg/dL — ABNORMAL HIGH (ref 70–99)
Potassium: 3.3 mmol/L — ABNORMAL LOW (ref 3.5–5.1)
SODIUM: 134 mmol/L — AB (ref 135–145)

## 2018-11-23 LAB — LACTIC ACID, PLASMA: LACTIC ACID, VENOUS: 6.3 mmol/L — AB (ref 0.5–1.9)

## 2018-11-23 LAB — TROPONIN I
TROPONIN I: 0.04 ng/mL — AB (ref <0.03)
Troponin I: <0.03 ng/mL (ref <0.03)

## 2018-11-23 LAB — I-STAT CG4 LACTIC ACID, ED: LACTIC ACID, VENOUS: 1.79 mmol/L (ref 0.5–1.9)

## 2018-11-23 LAB — INFLUENZA PANEL BY PCR (TYPE A & B)
INFLAPCR: NEGATIVE
INFLBPCR: NEGATIVE

## 2018-11-23 LAB — MAGNESIUM: Magnesium: 1.4 mg/dL — ABNORMAL LOW (ref 1.7–2.4)

## 2018-11-23 MED ORDER — MAGNESIUM SULFATE 2 GM/50ML IV SOLN
2.0000 g | Freq: Once | INTRAVENOUS | Status: AC
  Administered 2018-11-23: 2 g via INTRAVENOUS
  Filled 2018-11-23: qty 50

## 2018-11-23 MED ORDER — ONDANSETRON HCL 4 MG PO TABS: 4.0000 mg | ORAL_TABLET | Freq: Four times a day (QID) | ORAL | Status: DC | PRN

## 2018-11-23 MED ORDER — METHYLPREDNISOLONE SODIUM SUCC 125 MG IJ SOLR
60.0000 mg | Freq: Two times a day (BID) | INTRAMUSCULAR | Status: DC
  Administered 2018-11-23 – 2018-11-24 (×2): 60 mg via INTRAVENOUS
  Filled 2018-11-23 (×2): qty 2

## 2018-11-23 MED ORDER — DM-GUAIFENESIN ER 30-600 MG PO TB12
1.0000 | ORAL_TABLET | Freq: Two times a day (BID) | ORAL | Status: DC
  Administered 2018-11-23 – 2018-11-29 (×11): 1 via ORAL
  Filled 2018-11-23 (×11): qty 1

## 2018-11-23 MED ORDER — SODIUM CHLORIDE 0.9 % IV BOLUS
500.0000 mL | Freq: Once | INTRAVENOUS | Status: AC
  Administered 2018-11-23: 500 mL via INTRAVENOUS

## 2018-11-23 MED ORDER — SODIUM CHLORIDE 0.9 % IV BOLUS
1000.0000 mL | Freq: Once | INTRAVENOUS | Status: AC
  Administered 2018-11-23: 1000 mL via INTRAVENOUS

## 2018-11-23 MED ORDER — LEVOFLOXACIN IN D5W 750 MG/150ML IV SOLN
750.0000 mg | INTRAVENOUS | Status: DC
  Administered 2018-11-24: 750 mg via INTRAVENOUS
  Filled 2018-11-23 (×2): qty 150

## 2018-11-23 MED ORDER — ASPIRIN 325 MG PO TABS
325.0000 mg | ORAL_TABLET | Freq: Every morning | ORAL | Status: DC
  Administered 2018-11-24 – 2018-11-29 (×6): 325 mg via ORAL
  Filled 2018-11-23 (×6): qty 1

## 2018-11-23 MED ORDER — ACETAMINOPHEN 325 MG PO TABS: 650.0000 mg | ORAL_TABLET | Freq: Four times a day (QID) | ORAL | Status: DC | PRN

## 2018-11-23 MED ORDER — ACETAMINOPHEN 650 MG RE SUPP: 650.0000 mg | Freq: Four times a day (QID) | RECTAL | Status: DC | PRN

## 2018-11-23 MED ORDER — POTASSIUM CHLORIDE CRYS ER 20 MEQ PO TBCR
40.0000 meq | EXTENDED_RELEASE_TABLET | Freq: Once | ORAL | Status: AC
  Administered 2018-11-23: 40 meq via ORAL
  Filled 2018-11-23: qty 2

## 2018-11-23 MED ORDER — ENOXAPARIN SODIUM 40 MG/0.4ML ~~LOC~~ SOLN
40.0000 mg | SUBCUTANEOUS | Status: DC
  Administered 2018-11-23 – 2018-11-28 (×6): 40 mg via SUBCUTANEOUS
  Filled 2018-11-23 (×6): qty 0.4

## 2018-11-23 MED ORDER — IPRATROPIUM-ALBUTEROL 0.5-2.5 (3) MG/3ML IN SOLN
3.0000 mL | RESPIRATORY_TRACT | Status: DC | PRN
  Administered 2018-11-25 (×3): 3 mL via RESPIRATORY_TRACT
  Filled 2018-11-23 (×3): qty 3

## 2018-11-23 MED ORDER — TRAZODONE HCL 50 MG PO TABS
100.0000 mg | ORAL_TABLET | Freq: Every day | ORAL | Status: DC
  Administered 2018-11-23 – 2018-11-28 (×6): 100 mg via ORAL
  Filled 2018-11-23 (×6): qty 2

## 2018-11-23 MED ORDER — PANTOPRAZOLE SODIUM 40 MG PO TBEC
40.0000 mg | DELAYED_RELEASE_TABLET | Freq: Every day | ORAL | Status: DC
  Administered 2018-11-24 – 2018-11-25 (×2): 40 mg via ORAL
  Filled 2018-11-23 (×2): qty 1

## 2018-11-23 MED ORDER — IPRATROPIUM-ALBUTEROL 0.5-2.5 (3) MG/3ML IN SOLN
3.0000 mL | Freq: Four times a day (QID) | RESPIRATORY_TRACT | Status: DC
  Administered 2018-11-23 – 2018-11-25 (×9): 3 mL via RESPIRATORY_TRACT
  Filled 2018-11-23 (×9): qty 3

## 2018-11-23 MED ORDER — POLYETHYLENE GLYCOL 3350 17 G PO PACK: 17.0000 g | PACK | Freq: Every day | ORAL | Status: DC | PRN

## 2018-11-23 MED ORDER — NICOTINE 14 MG/24HR TD PT24
14.0000 mg | MEDICATED_PATCH | Freq: Every day | TRANSDERMAL | Status: DC
  Administered 2018-11-23 – 2018-11-29 (×7): 14 mg via TRANSDERMAL
  Filled 2018-11-23 (×7): qty 1

## 2018-11-23 MED ORDER — ALLOPURINOL 100 MG PO TABS
100.0000 mg | ORAL_TABLET | Freq: Every morning | ORAL | Status: DC
  Administered 2018-11-24 – 2018-11-29 (×6): 100 mg via ORAL
  Filled 2018-11-23 (×6): qty 1

## 2018-11-23 MED ORDER — ACETAMINOPHEN 500 MG PO TABS
1000.0000 mg | ORAL_TABLET | Freq: Once | ORAL | Status: AC
  Administered 2018-11-23: 1000 mg via ORAL
  Filled 2018-11-23: qty 2

## 2018-11-23 MED ORDER — ALBUTEROL SULFATE (2.5 MG/3ML) 0.083% IN NEBU: 5.0000 mg | INHALATION_SOLUTION | Freq: Once | RESPIRATORY_TRACT | Status: DC

## 2018-11-23 MED ORDER — INFLUENZA VAC SPLIT QUAD 0.5 ML IM SUSY
0.5000 mL | PREFILLED_SYRINGE | INTRAMUSCULAR | Status: DC
  Filled 2018-11-23: qty 0.5

## 2018-11-23 MED ORDER — ONDANSETRON HCL 4 MG/2ML IJ SOLN: 4.0000 mg | Freq: Four times a day (QID) | INTRAMUSCULAR | Status: DC | PRN

## 2018-11-23 MED ORDER — POTASSIUM CHLORIDE IN NACL 20-0.9 MEQ/L-% IV SOLN: INTRAVENOUS | Status: DC

## 2018-11-23 MED ORDER — ALBUTEROL (5 MG/ML) CONTINUOUS INHALATION SOLN
10.0000 mg/h | INHALATION_SOLUTION | Freq: Once | RESPIRATORY_TRACT | Status: AC
  Administered 2018-11-23: 10 mg/h via RESPIRATORY_TRACT
  Filled 2018-11-23: qty 20

## 2018-11-23 MED ORDER — LEVOFLOXACIN IN D5W 750 MG/150ML IV SOLN
750.0000 mg | Freq: Once | INTRAVENOUS | Status: AC
  Administered 2018-11-23: 750 mg via INTRAVENOUS
  Filled 2018-11-23: qty 150

## 2018-11-23 MED ORDER — POTASSIUM CHLORIDE IN NACL 40-0.9 MEQ/L-% IV SOLN
INTRAVENOUS | Status: AC
  Administered 2018-11-23 – 2018-11-24 (×3): 125 mL/h via INTRAVENOUS

## 2018-11-23 MED ORDER — POTASSIUM CHLORIDE CRYS ER 20 MEQ PO TBCR: 40.0000 meq | EXTENDED_RELEASE_TABLET | Freq: Once | ORAL | Status: DC

## 2018-11-23 MED ORDER — SODIUM CHLORIDE 0.9 % IV SOLN
INTRAVENOUS | Status: DC | PRN
  Administered 2018-11-23: 250 mL via INTRAVENOUS
  Administered 2018-11-23: 100 mL via INTRAVENOUS

## 2018-11-23 NOTE — ED Provider Notes (Signed)
[facility] EMERGENCY DEPARTMENT Provider Note   CSN: [id number] Arrival date & time: 11/23/18  1325     History   Chief Complaint Chief Complaint  Patient presents with  . Shortness of Breath    HPI [name] [name] is a 57 y.o. male.  HPI Complains of productive cough,, brownish sputum with flecks of blood, right-sided chest pain, subjective fever and shortness of breath onset 5 days ago.  Seen yesterday at [facility] treated with steroids and nebulized treatment with temporary improvement of symptoms.  No nausea or vomiting.  No other associated symptoms.  Brought by EMS today treated with albuterol nebulized treatment by EMS with some improvement of breathing.  Associated symptoms include right-sided chest pain, worse with coughing improved with remaining still.  Patient treated himself with prednisone this morning.  No other associated symptoms he did not get a flu shot this year. Past Medical History:  Diagnosis Date  . Adenomatous colon polyp 2009  . Alcohol abuse    abstinent since 1992  . Arthritis    back  . Asthma   . Barrett's esophagus 05/22/2015  . Chronic back pain   . COPD (chronic obstructive pulmonary disease) ([place])   . Coronary artery disease    70-80% ostial LAD '10  . GERD (gastroesophageal reflux disease)    takes Nexium daily  . Gout    takes ALlopurinol daily  . Hemorrhoids   . History of bronchitis 2015  . History of kidney stones   . Hyperlipidemia    was given a script a month ago but scared to take it.Medical Md is aware  . Hypertension    takes Losartan,Metoprolol,and Amlodipine daily  . Joint pain   . Lymphocytic colitis 2009  . Myocardial infarction [facility]) 2010   "light" (admit for CP 11/2009; ruled out for MI but had 70-80% ostail LAD stenosis and treated medically following NL perfusion study)  . NASH (nonalcoholic steatohepatitis)   . Peripheral vascular disease ([place])   . Personal history of colonic polyps 07/20/2002   Diminutive adenomas (3) 06/2002 diminutive adenoma (1) 2011   . Pre-diabetes   . Stroke ([place])   . TIA (transient ischemic attack)   . Urinary urgency     Patient Active Problem List   Diagnosis Date Noted  . Chronic pain of left knee 03/01/2018  . 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 Chronic cholecystitis 08/03/2015  . Barrett's esophagus 05/22/2015  . Abnormal findings on radiological examination of gastrointestinal tract 05/02/2015  . RUQ abdominal pain 05/02/2015  . Steatosis 05/02/2015  . Chest pain 10/11/2013  . History of CVA (cerebrovascular accident) 10/11/2013  . CVA (cerebral infarction) 05/26/2012  . CAD (coronary artery disease) 05/26/2012  . HEMORRHOIDS-INTERNAL 03/12/2010  . GOUT 08/25/2007  . HYPERTENSION 08/25/2007  . GERD 08/25/2007  . History of adenomatous polyp of colon 07/20/2002    Past Surgical History:  Procedure Laterality Date  . ANEURYSM COILING  12/2010   cerebral  . BAND HEMORRHOIDECTOMY  2003   at sigmoidoscopy  . CARDIAC CATHETERIZATION  2010  . CHOLECYSTECTOMY  08/03/2015  . CHOLECYSTECTOMY N/A 08/03/2015   Procedure: LAPAROSCOPIC CHOLECYSTECTOMY WITH INTRAOPERATIVE CHOLANGIOGRAM;  Surgeon: Fanny Skates, MD;  Location: [place];  Service: General;  Laterality: N/A;  . COLONOSCOPY  multiple  . ESOPHAGOGASTRODUODENOSCOPY    . KNEE ARTHROSCOPY WITH MEDIAL MENISECTOMY Left 05/20/2017   Procedure: LEFT KNEE ARTHROSCOPY WITH PARTIAL MEDIAL MENISCECTOMY;  Surgeon: Leandrew Koyanagi, MD;  Location:  [place];  Service: Orthopedics;  Laterality: Left;  . LUMBAR SPINE SURGERY     x 2  . NASAL SINUS SURGERY    . TEE WITHOUT CARDIOVERSION  05/28/2012   Procedure: TRANSESOPHAGEAL ECHOCARDIOGRAM (TEE);  Surgeon: Lelon Perla, MD;  Location[facility] ENDOSCOPY;  Service: Cardiovascular;  Laterality: N/A;        Home Medications    Prior to Admission medications   Medication Sig Start Date End Date Taking?  Authorizing Provider  albuterol (PROVENTIL HFA;VENTOLIN HFA) 108 (90 Base) MCG/ACT inhaler Inhale into the lungs every 6 (six) hours as needed for wheezing or shortness of breath.    [provider]  allopurinol (ZYLOPRIM) 100 MG tablet Take 100 mg by mouth daily.    [provider]  amLODipine (NORVASC) 10 MG tablet Take 10 mg by mouth daily.    [provider]  aspirin 325 MG tablet Take 325 mg by mouth daily.    [provider]  clotrimazole (LOTRIMIN) 1 % cream Apply to affected area 2 times daily 01/27/18   Khatri, Hina, PA-C  diclofenac sodium (VOLTAREN) 1 % GEL Apply 2 g topically 4 (four) times daily. 04/12/18   Leandrew Koyanagi, MD  esomeprazole (NEXIUM) 40 MG capsule Take 40 mg by mouth 2 (two) times daily.    [provider]  HYDROcodone-acetaminophen (NORCO) 5-325 MG tablet Take 1-2 tablets by mouth every 6 (six) hours as needed. Patient not taking: Reported on 07/16/2017 05/20/17   Leandrew Koyanagi, MD  ipratropium-albuterol (DUONEB) 0.5-2.5 (3) MG/3ML SOLN Take 3 mLs by nebulization every 8 (eight) hours as needed. 11/22/18   Tacy Learn, PA-C  losartan (COZAAR) 100 MG tablet Take 100 mg by mouth daily.    [provider]  metoprolol (LOPRESSOR) 100 MG tablet Take 100 mg by mouth 2 (two) times daily.    [provider]  Milk Thistle 500 MG CAPS Take 1,000 mg by mouth daily.     [provider]  Multiple Vitamin (MULITIVITAMIN WITH MINERALS) TABS Take 1 tablet by mouth daily.    [provider]  naproxen (NAPROSYN) 500 MG tablet Take 1 tablet (500 mg total) by mouth 2 (two) times daily. 01/27/18   Khatri, Hina, PA-C  niacin (NIASPAN) 1000 MG CR tablet Take 1,000 mg by mouth at bedtime.    [provider]  nitroGLYCERIN (NITROSTAT) 0.4 MG SL tablet Place 0.4 mg under the tongue every 5 (five) minutes as needed. For chest pain    [provider]  Omega-3 Fatty Acids (FISH OIL) 1200 MG CPDR Take  1,200 mg by mouth daily.    [provider]  ondansetron (ZOFRAN) 4 MG tablet Take 1-2 tablets (4-8 mg total) by mouth every 8 (eight) hours as needed for nausea or vomiting. Patient not taking: Reported on 07/16/2017 05/20/17   Leandrew Koyanagi, MD  predniSONE (DELTASONE) 10 MG tablet Take 4 tablets (40 mg total) by mouth daily for 4 days. 11/22/18 11/26/18  Tacy Learn, PA-C  promethazine (PHENERGAN) 25 MG tablet Take 1 tablet (25 mg total) by mouth every 6 (six) hours as needed for nausea. Patient not taking: Reported on 07/16/2017 05/20/17   Leandrew Koyanagi, MD  senna-docusate (SENOKOT S) 8.6-50 MG tablet Take 1 tablet by mouth at bedtime as needed. Patient not taking: Reported on 07/16/2017 05/20/17   Leandrew Koyanagi, MD  traMADol (ULTRAM) 50 MG tablet Take 1 tablet (50 mg total) by mouth every 6 (  six) hours as needed. Patient not taking: Reported on 07/16/2017 06/02/17   Leandrew Koyanagi, MD  traZODone (DESYREL) 50 MG tablet Take 50 mg by mouth at bedtime.    [provider]    Family History Family History  Problem Relation Age of Onset  . Liver disease Mother   . Liver cancer Mother   . Breast cancer Mother   . Cancer Mother   . Hypertension Mother   . Heart attack Mother   . Cancer Father   . Heart disease Father        before age 75  . Hyperlipidemia Father   . Hypertension Father   . Heart attack Father   . Heart disease Unknown        multiple family members on maternal and paternal side of family  . Diabetes Sister   . Hyperlipidemia Sister   . Hypertension Sister   . Diabetes Paternal Grandmother   . Hypertension Brother   . Colon cancer Neg Hx     Social History Social History   Tobacco Use  . Smoking status: Current Every Day Smoker    Packs/day: 1.00    Years: 4.00    Pack years: 4.00    Types: Cigarettes  . Smokeless tobacco: Never Used  Substance Use Topics  . Alcohol use: No    Alcohol/week: 0.0 standard drinks    Comment: no alcohol in 67yr    . Drug use: No     Allergies   Penicillins   Review of Systems Review of Systems  Constitutional: Positive for fever.  Respiratory: Positive for cough and shortness of breath.   Cardiovascular: Positive for chest pain.  All other systems reviewed and are negative.    Physical Exam Updated Vital Signs BP (!) 104/59 (BP Location: Right Arm)   Pulse (!) 114   Temp 99.7 F (37.6 C) (Oral)   Resp (!) 23   Ht 5' 8"  (1.727 m)   Wt 99.8 kg   SpO2 94%   BMI 33.45 kg/m   Physical Exam  Constitutional: He appears well-developed and well-nourished. No distress.  HENT:  Head: Normocephalic and atraumatic.  Eyes: Pupils are equal, round, and reactive to light. Conjunctivae are normal.  Neck: Neck supple. No tracheal deviation present. No thyromegaly present.  Cardiovascular: Regular rhythm.  No murmur heard. Tachycardic  Pulmonary/Chest: Effort normal. He has wheezes.  Diffuse end expiratory wheezes and diffuse rhonchi.  No respiratory distress  Abdominal: Soft. Bowel sounds are normal. He exhibits no distension. There is no tenderness.  Musculoskeletal: Normal range of motion. He exhibits no edema or tenderness.  Neurological: He is alert. Coordination normal.  Skin: Skin is warm and dry. No rash noted.  Psychiatric: He has a normal mood and affect.  Nursing note and vitals reviewed.    ED Treatments / Results  Labs (all labs ordered are listed, but only abnormal results are displayed) Labs Reviewed  CULTURE, BLOOD (ROUTINE X 2)  CULTURE, BLOOD (ROUTINE X 2)  INFLUENZA PANEL BY PCR (TYPE A & B)  CBC WITH DIFFERENTIAL/PLATELET  BASIC METABOLIC PANEL    EKG EKG Interpretation  Date/Time:  Tuesday November 23 2018 13:35:24 EST Ventricular Rate:  113 PR Interval:    QRS Duration: 116 QT Interval:  327 QTC Calculation: 449 R Axis:   62 Text Interpretation:  Sinus tachycardia Consider left atrial enlargement Right bundle branch block No significant change since  last tracing Confirmed by JOrlie [phone] on 11/23/2018 2:03:31 PM  Radiology Dg Chest 2 View  Result Date: 11/22/2018 CLINICAL DATA:  Shortness of breath, cold symptoms EXAM: CHEST - 2 VIEW COMPARISON:  10/29/2017 FINDINGS: Minimal enlargement of cardiac silhouette. Mediastinal contours and pulmonary vascularity normal. Peribronchial thickening and slight accentuation of perihilar markings likely reflect bronchitis. No acute infiltrate, pleural effusion or pneumothorax. Bones unremarkable. IMPRESSION: Bronchitic changes without infiltrate. Minimal enlargement of cardiac silhouette. Electronically Signed   By: Lavonia Dana M.D.   On: 11/22/2018 09:52  Chest x-ray viewed by me Procedures Procedures (including critical care time)  Medications Ordered in ED Medications  albuterol (PROVENTIL) (2.5 MG/3ML) 0.083% nebulizer solution 5 mg (has no administration in time range)  albuterol (PROVENTIL,VENTOLIN) solution continuous neb (has no administration in time range)   Chest x-ray viewed by me.  Consistent with pneumonia.  Patient with productive cough, fever clinically matches pneumonia  Initial Impression / Assessment and Plan / ED Course  I have reviewed the triage vital signs and the nursing notes.  Pertinent labs & imaging results that were available during my care of the patient were reviewed by me and considered in my medical decision making (see chart for details).     Code sepsis called based on sirs criteria of elevated temperature tachycardia.  Source of infection respiratory 345 PM breathing improved after treatment with Tylenol, albuterol nebulized treatment and IV antibiotics  Sepsis - Repeat Assessment  Performed at:    345pm  Vitals     Blood pressure (!) 104/59, pulse (!) 114, temperature 99.7 F (37.6 C), temperature source Oral, resp. rate (!) 23, height 5' 8"  (1.727 m), weight 99.8 kg, SpO2 95 %.  Heart:     Tachycardic  Lungs:    CTA  Capillary  Refill:   <2 sec  Peripheral Pulse:   Radial pulse palpable  Skin:     Normal Color Results for orders placed or performed during the hospital encounter of 11/23/18  Influenza panel by PCR (type A & B)  Result Value Ref Range   Influenza A By PCR NEGATIVE NEGATIVE   Influenza B By PCR NEGATIVE NEGATIVE  CBC with Differential/Platelet  Result Value Ref Range   WBC 11.8 (H) 4.0 - 10.5 K/uL   RBC 5.12 4.22 - 5.81 MIL/uL   Hemoglobin 14.9 13.0 - 17.0 g/dL   HCT 45.3 39.0 - 52.0 %   MCV 88.5 80.0 - 100.0 fL   MCH 29.1 26.0 - 34.0 pg   MCHC 32.9 30.0 - 36.0 g/dL   RDW 12.1 11.5 - 15.5 %   Platelets 126 (L) 150 - 400 K/uL   nRBC 0.0 0.0 - 0.2 %   Neutrophils Relative % 78 %   Neutro Abs 9.2 (H) 1.7 - 7.7 K/uL   Lymphocytes Relative 11 %   Lymphs Abs 1.3 0.7 - 4.0 K/uL   Monocytes Relative 9 %   Monocytes Absolute 1.1 (H) 0.1 - 1.0 K/uL   Eosinophils Relative 1 %   Eosinophils Absolute 0.1 0.0 - 0.5 K/uL   Basophils Relative 0 %   Basophils Absolute 0.0 0.0 - 0.1 K/uL   WBC Morphology VACUOLATED NEUTROPHILS    Immature Granulocytes 1 %   Abs Immature Granulocytes 0.08 (H) 0.00 - 0.07 K/uL  Basic metabolic panel  Result Value Ref Range   Sodium 134 (L) 135 - 145 mmol/L   Potassium 3.3 (L) 3.5 - 5.1 mmol/L   Chloride 101 98 - 111 mmol/L   CO2 22 22 - 32 mmol/L   Glucose, Bld  111 (H) 70 - 99 mg/dL   BUN 24 (H) 6 - 20 mg/dL   Creatinine, Ser 0.96 0.61 - 1.24 mg/dL   Calcium 9.1 8.9 - 10.3 mg/dL   GFR calc non Af Amer >60 >60 mL/min   GFR calc Af Amer >60 >60 mL/min   Anion gap 11 5 - 15  Magnesium  Result Value Ref Range   Magnesium 1.4 (L) 1.7 - 2.4 mg/dL  I-Stat CG4 Lactic Acid, ED  Result Value Ref Range   Lactic Acid, Venous 1.79 0.5 - 1.9 mmol/L   Dg Chest 2 View  Result Date: 11/23/2018 CLINICAL DATA:  Chest congestion, shortness of breath EXAM: CHEST - 2 VIEW COMPARISON:  11/23/2015 FINDINGS: Cardiac shadow is stable. Lungs are well aerated bilaterally. New right  basilar infiltrate is noted laterally. This was not visualized on the prior exam and projects primarily in the right middle lobe. No sizable effusion is seen. No bony abnormality is noted. IMPRESSION: New right basilar pneumonia. Electronically Signed   By: Inez Catalina M.D.   On: 11/23/2018 14:19   Dg Chest 2 View  Result Date: 11/22/2018 CLINICAL DATA:  Shortness of breath, cold symptoms EXAM: CHEST - 2 VIEW COMPARISON:  10/29/2017 FINDINGS: Minimal enlargement of cardiac silhouette. Mediastinal contours and pulmonary vascularity normal. Peribronchial thickening and slight accentuation of perihilar markings likely reflect bronchitis. No acute infiltrate, pleural effusion or pneumothorax. Bones unremarkable. IMPRESSION: Bronchitic changes without infiltrate. Minimal enlargement of cardiac silhouette. Electronically Signed   By: Lavonia Dana M.D.   On: 11/22/2018 09:52    I Consulted Dr.Emokpoe from hospitalist service who will arrange for overnight stay  Lab work consistent with hypokalemia and leukocytosis. Final Clinical Impressions(s) / ED Diagnoses  Diagnosis #1 community acquired pneumonia of the right middle lobe #2 sepsis #3 hypokalemia Final diagnoses:  None  #4 tobacco abuse  ED Discharge Orders    None    CRITICAL CARE Performed by: Orlie Dakin Total critical care time: 30 minutes Critical care time was exclusive of separately billable procedures and treating other patients. Critical care was necessary to treat or prevent imminent or life-threatening deterioration. Critical care was time spent personally by me on the following activities: development of treatment plan with patient and/or surrogate as well as nursing, discussions with consultants, evaluation of patient's response to treatment, examination of patient, obtaining history from patient or surrogate, ordering and performing treatments and interventions, ordering and review of laboratory studies, ordering and review of  radiographic studies, pulse oximetry and re-evaluation of patient's condition.   Orlie Dakin, MD 11/23/18 1719

## 2018-11-23 NOTE — Progress Notes (Signed)
CRITICAL VALUE ALERT  Critical Value:  Lactic Acid 6.3  Date & Time Notied:  11/23/18 1850  Provider Notified: Text-paged Dr. Denton Brick to notify.   Orders Received/Actions taken:

## 2018-11-23 NOTE — ED Notes (Signed)
ED Provider at bedside. 

## 2018-11-23 NOTE — Plan of Care (Signed)

## 2018-11-23 NOTE — H&P (Addendum)
History and Physical    [name] [name] JKK:[id number] DOB: 21-Apr-1961 DOA: 11/23/2018  PCP: Shirline Frees, MD   Patient coming from: Home  I have personally briefly reviewed patient's old medical records in [place]  Chief Complaint: SOB, Cough  HPI: [name] [name] is a 57 y.o. male with medical history significant for HTN, CVA, CAD, COPD, NASH, PVD, PTSD, who presented to the ED with c/o 5 days of productive cough, shortness of breath.  Today he notes some blood flecks in his sputum.  Reports subjective fevers and chills, with sweats.  Patient was at [facility] ED yesterday thought to have COPD exacerbation chest x-ray was clear patient was discharged home as his cramps improved with the steroids and bronchodilators. Today his symptoms felt worse.  He reports sharp anterior chest pain right lateral wall chest pain present only with coughing lasting a few seconds.  ED Course: Tachycardic to 114, blood pressure 104/59 respiratory rate 23.  O2 sats greater than 94% on room air.  WBC- 11.8.  Lactic acid WNL 1.79.  K- 3.3 Two-view chest x-ray right base pneumonia.  EKG tachycardic 113, old RBBB.  Patient started on IV Levaquin, hospitalist to admit for community-acquired pneumonia.  Review of Systems: As per HPI all other systems reviewed and negative.  Past Medical History:  Diagnosis Date  . Adenomatous colon polyp 2009  . Alcohol abuse    abstinent since 1992  . Arthritis    back  . Asthma   . Barrett's esophagus 05/22/2015  . Chronic back pain   . COPD (chronic obstructive pulmonary disease) ([place])   . Coronary artery disease    70-80% ostial LAD '10  . GERD (gastroesophageal reflux disease)    takes Nexium daily  . Gout    takes ALlopurinol daily  . Hemorrhoids   . History of bronchitis 2015  . History of kidney stones   . Hyperlipidemia    was given a script a month ago but scared to take it.Medical Md is aware  . Hypertension    takes  Losartan,Metoprolol,and Amlodipine daily  . Joint pain   . Lymphocytic colitis 2009  . Myocardial infarction [facility]) 2010   "light" (admit for CP 11/2009; ruled out for MI but had 70-80% ostail LAD stenosis and treated medically following NL perfusion study)  . NASH (nonalcoholic steatohepatitis)   . Peripheral vascular disease ([place])   . Personal history of colonic polyps 07/20/2002   Diminutive adenomas (3) 06/2002 diminutive adenoma (1) 2011   . Pre-diabetes   . Stroke ([place])   . TIA (transient ischemic attack)   . Urinary urgency     Past Surgical History:  Procedure Laterality Date  . ANEURYSM COILING  12/2010   cerebral  . BAND HEMORRHOIDECTOMY  2003   at sigmoidoscopy  . CARDIAC CATHETERIZATION  2010  . CHOLECYSTECTOMY  08/03/2015  . CHOLECYSTECTOMY N/A 08/03/2015   Procedure: LAPAROSCOPIC CHOLECYSTECTOMY WITH INTRAOPERATIVE CHOLANGIOGRAM;  Surgeon: Fanny Skates, MD;  Location: [place];  Service: General;  Laterality: N/A;  . COLONOSCOPY  multiple  . ESOPHAGOGASTRODUODENOSCOPY    . KNEE ARTHROSCOPY WITH MEDIAL MENISECTOMY Left 05/20/2017   Procedure: LEFT KNEE ARTHROSCOPY WITH PARTIAL MEDIAL MENISCECTOMY;  Surgeon: Leandrew Koyanagi, MD;  Location: [place];  Service: Orthopedics;  Laterality: Left;  . LUMBAR SPINE SURGERY     x 2  . NASAL SINUS SURGERY    . TEE WITHOUT CARDIOVERSION  05/28/2012   Procedure: TRANSESOPHAGEAL ECHOCARDIOGRAM (TEE);  Surgeon:  Lelon Perla, MD;  Location[facility] ENDOSCOPY;  Service: Cardiovascular;  Laterality: N/A;     reports that he has been smoking cigarettes. He has a 4.00 pack-year smoking history. He has never used smokeless tobacco. He reports that he does not drink alcohol or use drugs.   Allergy- penicillin-hives, rash   Family History  Problem Relation Age of Onset  . Liver disease Mother   . Liver cancer Mother   . Breast cancer Mother   . Cancer Mother   . Hypertension Mother   . Heart attack Mother   . Cancer Father    . Heart disease Father        before age 34  . Hyperlipidemia Father   . Hypertension Father   . Heart attack Father   . Heart disease Unknown        multiple family members on maternal and paternal side of family  . Diabetes Sister   . Hyperlipidemia Sister   . Hypertension Sister   . Diabetes Paternal Grandmother   . Hypertension Brother   . Colon cancer Neg Hx     Prior to Admission medications   Medication Sig Start Date End Date Taking? Authorizing Provider  albuterol (PROVENTIL HFA;VENTOLIN HFA) 108 (90 Base) MCG/ACT inhaler Inhale into the lungs every 6 (six) hours as needed for wheezing or shortness of breath.    [provider]  allopurinol (ZYLOPRIM) 100 MG tablet Take 100 mg by mouth daily.    [provider]  amLODipine (NORVASC) 10 MG tablet Take 10 mg by mouth daily.    [provider]  aspirin 325 MG tablet Take 325 mg by mouth daily.    [provider]  clotrimazole (LOTRIMIN) 1 % cream Apply to affected area 2 times daily 01/27/18   Khatri, Hina, PA-C  diclofenac sodium (VOLTAREN) 1 % GEL Apply 2 g topically 4 (four) times daily. 04/12/18   Leandrew Koyanagi, MD  esomeprazole (NEXIUM) 40 MG capsule Take 40 mg by mouth 2 (two) times daily.    [provider]  HYDROcodone-acetaminophen (NORCO) 5-325 MG tablet Take 1-2 tablets by mouth every 6 (six) hours as needed. Patient not taking: Reported on 07/16/2017 05/20/17   Leandrew Koyanagi, MD  ipratropium-albuterol (DUONEB) 0.5-2.5 (3) MG/3ML SOLN Take 3 mLs by nebulization every 8 (eight) hours as needed. 11/22/18   Tacy Learn, PA-C  losartan (COZAAR) 100 MG tablet Take 100 mg by mouth daily.    [provider]  metoprolol (LOPRESSOR) 100 MG tablet Take 100 mg by mouth 2 (two) times daily.    [provider]  Milk Thistle 500 MG CAPS Take 1,000 mg by mouth daily.     [provider]  Multiple Vitamin (MULITIVITAMIN WITH MINERALS) TABS Take 1 tablet by mouth  daily.    [provider]  naproxen (NAPROSYN) 500 MG tablet Take 1 tablet (500 mg total) by mouth 2 (two) times daily. 01/27/18   Khatri, Hina, PA-C  niacin (NIASPAN) 1000 MG CR tablet Take 1,000 mg by mouth at bedtime.    [provider]  nitroGLYCERIN (NITROSTAT) 0.4 MG SL tablet Place 0.4 mg under the tongue every 5 (five) minutes as needed. For chest pain    [provider]  Omega-3 Fatty Acids (FISH OIL) 1200 MG CPDR Take 1,200 mg by mouth daily.    [provider]  ondansetron (ZOFRAN) 4 MG tablet Take 1-2 tablets (4-8 mg total) by mouth every 8 (eight) hours as  needed for nausea or vomiting. Patient not taking: Reported on 07/16/2017 05/20/17   Leandrew Koyanagi, MD  predniSONE (DELTASONE) 10 MG tablet Take 4 tablets (40 mg total) by mouth daily for 4 days. 11/22/18 11/26/18  Tacy Learn, PA-C  promethazine (PHENERGAN) 25 MG tablet Take 1 tablet (25 mg total) by mouth every 6 (six) hours as needed for nausea. Patient not taking: Reported on 07/16/2017 05/20/17   Leandrew Koyanagi, MD  senna-docusate (SENOKOT S) 8.6-50 MG tablet Take 1 tablet by mouth at bedtime as needed. Patient not taking: Reported on 07/16/2017 05/20/17   Leandrew Koyanagi, MD  traMADol (ULTRAM) 50 MG tablet Take 1 tablet (50 mg total) by mouth every 6 (six) hours as needed. Patient not taking: Reported on 07/16/2017 06/02/17   Leandrew Koyanagi, MD  traZODone (DESYREL) 50 MG tablet Take 50 mg by mouth at bedtime.    [provider]    Physical Exam: Vitals:   11/23/18 1336 11/23/18 1337 11/23/18 1509  BP: (!) 104/59    Pulse: (!) 114    Resp: (!) 23    Temp: 99.7 F (37.6 C)    TempSrc: Oral    SpO2: 94%  95%  Weight:  99.8 kg   Height:  5' 8"  (1.727 m)     Constitutional: NAD, calm, comfortable Vitals:   11/23/18 1336 11/23/18 1337 11/23/18 1509  BP: (!) 104/59    Pulse: (!) 114    Resp: (!) 23    Temp: 99.7 F (37.6 C)    TempSrc: Oral    SpO2: 94%  95%  Weight:  99.8 kg     Height:  5' 8"  (1.727 m)    Eyes: PERRL, lids and conjunctivae normal ENMT: Mucous membranes are dry. Posterior pharynx clear of any exudate or lesions. Neck: normal, supple, no masses, no thyromegaly Respiratory: Diffuse wheezing, no crackles. Normal respiratory effort. No accessory muscle use.  Cardiovascular: Tachycardia, but Regular rate and rhythm, no murmurs / rubs / gallops. No extremity edema. 2+ pedal pulses.  Abdomen: no tenderness, no masses palpated. No hepatosplenomegaly. Bowel sounds positive.  Musculoskeletal: no clubbing / cyanosis. No joint deformity upper and lower extremities. Good ROM, no contractures. Normal muscle tone.  Skin: no rashes, lesions, ulcers. No induration Neurologic: CN 2-12 grossly intact. Strength 5/5 in all 4.  Psychiatric: Normal judgment and insight. Alert and oriented x 3. Normal mood.   Labs on Admission: I have personally reviewed following labs and imaging studies  CBC: Recent Labs  Lab 11/22/18 0915 11/23/18 1422  WBC 10.8* 11.8*  NEUTROABS  --  9.2*  HGB 15.1 14.9  HCT 47.6 45.3  MCV 90.0 88.5  PLT 152 094*   Basic Metabolic Panel: Recent Labs  Lab 11/22/18 0915 11/23/18 1422  NA 136 134*  K 3.8 3.3*  CL 104 101  CO2 22 22  GLUCOSE 114* 111*  BUN 13 24*  CREATININE 0.81 0.96  CALCIUM 9.2 9.1    Radiological Exams on Admission: Dg Chest 2 View  Result Date: 11/23/2018 CLINICAL DATA:  Chest congestion, shortness of breath EXAM: CHEST - 2 VIEW COMPARISON:  11/23/2015 FINDINGS: Cardiac shadow is stable. Lungs are well aerated bilaterally. New right basilar infiltrate is noted laterally. This was not visualized on the prior exam and projects primarily in the right middle lobe. No sizable effusion is seen. No bony abnormality is noted. IMPRESSION: New right basilar pneumonia. Electronically Signed   By: Inez Catalina M.D.   On:  11/23/2018 14:19   Dg Chest 2 View  Result Date: 11/22/2018 CLINICAL DATA:  Shortness of breath,  cold symptoms EXAM: CHEST - 2 VIEW COMPARISON:  10/29/2017 FINDINGS: Minimal enlargement of cardiac silhouette. Mediastinal contours and pulmonary vascularity normal. Peribronchial thickening and slight accentuation of perihilar markings likely reflect bronchitis. No acute infiltrate, pleural effusion or pneumothorax. Bones unremarkable. IMPRESSION: Bronchitic changes without infiltrate. Minimal enlargement of cardiac silhouette. Electronically Signed   By: Lavonia Dana M.D.   On: 11/22/2018 09:52    EKG: Independently reviewed.  Unchanged from prior.  RBBB.  Sinus tach 113.  QTc 449.  Assessment/Plan Active Problems:   Community acquired pneumonia   Community acquired pneumonia- SOB, active cough.  Tachycardic, tachypneic.  Soft blood pressure.  Two-view chest x-ray right base pneumonia.  Lactic acid 1.79.  WBC 11.8.  Negative History of influenza.  Patient meets SIRs criteria. -Continue IV Levaquin started in ED -Supplimental O2, mucolytics, -CBC BMP a.m. - N/s 526m bolus, Cont N/s + 40Kcl 125 cc/hr x 15 hrs -Follow-up blood cultures ordered in the ED  COPD exacerbation-wheezing on exam likely aggravated by CAP.  Room air.  Smokes half pack per day. -Duo nebs scheduled as needed -IV Solu-Medrol 60 twice daily -Mucolytics  HTN-hypotensive blood pressure. -Hydrate -Hold home metoprolol 100 BID, losartan, Norvasc  Mild hypokalemia- 3.3.  - Replete - Check Mag- 1.4, Replete - BMP a.m  HIV as part of routine health screening  DVT prophylaxis: Lovenox Code Status: Full Family Communication: None at bedside Disposition Plan: per rounding team Consults called: None Admission status: Obs, tele   EBethena RoysMD [facility] Pager [phone] 3PM-11PM.  Otherwise please contact night-coverage [email or web address] Password [email or web address] 11/23/2018, 4:18 PM

## 2018-11-23 NOTE — ED Triage Notes (Addendum)
Pt c/o of sob since last Thursday.  Was seen at [facility] yesterday and states they did not find anything. HX of COPD.  No oxygen at home. Productive cough with red sputum.  Albuterol given in route by EMS

## 2018-11-23 NOTE — Progress Notes (Signed)
Pharmacy Antibiotic Note  [name] [name] is a 57 y.o. male admitted on 11/23/2018 with CAP  Pharmacy has been consulted for levaquin dosing.  Plan: Levaquin 720m IV q24h F/U cxs and clinical progress Monitor V/S, labs F/U transition to PO as indicated  Height: 5' 8"  (172.7 cm) Weight: 220 lb (99.8 kg) IBW/kg (Calculated) : 68.4  Temp (24hrs), Avg:99.7 F (37.6 C), Min:99.7 F (37.6 C), Max:99.7 F (37.6 C)  Recent Labs  Lab 11/22/18 0915  WBC 10.8*  CREATININE 0.81    Estimated Creatinine Clearance: 113.9 mL/min (by C-G formula based on SCr of 0.81 mg/dL).     Antimicrobials this admission: Levaquin 11/26>>   Dose adjustments this admission: N/A  Microbiology results: 11/26 BCx: pending  Thank you for allowing pharmacy to be a part of this patient's care.  LIsac Sarna BS PVena Austria BCaliforniaClinical Pharmacist Pager #[phone]/26/2019 3:02 PM

## 2018-11-24 ENCOUNTER — Encounter (HOSPITAL_COMMUNITY): Payer: Self-pay | Admitting: Family Medicine

## 2018-11-24 DIAGNOSIS — E876 Hypokalemia: Secondary | ICD-10-CM | POA: Diagnosis present

## 2018-11-24 DIAGNOSIS — A419 Sepsis, unspecified organism: Secondary | ICD-10-CM | POA: Diagnosis not present

## 2018-11-24 DIAGNOSIS — J449 Chronic obstructive pulmonary disease, unspecified: Secondary | ICD-10-CM | POA: Diagnosis present

## 2018-11-24 DIAGNOSIS — E785 Hyperlipidemia, unspecified: Secondary | ICD-10-CM | POA: Diagnosis present

## 2018-11-24 DIAGNOSIS — T380X5A Adverse effect of glucocorticoids and synthetic analogues, initial encounter: Secondary | ICD-10-CM | POA: Diagnosis present

## 2018-11-24 DIAGNOSIS — K7581 Nonalcoholic steatohepatitis (NASH): Secondary | ICD-10-CM | POA: Diagnosis present

## 2018-11-24 DIAGNOSIS — I959 Hypotension, unspecified: Secondary | ICD-10-CM | POA: Diagnosis present

## 2018-11-24 DIAGNOSIS — R739 Hyperglycemia, unspecified: Secondary | ICD-10-CM | POA: Diagnosis not present

## 2018-11-24 DIAGNOSIS — K219 Gastro-esophageal reflux disease without esophagitis: Secondary | ICD-10-CM | POA: Diagnosis not present

## 2018-11-24 DIAGNOSIS — J441 Chronic obstructive pulmonary disease with (acute) exacerbation: Secondary | ICD-10-CM

## 2018-11-24 DIAGNOSIS — J181 Lobar pneumonia, unspecified organism: Secondary | ICD-10-CM | POA: Diagnosis present

## 2018-11-24 DIAGNOSIS — J440 Chronic obstructive pulmonary disease with acute lower respiratory infection: Secondary | ICD-10-CM | POA: Diagnosis present

## 2018-11-24 DIAGNOSIS — Z8673 Personal history of transient ischemic attack (TIA), and cerebral infarction without residual deficits: Secondary | ICD-10-CM | POA: Diagnosis not present

## 2018-11-24 DIAGNOSIS — Z7982 Long term (current) use of aspirin: Secondary | ICD-10-CM | POA: Diagnosis not present

## 2018-11-24 DIAGNOSIS — R6520 Severe sepsis without septic shock: Secondary | ICD-10-CM

## 2018-11-24 DIAGNOSIS — I2510 Atherosclerotic heart disease of native coronary artery without angina pectoris: Secondary | ICD-10-CM | POA: Diagnosis present

## 2018-11-24 DIAGNOSIS — R0602 Shortness of breath: Secondary | ICD-10-CM | POA: Diagnosis not present

## 2018-11-24 DIAGNOSIS — I739 Peripheral vascular disease, unspecified: Secondary | ICD-10-CM | POA: Diagnosis present

## 2018-11-24 DIAGNOSIS — I10 Essential (primary) hypertension: Secondary | ICD-10-CM | POA: Diagnosis present

## 2018-11-24 DIAGNOSIS — I252 Old myocardial infarction: Secondary | ICD-10-CM | POA: Diagnosis not present

## 2018-11-24 DIAGNOSIS — J9601 Acute respiratory failure with hypoxia: Secondary | ICD-10-CM | POA: Diagnosis present

## 2018-11-24 DIAGNOSIS — M109 Gout, unspecified: Secondary | ICD-10-CM | POA: Diagnosis present

## 2018-11-24 DIAGNOSIS — Z79899 Other long term (current) drug therapy: Secondary | ICD-10-CM | POA: Diagnosis not present

## 2018-11-24 DIAGNOSIS — F4310 Post-traumatic stress disorder, unspecified: Secondary | ICD-10-CM | POA: Diagnosis present

## 2018-11-24 DIAGNOSIS — F17210 Nicotine dependence, cigarettes, uncomplicated: Secondary | ICD-10-CM | POA: Diagnosis present

## 2018-11-24 DIAGNOSIS — J189 Pneumonia, unspecified organism: Secondary | ICD-10-CM | POA: Diagnosis present

## 2018-11-24 LAB — LACTIC ACID, PLASMA
LACTIC ACID, VENOUS: 3.5 mmol/L — AB (ref 0.5–1.9)
Lactic Acid, Venous: 5.4 mmol/L (ref 0.5–1.9)
Lactic Acid, Venous: 2.6 mmol/L (ref 0.5–1.9)
Lactic Acid, Venous: 2.5 mmol/L (ref 0.5–1.9)
Lactic Acid, Venous: 2.5 mmol/L (ref 0.5–1.9)
Lactic Acid, Venous: 1.8 mmol/L (ref 0.5–1.9)

## 2018-11-24 LAB — CBC
HEMATOCRIT: 38.7 % — AB (ref 39.0–52.0)
HEMOGLOBIN: 12.6 g/dL — AB (ref 13.0–17.0)
MCH: 29.4 pg (ref 26.0–34.0)
MCHC: 32.6 g/dL (ref 30.0–36.0)
MCV: 90.2 fL (ref 80.0–100.0)
NRBC: 0 % (ref 0.0–0.2)
Platelets: 117 10*3/uL — ABNORMAL LOW (ref 150–400)
RBC: 4.29 MIL/uL (ref 4.22–5.81)
RDW: 12.3 % (ref 11.5–15.5)
WBC: 9.8 10*3/uL (ref 4.0–10.5)

## 2018-11-24 LAB — GLUCOSE, CAPILLARY
GLUCOSE-CAPILLARY: 187 mg/dL — AB (ref 70–99)
GLUCOSE-CAPILLARY: 191 mg/dL — AB (ref 70–99)
Glucose-Capillary: 206 mg/dL — ABNORMAL HIGH (ref 70–99)
Glucose-Capillary: 199 mg/dL — ABNORMAL HIGH (ref 70–99)

## 2018-11-24 LAB — BASIC METABOLIC PANEL
ANION GAP: 9 (ref 5–15)
BUN: 20 mg/dL (ref 6–20)
CHLORIDE: 110 mmol/L (ref 98–111)
CO2: 19 mmol/L — AB (ref 22–32)
CREATININE: 0.86 mg/dL (ref 0.61–1.24)
Calcium: 8.7 mg/dL — ABNORMAL LOW (ref 8.9–10.3)
GFR calc non Af Amer: >60 mL/min (ref >60)
Glucose, Bld: 250 mg/dL — ABNORMAL HIGH (ref 70–99)
POTASSIUM: 3.5 mmol/L (ref 3.5–5.1)
SODIUM: 138 mmol/L (ref 135–145)

## 2018-11-24 LAB — HEMOGLOBIN A1C
HEMOGLOBIN A1C: 5.8 % — AB (ref 4.8–5.6)
MEAN PLASMA GLUCOSE: 119.76 mg/dL

## 2018-11-24 LAB — MAGNESIUM: Magnesium: 2 mg/dL (ref 1.7–2.4)

## 2018-11-24 LAB — HIV ANTIBODY (ROUTINE TESTING W REFLEX): HIV Screen 4th Generation wRfx: NONREACTIVE

## 2018-11-24 MED ORDER — SODIUM CHLORIDE 0.9 % IV BOLUS: 1000.0000 mL | Freq: Once | INTRAVENOUS | Status: DC

## 2018-11-24 MED ORDER — SODIUM CHLORIDE 0.9 % IV BOLUS (SEPSIS)
1000.0000 mL | Freq: Once | INTRAVENOUS | Status: AC
  Administered 2018-11-24: 1000 mL via INTRAVENOUS

## 2018-11-24 MED ORDER — INSULIN ASPART 100 UNIT/ML ~~LOC~~ SOLN
3.0000 [IU] | Freq: Three times a day (TID) | SUBCUTANEOUS | Status: DC
  Administered 2018-11-24 – 2018-11-29 (×15): 3 [IU] via SUBCUTANEOUS

## 2018-11-24 MED ORDER — METHYLPREDNISOLONE SODIUM SUCC 40 MG IJ SOLR
20.0000 mg | Freq: Two times a day (BID) | INTRAMUSCULAR | Status: DC
  Administered 2018-11-24 – 2018-11-25 (×2): 20 mg via INTRAVENOUS
  Filled 2018-11-24 (×2): qty 1

## 2018-11-24 MED ORDER — SODIUM CHLORIDE 0.9 % IV BOLUS
1000.0000 mL | Freq: Once | INTRAVENOUS | Status: AC
  Administered 2018-11-24: 1000 mL via INTRAVENOUS

## 2018-11-24 MED ORDER — SODIUM CHLORIDE 0.9 % IV BOLUS
500.0000 mL | Freq: Once | INTRAVENOUS | Status: AC
  Administered 2018-11-24: 500 mL via INTRAVENOUS

## 2018-11-24 MED ORDER — INSULIN ASPART 100 UNIT/ML ~~LOC~~ SOLN: 0.0000 [IU] | Freq: Every day | SUBCUTANEOUS | Status: DC

## 2018-11-24 MED ORDER — INSULIN ASPART 100 UNIT/ML ~~LOC~~ SOLN
0.0000 [IU] | Freq: Three times a day (TID) | SUBCUTANEOUS | Status: DC
  Administered 2018-11-24: 3 [IU] via SUBCUTANEOUS
  Administered 2018-11-24: 5 [IU] via SUBCUTANEOUS
  Administered 2018-11-24 – 2018-11-25 (×2): 3 [IU] via SUBCUTANEOUS
  Administered 2018-11-25 – 2018-11-26 (×3): 2 [IU] via SUBCUTANEOUS
  Administered 2018-11-26: 3 [IU] via SUBCUTANEOUS
  Administered 2018-11-27 (×2): 2 [IU] via SUBCUTANEOUS
  Administered 2018-11-28 (×2): 1 [IU] via SUBCUTANEOUS
  Administered 2018-11-29: 2 [IU] via SUBCUTANEOUS

## 2018-11-24 MED ORDER — ACETAMINOPHEN 325 MG PO TABS
650.0000 mg | ORAL_TABLET | Freq: Four times a day (QID) | ORAL | Status: DC
  Administered 2018-11-24 – 2018-11-29 (×17): 650 mg via ORAL
  Filled 2018-11-24 (×19): qty 2

## 2018-11-24 MED ORDER — POTASSIUM CHLORIDE IN NACL 40-0.9 MEQ/L-% IV SOLN
INTRAVENOUS | Status: AC
  Administered 2018-11-24 – 2018-11-25 (×2): 150 mL/h via INTRAVENOUS
  Administered 2018-11-26: 50 mL/h via INTRAVENOUS

## 2018-11-24 MED ORDER — ACETAMINOPHEN 650 MG RE SUPP: 650.0000 mg | Freq: Four times a day (QID) | RECTAL | Status: DC

## 2018-11-24 NOTE — Care Management Obs Status (Signed)
[place] NOTIFICATION   Patient Details  Name: [name] [name] MRN: [id number] Date of Birth: December 17, 1961   Medicare Observation Status Notification Given:  Yes    Sherald Barge, RN 11/24/2018, 10:57 AM

## 2018-11-24 NOTE — Progress Notes (Signed)
PROGRESS NOTE    [name] [name]  SNK:[id number]  DOB: 1961-09-17  DOA: 11/23/2018 PCP: Shirline Frees, MD   Brief Admission Hx: 57 y.o. male with medical history significant for HTN, CVA, CAD, COPD, NASH, PVD, PTSD, who presented to the ED with c/o 5 days of productive cough, shortness of breath.  He was admitted with severe sepsis and pneumonia.    MDM/Assessment & Plan:   1. Severe Sepsis - secondary to dense pneumonia - Pt having rigors. Continue IV antibiotics and supportive care.   Follow closely.  Lactic acid is elevated but trending down.  Continue to follow lactate until normalized. Low threshold to move to higher level of care.  At this time, blood pressure and oxygenation holding stable.  Following.  2. Community acquired pneumonia - Pt has a dense right lower lobe pneumonia.   He is having rigors.  Continue IV levofloxacin.  Follow blood cultures.  Strep pneum and legionella testing is still pending.  He is being made full inpatient.   Influenza testing was negative.  3. Acute COPD exacerbation - secondary to pneumonia - Treating supportively.  Continue nebs, steroids, antibiotics, mucolytics.  4. Essential hypertension - Holding blood pressure medications due to lower blood pressures on arrival. Continue to follow closely.   5. Hypokalemia- improving with IV fluid replacement, monitor magnesium levels.    DVT prophylaxis: lovenox Code Status: Full  Family Communication: patient updated at bedside  Disposition Plan: Pt needs ongoing inpatient care with IV antibiotics, IV fluid resuscitation  Consultants:  n/a  Procedures:  n/a  Antimicrobials:  Levofloxacin 11/26 >   Subjective: Pt feels ill.  He is having rigors.  No fever.  He is coughing and wheezing.    Objective: Vitals:   11/23/18 2145 11/24/18 0203 11/24/18 0539 11/24/18 0839  BP: 133/66  136/76   Pulse: 100  83   Resp:      Temp: 98 F (36.7 C)  98.3 F (36.8 C)   TempSrc: Oral  Oral     SpO2: 93% 95% 94% 94%  Weight:      Height:        Intake/Output Summary (Last 24 hours) at 11/24/2018 1334 Last data filed at 11/24/2018 1200 Gross per 24 hour  Intake 1775.82 ml  Output -  Net 1775.82 ml   Filed Weights   11/23/18 1337  Weight: 99.8 kg    REVIEW OF SYSTEMS  As per history otherwise all reviewed and reported negative  Exam:  General exam: awake, alert, NAD cooperative, sitting up in bed.  Speaking full sentences.   Respiratory system: diffuse rales heard RLL, dimished BS right base. Mild increased work of breathing. Cardiovascular system: S1 & S2 heard, RRR. No JVD, murmurs, gallops, clicks or pedal edema. Gastrointestinal system: Abdomen is nondistended, soft and nontender. Normal bowel sounds heard. Central nervous system: Alert and oriented. No focal neurological deficits. Extremities: no CCE.  Data Reviewed: Basic Metabolic Panel: Recent Labs  Lab 11/22/18 0915 11/23/18 1422 11/24/18 0510  NA 136 134* 138  K 3.8 3.3* 3.5  CL 104 101 110  CO2 22 22 19*  GLUCOSE 114* 111* 250*  BUN 13 24* 20  CREATININE 0.81 0.96 0.86  CALCIUM 9.2 9.1 8.7*  MG  --  1.4* 2.0   Liver Function Tests: No results for input(s): AST, ALT, ALKPHOS, BILITOT, PROT, ALBUMIN in the last 168 hours. No results for input(s): LIPASE, AMYLASE in the last 168 hours. No results for input(s): AMMONIA in the  last 168 hours. CBC: Recent Labs  Lab 11/22/18 0915 11/23/18 1422 11/24/18 0510  WBC 10.8* 11.8* 9.8  NEUTROABS  --  9.2*  --   HGB 15.1 14.9 12.6*  HCT 47.6 45.3 38.7*  MCV 90.0 88.5 90.2  PLT 152 126* 117*   Cardiac Enzymes: Recent Labs  Lab 11/23/18 1759 11/23/18 2254  TROPONINI 0.04* <0.03   CBG (last 3)  Recent Labs    11/24/18 0839 11/24/18 1156  GLUCAP 187* 206*   Recent Results (from the past 240 hour(s))  Blood culture (routine x 2)     Status: None (Preliminary result)   Collection Time: 11/23/18  2:22 PM  Result Value Ref Range Status    Specimen Description BLOOD SITE NOT SPECIFIED DRAWN BY RN  Final   Special Requests   Final    BOTTLES DRAWN AEROBIC AND ANAEROBIC Blood Culture adequate volume   Culture   Final    NO GROWTH < 24 HOURS Performed at [facility], [address] [place], [place] [address]    Report Status PENDING  Incomplete  Blood culture (routine x 2)     Status: None (Preliminary result)   Collection Time: 11/23/18  2:29 PM  Result Value Ref Range Status   Specimen Description BLOOD SITE NOT SPECIFIED  Final   Special Requests   Final    BOTTLES DRAWN AEROBIC AND ANAEROBIC Blood Culture results may not be optimal due to an inadequate volume of blood received in culture bottles   Culture   Final    NO GROWTH < 24 HOURS Performed at [facility], [address] [place],  [address]    Report Status PENDING  Incomplete     Studies: Dg Chest 2 View  Result Date: 11/23/2018 CLINICAL DATA:  Chest congestion, shortness of breath EXAM: CHEST - 2 VIEW COMPARISON:  11/23/2015 FINDINGS: Cardiac shadow is stable. Lungs are well aerated bilaterally. New right basilar infiltrate is noted laterally. This was not visualized on the prior exam and projects primarily in the right middle lobe. No sizable effusion is seen. No bony abnormality is noted. IMPRESSION: New right basilar pneumonia. Electronically Signed   By: Inez Catalina M.D.   On: 11/23/2018 14:19     Scheduled Meds: . allopurinol  100 mg Oral q morning - 10a  . aspirin  325 mg Oral q morning - 10a  . dextromethorphan-guaiFENesin  1 tablet Oral BID  . enoxaparin (LOVENOX) injection  40 mg Subcutaneous Q24H  . Influenza vac split quadrivalent PF  0.5 mL Intramuscular Tomorrow-1000  . insulin aspart  0-15 Units Subcutaneous TID WC  . insulin aspart  0-5 Units Subcutaneous QHS  . insulin aspart  3 Units Subcutaneous TID WC  . ipratropium-albuterol  3 mL Nebulization Q6H  . methylPREDNISolone (SOLU-MEDROL) injection  20 mg Intravenous Q12H  .  nicotine  14 mg Transdermal Daily  . pantoprazole  40 mg Oral Daily  . traZODone  100 mg Oral QHS   Continuous Infusions: . 0.9 % NaCl with KCl 40 mEq / L    . levofloxacin (LEVAQUIN) IV      Principal Problem:   Community acquired pneumonia of right middle lobe of lung ([place]) Active Problems:   GOUT   Essential hypertension   GERD   CAD (coronary artery disease)   History of CVA (cerebrovascular accident)   Sepsis ([place])   Steroid-induced hyperglycemia   NASH (nonalcoholic steatohepatitis)   COPD (chronic obstructive pulmonary disease) ([place])   Community acquired  pneumonia   Time spent:   Irwin Brakeman, MD, FAAFP [facility] Pager [phone]  If 7PM-7AM, please contact night-coverage [email or web address] Password [email or web address] 11/24/2018, 1:34 PM    LOS: 0 days

## 2018-11-24 NOTE — Progress Notes (Signed)
CRITICAL VALUE ALERT  Critical Value:  Lactic Acid 3.5  Date & Time Notied:  10/30/18 2033  Provider Notified: Midlevel  Orders Received/Actions taken: Sodium chloride 0.9% bolus 500 mL given. Repeat lactic acid lab draw at 2300

## 2018-11-24 NOTE — Progress Notes (Signed)
11/24/2018 2:55 PM  Lactic acid remains 2.5:  Initiated sepsis bundle.  See orders. Repeat lactic acid ordered.  I just went back to assess patient and he is mentating well, vitals stable, he does have chills, IV levofloxacin is scheduled to be given now.  Follow.   Murvin Natal MD

## 2018-11-24 NOTE — Plan of Care (Signed)
  Problem: Education: Goal: Knowledge of General Education information will improve Description Including pain rating scale, medication(s)/side effects and non-pharmacologic comfort measures 11/24/2018 0743 by Rance Muir, RN Outcome: Progressing 11/23/2018 1801 by Rance Muir, RN Outcome: Progressing   Problem: Health Behavior/Discharge Planning: Goal: Ability to manage health-related needs will improve 11/24/2018 0743 by Rance Muir, RN Outcome: Progressing 11/23/2018 1801 by Rance Muir, RN Outcome: Progressing   Problem: Clinical Measurements: Goal: Ability to maintain clinical measurements within normal limits will improve 11/24/2018 0743 by Rance Muir, RN Outcome: Progressing 11/23/2018 1801 by Rance Muir, RN Outcome: Progressing Goal: Will remain free from infection 11/24/2018 0743 by Rance Muir, RN Outcome: Progressing 11/23/2018 1801 by Rance Muir, RN Outcome: Progressing Goal: Diagnostic test results will improve 11/24/2018 0743 by Rance Muir, RN Outcome: Progressing 11/23/2018 1801 by Rance Muir, RN Outcome: Progressing Goal: Respiratory complications will improve 11/24/2018 0743 by Rance Muir, RN Outcome: Progressing 11/23/2018 1801 by Rance Muir, RN Outcome: Progressing Goal: Cardiovascular complication will be avoided 11/24/2018 0743 by Rance Muir, RN Outcome: Progressing 11/23/2018 1801 by Rance Muir, RN Outcome: Progressing   Problem: Activity: Goal: Risk for activity intolerance will decrease 11/24/2018 0743 by Rance Muir, RN Outcome: Progressing 11/23/2018 1801 by Rance Muir, RN Outcome: Progressing   Problem: Nutrition: Goal: Adequate nutrition will be maintained 11/24/2018 0743 by Rance Muir, RN Outcome: Progressing 11/23/2018 1801 by Rance Muir, RN Outcome: Progressing   Problem: Coping: Goal: Level of anxiety will decrease 11/24/2018 0743 by Rance Muir, RN Outcome: Progressing 11/23/2018 1801 by Rance Muir, RN Outcome: Progressing   Problem:  Elimination: Goal: Will not experience complications related to bowel motility 11/24/2018 0743 by Rance Muir, RN Outcome: Progressing 11/23/2018 1801 by Rance Muir, RN Outcome: Progressing Goal: Will not experience complications related to urinary retention 11/24/2018 0743 by Rance Muir, RN Outcome: Progressing 11/23/2018 1801 by Rance Muir, RN Outcome: Progressing   Problem: Pain Managment: Goal: General experience of comfort will improve 11/24/2018 0743 by Rance Muir, RN Outcome: Progressing 11/23/2018 1801 by Rance Muir, RN Outcome: Progressing   Problem: Safety: Goal: Ability to remain free from injury will improve 11/24/2018 0743 by Rance Muir, RN Outcome: Progressing 11/23/2018 1801 by Rance Muir, RN Outcome: Progressing   Problem: Skin Integrity: Goal: Risk for impaired skin integrity will decrease 11/24/2018 0743 by Rance Muir, RN Outcome: Progressing 11/23/2018 1801 by Rance Muir, RN Outcome: Progressing

## 2018-11-25 ENCOUNTER — Inpatient Hospital Stay (HOSPITAL_COMMUNITY): Payer: Medicare Other

## 2018-11-25 LAB — GLUCOSE, CAPILLARY
Glucose-Capillary: 167 mg/dL — ABNORMAL HIGH (ref 70–99)
Glucose-Capillary: 189 mg/dL — ABNORMAL HIGH (ref 70–99)
Glucose-Capillary: 130 mg/dL — ABNORMAL HIGH (ref 70–99)
Glucose-Capillary: 125 mg/dL — ABNORMAL HIGH (ref 70–99)
Glucose-Capillary: 163 mg/dL — ABNORMAL HIGH (ref 70–99)

## 2018-11-25 LAB — CBC WITH DIFFERENTIAL/PLATELET
ABS IMMATURE GRANULOCYTES: 0.12 10*3/uL — AB (ref 0.00–0.07)
BASOS ABS: 0 10*3/uL (ref 0.0–0.1)
Basophils Relative: 0 %
Eosinophils Absolute: 0.1 10*3/uL (ref 0.0–0.5)
Eosinophils Relative: 1 %
HEMATOCRIT: 40.1 % (ref 39.0–52.0)
Hemoglobin: 12.7 g/dL — ABNORMAL LOW (ref 13.0–17.0)
IMMATURE GRANULOCYTES: 1 %
LYMPHS ABS: 1.3 10*3/uL (ref 0.7–4.0)
Lymphocytes Relative: 11 %
MCH: 29.1 pg (ref 26.0–34.0)
MCHC: 31.7 g/dL (ref 30.0–36.0)
MCV: 91.8 fL (ref 80.0–100.0)
MONOS PCT: 7 %
Monocytes Absolute: 0.9 10*3/uL (ref 0.1–1.0)
NRBC: 0 % (ref 0.0–0.2)
Neutro Abs: 10.1 10*3/uL — ABNORMAL HIGH (ref 1.7–7.7)
Neutrophils Relative %: 80 %
PLATELETS: 149 10*3/uL — AB (ref 150–400)
RBC: 4.37 MIL/uL (ref 4.22–5.81)
RDW: 12.7 % (ref 11.5–15.5)
WBC: 12.6 10*3/uL — ABNORMAL HIGH (ref 4.0–10.5)

## 2018-11-25 LAB — COMPREHENSIVE METABOLIC PANEL
ALBUMIN: 3.2 g/dL — AB (ref 3.5–5.0)
ALT: 29 U/L (ref 0–44)
AST: 32 U/L (ref 15–41)
Alkaline Phosphatase: 71 U/L (ref 38–126)
Anion gap: 9 (ref 5–15)
BUN: 12 mg/dL (ref 6–20)
CHLORIDE: 114 mmol/L — AB (ref 98–111)
CO2: 17 mmol/L — ABNORMAL LOW (ref 22–32)
CREATININE: 0.64 mg/dL (ref 0.61–1.24)
Calcium: 9.1 mg/dL (ref 8.9–10.3)
GFR calc Af Amer: >60 mL/min (ref >60)
GFR calc non Af Amer: >60 mL/min (ref >60)
GLUCOSE: 204 mg/dL — AB (ref 70–99)
POTASSIUM: 3.9 mmol/L (ref 3.5–5.1)
Sodium: 140 mmol/L (ref 135–145)
Total Bilirubin: 0.6 mg/dL (ref 0.3–1.2)
Total Protein: 6.5 g/dL (ref 6.5–8.1)

## 2018-11-25 LAB — MAGNESIUM: MAGNESIUM: 1.8 mg/dL (ref 1.7–2.4)

## 2018-11-25 MED ORDER — VANCOMYCIN HCL 10 G IV SOLR
2000.0000 mg | Freq: Once | INTRAVENOUS | Status: AC
  Administered 2018-11-25: 2000 mg via INTRAVENOUS
  Filled 2018-11-25: qty 2000

## 2018-11-25 MED ORDER — HYDRALAZINE HCL 20 MG/ML IJ SOLN: 10.0000 mg | INTRAMUSCULAR | Status: DC | PRN

## 2018-11-25 MED ORDER — PANTOPRAZOLE SODIUM 40 MG PO TBEC
40.0000 mg | DELAYED_RELEASE_TABLET | Freq: Two times a day (BID) | ORAL | Status: DC
  Administered 2018-11-25 – 2018-11-29 (×8): 40 mg via ORAL
  Filled 2018-11-25 (×8): qty 1

## 2018-11-25 MED ORDER — LOSARTAN POTASSIUM 50 MG PO TABS: 100.0000 mg | ORAL_TABLET | Freq: Every morning | ORAL | Status: DC

## 2018-11-25 MED ORDER — IPRATROPIUM-ALBUTEROL 0.5-2.5 (3) MG/3ML IN SOLN
3.0000 mL | RESPIRATORY_TRACT | Status: DC
  Administered 2018-11-26 – 2018-11-29 (×20): 3 mL via RESPIRATORY_TRACT
  Filled 2018-11-25 (×20): qty 3

## 2018-11-25 MED ORDER — SODIUM CHLORIDE 0.9 % IV SOLN
2.0000 g | Freq: Three times a day (TID) | INTRAVENOUS | Status: DC
  Administered 2018-11-25 – 2018-11-29 (×13): 2 g via INTRAVENOUS
  Filled 2018-11-25 (×16): qty 2

## 2018-11-25 MED ORDER — FUROSEMIDE 10 MG/ML IJ SOLN
40.0000 mg | Freq: Once | INTRAMUSCULAR | Status: AC
  Administered 2018-11-25: 40 mg via INTRAVENOUS
  Filled 2018-11-25: qty 4

## 2018-11-25 MED ORDER — POTASSIUM CHLORIDE CRYS ER 20 MEQ PO TBCR
40.0000 meq | EXTENDED_RELEASE_TABLET | Freq: Once | ORAL | Status: AC
  Administered 2018-11-25: 40 meq via ORAL
  Filled 2018-11-25: qty 2

## 2018-11-25 MED ORDER — AMLODIPINE BESYLATE 5 MG PO TABS
10.0000 mg | ORAL_TABLET | Freq: Every morning | ORAL | Status: DC
  Administered 2018-11-25 – 2018-11-29 (×5): 10 mg via ORAL
  Filled 2018-11-25 (×5): qty 2

## 2018-11-25 MED ORDER — METHYLPREDNISOLONE SODIUM SUCC 125 MG IJ SOLR
60.0000 mg | Freq: Two times a day (BID) | INTRAMUSCULAR | Status: DC
  Administered 2018-11-25 – 2018-11-29 (×8): 60 mg via INTRAVENOUS
  Filled 2018-11-25 (×8): qty 2

## 2018-11-25 MED ORDER — LOSARTAN POTASSIUM 50 MG PO TABS
100.0000 mg | ORAL_TABLET | Freq: Every day | ORAL | Status: DC
  Administered 2018-11-25 – 2018-11-29 (×5): 100 mg via ORAL
  Filled 2018-11-25 (×6): qty 2

## 2018-11-25 MED ORDER — METOPROLOL TARTRATE 50 MG PO TABS
100.0000 mg | ORAL_TABLET | Freq: Two times a day (BID) | ORAL | Status: DC
  Administered 2018-11-25 – 2018-11-29 (×9): 100 mg via ORAL
  Filled 2018-11-25 (×9): qty 2

## 2018-11-25 MED ORDER — VANCOMYCIN HCL IN DEXTROSE 1-5 GM/200ML-% IV SOLN
1000.0000 mg | Freq: Three times a day (TID) | INTRAVENOUS | Status: DC
  Administered 2018-11-25 – 2018-11-27 (×5): 1000 mg via INTRAVENOUS
  Filled 2018-11-25 (×6): qty 200

## 2018-11-25 NOTE — Progress Notes (Signed)
PROGRESS NOTE  [name] [name]  ZDG:[id number]  DOB: 1961/03/11  DOA: 11/23/2018 PCP: Shirline Frees, MD   Brief Admission Hx: 57 y.o. male with medical history significant for HTN, CVA, CAD, COPD, NASH, PVD, PTSD, who presented to the ED with c/o 5 days of productive cough, shortness of breath.  He was admitted with severe sepsis and pneumonia.    MDM/Assessment & Plan:   1. Severe Sepsis - secondary to dense RLL pneumonia - Xray shows progression.  Will broaden coverage.  Start azactam/vancomycin, dc levofloxacin. Continue IV antibiotics and supportive care.   Follow closely.  Lactic acid is trending down.   Low threshold to move to higher level of care.    2. Community acquired pneumonia - appears to be worsening on CXR.  See above antibiotic changes.  Follow closely.   Follow blood cultures.  Strep pneum and legionella testing is still pending.    Influenza testing was negative.  3. Pulmonary edema - seen on CXR likely from sepsis fluid resuscitation - IV lasix ordered, reduced IV fluids.  Lactate is down to normal range now.  4. Acute COPD exacerbation - secondary to pneumonia - Treating supportively.  Continue nebs, steroids, antibiotics, mucolytics.  5. Essential hypertension - resuming home blood pressure medications.    6. Hypokalemia-Repleted.     DVT prophylaxis: lovenox Code Status: Full  Family Communication: patient updated at bedside  Disposition Plan: Pt needs ongoing inpatient care with IV antibiotics, IV fluids  Consultants:  n/a  Procedures:  n/a  Antimicrobials:  Levofloxacin 11/26 >11/27  Aztreonam 11/28 >  Vancomycin 11/28 >  Subjective: Pt says that he is having less chills today.  He is still SOB especially with minimal ambulation.  He has severe SOB if he tries to go to bathroom.   Objective: Vitals:   11/25/18 0629 11/25/18 0653 11/25/18 0833 11/25/18 0923  BP:  (!) 174/85 (!) 157/97   Pulse:  (!) 108 97   Resp:  (!) 30 (!) 22   Temp:   (!) 97.5 F (36.4 C) 97.8 F (36.6 C)   TempSrc:  Oral Oral   SpO2: 96% 93% 92% 91%  Weight:      Height:        Intake/Output Summary (Last 24 hours) at 11/25/2018 0943 Last data filed at 11/25/2018 5956 Gross per 24 hour  Intake 1562.68 ml  Output 2050 ml  Net -487.32 ml   Filed Weights   11/23/18 1337  Weight: 99.8 kg    REVIEW OF SYSTEMS  As per history otherwise all reviewed and reported negative  Exam:  General exam: awake, alert, NAD cooperative, sitting up in bed.  Speaking full sentences.   Respiratory system: rhonchi RUL RLL, wheezes RLL RML, diffuse rales heard RLL, dimished BS right base. Mild increased work of breathing. Cardiovascular system: S1 & S2 heard, tachycardic. No JVD, murmurs, gallops, clicks or pedal edema. Gastrointestinal system: Abdomen is nondistended, soft and nontender. Normal bowel sounds heard. Central nervous system: Alert and oriented. No focal neurological deficits. Extremities: no CCE.  Data Reviewed: Basic Metabolic Panel: Recent Labs  Lab 11/22/18 0915 11/23/18 1422 11/24/18 0510 11/25/18 0449  NA 136 134* 138 140  K 3.8 3.3* 3.5 3.9  CL 104 101 110 114*  CO2 22 22 19* 17*  GLUCOSE 114* 111* 250* 204*  BUN 13 24* 20 12  CREATININE 0.81 0.96 0.86 0.64  CALCIUM 9.2 9.1 8.7* 9.1  MG  --  1.4* 2.0 1.8   Liver  Function Tests: Recent Labs  Lab 11/25/18 0449  AST 32  ALT 29  ALKPHOS 71  BILITOT 0.6  PROT 6.5  ALBUMIN 3.2*   No results for input(s): LIPASE, AMYLASE in the last 168 hours. No results for input(s): AMMONIA in the last 168 hours. CBC: Recent Labs  Lab 11/22/18 0915 11/23/18 1422 11/24/18 0510 11/25/18 0449  WBC 10.8* 11.8* 9.8 12.6*  NEUTROABS  --  9.2*  --  10.1*  HGB 15.1 14.9 12.6* 12.7*  HCT 47.6 45.3 38.7* 40.1  MCV 90.0 88.5 90.2 91.8  PLT 152 126* 117* 149*   Cardiac Enzymes: Recent Labs  Lab 11/23/18 1759 11/23/18 2254  TROPONINI 0.04* <0.03   CBG (last 3)  Recent Labs     11/24/18 2127 11/25/18 0259 11/25/18 0802  GLUCAP 199* 167* 189*   Recent Results (from the past 240 hour(s))  Blood culture (routine x 2)     Status: None (Preliminary result)   Collection Time: 11/23/18  2:22 PM  Result Value Ref Range Status   Specimen Description BLOOD SITE NOT SPECIFIED DRAWN BY RN  Final   Special Requests   Final    BOTTLES DRAWN AEROBIC AND ANAEROBIC Blood Culture adequate volume   Culture   Final    NO GROWTH 2 DAYS Performed at [facility], [address], [place], [place] [address]    Report Status PENDING  Incomplete  Blood culture (routine x 2)     Status: None (Preliminary result)   Collection Time: 11/23/18  2:29 PM  Result Value Ref Range Status   Specimen Description BLOOD SITE NOT SPECIFIED  Final   Special Requests   Final    BOTTLES DRAWN AEROBIC AND ANAEROBIC Blood Culture results may not be optimal due to an inadequate volume of blood received in culture bottles   Culture   Final    NO GROWTH 2 DAYS Performed at [facility], [address], [place], [place] [address]    Report Status PENDING  Incomplete     Studies: Dg Chest 2 View  Result Date: 11/23/2018 CLINICAL DATA:  Chest congestion, shortness of breath EXAM: CHEST - 2 VIEW COMPARISON:  11/23/2015 FINDINGS: Cardiac shadow is stable. Lungs are well aerated bilaterally. New right basilar infiltrate is noted laterally. This was not visualized on the prior exam and projects primarily in the right middle lobe. No sizable effusion is seen. No bony abnormality is noted. IMPRESSION: New right basilar pneumonia. Electronically Signed   By: Inez Catalina M.D.   On: 11/23/2018 14:19   Dg Chest Port 1 View  Result Date: 11/25/2018 CLINICAL DATA:  Sepsis. Shortness of breath. EXAM: PORTABLE CHEST 1 VIEW COMPARISON:  Frontal and lateral views 11/23/2018 FINDINGS: Development of bilateral perihilar consolidations since prior exam. Peripheral right lung base opacity is again seen. Apparent increased  size cardiac silhouette likely accentuated by portable technique and low lung volumes. There is increased bronchial thickening from prior exam. No pleural effusion or pneumothorax. IMPRESSION: 1. New bilateral perihilar consolidations since radiograph 2 days ago, may reflect pulmonary edema, aspiration, developing ARDS or multifocal pneumonia. Right basilar opacity is unchanged. 2. Increased bronchial thickening from prior exam. 3. Increased size of the cardiac silhouette over the course of 2 days, likely in part accentuated by portable technique. Electronically Signed   By: Keith Rake M.D.   On: 11/25/2018 04:39   Scheduled Meds: . acetaminophen  650 mg Oral Q6H   Or  . acetaminophen  650 mg Rectal Q6H  .  allopurinol  100 mg Oral q morning - 10a  . amLODipine  10 mg Oral q morning - 10a  . aspirin  325 mg Oral q morning - 10a  . dextromethorphan-guaiFENesin  1 tablet Oral BID  . enoxaparin (LOVENOX) injection  40 mg Subcutaneous Q24H  . furosemide  40 mg Intravenous Once  . Influenza vac split quadrivalent PF  0.5 mL Intramuscular Tomorrow-1000  . insulin aspart  0-15 Units Subcutaneous TID WC  . insulin aspart  0-5 Units Subcutaneous QHS  . insulin aspart  3 Units Subcutaneous TID WC  . ipratropium-albuterol  3 mL Nebulization Q6H  . methylPREDNISolone (SOLU-MEDROL) injection  20 mg Intravenous Q12H  . metoprolol tartrate  100 mg Oral BID  . nicotine  14 mg Transdermal Daily  . pantoprazole  40 mg Oral Daily  . potassium chloride  40 mEq Oral Once  . traZODone  100 mg Oral QHS   Continuous Infusions: . 0.9 % NaCl with KCl 40 mEq / L 75 mL/hr (11/25/18 6314)    Principal Problem:   Community acquired pneumonia of right middle lobe of lung ([place]) Active Problems:   GOUT   Essential hypertension   GERD   CAD (coronary artery disease)   History of CVA (cerebrovascular accident)   Sepsis ([place])   Steroid-induced hyperglycemia   NASH (nonalcoholic steatohepatitis)   COPD  (chronic obstructive pulmonary disease) ([place])   Community acquired pneumonia  Time spent:   Irwin Brakeman, MD, FAAFP [facility] Pager [phone]  If 7PM-7AM, please contact night-coverage [email or web address] Password [email or web address] 11/25/2018, 9:43 AM    LOS: 1 day

## 2018-11-25 NOTE — Plan of Care (Signed)

## 2018-11-25 NOTE — Progress Notes (Signed)
Pharmacy Antibiotic Note  [name] [name] is a 57 y.o. male admitted on 11/23/2018 with sepsis.  Pharmacy has been consulted for vancomycin and aztreonam  Dosing. Patient was on levofloxacin, but CXR shows progression of RLL pneumonia.  Plan: start aztreonam 2g IV q8h (clarify PCN allergy) Loading dose:  vancomycin 2g IV x1 dose Maintenance dose:  Vancomycin 1g IV q8h Goal vancomycin   trough range: 15-20    mcg/mL Pharmacy will continue to monitor renal function, vancomycin troughs as clinically appropriate, cultures and patient progress.   Height: 5' 8"  (172.7 cm) Weight: 220 lb (99.8 kg) IBW/kg (Calculated) : 68.4  Temp (24hrs), Avg:98 F (36.7 C), Min:97.5 F (36.4 C), Max:98.6 F (37 C)  Recent Labs  Lab 11/22/18 0915 11/23/18 1422  11/24/18 0510 11/24/18 0831 11/24/18 1402 11/24/18 1947 11/24/18 2241 11/25/18 0449  WBC 10.8* 11.8*  --  9.8  --   --   --   --  12.6*  CREATININE 0.81 0.96  --  0.86  --   --   --   --  0.64  LATICACIDVEN  --   --    < > 2.6* 2.5* 2.5* 3.5* 1.8  --    < > = values in this interval not displayed.    Estimated Creatinine Clearance: 115.3 mL/min (by C-G formula based on SCr of 0.64 mg/dL).     Antimicrobials this admission: Levofloxacin  11/27>>11/28  aztreonam 11/28   >>  vancomycin 11/28 >>      Microbiology results: 11/26 BCx: ng x2 days 11/28 Legionella UA 11/28 S. Pneumo urinary ant     Thank you for allowing pharmacy to be a part of this patient's care.  [name] [name] 11/25/2018 10:02 AM

## 2018-11-26 LAB — CBC WITH DIFFERENTIAL/PLATELET
Abs Immature Granulocytes: 0.33 10*3/uL — ABNORMAL HIGH (ref 0.00–0.07)
Basophils Absolute: 0 10*3/uL (ref 0.0–0.1)
Basophils Relative: 0 %
EOS ABS: 0 10*3/uL (ref 0.0–0.5)
Eosinophils Relative: 0 %
HCT: 40.5 % (ref 39.0–52.0)
Hemoglobin: 13.4 g/dL (ref 13.0–17.0)
Immature Granulocytes: 3 %
Lymphocytes Relative: 15 %
Lymphs Abs: 1.8 10*3/uL (ref 0.7–4.0)
MCH: 29.4 pg (ref 26.0–34.0)
MCHC: 33.1 g/dL (ref 30.0–36.0)
MCV: 88.8 fL (ref 80.0–100.0)
Monocytes Absolute: 0.7 10*3/uL (ref 0.1–1.0)
Monocytes Relative: 6 %
Neutro Abs: 9.4 10*3/uL — ABNORMAL HIGH (ref 1.7–7.7)
Neutrophils Relative %: 76 %
Platelets: 198 10*3/uL (ref 150–400)
RBC: 4.56 MIL/uL (ref 4.22–5.81)
RDW: 12.9 % (ref 11.5–15.5)
WBC: 12.3 10*3/uL — AB (ref 4.0–10.5)
nRBC: 0 % (ref 0.0–0.2)

## 2018-11-26 LAB — COMPREHENSIVE METABOLIC PANEL
ALT: 35 U/L (ref 0–44)
AST: 32 U/L (ref 15–41)
Albumin: 3.2 g/dL — ABNORMAL LOW (ref 3.5–5.0)
Alkaline Phosphatase: 79 U/L (ref 38–126)
Anion gap: 10 (ref 5–15)
BUN: 15 mg/dL (ref 6–20)
CO2: 20 mmol/L — ABNORMAL LOW (ref 22–32)
Calcium: 9.4 mg/dL (ref 8.9–10.3)
Chloride: 107 mmol/L (ref 98–111)
Creatinine, Ser: 0.62 mg/dL (ref 0.61–1.24)
GFR calc Af Amer: >60 mL/min (ref >60)
GFR calc non Af Amer: >60 mL/min (ref >60)
GLUCOSE: 186 mg/dL — AB (ref 70–99)
Potassium: 4 mmol/L (ref 3.5–5.1)
Sodium: 137 mmol/L (ref 135–145)
TOTAL PROTEIN: 6.9 g/dL (ref 6.5–8.1)
Total Bilirubin: 1.1 mg/dL (ref 0.3–1.2)

## 2018-11-26 LAB — GLUCOSE, CAPILLARY
Glucose-Capillary: 166 mg/dL — ABNORMAL HIGH (ref 70–99)
Glucose-Capillary: 149 mg/dL — ABNORMAL HIGH (ref 70–99)
Glucose-Capillary: 155 mg/dL — ABNORMAL HIGH (ref 70–99)
Glucose-Capillary: 86 mg/dL (ref 70–99)
Glucose-Capillary: 168 mg/dL — ABNORMAL HIGH (ref 70–99)

## 2018-11-26 LAB — MAGNESIUM: Magnesium: 1.9 mg/dL (ref 1.7–2.4)

## 2018-11-26 LAB — STREP PNEUMONIAE URINARY ANTIGEN: STREP PNEUMO URINARY ANTIGEN: NEGATIVE

## 2018-11-26 MED ORDER — SODIUM CHLORIDE 0.9 % IV SOLN
100.0000 mg | Freq: Two times a day (BID) | INTRAVENOUS | Status: DC
  Administered 2018-11-26 – 2018-11-28 (×5): 100 mg via INTRAVENOUS
  Filled 2018-11-26 (×8): qty 100

## 2018-11-26 NOTE — Progress Notes (Signed)
PROGRESS NOTE  [name] [name]  PYP:[id number]  DOB: 08-Mar-1961  DOA: 11/23/2018 PCP: Shirline Frees, MD   Brief Admission Hx: 57 y.o. male with medical history significant for HTN, CVA, CAD, COPD, NASH, PVD, PTSD, who presented to the ED with c/o 5 days of productive cough, shortness of breath.  He was admitted with severe sepsis and pneumonia.    MDM/Assessment & Plan:   1. Severe Sepsis - secondary to dense RLL pneumonia - Xray shows progression.  Will broaden coverage.  Continue azactam/vancomycin, added doxy for atypical coverage. Continue IV antibiotics and supportive care.   Follow closely.  Lactic acid is trending down.   Low threshold to move to higher level of care if his oxygen requirement increases.     2. Community acquired pneumonia - appears to be worsening on CXR.  See above antibiotic changes.  Follow closely.   Follow blood cultures.  Strep pneum and legionella testing is still pending.    Influenza testing was negative.  3. Pulmonary edema - seen on CXR likely from sepsis fluid resuscitation - IV lasix ordered, reduced IV fluids.  Lactate is down to normal range now.  4. Acute COPD exacerbation - secondary to pneumonia - Treating supportively.  Continue nebs, steroids, antibiotics, mucolytics.  5. Essential hypertension - slightly better controlled, resumed home blood pressure medications.    6. Hypokalemia-Repleted.     DVT prophylaxis: lovenox Code Status: Full  Family Communication: spoke with wife by telephone  Disposition Plan: Pt needs ongoing inpatient care with IV antibiotics, IV fluid resuscitation, intensive respiratory treatments  Consultants:  n/a  Procedures:  n/a  Antimicrobials:  Levofloxacin 11/26 >11/27  Aztreonam 11/28 >  Vancomycin 11/28 >  Doxycycline 11/29 >  Subjective: Pt says that he is having less chills but he remains oxygen dependent and very SOB with minimal ambulation.  No chest pain.  No palpitations.  Minimal sputum  production.    Objective: Vitals:   11/26/18 0030 11/26/18 0456 11/26/18 0538 11/26/18 1025  BP:   136/80   Pulse:   84   Resp:   17   Temp:   98.2 F (36.8 C)   TempSrc:   Oral   SpO2: 94% 90% 90% 90%  Weight:      Height:        Intake/Output Summary (Last 24 hours) at 11/26/2018 1200 Last data filed at 11/26/2018 0900 Gross per 24 hour  Intake 2490 ml  Output 6050 ml  Net -3560 ml   Filed Weights   11/23/18 1337  Weight: 99.8 kg    REVIEW OF SYSTEMS  As per history otherwise all reviewed and reported negative  Exam:  General exam: awake, alert, NAD cooperative, sitting up in bed.  Speaking full sentences.   Respiratory system: rhonchi RUL RLL, wheezes RLL RML, diffuse rales heard RLL, dimished BS right base. Mild increased work of breathing. Cardiovascular system: S1 & S2 heard, tachycardic. No JVD, murmurs, gallops, clicks or pedal edema. Gastrointestinal system: Abdomen is nondistended, soft and nontender. Normal bowel sounds heard. Central nervous system: Alert and oriented. No focal neurological deficits. Extremities: no CCE.  Data Reviewed: Basic Metabolic Panel: Recent Labs  Lab 11/22/18 0915 11/23/18 1422 11/24/18 0510 11/25/18 0449 11/26/18 0558  NA 136 134* 138 140 137  K 3.8 3.3* 3.5 3.9 4.0  CL 104 101 110 114* 107  CO2 22 22 19* 17* 20*  GLUCOSE 114* 111* 250* 204* 186*  BUN 13 24* 20 12 15   CREATININE  0.81 0.96 0.86 0.64 0.62  CALCIUM 9.2 9.1 8.7* 9.1 9.4  MG  --  1.4* 2.0 1.8 1.9   Liver Function Tests: Recent Labs  Lab 11/25/18 0449 11/26/18 0558  AST 32 32  ALT 29 35  ALKPHOS 71 79  BILITOT 0.6 1.1  PROT 6.5 6.9  ALBUMIN 3.2* 3.2*   No results for input(s): LIPASE, AMYLASE in the last 168 hours. No results for input(s): AMMONIA in the last 168 hours. CBC: Recent Labs  Lab 11/22/18 0915 11/23/18 1422 11/24/18 0510 11/25/18 0449 11/26/18 0558  WBC 10.8* 11.8* 9.8 12.6* 12.3*  NEUTROABS  --  9.2*  --  10.1* 9.4*  HGB  15.1 14.9 12.6* 12.7* 13.4  HCT 47.6 45.3 38.7* 40.1 40.5  MCV 90.0 88.5 90.2 91.8 88.8  PLT 152 126* 117* 149* 198   Cardiac Enzymes: Recent Labs  Lab 11/23/18 1759 11/23/18 2254  TROPONINI 0.04* <0.03   CBG (last 3)  Recent Labs    11/26/18 0226 11/26/18 0728 11/26/18 1104  GLUCAP 166* 149* 155*   Recent Results (from the past 240 hour(s))  Blood culture (routine x 2)     Status: None (Preliminary result)   Collection Time: 11/23/18  2:22 PM  Result Value Ref Range Status   Specimen Description BLOOD SITE NOT SPECIFIED DRAWN BY RN  Final   Special Requests   Final    BOTTLES DRAWN AEROBIC AND ANAEROBIC Blood Culture adequate volume   Culture   Final    NO GROWTH 3 DAYS Performed at [facility], [address], [place], [place] [address]    Report Status PENDING  Incomplete  Blood culture (routine x 2)     Status: None (Preliminary result)   Collection Time: 11/23/18  2:29 PM  Result Value Ref Range Status   Specimen Description BLOOD SITE NOT SPECIFIED  Final   Special Requests   Final    BOTTLES DRAWN AEROBIC AND ANAEROBIC Blood Culture results may not be optimal due to an inadequate volume of blood received in culture bottles   Culture   Final    NO GROWTH 3 DAYS Performed at [facility], [address], [place], [place] [address]    Report Status PENDING  Incomplete     Studies: Dg Chest Port 1 View  Result Date: 11/25/2018 CLINICAL DATA:  Sepsis. Shortness of breath. EXAM: PORTABLE CHEST 1 VIEW COMPARISON:  Frontal and lateral views 11/23/2018 FINDINGS: Development of bilateral perihilar consolidations since prior exam. Peripheral right lung base opacity is again seen. Apparent increased size cardiac silhouette likely accentuated by portable technique and low lung volumes. There is increased bronchial thickening from prior exam. No pleural effusion or pneumothorax. IMPRESSION: 1. New bilateral perihilar consolidations since radiograph 2 days ago, may reflect  pulmonary edema, aspiration, developing ARDS or multifocal pneumonia. Right basilar opacity is unchanged. 2. Increased bronchial thickening from prior exam. 3. Increased size of the cardiac silhouette over the course of 2 days, likely in part accentuated by portable technique. Electronically Signed   By: Keith Rake M.D.   On: 11/25/2018 04:39   Scheduled Meds: . acetaminophen  650 mg Oral Q6H   Or  . acetaminophen  650 mg Rectal Q6H  . allopurinol  100 mg Oral q morning - 10a  . amLODipine  10 mg Oral q morning - 10a  . aspirin  325 mg Oral q morning - 10a  . dextromethorphan-guaiFENesin  1 tablet Oral BID  . enoxaparin (LOVENOX) injection  40  mg Subcutaneous Q24H  . Influenza vac split quadrivalent PF  0.5 mL Intramuscular Tomorrow-1000  . insulin aspart  0-15 Units Subcutaneous TID WC  . insulin aspart  0-5 Units Subcutaneous QHS  . insulin aspart  3 Units Subcutaneous TID WC  . ipratropium-albuterol  3 mL Nebulization Q4H  . losartan  100 mg Oral Daily  . methylPREDNISolone (SOLU-MEDROL) injection  60 mg Intravenous Q12H  . metoprolol tartrate  100 mg Oral BID  . nicotine  14 mg Transdermal Daily  . pantoprazole  40 mg Oral BID  . traZODone  100 mg Oral QHS   Continuous Infusions: . 0.9 % NaCl with KCl 40 mEq / L 50 mL/hr (11/26/18 1470)  . aztreonam 2 g (11/26/18 1009)  . doxycycline (VIBRAMYCIN) IV 100 mg (11/26/18 0720)  . vancomycin 1,000 mg (11/26/18 0453)    Principal Problem:   Community acquired pneumonia of right middle lobe of lung ([place]) Active Problems:   GOUT   Essential hypertension   GERD   CAD (coronary artery disease)   History of CVA (cerebrovascular accident)   Sepsis ([place])   Steroid-induced hyperglycemia   NASH (nonalcoholic steatohepatitis)   COPD (chronic obstructive pulmonary disease) ([place])   Community acquired pneumonia  Time spent:   Irwin Brakeman, MD, FAAFP [facility] Pager [phone]  If 7PM-7AM, please contact  night-coverage [email or web address] Password [email or web address] 11/26/2018, 12:00 PM    LOS: 2 days

## 2018-11-27 DIAGNOSIS — J9601 Acute respiratory failure with hypoxia: Secondary | ICD-10-CM

## 2018-11-27 LAB — CBC WITH DIFFERENTIAL/PLATELET
ABS IMMATURE GRANULOCYTES: 0.91 10*3/uL — AB (ref 0.00–0.07)
BASOS PCT: 1 %
Basophils Absolute: 0.1 10*3/uL (ref 0.0–0.1)
Eosinophils Absolute: 0.1 10*3/uL (ref 0.0–0.5)
Eosinophils Relative: 1 %
HCT: 43 % (ref 39.0–52.0)
Hemoglobin: 13.9 g/dL (ref 13.0–17.0)
Immature Granulocytes: 7 %
Lymphocytes Relative: 20 %
Lymphs Abs: 2.5 10*3/uL (ref 0.7–4.0)
MCH: 29.3 pg (ref 26.0–34.0)
MCHC: 32.3 g/dL (ref 30.0–36.0)
MCV: 90.7 fL (ref 80.0–100.0)
Monocytes Absolute: 1.1 10*3/uL — ABNORMAL HIGH (ref 0.1–1.0)
Monocytes Relative: 8 %
Neutro Abs: 8 10*3/uL — ABNORMAL HIGH (ref 1.7–7.7)
Neutrophils Relative %: 63 %
Platelets: 233 10*3/uL (ref 150–400)
RBC: 4.74 MIL/uL (ref 4.22–5.81)
RDW: 13.1 % (ref 11.5–15.5)
WBC: 12.6 10*3/uL — AB (ref 4.0–10.5)
nRBC: 0.2 % (ref 0.0–0.2)

## 2018-11-27 LAB — COMPREHENSIVE METABOLIC PANEL
ALT: 44 U/L (ref 0–44)
AST: 32 U/L (ref 15–41)
Albumin: 3.1 g/dL — ABNORMAL LOW (ref 3.5–5.0)
Alkaline Phosphatase: 76 U/L (ref 38–126)
Anion gap: 8 (ref 5–15)
BUN: 23 mg/dL — ABNORMAL HIGH (ref 6–20)
CALCIUM: 9 mg/dL (ref 8.9–10.3)
CO2: 23 mmol/L (ref 22–32)
Chloride: 106 mmol/L (ref 98–111)
Creatinine, Ser: 0.69 mg/dL (ref 0.61–1.24)
GFR calc Af Amer: >60 mL/min (ref >60)
GFR calc non Af Amer: >60 mL/min (ref >60)
Glucose, Bld: 152 mg/dL — ABNORMAL HIGH (ref 70–99)
Potassium: 3.9 mmol/L (ref 3.5–5.1)
Sodium: 137 mmol/L (ref 135–145)
Total Bilirubin: 0.8 mg/dL (ref 0.3–1.2)
Total Protein: 6.7 g/dL (ref 6.5–8.1)

## 2018-11-27 LAB — GLUCOSE, CAPILLARY
Glucose-Capillary: 180 mg/dL — ABNORMAL HIGH (ref 70–99)
Glucose-Capillary: 131 mg/dL — ABNORMAL HIGH (ref 70–99)
Glucose-Capillary: 142 mg/dL — ABNORMAL HIGH (ref 70–99)
Glucose-Capillary: 108 mg/dL — ABNORMAL HIGH (ref 70–99)
Glucose-Capillary: 133 mg/dL — ABNORMAL HIGH (ref 70–99)

## 2018-11-27 LAB — VANCOMYCIN, TROUGH: Vancomycin Tr: 9 ug/mL — ABNORMAL LOW (ref 15–20)

## 2018-11-27 MED ORDER — VANCOMYCIN HCL IN DEXTROSE 1-5 GM/200ML-% IV SOLN
1000.0000 mg | Freq: Three times a day (TID) | INTRAVENOUS | Status: AC
  Administered 2018-11-27: 1000 mg via INTRAVENOUS

## 2018-11-27 MED ORDER — VANCOMYCIN HCL 10 G IV SOLR
1250.0000 mg | Freq: Three times a day (TID) | INTRAVENOUS | Status: DC
  Administered 2018-11-27 – 2018-11-28 (×3): 1250 mg via INTRAVENOUS
  Filled 2018-11-27 (×6): qty 1250

## 2018-11-27 NOTE — Progress Notes (Signed)
Patient appears better this am, he states he feels better. Increased his oxygen at beginning of shift to 7 lpm high flow cannula. He seemed to sleep well between midnight and 5 am. Hopefully can titrate oxygen down today. He still notes he gets SOB with much exertion.

## 2018-11-27 NOTE — Progress Notes (Signed)
Pharmacy Antibiotic Note   Today is day #3 of vancomycin and aztreonam and day #2 of doxycycline therapy for this 30 yom admitted for sepsis and  RLL pneumonia.   Blood cultures have shown no growth x4 days and urinary Strep Pneumo screen was negative. Patient is currently afebrile and WBC count has remained slightly elevated at around 12K.   Renal function appears stable at this time.  Vancomycin trough of 70mg/mL today is slightly below desired therapeutic goal range.   Plan: give last dose of vancomycin 1g today at 1200 and then start vancomycin 1.25g IV q8h  Goal vancomycin  trough range: 15-20  mcg/mL Pharmacy will continue to monitor renal function, vancomycin troughs as clinically appropriate, cultures and patient progress.   Height: 5' 8"  (172.7 cm) Weight: 220 lb (99.8 kg) IBW/kg (Calculated) : 68.4  Temp (24hrs), Avg:98.1 F (36.7 C), Min:98 F (36.7 C), Max:98.2 F (36.8 C)  Recent Labs  Lab 11/23/18 1422  11/24/18 0510 11/24/18 0831 11/24/18 1402 11/24/18 1947 11/24/18 2241 11/25/18 0449 11/26/18 0558 11/27/18 0627 11/27/18 1116  WBC 11.8*  --  9.8  --   --   --   --  12.6* 12.3* 12.6*  --   CREATININE 0.96  --  0.86  --   --   --   --  0.64 0.62 0.69  --   LATICACIDVEN  --    < > 2.6* 2.5* 2.5* 3.5* 1.8  --   --   --   --   VANCOTROUGH  --   --   --   --   --   --   --   --   --   --  9*   < > = values in this interval not displayed.    Estimated Creatinine Clearance: 115.3 mL/min (by C-G formula based on SCr of 0.69 mg/dL).     Antimicrobials this admission: Levofloxacin  11/27>>11/28  aztreonam 11/28   >>  vancomycin 11/28 >>      Microbiology results: 11/26 BCx: ng x4 days 11/28 Legionella [id number] S. Pneumo urinary ant: negative     Thank you for allowing pharmacy to participate in this  patient's care.  TDespina Pole11/30/2019 12:09 PM

## 2018-11-27 NOTE — Progress Notes (Signed)
Patient appears better, titration of oxygen is down to 5 lpm. No chest x-ray done since 28 ??  Albumin 3.1 , Kidney function with in normal range. Still decreased with exp wheezes. Have collected sputum for lab but has been rejected twice as too much spit. I/O appear normal range. Will continue to monitor.

## 2018-11-27 NOTE — Progress Notes (Signed)
PROGRESS NOTE  [name] [name]  XTG:[id number]  DOB: 01-15-1961  DOA: 11/23/2018 PCP: Shirline Frees, MD   Brief Admission Hx: 57 y.o. male with medical history significant for HTN, CVA, CAD, COPD, NASH, PVD, PTSD, who presented to the ED with c/o 5 days of productive cough, shortness of breath.  He was admitted with severe sepsis and pneumonia.    MDM/Assessment & Plan:   1. Severe Sepsis - secondary to dense RLL pneumonia - Xray shows progression.  Antibiotic coverage has been broadened.  Continue azactam/vancomycin, added doxy for atypical coverage. Continue IV antibiotics and supportive care.   Follow closely.  Lactic acid is trending down.   Low threshold to move to higher level of care if his oxygen requirement increases.     2. Community acquired pneumonia - appears to be worsening on CXR.  See above antibiotic changes.  Follow closely.   Blood cultures show no growth.  Strep pneum antigen negative.  Legionella antigen in process.  Influenza testing was negative.  We will repeat chest x-ray in the morning.  Will consult pulmonology for further input. 3. Acute respiratory failure with hypoxia.  Related to pneumonia and COPD.  Currently on 7.5 L of high flow nasal cannula.  Wean down as tolerated. 4. Pulmonary edema - seen on CXR likely from sepsis fluid resuscitation - IV lasix ordered, reduced IV fluids.  Lactate is down to normal range now.  5. Acute COPD exacerbation - secondary to pneumonia - Treating supportively.  Continue nebs, steroids, antibiotics, mucolytics.  6. Essential hypertension - slightly better controlled, resumed home blood pressure medications.    7. Hypokalemia-Repleted.     DVT prophylaxis: lovenox Code Status: Full  Family Communication: No family present Disposition Plan: Pt needs ongoing inpatient care with IV antibiotics, IV fluid resuscitation, intensive respiratory  treatments  Consultants:  n/a  Procedures:  n/a  Antimicrobials:  Levofloxacin 11/26 >11/27  Aztreonam 11/28 >  Vancomycin 11/28 >  Doxycycline 11/29 >  Subjective: Cough is minimally productive.  Still feels short of breath.  Becomes more short of breath when ambulating.  Objective: Vitals:   11/27/18 0841 11/27/18 1301 11/27/18 1406 11/27/18 1612  BP:   (!) 145/85   Pulse:   67   Resp:   20   Temp:   97.6 F (36.4 C)   TempSrc:   Oral   SpO2: 91% 96% 100% 96%  Weight:      Height:        Intake/Output Summary (Last 24 hours) at 11/27/2018 1727 Last data filed at 11/27/2018 1331 Gross per 24 hour  Intake 2623.87 ml  Output 2000 ml  Net 623.87 ml   Filed Weights   11/23/18 1337  Weight: 99.8 kg    REVIEW OF SYSTEMS  As per history otherwise all reviewed and reported negative  Exam:  General exam: Alert, awake, oriented x 3 Respiratory system: Scattered rhonchi bilaterally.  No wheezing. Cardiovascular system:RRR. No murmurs, rubs, gallops. Gastrointestinal system: Abdomen is nondistended, soft and nontender. No organomegaly or masses felt. Normal bowel sounds heard. Central nervous system: Alert and oriented. No focal neurological deficits. Extremities: No C/C/E, +pedal pulses Skin: No rashes, lesions or ulcers Psychiatry: Judgement and insight appear normal. Mood & affect appropriate.    Data Reviewed: Basic Metabolic Panel: Recent Labs  Lab 11/23/18 1422 11/24/18 0510 11/25/18 0449 11/26/18 0558 11/27/18 0627  NA 134* 138 140 137 137  K 3.3* 3.5 3.9 4.0 3.9  CL 101 110 114* 107 106  CO2 22 19* 17* 20* 23  GLUCOSE 111* 250* 204* 186* 152*  BUN 24* 20 12 15  23*  CREATININE 0.96 0.86 0.64 0.62 0.69  CALCIUM 9.1 8.7* 9.1 9.4 9.0  MG 1.4* 2.0 1.8 1.9  --    Liver Function Tests: Recent Labs  Lab 11/25/18 0449 11/26/18 0558 11/27/18 0627  AST 32 32 32  ALT 29 35 44  ALKPHOS 71 79 76  BILITOT 0.6 1.1 0.8  PROT 6.5 6.9 6.7   ALBUMIN 3.2* 3.2* 3.1*   No results for input(s): LIPASE, AMYLASE in the last 168 hours. No results for input(s): AMMONIA in the last 168 hours. CBC: Recent Labs  Lab 11/23/18 1422 11/24/18 0510 11/25/18 0449 11/26/18 0558 11/27/18 0627  WBC 11.8* 9.8 12.6* 12.3* 12.6*  NEUTROABS 9.2*  --  10.1* 9.4* 8.0*  HGB 14.9 12.6* 12.7* 13.4 13.9  HCT 45.3 38.7* 40.1 40.5 43.0  MCV 88.5 90.2 91.8 88.8 90.7  PLT 126* 117* 149* 198 233   Cardiac Enzymes: Recent Labs  Lab 11/23/18 1759 11/23/18 2254  TROPONINI 0.04* <0.03   CBG (last 3)  Recent Labs    11/27/18 0743 11/27/18 1128 11/27/18 1606  GLUCAP 131* 142* 108*   Recent Results (from the past 240 hour(s))  Blood culture (routine x 2)     Status: None (Preliminary result)   Collection Time: 11/23/18  2:22 PM  Result Value Ref Range Status   Specimen Description BLOOD SITE NOT SPECIFIED DRAWN BY RN  Final   Special Requests   Final    BOTTLES DRAWN AEROBIC AND ANAEROBIC Blood Culture adequate volume   Culture   Final    NO GROWTH 4 DAYS Performed at [facility], [address] [place], [place] [address]    Report Status PENDING  Incomplete  Blood culture (routine x 2)     Status: None (Preliminary result)   Collection Time: 11/23/18  2:29 PM  Result Value Ref Range Status   Specimen Description BLOOD SITE NOT SPECIFIED  Final   Special Requests   Final    BOTTLES DRAWN AEROBIC AND ANAEROBIC Blood Culture results may not be optimal due to an inadequate volume of blood received in culture bottles   Culture   Final    NO GROWTH 4 DAYS Performed at [facility], [address] [place], [place] [address]    Report Status PENDING  Incomplete     Studies: No results found. Scheduled Meds: . acetaminophen  650 mg Oral Q6H   Or  . acetaminophen  650 mg Rectal Q6H  . allopurinol  100 mg Oral q morning - 10a  . amLODipine  10 mg Oral q morning - 10a  . aspirin  325 mg Oral q morning - 10a  .  dextromethorphan-guaiFENesin  1 tablet Oral BID  . enoxaparin (LOVENOX) injection  40 mg Subcutaneous Q24H  . Influenza vac split quadrivalent PF  0.5 mL Intramuscular Tomorrow-1000  . insulin aspart  0-15 Units Subcutaneous TID WC  . insulin aspart  0-5 Units Subcutaneous QHS  . insulin aspart  3 Units Subcutaneous TID WC  . ipratropium-albuterol  3 mL Nebulization Q4H  . losartan  100 mg Oral Daily  . methylPREDNISolone (SOLU-MEDROL) injection  60 mg Intravenous Q12H  . metoprolol tartrate  100 mg Oral BID  . nicotine  14 mg Transdermal Daily  . pantoprazole  40 mg Oral BID  . traZODone  100 mg Oral QHS   Continuous Infusions: . aztreonam Stopped (  11/27/18 1311)  . doxycycline (VIBRAMYCIN) IV Stopped (11/27/18 0810)  . vancomycin      Principal Problem:   Community acquired pneumonia of right middle lobe of lung ([place]) Active Problems:   GOUT   Essential hypertension   GERD   CAD (coronary artery disease)   History of CVA (cerebrovascular accident)   Sepsis ([place])   Steroid-induced hyperglycemia   NASH (nonalcoholic steatohepatitis)   COPD (chronic obstructive pulmonary disease) ([place])   Community acquired pneumonia  Time spent: 70mns  JKathie Dike MD [facility] Pager [phone] If 7PM-7AM, please contact night-coverage [email or web address] Password [email or web address] 11/27/2018, 5:27 PM    LOS: 3 days

## 2018-11-28 ENCOUNTER — Inpatient Hospital Stay (HOSPITAL_COMMUNITY): Payer: Medicare Other

## 2018-11-28 LAB — COMPREHENSIVE METABOLIC PANEL
ALBUMIN: 3 g/dL — AB (ref 3.5–5.0)
ALT: 60 U/L — AB (ref 0–44)
AST: 42 U/L — ABNORMAL HIGH (ref 15–41)
Alkaline Phosphatase: 71 U/L (ref 38–126)
Anion gap: 8 (ref 5–15)
BUN: 22 mg/dL — ABNORMAL HIGH (ref 6–20)
CO2: 24 mmol/L (ref 22–32)
Calcium: 9 mg/dL (ref 8.9–10.3)
Chloride: 107 mmol/L (ref 98–111)
Creatinine, Ser: 0.66 mg/dL (ref 0.61–1.24)
GFR calc Af Amer: >60 mL/min (ref >60)
GFR calc non Af Amer: >60 mL/min (ref >60)
Glucose, Bld: 91 mg/dL (ref 70–99)
Potassium: 3.7 mmol/L (ref 3.5–5.1)
Sodium: 139 mmol/L (ref 135–145)
Total Bilirubin: 0.8 mg/dL (ref 0.3–1.2)
Total Protein: 6.5 g/dL (ref 6.5–8.1)

## 2018-11-28 LAB — CBC WITH DIFFERENTIAL/PLATELET
Abs Immature Granulocytes: 1.16 10*3/uL — ABNORMAL HIGH (ref 0.00–0.07)
Basophils Absolute: 0.1 10*3/uL (ref 0.0–0.1)
Basophils Relative: 1 %
Eosinophils Absolute: 0 10*3/uL (ref 0.0–0.5)
Eosinophils Relative: 0 %
HCT: 41.2 % (ref 39.0–52.0)
Hemoglobin: 13.3 g/dL (ref 13.0–17.0)
IMMATURE GRANULOCYTES: 10 %
LYMPHS PCT: 32 %
Lymphs Abs: 3.8 10*3/uL (ref 0.7–4.0)
MCH: 29.2 pg (ref 26.0–34.0)
MCHC: 32.3 g/dL (ref 30.0–36.0)
MCV: 90.5 fL (ref 80.0–100.0)
Monocytes Absolute: 0.8 10*3/uL (ref 0.1–1.0)
Monocytes Relative: 6 %
Neutro Abs: 6.3 10*3/uL (ref 1.7–7.7)
Neutrophils Relative %: 51 %
Platelets: 254 10*3/uL (ref 150–400)
RBC: 4.55 MIL/uL (ref 4.22–5.81)
RDW: 13 % (ref 11.5–15.5)
WBC: 12.1 10*3/uL — ABNORMAL HIGH (ref 4.0–10.5)
nRBC: 0.3 % — ABNORMAL HIGH (ref 0.0–0.2)

## 2018-11-28 LAB — GLUCOSE, CAPILLARY
GLUCOSE-CAPILLARY: 128 mg/dL — AB (ref 70–99)
Glucose-Capillary: 118 mg/dL — ABNORMAL HIGH (ref 70–99)
Glucose-Capillary: 95 mg/dL (ref 70–99)
Glucose-Capillary: 127 mg/dL — ABNORMAL HIGH (ref 70–99)
Glucose-Capillary: 147 mg/dL — ABNORMAL HIGH (ref 70–99)

## 2018-11-28 LAB — EXPECTORATED SPUTUM ASSESSMENT W GRAM STAIN, RFLX TO RESP C

## 2018-11-28 LAB — CULTURE, BLOOD (ROUTINE X 2)
Culture: NO GROWTH
Culture: NO GROWTH
Special Requests: ADEQUATE

## 2018-11-28 LAB — LEGIONELLA PNEUMOPHILA SEROGP 1 UR AG: L. pneumophila Serogp 1 Ur Ag: NEGATIVE

## 2018-11-28 LAB — MRSA PCR SCREENING: MRSA by PCR: NEGATIVE

## 2018-11-28 MED ORDER — DOXYCYCLINE HYCLATE 100 MG PO TABS
100.0000 mg | ORAL_TABLET | Freq: Two times a day (BID) | ORAL | Status: DC
  Administered 2018-11-28 – 2018-11-29 (×2): 100 mg via ORAL
  Filled 2018-11-28 (×2): qty 1

## 2018-11-28 NOTE — Progress Notes (Signed)
PHARMACIST - PHYSICIAN COMMUNICATION DR:   Roderic Palau CONCERNING: Antibiotic- IV- to Oral Route Change Policy  RECOMMENDATION: This patient is receiving  doxycycline by the intravenous route.  Based on criteria approved by the Pharmacy and Therapeutics Committee, the antibiotic(s) is/are being converted to the equivalent oral dose form(s).   DESCRIPTION: These criteria include:  Patient being treated for a respiratory tract infection, urinary tract infection, cellulitis or clostridium difficile associated diarrhea if on metronidazole  The patient is not neutropenic and does not exhibit a GI malabsorption state  The patient is eating (either orally or via tube) and/or has been taking other orally administered medications for a least 24 hours  The patient is improving clinically and has a Tmax < 100.5  If you have questions about this conversion, please contact the Pharmacy Department  [x]   [phone] )  [name] [name] []   [phone] )  [name] [name]  []   [phone] )  [facility] []   [phone] )  [facility]

## 2018-11-28 NOTE — Consult Note (Signed)
Discussed with Dr. Roderic Palau. hold on consult for now .

## 2018-11-28 NOTE — Progress Notes (Signed)
Patient titrated to 4 lpm oxygen , finally able to get sputum for lab. Patient slept well.

## 2018-11-28 NOTE — Progress Notes (Signed)
PROGRESS NOTE  [name] [name]  AVW:[id number]  DOB: 11/25/1961  DOA: 11/23/2018 PCP: Shirline Frees, MD   Brief Admission Hx: 57 y.o. male with medical history significant for HTN, CVA, CAD, COPD, NASH, PVD, PTSD, who presented to the ED with c/o 5 days of productive cough, shortness of breath.  He was admitted with severe sepsis and pneumonia.    MDM/Assessment & Plan:   1. Severe Sepsis - secondary to dense RLL pneumonia -initially, serial x-rays had showed progression of pneumonia with concern for ARDS.  Antibiotic coverage was initially Levaquin, but this was changed to vancomycin, aztreonam and doxycycline.  Clinically, the patient appears to be improving.  Follow-up chest x-ray this morning shows improvement of pneumonia.  Case was reviewed with Dr. Luan Pulling as were images.  Since patient is clinically improving, will continue current treatments for now.  If patient's condition begins a decline, can consider formal pulmonology consult.  Follow closely.  Lactic acid is trending down.       2. Community acquired pneumonia -currently on broad-spectrum antibiotics.  Follow closely.   Blood cultures show no growth.  Strep pneumo and Legionella antigens negative.  Influenza testing was negative.  Repeat chest x-ray from this morning shows improvement, and clinically his shortness of breath is better.  MRSA PCR is negative.  Will discontinue vancomycin. 3. Acute respiratory failure with hypoxia.  Related to pneumonia and COPD.  He was requiring high flow nasal cannula 7.5 L.  This is currently been weaned down to 4 L.  Continue to wean down as tolerated. 4. Acute COPD exacerbation - secondary to pneumonia - Treating supportively.  Continue nebs, steroids, antibiotics, mucolytics.  5. Essential hypertension - slightly better controlled, resumed home blood pressure medications.    6. Hypokalemia-Repleted.     DVT prophylaxis: lovenox Code Status: Full  Family Communication: No family  present Disposition Plan: Pt needs ongoing inpatient care with IV antibiotics, IV fluid resuscitation, intensive respiratory treatments  Consultants:  n/a  Procedures:  n/a  Antimicrobials:  Levofloxacin 11/26 >11/27  Aztreonam 11/28 >  Vancomycin 11/28 > 12/1  Doxycycline 11/29 >  Subjective: Overall he is feeling better today.  Shortness of breath is better today.  Continues to have some cough which is minimally productive.  Objective: Vitals:   11/28/18 1312 11/28/18 1326 11/28/18 1704 11/28/18 1728  BP:  137/79    Pulse:  65 65   Resp:  20    Temp:  (!) 97.5 F (36.4 C)    TempSrc:  Oral    SpO2: 95% 98% 95% 96%  Weight:      Height:        Intake/Output Summary (Last 24 hours) at 11/28/2018 1738 Last data filed at 11/28/2018 1524 Gross per 24 hour  Intake 1176.19 ml  Output 4300 ml  Net -3123.81 ml   Filed Weights   11/23/18 1337  Weight: 99.8 kg    REVIEW OF SYSTEMS  As per history otherwise all reviewed and reported negative  Exam:  General exam: Alert, awake, oriented x 3 Respiratory system: Clear to auscultation. Respiratory effort normal. Cardiovascular system:RRR. No murmurs, rubs, gallops. Gastrointestinal system: Abdomen is nondistended, soft and nontender. No organomegaly or masses felt. Normal bowel sounds heard. Central nervous system: Alert and oriented. No focal neurological deficits. Extremities: No C/C/E, +pedal pulses Skin: No rashes, lesions or ulcers Psychiatry: Judgement and insight appear normal. Mood & affect appropriate.    Data Reviewed: Basic Metabolic Panel: Recent Labs  Lab 11/23/18 1422  11/24/18 0510 11/25/18 0449 11/26/18 0558 11/27/18 0627 11/28/18 0642  NA 134* 138 140 137 137 139  K 3.3* 3.5 3.9 4.0 3.9 3.7  CL 101 110 114* 107 106 107  CO2 22 19* 17* 20* 23 24  GLUCOSE 111* 250* 204* 186* 152* 91  BUN 24* 20 12 15  23* 22*  CREATININE 0.96 0.86 0.64 0.62 0.69 0.66  CALCIUM 9.1 8.7* 9.1 9.4 9.0 9.0   MG 1.4* 2.0 1.8 1.9  --   --    Liver Function Tests: Recent Labs  Lab 11/25/18 0449 11/26/18 0558 11/27/18 0627 11/28/18 0642  AST 32 32 32 42*  ALT 29 35 44 60*  ALKPHOS 71 79 76 71  BILITOT 0.6 1.1 0.8 0.8  PROT 6.5 6.9 6.7 6.5  ALBUMIN 3.2* 3.2* 3.1* 3.0*   No results for input(s): LIPASE, AMYLASE in the last 168 hours. No results for input(s): AMMONIA in the last 168 hours. CBC: Recent Labs  Lab 11/23/18 1422 11/24/18 0510 11/25/18 0449 11/26/18 0558 11/27/18 0627 11/28/18 0642  WBC 11.8* 9.8 12.6* 12.3* 12.6* 12.1*  NEUTROABS 9.2*  --  10.1* 9.4* 8.0* 6.3  HGB 14.9 12.6* 12.7* 13.4 13.9 13.3  HCT 45.3 38.7* 40.1 40.5 43.0 41.2  MCV 88.5 90.2 91.8 88.8 90.7 90.5  PLT 126* 117* 149* 198 233 254   Cardiac Enzymes: Recent Labs  Lab 11/23/18 1759 11/23/18 2254  TROPONINI 0.04* <0.03   CBG (last 3)  Recent Labs    11/28/18 0721 11/28/18 1110 11/28/18 1610  GLUCAP 95 127* 128*   Recent Results (from the past 240 hour(s))  Blood culture (routine x 2)     Status: None   Collection Time: 11/23/18  2:22 PM  Result Value Ref Range Status   Specimen Description BLOOD SITE NOT SPECIFIED DRAWN BY RN  Final   Special Requests   Final    BOTTLES DRAWN AEROBIC AND ANAEROBIC Blood Culture adequate volume   Culture   Final    NO GROWTH 5 DAYS Performed at [facility], [address], [place], [place] [address]    Report Status 11/28/2018 FINAL  Final  Blood culture (routine x 2)     Status: None   Collection Time: 11/23/18  2:29 PM  Result Value Ref Range Status   Specimen Description BLOOD SITE NOT SPECIFIED  Final   Special Requests   Final    BOTTLES DRAWN AEROBIC AND ANAEROBIC Blood Culture results may not be optimal due to an inadequate volume of blood received in culture bottles   Culture   Final    NO GROWTH 5 DAYS Performed at [facility], [address] [place], [place] [address]    Report Status 11/28/2018 FINAL  Final  MRSA PCR Screening      Status: None   Collection Time: 11/27/18  5:23 PM  Result Value Ref Range Status   MRSA by PCR NEGATIVE NEGATIVE Final    Comment:        The GeneXpert MRSA Assay (FDA approved for NASAL specimens only), is one component of a comprehensive MRSA colonization surveillance program. It is not intended to diagnose MRSA infection nor to guide or monitor treatment for MRSA infections. Performed at [facility], [address] [place], [place] [address]   Expectorated sputum assessment w rflx to resp cult     Status: None   Collection Time: 11/28/18  5:29 AM  Result Value Ref Range Status   Specimen Description EXPECTORATED SPUTUM  Final  Special Requests NONE  Final   Sputum evaluation   Final    THIS SPECIMEN IS ACCEPTABLE FOR SPUTUM CULTURE Performed at [facility], [address], [place], [place] [address]    Report Status 11/28/2018 FINAL  Final  Culture, respiratory     Status: None (Preliminary result)   Collection Time: 11/28/18  5:29 AM  Result Value Ref Range Status   Specimen Description   Final    EXPECTORATED SPUTUM Performed at [facility], [address] [place], [place] [address]    Special Requests   Final    NONE Reflexed from [id number] Performed at [facility], [address] [place], [place] [address]    Gram Stain   Final    RARE WBC PRESENT, PREDOMINANTLY PMN MODERATE GRAM POSITIVE COCCI FEW GRAM NEGATIVE RODS Performed at [facility], [address], [place], [place] [address]    Culture PENDING  Incomplete   Report Status PENDING  Incomplete     Studies: Dg Chest Port 1 View  Result Date: 11/28/2018 CLINICAL DATA:  Hypoxia.  Acute respiratory failure. EXAM: PORTABLE CHEST 1 VIEW COMPARISON:  11/25/2018 FINDINGS: Stable cardiac enlargement. No pleural effusions identified. Diffuse bilateral airspace opacities are identified compatible with mild ARDS. Not significantly improved from previous exam. IMPRESSION: 1. No significant change in aeration  a lungs compared with previous exam. Electronically Signed   By: Kerby Moors M.D.   On: 11/28/2018 10:35   Scheduled Meds: . acetaminophen  650 mg Oral Q6H   Or  . acetaminophen  650 mg Rectal Q6H  . allopurinol  100 mg Oral q morning - 10a  . amLODipine  10 mg Oral q morning - 10a  . aspirin  325 mg Oral q morning - 10a  . dextromethorphan-guaiFENesin  1 tablet Oral BID  . doxycycline  100 mg Oral Q12H  . enoxaparin (LOVENOX) injection  40 mg Subcutaneous Q24H  . Influenza vac split quadrivalent PF  0.5 mL Intramuscular Tomorrow-1000  . insulin aspart  0-15 Units Subcutaneous TID WC  . insulin aspart  0-5 Units Subcutaneous QHS  . insulin aspart  3 Units Subcutaneous TID WC  . ipratropium-albuterol  3 mL Nebulization Q4H  . losartan  100 mg Oral Daily  . methylPREDNISolone (SOLU-MEDROL) injection  60 mg Intravenous Q12H  . metoprolol tartrate  100 mg Oral BID  . nicotine  14 mg Transdermal Daily  . pantoprazole  40 mg Oral BID  . traZODone  100 mg Oral QHS   Continuous Infusions: . aztreonam 2 g (11/28/18 1700)    Principal Problem:   Community acquired pneumonia of right middle lobe of lung ([place]) Active Problems:   GOUT   Essential hypertension   GERD   CAD (coronary artery disease)   History of CVA (cerebrovascular accident)   Sepsis ([place])   Steroid-induced hyperglycemia   NASH (nonalcoholic steatohepatitis)   COPD (chronic obstructive pulmonary disease) ([place])   Community acquired pneumonia  Time spent: 32mns  JKathie Dike MD [facility] Pager [phone] If 7PM-7AM, please contact night-coverage [email or web address] Password [email or web address] 11/28/2018, 5:38 PM    LOS: 4 days

## 2018-11-29 DIAGNOSIS — Z8673 Personal history of transient ischemic attack (TIA), and cerebral infarction without residual deficits: Secondary | ICD-10-CM

## 2018-11-29 DIAGNOSIS — K7581 Nonalcoholic steatohepatitis (NASH): Secondary | ICD-10-CM

## 2018-11-29 DIAGNOSIS — T380X5A Adverse effect of glucocorticoids and synthetic analogues, initial encounter: Secondary | ICD-10-CM

## 2018-11-29 DIAGNOSIS — I10 Essential (primary) hypertension: Secondary | ICD-10-CM

## 2018-11-29 DIAGNOSIS — R739 Hyperglycemia, unspecified: Secondary | ICD-10-CM

## 2018-11-29 LAB — CBC WITH DIFFERENTIAL/PLATELET
Abs Immature Granulocytes: 0.79 10*3/uL — ABNORMAL HIGH (ref 0.00–0.07)
Basophils Absolute: 0 10*3/uL (ref 0.0–0.1)
Basophils Relative: 0 %
Eosinophils Absolute: 0 10*3/uL (ref 0.0–0.5)
Eosinophils Relative: 0 %
HEMATOCRIT: 41.2 % (ref 39.0–52.0)
Hemoglobin: 13.5 g/dL (ref 13.0–17.0)
Immature Granulocytes: 7 %
Lymphocytes Relative: 30 %
Lymphs Abs: 3.2 10*3/uL (ref 0.7–4.0)
MCH: 29.7 pg (ref 26.0–34.0)
MCHC: 32.8 g/dL (ref 30.0–36.0)
MCV: 90.7 fL (ref 80.0–100.0)
Monocytes Absolute: 0.6 10*3/uL (ref 0.1–1.0)
Monocytes Relative: 6 %
Neutro Abs: 6.1 10*3/uL (ref 1.7–7.7)
Neutrophils Relative %: 57 %
Platelets: 255 10*3/uL (ref 150–400)
RBC: 4.54 MIL/uL (ref 4.22–5.81)
RDW: 12.6 % (ref 11.5–15.5)
WBC: 10.8 10*3/uL — ABNORMAL HIGH (ref 4.0–10.5)
nRBC: 0 % (ref 0.0–0.2)

## 2018-11-29 LAB — BASIC METABOLIC PANEL
Anion gap: 8 (ref 5–15)
BUN: 21 mg/dL — ABNORMAL HIGH (ref 6–20)
CO2: 26 mmol/L (ref 22–32)
Calcium: 8.8 mg/dL — ABNORMAL LOW (ref 8.9–10.3)
Chloride: 106 mmol/L (ref 98–111)
Creatinine, Ser: 0.67 mg/dL (ref 0.61–1.24)
GFR calc Af Amer: >60 mL/min (ref >60)
GFR calc non Af Amer: >60 mL/min (ref >60)
Glucose, Bld: 103 mg/dL — ABNORMAL HIGH (ref 70–99)
Potassium: 3.6 mmol/L (ref 3.5–5.1)
Sodium: 140 mmol/L (ref 135–145)

## 2018-11-29 LAB — GLUCOSE, CAPILLARY
Glucose-Capillary: 95 mg/dL (ref 70–99)
Glucose-Capillary: 142 mg/dL — ABNORMAL HIGH (ref 70–99)

## 2018-11-29 MED ORDER — DM-GUAIFENESIN ER 30-600 MG PO TB12: 1.0000 | ORAL_TABLET | Freq: Two times a day (BID) | ORAL | 0 refills | Status: AC

## 2018-11-29 MED ORDER — METHYLPREDNISOLONE SODIUM SUCC 125 MG IJ SOLR: 60.0000 mg | INTRAMUSCULAR | Status: DC

## 2018-11-29 MED ORDER — PREDNISONE 20 MG PO TABS: ORAL_TABLET | ORAL | 0 refills | Status: DC

## 2018-11-29 MED ORDER — DOXYCYCLINE HYCLATE 100 MG PO TABS: 100.0000 mg | ORAL_TABLET | Freq: Two times a day (BID) | ORAL | 0 refills | Status: AC

## 2018-11-29 MED ORDER — IPRATROPIUM-ALBUTEROL 0.5-2.5 (3) MG/3ML IN SOLN: 3.0000 mL | Freq: Three times a day (TID) | RESPIRATORY_TRACT | 0 refills | Status: AC | PRN

## 2018-11-29 NOTE — Care Management Important Message (Signed)
Important Message  Patient Details  Name: [name] [name] MRN: [id number] Date of Birth: December 10, 1961   Medicare Important Message Given:  Yes    Shelda Altes 11/29/2018, 10:35 AM

## 2018-11-29 NOTE — Progress Notes (Signed)
Patient states understanding of discharge instructions.  

## 2018-11-29 NOTE — Discharge Instructions (Signed)
°  Follow with Primary MD  Shirline Frees, MD  and other consultants as instructed your Hospitalist MD  Please get a complete blood count and chemistry panel checked by your Primary MD at your next visit, and again as instructed by your Primary MD.  Get Medicines reviewed and adjusted: Please take all your medications with you for your next visit with your Primary MD  Laboratory/radiological data: Please request your Primary MD to go over all hospital tests and procedure/radiological results at the follow up, please ask your Primary MD to get all Hospital records sent to his/her office.  In some cases, they will be blood work, cultures and biopsy results pending at the time of your discharge. Please request that your primary care M.D. follows up on these results.  Also Note the following: If you experience worsening of your admission symptoms, develop shortness of breath, life threatening emergency, suicidal or homicidal thoughts you must seek medical attention immediately by calling 911 or calling your MD immediately  if symptoms less severe.  You must read complete instructions/literature along with all the possible adverse reactions/side effects for all the Medicines you take and that have been prescribed to you. Take any new Medicines after you have completely understood and accpet all the possible adverse reactions/side effects.   Do not drive when taking Pain medications or sleeping medications (Benzodaizepines)  Do not take more than prescribed Pain, Sleep and Anxiety Medications. It is not advisable to combine anxiety,sleep and pain medications without talking with your primary care practitioner  Special Instructions: If you have smoked or chewed Tobacco  in the last 2 yrs please stop smoking, stop any regular Alcohol  and or any Recreational drug use.  Wear Seat belts while driving.  Please note: You were cared for by a hospitalist during your hospital stay. Once you are  discharged, your primary care physician will handle any further medical issues. Please note that NO REFILLS for any discharge medications will be authorized once you are discharged, as it is imperative that you return to your primary care physician (or establish a relationship with a primary care physician if you do not have one) for your post hospital discharge needs so that they can reassess your need for medications and monitor your lab values.

## 2018-11-29 NOTE — Care Management Note (Signed)
Case Management Note  Patient Details  Name: [name] [name] MRN: [id number] Date of Birth: January 07, 1961  Subjective/Objective:   Admitted with pneumonia. Pt from home, has family support. Pt ind with ADL's. Has insurance and PCP. Has transportation to appointments. Pt has had 1 ED visit and 1 admission in past 6 months. Pt communicates no needs or concerns about DC plan.                  Action/Plan: DC home today with self care. Pt needs neb machine. Pt has no preference of DME provider after list review. Referral sent to [facility] who will deliver neb machine to pt room prior to DC.  Expected Discharge Date:     11/29/18             Expected Discharge Plan:  Home/Self Care  In-House Referral:  NA  Discharge planning Services  CM Consult  Post Acute Care Choice:  Durable Medical Equipment Choice offered to:  Patient  DME Arranged:  Nebulizer machine DME Agency:  [place].  Status of Service:  Completed, signed off  Sherald Barge, RN 11/29/2018, 11:06 AM

## 2018-11-29 NOTE — Discharge Summary (Signed)
Physician Discharge Summary  IREOLUWA GRANT KKX:[id number] DOB: Sep 01, 1961 DOA: 11/23/2018  PCP: Shirline Frees, MD  Admit date: 11/23/2018 Discharge date: 11/29/2018  Admitted From: Home  Disposition: Home   Recommendations for Outpatient Follow-up:  1. Follow up with PCP in 1 weeks for recheck.   Home Health: DME home nebulizer  Discharge Condition: STABLE   CODE STATUS: FULL    Brief Hospitalization Summary: Please see all hospital notes, images, labs for full details of the hospitalization. HPI: BRADIE LACOCK is a 57 y.o. male with medical history significant for HTN, CVA, CAD, COPD, NASH, PVD, PTSD, who presented to the ED with c/o 5 days of productive cough, shortness of breath.  Today he notes some blood flecks in his sputum.  Reports subjective fevers and chills, with sweats.  Patient was at [facility] ED yesterday thought to have COPD exacerbation chest x-ray was clear patient was discharged home as his cramps improved with the steroids and bronchodilators. Today his symptoms felt worse.  He reports sharp anterior chest pain right lateral wall chest pain present only with coughing lasting a few seconds.  ED Course: Tachycardic to 114, blood pressure 104/59 respiratory rate 23.  O2 sats greater than 94% on room air.  WBC- 11.8.  Lactic acid WNL 1.79.  K- 3.3 Two-view chest x-ray right base pneumonia.  EKG tachycardic 113, old RBBB.  Patient started on IV Levaquin, hospitalist to admit for community-acquired pneumonia.   Brief Admission Hx: 57 y.o.malewith medical history significantfor HTN, CVA, CAD, COPD, NASH, PVD, PTSD, who presented to the ED with c/o 5 days ofproductive cough, shortness of breath.  He was admitted with severe sepsis and pneumonia.    MDM/Assessment & Plan:   1. Severe Sepsis - RESOLVED.  Secondary to dense RLL pneumonia -initially, serial x-rays had showed progression of pneumonia with concern for ARDS.  Antibiotic coverage was initially  Levaquin, but this was changed to vancomycin, aztreonam and doxycycline.  Clinically, the patient appears to be improving.  Follow-up chest x-ray this morning shows improvement of pneumonia.  Case was reviewed with pulmonologist Dr. Luan Pulling as were images.  Since patient is clinically improving, will continue current treatments.       2. Community acquired pneumonia -Initially started on  on broad-spectrum antibiotics.  Follow closely.   Blood cultures show no growth.  Strep pneumo and Legionella antigens negative.  Influenza testing was negative.  Repeat chest x-ray shows improvement, and clinically his shortness of breath is better.  MRSA PCR is negative.  Discharge home on oral doxycycline to complete course.  3. Acute respiratory failure with hypoxia.  Resolved now.  Pt off all oxygen.  Related to pneumonia and COPD.  He was requiring high flow nasal cannula 7.5 L.  This is currently been weaned to room air.   4. Acute COPD exacerbation - secondary to pneumonia - Treating supportively.  Continue nebs, steroids, antibiotics, mucolytics.  Follow up with PCP for recheck.  5. Essential hypertension - slightly better controlled, resumed home blood pressure medications.    6. Hypokalemia-Repleted.     DVT prophylaxis: lovenox Code Status: Full  Family Communication: wife by telephone Disposition Plan: Home  Consultants:  n/a  Procedures:  n/a  Antimicrobials:  Levofloxacin 11/26 >11/27  Aztreonam 11/28 >  Vancomycin 11/28 > 12/1  Doxycycline 11/29 >  Discharge Diagnoses:  Principal Problem:   Community acquired pneumonia of right middle lobe of lung ([place]) Active Problems:   GOUT   Essential hypertension  GERD   CAD (coronary artery disease)   History of CVA (cerebrovascular accident)   Sepsis ([place])   Steroid-induced hyperglycemia   NASH (nonalcoholic steatohepatitis)   COPD (chronic obstructive pulmonary disease) ([place])   Community acquired pneumonia  Discharge  Instructions: Discharge Instructions    Call MD for:  difficulty breathing, headache or visual disturbances   Complete by:  As directed    Call MD for:  extreme fatigue   Complete by:  As directed    Call MD for:  persistant nausea and vomiting   Complete by:  As directed    Call MD for:  severe uncontrolled pain   Complete by:  As directed    Increase activity slowly   Complete by:  As directed       Follow-up Information    Shirline Frees, MD. Schedule an appointment as soon as possible for a visit in 1 week(s).   Specialty:  Family Medicine Why:  Hospital Follow Up  Contact information: [place] [address] [phone]           Allergies as of 11/29/2018      Reactions   Penicillins Hives, Rash         Medication List    STOP taking these medications   naproxen 500 MG tablet Commonly known as:  NAPROSYN     TAKE these medications   albuterol 108 (90 Base) MCG/ACT inhaler Commonly known as:  PROVENTIL HFA;VENTOLIN HFA Inhale into the lungs every 6 (six) hours as needed for wheezing or shortness of breath.   allopurinol 100 MG tablet Commonly known as:  ZYLOPRIM Take 100 mg by mouth every morning.   amLODipine 10 MG tablet Commonly known as:  NORVASC Take 10 mg by mouth every morning.   aspirin 325 MG tablet Take 325 mg by mouth every morning.   dextromethorphan-guaiFENesin 30-600 MG 12hr tablet Commonly known as:  MUCINEX DM Take 1 tablet by mouth 2 (two) times daily for 5 days.   doxycycline 100 MG tablet Commonly known as:  VIBRA-TABS Take 1 tablet (100 mg total) by mouth every 12 (twelve) hours for 7 days.   esomeprazole 40 MG capsule Commonly known as:  NEXIUM Take 40 mg by mouth 2 (two) times daily.   Fish Oil 1200 MG Cpdr Take 1,200 mg by mouth daily.   ipratropium-albuterol 0.5-2.5 (3) MG/3ML Soln Commonly known as:  DUONEB Take 3 mLs by nebulization every 8 (eight) hours as needed (wheezing, shortness of  breath). What changed:  reasons to take this   losartan 100 MG tablet Commonly known as:  COZAAR Take 100 mg by mouth every morning.   metoprolol tartrate 100 MG tablet Commonly known as:  LOPRESSOR Take 100 mg by mouth 2 (two) times daily.   Milk Thistle 500 MG Caps Take 1,000 mg by mouth daily.   multivitamin with minerals Tabs tablet Take 1 tablet by mouth daily.   niacin 1000 MG CR tablet Commonly known as:  NIASPAN Take 1,000 mg by mouth at bedtime.   nitroGLYCERIN 0.4 MG SL tablet Commonly known as:  NITROSTAT Place 0.4 mg under the tongue every 5 (five) minutes as needed. For chest pain   predniSONE 20 MG tablet Commonly known as:  DELTASONE Take 3 PO QAM x3days, 2 PO QAM x3days, 1 PO QAM x3days Start taking on:  11/30/2018 What changed:    medication strength  how much to take  how to take this  when to  take this  additional instructions   traZODone 100 MG tablet Commonly known as:  DESYREL Take 100 mg by mouth at bedtime.            Durable Medical Equipment  (From admission, onward)         Start     Ordered   11/29/18 1117  For home use only DME Nebulizer machine  Once    Question Answer Comment  Patient needs a nebulizer to treat with the following condition Acute respiratory failure ([place])   Patient needs a nebulizer to treat with the following condition COPD with acute exacerbation ([place])      11/29/18 1116          Procedures/Studies: Dg Chest 2 View  Result Date: 11/23/2018 CLINICAL DATA:  Chest congestion, shortness of breath EXAM: CHEST - 2 VIEW COMPARISON:  11/23/2015 FINDINGS: Cardiac shadow is stable. Lungs are well aerated bilaterally. New right basilar infiltrate is noted laterally. This was not visualized on the prior exam and projects primarily in the right middle lobe. No sizable effusion is seen. No bony abnormality is noted. IMPRESSION: New right basilar pneumonia. Electronically Signed   By: Inez Catalina M.D.   On:  11/23/2018 14:19   Dg Chest 2 View  Result Date: 11/22/2018 CLINICAL DATA:  Shortness of breath, cold symptoms EXAM: CHEST - 2 VIEW COMPARISON:  10/29/2017 FINDINGS: Minimal enlargement of cardiac silhouette. Mediastinal contours and pulmonary vascularity normal. Peribronchial thickening and slight accentuation of perihilar markings likely reflect bronchitis. No acute infiltrate, pleural effusion or pneumothorax. Bones unremarkable. IMPRESSION: Bronchitic changes without infiltrate. Minimal enlargement of cardiac silhouette. Electronically Signed   By: Lavonia Dana M.D.   On: 11/22/2018 09:52   Dg Chest Port 1 View  Result Date: 11/28/2018 CLINICAL DATA:  Hypoxia.  Acute respiratory failure. EXAM: PORTABLE CHEST 1 VIEW COMPARISON:  11/25/2018 FINDINGS: Stable cardiac enlargement. No pleural effusions identified. Diffuse bilateral airspace opacities are identified compatible with mild ARDS. Not significantly improved from previous exam. IMPRESSION: 1. No significant change in aeration a lungs compared with previous exam. Electronically Signed   By: Kerby Moors M.D.   On: 11/28/2018 10:35   Dg Chest Port 1 View  Result Date: 11/25/2018 CLINICAL DATA:  Sepsis. Shortness of breath. EXAM: PORTABLE CHEST 1 VIEW COMPARISON:  Frontal and lateral views 11/23/2018 FINDINGS: Development of bilateral perihilar consolidations since prior exam. Peripheral right lung base opacity is again seen. Apparent increased size cardiac silhouette likely accentuated by portable technique and low lung volumes. There is increased bronchial thickening from prior exam. No pleural effusion or pneumothorax. IMPRESSION: 1. New bilateral perihilar consolidations since radiograph 2 days ago, may reflect pulmonary edema, aspiration, developing ARDS or multifocal pneumonia. Right basilar opacity is unchanged. 2. Increased bronchial thickening from prior exam. 3. Increased size of the cardiac silhouette over the course of 2 days, likely  in part accentuated by portable technique. Electronically Signed   By: Keith Rake M.D.   On: 11/25/2018 04:39      Subjective: Pt reports that he feels much better and wants to go home today.  No SOB with ambulation, nebs help with wheezing. No chest pain.   Discharge Exam: Vitals:   11/29/18 0517 11/29/18 0723  BP: (!) 173/97   Pulse: (!) 58   Resp: 18   Temp: 99 F (37.2 C)   SpO2: 93% 91%   Vitals:   11/28/18 2129 11/28/18 2311 11/29/18 0517 11/29/18 0723  BP: (!) 148/86  (!) 173/97  Pulse: 63  (!) 58   Resp: 18  18   Temp: 98.5 F (36.9 C)  99 F (37.2 C)   TempSrc: Oral  Oral   SpO2: 95% 98% 93% 91%  Weight:      Height:       General exam: Alert, awake, oriented x 3.  Respiratory system: rales RLL much improved.  Respiratory effort normal. Cardiovascular system: normal s1,s2 sounds. No murmurs, rubs, gallops. Gastrointestinal system: Abdomen is nondistended, soft and nontender. No organomegaly or masses felt. Normal bowel sounds heard. Central nervous system: Alert and oriented. No focal neurological deficits. Extremities: No C/C/E, +pedal pulses.  Skin: No rashes, lesions or ulcers.  Psychiatry: Judgement and insight appear normal. Mood & affect appropriate.    The results of significant diagnostics from this hospitalization (including imaging, microbiology, ancillary and laboratory) are listed below for reference.     Microbiology: Recent Results (from the past 240 hour(s))  Blood culture (routine x 2)     Status: None   Collection Time: 11/23/18  2:22 PM  Result Value Ref Range Status   Specimen Description BLOOD SITE NOT SPECIFIED DRAWN BY RN  Final   Special Requests   Final    BOTTLES DRAWN AEROBIC AND ANAEROBIC Blood Culture adequate volume   Culture   Final    NO GROWTH 5 DAYS Performed at [facility], [address], [place], [place] [address]    Report Status 11/28/2018 FINAL  Final  Blood culture (routine x 2)     Status: None    Collection Time: 11/23/18  2:29 PM  Result Value Ref Range Status   Specimen Description BLOOD SITE NOT SPECIFIED  Final   Special Requests   Final    BOTTLES DRAWN AEROBIC AND ANAEROBIC Blood Culture results may not be optimal due to an inadequate volume of blood received in culture bottles   Culture   Final    NO GROWTH 5 DAYS Performed at [facility], [address] [place], [place] [address]    Report Status 11/28/2018 FINAL  Final  MRSA PCR Screening     Status: None   Collection Time: 11/27/18  5:23 PM  Result Value Ref Range Status   MRSA by PCR NEGATIVE NEGATIVE Final    Comment:        The GeneXpert MRSA Assay (FDA approved for NASAL specimens only), is one component of a comprehensive MRSA colonization surveillance program. It is not intended to diagnose MRSA infection nor to guide or monitor treatment for MRSA infections. Performed at [facility], [address], [place], [place] [address]   Expectorated sputum assessment w rflx to resp cult     Status: None   Collection Time: 11/28/18  5:29 AM  Result Value Ref Range Status   Specimen Description EXPECTORATED SPUTUM  Final   Special Requests NONE  Final   Sputum evaluation   Final    THIS SPECIMEN IS ACCEPTABLE FOR SPUTUM CULTURE Performed at [facility], [address] [place], [place] [address]    Report Status 11/28/2018 FINAL  Final  Culture, respiratory     Status: None (Preliminary result)   Collection Time: 11/28/18  5:29 AM  Result Value Ref Range Status   Specimen Description   Final    EXPECTORATED SPUTUM Performed at [facility], [address] [place], [place] [address]    Special Requests   Final    NONE Reflexed from [id number] Performed at [facility], [address], [place],  [place] [address]    Gram Stain   Final    RARE WBC PRESENT, PREDOMINANTLY PMN MODERATE GRAM POSITIVE COCCI FEW GRAM NEGATIVE RODS    Culture   Final    CULTURE REINCUBATED FOR BETTER GROWTH Performed at [facility], [place] [address] [place], [place] [address]    Report Status PENDING  Incomplete     Labs: BNP (last 3 results) No results for input(s): BNP in the last 8760 hours. Basic Metabolic Panel: Recent Labs  Lab 11/23/18 1422 11/24/18 0510 11/25/18 0449 11/26/18 0558 11/27/18 0627 11/28/18 0642 11/29/18 0510  NA 134* 138 140 137 137 139 140  K 3.3* 3.5 3.9 4.0 3.9 3.7 3.6  CL 101 110 114* 107 106 107 106  CO2 22 19* 17* 20* 23 24 26   GLUCOSE 111* 250* 204* 186* 152* 91 103*  BUN 24* 20 12 15  23* 22* 21*  CREATININE 0.96 0.86 0.64 0.62 0.69 0.66 0.67  CALCIUM 9.1 8.7* 9.1 9.4 9.0 9.0 8.8*  MG 1.4* 2.0 1.8 1.9  --   --   --    Liver Function Tests: Recent Labs  Lab 11/25/18 0449 11/26/18 0558 11/27/18 0627 11/28/18 0642  AST 32 32 32 42*  ALT 29 35 44 60*  ALKPHOS 71 79 76 71  BILITOT 0.6 1.1 0.8 0.8  PROT 6.5 6.9 6.7 6.5  ALBUMIN 3.2* 3.2* 3.1* 3.0*   No results for input(s): LIPASE, AMYLASE in the last 168 hours. No results for input(s): AMMONIA in the last 168 hours. CBC: Recent Labs  Lab 11/25/18 0449 11/26/18 0558 11/27/18 0627 11/28/18 0642 11/29/18 0510  WBC 12.6* 12.3* 12.6* 12.1* 10.8*  NEUTROABS 10.1* 9.4* 8.0* 6.3 6.1  HGB 12.7* 13.4 13.9 13.3 13.5  HCT 40.1 40.5 43.0 41.2 41.2  MCV 91.8 88.8 90.7 90.5 90.7  PLT 149* 198 233 254 255   Cardiac Enzymes: Recent Labs  Lab 11/23/18 1759 11/23/18 2254  TROPONINI 0.04* <0.03   BNP: Invalid input(s): POCBNP CBG: Recent Labs  Lab 11/28/18 1110 11/28/18 1610 11/28/18 2154 11/29/18 0800 11/29/18 1105  GLUCAP 127* 128* 147* 95 142*   D-Dimer No results for input(s): DDIMER in the last 72 hours. Hgb A1c No results for input(s): HGBA1C in the last 72 hours. Lipid Profile No results for input(s): CHOL, HDL, LDLCALC, TRIG, CHOLHDL, LDLDIRECT in the last 72 hours. Thyroid function studies No results for input(s): TSH, T4TOTAL, T3FREE, THYROIDAB in the last 72 hours.  Invalid input(s):  FREET3 Anemia work up No results for input(s): VITAMINB12, FOLATE, FERRITIN, TIBC, IRON, RETICCTPCT in the last 72 hours. Urinalysis    Component Value Date/Time   COLORURINE YELLOW 09/09/2016 0958   APPEARANCEUR CLEAR 09/09/2016 0958   LABSPEC <1.005 (L) 09/09/2016 0958   PHURINE 6.5 09/09/2016 0958   GLUCOSEU NEGATIVE 09/09/2016 0958   HGBUR NEGATIVE 09/09/2016 0958   BILIRUBINUR NEGATIVE 09/09/2016 0958   KETONESUR NEGATIVE 09/09/2016 0958   PROTEINUR NEGATIVE 09/09/2016 0958   UROBILINOGEN 0.2 08/03/2015 0608   NITRITE NEGATIVE 09/09/2016 0958   LEUKOCYTESUR NEGATIVE 09/09/2016 0958   Sepsis Labs Invalid input(s): PROCALCITONIN,  WBC,  LACTICIDVEN Microbiology Recent Results (from the past 240 hour(s))  Blood culture (routine x 2)     Status: None   Collection Time: 11/23/18  2:22 PM  Result Value Ref Range Status   Specimen Description BLOOD SITE NOT SPECIFIED DRAWN BY RN  Final   Special Requests   Final    BOTTLES DRAWN AEROBIC AND  ANAEROBIC Blood Culture adequate volume   Culture   Final    NO GROWTH 5 DAYS Performed at [facility], [address] [place], [place] [address]    Report Status 11/28/2018 FINAL  Final  Blood culture (routine x 2)     Status: None   Collection Time: 11/23/18  2:29 PM  Result Value Ref Range Status   Specimen Description BLOOD SITE NOT SPECIFIED  Final   Special Requests   Final    BOTTLES DRAWN AEROBIC AND ANAEROBIC Blood Culture results may not be optimal due to an inadequate volume of blood received in culture bottles   Culture   Final    NO GROWTH 5 DAYS Performed at [facility], [address] [place], [place] [address]    Report Status 11/28/2018 FINAL  Final  MRSA PCR Screening     Status: None   Collection Time: 11/27/18  5:23 PM  Result Value Ref Range Status   MRSA by PCR NEGATIVE NEGATIVE Final    Comment:        The GeneXpert MRSA Assay (FDA approved for NASAL specimens only), is one component of a comprehensive  MRSA colonization surveillance program. It is not intended to diagnose MRSA infection nor to guide or monitor treatment for MRSA infections. Performed at [facility], [address] [place], [place] [address]   Expectorated sputum assessment w rflx to resp cult     Status: None   Collection Time: 11/28/18  5:29 AM  Result Value Ref Range Status   Specimen Description EXPECTORATED SPUTUM  Final   Special Requests NONE  Final   Sputum evaluation   Final    THIS SPECIMEN IS ACCEPTABLE FOR SPUTUM CULTURE Performed at [facility], [address], [place], [place] [address]    Report Status 11/28/2018 FINAL  Final  Culture, respiratory     Status: None (Preliminary result)   Collection Time: 11/28/18  5:29 AM  Result Value Ref Range Status   Specimen Description   Final    EXPECTORATED SPUTUM Performed at [facility], [address], [place], [place] [address]    Special Requests   Final    NONE Reflexed from [id number] Performed at [facility], [address], [place], [place] [address]    Gram Stain   Final    RARE WBC PRESENT, PREDOMINANTLY PMN MODERATE GRAM POSITIVE COCCI FEW GRAM NEGATIVE RODS    Culture   Final    CULTURE REINCUBATED FOR BETTER GROWTH Performed at [facility], [place] [address] [place], [place] [address]    Report Status PENDING  Incomplete   Time coordinating discharge: 38 mins  SIGNED:  Irwin Brakeman, MD  [facility] 11/29/2018, 11:22 AM Pager [phone]  If 7PM-7AM, please contact night-coverage [email or web address] Password [email or web address]

## 2018-11-30 LAB — CULTURE, RESPIRATORY W GRAM STAIN: Culture: NORMAL

## 2018-12-01 DIAGNOSIS — I10 Essential (primary) hypertension: Secondary | ICD-10-CM | POA: Diagnosis not present

## 2018-12-01 DIAGNOSIS — J189 Pneumonia, unspecified organism: Secondary | ICD-10-CM | POA: Diagnosis not present

## 2018-12-01 DIAGNOSIS — G894 Chronic pain syndrome: Secondary | ICD-10-CM | POA: Diagnosis not present

## 2018-12-07 ENCOUNTER — Other Ambulatory Visit: Payer: Self-pay | Admitting: Family Medicine

## 2018-12-07 ENCOUNTER — Ambulatory Visit
Admission: RE | Admit: 2018-12-07 | Discharge: 2018-12-07 | Disposition: A | Discharge status: Home / Self Care | Payer: Medicare Other | Source: Ambulatory Visit | Attending: Family Medicine | Admitting: Family Medicine

## 2018-12-07 DIAGNOSIS — J181 Lobar pneumonia, unspecified organism: Principal | ICD-10-CM

## 2018-12-07 DIAGNOSIS — J189 Pneumonia, unspecified organism: Secondary | ICD-10-CM

## 2019-01-18 ENCOUNTER — Other Ambulatory Visit: Payer: Self-pay | Admitting: Family Medicine

## 2019-01-18 ENCOUNTER — Ambulatory Visit
Admission: RE | Admit: 2019-01-18 | Discharge: 2019-01-18 | Disposition: A | Discharge status: Home / Self Care | Payer: Medicare Other | Source: Ambulatory Visit | Attending: Family Medicine | Admitting: Family Medicine

## 2019-01-18 DIAGNOSIS — J189 Pneumonia, unspecified organism: Secondary | ICD-10-CM

## 2019-02-22 ENCOUNTER — Other Ambulatory Visit: Payer: Self-pay

## 2019-02-22 ENCOUNTER — Inpatient Hospital Stay (HOSPITAL_COMMUNITY)
Admission: EM | Admit: 2019-02-22 | Discharge: 2019-03-05 | DRG: 216 | Disposition: A | Discharge status: Home / Self Care | Payer: Medicare Other | Attending: Cardiothoracic Surgery | Admitting: Cardiothoracic Surgery

## 2019-02-22 ENCOUNTER — Emergency Department (HOSPITAL_COMMUNITY): Payer: Medicare Other

## 2019-02-22 ENCOUNTER — Encounter (HOSPITAL_COMMUNITY): Payer: Self-pay | Admitting: Emergency Medicine

## 2019-02-22 DIAGNOSIS — Y92238 Other place in hospital as the place of occurrence of the external cause: Secondary | ICD-10-CM | POA: Diagnosis not present

## 2019-02-22 DIAGNOSIS — M1A9XX0 Chronic gout, unspecified, without tophus (tophi): Secondary | ICD-10-CM

## 2019-02-22 DIAGNOSIS — Z79899 Other long term (current) drug therapy: Secondary | ICD-10-CM

## 2019-02-22 DIAGNOSIS — M545 Low back pain: Secondary | ICD-10-CM | POA: Diagnosis present

## 2019-02-22 DIAGNOSIS — R739 Hyperglycemia, unspecified: Secondary | ICD-10-CM | POA: Diagnosis present

## 2019-02-22 DIAGNOSIS — Z87442 Personal history of urinary calculi: Secondary | ICD-10-CM

## 2019-02-22 DIAGNOSIS — I471 Supraventricular tachycardia: Secondary | ICD-10-CM | POA: Diagnosis not present

## 2019-02-22 DIAGNOSIS — Z299 Encounter for prophylactic measures, unspecified: Secondary | ICD-10-CM

## 2019-02-22 DIAGNOSIS — Z8249 Family history of ischemic heart disease and other diseases of the circulatory system: Secondary | ICD-10-CM

## 2019-02-22 DIAGNOSIS — E6609 Other obesity due to excess calories: Secondary | ICD-10-CM

## 2019-02-22 DIAGNOSIS — Z951 Presence of aortocoronary bypass graft: Secondary | ICD-10-CM

## 2019-02-22 DIAGNOSIS — Z8679 Personal history of other diseases of the circulatory system: Secondary | ICD-10-CM

## 2019-02-22 DIAGNOSIS — J449 Chronic obstructive pulmonary disease, unspecified: Secondary | ICD-10-CM | POA: Diagnosis present

## 2019-02-22 DIAGNOSIS — Z6841 Body Mass Index (BMI) 40.0 and over, adult: Secondary | ICD-10-CM

## 2019-02-22 DIAGNOSIS — K219 Gastro-esophageal reflux disease without esophagitis: Secondary | ICD-10-CM | POA: Diagnosis present

## 2019-02-22 DIAGNOSIS — E669 Obesity, unspecified: Secondary | ICD-10-CM | POA: Diagnosis present

## 2019-02-22 DIAGNOSIS — Y840 Cardiac catheterization as the cause of abnormal reaction of the patient, or of later complication, without mention of misadventure at the time of the procedure: Secondary | ICD-10-CM | POA: Diagnosis not present

## 2019-02-22 DIAGNOSIS — I25110 Atherosclerotic heart disease of native coronary artery with unstable angina pectoris: Principal | ICD-10-CM | POA: Diagnosis present

## 2019-02-22 DIAGNOSIS — Z8349 Family history of other endocrine, nutritional and metabolic diseases: Secondary | ICD-10-CM

## 2019-02-22 DIAGNOSIS — Z87891 Personal history of nicotine dependence: Secondary | ICD-10-CM

## 2019-02-22 DIAGNOSIS — E785 Hyperlipidemia, unspecified: Secondary | ICD-10-CM | POA: Diagnosis present

## 2019-02-22 DIAGNOSIS — Z880 Allergy status to penicillin: Secondary | ICD-10-CM

## 2019-02-22 DIAGNOSIS — R0602 Shortness of breath: Secondary | ICD-10-CM | POA: Diagnosis not present

## 2019-02-22 DIAGNOSIS — D62 Acute posthemorrhagic anemia: Secondary | ICD-10-CM | POA: Diagnosis not present

## 2019-02-22 DIAGNOSIS — R079 Chest pain, unspecified: Secondary | ICD-10-CM | POA: Diagnosis not present

## 2019-02-22 DIAGNOSIS — G8929 Other chronic pain: Secondary | ICD-10-CM | POA: Diagnosis present

## 2019-02-22 DIAGNOSIS — K7581 Nonalcoholic steatohepatitis (NASH): Secondary | ICD-10-CM | POA: Diagnosis present

## 2019-02-22 DIAGNOSIS — M109 Gout, unspecified: Secondary | ICD-10-CM | POA: Diagnosis present

## 2019-02-22 DIAGNOSIS — D688 Other specified coagulation defects: Secondary | ICD-10-CM | POA: Diagnosis not present

## 2019-02-22 DIAGNOSIS — I480 Paroxysmal atrial fibrillation: Secondary | ICD-10-CM

## 2019-02-22 DIAGNOSIS — I200 Unstable angina: Secondary | ICD-10-CM

## 2019-02-22 DIAGNOSIS — Z955 Presence of coronary angioplasty implant and graft: Secondary | ICD-10-CM

## 2019-02-22 DIAGNOSIS — Z9689 Presence of other specified functional implants: Secondary | ICD-10-CM

## 2019-02-22 DIAGNOSIS — I7101 Dissection of thoracic aorta: Secondary | ICD-10-CM | POA: Diagnosis not present

## 2019-02-22 DIAGNOSIS — Z9889 Other specified postprocedural states: Secondary | ICD-10-CM

## 2019-02-22 DIAGNOSIS — Z8719 Personal history of other diseases of the digestive system: Secondary | ICD-10-CM

## 2019-02-22 DIAGNOSIS — Z833 Family history of diabetes mellitus: Secondary | ICD-10-CM

## 2019-02-22 DIAGNOSIS — I252 Old myocardial infarction: Secondary | ICD-10-CM

## 2019-02-22 DIAGNOSIS — E876 Hypokalemia: Secondary | ICD-10-CM | POA: Diagnosis not present

## 2019-02-22 DIAGNOSIS — E1151 Type 2 diabetes mellitus with diabetic peripheral angiopathy without gangrene: Secondary | ICD-10-CM | POA: Diagnosis present

## 2019-02-22 DIAGNOSIS — D696 Thrombocytopenia, unspecified: Secondary | ICD-10-CM | POA: Diagnosis present

## 2019-02-22 DIAGNOSIS — R200 Anesthesia of skin: Secondary | ICD-10-CM | POA: Diagnosis not present

## 2019-02-22 DIAGNOSIS — F1011 Alcohol abuse, in remission: Secondary | ICD-10-CM | POA: Diagnosis present

## 2019-02-22 DIAGNOSIS — Z7982 Long term (current) use of aspirin: Secondary | ICD-10-CM

## 2019-02-22 DIAGNOSIS — I10 Essential (primary) hypertension: Secondary | ICD-10-CM | POA: Diagnosis not present

## 2019-02-22 DIAGNOSIS — M25512 Pain in left shoulder: Secondary | ICD-10-CM | POA: Diagnosis not present

## 2019-02-22 DIAGNOSIS — I7100 Dissection of unspecified site of aorta: Secondary | ICD-10-CM | POA: Diagnosis present

## 2019-02-22 DIAGNOSIS — Z6836 Body mass index (BMI) 36.0-36.9, adult: Secondary | ICD-10-CM

## 2019-02-22 DIAGNOSIS — E8779 Other fluid overload: Secondary | ICD-10-CM | POA: Diagnosis not present

## 2019-02-22 DIAGNOSIS — Y713 Surgical instruments, materials and cardiovascular devices (including sutures) associated with adverse incidents: Secondary | ICD-10-CM | POA: Diagnosis not present

## 2019-02-22 DIAGNOSIS — Z800 Family history of malignant neoplasm of digestive organs: Secondary | ICD-10-CM

## 2019-02-22 DIAGNOSIS — I119 Hypertensive heart disease without heart failure: Secondary | ICD-10-CM | POA: Diagnosis present

## 2019-02-22 DIAGNOSIS — Z803 Family history of malignant neoplasm of breast: Secondary | ICD-10-CM

## 2019-02-22 DIAGNOSIS — K2270 Barrett's esophagus without dysplasia: Secondary | ICD-10-CM | POA: Diagnosis present

## 2019-02-22 DIAGNOSIS — E1165 Type 2 diabetes mellitus with hyperglycemia: Secondary | ICD-10-CM | POA: Diagnosis present

## 2019-02-22 DIAGNOSIS — Z8673 Personal history of transient ischemic attack (TIA), and cerebral infarction without residual deficits: Secondary | ICD-10-CM

## 2019-02-22 LAB — COMPREHENSIVE METABOLIC PANEL
ALT: 46 U/L — ABNORMAL HIGH (ref 0–44)
AST: 38 U/L (ref 15–41)
Albumin: 4.2 g/dL (ref 3.5–5.0)
Alkaline Phosphatase: 80 U/L (ref 38–126)
Anion gap: 8 (ref 5–15)
BILIRUBIN TOTAL: 0.2 mg/dL — AB (ref 0.3–1.2)
BUN: 13 mg/dL (ref 6–20)
CO2: 23 mmol/L (ref 22–32)
Calcium: 9.3 mg/dL (ref 8.9–10.3)
Chloride: 107 mmol/L (ref 98–111)
Creatinine, Ser: 0.69 mg/dL (ref 0.61–1.24)
GFR calc Af Amer: >60 mL/min (ref >60)
GFR calc non Af Amer: >60 mL/min (ref >60)
Glucose, Bld: 166 mg/dL — ABNORMAL HIGH (ref 70–99)
Potassium: 3.7 mmol/L (ref 3.5–5.1)
Sodium: 138 mmol/L (ref 135–145)
TOTAL PROTEIN: 6.9 g/dL (ref 6.5–8.1)

## 2019-02-22 LAB — TROPONIN I: Troponin I: <0.03 ng/mL (ref <0.03)

## 2019-02-22 LAB — CBC WITH DIFFERENTIAL/PLATELET
Abs Immature Granulocytes: 0.02 10*3/uL (ref 0.00–0.07)
Basophils Absolute: 0 10*3/uL (ref 0.0–0.1)
Basophils Relative: 0 %
EOS PCT: 2 %
Eosinophils Absolute: 0.1 10*3/uL (ref 0.0–0.5)
HCT: 42.6 % (ref 39.0–52.0)
Hemoglobin: 14 g/dL (ref 13.0–17.0)
Immature Granulocytes: 0 %
Lymphocytes Relative: 36 %
Lymphs Abs: 2.5 10*3/uL (ref 0.7–4.0)
MCH: 28.7 pg (ref 26.0–34.0)
MCHC: 32.9 g/dL (ref 30.0–36.0)
MCV: 87.5 fL (ref 80.0–100.0)
Monocytes Absolute: 0.6 10*3/uL (ref 0.1–1.0)
Monocytes Relative: 9 %
NRBC: 0 % (ref 0.0–0.2)
Neutro Abs: 3.6 10*3/uL (ref 1.7–7.7)
Neutrophils Relative %: 53 %
Platelets: 240 10*3/uL (ref 150–400)
RBC: 4.87 MIL/uL (ref 4.22–5.81)
RDW: 12.1 % (ref 11.5–15.5)
WBC: 6.9 10*3/uL (ref 4.0–10.5)

## 2019-02-22 LAB — BRAIN NATRIURETIC PEPTIDE: B Natriuretic Peptide: 50 pg/mL (ref 0.0–100.0)

## 2019-02-22 MED ORDER — ASPIRIN 81 MG PO CHEW
324.0000 mg | CHEWABLE_TABLET | Freq: Once | ORAL | Status: AC
  Administered 2019-02-22: 324 mg via ORAL
  Filled 2019-02-22: qty 4

## 2019-02-22 MED ORDER — ALLOPURINOL 100 MG PO TABS
100.0000 mg | ORAL_TABLET | Freq: Every morning | ORAL | Status: DC
  Administered 2019-02-23 – 2019-03-05 (×11): 100 mg via ORAL
  Filled 2019-02-22 (×12): qty 1

## 2019-02-22 MED ORDER — MORPHINE SULFATE (PF) 2 MG/ML IV SOLN: 2.0000 mg | INTRAVENOUS | Status: DC | PRN

## 2019-02-22 MED ORDER — ADULT MULTIVITAMIN W/MINERALS CH
1.0000 | ORAL_TABLET | Freq: Every day | ORAL | Status: DC
  Administered 2019-02-23 – 2019-02-28 (×6): 1 via ORAL
  Filled 2019-02-22 (×6): qty 1

## 2019-02-22 MED ORDER — HEPARIN SODIUM (PORCINE) 5000 UNIT/ML IJ SOLN
5000.0000 [IU] | Freq: Three times a day (TID) | INTRAMUSCULAR | Status: DC
  Administered 2019-02-22 – 2019-02-23 (×2): 5000 [IU] via SUBCUTANEOUS
  Filled 2019-02-22 (×2): qty 1

## 2019-02-22 MED ORDER — ONDANSETRON HCL 4 MG/2ML IJ SOLN: 4.0000 mg | Freq: Four times a day (QID) | INTRAMUSCULAR | Status: DC | PRN

## 2019-02-22 MED ORDER — NITROGLYCERIN 0.4 MG SL SUBL: 0.4000 mg | SUBLINGUAL_TABLET | SUBLINGUAL | Status: DC | PRN

## 2019-02-22 MED ORDER — IPRATROPIUM-ALBUTEROL 0.5-2.5 (3) MG/3ML IN SOLN
3.0000 mL | Freq: Once | RESPIRATORY_TRACT | Status: AC
  Administered 2019-02-22: 3 mL via RESPIRATORY_TRACT
  Filled 2019-02-22: qty 3

## 2019-02-22 MED ORDER — LOSARTAN POTASSIUM 50 MG PO TABS
100.0000 mg | ORAL_TABLET | Freq: Every day | ORAL | Status: DC
  Administered 2019-02-22 – 2019-02-23 (×2): 100 mg via ORAL
  Filled 2019-02-22: qty 4
  Filled 2019-02-22 (×2): qty 2

## 2019-02-22 MED ORDER — TRAZODONE HCL 50 MG PO TABS
100.0000 mg | ORAL_TABLET | Freq: Every evening | ORAL | Status: DC | PRN
  Administered 2019-02-22: 100 mg via ORAL
  Filled 2019-02-22: qty 2

## 2019-02-22 MED ORDER — IPRATROPIUM-ALBUTEROL 0.5-2.5 (3) MG/3ML IN SOLN
3.0000 mL | Freq: Three times a day (TID) | RESPIRATORY_TRACT | Status: DC | PRN
  Administered 2019-02-22: 3 mL via RESPIRATORY_TRACT
  Filled 2019-02-22: qty 3

## 2019-02-22 MED ORDER — AMLODIPINE BESYLATE 5 MG PO TABS
10.0000 mg | ORAL_TABLET | Freq: Every morning | ORAL | Status: DC
  Administered 2019-02-22 – 2019-02-23 (×2): 10 mg via ORAL
  Filled 2019-02-22 (×2): qty 2

## 2019-02-22 MED ORDER — PREDNISONE 20 MG PO TABS
40.0000 mg | ORAL_TABLET | Freq: Every day | ORAL | Status: DC
  Administered 2019-02-23 – 2019-02-25 (×3): 40 mg via ORAL
  Filled 2019-02-22 (×3): qty 2

## 2019-02-22 MED ORDER — ACETAMINOPHEN 325 MG PO TABS
650.0000 mg | ORAL_TABLET | Freq: Once | ORAL | Status: AC
  Administered 2019-02-22: 650 mg via ORAL
  Filled 2019-02-22: qty 2

## 2019-02-22 MED ORDER — ASPIRIN EC 325 MG PO TBEC
325.0000 mg | DELAYED_RELEASE_TABLET | Freq: Every day | ORAL | Status: DC
  Administered 2019-02-23: 325 mg via ORAL
  Filled 2019-02-22: qty 1

## 2019-02-22 MED ORDER — PREDNISONE 50 MG PO TABS
60.0000 mg | ORAL_TABLET | Freq: Once | ORAL | Status: AC
  Administered 2019-02-22: 60 mg via ORAL
  Filled 2019-02-22: qty 1

## 2019-02-22 MED ORDER — BUDESONIDE 0.5 MG/2ML IN SUSP
0.5000 mg | Freq: Two times a day (BID) | RESPIRATORY_TRACT | Status: DC
  Administered 2019-02-22 – 2019-03-05 (×19): 0.5 mg via RESPIRATORY_TRACT
  Filled 2019-02-22 (×20): qty 2

## 2019-02-22 MED ORDER — NIACIN ER (ANTIHYPERLIPIDEMIC) 500 MG PO TBCR
1000.0000 mg | EXTENDED_RELEASE_TABLET | Freq: Every day | ORAL | Status: DC
  Administered 2019-02-22: 1000 mg via ORAL
  Filled 2019-02-22 (×3): qty 2

## 2019-02-22 MED ORDER — OMEGA-3-ACID ETHYL ESTERS 1 G PO CAPS
1000.0000 mg | ORAL_CAPSULE | Freq: Every day | ORAL | Status: DC
  Administered 2019-02-22 – 2019-02-23 (×2): 1000 mg via ORAL
  Filled 2019-02-22 (×4): qty 1

## 2019-02-22 MED ORDER — METOPROLOL TARTRATE 50 MG PO TABS
100.0000 mg | ORAL_TABLET | Freq: Two times a day (BID) | ORAL | Status: DC
  Administered 2019-02-22 – 2019-02-23 (×2): 100 mg via ORAL
  Filled 2019-02-22 (×2): qty 2

## 2019-02-22 MED ORDER — ACETAMINOPHEN 325 MG PO TABS: 650.0000 mg | ORAL_TABLET | ORAL | Status: DC | PRN

## 2019-02-22 MED ORDER — PANTOPRAZOLE SODIUM 40 MG PO TBEC
40.0000 mg | DELAYED_RELEASE_TABLET | Freq: Two times a day (BID) | ORAL | Status: DC
  Administered 2019-02-22 – 2019-02-23 (×2): 40 mg via ORAL
  Filled 2019-02-22 (×2): qty 1

## 2019-02-22 NOTE — ED Notes (Signed)
Patient transported to X-ray 

## 2019-02-22 NOTE — ED Provider Notes (Signed)
Emergency Department Provider Note   I have reviewed the triage vital signs and the nursing notes.   HISTORY  Chief Complaint Shortness of Breath   HPI [name] [name] is a 58 y.o. male multiple medical problems as documented below the presents the emergency department today secondary to shortness of breath.  Initially patient states that he has shortness of breath with exertion.  Also sometimes at rest.  No hypoxia.  Patient no productive cough no lower extremity swelling.  Patient states that tried inhalers multiple times did not seem to help.  No associated diaphoresis or vomiting did have some intermittent nausea.  States he thought this might be related to a choking episode but thinks it was there before that. No other associated or modifying symptoms.    Past Medical History:  Diagnosis Date  . Adenomatous colon polyp 2009  . Alcohol abuse    abstinent since 1992  . Arthritis    back  . Asthma   . Barrett's esophagus 05/22/2015  . Chronic back pain   . COPD (chronic obstructive pulmonary disease) ([place])   . Coronary artery disease    70-80% ostial LAD '10  . GERD (gastroesophageal reflux disease)    takes Nexium daily  . Gout    takes ALlopurinol daily  . Hemorrhoids   . History of bronchitis 2015  . History of kidney stones   . Hyperlipidemia    was given a script a month ago but scared to take it.Medical Md is aware  . Hypertension    takes Losartan,Metoprolol,and Amlodipine daily  . Joint pain   . Lymphocytic colitis 2009  . Myocardial infarction [facility]) 2010   "light" (admit for CP 11/2009; ruled out for MI but had 70-80% ostail LAD stenosis and treated medically following NL perfusion study)  . NASH (nonalcoholic steatohepatitis)   . Peripheral vascular disease ([place])   . Personal history of colonic polyps 07/20/2002   Diminutive adenomas (3) 06/2002 diminutive adenoma (1) 2011   . Pre-diabetes   . Stroke ([place])   . TIA (transient ischemic attack)   .  Urinary urgency     Patient Active Problem List   Diagnosis Date Noted  . Sepsis ([place]) 11/24/2018  . Steroid-induced hyperglycemia 11/24/2018  . NASH (nonalcoholic steatohepatitis) 11/24/2018  . COPD (chronic obstructive pulmonary disease) ([place]) 11/24/2018  . Community acquired pneumonia 11/24/2018  . Community acquired pneumonia of right middle lobe of lung ([place]) 11/23/2018  . Chronic pain of left knee 03/01/2018  . 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 Chronic cholecystitis 08/03/2015  . Barrett's esophagus 05/22/2015  . Abnormal findings on radiological examination of gastrointestinal tract 05/02/2015  . RUQ abdominal pain 05/02/2015  . Steatosis 05/02/2015  . Chest pain 10/11/2013  . History of CVA (cerebrovascular accident) 10/11/2013  . CVA (cerebral infarction) 05/26/2012  . CAD (coronary artery disease) 05/26/2012  . HEMORRHOIDS-INTERNAL 03/12/2010  . GOUT 08/25/2007  . Essential hypertension 08/25/2007  . GERD 08/25/2007  . History of adenomatous polyp of colon 07/20/2002    Past Surgical History:  Procedure Laterality Date  . ANEURYSM COILING  12/2010   cerebral  . BAND HEMORRHOIDECTOMY  2003   at sigmoidoscopy  . CARDIAC CATHETERIZATION  2010  . CHOLECYSTECTOMY  08/03/2015  . CHOLECYSTECTOMY N/A 08/03/2015   Procedure: LAPAROSCOPIC CHOLECYSTECTOMY WITH INTRAOPERATIVE CHOLANGIOGRAM;  Surgeon: Fanny Skates, MD;  Location: [place];  Service: General;  Laterality: N/A;  . COLONOSCOPY  multiple  . ESOPHAGOGASTRODUODENOSCOPY    .  KNEE ARTHROSCOPY WITH MEDIAL MENISECTOMY Left 05/20/2017   Procedure: LEFT KNEE ARTHROSCOPY WITH PARTIAL MEDIAL MENISCECTOMY;  Surgeon: Leandrew Koyanagi, MD;  Location: [place];  Service: Orthopedics;  Laterality: Left;  . LUMBAR SPINE SURGERY     x 2  . NASAL SINUS SURGERY    . TEE WITHOUT CARDIOVERSION  05/28/2012   Procedure: TRANSESOPHAGEAL ECHOCARDIOGRAM (TEE);  Surgeon: Lelon Perla, MD;   Location: [facility];  Service: Cardiovascular;  Laterality: N/A;    Current Outpatient Rx  . Order #: [id number] Class: Historical Med  . Order #: [id number] Class: Historical Med  . Order #: [id number] Class: Historical Med  . Order #: [id number] Class: Historical Med  . Order #: [id number] Class: Historical Med  . Order #: [id number] Class: Historical Med  . Order #: [id number] Class: Normal  . Order #: [id number] Class: Historical Med  . Order #: [id number] Class: Historical Med  . Order #: [id number] Class: Historical Med  . Order #: [id number] Class: Historical Med  . Order #: [id number] Class: Historical Med  . Order #: [id number] Class: Historical Med  . Order #: [id number] Class: Historical Med  . Order #: [id number] Class: Historical Med  . Order #: [id number] Class: Historical Med    Allergies Penicillins  Family History  Problem Relation Age of Onset  . Liver disease Mother   . Liver cancer Mother   . Breast cancer Mother   . Cancer Mother   . Hypertension Mother   . Heart attack Mother   . Cancer Father   . Heart disease Father        before age 50  . Hyperlipidemia Father   . Hypertension Father   . Heart attack Father   . Heart disease Other        multiple family members on maternal and paternal side of family  . Diabetes Sister   . Hyperlipidemia Sister   . Hypertension Sister   . Diabetes Paternal Grandmother   . Hypertension Brother   . Colon cancer Neg Hx     Social History Social History   Tobacco Use  . Smoking status: Former Smoker    Packs/day: 1.00    Years: 4.00    Pack years: 4.00    Types: Cigarettes    Last attempt to quit: 12/22/2018    Years since quitting: 0.1  . Smokeless tobacco: Never Used  Substance Use Topics  . Alcohol use: No    Alcohol/week: 0.0 standard drinks    Comment: no alcohol in 7yr   . Drug use: No    Review of Systems  All other systems negative except as documented in the HPI. All pertinent positives and negatives as reviewed in the  HPI. ____________________________________________   PHYSICAL EXAM:  VITAL SIGNS: ED Triage Vitals  Enc Vitals Group     BP 02/22/19 1058 (!) 142/82     Pulse Rate 02/22/19 1058 75     Resp 02/22/19 1058 19     Temp 02/22/19 1058 98.3 F (36.8 C)     Temp Source 02/22/19 1058 Oral     SpO2 02/22/19 1058 97 %     Weight 02/22/19 1057 229 lb (103.9 kg)     Height 02/22/19 1057 5' 9"  (1.753 m)     Head Circumference --      Peak Flow --      Pain Score 02/22/19 1057 0     Pain Loc --      Pain Edu? --      Excl. in [place] --  Constitutional: Alert and oriented. Well appearing and in no acute distress. Eyes: Conjunctivae are normal. PERRL. EOMI. Head: Atraumatic. Nose: No congestion/rhinnorhea. Mouth/Throat: Mucous membranes are moist.  Oropharynx non-erythematous. Neck: No stridor.  No meningeal signs.   Cardiovascular: Normal rate, regular rhythm. Good peripheral circulation. Grossly normal heart sounds.   Respiratory: Normal respiratory effort.  No retractions. Lungs CTAB. Gastrointestinal: Soft and nontender. No distention.  Musculoskeletal: No lower extremity tenderness nor edema. No gross deformities of extremities. Neurologic:  Normal speech and language. No gross focal neurologic deficits are appreciated.  Skin:  Skin is warm, dry and intact. No rash noted.   ____________________________________________   LABS (all labs ordered are listed, but only abnormal results are displayed)  Labs Reviewed  COMPREHENSIVE METABOLIC PANEL - Abnormal; Notable for the following components:      Result Value   Glucose, Bld 166 (*)    ALT 46 (*)    Total Bilirubin 0.2 (*)    All other components within normal limits  CBC WITH DIFFERENTIAL/PLATELET  TROPONIN I  BRAIN NATRIURETIC PEPTIDE   ____________________________________________  EKG   EKG Interpretation  Date/Time:  Tuesday February 22 2019 11:01:10 EST Ventricular Rate:  75 PR Interval:    QRS Duration: 135 QT  Interval:  402 QTC Calculation: 449 R Axis:     Text Interpretation:  Sinus rhythm Right bundle branch block No significant change since last tracing Confirmed by Merrily Pew [phone]) on 02/22/2019 4:26:42 PM       ____________________________________________  RADIOLOGY  Dg Chest 2 View  Result Date: 02/22/2019 CLINICAL DATA:  Shortness of breath. EXAM: CHEST - 2 VIEW COMPARISON:  01/18/2019. FINDINGS: Mediastinum and hilar structures normal. Stable cardiomegaly with normal pulmonary vascularity. Mild bilateral interstitial prominence. Pneumonitis could present this fashion. No pleural effusion or pneumothorax. No acute bony abnormality. IMPRESSION: 1. Mild bilateral pulmonary interstitial prominence. Pneumonitis could present this fashion. 2.  Stable cardiomegaly.  Normal pulmonary vascularity. Electronically Signed   By: Marcello Moores  Register   On: 02/22/2019 12:21    ____________________________________________   PROCEDURES  Procedure(s) performed:   Procedures   ____________________________________________   INITIAL IMPRESSION / ASSESSMENT AND PLAN / ED COURSE  Initially thought to be COPD exacerbation with his description of the symptoms however after attending ambulating patient stated he started having chest pressure with ambulation.  He then recounts that this does happen too often with ambulation and exertion gets better with rest.  He did not state this earlier.  His EKG and troponin were fine but this could be a sign of unstable angina esp w/ his history of CAD.  His symptoms did not seem to improve at all with a DuoNeb or steroids.  His chest x-ray showed questionable pneumonitis.  Discussed with hospitalist who will admit for observation.     Pertinent labs & imaging results that were available during my care of the patient were reviewed by me and considered in my medical decision making (see chart for details).  ____________________________________________  FINAL  CLINICAL IMPRESSION(S) / ED DIAGNOSES  Final diagnoses:  None     MEDICATIONS GIVEN DURING THIS VISIT:  Medications  acetaminophen (TYLENOL) tablet 650 mg (has no administration in time range)  ondansetron (ZOFRAN) injection 4 mg (has no administration in time range)  heparin injection 5,000 Units (has no administration in time range)  morphine 2 MG/ML injection 2 mg (has no administration in time range)  aspirin EC tablet 325 mg (has no administration in time range)  pantoprazole (PROTONIX) EC  tablet 40 mg (40 mg Oral Given 02/22/19 1610)  metoprolol tartrate (LOPRESSOR) tablet 100 mg (has no administration in time range)  losartan (COZAAR) tablet 100 mg (100 mg Oral Given 02/22/19 1610)  allopurinol (ZYLOPRIM) tablet 100 mg (has no administration in time range)  amLODipine (NORVASC) tablet 10 mg (10 mg Oral Given 02/22/19 1610)  multivitamin with minerals tablet 1 tablet (has no administration in time range)  niacin (NIASPAN) CR tablet 1,000 mg (has no administration in time range)  omega-3 acid ethyl esters (LOVAZA) capsule 1,000 mg (has no administration in time range)  traZODone (DESYREL) tablet 100 mg (has no administration in time range)  ipratropium-albuterol (DUONEB) 0.5-2.5 (3) MG/3ML nebulizer solution 3 mL (has no administration in time range)  ipratropium-albuterol (DUONEB) 0.5-2.5 (3) MG/3ML nebulizer solution 3 mL (3 mLs Nebulization Given 02/22/19 1117)  predniSONE (DELTASONE) tablet 60 mg (60 mg Oral Given 02/22/19 1120)  acetaminophen (TYLENOL) tablet 650 mg (650 mg Oral Given 02/22/19 1422)  aspirin chewable tablet 324 mg (324 mg Oral Given 02/22/19 1448)     NEW OUTPATIENT MEDICATIONS STARTED DURING THIS VISIT:  New Prescriptions   No medications on file    Note:  This note was prepared with assistance of Dragon voice recognition software. Occasional wrong-word or sound-a-like substitutions may have occurred due to the inherent limitations of voice recognition  software.   Eowyn Tabone, Corene Cornea, MD 02/22/19 [phone]

## 2019-02-22 NOTE — ED Notes (Signed)
Patient ambulated around the nurses station with oyygen saturation maintaining 98 percent and higher. Patient did complain of pressure in the center of his chest with ambulation.

## 2019-02-22 NOTE — ED Triage Notes (Signed)
Patient complaining of shortness of breath x 2 weeks worsening over the last week. States breathing treatments at home are ineffective.

## 2019-02-22 NOTE — H&P (Signed)
History and Physical    [name] [name] HFW:[id number] DOB: [birth year] DOA: 02/22/2019  Referring MD/NP/PA: Dr. Dayna Barker PCP: [name] Frees, MD  Patient coming from: Home  Chief Complaint: Shortness of breath, dyspnea on exertion and intermittent chest discomfort.  HPI: [name] [name] is a 58 y.o. male with a past medical history significant for COPD, gastroesophageal reflux disease, gout, hypertension, class II obesity, prior history of stroke and CAD; who presented to the emergency department secondary to worsening shortness of breath and intermittent chest discomfort.  Patient reported that his breathing for the last couple weeks has been intermittently affected, making in to experience difficulty on exertion and limiting his capacity to perform activities.  Patient reports some chest tightness/central chest pain, present with activity and relieved by resting, the pain is intermittent, 6-7/10, without associated factors.  Patient denies fever, chills, nausea, vomiting, abdominal pain, dysuria, hematuria, melena, hematochezia, headaches, blurred vision, focal weakness or any other complaints.  Of note, patient reported episodes of choking, hoarseness and intermittent sensation of uncontrolled reflux.  In the ED patient received Solu-Medrol, nebulizer treatment and a chest x-ray that demonstrated pneumonitis component.  He was afebrile, normal blood work, negative troponin x1 and no acute ischemic changes on EKG.  While ambulating around nursing station he develops shortness of breath and central chest discomfort.  TRH has been consulted to place patient in observation for ACS rule out and further evaluation and management.  Past Medical/Surgical History: Past Medical History:  Diagnosis Date  . Adenomatous colon polyp 2009  . Alcohol abuse    abstinent since 1992  . Arthritis    back  . Asthma   . Barrett's esophagus 05/22/2015  . Chronic back pain   . COPD (chronic obstructive  pulmonary disease) ([place])   . Coronary artery disease    70-80% ostial LAD '10  . GERD (gastroesophageal reflux disease)    takes Nexium daily  . Gout    takes ALlopurinol daily  . Hemorrhoids   . History of bronchitis 2015  . History of kidney stones   . Hyperlipidemia    was given a script a month ago but scared to take it.Medical Md is aware  . Hypertension    takes Losartan,Metoprolol,and Amlodipine daily  . Joint pain   . Lymphocytic colitis 2009  . Myocardial infarction [facility]) 2010   "light" (admit for CP 11/2009; ruled out for MI but had 70-80% ostail LAD stenosis and treated medically following NL perfusion study)  . NASH (nonalcoholic steatohepatitis)   . Peripheral vascular disease ([place])   . Personal history of colonic polyps 07/20/2002   Diminutive adenomas (3) 06/2002 diminutive adenoma (1) 2011   . Pre-diabetes   . Stroke ([place])   . TIA (transient ischemic attack)   . Urinary urgency     Past Surgical History:  Procedure Laterality Date  . ANEURYSM COILING  12/2010   cerebral  . BAND HEMORRHOIDECTOMY  2003   at sigmoidoscopy  . CARDIAC CATHETERIZATION  2010  . CHOLECYSTECTOMY  08/03/2015  . CHOLECYSTECTOMY N/A 08/03/2015   Procedure: LAPAROSCOPIC CHOLECYSTECTOMY WITH INTRAOPERATIVE CHOLANGIOGRAM;  Surgeon: Fanny Skates, MD;  Location: [place];  Service: General;  Laterality: N/A;  . COLONOSCOPY  multiple  . ESOPHAGOGASTRODUODENOSCOPY    . KNEE ARTHROSCOPY WITH MEDIAL MENISECTOMY Left 05/20/2017   Procedure: LEFT KNEE ARTHROSCOPY WITH PARTIAL MEDIAL MENISCECTOMY;  Surgeon: Leandrew Koyanagi, MD;  Location: [place];  Service: Orthopedics;  Laterality: Left;  . LUMBAR SPINE SURGERY  x 2  . NASAL SINUS SURGERY    . TEE WITHOUT CARDIOVERSION  05/28/2012   Procedure: TRANSESOPHAGEAL ECHOCARDIOGRAM (TEE);  Surgeon: Lelon Perla, MD;  Location: [facility];  Service: Cardiovascular;  Laterality: N/A;    Social History:  reports that he quit smoking  about 2 months ago. His smoking use included cigarettes. He has a 4.00 pack-year smoking history. He has never used smokeless tobacco. He reports that he does not drink alcohol or use drugs.  Allergies: Allergies  Allergen Reactions  . Penicillins Hives and Rash    Did it involve swelling of the face/tongue/throat, SOB, or low BP? Yes Did it involve sudden or severe rash/hives, skin peeling, or any reaction on the inside of your mouth or nose? Yes  Did you need to seek medical attention at a hospital or doctor's office? Yes When did it last happen?As a child. If all above answers are "NO", may proceed with cephalosporin use.     Family History:  Family History  Problem Relation Age of Onset  . Liver disease Mother   . Liver cancer Mother   . Breast cancer Mother   . Cancer Mother   . Hypertension Mother   . Heart attack Mother   . Cancer Father   . Heart disease Father        before age 34  . Hyperlipidemia Father   . Hypertension Father   . Heart attack Father   . Heart disease Other        multiple family members on maternal and paternal side of family  . Diabetes Sister   . Hyperlipidemia Sister   . Hypertension Sister   . Diabetes Paternal Grandmother   . Hypertension Brother   . Colon cancer Neg Hx     Prior to Admission medications   Medication Sig Start Date End Date Taking? Authorizing Provider  albuterol (PROVENTIL HFA;VENTOLIN HFA) 108 (90 Base) MCG/ACT inhaler Inhale 1 puff into the lungs every 6 (six) hours as needed for wheezing or shortness of breath.    Yes [provider]  allopurinol (ZYLOPRIM) 100 MG tablet Take 100 mg by mouth every morning.    Yes [provider]  amLODipine (NORVASC) 10 MG tablet Take 10 mg by mouth every morning.    Yes [provider]  aspirin 325 MG tablet Take 325 mg by mouth every morning.    Yes [provider]  esomeprazole (NEXIUM) 40 MG capsule Take 40 mg by mouth 2 (two) times daily.    Yes [provider]  HYDROcodone-acetaminophen (NORCO/VICODIN) 5-325 MG tablet Take 1-2 tablets by mouth 2 (two) times daily as needed for moderate pain or severe pain.  12/23/18  Yes [provider]  ipratropium-albuterol (DUONEB) 0.5-2.5 (3) MG/3ML SOLN Take 3 mLs by nebulization every 8 (eight) hours as needed (wheezing, shortness of breath). 11/29/18  Yes Johnson, Clanford L, MD  losartan (COZAAR) 100 MG tablet Take 100 mg by mouth every morning.    Yes [provider]  metoprolol (LOPRESSOR) 100 MG tablet Take 100 mg by mouth 2 (two) times daily.   Yes [provider]  Milk Thistle 500 MG CAPS Take 1,000 mg by mouth daily.    Yes [provider]  Multiple Vitamin (MULITIVITAMIN WITH MINERALS) TABS Take 1 tablet by mouth daily.   Yes [provider]  naproxen (NAPROSYN) 500 MG tablet TAKE 1 TABLET BY MOUTH ONCE DAILY WITH FOOD OR MILK AS NEEDED FOR PAIN FOR 30 DAYS  02/03/19  Yes [provider]  niacin (NIASPAN) 1000 MG CR tablet Take 1,000 mg by mouth at bedtime.   Yes [provider]  nitroGLYCERIN (NITROSTAT) 0.4 MG SL tablet Place 0.4 mg under the tongue every 5 (five) minutes as needed. For chest pain   Yes [provider]  Omega-3 Fatty Acids (FISH OIL) 1200 MG CPDR Take 1,200 mg by mouth daily.   Yes [provider]  traZODone (DESYREL) 100 MG tablet Take 100 mg by mouth at bedtime.    Yes [provider]    Review of Systems:  Negative except as otherwise mentioned in HPI.   Physical Exam: Vitals:   02/22/19 1530 02/22/19 1600 02/22/19 1646 02/22/19 1655  BP: (!) 146/88 (!) 160/92 (!) 162/97   Pulse: 66 77 74   Resp: 20 15 20    Temp:   98.6 F (37 C)   TempSrc:   Oral   SpO2: 99% 99% 97%   Weight:    110.2 kg  Height:    5' 8"  (1.727 m)    Constitutional: NAD, calm, comfortable; reports feeling short of breath with minimal exertion and while ambulating experiencing some  central chest tightness.  No major symptoms while resting. Eyes: PERRL, lids and conjunctivae normal, no icterus, no nystagmus. ENMT: Mucous membranes are moist. Posterior pharynx clear of any exudate or lesions. Neck: normal, supple, no masses, no thyromegaly, no JVD. Respiratory: Very little expiratory wheezing, no rhonchi's, no crackles, no using accessory muscles, normal respiratory effort. Cardiovascular: Regular rate and rhythm, no murmurs / rubs / gallops.  Trace lower extremity edema appreciated on exam.  2+ pedal pulses. No carotid bruits.  Abdomen: Obese, no tenderness, no masses palpated. No hepatosplenomegaly. Bowel sounds positive.  Musculoskeletal: no clubbing / cyanosis. No joint deformity upper and lower extremities. Good ROM, no contractures. Normal muscle tone.  Skin: no rashes, lesions, ulcers. No induration Neurologic: CN 2-12 grossly intact. Sensation intact, DTR normal. Strength 5/5 in upper extremities bilaterally; 4/5 in his lower extremities due to secondary residual deficits after lower back surgery (chronic and unchanged)..  Psychiatric: Normal judgment and insight. Alert and oriented x 3. Normal mood.    Labs on Admission: I have personally reviewed the following labs and imaging studies  CBC: Recent Labs  Lab 02/22/19 1257  WBC 6.9  NEUTROABS 3.6  HGB 14.0  HCT 42.6  MCV 87.5  PLT 322   Basic Metabolic Panel: Recent Labs  Lab 02/22/19 1230  NA 138  K 3.7  CL 107  CO2 23  GLUCOSE 166*  BUN 13  CREATININE 0.69  CALCIUM 9.3   GFR: Estimated Creatinine Clearance: 121.1 mL/min (by C-G formula based on SCr of 0.69 mg/dL).   Liver Function Tests: Recent Labs  Lab 02/22/19 1230  AST 38  ALT 46*  ALKPHOS 80  BILITOT 0.2*  PROT 6.9  ALBUMIN 4.2   Cardiac Enzymes: Recent Labs  Lab 02/22/19 1230  TROPONINI <0.03   Urine analysis:    Component Value Date/Time   COLORURINE YELLOW 09/09/2016 [place] 09/09/2016 0958    LABSPEC <1.005 (L) 09/09/2016 0958   PHURINE 6.5 09/09/2016 0958   GLUCOSEU NEGATIVE 09/09/2016 0958   HGBUR NEGATIVE 09/09/2016 0958   BILIRUBINUR NEGATIVE 09/09/2016 0958   KETONESUR NEGATIVE 09/09/2016 0958   PROTEINUR NEGATIVE 09/09/2016 0958   UROBILINOGEN 0.2 08/03/2015 0608   NITRITE NEGATIVE 09/09/2016 0958   LEUKOCYTESUR NEGATIVE 09/09/2016 0958   Radiological Exams on Admission: Dg Chest  2 View  Result Date: 02/22/2019 CLINICAL DATA:  Shortness of breath. EXAM: CHEST - 2 VIEW COMPARISON:  01/18/2019. FINDINGS: Mediastinum and hilar structures normal. Stable cardiomegaly with normal pulmonary vascularity. Mild bilateral interstitial prominence. Pneumonitis could present this fashion. No pleural effusion or pneumothorax. No acute bony abnormality. IMPRESSION: 1. Mild bilateral pulmonary interstitial prominence. Pneumonitis could present this fashion. 2.  Stable cardiomegaly.  Normal pulmonary vascularity. Electronically Signed   By: Marcello Moores  Register   On: 02/22/2019 12:21    EKG: Independently reviewed.  Normal sinus rhythm; no acute ischemic changes appreciated on EKG.  Assessment/Plan 1-chest pain and dyspnea on exertion: Heart score 4 -Place in observation telemetry bed -Started on aspirin, beta-blocker and as needed nitroglycerin -Cycle troponin and EKG -Check 2D echo -Cardiology has been consulted. -N.p.o. after midnight in case Myoview decided by cardiology service. -Check A1c and lipid panel -Increase PPI to twice a day  2-shortness of breath/COPD -Very little wheezing on exam -Has not had PFTs yet -Only using duo nebs as an outpatient. -Chest x-ray demonstrating signs of pneumonitis but no frank infiltrates -Continue PRN nebulizer and start Pulmicort -Will treat with 5 days of prednisone. -Patient will benefit at discharge of starting Spiriva daily, Symbicort or Dulera twice a day and continue as needed albuterol. -Outpatient follow-up with pulmonology as  recommended.   3-essential hypertension -Resume home antihypertensive regimen -Heart healthy diet has been ordered.  4-GOUT -Continue allopurinol -No acute flare appreciated.  5-GERD -Protonix 40 mg adjusted to twice a day -Lifestyle changes discussed with patient. -Symptoms persisted despite full dose of PPI patient will need gastroenterology evaluation for EGD.  6-Hyperglycemia -No prior history of diabetes -CBGs in the 160 range -Repeat fasting blood sugar in a.m. and check A1c.  7-dyslipidemia -Continue niacin and fish oil -Check lipid panel.  8-prior history of stroke -No significant residual deficits -Continue full dose aspirin for secondary prevention  9-class II obesity -Body mass index is 36.95 kg/m.  -Low calorie diet, portion control and increase physical activity discussed with patient.   DVT prophylaxis: Heparin Code Status: Full code Family Communication: No family at bedside Disposition Plan: Anticipate discharge back home once rule out ACS work-up completed. Consults called: Cardiology service Admission status: Observation, length of stay less than 2 midnights, telemetry bed.   Time Spent: 65 minutes  Barton Dubois MD [facility] Pager [phone]  02/22/2019, 6:41 PM

## 2019-02-23 ENCOUNTER — Inpatient Hospital Stay (HOSPITAL_COMMUNITY): Payer: Medicare Other

## 2019-02-23 ENCOUNTER — Inpatient Hospital Stay (HOSPITAL_COMMUNITY): Payer: Medicare Other | Admitting: Certified Registered"

## 2019-02-23 ENCOUNTER — Observation Stay (HOSPITAL_COMMUNITY): Payer: Medicare Other

## 2019-02-23 ENCOUNTER — Encounter (HOSPITAL_COMMUNITY): Payer: Self-pay | Admitting: Student

## 2019-02-23 ENCOUNTER — Encounter (HOSPITAL_COMMUNITY)
Admission: EM | Disposition: A | Discharge status: Home / Self Care | Payer: Self-pay | Source: Home / Self Care | Attending: Cardiothoracic Surgery

## 2019-02-23 DIAGNOSIS — D696 Thrombocytopenia, unspecified: Secondary | ICD-10-CM | POA: Diagnosis present

## 2019-02-23 DIAGNOSIS — K7581 Nonalcoholic steatohepatitis (NASH): Secondary | ICD-10-CM | POA: Diagnosis present

## 2019-02-23 DIAGNOSIS — Z6841 Body Mass Index (BMI) 40.0 and over, adult: Secondary | ICD-10-CM | POA: Diagnosis not present

## 2019-02-23 DIAGNOSIS — J449 Chronic obstructive pulmonary disease, unspecified: Secondary | ICD-10-CM | POA: Diagnosis present

## 2019-02-23 DIAGNOSIS — R200 Anesthesia of skin: Secondary | ICD-10-CM | POA: Diagnosis not present

## 2019-02-23 DIAGNOSIS — I7100 Dissection of unspecified site of aorta: Secondary | ICD-10-CM | POA: Diagnosis present

## 2019-02-23 DIAGNOSIS — I200 Unstable angina: Secondary | ICD-10-CM | POA: Diagnosis not present

## 2019-02-23 DIAGNOSIS — I471 Supraventricular tachycardia: Secondary | ICD-10-CM | POA: Diagnosis not present

## 2019-02-23 DIAGNOSIS — I10 Essential (primary) hypertension: Secondary | ICD-10-CM

## 2019-02-23 DIAGNOSIS — Z9889 Other specified postprocedural states: Secondary | ICD-10-CM | POA: Diagnosis not present

## 2019-02-23 DIAGNOSIS — G8929 Other chronic pain: Secondary | ICD-10-CM | POA: Diagnosis present

## 2019-02-23 DIAGNOSIS — M109 Gout, unspecified: Secondary | ICD-10-CM | POA: Diagnosis present

## 2019-02-23 DIAGNOSIS — Z951 Presence of aortocoronary bypass graft: Secondary | ICD-10-CM | POA: Diagnosis not present

## 2019-02-23 DIAGNOSIS — K2270 Barrett's esophagus without dysplasia: Secondary | ICD-10-CM | POA: Diagnosis present

## 2019-02-23 DIAGNOSIS — I119 Hypertensive heart disease without heart failure: Secondary | ICD-10-CM | POA: Diagnosis present

## 2019-02-23 DIAGNOSIS — D62 Acute posthemorrhagic anemia: Secondary | ICD-10-CM | POA: Diagnosis not present

## 2019-02-23 DIAGNOSIS — I25118 Atherosclerotic heart disease of native coronary artery with other forms of angina pectoris: Secondary | ICD-10-CM | POA: Diagnosis not present

## 2019-02-23 DIAGNOSIS — E1151 Type 2 diabetes mellitus with diabetic peripheral angiopathy without gangrene: Secondary | ICD-10-CM | POA: Diagnosis present

## 2019-02-23 DIAGNOSIS — I25110 Atherosclerotic heart disease of native coronary artery with unstable angina pectoris: Secondary | ICD-10-CM | POA: Diagnosis present

## 2019-02-23 DIAGNOSIS — Y840 Cardiac catheterization as the cause of abnormal reaction of the patient, or of later complication, without mention of misadventure at the time of the procedure: Secondary | ICD-10-CM | POA: Diagnosis not present

## 2019-02-23 DIAGNOSIS — K219 Gastro-esophageal reflux disease without esophagitis: Secondary | ICD-10-CM | POA: Diagnosis present

## 2019-02-23 DIAGNOSIS — R0602 Shortness of breath: Secondary | ICD-10-CM | POA: Diagnosis present

## 2019-02-23 DIAGNOSIS — M545 Low back pain: Secondary | ICD-10-CM | POA: Diagnosis present

## 2019-02-23 DIAGNOSIS — Y713 Surgical instruments, materials and cardiovascular devices (including sutures) associated with adverse incidents: Secondary | ICD-10-CM | POA: Diagnosis not present

## 2019-02-23 DIAGNOSIS — E8779 Other fluid overload: Secondary | ICD-10-CM | POA: Diagnosis not present

## 2019-02-23 DIAGNOSIS — E669 Obesity, unspecified: Secondary | ICD-10-CM | POA: Diagnosis present

## 2019-02-23 DIAGNOSIS — E1165 Type 2 diabetes mellitus with hyperglycemia: Secondary | ICD-10-CM | POA: Diagnosis present

## 2019-02-23 DIAGNOSIS — I7101 Dissection of thoracic aorta: Secondary | ICD-10-CM | POA: Diagnosis not present

## 2019-02-23 DIAGNOSIS — I480 Paroxysmal atrial fibrillation: Secondary | ICD-10-CM | POA: Diagnosis not present

## 2019-02-23 DIAGNOSIS — Y92238 Other place in hospital as the place of occurrence of the external cause: Secondary | ICD-10-CM | POA: Diagnosis not present

## 2019-02-23 DIAGNOSIS — E785 Hyperlipidemia, unspecified: Secondary | ICD-10-CM | POA: Diagnosis present

## 2019-02-23 DIAGNOSIS — E876 Hypokalemia: Secondary | ICD-10-CM | POA: Diagnosis not present

## 2019-02-23 DIAGNOSIS — M25512 Pain in left shoulder: Secondary | ICD-10-CM | POA: Diagnosis not present

## 2019-02-23 DIAGNOSIS — D688 Other specified coagulation defects: Secondary | ICD-10-CM | POA: Diagnosis not present

## 2019-02-23 HISTORY — PX: CORONARY STENT INTERVENTION: CATH118234

## 2019-02-23 HISTORY — PX: REPAIR OF ACUTE ASCENDING THORACIC AORTIC DISSECTION: SHX6323

## 2019-02-23 HISTORY — PX: TEE WITHOUT CARDIOVERSION: SHX5443

## 2019-02-23 HISTORY — PX: LEFT HEART CATH AND CORONARY ANGIOGRAPHY: CATH118249

## 2019-02-23 HISTORY — PX: CORONARY ARTERY BYPASS GRAFT: SHX141

## 2019-02-23 LAB — CBC
HEMATOCRIT: 38.9 % — AB (ref 39.0–52.0)
Hemoglobin: 12.9 g/dL — ABNORMAL LOW (ref 13.0–17.0)
MCH: 28.4 pg (ref 26.0–34.0)
MCHC: 33.2 g/dL (ref 30.0–36.0)
MCV: 85.7 fL (ref 80.0–100.0)
Platelets: 223 10*3/uL (ref 150–400)
RBC: 4.54 MIL/uL (ref 4.22–5.81)
RDW: 12.2 % (ref 11.5–15.5)
WBC: 8.1 10*3/uL (ref 4.0–10.5)
nRBC: 0 % (ref 0.0–0.2)

## 2019-02-23 LAB — COMPREHENSIVE METABOLIC PANEL
ALT: 39 U/L (ref 0–44)
AST: 28 U/L (ref 15–41)
Albumin: 3.7 g/dL (ref 3.5–5.0)
Alkaline Phosphatase: 66 U/L (ref 38–126)
Anion gap: 10 (ref 5–15)
BUN: 10 mg/dL (ref 6–20)
CO2: 20 mmol/L — AB (ref 22–32)
Calcium: 9 mg/dL (ref 8.9–10.3)
Chloride: 101 mmol/L (ref 98–111)
Creatinine, Ser: 0.74 mg/dL (ref 0.61–1.24)
GFR calc Af Amer: >60 mL/min (ref >60)
GFR calc non Af Amer: >60 mL/min (ref >60)
Glucose, Bld: 129 mg/dL — ABNORMAL HIGH (ref 70–99)
POTASSIUM: 3.9 mmol/L (ref 3.5–5.1)
SODIUM: 131 mmol/L — AB (ref 135–145)
Total Bilirubin: 0.9 mg/dL (ref 0.3–1.2)
Total Protein: 6.1 g/dL — ABNORMAL LOW (ref 6.5–8.1)

## 2019-02-23 LAB — PLATELET COUNT: Platelets: 162 10*3/uL (ref 150–400)

## 2019-02-23 LAB — LIPID PANEL
CHOL/HDL RATIO: 5 ratio
Cholesterol: 167 mg/dL (ref 0–200)
Cholesterol: 156 mg/dL (ref 0–200)
HDL: 35 mg/dL — ABNORMAL LOW (ref >40)
HDL: 31 mg/dL — ABNORMAL LOW (ref >40)
LDL Cholesterol: 91 mg/dL (ref 0–99)
LDL Cholesterol: 105 mg/dL — ABNORMAL HIGH (ref 0–99)
Total CHOL/HDL Ratio: 4.8 RATIO
Triglycerides: 204 mg/dL — ABNORMAL HIGH (ref <150)
Triglycerides: 101 mg/dL (ref <150)
VLDL: 41 mg/dL — ABNORMAL HIGH (ref 0–40)
VLDL: 20 mg/dL (ref 0–40)

## 2019-02-23 LAB — HEMOGLOBIN A1C
HEMOGLOBIN A1C: 6 % — AB (ref 4.8–5.6)
Hgb A1c MFr Bld: 6 % — ABNORMAL HIGH (ref 4.8–5.6)
MEAN PLASMA GLUCOSE: 125.5 mg/dL
Mean Plasma Glucose: 125.5 mg/dL

## 2019-02-23 LAB — TROPONIN I
Troponin I: <0.03 ng/mL (ref <0.03)
Troponin I: <0.03 ng/mL (ref <0.03)
Troponin I: <0.03 ng/mL (ref <0.03)

## 2019-02-23 LAB — ABO/RH: ABO/RH(D): O POS

## 2019-02-23 LAB — POCT ACTIVATED CLOTTING TIME
Activated Clotting Time: 235 seconds
Activated Clotting Time: 230 seconds
Activated Clotting Time: 450 seconds

## 2019-02-23 LAB — HEMOGLOBIN AND HEMATOCRIT, BLOOD
HCT: 28.3 % — ABNORMAL LOW (ref 39.0–52.0)
Hemoglobin: 9.3 g/dL — ABNORMAL LOW (ref 13.0–17.0)

## 2019-02-23 LAB — PREPARE RBC (CROSSMATCH)

## 2019-02-23 LAB — FIBRINOGEN: Fibrinogen: 177 mg/dL — ABNORMAL LOW (ref 210–475)

## 2019-02-23 LAB — APTT: aPTT: >200 seconds (ref 24–36)

## 2019-02-23 LAB — PROTIME-INR
INR: 1.3 — ABNORMAL HIGH (ref 0.8–1.2)
Prothrombin Time: 15.7 seconds — ABNORMAL HIGH (ref 11.4–15.2)

## 2019-02-23 SURGERY — LEFT HEART CATH AND CORONARY ANGIOGRAPHY
Anesthesia: LOCAL

## 2019-02-23 SURGERY — REPAIR, AORTIC DISSECTION, ASCENDING
Anesthesia: General | Site: Chest

## 2019-02-23 MED ORDER — LIDOCAINE 2% (20 MG/ML) 5 ML SYRINGE
INTRAMUSCULAR | Status: DC | PRN
  Administered 2019-02-23: 40 mg via INTRAVENOUS

## 2019-02-23 MED ORDER — PROPOFOL 10 MG/ML IV BOLUS
INTRAVENOUS | Status: DC | PRN
  Administered 2019-02-23: 150 mg via INTRAVENOUS
  Administered 2019-02-23: 50 mg via INTRAVENOUS
  Administered 2019-02-24: 60 mg via INTRAVENOUS

## 2019-02-23 MED ORDER — VECURONIUM BROMIDE 10 MG IV SOLR
INTRAVENOUS | Status: DC | PRN
  Administered 2019-02-23: 10 mg via INTRAVENOUS
  Administered 2019-02-23 (×3): 5 mg via INTRAVENOUS

## 2019-02-23 MED ORDER — 0.9 % SODIUM CHLORIDE (POUR BTL) OPTIME
TOPICAL | Status: DC | PRN
  Administered 2019-02-23: 6000 mL
  Administered 2019-02-24: 1000 mL

## 2019-02-23 MED ORDER — EPHEDRINE 5 MG/ML INJ
INTRAVENOUS | Status: AC
  Filled 2019-02-23: qty 10

## 2019-02-23 MED ORDER — TEMAZEPAM 15 MG PO CAPS: 15.0000 mg | ORAL_CAPSULE | Freq: Once | ORAL | Status: DC | PRN

## 2019-02-23 MED ORDER — ROCURONIUM BROMIDE 50 MG/5ML IV SOSY
PREFILLED_SYRINGE | INTRAVENOUS | Status: AC
  Filled 2019-02-23: qty 5

## 2019-02-23 MED ORDER — HEPARIN (PORCINE) IN NACL 1000-0.9 UT/500ML-% IV SOLN
INTRAVENOUS | Status: AC
  Filled 2019-02-23: qty 1000

## 2019-02-23 MED ORDER — NITROGLYCERIN IN D5W 200-5 MCG/ML-% IV SOLN
INTRAVENOUS | Status: AC
  Filled 2019-02-23: qty 250

## 2019-02-23 MED ORDER — DOPAMINE-DEXTROSE 3.2-5 MG/ML-% IV SOLN
0.0000 ug/kg/min | INTRAVENOUS | Status: DC
  Filled 2019-02-23: qty 250

## 2019-02-23 MED ORDER — HEPARIN SODIUM (PORCINE) 1000 UNIT/ML IJ SOLN
INTRAMUSCULAR | Status: AC
  Filled 2019-02-23: qty 1

## 2019-02-23 MED ORDER — SUCCINYLCHOLINE CHLORIDE 200 MG/10ML IV SOSY
PREFILLED_SYRINGE | INTRAVENOUS | Status: AC
  Filled 2019-02-23: qty 10

## 2019-02-23 MED ORDER — INSULIN REGULAR(HUMAN) IN NACL 100-0.9 UT/100ML-% IV SOLN
INTRAVENOUS | Status: AC
  Administered 2019-02-23: 1.4 [IU]/h via INTRAVENOUS
  Filled 2019-02-23: qty 100

## 2019-02-23 MED ORDER — ATORVASTATIN CALCIUM 40 MG PO TABS
40.0000 mg | ORAL_TABLET | Freq: Every day | ORAL | Status: DC
  Administered 2019-02-24 – 2019-03-03 (×8): 40 mg via ORAL
  Filled 2019-02-23 (×8): qty 1

## 2019-02-23 MED ORDER — MILRINONE LACTATE IN DEXTROSE 20-5 MG/100ML-% IV SOLN
0.3000 ug/kg/min | INTRAVENOUS | Status: DC
  Filled 2019-02-23: qty 100

## 2019-02-23 MED ORDER — VANCOMYCIN HCL 10 G IV SOLR
1500.0000 mg | INTRAVENOUS | Status: DC
  Filled 2019-02-23: qty 1500

## 2019-02-23 MED ORDER — LIDOCAINE 2% (20 MG/ML) 5 ML SYRINGE
INTRAMUSCULAR | Status: AC
  Filled 2019-02-23: qty 5

## 2019-02-23 MED ORDER — HEMOSTATIC AGENTS (NO CHARGE) OPTIME
TOPICAL | Status: DC | PRN
  Administered 2019-02-23 – 2019-02-24 (×11): 1 via TOPICAL

## 2019-02-23 MED ORDER — TICAGRELOR 90 MG PO TABS
ORAL_TABLET | ORAL | Status: DC | PRN
  Administered 2019-02-23: 180 mg via ORAL

## 2019-02-23 MED ORDER — FENTANYL CITRATE (PF) 100 MCG/2ML IJ SOLN
INTRAMUSCULAR | Status: AC
  Filled 2019-02-23: qty 2

## 2019-02-23 MED ORDER — DEXMEDETOMIDINE HCL IN NACL 400 MCG/100ML IV SOLN
0.1000 ug/kg/h | INTRAVENOUS | Status: DC
  Filled 2019-02-23: qty 100

## 2019-02-23 MED ORDER — FENTANYL CITRATE (PF) 250 MCG/5ML IJ SOLN
INTRAMUSCULAR | Status: AC
  Filled 2019-02-23: qty 5

## 2019-02-23 MED ORDER — ETOMIDATE 2 MG/ML IV SOLN
INTRAVENOUS | Status: DC | PRN
  Administered 2019-02-23: 14 mg via INTRAVENOUS

## 2019-02-23 MED ORDER — CHLORHEXIDINE GLUCONATE 0.12 % MT SOLN
15.0000 mL | Freq: Once | OROMUCOSAL | Status: DC
  Filled 2019-02-23: qty 15

## 2019-02-23 MED ORDER — PLASMA-LYTE 148 IV SOLN
INTRAVENOUS | Status: DC
  Filled 2019-02-23: qty 2.5

## 2019-02-23 MED ORDER — SODIUM CHLORIDE 0.9 % IV SOLN: INTRAVENOUS | Status: DC

## 2019-02-23 MED ORDER — METOPROLOL TARTRATE 12.5 MG HALF TABLET: 12.5000 mg | ORAL_TABLET | Freq: Once | ORAL | Status: DC

## 2019-02-23 MED ORDER — BISACODYL 5 MG PO TBEC: 5.0000 mg | DELAYED_RELEASE_TABLET | Freq: Once | ORAL | Status: DC

## 2019-02-23 MED ORDER — TRANEXAMIC ACID (OHS) PUMP PRIME SOLUTION
2.0000 mg/kg | INTRAVENOUS | Status: DC
  Filled 2019-02-23: qty 2.2

## 2019-02-23 MED ORDER — NOREPINEPHRINE BITARTRATE 1 MG/ML IV SOLN
0.0000 ug/min | INTRAVENOUS | Status: DC
  Filled 2019-02-23: qty 4

## 2019-02-23 MED ORDER — HYDROCORTISONE NA SUCCINATE PF 100 MG IJ SOLR
INTRAMUSCULAR | Status: DC | PRN
  Administered 2019-02-23: 125 mg via INTRAVENOUS

## 2019-02-23 MED ORDER — MIDAZOLAM HCL (PF) 10 MG/2ML IJ SOLN
INTRAMUSCULAR | Status: AC
  Filled 2019-02-23: qty 2

## 2019-02-23 MED ORDER — MAGNESIUM SULFATE 50 % IJ SOLN
40.0000 meq | INTRAMUSCULAR | Status: DC
  Filled 2019-02-23: qty 9.85

## 2019-02-23 MED ORDER — NOREPINEPHRINE 4 MG/250ML-% IV SOLN
0.0000 ug/min | INTRAVENOUS | Status: AC
  Administered 2019-02-23: 4 ug/min via INTRAVENOUS
  Filled 2019-02-23: qty 250

## 2019-02-23 MED ORDER — ASPIRIN EC 81 MG PO TBEC: 81.0000 mg | DELAYED_RELEASE_TABLET | Freq: Every day | ORAL | Status: DC

## 2019-02-23 MED ORDER — TICAGRELOR 90 MG PO TABS
ORAL_TABLET | ORAL | Status: AC
  Filled 2019-02-23: qty 1

## 2019-02-23 MED ORDER — PHENYLEPHRINE HCL-NACL 20-0.9 MG/250ML-% IV SOLN
30.0000 ug/min | INTRAVENOUS | Status: DC
  Filled 2019-02-23: qty 250

## 2019-02-23 MED ORDER — TRANEXAMIC ACID 1000 MG/10ML IV SOLN
1.5000 mg/kg/h | INTRAVENOUS | Status: DC
  Filled 2019-02-23: qty 25

## 2019-02-23 MED ORDER — PROPOFOL 10 MG/ML IV BOLUS
INTRAVENOUS | Status: AC
  Filled 2019-02-23: qty 20

## 2019-02-23 MED ORDER — HEPARIN (PORCINE) IN NACL 1000-0.9 UT/500ML-% IV SOLN
INTRAVENOUS | Status: DC | PRN
  Administered 2019-02-23 (×2): 500 mL

## 2019-02-23 MED ORDER — SODIUM CHLORIDE 0.9 % IV SOLN
INTRAVENOUS | Status: DC
  Filled 2019-02-23: qty 30

## 2019-02-23 MED ORDER — PLASMA-LYTE 148 IV SOLN
INTRAVENOUS | Status: AC
  Administered 2019-02-23: 500 mL
  Filled 2019-02-23: qty 2.5

## 2019-02-23 MED ORDER — IOHEXOL 350 MG/ML SOLN
INTRAVENOUS | Status: DC | PRN
  Administered 2019-02-23: 165 mL via INTRA_ARTERIAL

## 2019-02-23 MED ORDER — MIDAZOLAM HCL 2 MG/2ML IJ SOLN
INTRAMUSCULAR | Status: AC
  Filled 2019-02-23: qty 2

## 2019-02-23 MED ORDER — DOPAMINE-DEXTROSE 3.2-5 MG/ML-% IV SOLN: 0.0000 ug/kg/min | INTRAVENOUS | Status: DC

## 2019-02-23 MED ORDER — COAGULATION FACTOR VIIA RECOMB 1 MG IV SOLR
45.0000 ug/kg | Freq: Once | INTRAVENOUS | Status: AC
  Administered 2019-02-23 – 2019-02-24 (×2): 5000 ug via INTRAVENOUS
  Filled 2019-02-23: qty 5

## 2019-02-23 MED ORDER — LACTATED RINGERS IV SOLN
INTRAVENOUS | Status: DC | PRN
  Administered 2019-02-23 – 2019-02-24 (×5): via INTRAVENOUS

## 2019-02-23 MED ORDER — IOPAMIDOL (ISOVUE-370) INJECTION 76%
100.0000 mL | Freq: Once | INTRAVENOUS | Status: AC | PRN
  Administered 2019-02-23: 100 mL via INTRAVENOUS

## 2019-02-23 MED ORDER — NITROGLYCERIN 1 MG/10 ML FOR IR/CATH LAB
INTRA_ARTERIAL | Status: AC
  Filled 2019-02-23: qty 10

## 2019-02-23 MED ORDER — MILRINONE LACTATE IN DEXTROSE 20-5 MG/100ML-% IV SOLN
0.3000 ug/kg/min | INTRAVENOUS | Status: AC
  Administered 2019-02-23: 0.125 ug/kg/min via INTRAVENOUS
  Filled 2019-02-23: qty 100

## 2019-02-23 MED ORDER — FENTANYL CITRATE (PF) 100 MCG/2ML IJ SOLN
INTRAMUSCULAR | Status: DC | PRN
  Administered 2019-02-23 (×2): 150 ug via INTRAVENOUS
  Administered 2019-02-23 (×3): 100 ug via INTRAVENOUS
  Administered 2019-02-23: 150 ug via INTRAVENOUS
  Administered 2019-02-23: 250 ug via INTRAVENOUS
  Administered 2019-02-24 (×2): 100 ug via INTRAVENOUS
  Administered 2019-02-24: 50 ug via INTRAVENOUS

## 2019-02-23 MED ORDER — POTASSIUM CHLORIDE 2 MEQ/ML IV SOLN
80.0000 meq | INTRAVENOUS | Status: DC
  Filled 2019-02-23: qty 40

## 2019-02-23 MED ORDER — NITROGLYCERIN IN D5W 200-5 MCG/ML-% IV SOLN
INTRAVENOUS | Status: AC | PRN
  Administered 2019-02-23: 5 ug/min via INTRAVENOUS

## 2019-02-23 MED ORDER — NITROGLYCERIN 1 MG/10 ML FOR IR/CATH LAB
INTRA_ARTERIAL | Status: DC | PRN
  Administered 2019-02-23 (×2): 200 ug via INTRACORONARY

## 2019-02-23 MED ORDER — LEVOFLOXACIN IN D5W 500 MG/100ML IV SOLN
500.0000 mg | INTRAVENOUS | Status: DC
  Filled 2019-02-23: qty 100

## 2019-02-23 MED ORDER — ETOMIDATE 2 MG/ML IV SOLN
INTRAVENOUS | Status: AC
  Filled 2019-02-23: qty 10

## 2019-02-23 MED ORDER — LIDOCAINE HCL (PF) 1 % IJ SOLN
INTRAMUSCULAR | Status: DC | PRN
  Administered 2019-02-23: 2 mL via SUBCUTANEOUS

## 2019-02-23 MED ORDER — VECURONIUM BROMIDE 10 MG IV SOLR
INTRAVENOUS | Status: AC
  Filled 2019-02-23: qty 10

## 2019-02-23 MED ORDER — DEXMEDETOMIDINE HCL IN NACL 400 MCG/100ML IV SOLN
0.1000 ug/kg/h | INTRAVENOUS | Status: AC
  Administered 2019-02-23: .5 ug/kg/h via INTRAVENOUS
  Administered 2019-02-24: 03:00:00 via INTRAVENOUS
  Filled 2019-02-23 (×2): qty 100

## 2019-02-23 MED ORDER — FENTANYL CITRATE (PF) 100 MCG/2ML IJ SOLN
INTRAMUSCULAR | Status: DC | PRN
  Administered 2019-02-23 (×3): 25 ug via INTRAVENOUS

## 2019-02-23 MED ORDER — EPINEPHRINE PF 1 MG/ML IJ SOLN
0.0000 ug/min | INTRAVENOUS | Status: DC
  Filled 2019-02-23: qty 4

## 2019-02-23 MED ORDER — SODIUM CHLORIDE 0.9 % IV SOLN
INTRAVENOUS | Status: DC | PRN
  Administered 2019-02-23 – 2019-02-24 (×3): via INTRAVENOUS

## 2019-02-23 MED ORDER — HEPARIN SODIUM (PORCINE) 1000 UNIT/ML IJ SOLN
INTRAMUSCULAR | Status: DC | PRN
  Administered 2019-02-23: 4000 [IU] via INTRAVENOUS
  Administered 2019-02-23: 35000 [IU] via INTRAVENOUS
  Administered 2019-02-24: 39000 [IU] via INTRAVENOUS

## 2019-02-23 MED ORDER — MIDAZOLAM HCL 2 MG/2ML IJ SOLN
INTRAMUSCULAR | Status: DC | PRN
  Administered 2019-02-23 (×2): 1 mg via INTRAVENOUS

## 2019-02-23 MED ORDER — VERAPAMIL HCL 2.5 MG/ML IV SOLN
INTRAVENOUS | Status: AC
  Filled 2019-02-23: qty 2

## 2019-02-23 MED ORDER — PHENYLEPHRINE HCL-NACL 20-0.9 MG/250ML-% IV SOLN
30.0000 ug/min | INTRAVENOUS | Status: AC
  Administered 2019-02-23: 10 ug/min via INTRAVENOUS
  Filled 2019-02-23: qty 250

## 2019-02-23 MED ORDER — HYDROCORTISONE NA SUCCINATE PF 250 MG IJ SOLR
INTRAMUSCULAR | Status: AC
  Filled 2019-02-23: qty 250

## 2019-02-23 MED ORDER — ROCURONIUM BROMIDE 10 MG/ML (PF) SYRINGE
PREFILLED_SYRINGE | INTRAVENOUS | Status: DC | PRN
  Administered 2019-02-23 – 2019-02-24 (×6): 50 mg via INTRAVENOUS

## 2019-02-23 MED ORDER — INSULIN REGULAR(HUMAN) IN NACL 100-0.9 UT/100ML-% IV SOLN
INTRAVENOUS | Status: DC
  Filled 2019-02-23: qty 100

## 2019-02-23 MED ORDER — NITROGLYCERIN IN D5W 200-5 MCG/ML-% IV SOLN
2.0000 ug/min | INTRAVENOUS | Status: DC
  Filled 2019-02-23: qty 250

## 2019-02-23 MED ORDER — HEPARIN SODIUM (PORCINE) 1000 UNIT/ML IJ SOLN
INTRAMUSCULAR | Status: DC | PRN
  Administered 2019-02-23: 5500 [IU] via INTRAVENOUS
  Administered 2019-02-23: 3000 [IU] via INTRAVENOUS
  Administered 2019-02-23: 4000 [IU] via INTRAVENOUS

## 2019-02-23 MED ORDER — LIDOCAINE HCL (PF) 1 % IJ SOLN
INTRAMUSCULAR | Status: AC
  Filled 2019-02-23: qty 30

## 2019-02-23 MED ORDER — TRANEXAMIC ACID 1000 MG/10ML IV SOLN
1.5000 mg/kg/h | INTRAVENOUS | Status: AC
  Administered 2019-02-23: 1.5 mg/kg/h via INTRAVENOUS
  Administered 2019-02-24: 02:00:00 via INTRAVENOUS
  Filled 2019-02-23: qty 25

## 2019-02-23 MED ORDER — LEVOFLOXACIN IN D5W 500 MG/100ML IV SOLN
500.0000 mg | Freq: Once | INTRAVENOUS | Status: AC
  Administered 2019-02-23: 500 mg via INTRAVENOUS
  Filled 2019-02-23: qty 100

## 2019-02-23 MED ORDER — TRANEXAMIC ACID (OHS) BOLUS VIA INFUSION
15.0000 mg/kg | INTRAVENOUS | Status: AC
  Administered 2019-02-23: 1653 mg via INTRAVENOUS
  Filled 2019-02-23: qty 1653

## 2019-02-23 MED ORDER — VERAPAMIL HCL 2.5 MG/ML IV SOLN
INTRAVENOUS | Status: DC | PRN
  Administered 2019-02-23: 10 mL via INTRA_ARTERIAL

## 2019-02-23 MED ORDER — PHENYLEPHRINE 40 MCG/ML (10ML) SYRINGE FOR IV PUSH (FOR BLOOD PRESSURE SUPPORT)
PREFILLED_SYRINGE | INTRAVENOUS | Status: DC | PRN
  Administered 2019-02-23 (×3): 40 ug via INTRAVENOUS

## 2019-02-23 MED ORDER — PROTAMINE SULFATE 10 MG/ML IV SOLN
INTRAVENOUS | Status: DC | PRN
  Administered 2019-02-23 – 2019-02-24 (×3): 35 mg via INTRAVENOUS

## 2019-02-23 MED ORDER — VANCOMYCIN HCL 10 G IV SOLR
1500.0000 mg | INTRAVENOUS | Status: AC
  Administered 2019-02-23: 1500 mg via INTRAVENOUS
  Filled 2019-02-23: qty 1500

## 2019-02-23 MED ORDER — PHENYLEPHRINE 40 MCG/ML (10ML) SYRINGE FOR IV PUSH (FOR BLOOD PRESSURE SUPPORT)
PREFILLED_SYRINGE | INTRAVENOUS | Status: AC
  Filled 2019-02-23: qty 10

## 2019-02-23 MED ORDER — ALBUMIN HUMAN 5 % IV SOLN
INTRAVENOUS | Status: DC | PRN
  Administered 2019-02-23 – 2019-02-24 (×3): via INTRAVENOUS

## 2019-02-23 MED ORDER — TRANEXAMIC ACID (OHS) BOLUS VIA INFUSION
15.0000 mg/kg | INTRAVENOUS | Status: DC
  Filled 2019-02-23: qty 1653

## 2019-02-23 MED ORDER — SODIUM CHLORIDE (PF) 0.9 % IJ SOLN
OROMUCOSAL | Status: DC | PRN
  Administered 2019-02-23 – 2019-02-24 (×6): 4 mL via TOPICAL

## 2019-02-23 MED ORDER — NITROGLYCERIN IN D5W 200-5 MCG/ML-% IV SOLN
2.0000 ug/min | INTRAVENOUS | Status: AC
  Administered 2019-02-23: 16.6 ug/min via INTRAVENOUS
  Filled 2019-02-23: qty 250

## 2019-02-23 MED ORDER — CHLORHEXIDINE GLUCONATE CLOTH 2 % EX PADS: 6.0000 | MEDICATED_PAD | Freq: Once | CUTANEOUS | Status: DC

## 2019-02-23 MED ORDER — CHLORHEXIDINE GLUCONATE CLOTH 2 % EX PADS
6.0000 | MEDICATED_PAD | Freq: Once | CUTANEOUS | Status: DC
  Administered 2019-02-24: 6 via TOPICAL

## 2019-02-23 MED ORDER — MIDAZOLAM HCL (PF) 5 MG/ML IJ SOLN
INTRAMUSCULAR | Status: DC | PRN
  Administered 2019-02-23: 3 mg via INTRAVENOUS
  Administered 2019-02-23: 5 mg via INTRAVENOUS
  Administered 2019-02-23: 2 mg via INTRAVENOUS

## 2019-02-23 SURGICAL SUPPLY — 163 items
ADAPTER CARDIO PERF ANTE/RETRO (ADAPTER) ×4 IMPLANT
APPLICATOR TIP BIOGLUE STANDRD (MISCELLANEOUS) ×4 IMPLANT
APPLICATOR TIP COSEAL (VASCULAR PRODUCTS) IMPLANT
ATTRACTOMAT 16X20 MAGNETIC DRP (DRAPES) ×4 IMPLANT
BAG DECANTER FOR FLEXI CONT (MISCELLANEOUS) ×8 IMPLANT
BANDAGE ACE 4X5 VEL STRL LF (GAUZE/BANDAGES/DRESSINGS) IMPLANT
BANDAGE ACE 6X5 VEL STRL LF (GAUZE/BANDAGES/DRESSINGS) IMPLANT
BANDAGE ELASTIC 4 VELCRO ST LF (GAUZE/BANDAGES/DRESSINGS) ×4 IMPLANT
BANDAGE ELASTIC 6 VELCRO ST LF (GAUZE/BANDAGES/DRESSINGS) ×4 IMPLANT
BANDAGE HEMOSTAT MRDH 4X4 STRL (MISCELLANEOUS) ×4 IMPLANT
BASKET HEART  (ORDER IN 25'S) (MISCELLANEOUS) ×1
BASKET HEART (ORDER IN 25'S) (MISCELLANEOUS) ×1
BASKET HEART (ORDER IN 25S) (MISCELLANEOUS) ×2 IMPLANT
BLADE CLIPPER SURG (BLADE) ×4 IMPLANT
BLADE STERNUM SYSTEM 6 (BLADE) ×4 IMPLANT
BLADE SURG 12 STRL SS (BLADE) ×4 IMPLANT
BLADE SURG 15 STRL LF DISP TIS (BLADE) ×2 IMPLANT
BLADE SURG 15 STRL SS (BLADE) ×2
BLANKET WARM CARDIAC ADLT BAIR (MISCELLANEOUS) ×4 IMPLANT
BNDG GAUZE ELAST 4 BULKY (GAUZE/BANDAGES/DRESSINGS) ×4 IMPLANT
BNDG HEMOSTAT MRDH 4X4 STRL (MISCELLANEOUS) ×8
CANISTER SUCT 3000ML PPV (MISCELLANEOUS) ×4 IMPLANT
CANNULA GUNDRY RCSP 15FR (MISCELLANEOUS) ×4 IMPLANT
CANNULA MC2 2 STG 36/46 NON-V (CANNULA) ×2 IMPLANT
CANNULA OPTISITE PERFUSION 20F (CANNULA) IMPLANT
CANNULA SNGLE STGE 40FR VENOUS ×4 IMPLANT
CANNULA SUMP PERICARDIAL (CANNULA) ×4 IMPLANT
CANNULA VENOUS 2 STG 34/46 (CANNULA) ×2
CATH CPB KIT VANTRIGT (MISCELLANEOUS) ×4 IMPLANT
CATH FOLEY 2WAY SLVR 18FR 30CC (CATHETERS) ×4 IMPLANT
CATH HEART VENT LEFT (CATHETERS) ×2 IMPLANT
CATH ROBINSON RED A/P 18FR (CATHETERS) ×8 IMPLANT
CATH THORACIC 28FR RT ANG (CATHETERS) ×8 IMPLANT
CATH THORACIC 36FR (CATHETERS) ×4 IMPLANT
CATH THORACIC 36FR RT ANG (CATHETERS) ×8 IMPLANT
CATH/SQUID NICHOLS JEHLE COR (CATHETERS) ×4 IMPLANT
CAUTERY HIGH TEMP VAS (MISCELLANEOUS) ×4 IMPLANT
CLIP VESOCCLUDE MED 24/CT (CLIP) ×4 IMPLANT
CLIP VESOCCLUDE SM WIDE 24/CT (CLIP) ×8 IMPLANT
CONN ST 1/4X3/8  BEN (MISCELLANEOUS) ×6
CONN ST 1/4X3/8 BEN (MISCELLANEOUS) ×6 IMPLANT
CONT SPEC 4OZ CLIKSEAL STRL BL (MISCELLANEOUS) ×12 IMPLANT
COVER SURGICAL LIGHT HANDLE (MISCELLANEOUS) ×4 IMPLANT
COVER WAND RF STERILE (DRAPES) ×4 IMPLANT
DRAIN CHANNEL 32F RND 10.7 FF (WOUND CARE) ×4 IMPLANT
DRAPE CARDIOVASCULAR INCISE (DRAPES) ×2
DRAPE SLUSH/WARMER DISC (DRAPES) ×4 IMPLANT
DRAPE SRG 135X102X78XABS (DRAPES) ×2 IMPLANT
DRSG AQUACEL AG ADV 3.5X 6 (GAUZE/BANDAGES/DRESSINGS) ×4 IMPLANT
DRSG AQUACEL AG ADV 3.5X14 (GAUZE/BANDAGES/DRESSINGS) ×4 IMPLANT
DRSG COVADERM 4X14 (GAUZE/BANDAGES/DRESSINGS) IMPLANT
DRSG COVADERM 4X6 (GAUZE/BANDAGES/DRESSINGS) ×4 IMPLANT
ELECT BLADE 4.0 EZ CLEAN MEGAD (MISCELLANEOUS) ×4
ELECT BLADE 6.5 EXT (BLADE) ×4 IMPLANT
ELECT CAUTERY BLADE 6.4 (BLADE) ×4 IMPLANT
ELECT REM PT RETURN 9FT ADLT (ELECTROSURGICAL) ×8
ELECTRODE BLDE 4.0 EZ CLN MEGD (MISCELLANEOUS) ×2 IMPLANT
ELECTRODE REM PT RTRN 9FT ADLT (ELECTROSURGICAL) ×4 IMPLANT
FELT TEFLON 1X6 (MISCELLANEOUS) ×4 IMPLANT
FELT TEFLON 6X6 (MISCELLANEOUS) ×4 IMPLANT
GAUZE SPONGE 4X4 12PLY STRL (GAUZE/BANDAGES/DRESSINGS) ×8 IMPLANT
GAUZE SPONGE 4X4 12PLY STRL LF (GAUZE/BANDAGES/DRESSINGS) ×16 IMPLANT
GLOVE BIO SURGEON STRL SZ 6 (GLOVE) IMPLANT
GLOVE BIO SURGEON STRL SZ 6.5 (GLOVE) IMPLANT
GLOVE BIO SURGEON STRL SZ7 (GLOVE) IMPLANT
GLOVE BIO SURGEON STRL SZ7.5 (GLOVE) ×12 IMPLANT
GLOVE BIO SURGEONS STRL SZ 6.5 (GLOVE)
GLOVE EUDERMIC 7 POWDERFREE (GLOVE) ×4 IMPLANT
GOWN STRL REUS W/ TWL LRG LVL3 (GOWN DISPOSABLE) ×20 IMPLANT
GOWN STRL REUS W/ TWL XL LVL3 (GOWN DISPOSABLE) ×2 IMPLANT
GOWN STRL REUS W/TWL LRG LVL3 (GOWN DISPOSABLE) ×20
GOWN STRL REUS W/TWL XL LVL3 (GOWN DISPOSABLE) ×2
GRAFT CV 30X8WVN NDL (Graft) ×4 IMPLANT
GRAFT HEMASHIELD 8MM (Graft) ×4 IMPLANT
GRAFT WOVEN D/V 26DX30L (Vascular Products) ×4 IMPLANT
HANDLE STAPLE ENDO GIA SHORT (STAPLE) ×2
HEMOSTAT POWDER SURGIFOAM 1G (HEMOSTASIS) ×16 IMPLANT
HEMOSTAT SURGICEL 2X14 (HEMOSTASIS) ×8 IMPLANT
INSERT FOGARTY SM (MISCELLANEOUS) ×4 IMPLANT
INSERT FOGARTY XLG (MISCELLANEOUS) ×4 IMPLANT
KIT BASIN OR (CUSTOM PROCEDURE TRAY) ×8 IMPLANT
KIT CATH CPB BARTLE (MISCELLANEOUS) IMPLANT
KIT DILATOR VASC 18G NDL (KITS) ×4 IMPLANT
KIT REMOVER STAPLE SKIN (MISCELLANEOUS) ×4 IMPLANT
KIT SUCTION CATH 14FR (SUCTIONS) ×12 IMPLANT
KIT TURNOVER KIT B (KITS) ×4 IMPLANT
KIT VASOVIEW HEMOPRO 2 VH 4000 (KITS) ×4 IMPLANT
LEAD PACING MYOCARDI (MISCELLANEOUS) ×4 IMPLANT
LINE VENT (MISCELLANEOUS) ×4 IMPLANT
LOOP VESSEL MAXI BLUE (MISCELLANEOUS) ×4 IMPLANT
LOOP VESSEL MINI RED (MISCELLANEOUS) ×4 IMPLANT
LOOP VESSEL SUPERMAXI WHITE (MISCELLANEOUS) ×4 IMPLANT
MARKER GRAFT CORONARY BYPASS (MISCELLANEOUS) ×12 IMPLANT
MARKER SKIN DUAL TIP RULER LAB (MISCELLANEOUS) ×4 IMPLANT
NS IRRIG 1000ML POUR BTL (IV SOLUTION) ×20 IMPLANT
PACK E OPEN HEART (SUTURE) ×4 IMPLANT
PACK OPEN HEART (CUSTOM PROCEDURE TRAY) ×4 IMPLANT
PAD ARMBOARD 7.5X6 YLW CONV (MISCELLANEOUS) ×16 IMPLANT
PAD ELECT DEFIB RADIOL ZOLL (MISCELLANEOUS) ×4 IMPLANT
PENCIL BUTTON HOLSTER BLD 10FT (ELECTRODE) ×4 IMPLANT
POWDER SURGICEL 3.0 GRAM (HEMOSTASIS) ×12 IMPLANT
PUNCH AORTIC ROTATE 4.0MM (MISCELLANEOUS) IMPLANT
PUNCH AORTIC ROTATE 4.5MM 8IN (MISCELLANEOUS) ×4 IMPLANT
PUNCH AORTIC ROTATE 5MM 8IN (MISCELLANEOUS) IMPLANT
RELOAD TRI 2.0 30 VAS MED SUL (STAPLE) ×8 IMPLANT
SEALANT SURG COSEAL 8ML (VASCULAR PRODUCTS) ×8 IMPLANT
SET CARDIOPLEGIA MPS 5001102 (MISCELLANEOUS) ×4 IMPLANT
SHEATH PINNACLE 5F 10CM (SHEATH) ×4 IMPLANT
SPONGE INTESTINAL PEANUT (DISPOSABLE) ×4 IMPLANT
SPONGE LAP 4X18 RFD (DISPOSABLE) ×4 IMPLANT
STAPLER ENDO GIA 12MM SHORT (STAPLE) ×2 IMPLANT
STOPCOCK 4 WAY LG BORE MALE ST (IV SETS) ×4 IMPLANT
SURGIFLO W/THROMBIN 8M KIT (HEMOSTASIS) ×8 IMPLANT
SUT BONE WAX W31G (SUTURE) ×8 IMPLANT
SUT ETHIBON 2 0 V 52N 30 (SUTURE) IMPLANT
SUT ETHIBON EXCEL 2-0 V-5 (SUTURE) IMPLANT
SUT ETHIBOND V-5 VALVE (SUTURE) IMPLANT
SUT MNCRL AB 4-0 PS2 18 (SUTURE) IMPLANT
SUT PROLENE 3 0 RB 1 (SUTURE) ×4 IMPLANT
SUT PROLENE 3 0 SH 1 (SUTURE) IMPLANT
SUT PROLENE 3 0 SH DA (SUTURE) ×32 IMPLANT
SUT PROLENE 3 0 SH1 36 (SUTURE) IMPLANT
SUT PROLENE 4 0 RB 1 (SUTURE) ×52
SUT PROLENE 4 0 SH DA (SUTURE) ×24 IMPLANT
SUT PROLENE 4-0 RB1 .5 CRCL 36 (SUTURE) ×52 IMPLANT
SUT PROLENE 5 0 C 1 36 (SUTURE) ×32 IMPLANT
SUT PROLENE 5 0 RB 2 (SUTURE) ×8 IMPLANT
SUT PROLENE 6 0 C 1 30 (SUTURE) IMPLANT
SUT PROLENE 6 0 CC (SUTURE) ×16 IMPLANT
SUT PROLENE 8 0 BV175 6 (SUTURE) ×8 IMPLANT
SUT PROLENE BLUE 7 0 (SUTURE) ×4 IMPLANT
SUT SILK  1 MH (SUTURE) ×14
SUT SILK 1 MH (SUTURE) ×14 IMPLANT
SUT SILK 1 TIES 10X30 (SUTURE) ×4 IMPLANT
SUT SILK 2 0 SH CR/8 (SUTURE) ×12 IMPLANT
SUT SILK 2 0 TIES 10X30 (SUTURE) IMPLANT
SUT SILK 3 0 SH CR/8 (SUTURE) ×8 IMPLANT
SUT SILK 4 0 TIE 10X30 (SUTURE) IMPLANT
SUT STEEL 6MS V (SUTURE) ×4 IMPLANT
SUT STEEL STERNAL CCS#1 18IN (SUTURE) IMPLANT
SUT STEEL SZ 6 DBL 3X14 BALL (SUTURE) ×8 IMPLANT
SUT VIC AB 1 CTX 18 (SUTURE) ×8 IMPLANT
SUT VIC AB 1 CTX 36 (SUTURE)
SUT VIC AB 1 CTX36XBRD ANBCTR (SUTURE) IMPLANT
SUT VIC AB 2-0 CT1 27 (SUTURE) ×2
SUT VIC AB 2-0 CT1 TAPERPNT 27 (SUTURE) ×2 IMPLANT
SUT VIC AB 2-0 CTX 27 (SUTURE) ×8 IMPLANT
SUT VIC AB 2-0 CTX 36 (SUTURE) ×8 IMPLANT
SUT VIC AB 3-0 X1 27 (SUTURE) ×8 IMPLANT
SYR 10ML KIT SKIN ADHESIVE (MISCELLANEOUS) ×4 IMPLANT
SYSTEM SAHARA CHEST DRAIN ATS (WOUND CARE) ×8 IMPLANT
TAPE CLOTH SURG 4X10 WHT LF (GAUZE/BANDAGES/DRESSINGS) ×12 IMPLANT
TAPE PAPER 2X10 WHT MICROPORE (GAUZE/BANDAGES/DRESSINGS) ×4 IMPLANT
TOWEL GREEN STERILE (TOWEL DISPOSABLE) ×12 IMPLANT
TOWEL GREEN STERILE FF (TOWEL DISPOSABLE) ×8 IMPLANT
TRAY FOLEY SLVR 16FR TEMP STAT (SET/KITS/TRAYS/PACK) ×4 IMPLANT
TUBING ART PRESS 48 MALE/FEM (TUBING) ×8 IMPLANT
TUBING LAP HI FLOW INSUFFLATIO (TUBING) ×4 IMPLANT
UNDERPAD 30X30 (UNDERPADS AND DIAPERS) ×4 IMPLANT
VENT LEFT HEART 12002 (CATHETERS) ×4
WATER STERILE IRR 1000ML POUR (IV SOLUTION) ×8 IMPLANT
WIRE EMERALD 3MM-J .035X150CM (WIRE) ×4 IMPLANT
YANKAUER SUCT BULB TIP NO VENT (SUCTIONS) ×4 IMPLANT

## 2019-02-23 SURGICAL SUPPLY — 19 items
BALLN SAPPHIRE 2.0X12 (BALLOONS) ×2
BALLN SAPPHIRE ~~LOC~~ 2.75X12 (BALLOONS) ×2 IMPLANT
BALLOON SAPPHIRE 2.0X12 (BALLOONS) ×1 IMPLANT
CATH 5FR JL3.5 JR4 ANG PIG MP (CATHETERS) ×2 IMPLANT
CATH LAUNCHER 6FR JR4 (CATHETERS) ×2 IMPLANT
DEVICE RAD COMP TR BAND LRG (VASCULAR PRODUCTS) ×2 IMPLANT
ELECT DEFIB PAD ADLT CADENCE (PAD) ×2 IMPLANT
GLIDESHEATH SLEND SS 6F .021 (SHEATH) ×2 IMPLANT
GUIDEWIRE INQWIRE 1.5J.035X260 (WIRE) ×1 IMPLANT
INQWIRE 1.5J .035X260CM (WIRE) ×2
KIT ENCORE 26 ADVANTAGE (KITS) ×2 IMPLANT
KIT HEART LEFT (KITS) ×2 IMPLANT
PACK CARDIAC CATHETERIZATION (CUSTOM PROCEDURE TRAY) ×2 IMPLANT
STENT SYNERGY DES 2.5X16 (Permanent Stent) ×2 IMPLANT
STENT SYNERGY DES 2.75X12 (Permanent Stent) ×2 IMPLANT
SYR MEDRAD MARK 7 150ML (SYRINGE) ×2 IMPLANT
TRANSDUCER W/STOPCOCK (MISCELLANEOUS) ×2 IMPLANT
TUBING CIL FLEX 10 FLL-RA (TUBING) ×2 IMPLANT
WIRE ASAHI PROWATER 180CM (WIRE) ×2 IMPLANT

## 2019-02-23 NOTE — Consult Note (Signed)
[address]       [place] [address]             [phone]        Kuzey H Reining [place] Medical Record #[id number] Date of Birth: 1961-07-08  Referring: Dr. Martinique  primary Care: Shirline Frees, MD Primary Cardiologist:No primary care provider on file.  Chief Complaint:    Chief Complaint  Patient presents with  . Shortness of Breath  Patient examined, images of coronary angiogram and CTA of thoracic aorta personally reviewed.  Patient discussed with Dr. Martinique in the Cath Lab area for coordination of care.  History of Present Illness:     58 year old nondiabetic with heavy smoking history and recent symptoms of dyspnea on exertion with chest tightness consistent with unstable angina.  He was admitted to [facility] and cardiac enzymes are negative. Marland Kitchen  He was recommended for coronary arteriography and was transferred to this hospital.  He underwent cardiac cath this afternoon and was found to have a 90% proximal RCA stenosis, otherwise no significant coronary stenoses.  He underwent PCI with a drug-eluting stent with good result however on the last injection a tear in the ascending aorta in the region of the right coronary ostium was noted.  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  Patient remained hemodynamically stable without significant chest pain.  I evaluated the patient in the Cath Lab holding area and I recommended emergency repair of the type a aortic dissection with a probable vascular graft and possible bypass grafting to the RCA using saphenous vein.  I have discussed the procedure the patient and his family in the Cath Lab holding area and they understand the reason for the surgery and the associated risks without surgery and the risks with surgery including the risks of stroke, bleeding, infection, organ failure, prolonged ventilator  dependence, and death.  The patient received a loading dose of Brilinta 180 mg and the patient and family understand he will need blood transfusion therapy to treat postoperative bleeding.   Current Activity/ Functional Status: Patient is retired from working at a [employer] He is disabled from low back pain and has had low back surgery.  He lives with his family he stopped smoking 1 to 3 packs/day 2 to 3 months ago. Zubrod Score: At the time of surgery this patient's most appropriate activity status/level should be described as: []     0    Normal activity, no symptoms [x]     1    Restricted in physical strenuous activity but ambulatory, able to do out light work [x]     2    Ambulatory and capable of self care, unable to do work activities, up and about                 more than 50%  Of the time                            []     3    Only limited self care, in bed greater than 50% of waking hours []     4    Completely disabled, no self care, confined to bed or chair []     5    Moribund  Past Medical History:  Diagnosis Date  . Adenomatous colon polyp 2009  .  Alcohol abuse    abstinent since 1992  . Arthritis    back  . Asthma   . Barrett's esophagus 05/22/2015  . Chronic back pain   . COPD (chronic obstructive pulmonary disease) ([place])   . Coronary artery disease    a. cath in 2010 showing 20% distal LM, 70-80% Ost LAD, 30% OM, and 40% RCA b. low-risk NST in 2014 and 2016  . GERD (gastroesophageal reflux disease)    takes Nexium daily  . Gout    takes ALlopurinol daily  . Hemorrhoids   . History of bronchitis 2015  . History of kidney stones   . Hyperlipidemia    was given a script a month ago but scared to take it.Medical Md is aware  . Hypertension    takes Losartan,Metoprolol,and Amlodipine daily  . Joint pain   . Lymphocytic colitis 2009  . Myocardial infarction [facility]) 2010   "light" (admit for CP 11/2009; ruled out for MI but had 70-80% ostail LAD stenosis and treated  medically following NL perfusion study)  . NASH (nonalcoholic steatohepatitis)   . Peripheral vascular disease ([place])   . Personal history of colonic polyps 07/20/2002   Diminutive adenomas (3) 06/2002 diminutive adenoma (1) 2011   . Pre-diabetes   . Stroke ([place])   . TIA (transient ischemic attack)   . Urinary urgency     Past Surgical History:  Procedure Laterality Date  . ANEURYSM COILING  12/2010   cerebral  . BAND HEMORRHOIDECTOMY  2003   at sigmoidoscopy  . CARDIAC CATHETERIZATION  2010  . CHOLECYSTECTOMY  08/03/2015  . CHOLECYSTECTOMY N/A 08/03/2015   Procedure: LAPAROSCOPIC CHOLECYSTECTOMY WITH INTRAOPERATIVE CHOLANGIOGRAM;  Surgeon: Fanny Skates, MD;  Location: [facility];  Service: General;  Laterality: N/A;  . COLONOSCOPY  multiple  . ESOPHAGOGASTRODUODENOSCOPY    . KNEE ARTHROSCOPY WITH MEDIAL MENISECTOMY Left 05/20/2017   Procedure: LEFT KNEE ARTHROSCOPY WITH PARTIAL MEDIAL MENISCECTOMY;  Surgeon: Leandrew Koyanagi, MD;  Location: [facility];  Service: Orthopedics;  Laterality: Left;  . LUMBAR SPINE SURGERY     x 2  . NASAL SINUS SURGERY    . TEE WITHOUT CARDIOVERSION  05/28/2012   Procedure: TRANSESOPHAGEAL ECHOCARDIOGRAM (TEE);  Surgeon: Lelon Perla, MD;  Location: [facility] ENDOSCOPY;  Service: Cardiovascular;  Laterality: N/A;    Social History   Tobacco Use  Smoking Status Former Smoker  . Packs/day: 1.00  . Years: 4.00  . Pack years: 4.00  . Types: Cigarettes  . Last attempt to quit: 12/22/2018  . Years since quitting: 0.1  Smokeless Tobacco Never Used    Social History   Substance and Sexual Activity  Alcohol Use No  . Alcohol/week: 0.0 standard drinks   Comment: no alcohol in 56yr      Allergies  Allergen Reactions  . Penicillins Hives and Rash    Did it involve swelling of the face/tongue/throat, SOB, or low BP? Yes Did it involve sudden or severe rash/hives, skin peeling, or any reaction on the inside of your mouth or nose? Yes  Did you  need to seek medical attention at a hospital or doctor's office? Yes When did it last happen?As a child. If all above answers are "NO", may proceed with cephalosporin use.     Current Facility-Administered Medications  Medication Dose Route Frequency Provider Last Rate Last Dose  . 0.9 %  sodium chloride infusion   Intravenous Continuous Strader, BFransisco Hertz PA-C      . [MAR Hold] acetaminophen (TYLENOL)  tablet 650 mg  650 mg Oral Q4H PRN Barton Dubois, MD      . Doug Sou Hold] allopurinol (ZYLOPRIM) tablet 100 mg  100 mg Oral q morning - 10a Barton Dubois, MD   100 mg at 02/23/19 0935  . [MAR Hold] amLODipine (NORVASC) tablet 10 mg  10 mg Oral q morning - 10a Barton Dubois, MD   10 mg at 02/23/19 0934  . [MAR Hold] aspirin EC tablet 81 mg  81 mg Oral Daily Strader, [place] M, [place]      . [MAR Hold] atorvastatin (LIPITOR) tablet 40 mg  40 mg Oral q1800 Strader, [place] M, PA-C      . bisacodyl (DULCOLAX) EC tablet 5 mg  5 mg Oral Once Prescott Gum, Collier Salina, MD      . Doug Sou Hold] budesonide (PULMICORT) nebulizer solution 0.5 mg  0.5 mg Nebulization BID Barton Dubois, MD   0.5 mg at 02/23/19 0744  . [START ON 02/24/2019] chlorhexidine (PERIDEX) 0.12 % solution 15 mL  15 mL Mouth/Throat Once Prescott Gum, Collier Salina, MD      . Chlorhexidine Gluconate Cloth 2 % PADS 6 each  6 each Topical Once Ivin Poot, MD       And  . Chlorhexidine Gluconate Cloth 2 % PADS 6 each  6 each Topical Once Prescott Gum, Collier Salina, MD      . dexmedetomidine (PRECEDEX) 400 MCG/100ML (4 mcg/mL) infusion  0.1-0.7 mcg/kg/hr Intravenous To OR Prescott Gum, Collier Salina, MD      . DOPamine (INTROPIN) 800 mg in dextrose 5 % 250 mL (3.2 mg/mL) infusion  0-10 mcg/kg/min Intravenous To OR Prescott Gum, Collier Salina, MD      . EPINEPHrine (ADRENALIN) 4 mg in dextrose 5 % 250 mL (0.016 mg/mL) infusion  0-10 mcg/min Intravenous To OR Prescott Gum, Collier Salina, MD      . heparin 2,500 Units, papaverine 30 mg in electrolyte-148 (PLASMALYTE-148) 500 mL irrigation    Irrigation To OR Prescott Gum, Collier Salina, MD      . heparin 30,000 units/NS 1000 mL solution for CELLSAVER   Other To OR Prescott Gum, Collier Salina, MD      . Doug Sou Hold] heparin injection 5,000 Units  5,000 Units Subcutaneous Q8H Barton Dubois, MD   5,000 Units at 02/23/19 0631  . insulin regular, human (MYXREDLIN) 100 units/ 100 mL infusion   Intravenous To OR Prescott Gum, Collier Salina, MD      . Doug Sou Hold] ipratropium-albuterol (DUONEB) 0.5-2.5 (3) MG/3ML nebulizer solution 3 mL  3 mL Nebulization Q8H PRN Barton Dubois, MD   3 mL at 02/22/19 1946  . levofloxacin (LEVAQUIN) IVPB 500 mg  500 mg Intravenous Once Prescott Gum, Collier Salina, MD      . Doug Sou Hold] losartan (COZAAR) tablet 100 mg  100 mg Oral Daily Barton Dubois, MD   100 mg at 02/23/19 0932  . magnesium sulfate (IV Push/IM) injection 40 mEq  40 mEq Other To OR Prescott Gum, Collier Salina, MD      . Doug Sou Hold] metoprolol tartrate (LOPRESSOR) tablet 100 mg  100 mg Oral BID Barton Dubois, MD   100 mg at 02/23/19 9562  . [START ON 02/24/2019] metoprolol tartrate (LOPRESSOR) tablet 12.5 mg  12.5 mg Oral Once Prescott Gum, Collier Salina, MD      . milrinone (PRIMACOR) 20 MG/100 ML (0.2 mg/mL) infusion  0.3 mcg/kg/min Intravenous To OR Prescott Gum, Collier Salina, MD      . Doug Sou Hold] morphine 2 MG/ML injection 2 mg  2 mg Intravenous Q4H PRN Barton Dubois,  MD      . Doug Sou Hold] multivitamin with minerals tablet 1 tablet  1 tablet Oral Daily Barton Dubois, MD   1 tablet at 02/23/19 0932  . [MAR Hold] nitroGLYCERIN (NITROSTAT) SL tablet 0.4 mg  0.4 mg Sublingual Q5 min PRN Barton Dubois, MD      . nitroGLYCERIN 50 mg in dextrose 5 % 250 mL (0.2 mg/mL) infusion  2-200 mcg/min Intravenous To OR Prescott Gum, Collier Salina, MD      . Doug Sou Hold] omega-3 acid ethyl esters (LOVAZA) capsule 1,000 mg  1,000 mg Oral Daily Barton Dubois, MD   1,000 mg at 02/23/19 0932  . [MAR Hold] ondansetron (ZOFRAN) injection 4 mg  4 mg Intravenous Q6H PRN Barton Dubois, MD      . Doug Sou Hold] pantoprazole (PROTONIX) EC tablet 40 mg  40 mg  Oral BID Barton Dubois, MD   40 mg at 02/23/19 0935  . phenylephrine (NEOSYNEPHRINE) 20-0.9 MG/250ML-% infusion  30-200 mcg/min Intravenous To OR Prescott Gum, Collier Salina, MD      . potassium chloride injection 80 mEq  80 mEq Other To OR Prescott Gum, Collier Salina, MD      . Doug Sou Hold] predniSONE (DELTASONE) tablet 40 mg  40 mg Oral Q breakfast Barton Dubois, MD   40 mg at 02/23/19 5621  . temazepam (RESTORIL) capsule 15 mg  15 mg Oral Once PRN Prescott Gum, Collier Salina, MD      . tranexamic acid (CYKLOKAPRON) 2,500 mg in sodium chloride 0.9 % 250 mL (10 mg/mL) infusion  1.5 mg/kg/hr Intravenous To OR Prescott Gum, Collier Salina, MD      . tranexamic acid (CYKLOKAPRON) bolus via infusion - over 30 minutes 1,653 mg  15 mg/kg Intravenous To OR Prescott Gum, Collier Salina, MD      . tranexamic acid (CYKLOKAPRON) pump prime solution 220 mg  2 mg/kg Intracatheter To OR Prescott Gum, Collier Salina, MD      . Doug Sou Hold] traZODone (DESYREL) tablet 100 mg  100 mg Oral QHS PRN Barton Dubois, MD   100 mg at 02/22/19 2212  . vancomycin (VANCOCIN) 1,500 mg in sodium chloride 0.9 % 250 mL IVPB  1,500 mg Intravenous To OR Ivin Poot, MD        Medications Prior to Admission  Medication Sig Dispense Refill Last Dose  . albuterol (PROVENTIL HFA;VENTOLIN HFA) 108 (90 Base) MCG/ACT inhaler Inhale 1 puff into the lungs every 6 (six) hours as needed for wheezing or shortness of breath.    02/21/2019 at Unknown time  . allopurinol (ZYLOPRIM) 100 MG tablet Take 100 mg by mouth every morning.    02/22/2019 at Unknown time  . amLODipine (NORVASC) 10 MG tablet Take 10 mg by mouth every morning.    02/21/2019 at Unknown time  . aspirin 325 MG tablet Take 325 mg by mouth every morning.    02/22/2019 at Unknown time  . esomeprazole (NEXIUM) 40 MG capsule Take 40 mg by mouth 2 (two) times daily.   02/22/2019 at Unknown time  . HYDROcodone-acetaminophen (NORCO/VICODIN) 5-325 MG tablet Take 1-2 tablets by mouth 2 (two) times daily as needed for moderate pain or severe pain.     02/22/2019 at Unknown time  . ipratropium-albuterol (DUONEB) 0.5-2.5 (3) MG/3ML SOLN Take 3 mLs by nebulization every 8 (eight) hours as needed (wheezing, shortness of breath). 360 mL 0 02/22/2019 at Unknown time  . losartan (COZAAR) 100 MG tablet Take 100 mg by mouth every morning.    02/21/2019 at Unknown time  .  metoprolol (LOPRESSOR) 100 MG tablet Take 100 mg by mouth 2 (two) times daily.   02/22/2019 at 0700  . Milk Thistle 500 MG CAPS Take 1,000 mg by mouth daily.    02/22/2019 at Unknown time  . Multiple Vitamin (MULITIVITAMIN WITH MINERALS) TABS Take 1 tablet by mouth daily.   02/22/2019 at Unknown time  . naproxen (NAPROSYN) 500 MG tablet TAKE 1 TABLET BY MOUTH ONCE DAILY WITH FOOD OR MILK AS NEEDED FOR PAIN FOR 30 DAYS   02/21/2019 at Unknown time  . niacin (NIASPAN) 1000 MG CR tablet Take 1,000 mg by mouth at bedtime.   02/21/2019 at Unknown time  . nitroGLYCERIN (NITROSTAT) 0.4 MG SL tablet Place 0.4 mg under the tongue every 5 (five) minutes as needed. For chest pain   unknown  . Omega-3 Fatty Acids (FISH OIL) 1200 MG CPDR Take 1,200 mg by mouth daily.   02/22/2019 at Unknown time  . traZODone (DESYREL) 100 MG tablet Take 100 mg by mouth at bedtime.    02/21/2019 at Unknown time    Family History  Problem Relation Age of Onset  . Liver disease Mother   . Liver cancer Mother   . Breast cancer Mother   . Cancer Mother   . Hypertension Mother   . Heart attack Mother   . Cancer Father   . Heart disease Father        before age 44  . Hyperlipidemia Father   . Hypertension Father   . Heart attack Father   . Heart disease Other        multiple family members on maternal and paternal side of family  . Diabetes Sister   . Hyperlipidemia Sister   . Hypertension Sister   . Diabetes Paternal Grandmother   . Hypertension Brother   . Colon cancer Neg Hx      Review of Systems:   ROS     Cardiac Review of Systems: Y or  [    ]= no  Chest Pain [  y  ]  Resting SOB [   ] Exertional SOB   [ y ]  Orthopnea [  ]   Pedal Edema [   ]    Palpitations [  ] Syncope  [  ]   Presyncope [   ]  General Review of Systems: [Y] = yes [  ]=no Constitional: recent weight change [  ]; anorexia [  ]; fatigue [ y ]; nausea [  ]; night sweats [  ]; fever [  ]; or chills [  ]                                                               Dental: Last Dentist visit:   Eye : blurred vision [  ]; diplopia [   ]; vision changes [  ];  Amaurosis fugax[  ]; Resp: cough [  ];  wheezing[  ];  hemoptysis[  ]; shortness of breath[  ]; paroxysmal nocturnal dyspnea[  ]; dyspnea on exertion[y  ]; or orthopnea[  ];  GI:  gallstones[y  ], vomiting[  ];  dysphagia[  ]; melena[  ];  hematochezia [  ]; heartburn[  ];   Hx of  Colonoscopy[y  ]; GU: kidney stones [  ];  hematuria[  ];   dysuria [  ];  nocturia[  ];  history of     obstruction [  ]; urinary frequency [  ]             Skin: rash, swelling[  ];, hair loss[  ];  peripheral edema[  ];  or itching[  ]; Musculosketetal: myalgias[  ];  joint swelling[  ];  joint erythema[  ];  joint pain[  ];  back pain[y  ];  Heme/Lymph: bruising[  ];  bleeding[  ];  anemia[  ];  Neuro: TIA[  ];  headaches[  ];  stroke[ y ];  vertigo[  ];  seizures[  ];   paresthesias[  ];  difficulty walking[  ];  Psych:depression[  ]; anxiety[  ];  Endocrine: diabetes[  ];  thyroid dysfunction[  ];                   }      Physical Exam: BP 116/64   Pulse 67   Temp 99.4 F (37.4 C) (Oral)   Resp 18   Ht 5' 8"  (1.727 m)   Wt 110.2 kg   SpO2 97%   BMI 36.95 kg/m   General- obese middle-aged male in Cath Lab holding area following cardiac catheterization accompanied by family  HEENT- multiple missing teeth in the maxilla, 4 remaining teeth in the mandible in poor repair.  No carotid bruit or JVD Lungs- scattered rhonchi Cardiac-regular rhythm without murmur or gallop Abdomen-soft obese, no pulsatile mass Extremities- introducer in right wrist for cardiac catheterization, 1+  bilateral pedal edema.  Peripheral pulses 1-2+ Neuro- mental status intact no focal motor deficit   Diagnostic Studies & Laboratory data:     Recent Radiology Findings:   Dg Chest 2 View  Result Date: 02/22/2019 CLINICAL DATA:  Shortness of breath. EXAM: CHEST - 2 VIEW COMPARISON:  01/18/2019. FINDINGS: Mediastinum and hilar structures normal. Stable cardiomegaly with normal pulmonary vascularity. Mild bilateral interstitial prominence. Pneumonitis could present this fashion. No pleural effusion or pneumothorax. No acute bony abnormality. IMPRESSION: 1. Mild bilateral pulmonary interstitial prominence. Pneumonitis could present this fashion. 2.  Stable cardiomegaly.  Normal pulmonary vascularity. Electronically Signed   By: Marcello Moores  Register   On: 02/22/2019 12:21     I have independently reviewed the above radiologic studies and discussed with the patient   Recent Lab Findings: Lab Results  Component Value Date   WBC 8.1 02/23/2019   HGB 12.9 (L) 02/23/2019   HCT 38.9 (L) 02/23/2019   PLT 223 02/23/2019   GLUCOSE 129 (H) 02/23/2019   CHOL 156 02/23/2019   TRIG 101 02/23/2019   HDL 31 (L) 02/23/2019   LDLCALC 105 (H) 02/23/2019   ALT 39 02/23/2019   AST 28 02/23/2019   NA 131 (L) 02/23/2019   K 3.9 02/23/2019   CL 101 02/23/2019   CREATININE 0.74 02/23/2019   BUN 10 02/23/2019   CO2 20 (L) 02/23/2019   INR 1.3 (H) 02/23/2019   HGBA1C 6.0 (H) 02/23/2019      Assessment / Plan:   Unstable angina status post PCI to the 90% lesion in the proximal RCA Hypertension COPD with heavy smoking history History of stroke  After reviewing the CTA showing a type a dissection and discussing the patient with Dr. Martinique I recommend that we proceed with emergency repair of the type a ascending aortic dissection and possible bypass grafting of the RCA.  I discussed this operation with the  patient and family.  They understand the reasons for the surgery as well as the risks potential  complications associated with surgery including stroke bleeding organ failure prolonged ventilator dependence and death.     @ME1 @ 02/23/2019 3:27 PM

## 2019-02-23 NOTE — Anesthesia Procedure Notes (Signed)
Central Venous Catheter Insertion Performed by: Effie Berkshire, MD, anesthesiologist Start/End2/26/2020 3:50 PM, 02/23/2019 4:00 PM Patient location: Pre-op. Preanesthetic checklist: patient identified, IV checked, site marked, risks and benefits discussed, surgical consent, monitors and equipment checked, pre-op evaluation, timeout performed and anesthesia consent Position: Trendelenburg Lidocaine 1% used for infiltration and patient sedated Hand hygiene performed , maximum sterile barriers used  and Seldinger technique used Catheter size: 9 Fr Total catheter length 10. Central line was placed.Sheath introducer Swan type:thermodilution PA Cath depth:50 Procedure performed using ultrasound guided technique. Ultrasound Notes:anatomy identified, needle tip was noted to be adjacent to the nerve/plexus identified, no ultrasound evidence of intravascular and/or intraneural injection and image(s) printed for medical record Attempts: 1 Following insertion, line sutured and dressing applied. Post procedure assessment: blood return through all ports, free fluid flow and no air  Patient tolerated the procedure well with no immediate complications.

## 2019-02-23 NOTE — Anesthesia Procedure Notes (Signed)
Procedure Name: Intubation Date/Time: 02/23/2019 3:46 PM Performed by: Myna Bright, CRNA Pre-anesthesia Checklist: Patient identified, Emergency Drugs available, Suction available and Patient being monitored Patient Re-evaluated:Patient Re-evaluated prior to induction Oxygen Delivery Method: Circle system utilized Preoxygenation: Pre-oxygenation with 100% oxygen Induction Type: IV induction Ventilation: Mask ventilation without difficulty and Oral airway inserted - appropriate to patient size Laryngoscope Size: Mac and 4 Grade View: Grade I Tube type: Oral Tube size: 7.5 mm Number of attempts: 1 Airway Equipment and Method: Stylet Placement Confirmation: ETT inserted through vocal cords under direct vision,  positive ETCO2 and breath sounds checked- equal and bilateral Secured at: 22 cm Tube secured with: Tape Dental Injury: Teeth and Oropharynx as per pre-operative assessment

## 2019-02-23 NOTE — Anesthesia Preprocedure Evaluation (Addendum)
Anesthesia Evaluation  Patient identified by MRN, date of birth, ID band Patient awake    Reviewed: Allergy & Precautions, NPO status , Patient's Chart, lab work & pertinent test results  Airway Mallampati: I  TM Distance: >3 FB Neck ROM: Full    Dental  (+) Edentulous Upper, Edentulous Lower   Pulmonary asthma , COPD, former smoker,    breath sounds clear to auscultation       Cardiovascular hypertension, + CAD, + Past MI, + Cardiac Stents and + Peripheral Vascular Disease   Rhythm:Regular Rate:Normal     Neuro/Psych TIACVA    GI/Hepatic GERD  ,(+) Hepatitis -, Unspecified  Endo/Other  negative endocrine ROS  Renal/GU negative Renal ROS     Musculoskeletal  (+) Arthritis ,   Abdominal (+) + obese,   Peds  Hematology negative hematology ROS (+)   Anesthesia Other Findings - HLD  Reproductive/Obstetrics                             Dist LM to Ost LAD lesion is 45% stenosed.  Prox Cx to Mid Cx lesion is 30% stenosed.  Prox LAD lesion is 30% stenosed.  Prox RCA lesion is 90% stenosed.  Post intervention, there is a 0% residual stenosis.  A drug-eluting stent was successfully placed using a STENT SYNERGY DES 2.5X16.  A drug-eluting stent was successfully placed using a STENT SYNERGY DES 2.75X12.  The left ventricular systolic function is normal.  LV end diastolic pressure is normal.  The left ventricular ejection fraction is 55-65% by visual estimate.  Anesthesia Physical Anesthesia Plan  ASA: IV and emergent  Anesthesia Plan: General   Post-op Pain Management:    Induction: Intravenous  PONV Risk Score and Plan: Treatment may vary due to age or medical condition  Airway Management Planned: Oral ETT  Additional Equipment: Arterial line, CVP, PA Cath, TEE and Ultrasound Guidance Line Placement  Intra-op Plan: Utilization Of Total Body Hypothermia per surgeon  request  Post-operative Plan: Post-operative intubation/ventilation  Informed Consent: I have reviewed the patients History and Physical, chart, labs and discussed the procedure including the risks, benefits and alternatives for the proposed anesthesia with the patient or authorized representative who has indicated his/her understanding and acceptance.       Plan Discussed with: CRNA  Anesthesia Plan Comments:         Anesthesia Quick Evaluation

## 2019-02-23 NOTE — Progress Notes (Signed)
Dr. Lawson Fiscal spoken w/patient and family. Transferred patient to OR

## 2019-02-23 NOTE — Progress Notes (Signed)
Patient seen and evaluated this morning sitting up at bedside.  He denies any chest pain or shortness of breath and has had multiple repeat negative troponin results.  Cardiology has evaluated patient this morning and plans for transfer to [facility] for cardiac catheterization noted due to prior severe CAD and history concerning for unstable angina.  Patient will be on cardiology service upon transfer.  No further recommendations at this time from IM standpoint.

## 2019-02-23 NOTE — Interval H&P Note (Signed)
History and Physical Interval Note:  02/23/2019 11:27 AM  [name] [name]  has presented today for surgery, with the diagnosis of ua  The various methods of treatment have been discussed with the patient and family. After consideration of risks, benefits and other options for treatment, the patient has consented to  Procedure(s): LEFT HEART CATH AND CORONARY ANGIOGRAPHY (N/A) as a surgical intervention .  The patient's history has been reviewed, patient examined, no change in status, stable for surgery.  I have reviewed the patient's chart and labs.  Questions were answered to the patient's satisfaction.     Collier Salina [facility] 02/23/2019 11:27 AM Cath Lab Visit (complete for each Cath Lab visit)  Clinical Evaluation Leading to the Procedure:   ACS: Yes.    Non-ACS:    Anginal Classification: CCS III  Anti-ischemic medical therapy: Maximal Therapy (2 or more classes of medications)  Non-Invasive Test Results: No non-invasive testing performed  Prior CABG: No previous CABG

## 2019-02-23 NOTE — Anesthesia Procedure Notes (Signed)
Central Venous Catheter Insertion Performed by: Effie Berkshire, MD, anesthesiologist Start/End2/26/2020 4:00 PM, 02/23/2019 4:05 PM Patient location: Pre-op. Preanesthetic checklist: patient identified, IV checked, site marked, risks and benefits discussed, surgical consent, monitors and equipment checked, pre-op evaluation, timeout performed and anesthesia consent Hand hygiene performed  and maximum sterile barriers used  PA cath was placed.Swan type:thermodilution Procedure performed without using ultrasound guided technique. Attempts: 1 Patient tolerated the procedure well with no immediate complications.

## 2019-02-23 NOTE — Anesthesia Procedure Notes (Signed)
Arterial Line Insertion Start/End2/26/2020 3:49 PM, 02/23/2019 3:54 PM Performed by: Myna Bright, CRNA, CRNA  Patient location: OR. Preanesthetic checklist: patient identified, IV checked, site marked, risks and benefits discussed, surgical consent, monitors and equipment checked, pre-op evaluation, timeout performed and anesthesia consent Lidocaine 1% used for infiltration Left, radial was placed Catheter size: 20 Fr Hand hygiene performed  and maximum sterile barriers used   Attempts: 1 Procedure performed without using ultrasound guided technique. Following insertion, dressing applied. Post procedure assessment: normal and unchanged  Patient tolerated the procedure well with no immediate complications.

## 2019-02-23 NOTE — Consult Note (Addendum)
Cardiology Consult    Patient ID: [name]; [id number]; 06-04-1961   Admit date: 02/22/2019 Date of Consult: 02/23/2019  Primary Care Provider: Shirline Frees, MD Primary Cardiologist: Previously followed by Dr. Mare Ferrari; No follow-up since 06/2015 but scheduled to see Dr. Harl Bowie in 02/2019.  Patient Profile    [name] [name] is a 58 y.o. male with past medical history of CAD (cath in 2010 showing 20% distal LM, 70-80% Ost LAD, 30% OM, and 40% RCA, low-risk NST in 2014 and 2016), HTN, HLD, COPD, and former tobacco use (quit 2 months ago but previously smoking 1-3 ppd) who is being seen today for the evaluation of chest pain at the request of Dr. Dyann Kief.   History of Present Illness    [name] reports having baseline dyspnea on exertion in the setting of COPD but reports his symptoms have acutely worsened over the past 3+ months. Has now developed shortness of breath when walking from room to room in his house. He notes an associated chest tightness when this occurs and symptoms resolve with rest. Does not have SL NTG at home. He mentions to me having an episode of chest pain while consuming rice last week but says this felt different from his typical symptoms. Reports having recurrent dyspnea and chest tightness while ambulating in the emergency department last night. Denies any associated orthopnea, PND, lower extremity edema, or palpitations. Does not exercise regularly due to chronic back pain.   Reports he had previously been smoking 1-3 packs per day but quit 2 months ago. No recent alcohol use or recreational drug use.   Initial labs show WBC 6.9, Hgb 14.0, platelets 240, Na+ 138, K+ 3.7, and creatinine 0.69. Initial and cyclic troponin values have been negative thus far. EKG showed NSR, HR 75, with known RBBB. CXR showed mild bilateral pulmonary interstitial prominence, possibly suggestive of pneumonitis. Has been followed on telemetry overnight and has maintained  NSR with no significant arrhythmias.    Past Medical History:  Diagnosis Date  . Adenomatous colon polyp 2009  . Alcohol abuse    abstinent since 1992  . Arthritis    back  . Asthma   . Barrett's esophagus 05/22/2015  . Chronic back pain   . COPD (chronic obstructive pulmonary disease) ([place])   . Coronary artery disease    a. cath in 2010 showing 20% distal LM, 70-80% Ost LAD, 30% OM, and 40% RCA b. low-risk NST in 2014 and 2016  . GERD (gastroesophageal reflux disease)    takes Nexium daily  . Gout    takes ALlopurinol daily  . Hemorrhoids   . History of bronchitis 2015  . History of kidney stones   . Hyperlipidemia    was given a script a month ago but scared to take it.Medical Md is aware  . Hypertension    takes Losartan,Metoprolol,and Amlodipine daily  . Joint pain   . Lymphocytic colitis 2009  . Myocardial infarction [facility]) 2010   "light" (admit for CP 11/2009; ruled out for MI but had 70-80% ostail LAD stenosis and treated medically following NL perfusion study)  . NASH (nonalcoholic steatohepatitis)   . Peripheral vascular disease ([place])   . Personal history of colonic polyps 07/20/2002   Diminutive adenomas (3) 06/2002 diminutive adenoma (1) 2011   . Pre-diabetes   . Stroke ([place])   . TIA (transient ischemic attack)   . Urinary urgency     Past Surgical History:  Procedure Laterality Date  . 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 BAND HEMORRHOIDECTOMY  2003   at sigmoidoscopy  . CARDIAC CATHETERIZATION  2010  . CHOLECYSTECTOMY  08/03/2015  . CHOLECYSTECTOMY N/A 08/03/2015   Procedure: LAPAROSCOPIC CHOLECYSTECTOMY WITH INTRAOPERATIVE CHOLANGIOGRAM;  Surgeon: Fanny Skates, MD;  Location: [place];  Service: General;  Laterality: N/A;  . COLONOSCOPY  multiple  . ESOPHAGOGASTRODUODENOSCOPY    . KNEE ARTHROSCOPY WITH MEDIAL MENISECTOMY Left 05/20/2017   Procedure: LEFT KNEE ARTHROSCOPY WITH PARTIAL MEDIAL MENISCECTOMY;  Surgeon: Leandrew Koyanagi, MD;  Location: [place];  Service: Orthopedics;  Laterality: Left;  . LUMBAR SPINE SURGERY     x 2  . NASAL SINUS SURGERY    . TEE WITHOUT CARDIOVERSION  05/28/2012   Procedure: TRANSESOPHAGEAL ECHOCARDIOGRAM (TEE);  Surgeon: Lelon Perla, MD;  Location[facility] ENDOSCOPY;  Service: Cardiovascular;  Laterality: N/A;     Home Medications:  Prior to Admission medications   Medication Sig Start Date End Date Taking? Authorizing Provider  albuterol (PROVENTIL HFA;VENTOLIN HFA) 108 (90 Base) MCG/ACT inhaler Inhale 1 puff into the lungs every 6 (six) hours as needed for wheezing or shortness of breath.    Yes [provider]  allopurinol (ZYLOPRIM) 100 MG tablet Take 100 mg by mouth every morning.    Yes [provider]  amLODipine (NORVASC) 10 MG tablet Take 10 mg by mouth every morning.    Yes [provider]  aspirin 325 MG tablet Take 325 mg by mouth every morning.    Yes [provider]  esomeprazole (NEXIUM) 40 MG capsule Take 40 mg by mouth 2 (two) times daily.   Yes [provider]  HYDROcodone-acetaminophen (NORCO/VICODIN) 5-325 MG tablet Take 1-2 tablets by mouth 2 (two) times daily as needed for moderate pain or severe pain.  12/23/18  Yes [provider]  ipratropium-albuterol (DUONEB) 0.5-2.5 (3) MG/3ML SOLN Take 3 mLs by nebulization every 8 (eight) hours as needed (wheezing, shortness of breath). 11/29/18  Yes Johnson, Clanford L, MD  losartan (COZAAR) 100 MG tablet Take 100 mg by mouth every morning.    Yes [provider]  metoprolol (LOPRESSOR) 100 MG tablet Take 100 mg by mouth 2 (two) times daily.   Yes [provider]  Milk Thistle 500 MG CAPS Take 1,000 mg by mouth daily.    Yes [provider]  Multiple Vitamin (MULITIVITAMIN WITH MINERALS) TABS Take 1 tablet by mouth daily.   Yes [provider]  naproxen (NAPROSYN) 500 MG tablet TAKE 1 TABLET BY MOUTH ONCE DAILY WITH FOOD OR MILK AS NEEDED FOR PAIN  FOR 30 DAYS 02/03/19  Yes [provider]  niacin (NIASPAN) 1000 MG CR tablet Take 1,000 mg by mouth at bedtime.   Yes [provider]  nitroGLYCERIN (NITROSTAT) 0.4 MG SL tablet Place 0.4 mg under the tongue every 5 (five) minutes as needed. For chest pain   Yes [provider]  Omega-3 Fatty Acids (FISH OIL) 1200 MG CPDR Take 1,200 mg by mouth daily.   Yes [provider]  traZODone (DESYREL) 100 MG tablet Take 100 mg by mouth at bedtime.    Yes [provider]    Inpatient Medications: Scheduled Meds: . allopurinol  100 mg Oral q morning - 10a  . amLODipine  10 mg Oral q morning - 10a  . [START ON 02/24/2019] aspirin EC  81 mg Oral Daily  . atorvastatin  40 mg Oral q1800  . budesonide (PULMICORT) nebulizer solution  0.5 mg Nebulization BID  . heparin  5,000 Units Subcutaneous Q8H  . losartan  100 mg Oral Daily  . metoprolol tartrate  100 mg Oral BID  . multivitamin with minerals  1 tablet Oral Daily  . omega-3 acid ethyl esters  1,000 mg Oral Daily  . pantoprazole  40 mg Oral BID  . predniSONE  40 mg Oral Q breakfast   Continuous Infusions:  PRN Meds: acetaminophen, ipratropium-albuterol, morphine injection, nitroGLYCERIN, ondansetron (ZOFRAN) IV, traZODone  Allergies:    Allergies  Allergen Reactions  . Penicillins Hives and Rash    Did it involve swelling of the face/tongue/throat, SOB, or low BP? Yes Did it involve sudden or severe rash/hives, skin peeling, or any reaction on the inside of your mouth or nose? Yes  Did you need to seek medical attention at a hospital or doctor's office? Yes When did it last happen?As a child. If all above answers are "NO", may proceed with cephalosporin use.     Social History:   Social History   Socioeconomic History  . Marital status: Married    Spouse name: Not on file  . Number of children: 3  . Years of education: Not on file  . Highest education level: Not on file  Occupational  History  . Occupation: Disabled    Fish farm manager: UNEMPLOYED  Social Needs  . Financial resource strain: Not on file  . Food insecurity:    Worry: Not on file    Inability: Not on file  . Transportation needs:    Medical: Not on file    Non-medical: Not on file  Tobacco Use  . Smoking status: Former Smoker    Packs/day: 1.00    Years: 4.00    Pack years: 4.00    Types: Cigarettes    Last attempt to quit: 12/22/2018    Years since quitting: 0.1  . Smokeless tobacco: Never Used  Substance and Sexual Activity  . Alcohol use: No    Alcohol/week: 0.0 standard drinks    Comment: no alcohol in 61yr   . Drug use: No  . Sexual activity: Not on file  Lifestyle  . Physical activity:    Days per week: Not on file    Minutes per session: Not on file  . Stress: Not on file  Relationships  . Social connections:    Talks on phone: Not on file    Gets together: Not on file    Attends religious service: Not on file    Active member of club or organization: Not on file    Attends meetings of clubs or organizations: Not on file    Relationship status: Not on file  . Intimate partner violence:    Fear of current or ex partner: Not on file    Emotionally abused: Not on file    Physically abused: Not on file    Forced sexual activity: Not on file  Other Topics Concern  . Not on file  Social History Narrative  . Not on file     Family History:    Family History  Problem Relation Age of Onset  . Liver disease Mother   . Liver cancer Mother   . Breast cancer Mother   . Cancer Mother   . Hypertension Mother   . Heart attack Mother   . Cancer Father   . Heart disease Father        before age 59 . Hyperlipidemia Father   . Hypertension Father   .  Heart attack Father   . Heart disease Other        multiple family members on maternal and paternal side of family  . Diabetes Sister   . Hyperlipidemia Sister   . Hypertension Sister   . Diabetes Paternal Grandmother   . Hypertension  Brother   . Colon cancer Neg Hx       Review of Systems    General:  No chills, fever, night sweats or weight changes.  Cardiovascular:  No edema, orthopnea, palpitations, paroxysmal nocturnal dyspnea. Positive for chest pain and dyspnea on exertion.  Dermatological: No rash, lesions/masses Respiratory: No cough, dyspnea Urologic: No hematuria, dysuria Abdominal:   No nausea, vomiting, diarrhea, bright red blood per rectum, melena, or hematemesis Neurologic:  No visual changes, wkns, changes in mental status. All other systems reviewed and are otherwise negative except as noted above.  Physical Exam/Data    Vitals:   02/23/19 0010 02/23/19 0401 02/23/19 0744 02/23/19 0823  BP: 107/65 133/64  134/77  Pulse: 82 72  62  Resp: 15 18  18   Temp: 98.1 F (36.7 C) 98.7 F (37.1 C)  99.4 F (37.4 C)  TempSrc: Oral Oral  Oral  SpO2: 97% 93% 97% 97%  Weight:      Height:        Intake/Output Summary (Last 24 hours) at 02/23/2019 1004 Last data filed at 02/23/2019 0700 Gross per 24 hour  Intake 240 ml  Output -  Net 240 ml   Filed Weights   02/22/19 1057 02/22/19 1655  Weight: 103.9 kg 110.2 kg   Body mass index is 36.95 kg/m.   General: Pleasant, Caucasian male appearing in NAD Psych: Normal affect. Neuro: Alert and oriented X 3. Moves all extremities spontaneously. HEENT: Normal  Neck: Supple without bruits or JVD. Lungs:  Resp regular and unlabored, CTA without wheezing or rales. Heart: RRR no s3, s4, or murmurs. Abdomen: Soft, non-tender, non-distended, BS + x 4.  Extremities: No clubbing or cyanosis. 1+ pitting edema up to mid-shins bilaterally. DP/PT/Radials 2+ and equal bilaterally.   EKG:  The EKG was personally reviewed and demonstrates: NSR, HR 75, with known RBBB.   Labs/Studies     Relevant CV Studies:  Cardiac Catheterization: 2010   NST: 06/2015  The left ventricular ejection fraction is normal (55-65%).  The study is normal.  Nuclear stress  EF: 55%.  There is no ischemia.  The LV function is normal   Laboratory Data:  Chemistry Recent Labs  Lab 02/22/19 1230  NA 138  K 3.7  CL 107  CO2 23  GLUCOSE 166*  BUN 13  CREATININE 0.69  CALCIUM 9.3  GFRNONAA >60  GFRAA >60  ANIONGAP 8    Recent Labs  Lab 02/22/19 1230  PROT 6.9  ALBUMIN 4.2  AST 38  ALT 46*  ALKPHOS 80  BILITOT 0.2*   Hematology Recent Labs  Lab 02/22/19 1257  WBC 6.9  RBC 4.87  HGB 14.0  HCT 42.6  MCV 87.5  MCH 28.7  MCHC 32.9  RDW 12.1  PLT 240   Cardiac Enzymes Recent Labs  Lab 02/22/19 1230 02/22/19 2343 02/23/19 0458  TROPONINI <0.03 <0.03 <0.03   No results for input(s): TROPIPOC in the last 168 hours.  BNP Recent Labs  Lab 02/22/19 1230  BNP 50.0    DDimer No results for input(s): DDIMER in the last 168 hours.  Radiology/Studies:  Dg Chest 2 View  Result Date: 02/22/2019 CLINICAL DATA:  Shortness of  breath. EXAM: CHEST - 2 VIEW COMPARISON:  01/18/2019. FINDINGS: Mediastinum and hilar structures normal. Stable cardiomegaly with normal pulmonary vascularity. Mild bilateral interstitial prominence. Pneumonitis could present this fashion. No pleural effusion or pneumothorax. No acute bony abnormality. IMPRESSION: 1. Mild bilateral pulmonary interstitial prominence. Pneumonitis could present this fashion. 2.  Stable cardiomegaly.  Normal pulmonary vascularity. Electronically Signed   By: [place]   On: 02/22/2019 12:21     Assessment & Plan    1. Chest Pain/Dyspnea on Exertion Concerning for Unstable Angina - the patient has known CAD with catheterization 10+ years ago showing 20% distal LM, 70-80% Ost LAD, 30% OM, and 40% RCA stenosis. Last ischemic evaluation was a low-risk NST in 2016. Reports progressive chest tightness and dyspnea on exertion over the past 3+ months but symptoms have acutely worsened over the past 2 weeks. - EKG shows NSR with known RBBB and cyclic enzymes have been negative.  Echocardiogram is pending to assess LV function and wall motion. - given his known CAD by cath 10+ years ago, progressive symptoms, and continued tobacco use until recently, would recommend a cardiac catheterization for definitive evaluation. Will review with Dr. [name]. He did have less than half a cup of coffee this morning but has otherwise been NPO.  - continue ASA (will reduce to 64m daily), BB, and ARB. Will stop Niacin and start Atorvastatin 479mdaily.   2. COPD - CXR on admission was concerning for Pneumonitis. No active wheezing appreciated on examination.  - has been started on Pulmicort and a 5-day course of Prednisone. Continue at the time of transfer. Needs to establish with Pulmonology as an outpatient.   3. HTN - BP has been variable at 107/60 - 162/97 since admission. Continue PTA Amlodipine 1090maily, Losartan 100m17mily, and Lopressor 100mg73m.   4. HLD - FLP this admission shows LDL at 91. Goal is less than 70 with known CAD. Will stop Niacin and start Atorvastatin 40mg 15my.   5. Prior Tobacco Use - was previously smoking 1-3 packs per day but quit 2 months ago. Congratulated on this!   For questions or updates, please contact [facility] consult [email or web address] for contact info under Cardiology/STEMI.  Signed, BrittaErma Heritage 02/23/2019, 10:04 AM Pager: [phone] note:  Patient seen and examined.  I reviewed extensive records and discussed the case with Ms. StradeAhmed Prima  Mr. PatterDumond history of 70 to 80% ostial LAD stenosis and otherwise mild CAD by prior cardiac catheterization in 2010, managed medically at that time.  He has had no regular cardiology follow-up although was actually scheduled to see Dr. BranchHarl Bowierch.  He has a longstanding history of tobacco abuse and COPD, but quit smoking 2 months ago.  He states that he has a typical COPD exacerbation about once a year.  He presents to the hospital now reporting  progressive dyspnea on exertion, worse than typically associated with his COPD, also exertional chest tightness.  This is been going on over the last few months and also associated with generalized fatigue and lack of energy.  When he walks to his mailbox which is 50 feet from his house, he states that he has to stop at least twice.  On examination this morning he appears comfortable, reports no active chest pain.  Systolic blood pressure in the 130s with heart rate in the 60s to 70s in sinus rhythm by telemetry which I personally reviewed.  Lungs exhibit decreased breath  sounds but no active wheezing.  Cardiac exam reveals RRR without gallop.  Lab work shows potassium 3.7, BUN 13, creatinine 0.69, negative troponin I x3, BNP 50, LDL 91, hemoglobin 14.0, platelets 240.  I personally reviewed his ECG which shows sinus rhythm with right bundle branch block.  Chest x-ray reports interstitial prominence.  Patient presents with symptoms concerning for unstable angina, although does have baseline COPD.  No recent cough, fevers or chills.  No active wheezing.  He has known CAD with 13 to 80% ostial LAD stenosis that was managed medically as of 2010, no regular cardiology follow-up since then.  Troponin I levels are negative.  Risks and benefits of diagnostic cardiac catheterization discussed with patient, and he is in agreement to proceed.  He will be transferred to [facility] for further evaluation.  Satira Sark, M.D., F.A.C.C.

## 2019-02-23 NOTE — H&P (View-Only) (Signed)
Cardiology Consult    Patient ID: RICKARDO BRINEGAR; [id number]; 01-Feb-1961   Admit date: 02/22/2019 Date of Consult: 02/23/2019  Primary Care Provider: Shirline Frees, MD Primary Cardiologist: Previously followed by Dr. Mare Ferrari; No follow-up since 06/2015 but scheduled to see Dr. Harl Bowie in 02/2019.  Patient Profile    ZAILYN ROWSER is a 58 y.o. male with past medical history of CAD (cath in 2010 showing 20% distal LM, 70-80% Ost LAD, 30% OM, and 40% RCA, low-risk NST in 2014 and 2016), HTN, HLD, COPD, and former tobacco use (quit 2 months ago but previously smoking 1-3 ppd) who is being seen today for the evaluation of chest pain at the request of Dr. Dyann Kief.   History of Present Illness    Mr. Beach reports having baseline dyspnea on exertion in the setting of COPD but reports his symptoms have acutely worsened over the past 3+ months. Has now developed shortness of breath when walking from room to room in his house. He notes an associated chest tightness when this occurs and symptoms resolve with rest. Does not have SL NTG at home. He mentions to me having an episode of chest pain while consuming rice last week but says this felt different from his typical symptoms. Reports having recurrent dyspnea and chest tightness while ambulating in the emergency department last night. Denies any associated orthopnea, PND, lower extremity edema, or palpitations. Does not exercise regularly due to chronic back pain.   Reports he had previously been smoking 1-3 packs per day but quit 2 months ago. No recent alcohol use or recreational drug use.   Initial labs show WBC 6.9, Hgb 14.0, platelets 240, Na+ 138, K+ 3.7, and creatinine 0.69. Initial and cyclic troponin values have been negative thus far. EKG showed NSR, HR 75, with known RBBB. CXR showed mild bilateral pulmonary interstitial prominence, possibly suggestive of pneumonitis. Has been followed on telemetry overnight and has maintained  NSR with no significant arrhythmias.    Past Medical History:  Diagnosis Date  . Adenomatous colon polyp 2009  . Alcohol abuse    abstinent since 1992  . Arthritis    back  . Asthma   . Barrett's esophagus 05/22/2015  . Chronic back pain   . COPD (chronic obstructive pulmonary disease) ([place])   . Coronary artery disease    a. cath in 2010 showing 20% distal LM, 70-80% Ost LAD, 30% OM, and 40% RCA b. low-risk NST in 2014 and 2016  . GERD (gastroesophageal reflux disease)    takes Nexium daily  . Gout    takes ALlopurinol daily  . Hemorrhoids   . History of bronchitis 2015  . History of kidney stones   . Hyperlipidemia    was given a script a month ago but scared to take it.Medical Md is aware  . Hypertension    takes Losartan,Metoprolol,and Amlodipine daily  . Joint pain   . Lymphocytic colitis 2009  . Myocardial infarction [facility]) 2010   "light" (admit for CP 11/2009; ruled out for MI but had 70-80% ostail LAD stenosis and treated medically following NL perfusion study)  . NASH (nonalcoholic steatohepatitis)   . Peripheral vascular disease ([place])   . Personal history of colonic polyps 07/20/2002   Diminutive adenomas (3) 06/2002 diminutive adenoma (1) 2011   . Pre-diabetes   . Stroke ([place])   . TIA (transient ischemic attack)   . Urinary urgency     Past Surgical History:  Procedure Laterality Date  . 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 BAND HEMORRHOIDECTOMY  2003   at sigmoidoscopy  . CARDIAC CATHETERIZATION  2010  . CHOLECYSTECTOMY  08/03/2015  . CHOLECYSTECTOMY N/A 08/03/2015   Procedure: LAPAROSCOPIC CHOLECYSTECTOMY WITH INTRAOPERATIVE CHOLANGIOGRAM;  Surgeon: Fanny Skates, MD;  Location: [facility];  Service: General;  Laterality: N/A;  . COLONOSCOPY  multiple  . ESOPHAGOGASTRODUODENOSCOPY    . KNEE ARTHROSCOPY WITH MEDIAL MENISECTOMY Left 05/20/2017   Procedure: LEFT KNEE ARTHROSCOPY WITH PARTIAL MEDIAL MENISCECTOMY;  Surgeon: Leandrew Koyanagi, MD;  Location: [facility];  Service: Orthopedics;  Laterality: Left;  . LUMBAR SPINE SURGERY     x 2  . NASAL SINUS SURGERY    . TEE WITHOUT CARDIOVERSION  05/28/2012   Procedure: TRANSESOPHAGEAL ECHOCARDIOGRAM (TEE);  Surgeon: Lelon Perla, MD;  Location: [facility] ENDOSCOPY;  Service: Cardiovascular;  Laterality: N/A;     Home Medications:  Prior to Admission medications   Medication Sig Start Date End Date Taking? Authorizing Provider  albuterol (PROVENTIL HFA;VENTOLIN HFA) 108 (90 Base) MCG/ACT inhaler Inhale 1 puff into the lungs every 6 (six) hours as needed for wheezing or shortness of breath.    Yes [provider]  allopurinol (ZYLOPRIM) 100 MG tablet Take 100 mg by mouth every morning.    Yes [provider]  amLODipine (NORVASC) 10 MG tablet Take 10 mg by mouth every morning.    Yes [provider]  aspirin 325 MG tablet Take 325 mg by mouth every morning.    Yes [provider]  esomeprazole (NEXIUM) 40 MG capsule Take 40 mg by mouth 2 (two) times daily.   Yes [provider]  HYDROcodone-acetaminophen (NORCO/VICODIN) 5-325 MG tablet Take 1-2 tablets by mouth 2 (two) times daily as needed for moderate pain or severe pain.  12/23/18  Yes [provider]  ipratropium-albuterol (DUONEB) 0.5-2.5 (3) MG/3ML SOLN Take 3 mLs by nebulization every 8 (eight) hours as needed (wheezing, shortness of breath). 11/29/18  Yes Johnson, Clanford L, MD  losartan (COZAAR) 100 MG tablet Take 100 mg by mouth every morning.    Yes [provider]  metoprolol (LOPRESSOR) 100 MG tablet Take 100 mg by mouth 2 (two) times daily.   Yes [provider]  Milk Thistle 500 MG CAPS Take 1,000 mg by mouth daily.    Yes [provider]  Multiple Vitamin (MULITIVITAMIN WITH MINERALS) TABS Take 1 tablet by mouth daily.   Yes [provider]  naproxen (NAPROSYN) 500 MG tablet TAKE 1 TABLET BY MOUTH ONCE DAILY WITH FOOD OR MILK AS NEEDED FOR PAIN  FOR 30 DAYS 02/03/19  Yes [provider]  niacin (NIASPAN) 1000 MG CR tablet Take 1,000 mg by mouth at bedtime.   Yes [provider]  nitroGLYCERIN (NITROSTAT) 0.4 MG SL tablet Place 0.4 mg under the tongue every 5 (five) minutes as needed. For chest pain   Yes [provider]  Omega-3 Fatty Acids (FISH OIL) 1200 MG CPDR Take 1,200 mg by mouth daily.   Yes [provider]  traZODone (DESYREL) 100 MG tablet Take 100 mg by mouth at bedtime.    Yes [provider]    Inpatient Medications: Scheduled Meds: . allopurinol  100 mg Oral q morning - 10a  . amLODipine  10 mg Oral q morning - 10a  . [START ON 02/24/2019] aspirin EC  81 mg Oral Daily  . atorvastatin  40 mg Oral q1800  . budesonide (PULMICORT) nebulizer solution  0.5 mg Nebulization BID  . heparin  5,000 Units Subcutaneous Q8H  . losartan  100 mg Oral Daily  . metoprolol tartrate  100 mg Oral BID  . multivitamin with minerals  1 tablet Oral Daily  . omega-3 acid ethyl esters  1,000 mg Oral Daily  . pantoprazole  40 mg Oral BID  . predniSONE  40 mg Oral Q breakfast   Continuous Infusions:  PRN Meds: acetaminophen, ipratropium-albuterol, morphine injection, nitroGLYCERIN, ondansetron (ZOFRAN) IV, traZODone  Allergies:    Allergies  Allergen Reactions  . Penicillins Hives and Rash    Did it involve swelling of the face/tongue/throat, SOB, or low BP? Yes Did it involve sudden or severe rash/hives, skin peeling, or any reaction on the inside of your mouth or nose? Yes  Did you need to seek medical attention at a hospital or doctor's office? Yes When did it last happen?As a child. If all above answers are "NO", may proceed with cephalosporin use.     Social History:   Social History   Socioeconomic History  . Marital status: Married    Spouse name: Not on file  . Number of children: 3  . Years of education: Not on file  . Highest education level: Not on file  Occupational  History  . Occupation: Disabled    Fish farm manager: UNEMPLOYED  Social Needs  . Financial resource strain: Not on file  . Food insecurity:    Worry: Not on file    Inability: Not on file  . Transportation needs:    Medical: Not on file    Non-medical: Not on file  Tobacco Use  . Smoking status: Former Smoker    Packs/day: 1.00    Years: 4.00    Pack years: 4.00    Types: Cigarettes    Last attempt to quit: 12/22/2018    Years since quitting: 0.1  . Smokeless tobacco: Never Used  Substance and Sexual Activity  . Alcohol use: No    Alcohol/week: 0.0 standard drinks    Comment: no alcohol in 26yr   . Drug use: No  . Sexual activity: Not on file  Lifestyle  . Physical activity:    Days per week: Not on file    Minutes per session: Not on file  . Stress: Not on file  Relationships  . Social connections:    Talks on phone: Not on file    Gets together: Not on file    Attends religious service: Not on file    Active member of club or organization: Not on file    Attends meetings of clubs or organizations: Not on file    Relationship status: Not on file  . Intimate partner violence:    Fear of current or ex partner: Not on file    Emotionally abused: Not on file    Physically abused: Not on file    Forced sexual activity: Not on file  Other Topics Concern  . Not on file  Social History Narrative  . Not on file     Family History:    Family History  Problem Relation Age of Onset  . Liver disease Mother   . Liver cancer Mother   . Breast cancer Mother   . Cancer Mother   . Hypertension Mother   . Heart attack Mother   . Cancer Father   . Heart disease Father        before age 59 . Hyperlipidemia Father   . Hypertension Father   .  Heart attack Father   . Heart disease Other        multiple family members on maternal and paternal side of family  . Diabetes Sister   . Hyperlipidemia Sister   . Hypertension Sister   . Diabetes Paternal Grandmother   . Hypertension  Brother   . Colon cancer Neg Hx       Review of Systems    General:  No chills, fever, night sweats or weight changes.  Cardiovascular:  No edema, orthopnea, palpitations, paroxysmal nocturnal dyspnea. Positive for chest pain and dyspnea on exertion.  Dermatological: No rash, lesions/masses Respiratory: No cough, dyspnea Urologic: No hematuria, dysuria Abdominal:   No nausea, vomiting, diarrhea, bright red blood per rectum, melena, or hematemesis Neurologic:  No visual changes, wkns, changes in mental status. All other systems reviewed and are otherwise negative except as noted above.  Physical Exam/Data    Vitals:   02/23/19 0010 02/23/19 0401 02/23/19 0744 02/23/19 0823  BP: 107/65 133/64  134/77  Pulse: 82 72  62  Resp: 15 18  18   Temp: 98.1 F (36.7 C) 98.7 F (37.1 C)  99.4 F (37.4 C)  TempSrc: Oral Oral  Oral  SpO2: 97% 93% 97% 97%  Weight:      Height:        Intake/Output Summary (Last 24 hours) at 02/23/2019 1004 Last data filed at 02/23/2019 0700 Gross per 24 hour  Intake 240 ml  Output -  Net 240 ml   Filed Weights   02/22/19 1057 02/22/19 1655  Weight: 103.9 kg 110.2 kg   Body mass index is 36.95 kg/m.   General: Pleasant, Caucasian male appearing in NAD Psych: Normal affect. Neuro: Alert and oriented X 3. Moves all extremities spontaneously. HEENT: Normal  Neck: Supple without bruits or JVD. Lungs:  Resp regular and unlabored, CTA without wheezing or rales. Heart: RRR no s3, s4, or murmurs. Abdomen: Soft, non-tender, non-distended, BS + x 4.  Extremities: No clubbing or cyanosis. 1+ pitting edema up to mid-shins bilaterally. DP/PT/Radials 2+ and equal bilaterally.   EKG:  The EKG was personally reviewed and demonstrates: NSR, HR 75, with known RBBB.   Labs/Studies     Relevant CV Studies:  Cardiac Catheterization: 2010   NST: 06/2015  The left ventricular ejection fraction is normal (55-65%).  The study is normal.  Nuclear stress  EF: 55%.  There is no ischemia.  The LV function is normal   Laboratory Data:  Chemistry Recent Labs  Lab 02/22/19 1230  NA 138  K 3.7  CL 107  CO2 23  GLUCOSE 166*  BUN 13  CREATININE 0.69  CALCIUM 9.3  GFRNONAA >60  GFRAA >60  ANIONGAP 8    Recent Labs  Lab 02/22/19 1230  PROT 6.9  ALBUMIN 4.2  AST 38  ALT 46*  ALKPHOS 80  BILITOT 0.2*   Hematology Recent Labs  Lab 02/22/19 1257  WBC 6.9  RBC 4.87  HGB 14.0  HCT 42.6  MCV 87.5  MCH 28.7  MCHC 32.9  RDW 12.1  PLT 240   Cardiac Enzymes Recent Labs  Lab 02/22/19 1230 02/22/19 2343 02/23/19 0458  TROPONINI <0.03 <0.03 <0.03   No results for input(s): TROPIPOC in the last 168 hours.  BNP Recent Labs  Lab 02/22/19 1230  BNP 50.0    DDimer No results for input(s): DDIMER in the last 168 hours.  Radiology/Studies:  Dg Chest 2 View  Result Date: 02/22/2019 CLINICAL DATA:  Shortness of  breath. EXAM: CHEST - 2 VIEW COMPARISON:  01/18/2019. FINDINGS: Mediastinum and hilar structures normal. Stable cardiomegaly with normal pulmonary vascularity. Mild bilateral interstitial prominence. Pneumonitis could present this fashion. No pleural effusion or pneumothorax. No acute bony abnormality. IMPRESSION: 1. Mild bilateral pulmonary interstitial prominence. Pneumonitis could present this fashion. 2.  Stable cardiomegaly.  Normal pulmonary vascularity. Electronically Signed   By: [place]   On: 02/22/2019 12:21     Assessment & Plan    1. Chest Pain/Dyspnea on Exertion Concerning for Unstable Angina - the patient has known CAD with catheterization 10+ years ago showing 20% distal LM, 70-80% Ost LAD, 30% OM, and 40% RCA stenosis. Last ischemic evaluation was a low-risk NST in 2016. Reports progressive chest tightness and dyspnea on exertion over the past 3+ months but symptoms have acutely worsened over the past 2 weeks. - EKG shows NSR with known RBBB and cyclic enzymes have been negative.  Echocardiogram is pending to assess LV function and wall motion. - given his known CAD by cath 10+ years ago, progressive symptoms, and continued tobacco use until recently, would recommend a cardiac catheterization for definitive evaluation. Will review with Dr. [name]. He did have less than half a cup of coffee this morning but has otherwise been NPO.  - continue ASA (will reduce to 27m daily), BB, and ARB. Will stop Niacin and start Atorvastatin 411mdaily.   2. COPD - CXR on admission was concerning for Pneumonitis. No active wheezing appreciated on examination.  - has been started on Pulmicort and a 5-day course of Prednisone. Continue at the time of transfer. Needs to establish with Pulmonology as an outpatient.   3. HTN - BP has been variable at 107/60 - 162/97 since admission. Continue PTA Amlodipine 1027maily, Losartan 100m87mily, and Lopressor 100mg53m.   4. HLD - FLP this admission shows LDL at 91. Goal is less than 70 with known CAD. Will stop Niacin and start Atorvastatin 40mg 41my.   5. Prior Tobacco Use - was previously smoking 1-3 packs per day but quit 2 months ago. Congratulated on this!   For questions or updates, please contact [facility] consult [email or web address] for contact info under Cardiology/STEMI.  Signed, BrittaErma Heritage 02/23/2019, 10:04 AM Pager: [phone] note:  Patient seen and examined.  I reviewed extensive records and discussed the case with Ms. StradeAhmed Prima  Mr. PatterLitsey history of 70 to 80% ostial LAD stenosis and otherwise mild CAD by prior cardiac catheterization in 2010, managed medically at that time.  He has had no regular cardiology follow-up although was actually scheduled to see Dr. BranchHarl Bowierch.  He has a longstanding history of tobacco abuse and COPD, but quit smoking 2 months ago.  He states that he has a typical COPD exacerbation about once a year.  He presents to the hospital now reporting  progressive dyspnea on exertion, worse than typically associated with his COPD, also exertional chest tightness.  This is been going on over the last few months and also associated with generalized fatigue and lack of energy.  When he walks to his mailbox which is 50 feet from his house, he states that he has to stop at least twice.  On examination this morning he appears comfortable, reports no active chest pain.  Systolic blood pressure in the 130s with heart rate in the 60s to 70s in sinus rhythm by telemetry which I personally reviewed.  Lungs exhibit decreased breath  sounds but no active wheezing.  Cardiac exam reveals RRR without gallop.  Lab work shows potassium 3.7, BUN 13, creatinine 0.69, negative troponin I x3, BNP 50, LDL 91, hemoglobin 14.0, platelets 240.  I personally reviewed his ECG which shows sinus rhythm with right bundle branch block.  Chest x-ray reports interstitial prominence.  Patient presents with symptoms concerning for unstable angina, although does have baseline COPD.  No recent cough, fevers or chills.  No active wheezing.  He has known CAD with 7 to 80% ostial LAD stenosis that was managed medically as of 2010, no regular cardiology follow-up since then.  Troponin I levels are negative.  Risks and benefits of diagnostic cardiac catheterization discussed with patient, and he is in agreement to proceed.  He will be transferred to [facility] for further evaluation.  Satira Sark, M.D., F.A.C.C.

## 2019-02-23 NOTE — Care Management Obs Status (Signed)
[facility] NOTIFICATION   Patient Details  Name: SERGE MAIN MRN: [id number] Date of Birth: [birth year]   Medicare Observation Status Notification Given:  Yes    Tommy Medal 02/23/2019, 10:27 AM

## 2019-02-23 NOTE — Brief Op Note (Signed)
02/22/2019 - 02/23/2019  10:34 AM  PATIENT:  [name] [name]  58 y.o. 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, using Hemashield Platinum Woven Double Velour Vascular Graft (D: 72m, L: 30cm) (N/A) CORONARY ARTERY BYPASS GRAFTING (CABG) x 1; Using Endoscopically Harvested Right Leg Greater Saphenous Vein Graft (SVG); SVG to RCA (N/A) TRANSESOPHAGEAL ECHOCARDIOGRAM (TEE) (N/A)  SURGEON:  Surgeon(s) and Role: Panel 1:    * VIvin Poot MD - Primary Panel 2:    * VIvin Poot MD - Primary  PHYSICIAN ASSISTANT: Allayna Erlich PA-C  ANESTHESIA:   general  EBL:  6300 mL   BLOOD ADMINISTERED:3 FFP 2 CRYO 2 PLATELETS  DRAINS: ROUTINE PLEURAL AND PERICARDIAL CHEST TUBES   LOCAL MEDICATIONS USED:  NONE  SPECIMEN:  Source of Specimen:  ASCENDING AORTA  DISPOSITION OF SPECIMEN:  PATHOLOGY  COUNTS:  YES  TOURNIQUET:  * No tourniquets in log *  DICTATION: .Other Dictation: Dictation Number PENDING  PLAN OF CARE: Admit to inpatient   PATIENT DISPOSITION:  ICU - intubated and hemodynamically stable.   Delay start of Pharmacological VTE agent (>24hrs) due to surgical blood loss or risk of bleeding: yes

## 2019-02-24 ENCOUNTER — Encounter (HOSPITAL_COMMUNITY): Payer: Self-pay | Admitting: Cardiology

## 2019-02-24 ENCOUNTER — Inpatient Hospital Stay (HOSPITAL_COMMUNITY): Payer: Medicare Other

## 2019-02-24 DIAGNOSIS — Z9889 Other specified postprocedural states: Secondary | ICD-10-CM

## 2019-02-24 DIAGNOSIS — Z8679 Personal history of other diseases of the circulatory system: Secondary | ICD-10-CM

## 2019-02-24 LAB — POCT I-STAT 7, (LYTES, BLD GAS, ICA,H+H)
Acid-Base Excess: 1 mmol/L (ref 0.0–2.0)
Acid-Base Excess: 1 mmol/L (ref 0.0–2.0)
Acid-Base Excess: 1 mmol/L (ref 0.0–2.0)
Acid-base deficit: 2 mmol/L (ref 0.0–2.0)
Acid-base deficit: 3 mmol/L — ABNORMAL HIGH (ref 0.0–2.0)
Acid-base deficit: 3 mmol/L — ABNORMAL HIGH (ref 0.0–2.0)
Acid-base deficit: 3 mmol/L — ABNORMAL HIGH (ref 0.0–2.0)
Acid-base deficit: 4 mmol/L — ABNORMAL HIGH (ref 0.0–2.0)
Acid-base deficit: 5 mmol/L — ABNORMAL HIGH (ref 0.0–2.0)
Acid-base deficit: 5 mmol/L — ABNORMAL HIGH (ref 0.0–2.0)
BICARBONATE: 25.9 mmol/L (ref 20.0–28.0)
Bicarbonate: 21.9 mmol/L (ref 20.0–28.0)
Bicarbonate: 21.9 mmol/L (ref 20.0–28.0)
Bicarbonate: 22.7 mmol/L (ref 20.0–28.0)
Bicarbonate: 23.3 mmol/L (ref 20.0–28.0)
Bicarbonate: 23.7 mmol/L (ref 20.0–28.0)
Bicarbonate: 24.2 mmol/L (ref 20.0–28.0)
Bicarbonate: 24.7 mmol/L (ref 20.0–28.0)
Bicarbonate: 24.9 mmol/L (ref 20.0–28.0)
Bicarbonate: 25.9 mmol/L (ref 20.0–28.0)
Bicarbonate: 26.1 mmol/L (ref 20.0–28.0)
Bicarbonate: 26.5 mmol/L (ref 20.0–28.0)
Bicarbonate: 28.5 mmol/L — ABNORMAL HIGH (ref 20.0–28.0)
CALCIUM ION: 0.95 mmol/L — AB (ref 1.15–1.40)
CALCIUM ION: 0.98 mmol/L — AB (ref 1.15–1.40)
Calcium, Ion: 0.97 mmol/L — ABNORMAL LOW (ref 1.15–1.40)
Calcium, Ion: 0.97 mmol/L — ABNORMAL LOW (ref 1.15–1.40)
Calcium, Ion: 1.05 mmol/L — ABNORMAL LOW (ref 1.15–1.40)
Calcium, Ion: 1.08 mmol/L — ABNORMAL LOW (ref 1.15–1.40)
Calcium, Ion: 1.08 mmol/L — ABNORMAL LOW (ref 1.15–1.40)
Calcium, Ion: 1.08 mmol/L — ABNORMAL LOW (ref 1.15–1.40)
Calcium, Ion: 1.09 mmol/L — ABNORMAL LOW (ref 1.15–1.40)
Calcium, Ion: 1.12 mmol/L — ABNORMAL LOW (ref 1.15–1.40)
Calcium, Ion: 1.13 mmol/L — ABNORMAL LOW (ref 1.15–1.40)
Calcium, Ion: 1.2 mmol/L (ref 1.15–1.40)
Calcium, Ion: 1.25 mmol/L (ref 1.15–1.40)
HCT: 17 % — ABNORMAL LOW (ref 39.0–52.0)
HCT: 24 % — ABNORMAL LOW (ref 39.0–52.0)
HCT: 25 % — ABNORMAL LOW (ref 39.0–52.0)
HCT: 26 % — ABNORMAL LOW (ref 39.0–52.0)
HCT: 28 % — ABNORMAL LOW (ref 39.0–52.0)
HCT: 29 % — ABNORMAL LOW (ref 39.0–52.0)
HCT: 29 % — ABNORMAL LOW (ref 39.0–52.0)
HCT: 30 % — ABNORMAL LOW (ref 39.0–52.0)
HCT: 31 % — ABNORMAL LOW (ref 39.0–52.0)
HCT: 34 % — ABNORMAL LOW (ref 39.0–52.0)
HCT: 39 % (ref 39.0–52.0)
HEMATOCRIT: 35 % — AB (ref 39.0–52.0)
HEMATOCRIT: 28 % — AB (ref 39.0–52.0)
HEMOGLOBIN: 5.8 g/dL — AB (ref 13.0–17.0)
HEMOGLOBIN: 8.5 g/dL — AB (ref 13.0–17.0)
Hemoglobin: 10.2 g/dL — ABNORMAL LOW (ref 13.0–17.0)
Hemoglobin: 10.5 g/dL — ABNORMAL LOW (ref 13.0–17.0)
Hemoglobin: 11.6 g/dL — ABNORMAL LOW (ref 13.0–17.0)
Hemoglobin: 11.9 g/dL — ABNORMAL LOW (ref 13.0–17.0)
Hemoglobin: 13.3 g/dL (ref 13.0–17.0)
Hemoglobin: 8.2 g/dL — ABNORMAL LOW (ref 13.0–17.0)
Hemoglobin: 8.8 g/dL — ABNORMAL LOW (ref 13.0–17.0)
Hemoglobin: 9.5 g/dL — ABNORMAL LOW (ref 13.0–17.0)
Hemoglobin: 9.5 g/dL — ABNORMAL LOW (ref 13.0–17.0)
Hemoglobin: 9.9 g/dL — ABNORMAL LOW (ref 13.0–17.0)
Hemoglobin: 9.9 g/dL — ABNORMAL LOW (ref 13.0–17.0)
O2 SAT: 100 %
O2 SAT: 100 %
O2 Saturation: 100 %
O2 Saturation: 100 %
O2 Saturation: 100 %
O2 Saturation: 100 %
O2 Saturation: 100 %
O2 Saturation: 100 %
O2 Saturation: 100 %
O2 Saturation: 100 %
O2 Saturation: 95 %
O2 Saturation: 99 %
O2 Saturation: 99 %
PH ART: 7.339 — AB (ref 7.350–7.450)
PO2 ART: 187 mmHg — AB (ref 83.0–108.0)
POTASSIUM: 5.1 mmol/L (ref 3.5–5.1)
Patient temperature: 35.5
Patient temperature: 35.6
Patient temperature: 37.3
Patient temperature: 38.3
Potassium: 3.8 mmol/L (ref 3.5–5.1)
Potassium: 3.9 mmol/L (ref 3.5–5.1)
Potassium: 4.2 mmol/L (ref 3.5–5.1)
Potassium: 4.3 mmol/L (ref 3.5–5.1)
Potassium: 4.4 mmol/L (ref 3.5–5.1)
Potassium: 4.4 mmol/L (ref 3.5–5.1)
Potassium: 4.5 mmol/L (ref 3.5–5.1)
Potassium: 5 mmol/L (ref 3.5–5.1)
Potassium: 5 mmol/L (ref 3.5–5.1)
Potassium: 5.1 mmol/L (ref 3.5–5.1)
Potassium: 5.4 mmol/L — ABNORMAL HIGH (ref 3.5–5.1)
Potassium: 5.8 mmol/L — ABNORMAL HIGH (ref 3.5–5.1)
SODIUM: 138 mmol/L (ref 135–145)
Sodium: 135 mmol/L (ref 135–145)
Sodium: 137 mmol/L (ref 135–145)
Sodium: 139 mmol/L (ref 135–145)
Sodium: 139 mmol/L (ref 135–145)
Sodium: 139 mmol/L (ref 135–145)
Sodium: 141 mmol/L (ref 135–145)
Sodium: 142 mmol/L (ref 135–145)
Sodium: 142 mmol/L (ref 135–145)
Sodium: 144 mmol/L (ref 135–145)
Sodium: 144 mmol/L (ref 135–145)
Sodium: 144 mmol/L (ref 135–145)
Sodium: 145 mmol/L (ref 135–145)
TCO2: 23 mmol/L (ref 22–32)
TCO2: 23 mmol/L (ref 22–32)
TCO2: 24 mmol/L (ref 22–32)
TCO2: 25 mmol/L (ref 22–32)
TCO2: 26 mmol/L (ref 22–32)
TCO2: 26 mmol/L (ref 22–32)
TCO2: 26 mmol/L (ref 22–32)
TCO2: 26 mmol/L (ref 22–32)
TCO2: 27 mmol/L (ref 22–32)
TCO2: 27 mmol/L (ref 22–32)
TCO2: 28 mmol/L (ref 22–32)
TCO2: 28 mmol/L (ref 22–32)
TCO2: 30 mmol/L (ref 22–32)
pCO2 arterial: 36.9 mmHg (ref 32.0–48.0)
pCO2 arterial: 37.5 mmHg (ref 32.0–48.0)
pCO2 arterial: 42.7 mmHg (ref 32.0–48.0)
pCO2 arterial: 45.6 mmHg (ref 32.0–48.0)
pCO2 arterial: 45.9 mmHg (ref 32.0–48.0)
pCO2 arterial: 46.8 mmHg (ref 32.0–48.0)
pCO2 arterial: 48.7 mmHg — ABNORMAL HIGH (ref 32.0–48.0)
pCO2 arterial: 49.1 mmHg — ABNORMAL HIGH (ref 32.0–48.0)
pCO2 arterial: 50.2 mmHg — ABNORMAL HIGH (ref 32.0–48.0)
pCO2 arterial: 54.1 mmHg — ABNORMAL HIGH (ref 32.0–48.0)
pCO2 arterial: 54.5 mmHg — ABNORMAL HIGH (ref 32.0–48.0)
pCO2 arterial: 58.8 mmHg — ABNORMAL HIGH (ref 32.0–48.0)
pCO2 arterial: 59.9 mmHg — ABNORMAL HIGH (ref 32.0–48.0)
pH, Arterial: 7.205 — ABNORMAL LOW (ref 7.350–7.450)
pH, Arterial: 7.228 — ABNORMAL LOW (ref 7.350–7.450)
pH, Arterial: 7.26 — ABNORMAL LOW (ref 7.350–7.450)
pH, Arterial: 7.285 — ABNORMAL LOW (ref 7.350–7.450)
pH, Arterial: 7.29 — ABNORMAL LOW (ref 7.350–7.450)
pH, Arterial: 7.306 — ABNORMAL LOW (ref 7.350–7.450)
pH, Arterial: 7.325 — ABNORMAL LOW (ref 7.350–7.450)
pH, Arterial: 7.327 — ABNORMAL LOW (ref 7.350–7.450)
pH, Arterial: 7.336 — ABNORMAL LOW (ref 7.350–7.450)
pH, Arterial: 7.374 (ref 7.350–7.450)
pH, Arterial: 7.392 (ref 7.350–7.450)
pH, Arterial: 7.438 (ref 7.350–7.450)
pO2, Arterial: 129 mmHg — ABNORMAL HIGH (ref 83.0–108.0)
pO2, Arterial: 233 mmHg — ABNORMAL HIGH (ref 83.0–108.0)
pO2, Arterial: 253 mmHg — ABNORMAL HIGH (ref 83.0–108.0)
pO2, Arterial: 262 mmHg — ABNORMAL HIGH (ref 83.0–108.0)
pO2, Arterial: 262 mmHg — ABNORMAL HIGH (ref 83.0–108.0)
pO2, Arterial: 270 mmHg — ABNORMAL HIGH (ref 83.0–108.0)
pO2, Arterial: 309 mmHg — ABNORMAL HIGH (ref 83.0–108.0)
pO2, Arterial: 310 mmHg — ABNORMAL HIGH (ref 83.0–108.0)
pO2, Arterial: 316 mmHg — ABNORMAL HIGH (ref 83.0–108.0)
pO2, Arterial: 343 mmHg — ABNORMAL HIGH (ref 83.0–108.0)
pO2, Arterial: 370 mmHg — ABNORMAL HIGH (ref 83.0–108.0)
pO2, Arterial: 79 mmHg — ABNORMAL LOW (ref 83.0–108.0)

## 2019-02-24 LAB — PROTIME-INR
INR: 1.1 (ref 0.8–1.2)
INR: 1.1 (ref 0.8–1.2)
Prothrombin Time: 13.7 seconds (ref 11.4–15.2)
Prothrombin Time: 14.3 seconds (ref 11.4–15.2)

## 2019-02-24 LAB — COOXEMETRY PANEL
Carboxyhemoglobin: 1.2 % (ref 0.5–1.5)
Carboxyhemoglobin: 1.4 % (ref 0.5–1.5)
Methemoglobin: 1.2 % (ref 0.0–1.5)
Methemoglobin: 1 % (ref 0.0–1.5)
O2 Saturation: 49.5 %
O2 Saturation: 59.9 %
Total hemoglobin: 11.9 g/dL — ABNORMAL LOW (ref 12.0–16.0)
Total hemoglobin: 10.8 g/dL — ABNORMAL LOW (ref 12.0–16.0)

## 2019-02-24 LAB — CBC
HCT: 35 % — ABNORMAL LOW (ref 39.0–52.0)
HCT: 35.2 % — ABNORMAL LOW (ref 39.0–52.0)
HCT: 33.9 % — ABNORMAL LOW (ref 39.0–52.0)
Hemoglobin: 12.1 g/dL — ABNORMAL LOW (ref 13.0–17.0)
Hemoglobin: 12.2 g/dL — ABNORMAL LOW (ref 13.0–17.0)
Hemoglobin: 12 g/dL — ABNORMAL LOW (ref 13.0–17.0)
MCH: 29.7 pg (ref 26.0–34.0)
MCH: 29.8 pg (ref 26.0–34.0)
MCH: 30 pg (ref 26.0–34.0)
MCHC: 34.6 g/dL (ref 30.0–36.0)
MCHC: 34.7 g/dL (ref 30.0–36.0)
MCHC: 35.4 g/dL (ref 30.0–36.0)
MCV: 85.9 fL (ref 80.0–100.0)
MCV: 86 fL (ref 80.0–100.0)
MCV: 84.8 fL (ref 80.0–100.0)
Platelets: 128 10*3/uL — ABNORMAL LOW (ref 150–400)
Platelets: 130 10*3/uL — ABNORMAL LOW (ref 150–400)
Platelets: 134 10*3/uL — ABNORMAL LOW (ref 150–400)
RBC: 4.07 MIL/uL — ABNORMAL LOW (ref 4.22–5.81)
RBC: 4.1 MIL/uL — ABNORMAL LOW (ref 4.22–5.81)
RBC: 4 MIL/uL — ABNORMAL LOW (ref 4.22–5.81)
RDW: 13.5 % (ref 11.5–15.5)
RDW: 13.5 % (ref 11.5–15.5)
RDW: 14 % (ref 11.5–15.5)
WBC: 10.9 10*3/uL — AB (ref 4.0–10.5)
WBC: 13.4 10*3/uL — ABNORMAL HIGH (ref 4.0–10.5)
WBC: 12 10*3/uL — ABNORMAL HIGH (ref 4.0–10.5)
nRBC: 0 % (ref 0.0–0.2)
nRBC: 0 % (ref 0.0–0.2)
nRBC: 0 % (ref 0.0–0.2)

## 2019-02-24 LAB — BASIC METABOLIC PANEL
Anion gap: 8 (ref 5–15)
Anion gap: 7 (ref 5–15)
BUN: 14 mg/dL (ref 6–20)
BUN: 18 mg/dL (ref 6–20)
CO2: 24 mmol/L (ref 22–32)
CO2: 24 mmol/L (ref 22–32)
Calcium: 7.1 mg/dL — ABNORMAL LOW (ref 8.9–10.3)
Calcium: 7.4 mg/dL — ABNORMAL LOW (ref 8.9–10.3)
Chloride: 110 mmol/L (ref 98–111)
Chloride: 113 mmol/L — ABNORMAL HIGH (ref 98–111)
Creatinine, Ser: 0.91 mg/dL (ref 0.61–1.24)
Creatinine, Ser: 1.03 mg/dL (ref 0.61–1.24)
GFR calc Af Amer: >60 mL/min (ref >60)
GFR calc Af Amer: >60 mL/min (ref >60)
GFR calc non Af Amer: >60 mL/min (ref >60)
GFR calc non Af Amer: >60 mL/min (ref >60)
Glucose, Bld: 117 mg/dL — ABNORMAL HIGH (ref 70–99)
Glucose, Bld: 109 mg/dL — ABNORMAL HIGH (ref 70–99)
Potassium: 4.1 mmol/L (ref 3.5–5.1)
Potassium: 3.8 mmol/L (ref 3.5–5.1)
Sodium: 142 mmol/L (ref 135–145)
Sodium: 144 mmol/L (ref 135–145)

## 2019-02-24 LAB — POCT I-STAT 4, (NA,K, GLUC, HGB,HCT)
GLUCOSE: 190 mg/dL — AB (ref 70–99)
Glucose, Bld: 129 mg/dL — ABNORMAL HIGH (ref 70–99)
Glucose, Bld: 111 mg/dL — ABNORMAL HIGH (ref 70–99)
Glucose, Bld: 124 mg/dL — ABNORMAL HIGH (ref 70–99)
Glucose, Bld: 122 mg/dL — ABNORMAL HIGH (ref 70–99)
Glucose, Bld: 163 mg/dL — ABNORMAL HIGH (ref 70–99)
Glucose, Bld: 178 mg/dL — ABNORMAL HIGH (ref 70–99)
Glucose, Bld: 156 mg/dL — ABNORMAL HIGH (ref 70–99)
Glucose, Bld: 135 mg/dL — ABNORMAL HIGH (ref 70–99)
Glucose, Bld: 120 mg/dL — ABNORMAL HIGH (ref 70–99)
HCT: 38 % — ABNORMAL LOW (ref 39.0–52.0)
HCT: 28 % — ABNORMAL LOW (ref 39.0–52.0)
HCT: 34 % — ABNORMAL LOW (ref 39.0–52.0)
HCT: 26 % — ABNORMAL LOW (ref 39.0–52.0)
HCT: 23 % — ABNORMAL LOW (ref 39.0–52.0)
HCT: 24 % — ABNORMAL LOW (ref 39.0–52.0)
HCT: 35 % — ABNORMAL LOW (ref 39.0–52.0)
HCT: 27 % — ABNORMAL LOW (ref 39.0–52.0)
HEMATOCRIT: 26 % — AB (ref 39.0–52.0)
HEMATOCRIT: 28 % — AB (ref 39.0–52.0)
HEMOGLOBIN: 8.2 g/dL — AB (ref 13.0–17.0)
HEMOGLOBIN: 9.5 g/dL — AB (ref 13.0–17.0)
Hemoglobin: 12.9 g/dL — ABNORMAL LOW (ref 13.0–17.0)
Hemoglobin: 11.6 g/dL — ABNORMAL LOW (ref 13.0–17.0)
Hemoglobin: 9.5 g/dL — ABNORMAL LOW (ref 13.0–17.0)
Hemoglobin: 8.8 g/dL — ABNORMAL LOW (ref 13.0–17.0)
Hemoglobin: 8.8 g/dL — ABNORMAL LOW (ref 13.0–17.0)
Hemoglobin: 7.8 g/dL — ABNORMAL LOW (ref 13.0–17.0)
Hemoglobin: 11.9 g/dL — ABNORMAL LOW (ref 13.0–17.0)
Hemoglobin: 9.2 g/dL — ABNORMAL LOW (ref 13.0–17.0)
Potassium: 4.3 mmol/L (ref 3.5–5.1)
Potassium: 5.2 mmol/L — ABNORMAL HIGH (ref 3.5–5.1)
Potassium: 5.3 mmol/L — ABNORMAL HIGH (ref 3.5–5.1)
Potassium: 5.9 mmol/L — ABNORMAL HIGH (ref 3.5–5.1)
Potassium: 4.2 mmol/L (ref 3.5–5.1)
Potassium: 4.4 mmol/L (ref 3.5–5.1)
Potassium: 5.1 mmol/L (ref 3.5–5.1)
Potassium: 5.1 mmol/L (ref 3.5–5.1)
Potassium: 5.9 mmol/L — ABNORMAL HIGH (ref 3.5–5.1)
Potassium: 4.5 mmol/L (ref 3.5–5.1)
SODIUM: 137 mmol/L (ref 135–145)
SODIUM: 140 mmol/L (ref 135–145)
Sodium: 139 mmol/L (ref 135–145)
Sodium: 138 mmol/L (ref 135–145)
Sodium: 134 mmol/L — ABNORMAL LOW (ref 135–145)
Sodium: 136 mmol/L (ref 135–145)
Sodium: 139 mmol/L (ref 135–145)
Sodium: 141 mmol/L (ref 135–145)
Sodium: 140 mmol/L (ref 135–145)
Sodium: 144 mmol/L (ref 135–145)

## 2019-02-24 LAB — PREPARE CRYOPRECIPITATE
Unit division: 0
Unit division: 0

## 2019-02-24 LAB — BPAM CRYOPRECIPITATE
Blood Product Expiration Date: 202002270316
Blood Product Expiration Date: 202002270316
ISSUE DATE / TIME: 202002262154
ISSUE DATE / TIME: 202002262154
Unit Type and Rh: 5100
Unit Type and Rh: 5100

## 2019-02-24 LAB — GLUCOSE, CAPILLARY
Glucose-Capillary: 102 mg/dL — ABNORMAL HIGH (ref 70–99)
Glucose-Capillary: 103 mg/dL — ABNORMAL HIGH (ref 70–99)
Glucose-Capillary: 107 mg/dL — ABNORMAL HIGH (ref 70–99)
Glucose-Capillary: 108 mg/dL — ABNORMAL HIGH (ref 70–99)
Glucose-Capillary: 113 mg/dL — ABNORMAL HIGH (ref 70–99)
Glucose-Capillary: 118 mg/dL — ABNORMAL HIGH (ref 70–99)
Glucose-Capillary: 118 mg/dL — ABNORMAL HIGH (ref 70–99)
Glucose-Capillary: 120 mg/dL — ABNORMAL HIGH (ref 70–99)
Glucose-Capillary: 121 mg/dL — ABNORMAL HIGH (ref 70–99)
Glucose-Capillary: 122 mg/dL — ABNORMAL HIGH (ref 70–99)
Glucose-Capillary: 123 mg/dL — ABNORMAL HIGH (ref 70–99)
Glucose-Capillary: 125 mg/dL — ABNORMAL HIGH (ref 70–99)
Glucose-Capillary: 126 mg/dL — ABNORMAL HIGH (ref 70–99)
Glucose-Capillary: 126 mg/dL — ABNORMAL HIGH (ref 70–99)
Glucose-Capillary: 126 mg/dL — ABNORMAL HIGH (ref 70–99)
Glucose-Capillary: 129 mg/dL — ABNORMAL HIGH (ref 70–99)
Glucose-Capillary: 134 mg/dL — ABNORMAL HIGH (ref 70–99)
Glucose-Capillary: 135 mg/dL — ABNORMAL HIGH (ref 70–99)
Glucose-Capillary: 138 mg/dL — ABNORMAL HIGH (ref 70–99)
Glucose-Capillary: 148 mg/dL — ABNORMAL HIGH (ref 70–99)

## 2019-02-24 LAB — SURGICAL PCR SCREEN
MRSA, PCR: NEGATIVE
Staphylococcus aureus: POSITIVE — AB

## 2019-02-24 LAB — MAGNESIUM
Magnesium: 1.7 mg/dL (ref 1.7–2.4)
Magnesium: 2.3 mg/dL (ref 1.7–2.4)

## 2019-02-24 LAB — HEPATIC FUNCTION PANEL
ALT: 41 U/L (ref 0–44)
AST: 105 U/L — ABNORMAL HIGH (ref 15–41)
Albumin: 2.5 g/dL — ABNORMAL LOW (ref 3.5–5.0)
Alkaline Phosphatase: 30 U/L — ABNORMAL LOW (ref 38–126)
Bilirubin, Direct: 0.3 mg/dL — ABNORMAL HIGH (ref 0.0–0.2)
Indirect Bilirubin: 1 mg/dL — ABNORMAL HIGH (ref 0.3–0.9)
Total Bilirubin: 1.3 mg/dL — ABNORMAL HIGH (ref 0.3–1.2)
Total Protein: 3.8 g/dL — ABNORMAL LOW (ref 6.5–8.1)

## 2019-02-24 LAB — MRSA PCR SCREENING: MRSA by PCR: NEGATIVE

## 2019-02-24 LAB — APTT
aPTT: 38 seconds — ABNORMAL HIGH (ref 24–36)
aPTT: 43 seconds — ABNORMAL HIGH (ref 24–36)

## 2019-02-24 LAB — FIBRINOGEN: Fibrinogen: 130 mg/dL — ABNORMAL LOW (ref 210–475)

## 2019-02-24 MED ORDER — ACETAMINOPHEN 160 MG/5ML PO SOLN
1000.0000 mg | Freq: Four times a day (QID) | ORAL | Status: AC
  Administered 2019-02-24 – 2019-02-26 (×8): 1000 mg
  Filled 2019-02-24 (×8): qty 40.6

## 2019-02-24 MED ORDER — VASOPRESSIN 20 UNIT/ML IV SOLN
0.0100 [IU]/min | INTRAVENOUS | Status: DC
  Filled 2019-02-24: qty 2

## 2019-02-24 MED ORDER — FAMOTIDINE IN NACL 20-0.9 MG/50ML-% IV SOLN
20.0000 mg | Freq: Two times a day (BID) | INTRAVENOUS | Status: AC
  Administered 2019-02-24 (×2): 20 mg via INTRAVENOUS
  Filled 2019-02-24 (×2): qty 50

## 2019-02-24 MED ORDER — FUROSEMIDE 10 MG/ML IJ SOLN
5.0000 mg/h | INTRAVENOUS | Status: DC
  Administered 2019-02-24: 8 mg/h via INTRAVENOUS
  Administered 2019-02-25 – 2019-02-26 (×3): 10 mg/h via INTRAVENOUS
  Administered 2019-02-27: 5 mg/h via INTRAVENOUS
  Filled 2019-02-24 (×2): qty 25
  Filled 2019-02-24: qty 21
  Filled 2019-02-24 (×2): qty 20

## 2019-02-24 MED ORDER — VASOPRESSIN 20 UNIT/ML IV SOLN
INTRAVENOUS | Status: AC
  Filled 2019-02-24: qty 1

## 2019-02-24 MED ORDER — SODIUM CHLORIDE 0.9% FLUSH
3.0000 mL | Freq: Two times a day (BID) | INTRAVENOUS | Status: DC
  Administered 2019-02-24 – 2019-03-05 (×10): 3 mL via INTRAVENOUS

## 2019-02-24 MED ORDER — PROTAMINE SULFATE 10 MG/ML IV SOLN
INTRAVENOUS | Status: AC
  Filled 2019-02-24: qty 50

## 2019-02-24 MED ORDER — SODIUM BICARBONATE 8.4 % IV SOLN
INTRAVENOUS | Status: AC
  Filled 2019-02-24: qty 50

## 2019-02-24 MED ORDER — ROCURONIUM BROMIDE 50 MG/5ML IV SOSY
PREFILLED_SYRINGE | INTRAVENOUS | Status: AC
  Filled 2019-02-24: qty 25

## 2019-02-24 MED ORDER — LEVOFLOXACIN IN D5W 500 MG/100ML IV SOLN
500.0000 mg | INTRAVENOUS | Status: DC
  Filled 2019-02-24: qty 100

## 2019-02-24 MED ORDER — SODIUM CHLORIDE 0.9 % IV SOLN: 250.0000 mL | INTRAVENOUS | Status: DC | PRN

## 2019-02-24 MED ORDER — METOPROLOL TARTRATE 12.5 MG HALF TABLET: 12.5000 mg | ORAL_TABLET | Freq: Two times a day (BID) | ORAL | Status: DC

## 2019-02-24 MED ORDER — CHLORHEXIDINE GLUCONATE 0.12 % MT SOLN
15.0000 mL | OROMUCOSAL | Status: AC
  Administered 2019-02-24: 15 mL via OROMUCOSAL
  Filled 2019-02-24: qty 15

## 2019-02-24 MED ORDER — VASOPRESSIN 20 UNIT/ML IV SOLN
INTRAVENOUS | Status: DC | PRN
  Administered 2019-02-24 (×3): 2 [IU] via INTRAVENOUS

## 2019-02-24 MED ORDER — ACETAMINOPHEN 650 MG RE SUPP
650.0000 mg | Freq: Once | RECTAL | Status: AC
  Administered 2019-02-24: 650 mg via RECTAL

## 2019-02-24 MED ORDER — OXYCODONE HCL 5 MG PO TABS
5.0000 mg | ORAL_TABLET | ORAL | Status: DC | PRN
  Administered 2019-02-26 (×2): 10 mg via ORAL
  Administered 2019-02-27: 5 mg via ORAL
  Administered 2019-02-27 – 2019-03-03 (×6): 10 mg via ORAL
  Administered 2019-03-03: 5 mg via ORAL
  Administered 2019-03-04 – 2019-03-05 (×2): 10 mg via ORAL
  Filled 2019-02-24 (×4): qty 2
  Filled 2019-02-24: qty 1
  Filled 2019-02-24 (×3): qty 2
  Filled 2019-02-24: qty 1
  Filled 2019-02-24 (×2): qty 2
  Filled 2019-02-24: qty 1
  Filled 2019-02-24: qty 2

## 2019-02-24 MED ORDER — INSULIN REGULAR(HUMAN) IN NACL 100-0.9 UT/100ML-% IV SOLN
INTRAVENOUS | Status: DC
  Administered 2019-02-24: 2.8 [IU]/h via INTRAVENOUS

## 2019-02-24 MED ORDER — PHENYLEPHRINE HCL-NACL 20-0.9 MG/250ML-% IV SOLN: 0.0000 ug/min | INTRAVENOUS | Status: DC

## 2019-02-24 MED ORDER — ONDANSETRON HCL 4 MG/2ML IJ SOLN
4.0000 mg | Freq: Four times a day (QID) | INTRAMUSCULAR | Status: DC | PRN
  Administered 2019-02-26 – 2019-02-27 (×2): 4 mg via INTRAVENOUS
  Filled 2019-02-24 (×2): qty 2

## 2019-02-24 MED ORDER — SODIUM CHLORIDE 0.9% FLUSH
3.0000 mL | Freq: Two times a day (BID) | INTRAVENOUS | Status: DC
  Administered 2019-02-25 – 2019-03-05 (×8): 3 mL via INTRAVENOUS

## 2019-02-24 MED ORDER — MILRINONE LACTATE IN DEXTROSE 20-5 MG/100ML-% IV SOLN
0.2500 ug/kg/min | INTRAVENOUS | Status: DC
  Administered 2019-02-25 – 2019-02-27 (×5): 0.25 ug/kg/min via INTRAVENOUS
  Filled 2019-02-24 (×6): qty 100

## 2019-02-24 MED ORDER — LACTATED RINGERS IV SOLN
INTRAVENOUS | Status: DC
  Administered 2019-02-24 – 2019-02-26 (×2): via INTRAVENOUS

## 2019-02-24 MED ORDER — COAGULATION FACTOR VIIA RECOMB 1 MG IV SOLR: 45.0000 ug/kg | Freq: Once | INTRAVENOUS | Status: DC

## 2019-02-24 MED ORDER — FENTANYL 2500MCG IN NS 250ML (10MCG/ML) PREMIX INFUSION
25.0000 ug/h | INTRAVENOUS | Status: DC
  Administered 2019-02-24: 25 ug/h via INTRAVENOUS
  Filled 2019-02-24: qty 250

## 2019-02-24 MED ORDER — SODIUM CHLORIDE 0.9 % IV SOLN
INTRAVENOUS | Status: DC
  Administered 2019-03-02: 06:00:00 via INTRAVENOUS

## 2019-02-24 MED ORDER — LEVOFLOXACIN IN D5W 750 MG/150ML IV SOLN
750.0000 mg | INTRAVENOUS | Status: AC
  Administered 2019-02-24: 750 mg via INTRAVENOUS
  Filled 2019-02-24 (×2): qty 150

## 2019-02-24 MED ORDER — MILRINONE LACTATE IN DEXTROSE 20-5 MG/100ML-% IV SOLN: 0.3000 ug/kg/min | INTRAVENOUS | Status: DC

## 2019-02-24 MED ORDER — ORAL CARE MOUTH RINSE
15.0000 mL | OROMUCOSAL | Status: DC
  Administered 2019-02-24 – 2019-02-26 (×20): 15 mL via OROMUCOSAL

## 2019-02-24 MED ORDER — FENTANYL CITRATE (PF) 100 MCG/2ML IJ SOLN
50.0000 ug | Freq: Once | INTRAMUSCULAR | Status: AC
  Administered 2019-02-24: 50 ug via INTRAVENOUS
  Filled 2019-02-24: qty 2

## 2019-02-24 MED ORDER — POTASSIUM CHLORIDE 10 MEQ/50ML IV SOLN: 10.0000 meq | INTRAVENOUS | Status: DC

## 2019-02-24 MED ORDER — NOREPINEPHRINE 4 MG/250ML-% IV SOLN
0.0000 ug/min | INTRAVENOUS | Status: DC
  Administered 2019-02-24: 4 ug/min via INTRAVENOUS
  Filled 2019-02-24: qty 250

## 2019-02-24 MED ORDER — MIDAZOLAM HCL 2 MG/2ML IJ SOLN: 2.0000 mg | INTRAMUSCULAR | Status: DC | PRN

## 2019-02-24 MED ORDER — SODIUM CHLORIDE 0.9% FLUSH: 3.0000 mL | INTRAVENOUS | Status: DC | PRN

## 2019-02-24 MED ORDER — MILRINONE LACTATE IN DEXTROSE 20-5 MG/100ML-% IV SOLN
0.2500 ug/kg/min | INTRAVENOUS | Status: DC
  Administered 2019-02-24 – 2019-02-25 (×2): 0.25 ug/kg/min via INTRAVENOUS

## 2019-02-24 MED ORDER — CALCIUM CHLORIDE 10 % IV SOLN
INTRAVENOUS | Status: AC
  Filled 2019-02-24: qty 10

## 2019-02-24 MED ORDER — DEXMEDETOMIDINE HCL IN NACL 200 MCG/50ML IV SOLN
0.0000 ug/kg/h | INTRAVENOUS | Status: DC
  Administered 2019-02-24 (×3): 0.7 ug/kg/h via INTRAVENOUS
  Filled 2019-02-24 (×3): qty 50

## 2019-02-24 MED ORDER — LACTATED RINGERS IV SOLN: 500.0000 mL | Freq: Once | INTRAVENOUS | Status: DC | PRN

## 2019-02-24 MED ORDER — COAGULATION FACTOR VIIA RECOMB 1 MG IV SOLR
45.0000 ug/kg | Freq: Once | INTRAVENOUS | Status: AC
  Administered 2019-02-24: 5000 ug via INTRAVENOUS
  Filled 2019-02-24: qty 5

## 2019-02-24 MED ORDER — SODIUM BICARBONATE 8.4 % IV SOLN
INTRAVENOUS | Status: DC | PRN
  Administered 2019-02-24: 50 meq via INTRAVENOUS

## 2019-02-24 MED ORDER — ACETAMINOPHEN 160 MG/5ML PO SOLN: 650.0000 mg | Freq: Once | ORAL | Status: AC

## 2019-02-24 MED ORDER — LEVALBUTEROL HCL 0.63 MG/3ML IN NEBU
0.6300 mg | INHALATION_SOLUTION | Freq: Four times a day (QID) | RESPIRATORY_TRACT | Status: DC
  Administered 2019-02-24 – 2019-02-25 (×7): 0.63 mg via RESPIRATORY_TRACT
  Filled 2019-02-24 (×7): qty 3

## 2019-02-24 MED ORDER — BISACODYL 10 MG RE SUPP
10.0000 mg | Freq: Every day | RECTAL | Status: DC
  Administered 2019-02-24 – 2019-02-25 (×2): 10 mg via RECTAL
  Filled 2019-02-24 (×3): qty 1

## 2019-02-24 MED ORDER — METOPROLOL TARTRATE 5 MG/5ML IV SOLN: 2.5000 mg | INTRAVENOUS | Status: DC | PRN

## 2019-02-24 MED ORDER — CALCIUM CHLORIDE 10 % IV SOLN
INTRAVENOUS | Status: DC | PRN
  Administered 2019-02-24: 1 g via INTRAVENOUS

## 2019-02-24 MED ORDER — PANTOPRAZOLE SODIUM 40 MG PO TBEC
40.0000 mg | DELAYED_RELEASE_TABLET | Freq: Every day | ORAL | Status: DC
  Administered 2019-02-25 – 2019-03-05 (×9): 40 mg via ORAL
  Filled 2019-02-24 (×9): qty 1

## 2019-02-24 MED ORDER — PROPOFOL 10 MG/ML IV BOLUS
INTRAVENOUS | Status: AC
  Filled 2019-02-24: qty 20

## 2019-02-24 MED ORDER — MORPHINE SULFATE (PF) 2 MG/ML IV SOLN
1.0000 mg | INTRAVENOUS | Status: DC | PRN
  Administered 2019-02-24: 4 mg via INTRAVENOUS
  Administered 2019-02-24 – 2019-02-27 (×7): 2 mg via INTRAVENOUS
  Filled 2019-02-24 (×7): qty 1

## 2019-02-24 MED ORDER — TRAMADOL HCL 50 MG PO TABS
50.0000 mg | ORAL_TABLET | ORAL | Status: DC | PRN
  Administered 2019-02-26 – 2019-02-27 (×2): 100 mg via ORAL
  Filled 2019-02-24 (×2): qty 2

## 2019-02-24 MED ORDER — SODIUM CHLORIDE 0.45 % IV SOLN: INTRAVENOUS | Status: DC | PRN

## 2019-02-24 MED ORDER — DOCUSATE SODIUM 100 MG PO CAPS
200.0000 mg | ORAL_CAPSULE | Freq: Every day | ORAL | Status: DC
  Administered 2019-02-27 – 2019-02-28 (×2): 200 mg via ORAL
  Filled 2019-02-24 (×6): qty 2

## 2019-02-24 MED ORDER — CHLORHEXIDINE GLUCONATE CLOTH 2 % EX PADS
6.0000 | MEDICATED_PAD | Freq: Every day | CUTANEOUS | Status: DC
  Administered 2019-02-25: 6 via TOPICAL

## 2019-02-24 MED ORDER — PHENYLEPHRINE 40 MCG/ML (10ML) SYRINGE FOR IV PUSH (FOR BLOOD PRESSURE SUPPORT)
PREFILLED_SYRINGE | INTRAVENOUS | Status: AC
  Filled 2019-02-24: qty 10

## 2019-02-24 MED ORDER — ACETAMINOPHEN 500 MG PO TABS
1000.0000 mg | ORAL_TABLET | Freq: Four times a day (QID) | ORAL | Status: AC
  Administered 2019-02-26 – 2019-03-01 (×11): 1000 mg via ORAL
  Filled 2019-02-24 (×11): qty 2

## 2019-02-24 MED ORDER — SODIUM CHLORIDE 0.9 % IV SOLN: 250.0000 mL | INTRAVENOUS | Status: DC

## 2019-02-24 MED ORDER — FENTANYL CITRATE (PF) 250 MCG/5ML IJ SOLN
INTRAMUSCULAR | Status: AC
  Filled 2019-02-24: qty 5

## 2019-02-24 MED ORDER — SODIUM CHLORIDE 0.9% IV SOLUTION: Freq: Once | INTRAVENOUS | Status: DC

## 2019-02-24 MED ORDER — FENTANYL BOLUS VIA INFUSION
25.0000 ug | INTRAVENOUS | Status: DC | PRN
  Administered 2019-02-25 (×2): 25 ug via INTRAVENOUS
  Filled 2019-02-24: qty 25

## 2019-02-24 MED ORDER — PROTAMINE SULFATE 10 MG/ML IV SOLN
INTRAVENOUS | Status: AC
  Filled 2019-02-24: qty 15

## 2019-02-24 MED ORDER — LACTATED RINGERS IV SOLN: INTRAVENOUS | Status: DC

## 2019-02-24 MED ORDER — NITROGLYCERIN IN D5W 200-5 MCG/ML-% IV SOLN: 0.0000 ug/min | INTRAVENOUS | Status: DC

## 2019-02-24 MED ORDER — HEPARIN SODIUM (PORCINE) 1000 UNIT/ML IJ SOLN
INTRAMUSCULAR | Status: AC
  Filled 2019-02-24: qty 1

## 2019-02-24 MED ORDER — EPINEPHRINE PF 1 MG/ML IJ SOLN
4.0000 ug/min | INTRAVENOUS | Status: DC
  Administered 2019-02-24: 4 ug/min via INTRAVENOUS
  Filled 2019-02-24: qty 4

## 2019-02-24 MED ORDER — CHLORHEXIDINE GLUCONATE 0.12% ORAL RINSE (MEDLINE KIT)
15.0000 mL | Freq: Two times a day (BID) | OROMUCOSAL | Status: DC
  Administered 2019-02-24 – 2019-02-26 (×5): 15 mL via OROMUCOSAL

## 2019-02-24 MED ORDER — DEXMEDETOMIDINE HCL IN NACL 400 MCG/100ML IV SOLN
0.0000 ug/kg/h | INTRAVENOUS | Status: DC
  Administered 2019-02-24 – 2019-02-26 (×8): 0.7 ug/kg/h via INTRAVENOUS
  Filled 2019-02-24 (×8): qty 100

## 2019-02-24 MED ORDER — BISACODYL 5 MG PO TBEC
10.0000 mg | DELAYED_RELEASE_TABLET | Freq: Every day | ORAL | Status: DC
  Administered 2019-02-27 – 2019-02-28 (×2): 10 mg via ORAL
  Filled 2019-02-24 (×4): qty 2

## 2019-02-24 MED ORDER — METOPROLOL TARTRATE 25 MG/10 ML ORAL SUSPENSION: 12.5000 mg | Freq: Two times a day (BID) | ORAL | Status: DC

## 2019-02-24 MED ORDER — MAGNESIUM SULFATE 4 GM/100ML IV SOLN
4.0000 g | Freq: Once | INTRAVENOUS | Status: AC
  Administered 2019-02-24: 4 g via INTRAVENOUS
  Filled 2019-02-24: qty 100

## 2019-02-24 MED ORDER — VANCOMYCIN HCL IN DEXTROSE 1-5 GM/200ML-% IV SOLN
1000.0000 mg | Freq: Once | INTRAVENOUS | Status: AC
  Administered 2019-02-24: 1000 mg via INTRAVENOUS
  Filled 2019-02-24: qty 200

## 2019-02-24 MED ORDER — INSULIN REGULAR BOLUS VIA INFUSION
0.0000 [IU] | Freq: Three times a day (TID) | INTRAVENOUS | Status: DC
  Filled 2019-02-24: qty 10

## 2019-02-24 MED ORDER — POTASSIUM CHLORIDE 10 MEQ/50ML IV SOLN
10.0000 meq | INTRAVENOUS | Status: AC | PRN
  Administered 2019-02-24 (×3): 10 meq via INTRAVENOUS

## 2019-02-24 MED ORDER — HEPARIN SODIUM (PORCINE) 1000 UNIT/ML IJ SOLN
INTRAMUSCULAR | Status: AC
  Filled 2019-02-24: qty 3

## 2019-02-24 MED ORDER — SODIUM CHLORIDE 0.9 % WEIGHT BASED INFUSION
1.0000 mL/kg/h | INTRAVENOUS | Status: AC
  Administered 2019-02-24: 1 mL/kg/h via INTRAVENOUS

## 2019-02-24 MED ORDER — ASPIRIN 81 MG PO CHEW
324.0000 mg | CHEWABLE_TABLET | Freq: Every day | ORAL | Status: DC
  Administered 2019-02-24 – 2019-03-05 (×4): 324 mg
  Filled 2019-02-24 (×6): qty 4

## 2019-02-24 MED ORDER — ALBUMIN HUMAN 5 % IV SOLN: 250.0000 mL | INTRAVENOUS | Status: AC | PRN

## 2019-02-24 MED ORDER — ASPIRIN EC 325 MG PO TBEC
325.0000 mg | DELAYED_RELEASE_TABLET | Freq: Every day | ORAL | Status: DC
  Administered 2019-02-26 – 2019-03-04 (×6): 325 mg via ORAL
  Filled 2019-02-24 (×7): qty 1

## 2019-02-24 NOTE — Progress Notes (Signed)
46f sheath removed from right radial artery. TR band applied and adjusted to 12cc air. No s+s of hematoma. SPO2 99% as measured in right hand.  Tr band on at 07:55:00 at 12cc

## 2019-02-24 NOTE — Progress Notes (Signed)
Initial Nutrition Assessment  DOCUMENTATION CODES:   Obesity unspecified  INTERVENTION:    If TF started, rec Vital High Protein at goal rate of 45 ml/hr (1080 ml per day) and Prostat 60 ml BID  Provides 1480 kcals, 154 gm protein, 903 ml free water daily  NUTRITION DIAGNOSIS:   Inadequate oral intake related to inability to eat as evidenced by NPO status  GOAL:   Provide needs based on ASPEN/SCCM guidelines  MONITOR:   Vent status, Labs, Weight trends, Skin, I & O's  REASON FOR ASSESSMENT:   Ventilator  ASSESSMENT:   58 y.o. Male with PMH of significant for COPD, gout, HTN, class II obesity, prior history of stroke and CAD; presented to the emergency department secondary to worsening shortness of breath and intermittent chest discomfort.   Patient is currently intubated on ventilator support Temp (24hrs), Avg:98.7 F (37.1 C), Min:98.1 F (36.7 C), Max:99.3 F (37.4 C)  OGT to LIS  RD unable to obtain nutrition history. Pt sedated; no family at bedside at time of visit. He is s/p CABG & repair of thoracic aortic dissection.  Medications include Colace, Lasix, MVI & Precedex. Labs reviewed. Mg, Phos, K+ WNL. CBG's [id number].  Spoke with Thayer Headings, RN.  NUTRITION - FOCUSED PHYSICAL EXAM:    Most Recent Value  Orbital Region  Unable to assess [mild to moderate edema]  Upper Arm Region  No depletion  Thoracic and Lumbar Region  No depletion  Buccal Region  Unable to assess [ET tube holder in [place]  No depletion  Clavicle Bone Region  No depletion  Clavicle and Acromion Bone Region  No depletion  Scapular Bone Region  No depletion  Dorsal Hand  Unable to assess [mild to moderate edema]  Patellar Region  Unable to assess  Anterior Thigh Region  Unable to assess  Posterior Calf Region  Unable to assess  Edema (RD Assessment)  Moderate [bilateral lower extremities,  compression wraps]     Diet Order:   Diet Order            Diet NPO time  specified  Diet effective now             EDUCATION NEEDS:   Not appropriate for education at this time  Skin:  Skin Assessment: Skin Integrity Issues: Skin Integrity Issues:: Incisions Incisions: chest and R knee  Last BM:  2/25   Intake/Output Summary (Last 24 hours) at 02/24/2019 1230 Last data filed at 02/24/2019 1100 Gross per 24 hour  Intake 15916.03 ml  Output 9590 ml  Net 6326.03 ml   Height:   Ht Readings from Last 1 Encounters:  02/22/19 5' 8"  (1.727 m)   Weight:   Wt Readings from Last 1 Encounters:  02/22/19 110.2 kg  Admit wt          103.9 kg   Ideal Body Weight:  70 kg  BMI:  Body mass index is 36.95 kg/m.  Estimated Nutritional Needs:   Kcal:  0623-7628  Protein:  >/= 140 gm  Fluid:  per MD  Arthur Holms, RD, LDN Pager #: [phone] After-Hours Pager #: [phone]

## 2019-02-24 NOTE — Progress Notes (Signed)
      [address]       [name] [address]             [phone]      S/p repair of type 1 dissection, CABG x 1  Intubated, sedated  BP (!) 107/51   Pulse 94   Temp (!) 100.9 F (38.3 C)   Resp 19   Ht 5' 8"  (1.727 m)   Wt 110.2 kg   SpO2 97%   BMI 36.95 kg/m  29/19 CI= 2.4 Co-ox 60 On norepi at 4, milrinone 0.25   Intake/Output Summary (Last 24 hours) at 02/24/2019 1908 Last data filed at 02/24/2019 1700 Gross per 24 hour  Intake 16733.85 ml  Output 10400 ml  Net 6333.85 ml   CBG well controlled ABG 7.43/37/79/26  Remains critically ill but hemodynamically stable  Remo Lipps C. Roxan Hockey, MD [facility] [phone]

## 2019-02-24 NOTE — Op Note (Signed)
NAME: [name] [name], LOTT MEDICAL RECORD LF:[id number] ACCOUNT [id number] DATE OF BIRTH:Mar 05, 1961 FACILITY: [facility] LOCATION: [facility] PHYSICIAN:Eavan Gonterman VAN TRIGT III, MD  OPERATIVE REPORT  DATE OF PROCEDURE:  02/23/2019  OPERATION:   1.  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 2.  Coronary artery bypass grafting x1 using an endoscopic harvest of right greater saphenous vein to right coronary artery.  SURGEON:  Ivin Poot, MD  ASSISTANT:  Jadene Pierini, PA-C.  ANESTHESIA:  General.  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  POSTOPERATIVE DIAGNOSIS: Acute type A Stanford aortic dissection during PCI of the right coronary artery with pre-cath Brilinta load induced coagulopathy.    OPERATIVE FINDINGS: 1.  Intimal tear originating in the right coronary ostium with extension through the ascending aorta and extending into the arch and descending thoracic aorta. 2.  Small codominant right coronary artery, but graftable. 3.  Good quality saphenous vein conduit. 4.  Severe diffuse coagulopathy requiring multiple blood product transfusions and factor VII dosing to control bleeding  CLINICAL NOTE:  I was asked to see this patient emergently after cardiac catheterization and PCI on the day of surgery.  He had presented with symptoms of exertional shortness of breath with underlying history of COPD and heavy smoking.  Cardiac  catheterization demonstrated a proximal 90% stenosis of a codominant right coronary artery and a drug-eluting stent was placed.  There was mild to moderate disease of the left main and LAD and circumflex  distributions.  The patient was loaded with  Brilinta 180 mg prior to the catheterization and the stent was placed uneventfully.  At the end of the procedure; however, evidence of an aortic root dissection was noted on the last injection and this was confirmed by CTA.  After the CTA, I saw the  patient emergently in the cardiac cath lab area and recommended emergency surgery for repair of the type A dissection along with saphenous vein graft bypass to the RCA.  I discussed the details of the procedure with the patient and family including the  use of general anesthesia and cardiopulmonary bypass, the location of surgical incisions, and the expected postoperative recovery.  I also reviewed with the patient the risks of bleeding since he had received platelet inhibition drugs and a loading dose  just prior to the cath lab procedure, the risk of stroke due to the need for probable hypothermic circulatory arrest to repair of the dissection, the risk of postoperative organ failure, and the risk of postoperative infection and the risk of death.   After reviewing these issues, the patient demonstrated his understanding and agreed to proceed with surgery under what I felt was informed consent.  OPERATIVE FINDINGS: 1.  Short, obese body habitus made difficult exposure of the heart and axillary artery. 2.  Severe pre and postop pump coagulopathy requiring blood product transfusions. 3.  Successful replacement of the ascending aorta from the sinotubular junction to the proximal arch with a 26 mm Hemashield graft and saphenous vein grafting from the reconstructed ascending aorta to the RCA.  PROCEDURE:  The patient was brought directly from the cath lab to the operating room where anesthesia placed invasive monitoring lines, induced anesthesia and placed a transesophageal echo  probe.  This showed the intimal flap and the aortic root.  There  was no AI.  LV was hypertrophied with a fairly well preserved systolic  function.  The patient was quickly prepped and draped as a sterile field.  A proper time-out was performed.  First, a sheath was placed in the right femoral vein for possible use for  percutaneous cannulation for cardiopulmonary bypass.  Next, a right axillary incision was made and the right axillary artery was dissected surrounded by vessel loops and after heparin was administered, an 8 mm Gelweave graft was sewn end-to-side using running 5-0 Prolene for arterial inflow for bypass.  The  graft was clamped and secured to the side.  A sternal incision was made as the saphenous vein was being harvested endoscopically from the right leg.  The pericardium was opened.  There was bloody serosanguineous pericardial fluid.  There was a hematoma in the aortic root, mainly anteriorly around the area of the right coronary ostium.  A pursestring was placed in the right atrium and after full dose  heparin was administered, the patient was cannulated and placed on bypass.  A LV vent was placed via the right superior pulmonary vein and the aorta was dissected carefully off the pulmonary artery to make room for the crossclamp.  Retrograde  cardioplegia catheter in the coronary sinus was placed.  The patient was cooled to 25 degrees.  The aortic crossclamp was carefully placed on the aorta and cardioplegic arrest was achieved using retrograde coronary sinus cardioplegia.  The aorta was transected beneath the clamp, and there was a complete separation of the adventitia from the media.  Aortic root was carefully inspected.  There was no intimal tear in the axilla aorta.  However, visualization of the right coronary ostium,  although difficult because of enlarged heart and lots of epicardial fat in the RV outflow tract, an injury was noted inside the right coronary ostium.  My plan was to isolate the right coronary ostia from the rest of the aortic root and then place a  saphenous vein graft to the right coronary.  A  pledgeted 4-0 Prolene sutures placed to close off the ostium from the inside of the aorta and a similar suture was placed on the right coronary ostia external to the aorta.  Next, the saphenous vein was taken from the leg and sewn end-to-side to the right coronary artery with a running 7-0 Prolene, and there was good flow through the graft.  Cardioplegia was then dosed through the right coronary vein graft to keep the right  ventricle cooled and protected.  Next, attention was placed to the aortic root.  The aortic tear was severe, especially around the area of the right coronary ostium.  BioGlue and Teflon felt was used to reapproximate the end of it to the adventitia.  Next, a 26 mm Hemashield platinum  woven Dacron graft was sewn end-to-end to the aorta just above the coronary ostium.  This was performed in 2 layers using a running 4-0 Prolene followed by interrupted 4-0 pledgeted sutures around the circumference of the anastomosis for hemostasis.  At this point, the patient had been cooled to 24 degrees and the patient was placed in steep Trendelenburg and the innominate artery was clamped.  Blood was drained to the pump and the crossclamp was removed.  Antegrade cerebral perfusion at 500 mL per  minute was continued during circulatory arrest.  The aorta was transected at the proximal arch.  There is still a  double lumen extending distally and this was sealed off with BioGlue placed between the adventitia and the media.    The end of the 26 mm Hemashield graft was then cut to the appropriate length and angulation and then sewn end-to-end to the proximal arch using 2 layers of running 4-0 Prolene, the first running layer followed by interrupted pledgeted 4-0 Prolene sutures  for hemostasis.    An opening was made in the graft for the proximal vein anastomosis which was achieved with running 6-0 Prolene.  A vent was placed also in the graft to scavenge any air in the left side of the heart.  The  clamp on the innominate artery was removed and  blood flow was resumed to the patient's body.  Circulatory arrest time was 50 minutes.  Antegrade cerebral perfusion was continued virtually the entire period of hypothermic circulatory arrest.  The patient was then rewarmed and reperfused.  Temporary pacing wires were applied.  The patient was rewarmed and started on low dose inotropes.  The suture lines were hemostatic.  The lungs were expanded and the ventilator was resumed.  The patient was  weaned off cardiopulmonary bypass and observed.  There was diffuse severe coagulopathy.  The patient received platelets, FFP and cryoprecipitate.  The patient had persistent severe coagulopathy and was given a dose of factor 7.  This improved the  coagulation profile.  The cannulas had been removed.  We were in the process of closing the chest when the amount of bleeding was not satisfactory to close the sternum.  Further exposure using the chest retractor which was placed shows some bleeding in  the aortic root on the external surface of the aortic root and the area of the right coronary ostium of the proximal right coronary.  Attempts at controlling this bleeding with pledgeted sutures were not successful because of the difficulty with exposure  with the patient's enlarged heart with a large amount of fat and soft tissue in the RV outflow tract overlying the point of bleeding.  We had to re-heparinize and then recannulate arterial and venous access for cardiopulmonary bypass pump.  Using on-pump support and better exposure, interrupted 4-0 pledgeted Prolene sutures were placed in the external portion of the aortic root where  the tear was and where bleeding was seen.  These sutures fortunately stopped the bleeding and we therefore went through the process of rewarming and separating from cardiopulmonary bypass again.  We reversed the heparin and the patient received more  blood products.  At this time, hemostasis  was adequate and the chest incision and the axillary artery incision were closed successfully.  The anterior and posterior mediastinal drainage tubes as well as bilateral pleural tubes were placed.  The patient remained stable after the sternum was closed with interrupted steel wire.  The pectoralis fascia and subcutaneous and skin layers were closed in standard fashion.  The axillary artery incision was closed after ligating and dividing the  axillary artery graft.  A chest x-ray was taken in the operating room showing the lines and support apparatus to be in proper position.  The patient was transported back to the intensive care unit in critical but stable condition.  Total cardiopulmonary  bypass time was 220 minutes.  TN/NUANCE  D:02/24/2019 T:02/24/2019 JOB:[id number]

## 2019-02-24 NOTE — Anesthesia Postprocedure Evaluation (Signed)
Anesthesia Post Note  Patient: [name] [name]  Procedure(s) Performed: REPAIR OF TYPE A  - ACUTE ASCENDING THORACIC AORTIC DISSECTION, using Hemashield Platinum Woven Double Velour Vascular Graft (D: 25m, L: 30cm) (N/A Chest) CORONARY ARTERY BYPASS GRAFTING (CABG) x 1; Using Endoscopically Harvested Right Leg Greater Saphenous Vein Graft (SVG); SVG to RCA (N/A Chest) TRANSESOPHAGEAL ECHOCARDIOGRAM (TEE) (N/A )     Patient location during evaluation: SICU Anesthesia Type: General Level of consciousness: sedated and patient remains intubated per anesthesia plan Pain management: pain level controlled Vital Signs Assessment: post-procedure vital signs reviewed and stable Respiratory status: patient remains intubated per anesthesia plan Cardiovascular status: stable (Milrinone infusion) Postop Assessment: no apparent nausea or vomiting Anesthetic complications: no Comments: To ICU intubated, sedated. Stable on milrinone.    Last Vitals:  Vitals:   02/24/19 0505 02/24/19 0515  BP:    Pulse:    Resp: 17 20  Temp:    SpO2: 97% 99%    Last Pain:  Vitals:   02/24/19 0435  TempSrc: Core (Comment)  PainSc:                  [name] [name]

## 2019-02-24 NOTE — Transfer of Care (Signed)
Immediate Anesthesia Transfer of Care Note  Patient: [name] [name]  Procedure(s) Performed: REPAIR OF TYPE A  - ACUTE ASCENDING THORACIC AORTIC DISSECTION, using Hemashield Platinum Woven Double Velour Vascular Graft (D: 80m, L: 30cm) (N/A Chest) CORONARY ARTERY BYPASS GRAFTING (CABG) x 1; Using Endoscopically Harvested Right Leg Greater Saphenous Vein Graft (SVG); SVG to RCA (N/A Chest) TRANSESOPHAGEAL ECHOCARDIOGRAM (TEE) (N/A )  Patient Location: SICU  Anesthesia Type:General  Level of Consciousness: Patient remains intubated per anesthesia plan  Airway & Oxygen Therapy: Patient remains intubated per anesthesia plan and Patient placed on Ventilator (see vital sign flow sheet for setting)  Post-op Assessment: Report given to RN and Post -op Vital signs reviewed and stable  Post vital signs: Reviewed and stable  Last Vitals:  Vitals Value Taken Time  BP    Temp    Pulse    Resp    SpO2      Last Pain:  Vitals:   02/23/19 1425  TempSrc:   PainSc: 6          Complications: No apparent anesthesia complications

## 2019-02-24 NOTE — Progress Notes (Addendum)
[facility] DAILY ICU PROGRESS NOTE                   [address]            [place], [address]          [phone]   1 Day Post-Op Procedure(s) (LRB): REPAIR OF TYPE A  - ACUTE ASCENDING THORACIC AORTIC DISSECTION, using Hemashield Platinum Woven Double Velour Vascular Graft (D: 10m, L: 30cm) (N/A) CORONARY ARTERY BYPASS GRAFTING (CABG) x 1; Using Endoscopically Harvested Right Leg Greater Saphenous Vein Graft (SVG); SVG to RCA (N/A) TRANSESOPHAGEAL ECHOCARDIOGRAM (TEE) (N/A)  Total Length of Stay:  LOS: 1 day   Subjective: Intubated. Moving all extremities.   Objective: Vital signs in last 24 hours: Temp:  [98.1 F (36.7 C)-98.6 F (37 C)] 98.6 F (37 C) (02/27 0900) Pulse Rate:  [0-97] 92 (02/27 0900) Cardiac Rhythm: Normal sinus rhythm;Bundle branch block (02/27 0800) Resp:  [0-28] 17 (02/27 0900) BP: (93-174)/(52-118) 93/61 (02/27 0900) SpO2:  [0 %-100 %] 100 % (02/27 0900) Arterial Line BP: (94-123)/(53-77) 96/53 (02/27 0900) FiO2 (%):  [80 %-100 %] 80 % (02/27 0808)  Filed Weights   02/22/19 1057 02/22/19 1655  Weight: 103.9 kg 110.2 kg    Weight change:    Hemodynamic parameters for last 24 hours: PAP: (29-38)/(19-28) 29/19 CO:  [3.3 L/min-4 L/min] 3.8 L/min CI:  [1.5 L/min/m2-1.8 L/min/m2] 1.7 L/min/m2  Intake/Output from previous day: 02/26 0701 - 02/27 0700 In: 15550.3 [I.V.:7095.4; BYBOFB:5102 IV Piggyback:514] Out: 95852[Urine:2355; Emesis/NG output:150; Blood:6300; Chest Tube:270]  Intake/Output this shift: Total I/O In: 154.4 [I.V.:114.7; IV Piggyback:39.7] Out: 215 [Urine:125; Chest Tube:90]  Current Meds: Scheduled Meds: . sodium chloride   Intravenous Once  . acetaminophen  1,000 mg Oral Q6H   Or  . acetaminophen (TYLENOL) oral liquid 160 mg/5 mL  1,000 mg Per Tube Q6H  . allopurinol  100 mg Oral q morning - 10a  . aspirin EC  325 mg Oral Daily   Or  . aspirin  324 mg Per Tube Daily  . atorvastatin  40 mg Oral q1800  .  bisacodyl  10 mg Oral Daily   Or  . bisacodyl  10 mg Rectal Daily  . budesonide (PULMICORT) nebulizer solution  0.5 mg Nebulization BID  . chlorhexidine gluconate (MEDLINE KIT)  15 mL Mouth Rinse BID  . Chlorhexidine Gluconate Cloth  6 each Topical Daily  . docusate sodium  200 mg Oral Daily  . heparin  5,000 Units Subcutaneous Q8H  . insulin regular  0-10 Units Intravenous TID WC  . levalbuterol  0.63 mg Nebulization Q6H  . losartan  100 mg Oral Daily  . mouth rinse  15 mL Mouth Rinse 10 times per day  . metoprolol tartrate  12.5 mg Oral BID   Or  . metoprolol tartrate  12.5 mg Per Tube BID  . multivitamin with minerals  1 tablet Oral Daily  . omega-3 acid ethyl esters  1,000 mg Oral Daily  . [START ON 02/25/2019] pantoprazole  40 mg Oral Daily  . predniSONE  40 mg Oral Q breakfast  . sodium chloride flush  3 mL Intravenous Q12H  . sodium chloride flush  3 mL Intravenous Q12H   Continuous Infusions: . sodium chloride    . sodium chloride    . sodium chloride    . sodium chloride 20 mL/hr at 02/24/19 0500  . sodium chloride 1 mL/kg/hr (02/24/19 0445)  . albumin human    .  dexmedetomidine (PRECEDEX) IV infusion 0.7 mcg/kg/hr (02/24/19 0900)  . famotidine (PEPCID) IV    . insulin 2.9 mL/hr at 02/24/19 0900  . lactated ringers    . lactated ringers 20 mL/hr at 02/24/19 0900  . lactated ringers Stopped (02/24/19 0622)  . levofloxacin (LEVAQUIN) IV    . magnesium sulfate 20 mL/hr at 02/24/19 0900  . milrinone 0.125 mcg/kg/min (02/24/19 0900)  . nitroGLYCERIN Stopped (02/24/19 0432)  . norepinephrine (LEVOPHED) Adult infusion 4 mcg/min (02/24/19 0900)  . phenylephrine (NEO-SYNEPHRINE) Adult infusion Stopped (02/24/19 0432)  . vasopressin (PITRESSIN) infusion - *FOR SHOCK*     PRN Meds:.sodium chloride, sodium chloride, acetaminophen, albumin human, ipratropium-albuterol, lactated ringers, metoprolol tartrate, midazolam, morphine injection, nitroGLYCERIN, ondansetron (ZOFRAN) IV,  oxyCODONE, sodium chloride flush, sodium chloride flush, traMADol, traZODone  General appearance: alert, cooperative and no distress Heart: regular rate and rhythm, S1, S2 normal, no murmur, click, rub or gallop Lungs: clear to auscultation bilaterally Abdomen: minimal bowel sounds Extremities: 2-3+ pitting edema in both upper and lower extremities Wound: clean and dry dressed with sterile dressing  Lab Results: CBC: Recent Labs    02/24/19 0438 02/24/19 0452 02/24/19 0500  WBC 13.4*  --  10.9*  HGB 12.2* 9.9* 12.1*  HCT 35.2* 29.0* 35.0*  PLT 130*  --  128*   BMET:  Recent Labs    02/23/19 1314  02/24/19 0313  02/24/19 0452 02/24/19 0500  NA 131*   < > 144   < > 144 142  K 3.9   < > 4.5   < > 4.3 4.1  CL 101  --   --   --   --  110  CO2 20*  --   --   --   --  24  GLUCOSE 129*   < > 120*  --   --  117*  BUN 10  --   --   --   --  14  CREATININE 0.74  --   --   --   --  0.91  CALCIUM 9.0  --   --   --   --  7.1*   < > = values in this interval not displayed.    CMET: Lab Results  Component Value Date   WBC 10.9 (H) 02/24/2019   HGB 12.1 (L) 02/24/2019   HCT 35.0 (L) 02/24/2019   PLT 128 (L) 02/24/2019   GLUCOSE 117 (H) 02/24/2019   CHOL 156 02/23/2019   TRIG 101 02/23/2019   HDL 31 (L) 02/23/2019   LDLCALC 105 (H) 02/23/2019   ALT 39 02/23/2019   AST 28 02/23/2019   NA 142 02/24/2019   K 4.1 02/24/2019   CL 110 02/24/2019   CREATININE 0.91 02/24/2019   BUN 14 02/24/2019   CO2 24 02/24/2019   INR 1.1 02/24/2019   HGBA1C 6.0 (H) 02/23/2019      PT/INR:  Recent Labs    02/24/19 0506  LABPROT 14.3  INR 1.1   Radiology: Dg Chest Port 1 View  Result Date: 02/24/2019 CLINICAL DATA:  57 year old male status post ascending aortic aneurysm repair. EXAM: PORTABLE CHEST 1 VIEW COMPARISON:  Earlier radiograph dated 02/24/2019 FINDINGS: Support lines and tubes in similar position. There is shallow inspiration with bibasilar atelectasis. Infiltrate is not  excluded. Clinical correlation is recommended. No large pleural effusion. No pneumothorax. Enlarged cardiomediastinal silhouette similar to prior radiograph. Surgical clips over the right shoulder. No acute osseous pathology. IMPRESSION: No significant interval change. Electronically Signed   By:  Anner Crete M.D.   On: 02/24/2019 06:57   Dg Chest Portable 1 View  Result Date: 02/24/2019 CLINICAL DATA:  58 year old male with postop heart surgery. EXAM: PORTABLE CHEST 1 VIEW COMPARISON:  Chest radiograph dated 02/22/2019 FINDINGS: Endotracheal tube approximately 4 cm above the carina. Enteric tube extends into the left hemiabdomen with tip beyond the inferior margin of the image. Swan-Ganz catheter with tip in the region of the left pulmonary artery. Bilateral chest tubes as well as mediastinal drain. Shallow inspiration with bibasilar linear atelectasis. Pneumonia is not excluded. No large pleural effusion. No pneumothorax. Mildly enlarged cardiomediastinal silhouette. No acute osseous pathology. Median sternotomy wires and right subclavian surgical clips. IMPRESSION: 1. Shallow inspiration with bibasilar atelectasis. Pneumonia is not excluded. 2. Postoperative changes of open heart surgery with support devices as described. Electronically Signed   By: Anner Crete M.D.   On: 02/24/2019 04:47   Ct Angio Chest/abd/pel For Dissection W And/or W/wo  Result Date: 02/23/2019 CLINICAL DATA:  Iatrogenic thoracic aortic dissection during cardiac catheterization. EXAM: CT ANGIOGRAPHY CHEST, ABDOMEN AND PELVIS TECHNIQUE: Multidetector CT imaging through the chest, abdomen and pelvis was performed using the standard protocol during bolus administration of intravenous contrast. Multiplanar reconstructed images and MIPs were obtained and reviewed to evaluate the vascular anatomy. CONTRAST:  182m ISOVUE-370 IOPAMIDOL (ISOVUE-370) INJECTION 76% COMPARISON:  None. FINDINGS: CTA CHEST FINDINGS Vascular Findings:  Examination is positive for type a thoracic aortic dissection originating at the aortic root, extending through the aortic arch to the level of the mid/distal aspect of the descending thoracic aorta. The dissection does not appear to extend to the level of the diaphragmatic hiatus. While the dissection does extend to abut the origin of the great vessels of the aortic arch, there is no definitive extension into any of the great vessels and it does not result in a definitive hemodynamically significant stenosis. There is minimal opacification of the false lumen of the dissection at the level of the aortic arch (image 77, series 5), however the remainder of the dissection is without definitive opacification of the false lumen. Review of the precontrast images confirm the presence of an intramural hematoma. There is a minimal amount of fluid seen within the pericardial recess. No definitive contrast extravasation. The thoracic aorta is of normal caliber with measurements as follows. Cardiomegaly. Coronary artery calcifications. A stent is seen within the origin of the right coronary artery, the patency of which is not evaluated on this non cardiac CTA examination. Although this examination was not tailored for the evaluation the pulmonary arteries, there are no discrete filling defects within the central pulmonary arterial tree to suggest central pulmonary embolism. Borderline enlarged caliber of the main pulmonary artery measuring 34 mm in diameter. ------------------------------------------------------------- Thoracic aortic measurements: Sinotubular junction 34 mm as measured in greatest oblique short axis coronal dimension. Proximal ascending aorta 38 mm as measured in greatest oblique short axis axial dimension at the level of the main pulmonary artery. Aortic arch aorta 32 mm as measured in greatest oblique short axis sagittal dimension. Proximal descending thoracic aorta 32 mm as measured in greatest oblique  short axis axial dimension at the level of the main pulmonary artery. Distal descending thoracic aorta 27 mm as measured in greatest oblique short axis axial dimension at the level of the diaphragmatic hiatus. Review of the MIP images confirms the above findings. ------------------------------------------------------------- Non-Vascular Findings: Mediastinum/Lymph Nodes: Scattered mediastinal lymph nodes are not enlarged by size criteria. No bulky mediastinal, hilar axillary lymphadenopathy. Lungs/Pleura: Minimal  dependent subpleural ground-glass atelectasis. No discrete focal airspace opacities. No pleural effusion or pneumothorax. No discrete pulmonary nodules. The central pulmonary airways appear widely patent. Musculoskeletal: No acute or aggressive osseous abnormalities within the chest. Stigmata of DISH within the lower thoracic spine. Regional soft tissues appear normal. Normal appearance of the thyroid gland. _________________________________________________________ _________________________________________________________ CTA ABDOMEN AND PELVIS FINDINGS VASCULAR Aorta: The known descending thoracic aorta does not extend to involve the abdominal aorta. There is a moderate to large amount of slightly irregular mixed calcified and noncalcified atherosclerotic plaque throughout the abdominal aorta, not resulting in a hemodynamically significant stenosis. No periaortic stranding. Celiac: There is a minimal amount of eccentric calcified atherosclerotic plaque involving the origin of the celiac artery, not resulting in a hemodynamically significant stenosis. SMA: There is a minimal amount of calcified atherosclerotic plaque involving the origin of the SMA, not resulting in hemodynamically significant stenosis. Conventional branching pattern. The SMA gives rise to the proper hepatic artery which supplies both the right and left hepatic arteries as well as a hypertrophied GDA. Renals: Solitary bilaterally; there is  a minimal amount of eccentric noncalcified atherosclerotic plaque involving the origin of the left renal artery (axial image 169, series 5; coronal image 100, series 9 which results in approximately 50% luminal narrowing and associated mild downstream poststenotic dilatation. The right-sided renal artery is widely patent without hemodynamically significant narrowing. No vessel irregularity to suggest FMD. IMA: Widely patent without hemodynamically significant narrowing. Inflow: There is a moderate amount of slightly irregular mixed calcified and noncalcified atherosclerotic plaque involving the bilateral common iliac arteries which approaches 50% luminal narrowing bilaterally (right - coronal image 98, series 9; left - image 225, series 5). The bilateral internal iliac arteries are disease though patent and of normal caliber. The bilateral external iliac arteries are mildly disease and tortuous but of normal caliber and without a definitive hemodynamically significant narrowing. Veins: The pelvic venous system and IVC appear patent on this arterial phase examination. Review of the MIP images confirms the above findings. _________________________________________________________ NON-VASCULAR Evaluation of the abdominal organs is limited to the arterial phase of enhancement and is further limited secondary to coned field of view. Hepatobiliary: Normal hepatic contour. There is diffuse decreased attenuation of the hepatic parenchyma on this postcontrast examination suggestive of hepatic steatosis. No discrete hyperenhancing hepatic lesions. Post cholecystectomy. No intra or extrahepatic biliary ductal dilatation. No ascites. Pancreas: Normal appearance of the pancreas. Spleen: Normal appearance of the spleen. Adrenals/Urinary Tract: There is symmetric enhancement and excretion of the bilateral kidneys. Contrast is seen within the bilateral collecting systems from recent cardiac catheterization. No evidence urinary  obstruction. No definite renal stones this postcontrast examination. Note is made of an approximately 2.1 cm hypoattenuating left-sided renal cyst. There is an additional punctate subcentimeter left-sided renal cyst (image 161, series 5, which is too small to adequately characterize though favored to represent additional renal cysts. No definite right-sided renal lesions. No urinary obstruction or perinephric stranding. There is mild thickening of the right adrenal gland without discrete nodule. Normal appearance of the right adrenal gland. Excreted contrast is seen within the urinary bladder. Stomach/Bowel: Colonic diverticulosis without evidence of diverticulitis. Bowel is otherwise normal in course and caliber without discrete area of wall thickening. Normal appearance of the terminal ileum and the retrocecal appendix. No definite pneumatosis or free venous gas. Evaluation for pneumoperitoneum is degraded secondary to exclusion of the ventral most aspect of the abdominal wall. Lymphatic: No bulky retroperitoneal, mesenteric, pelvic or inguinal lymphadenopathy. Reproductive: Dystrophic calcifications  within normal sized prostate gland. No free fluid pelvic cul-de-sac. Other: There is a minimal subcutaneous edema about the midline of the low back. Small bilateral mesenteric fat containing inguinal hernias. Focal subcutaneous stranding and emphysema about the right abdominal pannus likely represents sequela of subcutaneous medication administration. Musculoskeletal: No acute or aggressive osseous abnormalities. Post L4-L5 paraspinal fusion intervertebral disc space replacement without evidence of hardware failure or loosening. Review of the MIP images confirms the above findings. IMPRESSION: Chest CTA impression: 1. The examination is positive for type A thoracic aortic dissection originating at the level of the aortic root and extending through the aortic arch to the mid/distal descending thoracic aorta but not to  the level of the diaphragmatic hiatus. There is minimal opacification of the false lumen of the dissection at the level of the aortic root, though the remainder of the false lumen of the dissection is not opacified. The dissection abuts the origin of the great vessels of the aortic arch without extension into or narrowing of the great vessels. No contrast extravasation or definitive periaortic stranding. 2. Post stenting of the origin and proximal aspect the right coronary artery, the patency of which is not evaluated on this non coronary CTA. 3. Cardiomegaly. Enlargement of the caliber the main pulmonary artery, nonspecific though could be seen in the setting pulmonary arterial hypertension. Abdomen and pelvic CTA impression: 1. Moderate to large amount of atherosclerotic plaque throughout the abdominal aorta, not resulting in hemodynamically significant stenosis. There is no extension of the thoracic aortic to involve the abdominal aorta. 2. Suspected hemodynamically significant narrowing involving the origin of left artery without associated asymmetric renal atrophy or delayed enhancement. 3. Suspected hemodynamically significant narrowing involving the bilateral common iliac arteries. Correlation symptoms of PAD is advised. 4. Suspected hepatic steatosis. Correlation with LFTs could performed as indicated. 5. Colonic diverticulosis without evidence of superimposed acute diverticulitis. Above findings were discussed with Dr. Martinique at the time of examination completion. Electronically Signed   By: Sandi Mariscal M.D.   On: 02/23/2019 15:27     Assessment/Plan: S/P Procedure(s) (LRB): REPAIR OF TYPE A  - ACUTE ASCENDING THORACIC AORTIC DISSECTION, using Hemashield Platinum Woven Double Velour Vascular Graft (D: 36m, L: 30cm) (N/A) CORONARY ARTERY BYPASS GRAFTING (CABG) x 1; Using Endoscopically Harvested Right Leg Greater Saphenous Vein Graft (SVG); SVG to RCA (N/A) TRANSESOPHAGEAL ECHOCARDIOGRAM (TEE)  (N/A)  1. CV-NSR in the 90s, MAP 65-70. On milrinone, norepinephrine. Continue a-line.  2. Pulm-remains on ventilator FiO2 80%, Only about 300cc out of chest tubes since surgery.  3. Renal-creatinine 0.91, electrolytes okay. Fluid overloaded-will hold off on diuresis until off Levophed.  4. H and H 12.1/35.0, thrombocytopenia-platelets 128k 5. Endo-blood glucose well controlled on Insulin gtt 6. Continue post-op antibiotics  Plan: Weaning ventilator slowly. Responsive-moving all extremities. Right groin sheath remains. Right radial artery sheath removed and TR band applied this morning. Continue chest tubes. Continue to wean norepinephrine as tolerated. Replacing magnesium.  TElgie Collard2/27/2020 9:15 AM   Leave intubated today  Start lasix drip for diuresis Follow cvp, co-ox and increase milrinone to 0.25 for  RV dysfunction  patient examined and medical record reviewed,agree with above note. PTharon AquasTrigt III 02/24/2019

## 2019-02-25 ENCOUNTER — Inpatient Hospital Stay (HOSPITAL_COMMUNITY): Payer: Medicare Other

## 2019-02-25 ENCOUNTER — Inpatient Hospital Stay: Payer: Self-pay

## 2019-02-25 LAB — COMPREHENSIVE METABOLIC PANEL
ALT: 42 U/L (ref 0–44)
AST: 100 U/L — ABNORMAL HIGH (ref 15–41)
Albumin: 2.4 g/dL — ABNORMAL LOW (ref 3.5–5.0)
Alkaline Phosphatase: 28 U/L — ABNORMAL LOW (ref 38–126)
Anion gap: 9 (ref 5–15)
BUN: 21 mg/dL — ABNORMAL HIGH (ref 6–20)
CO2: 23 mmol/L (ref 22–32)
Calcium: 8.1 mg/dL — ABNORMAL LOW (ref 8.9–10.3)
Chloride: 111 mmol/L (ref 98–111)
Creatinine, Ser: 1.03 mg/dL (ref 0.61–1.24)
GFR calc Af Amer: >60 mL/min (ref >60)
GFR calc non Af Amer: >60 mL/min (ref >60)
Glucose, Bld: 136 mg/dL — ABNORMAL HIGH (ref 70–99)
Potassium: 3.2 mmol/L — ABNORMAL LOW (ref 3.5–5.1)
Sodium: 143 mmol/L (ref 135–145)
Total Bilirubin: 0.6 mg/dL (ref 0.3–1.2)
Total Protein: 4.2 g/dL — ABNORMAL LOW (ref 6.5–8.1)

## 2019-02-25 LAB — POCT I-STAT 7, (LYTES, BLD GAS, ICA,H+H)
Acid-Base Excess: 2 mmol/L (ref 0.0–2.0)
Acid-Base Excess: 1 mmol/L (ref 0.0–2.0)
Bicarbonate: 25.3 mmol/L (ref 20.0–28.0)
Bicarbonate: 24.5 mmol/L (ref 20.0–28.0)
Calcium, Ion: 1.18 mmol/L (ref 1.15–1.40)
Calcium, Ion: 1.19 mmol/L (ref 1.15–1.40)
HCT: 26 % — ABNORMAL LOW (ref 39.0–52.0)
HCT: 25 % — ABNORMAL LOW (ref 39.0–52.0)
Hemoglobin: 8.8 g/dL — ABNORMAL LOW (ref 13.0–17.0)
Hemoglobin: 8.5 g/dL — ABNORMAL LOW (ref 13.0–17.0)
O2 Saturation: 96 %
O2 Saturation: 94 %
Patient temperature: 37.9
Patient temperature: 37.6
Potassium: 3.4 mmol/L — ABNORMAL LOW (ref 3.5–5.1)
Potassium: 3.6 mmol/L (ref 3.5–5.1)
Sodium: 145 mmol/L (ref 135–145)
Sodium: 143 mmol/L (ref 135–145)
TCO2: 26 mmol/L (ref 22–32)
TCO2: 26 mmol/L (ref 22–32)
pCO2 arterial: 32.9 mmHg (ref 32.0–48.0)
pCO2 arterial: 34.1 mmHg (ref 32.0–48.0)
pH, Arterial: 7.497 — ABNORMAL HIGH (ref 7.350–7.450)
pH, Arterial: 7.468 — ABNORMAL HIGH (ref 7.350–7.450)
pO2, Arterial: 74 mmHg — ABNORMAL LOW (ref 83.0–108.0)
pO2, Arterial: 69 mmHg — ABNORMAL LOW (ref 83.0–108.0)

## 2019-02-25 LAB — CBC
HCT: 29.8 % — ABNORMAL LOW (ref 39.0–52.0)
Hemoglobin: 10.5 g/dL — ABNORMAL LOW (ref 13.0–17.0)
MCH: 30.2 pg (ref 26.0–34.0)
MCHC: 35.2 g/dL (ref 30.0–36.0)
MCV: 85.6 fL (ref 80.0–100.0)
Platelets: 92 10*3/uL — ABNORMAL LOW (ref 150–400)
RBC: 3.48 MIL/uL — ABNORMAL LOW (ref 4.22–5.81)
RDW: 14.4 % (ref 11.5–15.5)
WBC: 12.4 10*3/uL — ABNORMAL HIGH (ref 4.0–10.5)
nRBC: 0 % (ref 0.0–0.2)

## 2019-02-25 LAB — BPAM PLATELET PHERESIS
Blood Product Expiration Date: 202002271815
Blood Product Expiration Date: 202002272359
Blood Product Expiration Date: 202002292359
Blood Product Expiration Date: 202002282359
Blood Product Expiration Date: 202002292359
ISSUE DATE / TIME: 202002261820
ISSUE DATE / TIME: 202002261822
ISSUE DATE / TIME: 202002270045
ISSUE DATE / TIME: 202002270232
ISSUE DATE / TIME: 202002270232
Unit Type and Rh: 6200
Unit Type and Rh: 6200
Unit Type and Rh: 6200
Unit Type and Rh: 2800
Unit Type and Rh: 5100

## 2019-02-25 LAB — COOXEMETRY PANEL
Carboxyhemoglobin: 1.5 % (ref 0.5–1.5)
Methemoglobin: 1.1 % (ref 0.0–1.5)
O2 Saturation: 64.3 %
Total hemoglobin: 9.7 g/dL — ABNORMAL LOW (ref 12.0–16.0)

## 2019-02-25 LAB — BASIC METABOLIC PANEL
Anion gap: 7 (ref 5–15)
BUN: 22 mg/dL — ABNORMAL HIGH (ref 6–20)
CO2: 24 mmol/L (ref 22–32)
Calcium: 8.3 mg/dL — ABNORMAL LOW (ref 8.9–10.3)
Chloride: 110 mmol/L (ref 98–111)
Creatinine, Ser: 1.03 mg/dL (ref 0.61–1.24)
GFR calc Af Amer: >60 mL/min (ref >60)
GFR calc non Af Amer: >60 mL/min (ref >60)
Glucose, Bld: 202 mg/dL — ABNORMAL HIGH (ref 70–99)
Potassium: 3.6 mmol/L (ref 3.5–5.1)
Sodium: 141 mmol/L (ref 135–145)

## 2019-02-25 LAB — GLUCOSE, CAPILLARY
Glucose-Capillary: 107 mg/dL — ABNORMAL HIGH (ref 70–99)
Glucose-Capillary: 117 mg/dL — ABNORMAL HIGH (ref 70–99)
Glucose-Capillary: 133 mg/dL — ABNORMAL HIGH (ref 70–99)
Glucose-Capillary: 133 mg/dL — ABNORMAL HIGH (ref 70–99)
Glucose-Capillary: 134 mg/dL — ABNORMAL HIGH (ref 70–99)
Glucose-Capillary: 134 mg/dL — ABNORMAL HIGH (ref 70–99)
Glucose-Capillary: 134 mg/dL — ABNORMAL HIGH (ref 70–99)
Glucose-Capillary: 135 mg/dL — ABNORMAL HIGH (ref 70–99)
Glucose-Capillary: 144 mg/dL — ABNORMAL HIGH (ref 70–99)
Glucose-Capillary: 148 mg/dL — ABNORMAL HIGH (ref 70–99)
Glucose-Capillary: 159 mg/dL — ABNORMAL HIGH (ref 70–99)
Glucose-Capillary: 190 mg/dL — ABNORMAL HIGH (ref 70–99)
Glucose-Capillary: 195 mg/dL — ABNORMAL HIGH (ref 70–99)

## 2019-02-25 LAB — PREPARE PLATELET PHERESIS
Unit division: 0
Unit division: 0
Unit division: 0
Unit division: 0
Unit division: 0

## 2019-02-25 LAB — MAGNESIUM: Magnesium: 2.2 mg/dL (ref 1.7–2.4)

## 2019-02-25 LAB — POCT ACTIVATED CLOTTING TIME: Activated Clotting Time: 637 seconds

## 2019-02-25 MED ORDER — SODIUM CHLORIDE 0.9% FLUSH: 10.0000 mL | INTRAVENOUS | Status: DC | PRN

## 2019-02-25 MED ORDER — VANCOMYCIN HCL 10 G IV SOLR
1500.0000 mg | Freq: Two times a day (BID) | INTRAVENOUS | Status: DC
  Administered 2019-02-25 – 2019-02-26 (×3): 1500 mg via INTRAVENOUS
  Filled 2019-02-25 (×5): qty 1500

## 2019-02-25 MED ORDER — SODIUM CHLORIDE 0.9% FLUSH
10.0000 mL | Freq: Two times a day (BID) | INTRAVENOUS | Status: DC
  Administered 2019-02-25 – 2019-03-03 (×11): 10 mL
  Administered 2019-03-03: 20 mL
  Administered 2019-03-04 – 2019-03-05 (×3): 10 mL

## 2019-02-25 MED ORDER — MUPIROCIN 2 % EX OINT
TOPICAL_OINTMENT | Freq: Two times a day (BID) | CUTANEOUS | Status: DC
  Administered 2019-02-25: 1 via NASAL
  Administered 2019-02-25 – 2019-02-27 (×4): via NASAL
  Administered 2019-02-27: 1 via NASAL
  Administered 2019-02-28 – 2019-03-01 (×4): via NASAL
  Administered 2019-03-02: 1 via NASAL
  Administered 2019-03-02 – 2019-03-05 (×6): via NASAL
  Filled 2019-02-25 (×4): qty 22

## 2019-02-25 MED ORDER — AMIODARONE HCL 200 MG PO TABS
200.0000 mg | ORAL_TABLET | Freq: Two times a day (BID) | ORAL | Status: DC
  Administered 2019-02-25 – 2019-02-26 (×4): 200 mg via ORAL
  Filled 2019-02-25 (×4): qty 1

## 2019-02-25 MED ORDER — POTASSIUM CHLORIDE 10 MEQ/50ML IV SOLN
10.0000 meq | INTRAVENOUS | Status: AC
  Administered 2019-02-25 (×3): 10 meq via INTRAVENOUS
  Filled 2019-02-25 (×3): qty 50

## 2019-02-25 MED ORDER — LEVOFLOXACIN IN D5W 500 MG/100ML IV SOLN: 500.0000 mg | INTRAVENOUS | Status: DC

## 2019-02-25 MED ORDER — INSULIN DETEMIR 100 UNIT/ML ~~LOC~~ SOLN
14.0000 [IU] | Freq: Two times a day (BID) | SUBCUTANEOUS | Status: DC
  Administered 2019-02-25 – 2019-03-01 (×9): 14 [IU] via SUBCUTANEOUS
  Filled 2019-02-25 (×11): qty 0.14

## 2019-02-25 MED ORDER — LEVOFLOXACIN IN D5W 750 MG/150ML IV SOLN
750.0000 mg | Freq: Once | INTRAVENOUS | Status: AC
  Administered 2019-02-25: 750 mg via INTRAVENOUS
  Filled 2019-02-25: qty 150

## 2019-02-25 MED ORDER — POTASSIUM CHLORIDE 10 MEQ/50ML IV SOLN
10.0000 meq | INTRAVENOUS | Status: AC
  Administered 2019-02-25 (×2): 10 meq via INTRAVENOUS
  Filled 2019-02-25 (×2): qty 50

## 2019-02-25 MED ORDER — LEVOFLOXACIN IN D5W 750 MG/150ML IV SOLN: 750.0000 mg | Freq: Once | INTRAVENOUS | Status: DC

## 2019-02-25 MED ORDER — INSULIN DETEMIR 100 UNIT/ML ~~LOC~~ SOLN
14.0000 [IU] | Freq: Two times a day (BID) | SUBCUTANEOUS | Status: DC
  Administered 2019-02-25: 14 [IU] via SUBCUTANEOUS
  Filled 2019-02-25: qty 0.14

## 2019-02-25 MED ORDER — CHLORHEXIDINE GLUCONATE CLOTH 2 % EX PADS
6.0000 | MEDICATED_PAD | Freq: Every day | CUTANEOUS | Status: DC
  Administered 2019-02-25 – 2019-03-05 (×7): 6 via TOPICAL

## 2019-02-25 MED ORDER — METOLAZONE 5 MG PO TABS: 5.0000 mg | ORAL_TABLET | Freq: Every day | ORAL | Status: DC

## 2019-02-25 MED ORDER — METOLAZONE 5 MG PO TABS
10.0000 mg | ORAL_TABLET | Freq: Every day | ORAL | Status: AC
  Administered 2019-02-25 – 2019-02-26 (×2): 10 mg via ORAL
  Filled 2019-02-25 (×2): qty 2

## 2019-02-25 MED ORDER — INSULIN ASPART 100 UNIT/ML ~~LOC~~ SOLN
0.0000 [IU] | SUBCUTANEOUS | Status: DC
  Administered 2019-02-25: 2 [IU] via SUBCUTANEOUS
  Administered 2019-02-25: 4 [IU] via SUBCUTANEOUS
  Administered 2019-02-26 (×4): 2 [IU] via SUBCUTANEOUS
  Administered 2019-02-26: 4 [IU] via SUBCUTANEOUS
  Administered 2019-02-26 – 2019-02-28 (×7): 2 [IU] via SUBCUTANEOUS

## 2019-02-25 MED ORDER — POTASSIUM CHLORIDE 10 MEQ/50ML IV SOLN
10.0000 meq | INTRAVENOUS | Status: AC | PRN
  Administered 2019-02-25 (×3): 10 meq via INTRAVENOUS
  Filled 2019-02-25 (×3): qty 50

## 2019-02-25 NOTE — Progress Notes (Addendum)
2 Days Post-Op Procedure(s) (LRB): REPAIR OF TYPE A  - ACUTE ASCENDING THORACIC AORTIC DISSECTION, using Hemashield Platinum Woven Double Velour Vascular Graft (D: 84m, L: 30cm) (N/A) CORONARY ARTERY BYPASS GRAFTING (CABG) x 1; Using Endoscopically Harvested Right Leg Greater Saphenous Vein Graft (SVG); SVG to RCA (N/A) TRANSESOPHAGEAL ECHOCARDIOGRAM (TEE) (N/A) Subjective: Patient responsive on ventilator and moves all extremities purposefully Hemodynamic stable on 0.25 milrinone and low-dose norepinephrine.  Co-oximetry 64% Lasix drip removed 2.2 L yesterday, weight still up.  BUN/creatinine normal. Chest x-ray shows some interstitial edema, PO2 74 on FiO2 50% Hemoglobin stable, chest tube drainage satisfactory  Objective: Vital signs in last 24 hours: Temp:  [98.8 F (37.1 C)-101.1 F (38.4 C)] 99.9 F (37.7 C) (02/28 0800) Pulse Rate:  [47-99] 69 (02/28 0800) Cardiac Rhythm: Normal sinus rhythm (02/28 0400) Resp:  [0-31] 22 (02/28 0800) BP: (85-140)/(47-105) 125/47 (02/28 0800) SpO2:  [91 %-100 %] 97 % (02/28 0800) Arterial Line BP: (89-168)/(31-67) 127/41 (02/28 0800) FiO2 (%):  [50 %-60 %] 50 % (02/28 0725) Weight:  [120.1 kg] 120.1 kg (02/28 0500)  Hemodynamic parameters for last 24 hours: PAP: (16-62)/(6-30) 22/11 CVP:  [2 mmHg-24 mmHg] 6 mmHg CO:  [4 L/min-6.7 L/min] 6.4 L/min CI:  [1.8 L/min/m2-3 L/min/m2] 2.9 L/min/m2  Intake/Output from previous day: 02/27 0701 - 02/28 0700 In: 2746.8 [I.V.:1985.6; NG/GT:120; IV Piggyback:571.3] Out: 3150 [Urine:2130; Emesis/NG output:350; Chest Tube:670] Intake/Output this shift: Total I/O In: 124.9 [I.V.:75.2; IV Piggyback:49.7] Out: 320 [Urine:250; Chest Tube:70]  Exam Patient opens eyes and responds Scattered rhonchi No cardiac murmur, sinus rhythm Abdomen distended but soft Extremities warm  Lab Results: Recent Labs    02/24/19 1022  02/25/19 0421 02/25/19 0427  WBC 12.0*  --  12.4*  --   HGB 12.0*   < > 10.5*  8.8*  HCT 33.9*   < > 29.8* 26.0*  PLT 134*  --  92*  --    < > = values in this interval not displayed.   BMET:  Recent Labs    02/24/19 1022  02/25/19 0427 02/25/19 0500  NA 144   < > 145 143  K 3.8   < > 3.4* 3.2*  CL 113*  --   --  111  CO2 24  --   --  23  GLUCOSE 109*  --   --  136*  BUN 18  --   --  21*  CREATININE 1.03  --   --  1.03  CALCIUM 7.4*  --   --  8.1*   < > = values in this interval not displayed.    PT/INR:  Recent Labs    02/24/19 0506  LABPROT 14.3  INR 1.1   ABG    Component Value Date/Time   PHART 7.497 (H) 02/25/2019 0427   HCO3 25.3 02/25/2019 0427   TCO2 26 02/25/2019 0427   ACIDBASEDEF 3.0 (H) 02/24/2019 0155   O2SAT 64.3 02/25/2019 0442   CBG (last 3)  Recent Labs    02/25/19 0628 02/25/19 0718 02/25/19 0823  GLUCAP 144* 134* 135*    Assessment/Plan: S/P Procedure(s) (LRB): REPAIR OF TYPE A  - ACUTE ASCENDING THORACIC AORTIC DISSECTION, using Hemashield Platinum Woven Double Velour Vascular Graft (D: 216m L: 30cm) (N/A) CORONARY ARTERY BYPASS GRAFTING (CABG) x 1; Using Endoscopically Harvested Right Leg Greater Saphenous Vein Graft (SVG); SVG to RCA (N/A) TRANSESOPHAGEAL ECHOCARDIOGRAM (TEE) (N/A) We will leave patient intubated 1 more day for diuresis--patient has 3 pack per day smoking history and  would be marginal without ventilator today.  He is tolerating ventilator and sputum cultures are pending for low-grade fever.  Gram-positive cocci, rare.   LOS: 2 days    [name] [name] 02/25/2019

## 2019-02-25 NOTE — Progress Notes (Signed)
Peripherally Inserted Central Catheter/Midline Placement  The IV Nurse has discussed with the patient and/or persons authorized to consent for the patient, the purpose of this procedure and the potential benefits and risks involved with this procedure.  The benefits include less needle sticks, lab draws from the catheter, and the patient may be discharged home with the catheter. Risks include, but not limited to, infection, bleeding, blood clot (thrombus formation), and puncture of an artery; nerve damage and irregular heartbeat and possibility to perform a PICC exchange if needed/ordered by physician.  Alternatives to this procedure were also discussed.  Bard Power PICC patient education guide, fact sheet on infection prevention and patient information card has been provided to patient /or left at bedside.  Consent signed  wife  PICC/Midline Placement Documentation  PICC Double Lumen 84/12/82 PICC Right Basilic 44 cm 2 cm (Active)  Indication for Insertion or Continuance of Line Vasoactive infusions 02/25/2019  6:20 PM  Exposed Catheter (cm) 2 cm 02/25/2019  6:20 PM  Site Assessment Clean;Dry;Intact 02/25/2019  6:20 PM  Lumen #1 Status Flushed;Saline locked;Blood return noted 02/25/2019  6:20 PM  Lumen #2 Status Flushed;Saline locked;Blood return noted 02/25/2019  6:20 PM  Dressing Type Transparent 02/25/2019  6:20 PM  Dressing Status Clean;Dry;Intact;Antimicrobial disc in place 02/25/2019  6:20 PM  Dressing Change Due 03/04/19 02/25/2019  6:20 PM       Gordan Payment 02/25/2019, 6:21 PM

## 2019-02-25 NOTE — Progress Notes (Signed)
Pharmacy Antibiotic Note  [name] [name] is a 58 y.o. male admitted on 02/22/2019 with pneumonia.  Pharmacy has been consulted for vancomycin dosing.  WBC 12.4 (on steroids), afebrile. Scr 1.03 (CrCl 98 mL/min). CXR showing mild bibasilar subsegmental atelectasis. MRSA PCR staph aureus positive but neg MRSA. Tracheal aspirate showing few staph aureus - sensitivities pending.   Plan: Vancomycin 1500 mg IV every 12 hours  Monitor renal rx, cx results, clinical pic, and levels as appropriate >> consider de-escalation once sensitivities return  Height: 5' 8"  (172.7 cm) Weight: 264 lb 12.4 oz (120.1 kg) IBW/kg (Calculated) : 68.4  Temp (24hrs), Avg:100.4 F (38 C), Min:99.5 F (37.5 C), Max:101.1 F (38.4 C)  Recent Labs  Lab 02/23/19 1314 02/24/19 0438 02/24/19 0500 02/24/19 1022 02/25/19 0421 02/25/19 0500 02/25/19 1522  WBC 8.1 13.4* 10.9* 12.0* 12.4*  --   --   CREATININE 0.74  --  0.91 1.03  --  1.03 1.03    Estimated Creatinine Clearance: 98.5 mL/min (by C-G formula based on SCr of 1.03 mg/dL).    Allergies  Allergen Reactions  . Penicillins Hives and Rash    Did it involve swelling of the face/tongue/throat, SOB, or low BP? Yes Did it involve sudden or severe rash/hives, skin peeling, or any reaction on the inside of your mouth or nose? Yes  Did you need to seek medical attention at a hospital or doctor's office? Yes When did it last happen?As a child. If all above answers are "NO", may proceed with cephalosporin use.     Antimicrobials this admission: Vancomycin 2/26>2/27 (surgical ppx), 2/28 >>  Levaquin 2/26 >> 2/28 (surgical ppx)  Dose adjustments this admission: N/A  Microbiology results: 2/27 Sputum: few staph aureus  2/27 MRSA PCR: staph aureus +, MRSA -  Thank you for allowing pharmacy to be a part of this patient's care.  Antonietta Jewel, PharmD, [facility] Clinical Pharmacist  Pager: [phone] Phone: [phone] 02/25/2019 7:34 PM

## 2019-02-25 NOTE — Progress Notes (Signed)
Patient ID: [name] [name], male   DOB: 1961/01/01, 58 y.o.   MRN: [id number] EVENING ROUNDS NOTE :     [address]       [place] [address]             [phone]                 2 Days Post-Op Procedure(s) (LRB): REPAIR OF TYPE A  - ACUTE ASCENDING THORACIC AORTIC DISSECTION, using Hemashield Platinum Woven Double Velour Vascular Graft (D: 56m, L: 30cm) (N/A) CORONARY ARTERY BYPASS GRAFTING (CABG) x 1; Using Endoscopically Harvested Right Leg Greater Saphenous Vein Graft (SVG); SVG to RCA (N/A) TRANSESOPHAGEAL ECHOCARDIOGRAM (TEE) (N/A)  Total Length of Stay:  LOS: 2 days  BP (!) 105/59   Pulse 92   Temp 99.5 F (37.5 C)   Resp 18   Ht 5' 8"  (1.727 m)   Wt 120.1 kg   SpO2 97%   BMI 40.26 kg/m   .Intake/Output      02/27 0701 - 02/28 0700 02/28 0701 - 02/29 0700   I.V. (mL/kg) 1985.6 (16.5) 697.6 (5.8)   Blood     Other 70    NG/GT 120    IV Piggyback 571.3 339.3   Total Intake(mL/kg) 2746.8 (22.9) 1036.9 (8.6)   Urine (mL/kg/hr) 2130 (0.7) 875 (0.7)   Emesis/NG output 350    Blood     Chest Tube 670 170   Total Output 3150 1045   Net -403.2 -8.1          . sodium chloride    . sodium chloride    . sodium chloride    . sodium chloride 20 mL/hr at 02/24/19 0500  . dexmedetomidine (PRECEDEX) IV infusion 0.7 mcg/kg/hr (02/25/19 1705)  . fentaNYL infusion INTRAVENOUS 50 mcg/hr (02/25/19 1700)  . furosemide (LASIX) infusion 10 mg/hr (02/25/19 1700)  . insulin Stopped (02/25/19 1135)  . lactated ringers 20 mL/hr at 02/24/19 1400  . lactated ringers 20 mL/hr at 02/25/19 1700  . [START ON 02/26/2019] levofloxacin (LEVAQUIN) IV    . milrinone 0.25 mcg/kg/min (02/25/19 1700)  . norepinephrine (LEVOPHED) Adult infusion 3 mcg/min (02/25/19 1000)  . potassium chloride 10 mEq (02/25/19 1703)     Lab Results  Component Value Date   WBC 12.4 (H) 02/25/2019   HGB 8.5 (L) 02/25/2019   HCT 25.0 (L) 02/25/2019   PLT 92 (L) 02/25/2019   GLUCOSE 202  (H) 02/25/2019   CHOL 156 02/23/2019   TRIG 101 02/23/2019   HDL 31 (L) 02/23/2019   LDLCALC 105 (H) 02/23/2019   ALT 42 02/25/2019   AST 100 (H) 02/25/2019   NA 143 02/25/2019   K 3.6 02/25/2019   CL 110 02/25/2019   CREATININE 1.03 02/25/2019   BUN 22 (H) 02/25/2019   CO2 24 02/25/2019   INR 1.1 02/24/2019   HGBA1C 6.0 (H) 02/23/2019   Remains on vent sedated, opens yesys and follows commands Cr /Belinda Blockfunction stable     EGrace IsaacMD  Beeper [phone] [phone]/28/2020 5:30 PM

## 2019-02-26 ENCOUNTER — Inpatient Hospital Stay (HOSPITAL_COMMUNITY): Payer: Medicare Other

## 2019-02-26 DIAGNOSIS — I200 Unstable angina: Secondary | ICD-10-CM

## 2019-02-26 LAB — COMPREHENSIVE METABOLIC PANEL
ALT: 41 U/L (ref 0–44)
AST: 70 U/L — ABNORMAL HIGH (ref 15–41)
Albumin: 2.4 g/dL — ABNORMAL LOW (ref 3.5–5.0)
Alkaline Phosphatase: 40 U/L (ref 38–126)
Anion gap: 7 (ref 5–15)
BUN: 24 mg/dL — ABNORMAL HIGH (ref 6–20)
CO2: 27 mmol/L (ref 22–32)
Calcium: 8.6 mg/dL — ABNORMAL LOW (ref 8.9–10.3)
Chloride: 107 mmol/L (ref 98–111)
Creatinine, Ser: 1.06 mg/dL (ref 0.61–1.24)
GFR calc Af Amer: >60 mL/min (ref >60)
GFR calc non Af Amer: >60 mL/min (ref >60)
Glucose, Bld: 172 mg/dL — ABNORMAL HIGH (ref 70–99)
Potassium: 3.5 mmol/L (ref 3.5–5.1)
Sodium: 141 mmol/L (ref 135–145)
Total Bilirubin: 0.8 mg/dL (ref 0.3–1.2)
Total Protein: 4.7 g/dL — ABNORMAL LOW (ref 6.5–8.1)

## 2019-02-26 LAB — CBC
HCT: 27.7 % — ABNORMAL LOW (ref 39.0–52.0)
Hemoglobin: 9.1 g/dL — ABNORMAL LOW (ref 13.0–17.0)
MCH: 28.9 pg (ref 26.0–34.0)
MCHC: 32.9 g/dL (ref 30.0–36.0)
MCV: 87.9 fL (ref 80.0–100.0)
Platelets: 67 10*3/uL — ABNORMAL LOW (ref 150–400)
RBC: 3.15 MIL/uL — ABNORMAL LOW (ref 4.22–5.81)
RDW: 14.2 % (ref 11.5–15.5)
WBC: 10.7 10*3/uL — ABNORMAL HIGH (ref 4.0–10.5)
nRBC: 0 % (ref 0.0–0.2)

## 2019-02-26 LAB — POCT I-STAT 7, (LYTES, BLD GAS, ICA,H+H)
Acid-Base Excess: 6 mmol/L — ABNORMAL HIGH (ref 0.0–2.0)
Acid-Base Excess: 8 mmol/L — ABNORMAL HIGH (ref 0.0–2.0)
Acid-Base Excess: 8 mmol/L — ABNORMAL HIGH (ref 0.0–2.0)
Bicarbonate: 29.7 mmol/L — ABNORMAL HIGH (ref 20.0–28.0)
Bicarbonate: 30.8 mmol/L — ABNORMAL HIGH (ref 20.0–28.0)
Bicarbonate: 31.5 mmol/L — ABNORMAL HIGH (ref 20.0–28.0)
Calcium, Ion: 1.22 mmol/L (ref 1.15–1.40)
Calcium, Ion: 1.19 mmol/L (ref 1.15–1.40)
Calcium, Ion: 1.21 mmol/L (ref 1.15–1.40)
HCT: 34 % — ABNORMAL LOW (ref 39.0–52.0)
HCT: 26 % — ABNORMAL LOW (ref 39.0–52.0)
HCT: 27 % — ABNORMAL LOW (ref 39.0–52.0)
Hemoglobin: 11.6 g/dL — ABNORMAL LOW (ref 13.0–17.0)
Hemoglobin: 8.8 g/dL — ABNORMAL LOW (ref 13.0–17.0)
Hemoglobin: 9.2 g/dL — ABNORMAL LOW (ref 13.0–17.0)
O2 Saturation: 97 %
O2 Saturation: 96 %
O2 Saturation: 97 %
Potassium: 3.2 mmol/L — ABNORMAL LOW (ref 3.5–5.1)
Potassium: 3.4 mmol/L — ABNORMAL LOW (ref 3.5–5.1)
Potassium: 3.4 mmol/L — ABNORMAL LOW (ref 3.5–5.1)
Sodium: 140 mmol/L (ref 135–145)
Sodium: 140 mmol/L (ref 135–145)
Sodium: 137 mmol/L (ref 135–145)
TCO2: 31 mmol/L (ref 22–32)
TCO2: 32 mmol/L (ref 22–32)
TCO2: 33 mmol/L — ABNORMAL HIGH (ref 22–32)
pCO2 arterial: 36.9 mmHg (ref 32.0–48.0)
pCO2 arterial: 35.4 mmHg (ref 32.0–48.0)
pCO2 arterial: 36.7 mmHg (ref 32.0–48.0)
pH, Arterial: 7.514 — ABNORMAL HIGH (ref 7.350–7.450)
pH, Arterial: 7.547 — ABNORMAL HIGH (ref 7.350–7.450)
pH, Arterial: 7.541 — ABNORMAL HIGH (ref 7.350–7.450)
pO2, Arterial: 80 mmHg — ABNORMAL LOW (ref 83.0–108.0)
pO2, Arterial: 69 mmHg — ABNORMAL LOW (ref 83.0–108.0)
pO2, Arterial: 83 mmHg (ref 83.0–108.0)

## 2019-02-26 LAB — BLOOD GAS, ARTERIAL
Acid-Base Excess: 5.8 mmol/L — ABNORMAL HIGH (ref 0.0–2.0)
Bicarbonate: 29.3 mmol/L — ABNORMAL HIGH (ref 20.0–28.0)
FIO2: 40
MECHVT: 600 mL
O2 Saturation: 98.6 %
PEEP: 5 cmH2O
Patient temperature: 98.6
Pressure support: 10 cmH2O
RATE: 16 resp/min
pCO2 arterial: 39 mmHg (ref 32.0–48.0)
pH, Arterial: 7.488 — ABNORMAL HIGH (ref 7.350–7.450)
pO2, Arterial: 121 mmHg — ABNORMAL HIGH (ref 83.0–108.0)

## 2019-02-26 LAB — CULTURE, RESPIRATORY W GRAM STAIN: Special Requests: NORMAL

## 2019-02-26 LAB — GLUCOSE, CAPILLARY
Glucose-Capillary: 122 mg/dL — ABNORMAL HIGH (ref 70–99)
Glucose-Capillary: 123 mg/dL — ABNORMAL HIGH (ref 70–99)
Glucose-Capillary: 124 mg/dL — ABNORMAL HIGH (ref 70–99)
Glucose-Capillary: 128 mg/dL — ABNORMAL HIGH (ref 70–99)
Glucose-Capillary: 142 mg/dL — ABNORMAL HIGH (ref 70–99)
Glucose-Capillary: 151 mg/dL — ABNORMAL HIGH (ref 70–99)
Glucose-Capillary: 162 mg/dL — ABNORMAL HIGH (ref 70–99)

## 2019-02-26 LAB — COOXEMETRY PANEL
Carboxyhemoglobin: 1 % (ref 0.5–1.5)
Methemoglobin: 1.5 % (ref 0.0–1.5)
O2 Saturation: 46.7 %
Total hemoglobin: 13.2 g/dL (ref 12.0–16.0)

## 2019-02-26 LAB — POCT ACTIVATED CLOTTING TIME: Activated Clotting Time: 263 seconds

## 2019-02-26 MED ORDER — POTASSIUM CHLORIDE 10 MEQ/50ML IV SOLN
10.0000 meq | INTRAVENOUS | Status: AC
  Administered 2019-02-26 (×3): 10 meq via INTRAVENOUS
  Filled 2019-02-26 (×3): qty 50

## 2019-02-26 MED ORDER — POTASSIUM CHLORIDE 20 MEQ/15ML (10%) PO SOLN
20.0000 meq | ORAL | Status: DC | PRN
  Administered 2019-02-26: 20 meq
  Filled 2019-02-26: qty 15

## 2019-02-26 MED ORDER — LEVALBUTEROL HCL 0.63 MG/3ML IN NEBU
0.6300 mg | INHALATION_SOLUTION | Freq: Three times a day (TID) | RESPIRATORY_TRACT | Status: DC
  Administered 2019-02-26 – 2019-03-01 (×8): 0.63 mg via RESPIRATORY_TRACT
  Filled 2019-02-26 (×8): qty 3

## 2019-02-26 MED ORDER — ORAL CARE MOUTH RINSE
15.0000 mL | Freq: Two times a day (BID) | OROMUCOSAL | Status: DC
  Administered 2019-02-26 – 2019-03-05 (×8): 15 mL via OROMUCOSAL

## 2019-02-26 MED ORDER — METOPROLOL TARTRATE 5 MG/5ML IV SOLN
INTRAVENOUS | Status: AC
  Filled 2019-02-26: qty 5

## 2019-02-26 MED ORDER — METOPROLOL TARTRATE 5 MG/5ML IV SOLN
2.5000 mg | Freq: Once | INTRAVENOUS | Status: AC
  Administered 2019-02-26: 2.5 mg via INTRAVENOUS

## 2019-02-26 MED ORDER — METOPROLOL TARTRATE 25 MG PO TABS
25.0000 mg | ORAL_TABLET | Freq: Two times a day (BID) | ORAL | Status: DC
  Administered 2019-02-26 – 2019-02-27 (×3): 25 mg via ORAL
  Filled 2019-02-26 (×3): qty 1

## 2019-02-26 NOTE — Progress Notes (Signed)
NIF -40. VC 2.0L. Great patient effort.

## 2019-02-26 NOTE — Progress Notes (Signed)
Patient ID: [name] [name], male   DOB: 09-Sep-1961, 58 y.o.   MRN: [id number] EVENING ROUNDS NOTE :     [address]       ,[place] [address]             [phone]                 3 Days Post-Op Procedure(s) (LRB): REPAIR OF TYPE A  - ACUTE ASCENDING THORACIC AORTIC DISSECTION, using Hemashield Platinum Woven Double Velour Vascular Graft (D: 79m, L: 30cm) (N/A) CORONARY ARTERY BYPASS GRAFTING (CABG) x 1; Using Endoscopically Harvested Right Leg Greater Saphenous Vein Graft (SVG); SVG to RCA (N/A) TRANSESOPHAGEAL ECHOCARDIOGRAM (TEE) (N/A)  Total Length of Stay:  LOS: 3 days  BP (!) 176/64   Pulse (!) 101   Temp 99.9 F (37.7 C)   Resp (!) 21   Ht 5' 8"  (1.727 m)   Wt 117.2 kg   SpO2 98%   BMI 39.29 kg/m   .Intake/Output      02/29 0701 - 03/01 0700   P.O. 360   I.V. (mL/kg) 860.2 (7.3)   NG/GT    IV Piggyback 648.7   Total Intake(mL/kg) 1869 (15.9)   Urine (mL/kg/hr) 3690 (2.6)   Emesis/NG output 100   Stool 0   Chest Tube 130   Total Output 3920   Net -2051.1       Stool Occurrence 2 x     . sodium chloride    . sodium chloride    . sodium chloride 20 mL/hr at 02/24/19 0500  . dexmedetomidine (PRECEDEX) IV infusion Stopped (02/26/19 0842)  . furosemide (LASIX) infusion 10 mg/hr (02/26/19 1800)  . lactated ringers 20 mL/hr at 02/24/19 1400  . lactated ringers 20 mL/hr at 02/26/19 1800  . milrinone 0.25 mcg/kg/min (02/26/19 1800)  . norepinephrine (LEVOPHED) Adult infusion Stopped (02/25/19 1603)  . potassium chloride    . vancomycin Stopped (02/26/19 1223)     Lab Results  Component Value Date   WBC 10.7 (H) 02/26/2019   HGB 9.2 (L) 02/26/2019   HCT 27.0 (L) 02/26/2019   PLT 67 (L) 02/26/2019   GLUCOSE 172 (H) 02/26/2019   CHOL 156 02/23/2019   TRIG 101 02/23/2019   HDL 31 (L) 02/23/2019   LDLCALC 105 (H) 02/23/2019   ALT 41 02/26/2019   AST 70 (H) 02/26/2019   NA 137 02/26/2019   K 3.4 (L) 02/26/2019   CL 107 02/26/2019     CREATININE 1.06 02/26/2019   BUN 24 (H) 02/26/2019   CO2 27 02/26/2019   INR 1.1 02/24/2019   HGBA1C 6.0 (H) 02/23/2019   tolerating extubation Replacing kcl bp elevated- started on beta blocker Decrease lasix drip to 5 mg/hr    EGrace IsaacMD  Beeper [phone] [phone]/29/2020 7:07 PM

## 2019-02-26 NOTE — Progress Notes (Signed)
Patient ID: [name] [name], male   DOB: 09/27/1961, 57 y.o.   MRN: [id number] [facility] DAILY ICU PROGRESS NOTE                   [address]            [place] [address]          [phone]   3 Days Post-Op Procedure(s) (LRB): REPAIR OF TYPE A  - ACUTE ASCENDING THORACIC AORTIC DISSECTION, using Hemashield Platinum Woven Double Velour Vascular Graft (D: 26mm, L: 30cm) (N/A) CORONARY ARTERY BYPASS GRAFTING (CABG) x 1; Using Endoscopically Harvested Right Leg Greater Saphenous Vein Graft (SVG); SVG to RCA (N/A) TRANSESOPHAGEAL ECHOCARDIOGRAM (TEE) (N/A)  Total Length of Stay:  LOS: 3 days   Subjective: Awake and alert, neuro intact, comfortable on vent   Objective: Vital signs in last 24 hours: Temp:  [99.1 F (37.3 C)-99.9 F (37.7 C)] 99.9 F (37.7 C) (02/29 0615) Pulse Rate:  [42-111] 111 (02/29 0749) Resp:  [0-27] 27 (02/29 0749) BP: (104-150)/(41-70) 116/58 (02/29 0600) SpO2:  [95 %-100 %] 98 % (02/29 0749) Arterial Line BP: (101-183)/(42-97) 125/54 (02/29 0615) FiO2 (%):  [40 %-50 %] 40 % (02/29 0749) Weight:  [117.2 kg] 117.2 kg (02/29 0102)  Filed Weights   02/22/19 1655 02/25/19 0500 02/26/19 0102  Weight: 110.2 kg 120.1 kg 117.2 kg    Weight change: -2.9 kg   Hemodynamic parameters for last 24 hours: PAP: (20-36)/(10-23) 29/19 CVP:  [5 mmHg-19 mmHg] 15 mmHg CO:  [7.1 L/min-8.2 L/min] 7.1 L/min CI:  [3.2 L/min/m2-3.7 L/min/m2] 3.2 L/min/m2  Intake/Output from previous day: 02/28 0701 - 02/29 0700 In: 2578 [I.V.:1165.2; NG/GT:460; IV Piggyback:952.8] Out: 4860 [Urine:4050; Emesis/NG output:550; Chest Tube:260]  Intake/Output this shift: No intake/output data recorded.  Current Meds: Scheduled Meds: . sodium chloride   Intravenous Once  . acetaminophen  1,000 mg Oral Q6H   Or  . acetaminophen (TYLENOL) oral liquid 160 mg/5 mL  1,000 mg Per Tube Q6H  . allopurinol  100 mg Oral q morning - 10a  . amiodarone  200 mg Oral BID  .  aspirin EC  325 mg Oral Daily   Or  . aspirin  324 mg Per Tube Daily  . atorvastatin  40 mg Oral q1800  . bisacodyl  10 mg Oral Daily   Or  . bisacodyl  10 mg Rectal Daily  . budesonide (PULMICORT) nebulizer solution  0.5 mg Nebulization BID  . chlorhexidine gluconate (MEDLINE KIT)  15 mL Mouth Rinse BID  . Chlorhexidine Gluconate Cloth  6 each Topical Daily  . docusate sodium  200 mg Oral Daily  . insulin aspart  0-24 Units Subcutaneous Q4H  . insulin detemir  14 Units Subcutaneous BID  . levalbuterol  0.63 mg Nebulization TID  . mouth rinse  15 mL Mouth Rinse 10 times per day  . metolazone  10 mg Oral Daily  . multivitamin with minerals  1 tablet Oral Daily  . mupirocin ointment   Nasal BID  . pantoprazole  40 mg Oral Daily  . sodium chloride flush  10-40 mL Intracatheter Q12H  . sodium chloride flush  3 mL Intravenous Q12H  . sodium chloride flush  3 mL Intravenous Q12H   Continuous Infusions: . sodium chloride    . sodium chloride    . sodium chloride    . sodium chloride 20 mL/hr at 02/24/19 0500  . dexmedetomidine (PRECEDEX) IV infusion 0.7 mcg/kg/hr (02/26/19 0352)  .   fentaNYL infusion INTRAVENOUS 50 mcg/hr (02/26/19 0000)  . furosemide (LASIX) infusion 10 mg/hr (02/26/19 0515)  . insulin Stopped (02/25/19 1135)  . lactated ringers 20 mL/hr at 02/24/19 1400  . lactated ringers 20 mL/hr at 02/26/19 0000  . milrinone 0.25 mcg/kg/min (02/26/19 0159)  . norepinephrine (LEVOPHED) Adult infusion Stopped (02/25/19 1603)  . vancomycin Stopped (02/25/19 2216)   PRN Meds:.sodium chloride, sodium chloride, acetaminophen, fentaNYL, ipratropium-albuterol, midazolam, morphine injection, ondansetron (ZOFRAN) IV, oxyCODONE, potassium chloride, sodium chloride flush, sodium chloride flush, sodium chloride flush, traMADol  General appearance: alert, cooperative and no distress Neurologic: intact Heart: regular rate and rhythm, S1, S2 normal, no murmur, click, rub or gallop Lungs:  diminished breath sounds bibasilar Abdomen: soft, non-tender; bowel sounds normal; no masses,  no organomegaly Extremities: extremities normal, atraumatic, no cyanosis or edema and Homans sign is negative, no sign of DVT Wound: sternum intact  Lab Results: CBC: Recent Labs    02/25/19 0421  02/25/19 1602 02/26/19 0206  WBC 12.4*  --   --  10.7*  HGB 10.5*   < > 8.5* 9.1*  HCT 29.8*   < > 25.0* 27.7*  PLT 92*  --   --  67*   < > = values in this interval not displayed.   BMET:  Recent Labs    02/25/19 1522 02/25/19 1602 02/26/19 0206  NA 141 143 141  K 3.6 3.6 3.5  CL 110  --  107  CO2 24  --  27  GLUCOSE 202*  --  172*  BUN 22*  --  24*  CREATININE 1.03  --  1.06  CALCIUM 8.3*  --  8.6*    CMET: Lab Results  Component Value Date   WBC 10.7 (H) 02/26/2019   HGB 9.1 (L) 02/26/2019   HCT 27.7 (L) 02/26/2019   PLT 67 (L) 02/26/2019   GLUCOSE 172 (H) 02/26/2019   CHOL 156 02/23/2019   TRIG 101 02/23/2019   HDL 31 (L) 02/23/2019   LDLCALC 105 (H) 02/23/2019   ALT 41 02/26/2019   AST 70 (H) 02/26/2019   NA 141 02/26/2019   K 3.5 02/26/2019   CL 107 02/26/2019   CREATININE 1.06 02/26/2019   BUN 24 (H) 02/26/2019   CO2 27 02/26/2019   INR 1.1 02/24/2019   HGBA1C 6.0 (H) 02/23/2019      PT/INR:  Recent Labs    02/24/19 0506  LABPROT 14.3  INR 1.1   Radiology: Us Ekg Site Rite  Result Date: 02/25/2019 If Site Rite image not attached, placement could not be confirmed due to current cardiac rhythm.    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 

## 2019-02-26 NOTE — Plan of Care (Signed)

## 2019-02-26 NOTE — Progress Notes (Signed)
Pharmacy Heparin Induced Thrombocytopenia (HIT) Note:  [name] [name] is an 58 y.o. male being evaluated for HIT. Heparin was started 2/25 for DVT prophylaxis, and baseline platelets were 240.   HIT labs were ordered on 2/29 when platelets dropped to 67.  Auto-populate labs: No results found for: HEPINDPLTAB, SRALOWDOSEHP, SRAHIGHDOSEH    CALCULATE SCORE:  4Ts (see the HIT Algorithm) Score  Thrombocytopenia 1  Timing 1  Thrombosis 0  Other causes of thrombocytopenia 0  Total 2     Recommendations (A or B or C) are based on available lab results:  A. No lab results available (HIT antibody and/or SRA ordered): -Low HIT probability: consider discontinuing HIT labs, continue heparin/LMWH, SRA not recommended; no heparin allergy documentation needed  B. HIT antibody result available: -N/A - HIT antibody not available, and/or SRA not available  C. SRA result available -SRA not available  Name of MD Contacted: none  Plan (Discussed with provider) Labs ordered: -Heparin antibody Heparin allergy: -documented or updated Anticoagulation plans - Not currently on anticoagulation.   [name] [name] 02/26/2019, 1:11 PM

## 2019-02-26 NOTE — Procedures (Signed)
Extubation Procedure Note  Patient Details:   Name: [name] [name] DOB: 01-04-1961 MRN: [id number]   Airway Documentation:  Airway 7.5 mm (Active)  Secured at (cm) 23 cm 02/26/2019  7:45 AM  Measured From Lips 02/26/2019  7:45 AM  [place] 02/26/2019  7:45 AM  Secured By [employer] 02/26/2019  7:45 AM  Tube Holder Repositioned Yes 02/26/2019  7:45 AM  Cuff Pressure (cm H2O) 25 cm H2O 02/26/2019  7:45 AM  Site Condition Dry 02/26/2019  7:45 AM   Vent end date: (not recorded) Vent end time: (not recorded)   Evaluation  O2 sats: stable throughout Complications: No apparent complications Patient did tolerate procedure well. Bilateral Breath Sounds: Clear   Yes, patient able to speak.   Patient extubated at 0932 to 4L nasal cannula. Patient tolerated procedure well. Vitals normal. RT will continue to monitor.   Seward Speck 02/26/2019, 9:35 AM

## 2019-02-27 ENCOUNTER — Inpatient Hospital Stay (HOSPITAL_COMMUNITY): Payer: Medicare Other

## 2019-02-27 DIAGNOSIS — I480 Paroxysmal atrial fibrillation: Secondary | ICD-10-CM

## 2019-02-27 DIAGNOSIS — Z951 Presence of aortocoronary bypass graft: Secondary | ICD-10-CM

## 2019-02-27 DIAGNOSIS — I7101 Dissection of thoracic aorta: Secondary | ICD-10-CM

## 2019-02-27 LAB — BPAM RBC
BLOOD PRODUCT EXPIRATION DATE: 202003192359
Blood Product Expiration Date: 202003192359
Blood Product Expiration Date: 202003192359
Blood Product Expiration Date: 202003212359
Blood Product Expiration Date: 202003212359
Blood Product Expiration Date: 202003212359
Blood Product Expiration Date: 202003212359
Blood Product Expiration Date: 202003222359
Blood Product Expiration Date: 202003222359
Blood Product Expiration Date: 202003222359
Blood Product Expiration Date: 202003232359
Blood Product Expiration Date: 202003232359
Blood Product Expiration Date: 202003232359
ISSUE DATE / TIME: 202002261510
ISSUE DATE / TIME: 202002262359
ISSUE DATE / TIME: 202002262359
ISSUE DATE / TIME: 202002262359
ISSUE DATE / TIME: 202002270021
ISSUE DATE / TIME: 202002270021
ISSUE DATE / TIME: 202002270021
ISSUE DATE / TIME: 202002270021
ISSUE DATE / TIME: 202002270043
ISSUE DATE / TIME: 202002270043
ISSUE DATE / TIME: 202002270043
ISSUE DATE / TIME: 202002262359
ISSUE DATE / TIME: 202002270043
UNIT TYPE AND RH: 5100
UNIT TYPE AND RH: 5100
UNIT TYPE AND RH: 5100
Unit Type and Rh: 5100
Unit Type and Rh: 5100
Unit Type and Rh: 5100
Unit Type and Rh: 5100
Unit Type and Rh: 5100
Unit Type and Rh: 5100
Unit Type and Rh: 5100
Unit Type and Rh: 5100
Unit Type and Rh: 5100
Unit Type and Rh: 5100

## 2019-02-27 LAB — PREPARE FRESH FROZEN PLASMA
Unit division: 0
Unit division: 0
Unit division: 0
Unit division: 0
Unit division: 0
Unit division: 0

## 2019-02-27 LAB — TYPE AND SCREEN
ABO/RH(D): O POS
Antibody Screen: NEGATIVE
Unit division: 0
Unit division: 0
Unit division: 0
Unit division: 0
Unit division: 0
Unit division: 0
Unit division: 0
Unit division: 0
Unit division: 0
Unit division: 0
Unit division: 0
Unit division: 0
Unit division: 0

## 2019-02-27 LAB — GLUCOSE, CAPILLARY
Glucose-Capillary: 108 mg/dL — ABNORMAL HIGH (ref 70–99)
Glucose-Capillary: 114 mg/dL — ABNORMAL HIGH (ref 70–99)
Glucose-Capillary: 124 mg/dL — ABNORMAL HIGH (ref 70–99)
Glucose-Capillary: 124 mg/dL — ABNORMAL HIGH (ref 70–99)
Glucose-Capillary: 147 mg/dL — ABNORMAL HIGH (ref 70–99)
Glucose-Capillary: 154 mg/dL — ABNORMAL HIGH (ref 70–99)

## 2019-02-27 LAB — BPAM FFP
Blood Product Expiration Date: 202003022359
Blood Product Expiration Date: 202003022359
Blood Product Expiration Date: 202003022359
Blood Product Expiration Date: 202003032359
Blood Product Expiration Date: 202003032359
Blood Product Expiration Date: 202003032359
Blood Product Expiration Date: 202003032359
ISSUE DATE / TIME: 202002262157
ISSUE DATE / TIME: 202002262157
ISSUE DATE / TIME: 202002262157
ISSUE DATE / TIME: 202002270107
ISSUE DATE / TIME: 202002270107
ISSUE DATE / TIME: 202002270107
ISSUE DATE / TIME: 202002270107
Unit Type and Rh: 5100
Unit Type and Rh: 5100
Unit Type and Rh: 5100
Unit Type and Rh: 5100
Unit Type and Rh: 5100
Unit Type and Rh: 5100
Unit Type and Rh: 9500

## 2019-02-27 LAB — CBC
HCT: 28.8 % — ABNORMAL LOW (ref 39.0–52.0)
Hemoglobin: 9.3 g/dL — ABNORMAL LOW (ref 13.0–17.0)
MCH: 28.8 pg (ref 26.0–34.0)
MCHC: 32.3 g/dL (ref 30.0–36.0)
MCV: 89.2 fL (ref 80.0–100.0)
Platelets: 120 10*3/uL — ABNORMAL LOW (ref 150–400)
RBC: 3.23 MIL/uL — ABNORMAL LOW (ref 4.22–5.81)
RDW: 13.9 % (ref 11.5–15.5)
WBC: 14.1 10*3/uL — ABNORMAL HIGH (ref 4.0–10.5)
nRBC: 0.5 % — ABNORMAL HIGH (ref 0.0–0.2)

## 2019-02-27 LAB — COOXEMETRY PANEL
Carboxyhemoglobin: 2.1 % — ABNORMAL HIGH (ref 0.5–1.5)
Methemoglobin: 0.8 % (ref 0.0–1.5)
O2 Saturation: 61.4 %
Total hemoglobin: 13.2 g/dL (ref 12.0–16.0)

## 2019-02-27 LAB — COMPREHENSIVE METABOLIC PANEL
ALT: 46 U/L — ABNORMAL HIGH (ref 0–44)
AST: 67 U/L — ABNORMAL HIGH (ref 15–41)
Albumin: 2.6 g/dL — ABNORMAL LOW (ref 3.5–5.0)
Alkaline Phosphatase: 75 U/L (ref 38–126)
Anion gap: 13 (ref 5–15)
BUN: 20 mg/dL (ref 6–20)
CO2: 31 mmol/L (ref 22–32)
Calcium: 8.7 mg/dL — ABNORMAL LOW (ref 8.9–10.3)
Chloride: 93 mmol/L — ABNORMAL LOW (ref 98–111)
Creatinine, Ser: 0.89 mg/dL (ref 0.61–1.24)
GFR calc Af Amer: >60 mL/min (ref >60)
GFR calc non Af Amer: >60 mL/min (ref >60)
Glucose, Bld: 117 mg/dL — ABNORMAL HIGH (ref 70–99)
Potassium: 3.1 mmol/L — ABNORMAL LOW (ref 3.5–5.1)
Sodium: 137 mmol/L (ref 135–145)
Total Bilirubin: 0.8 mg/dL (ref 0.3–1.2)
Total Protein: 5.6 g/dL — ABNORMAL LOW (ref 6.5–8.1)

## 2019-02-27 LAB — TROPONIN I
Troponin I: 1.95 ng/mL (ref <0.03)
Troponin I: 1.59 ng/mL (ref <0.03)
Troponin I: 1.28 ng/mL (ref <0.03)

## 2019-02-27 LAB — TSH: TSH: 4.862 u[IU]/mL — ABNORMAL HIGH (ref 0.350–4.500)

## 2019-02-27 LAB — MAGNESIUM: Magnesium: 1.9 mg/dL (ref 1.7–2.4)

## 2019-02-27 LAB — HEPARIN INDUCED PLATELET AB (HIT ANTIBODY): Heparin Induced Plt Ab: 0.156 OD (ref 0.000–0.400)

## 2019-02-27 MED ORDER — AMIODARONE HCL IN DEXTROSE 360-4.14 MG/200ML-% IV SOLN
60.0000 mg/h | INTRAVENOUS | Status: DC
  Administered 2019-02-27 – 2019-02-28 (×3): 60 mg/h via INTRAVENOUS
  Filled 2019-02-27 (×3): qty 200

## 2019-02-27 MED ORDER — VANCOMYCIN HCL 10 G IV SOLR
1500.0000 mg | Freq: Two times a day (BID) | INTRAVENOUS | Status: AC
  Administered 2019-02-27 – 2019-02-28 (×4): 1500 mg via INTRAVENOUS
  Filled 2019-02-27 (×4): qty 1500

## 2019-02-27 MED ORDER — AMIODARONE HCL 200 MG PO TABS
400.0000 mg | ORAL_TABLET | Freq: Two times a day (BID) | ORAL | Status: DC
  Administered 2019-02-28 – 2019-03-05 (×11): 400 mg via ORAL
  Filled 2019-02-27 (×12): qty 2

## 2019-02-27 MED ORDER — FENTANYL CITRATE (PF) 100 MCG/2ML IJ SOLN
INTRAMUSCULAR | Status: AC
  Filled 2019-02-27: qty 2

## 2019-02-27 MED ORDER — AMIODARONE IV BOLUS ONLY 150 MG/100ML
150.0000 mg | Freq: Once | INTRAVENOUS | Status: AC
  Administered 2019-02-27: 150 mg via INTRAVENOUS

## 2019-02-27 MED ORDER — MIDAZOLAM HCL 2 MG/2ML IJ SOLN
INTRAMUSCULAR | Status: AC
  Filled 2019-02-27: qty 2

## 2019-02-27 MED ORDER — POTASSIUM CHLORIDE 10 MEQ/50ML IV SOLN
10.0000 meq | INTRAVENOUS | Status: AC
  Administered 2019-02-27 (×3): 10 meq via INTRAVENOUS
  Filled 2019-02-27 (×4): qty 50

## 2019-02-27 MED ORDER — AMIODARONE IV BOLUS ONLY 150 MG/100ML: 150.0000 mg | Freq: Once | INTRAVENOUS | Status: DC

## 2019-02-27 MED ORDER — AMIODARONE HCL IN DEXTROSE 360-4.14 MG/200ML-% IV SOLN
INTRAVENOUS | Status: AC
  Filled 2019-02-27: qty 200

## 2019-02-27 MED ORDER — AMIODARONE IV BOLUS ONLY 150 MG/100ML
150.0000 mg | Freq: Once | INTRAVENOUS | Status: AC
  Administered 2019-02-27: 150 mg via INTRAVENOUS
  Filled 2019-02-27: qty 100

## 2019-02-27 MED ORDER — AMIODARONE HCL IN DEXTROSE 360-4.14 MG/200ML-% IV SOLN
60.0000 mg/h | INTRAVENOUS | Status: AC
  Administered 2019-02-27 (×3): 60 mg/h via INTRAVENOUS
  Filled 2019-02-27 (×2): qty 200

## 2019-02-27 MED ORDER — AMIODARONE LOAD VIA INFUSION
150.0000 mg | Freq: Once | INTRAVENOUS | Status: AC
  Administered 2019-02-27: 150 mg via INTRAVENOUS
  Filled 2019-02-27: qty 83.34

## 2019-02-27 MED ORDER — MAGNESIUM SULFATE 4 GM/100ML IV SOLN
INTRAVENOUS | Status: AC
  Filled 2019-02-27: qty 100

## 2019-02-27 MED ORDER — POTASSIUM CHLORIDE 10 MEQ/50ML IV SOLN
10.0000 meq | Freq: Once | INTRAVENOUS | Status: AC
  Administered 2019-02-27: 10 meq via INTRAVENOUS

## 2019-02-27 NOTE — Progress Notes (Addendum)
At 2330 pt c/o L shoulder pain described at discomfort and increases in severity with movement. Pt given scheduled tylenol and applied heat pack to L shoulder. L radial pulse palpable. At midnight, pt had no resolution of shoulder discomfort. 12 lead EKG performed and read **ACUTE MI**. Compared with prior EKG, performed on 02/24/18 and this reads new.   Paged Dr. Servando Snare, orders given for 100 mg Ultram and send STAT Troponin and repeat q6h x3. Orders initiated. Continuing to monitor closely.   CRITICAL VALUE ALERT  Critical Value:  Troponin 1.95  Date & Time Notied:  02/27/19 1:20 AM   Provider Notified: Dr. Servando Snare  Orders Received/Actions taken: No new orders. Continue to monitor trend. Pt reports improved L shoulder pain- now asleep.

## 2019-02-27 NOTE — Progress Notes (Addendum)
At [place] with oncoming shift, [facility] RN, stood pt to get AM weight and transfer to chair. Pt then went into rapid SVT/Afib? Rates 170-21. Dr. Servando Snare paged, received lab orders for Mg and TSH. Amio bolus and infusion. AM Potassium 3.1, replaced per protocol.

## 2019-02-27 NOTE — Progress Notes (Signed)
Patient ID: [name] [name], male   DOB: 1961/01/07, 58 y.o.   MRN: [id number] EVENING ROUNDS NOTE :     [address]       [place] [address]             [phone]                 4 Days Post-Op Procedure(s) (LRB): REPAIR OF TYPE A  - ACUTE ASCENDING THORACIC AORTIC DISSECTION, using Hemashield Platinum Woven Double Velour Vascular Graft (D: 64m, L: 30cm) (N/A) CORONARY ARTERY BYPASS GRAFTING (CABG) x 1; Using Endoscopically Harvested Right Leg Greater Saphenous Vein Graft (SVG); SVG to RCA (N/A) TRANSESOPHAGEAL ECHOCARDIOGRAM (TEE) (N/A)  Total Length of Stay:  LOS: 4 days  BP (!) 156/68   Pulse 81   Temp 98.5 F (36.9 C) (Oral)   Resp 15   Ht 5' 8"  (1.727 m)   Wt 117.2 kg   SpO2 100%   BMI 39.29 kg/m   .Intake/Output      02/29 0701 - 03/01 0700 03/01 0701 - 03/02 0700   P.O. 360 630   I.V. (mL/kg) 1251.9 (10.7) 662.5 (5.7)   NG/GT     IV Piggyback 1287.7 673.3   Total Intake(mL/kg) 2899.5 (24.7) 1965.8 (16.8)   Urine (mL/kg/hr) 6160 (2.2) 750 (0.5)   Emesis/NG output 100 0   Stool 0    Chest Tube 178 110   Total Output 6438 860   Net -3538.5 +1105.8        Stool Occurrence 2 x    Emesis Occurrence 1 x 1 x     . sodium chloride    . sodium chloride    . sodium chloride 20 mL/hr at 02/24/19 0500  . amiodarone 60 mg/hr (02/27/19 1600)  . dexmedetomidine (PRECEDEX) IV infusion Stopped (02/26/19 0842)  . furosemide (LASIX) infusion 5 mg/hr (02/27/19 1815)  . lactated ringers 20 mL/hr at 02/24/19 1400  . lactated ringers 20 mL/hr at 02/27/19 1600  . norepinephrine (LEVOPHED) Adult infusion Stopped (02/25/19 1603)  . vancomycin Stopped (02/27/19 1508)     Lab Results  Component Value Date   WBC 14.1 (H) 02/27/2019   HGB 9.3 (L) 02/27/2019   HCT 28.8 (L) 02/27/2019   PLT 120 (L) 02/27/2019   GLUCOSE 117 (H) 02/27/2019   CHOL 156 02/23/2019   TRIG 101 02/23/2019   HDL 31 (L) 02/23/2019   LDLCALC 105 (H) 02/23/2019   ALT 46 (H)  02/27/2019   AST 67 (H) 02/27/2019   NA 137 02/27/2019   K 3.1 (L) 02/27/2019   CL 93 (L) 02/27/2019   CREATININE 0.89 02/27/2019   BUN 20 02/27/2019   CO2 31 02/27/2019   TSH 4.862 (H) 02/27/2019   INR 1.1 02/24/2019   HGBA1C 6.0 (H) 02/23/2019    Feels better now  iv Cordarone started and has converted to sinus Still on low dose milrinone and lasix drip   EGrace IsaacMD  Beeper [phone] [phone]/12/2018 6:40 PM

## 2019-02-27 NOTE — Progress Notes (Signed)
[facility] Rounding Note   Subjective:    POD #4 Repair of Type A aortic dissection and CABG with SVG to RCA  Was having shoulder pain last night with concern for ischemic etiology but pain worse on movement. Troponin low and trending down. Now with rapid AF this am. Seen by Dr. Servando Snare who started IV amio. Milrinone decreased to 0.125. Co-ox 61%  On lasix gtt at 5. Down 6 pounds overnight but still up about 15 pounds from pre-op weight (243 pounds).   I tried to rapid atrial pace him and he went in to rapid VT for 1-2 mins and broke spontaneously    Objective:   Weight Range:  Vital Signs:   Temp:  [98.2 F (36.8 C)-100 F (37.8 C)] 98.2 F (36.8 C) (03/01 0743) Pulse Rate:  [91-170] 170 (03/01 0800) Resp:  [11-22] 16 (03/01 0800) BP: (127-180)/(57-94) 141/86 (03/01 0800) SpO2:  [94 %-99 %] 97 % (03/01 0800) Arterial Line BP: (140-185)/(55-75) 179/66 (02/29 1600) FiO2 (%):  [40 %] 40 % (02/29 0847) Last BM Date: 02/26/19  Weight change: Filed Weights   02/22/19 1655 02/25/19 0500 02/26/19 0102  Weight: 110.2 kg 120.1 kg 117.2 kg    Intake/Output:   Intake/Output Summary (Last 24 hours) at 02/27/2019 0843 Last data filed at 02/27/2019 0743 Gross per 24 hour  Intake 2423.44 ml  Output 6193 ml  Net -3769.56 ml     Physical Exam: General:  Sitting in chair  No resp difficulty HEENT: normal Neck: supple. RIJ introducer. CVP up . Carotids 2+ bilat; no bruits. No lymphadenopathy or thryomegaly appreciated. Cor: PMI nondisplaced. Irregular tachy  Sternal dressing ok . CTs in place Lungs: clear decreased at bases Abdomen: obese  soft, nontender, nondistended. No hepatosplenomegaly. No bruits or masses. Hypoactive bowel sounds. Extremities: no cyanosis, clubbing, rash, 1+ edema  L shoulder with decreased ROM which is painful Neuro: alert & orientedx3, cranial nerves grossly intact. moves all 4 extremities w/o difficulty. Affect pleasant  Telemetry: AF/AFL  140-170 Personally reviewed   Labs: Basic Metabolic Panel: Recent Labs  Lab 02/24/19 0500 02/24/19 1022  02/25/19 0421  02/25/19 0500 02/25/19 1522  02/26/19 0206 02/26/19 0903 02/26/19 1046 02/26/19 1607 02/27/19 0501  NA 142 144   < >  --    < > 143 141   < > 141 140 140 137 137  K 4.1 3.8   < >  --    < > 3.2* 3.6   < > 3.5 3.2* 3.4* 3.4* 3.1*  CL 110 113*  --   --   --  111 110  --  107  --   --   --  93*  CO2 24 24  --   --   --  23 24  --  27  --   --   --  31  GLUCOSE 117* 109*  --   --   --  136* 202*  --  172*  --   --   --  117*  BUN 14 18  --   --   --  21* 22*  --  24*  --   --   --  20  CREATININE 0.91 1.03  --   --   --  1.03 1.03  --  1.06  --   --   --  0.89  CALCIUM 7.1* 7.4*  --   --   --  8.1* 8.3*  --  8.6*  --   --   --  8.7*  MG 1.7 2.3  --  2.2  --   --   --   --   --   --   --   --  1.9   < > = values in this interval not displayed.    Liver Function Tests: Recent Labs  Lab 02/23/19 1314 02/24/19 1022 02/25/19 0500 02/26/19 0206 02/27/19 0501  AST 28 105* 100* 70* 67*  ALT 39 41 42 41 46*  ALKPHOS 66 30* 28* 40 75  BILITOT 0.9 1.3* 0.6 0.8 0.8  PROT 6.1* 3.8* 4.2* 4.7* 5.6*  ALBUMIN 3.7 2.5* 2.4* 2.4* 2.6*   No results for input(s): LIPASE, AMYLASE in the last 168 hours. No results for input(s): AMMONIA in the last 168 hours.  CBC: Recent Labs  Lab 02/22/19 1257  02/24/19 0500 02/24/19 1022  02/25/19 0421  02/26/19 0206 02/26/19 0903 02/26/19 1046 02/26/19 1607 02/27/19 0501  WBC 6.9   < > 10.9* 12.0*  --  12.4*  --  10.7*  --   --   --  14.1*  NEUTROABS 3.6  --   --   --   --   --   --   --   --   --   --   --   HGB 14.0   < > 12.1* 12.0*   < > 10.5*   < > 9.1* 11.6* 8.8* 9.2* 9.3*  HCT 42.6   < > 35.0* 33.9*   < > 29.8*   < > 27.7* 34.0* 26.0* 27.0* 28.8*  MCV 87.5   < > 86.0 84.8  --  85.6  --  87.9  --   --   --  89.2  PLT 240   < > 128* 134*  --  92*  --  67*  --   --   --  120*   < > = values in this interval not  displayed.    Cardiac Enzymes: Recent Labs  Lab 02/22/19 2343 02/23/19 0458 02/23/19 1314 02/27/19 0024 02/27/19 0501  TROPONINI <0.03 <0.03 <0.03 1.95* 1.59*    BNP: BNP (last 3 results) Recent Labs    02/22/19 1230  BNP 50.0    ProBNP (last 3 results) No results for input(s): PROBNP in the last 8760 hours.    Other results:  Imaging: Dg Chest Port 1 View  Result Date: 02/27/2019 CLINICAL DATA:  Follow-up chest tube EXAM: PORTABLE CHEST 1 VIEW COMPARISON:  02/26/2011 FINDINGS: Right jugular sheath remains in place although the Swan-Ganz catheter has been removed. The endotracheal tube and gastric catheter have been removed as well. Right-sided PICC line and right thoracostomy catheter are noted in satisfactory position. No pneumothorax is seen. Mild right basilar atelectasis is noted. The left lung remains clear. IMPRESSION: No pneumothorax on the right. Mild right basilar atelectasis is noted. Electronically Signed   By: Inez Catalina M.D.   On: 02/27/2019 08:18   Dg Chest Port 1 View  Result Date: 02/26/2019 CLINICAL DATA:  History of aortic dissection EXAM: PORTABLE CHEST 1 VIEW COMPARISON:  02/25/2019 FINDINGS: Cardiac shadow is stable. Swan-Ganz catheter, endotracheal tube, gastric catheter and right thoracostomy tube are again identified and stable. The left thoracostomy tube and pericardial drain have been removed in the interval. Mediastinal drain remains in place. No pneumothorax is seen. The overall inspiratory effort is poor with mild left basilar atelectasis. New right-sided PICC line is noted at the cavoatrial junction. Postsurgical changes are again noted and stable. IMPRESSION: Mild left  basilar atelectasis. No pneumothorax following left-sided chest tube removal New right-sided PICC line in satisfactory position. Electronically Signed   By: Inez Catalina M.D.   On: 02/26/2019 08:55   [name] Ekg Site Rite  Result Date: 02/25/2019 If [facility] image not attached,  placement could not be confirmed due to current cardiac rhythm.     Medications:     Scheduled Medications: . sodium chloride   Intravenous Once  . acetaminophen  1,000 mg Oral Q6H   Or  . acetaminophen (TYLENOL) oral liquid 160 mg/5 mL  1,000 mg Per Tube Q6H  . allopurinol  100 mg Oral q morning - 10a  . amiodarone  400 mg Oral Q12H  . aspirin EC  325 mg Oral Daily   Or  . aspirin  324 mg Per Tube Daily  . atorvastatin  40 mg Oral q1800  . bisacodyl  10 mg Oral Daily   Or  . bisacodyl  10 mg Rectal Daily  . budesonide (PULMICORT) nebulizer solution  0.5 mg Nebulization BID  . Chlorhexidine Gluconate Cloth  6 each Topical Daily  . docusate sodium  200 mg Oral Daily  . insulin aspart  0-24 Units Subcutaneous Q4H  . insulin detemir  14 Units Subcutaneous BID  . levalbuterol  0.63 mg Nebulization TID  . mouth rinse  15 mL Mouth Rinse BID  . metoprolol tartrate  25 mg Oral BID  . multivitamin with minerals  1 tablet Oral Daily  . mupirocin ointment   Nasal BID  . pantoprazole  40 mg Oral Daily  . sodium chloride flush  10-40 mL Intracatheter Q12H  . sodium chloride flush  3 mL Intravenous Q12H  . sodium chloride flush  3 mL Intravenous Q12H     Infusions: . sodium chloride    . sodium chloride    . sodium chloride 20 mL/hr at 02/24/19 0500  . amiodarone 60 mg/hr (02/27/19 4098)   Followed by  . amiodarone    . amiodarone    . dexmedetomidine (PRECEDEX) IV infusion Stopped (02/26/19 0842)  . furosemide (LASIX) infusion 5 mg/hr (02/27/19 0700)  . lactated ringers 20 mL/hr at 02/24/19 1400  . lactated ringers 20 mL/hr at 02/27/19 0700  . norepinephrine (LEVOPHED) Adult infusion Stopped (02/25/19 1603)  . potassium chloride 10 mEq (02/27/19 0756)  . potassium chloride    . vancomycin Stopped (02/26/19 2140)     PRN Medications:  sodium chloride, acetaminophen, ipratropium-albuterol, morphine injection, ondansetron (ZOFRAN) IV, oxyCODONE, potassium chloride, sodium  chloride flush, sodium chloride flush, sodium chloride flush, traMADol   Studies:  Coronary Diagrams   Diagnostic  Dominance: Co-dominant    Intervention       Assessment:   58 y/o smoker with DM2 admitted with [place]. Found to have 90% ostial RCA lesion which was successfully stented. Unfortunately case c/b Type A aortic dissection requiring emergent surgical repair on 02/23/19  Plan/Discussion:    1. [place] with CAD s/p stenting of high-grade ostial RCA on 02/23/19 followed by CABG with SVG -> RCA - continue ASA, statin,m b-blocker - will likely need DAPT reinstituted however with new-onset AF if he requires [facility] may be better with Apixaban/Plavix (no ASA) - shoulder pain not ischemic in nature likely adhesive capsulitis. PT to see - Intra-op TEE EF 60-65% - Continue to diureses. Wean milrinone  2. Type I aortic dissection - s/p repair 2/26  3. Post-op AF/AFL - agree with IV amio. I tried to rapid atrial pace him and he went  in to rapid VT for 1-2 mins and broke spontaneously - Discussed AC strategy  with Dr. Servando Snare. Prefers to wait at least one more day as CTs coming out today.  - If AF resolves quickly can likely do ASA/Plavix if AF persists consider Apixaban/Plavix  4. COPD - needs smoking cessation   5. DM2 - Resume home losartan as able - consider Jardiance and   6. Hypokalemia - will supp  7. Shoulder pain - likely musculoskeletal (adhesive capsulitis). Doubt ischemic. Trops coming down.  - PT to see  CRITICAL CARE Performed by: Glori Bickers  Total critical care time: 35 minutes  Critical care time was exclusive of separately billable procedures and treating other patients.  Critical care was necessary to treat or prevent imminent or life-threatening deterioration.  Critical care was time spent personally by me (independent of midlevel providers or residents) on the following activities: development of treatment plan with patient and/or surrogate as  well as nursing, discussions with consultants, evaluation of patient's response to treatment, examination of patient, obtaining history from patient or surrogate, ordering and performing treatments and interventions, ordering and review of laboratory studies, ordering and review of radiographic studies, pulse oximetry and re-evaluation of patient's condition.    Length of Stay: 4   Glori Bickers MD 02/27/2019, 8:43 AM  Advanced Heart Failure Team Pager [phone] (M-F; 7a - 4p)  Please contact [facility] for night-coverage after hours (4p -7a ) and weekends on [email or web address]

## 2019-02-27 NOTE — Progress Notes (Addendum)
Patient ID: [name] [name], male   DOB: 01/22/1961, 58 y.o.   MRN: [id number] [facility] DAILY ICU PROGRESS NOTE                   [address]            [place] [address]          [phone]   4 Days Post-Op Procedure(s) (LRB): REPAIR OF TYPE A  - ACUTE ASCENDING THORACIC AORTIC DISSECTION, using Hemashield Platinum Woven Double Velour Vascular Graft (D: 57m, L: 30cm) (N/A) CORONARY ARTERY BYPASS GRAFTING (CABG) x 1; Using Endoscopically Harvested Right Leg Greater Saphenous Vein Graft (SVG); SVG to RCA (N/A) TRANSESOPHAGEAL ECHOCARDIOGRAM (TEE) (N/A)  Total Length of Stay:  LOS: 4 days   Subjective: This morning patient up in chair developed rapid atrial fib with a rate of 170, started on IV amnio drip. Complaining of left shoulder pain when moves arm.   Objective: Vital signs in last 24 hours: Temp:  [98.9 F (37.2 C)-100 F (37.8 C)] 98.9 F (37.2 C) (03/01 0300) Pulse Rate:  [91-170] 170 (03/01 0800) Cardiac Rhythm: Normal sinus rhythm;Sinus tachycardia (03/01 0400) Resp:  [11-22] 16 (03/01 0800) BP: (127-180)/(57-94) 141/86 (03/01 0800) SpO2:  [94 %-99 %] 97 % (03/01 0800) Arterial Line BP: (140-185)/(55-75) 179/66 (02/29 1600) FiO2 (%):  [40 %] 40 % (02/29 0847)  Filed Weights   02/22/19 1655 02/25/19 0500 02/26/19 0102  Weight: 110.2 kg 120.1 kg 117.2 kg    Weight change:    Hemodynamic parameters for last 24 hours: PAP: (27-38)/(15-27) 28/18 CVP:  [0 mmHg-22 mmHg] 0 mmHg CO:  [6.9 L/min-8.8 L/min] 8.8 L/min CI:  [3.1 L/min/m2-4 L/min/m2] 4 L/min/m2  Intake/Output from previous day: 02/29 0701 - 03/01 0700 In: 2899.5 [P.O.:360; I.V.:1251.9; IV Piggyback:1287.7] Out: 6438 [Urine:6160; Emesis/NG output:100; Chest Tube:178]  Intake/Output this shift: Total I/O In: -  Out: 125 [Urine:125]  Current Meds: Scheduled Meds: . sodium chloride   Intravenous Once  . acetaminophen  1,000 mg Oral Q6H   Or  . acetaminophen (TYLENOL) oral liquid  160 mg/5 mL  1,000 mg Per Tube Q6H  . allopurinol  100 mg Oral q morning - 10a  . amiodarone  400 mg Oral Q12H  . aspirin EC  325 mg Oral Daily   Or  . aspirin  324 mg Per Tube Daily  . atorvastatin  40 mg Oral q1800  . bisacodyl  10 mg Oral Daily   Or  . bisacodyl  10 mg Rectal Daily  . budesonide (PULMICORT) nebulizer solution  0.5 mg Nebulization BID  . Chlorhexidine Gluconate Cloth  6 each Topical Daily  . docusate sodium  200 mg Oral Daily  . insulin aspart  0-24 Units Subcutaneous Q4H  . insulin detemir  14 Units Subcutaneous BID  . levalbuterol  0.63 mg Nebulization TID  . mouth rinse  15 mL Mouth Rinse BID  . metoprolol tartrate  25 mg Oral BID  . multivitamin with minerals  1 tablet Oral Daily  . mupirocin ointment   Nasal BID  . pantoprazole  40 mg Oral Daily  . sodium chloride flush  10-40 mL Intracatheter Q12H  . sodium chloride flush  3 mL Intravenous Q12H  . sodium chloride flush  3 mL Intravenous Q12H   Continuous Infusions: . sodium chloride    . sodium chloride    . sodium chloride 20 mL/hr at 02/24/19 0500  . amiodarone 60 mg/hr (02/27/19 0833)  Followed by  . amiodarone    . amiodarone    . dexmedetomidine (PRECEDEX) IV infusion Stopped (02/26/19 0842)  . furosemide (LASIX) infusion 5 mg/hr (02/27/19 0700)  . lactated ringers 20 mL/hr at 02/24/19 1400  . lactated ringers 20 mL/hr at 02/27/19 0700  . milrinone 0.25 mcg/kg/min (02/27/19 0700)  . norepinephrine (LEVOPHED) Adult infusion Stopped (02/25/19 1603)  . potassium chloride 10 mEq (02/27/19 0756)  . potassium chloride    . vancomycin Stopped (02/26/19 2140)   PRN Meds:.sodium chloride, acetaminophen, ipratropium-albuterol, morphine injection, ondansetron (ZOFRAN) IV, oxyCODONE, potassium chloride, sodium chloride flush, sodium chloride flush, sodium chloride flush, traMADol  General appearance: alert, cooperative and fatigued Neurologic: intact Heart: heart rate 160 , svt Lungs: diminished  breath sounds bibasilar Abdomen: soft, non-tender; bowel sounds normal; no masses,  no organomegaly Extremities: extremities normal, atraumatic, no cyanosis or edema and Homans sign is negative, no sign of DVT Wound: sternum stable, dressing s inatct, right arm neurovascular inatct   Lab Results: CBC: Recent Labs    02/26/19 0206  02/26/19 1607 02/27/19 0501  WBC 10.7*  --   --  14.1*  HGB 9.1*   < > 9.2* 9.3*  HCT 27.7*   < > 27.0* 28.8*  PLT 67*  --   --  120*   < > = values in this interval not displayed.   BMET:  Recent Labs    02/26/19 0206  02/26/19 1607 02/27/19 0501  NA 141   < > 137 137  K 3.5   < > 3.4* 3.1*  CL 107  --   --  93*  CO2 27  --   --  31  GLUCOSE 172*  --   --  117*  BUN 24*  --   --  20  CREATININE 1.06  --   --  0.89  CALCIUM 8.6*  --   --  8.7*   < > = values in this interval not displayed.    CMET: Lab Results  Component Value Date   WBC 14.1 (H) 02/27/2019   HGB 9.3 (L) 02/27/2019   HCT 28.8 (L) 02/27/2019   PLT 120 (L) 02/27/2019   GLUCOSE 117 (H) 02/27/2019   CHOL 156 02/23/2019   TRIG 101 02/23/2019   HDL 31 (L) 02/23/2019   LDLCALC 105 (H) 02/23/2019   ALT 46 (H) 02/27/2019   AST 67 (H) 02/27/2019   NA 137 02/27/2019   K 3.1 (L) 02/27/2019   CL 93 (L) 02/27/2019   CREATININE 0.89 02/27/2019   BUN 20 02/27/2019   CO2 31 02/27/2019   INR 1.1 02/24/2019   HGBA1C 6.0 (H) 02/23/2019   Lab Results  Component Value Date   CKTOTAL 117 05/26/2012   CKMB 2.9 05/26/2012   TROPONINI 1.59 (HH) 02/27/2019      PT/INR: No results for input(s): LABPROT, INR in the last 72 hours. Radiology: Dg Chest Port 1 View  Result Date: 02/27/2019 CLINICAL DATA:  Follow-up chest tube EXAM: PORTABLE CHEST 1 VIEW COMPARISON:  02/26/2011 FINDINGS: Right jugular sheath remains in place although the Swan-Ganz catheter has been removed. The endotracheal tube and gastric catheter have been removed as well. Right-sided PICC line and right thoracostomy  catheter are noted in satisfactory position. No pneumothorax is seen. Mild right basilar atelectasis is noted. The left lung remains clear. IMPRESSION: No pneumothorax on the right. Mild right basilar atelectasis is noted. Electronically Signed   By: Inez Catalina M.D.   On: 02/27/2019 08:18  COX 61   Assessment/Plan: S/P Procedure(s) (LRB): REPAIR OF TYPE A  - ACUTE ASCENDING THORACIC AORTIC DISSECTION, using Hemashield Platinum Woven Double Velour Vascular Graft (D: 28m, L: 30cm) (N/A) CORONARY ARTERY BYPASS GRAFTING (CABG) x 1; Using Endoscopically Harvested Right Leg Greater Saphenous Vein Graft (SVG); SVG to RCA (N/A) TRANSESOPHAGEAL ECHOCARDIOGRAM (TEE) (N/A) Mobilize Diuresis Diabetes control d/c tubes/lines Mild elevation of troponin- before svt this am Started on IV Cordarone Tolerating extubation yesterday Replace kcl On lasix drip and milrinone - renal function stable  Wean off milrinone  Platelets have increased to 120- 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

## 2019-02-28 ENCOUNTER — Inpatient Hospital Stay (HOSPITAL_COMMUNITY): Payer: Medicare Other

## 2019-02-28 DIAGNOSIS — I2510 Atherosclerotic heart disease of native coronary artery without angina pectoris: Secondary | ICD-10-CM

## 2019-02-28 DIAGNOSIS — Z9889 Other specified postprocedural states: Secondary | ICD-10-CM

## 2019-02-28 DIAGNOSIS — Z8679 Personal history of other diseases of the circulatory system: Secondary | ICD-10-CM

## 2019-02-28 DIAGNOSIS — Z9861 Coronary angioplasty status: Secondary | ICD-10-CM

## 2019-02-28 LAB — COMPREHENSIVE METABOLIC PANEL
ALT: 59 U/L — ABNORMAL HIGH (ref 0–44)
AST: 60 U/L — ABNORMAL HIGH (ref 15–41)
Albumin: 2.4 g/dL — ABNORMAL LOW (ref 3.5–5.0)
Alkaline Phosphatase: 122 U/L (ref 38–126)
Anion gap: 10 (ref 5–15)
BUN: 19 mg/dL (ref 6–20)
CO2: 34 mmol/L — ABNORMAL HIGH (ref 22–32)
Calcium: 8.4 mg/dL — ABNORMAL LOW (ref 8.9–10.3)
Chloride: 89 mmol/L — ABNORMAL LOW (ref 98–111)
Creatinine, Ser: 0.74 mg/dL (ref 0.61–1.24)
GFR calc Af Amer: >60 mL/min (ref >60)
GFR calc non Af Amer: >60 mL/min (ref >60)
Glucose, Bld: 129 mg/dL — ABNORMAL HIGH (ref 70–99)
Potassium: 3.1 mmol/L — ABNORMAL LOW (ref 3.5–5.1)
Sodium: 133 mmol/L — ABNORMAL LOW (ref 135–145)
Total Bilirubin: 0.8 mg/dL (ref 0.3–1.2)
Total Protein: 5.5 g/dL — ABNORMAL LOW (ref 6.5–8.1)

## 2019-02-28 LAB — CBC
HCT: 26.3 % — ABNORMAL LOW (ref 39.0–52.0)
Hemoglobin: 8.4 g/dL — ABNORMAL LOW (ref 13.0–17.0)
MCH: 28.7 pg (ref 26.0–34.0)
MCHC: 31.9 g/dL (ref 30.0–36.0)
MCV: 89.8 fL (ref 80.0–100.0)
Platelets: 124 10*3/uL — ABNORMAL LOW (ref 150–400)
RBC: 2.93 MIL/uL — ABNORMAL LOW (ref 4.22–5.81)
RDW: 13.8 % (ref 11.5–15.5)
WBC: 11.2 10*3/uL — ABNORMAL HIGH (ref 4.0–10.5)
nRBC: 0.7 % — ABNORMAL HIGH (ref 0.0–0.2)

## 2019-02-28 LAB — BASIC METABOLIC PANEL
Anion gap: 13 (ref 5–15)
BUN: 29 mg/dL — ABNORMAL HIGH (ref 6–20)
CO2: 31 mmol/L (ref 22–32)
Calcium: 8 mg/dL — ABNORMAL LOW (ref 8.9–10.3)
Chloride: 89 mmol/L — ABNORMAL LOW (ref 98–111)
Creatinine, Ser: 1.42 mg/dL — ABNORMAL HIGH (ref 0.61–1.24)
GFR calc Af Amer: >60 mL/min (ref >60)
GFR calc non Af Amer: 54 mL/min — ABNORMAL LOW (ref >60)
Glucose, Bld: 123 mg/dL — ABNORMAL HIGH (ref 70–99)
Potassium: 3.1 mmol/L — ABNORMAL LOW (ref 3.5–5.1)
Sodium: 133 mmol/L — ABNORMAL LOW (ref 135–145)

## 2019-02-28 LAB — COOXEMETRY PANEL
Carboxyhemoglobin: 1.7 % — ABNORMAL HIGH (ref 0.5–1.5)
Methemoglobin: 1 % (ref 0.0–1.5)
O2 Saturation: 54.3 %
Total hemoglobin: 9.7 g/dL — ABNORMAL LOW (ref 12.0–16.0)

## 2019-02-28 LAB — GLUCOSE, CAPILLARY
Glucose-Capillary: 126 mg/dL — ABNORMAL HIGH (ref 70–99)
Glucose-Capillary: 122 mg/dL — ABNORMAL HIGH (ref 70–99)
Glucose-Capillary: 109 mg/dL — ABNORMAL HIGH (ref 70–99)
Glucose-Capillary: 106 mg/dL — ABNORMAL HIGH (ref 70–99)
Glucose-Capillary: 109 mg/dL — ABNORMAL HIGH (ref 70–99)

## 2019-02-28 MED ORDER — CARVEDILOL 3.125 MG PO TABS
3.1250 mg | ORAL_TABLET | Freq: Two times a day (BID) | ORAL | Status: DC
  Administered 2019-02-28 – 2019-03-01 (×3): 3.125 mg via ORAL
  Filled 2019-02-28 (×3): qty 1

## 2019-02-28 MED ORDER — MILRINONE LACTATE IN DEXTROSE 20-5 MG/100ML-% IV SOLN
0.1250 ug/kg/min | INTRAVENOUS | Status: DC
  Administered 2019-02-28 – 2019-03-01 (×3): 0.25 ug/kg/min via INTRAVENOUS
  Filled 2019-02-28 (×2): qty 100

## 2019-02-28 MED ORDER — LOSARTAN POTASSIUM 25 MG PO TABS: 25.0000 mg | ORAL_TABLET | Freq: Every day | ORAL | Status: DC

## 2019-02-28 MED ORDER — DOXYCYCLINE HYCLATE 100 MG PO TABS
100.0000 mg | ORAL_TABLET | Freq: Two times a day (BID) | ORAL | Status: AC
  Administered 2019-03-01 – 2019-03-04 (×8): 100 mg via ORAL
  Filled 2019-02-28 (×8): qty 1

## 2019-02-28 MED ORDER — METOPROLOL TARTRATE 5 MG/5ML IV SOLN
5.0000 mg | Freq: Once | INTRAVENOUS | Status: AC
  Administered 2019-02-28: 5 mg via INTRAVENOUS
  Filled 2019-02-28: qty 5

## 2019-02-28 MED ORDER — ENOXAPARIN SODIUM 40 MG/0.4ML ~~LOC~~ SOLN
40.0000 mg | SUBCUTANEOUS | Status: DC
  Administered 2019-02-28 – 2019-03-05 (×6): 40 mg via SUBCUTANEOUS
  Filled 2019-02-28 (×6): qty 0.4

## 2019-02-28 MED ORDER — TRAZODONE HCL 100 MG PO TABS
100.0000 mg | ORAL_TABLET | Freq: Every day | ORAL | Status: DC
  Administered 2019-02-28 – 2019-03-04 (×5): 100 mg via ORAL
  Filled 2019-02-28: qty 2
  Filled 2019-02-28: qty 1
  Filled 2019-02-28: qty 2
  Filled 2019-02-28 (×2): qty 1

## 2019-02-28 MED ORDER — POTASSIUM CHLORIDE CRYS ER 20 MEQ PO TBCR
20.0000 meq | EXTENDED_RELEASE_TABLET | Freq: Two times a day (BID) | ORAL | Status: DC
  Administered 2019-02-28: 20 meq via ORAL
  Filled 2019-02-28: qty 1

## 2019-02-28 MED ORDER — AMIODARONE HCL IN DEXTROSE 360-4.14 MG/200ML-% IV SOLN
30.0000 mg/h | INTRAVENOUS | Status: DC
  Administered 2019-02-28 (×2): via INTRAVENOUS
  Administered 2019-03-01: 30 mg/h via INTRAVENOUS
  Filled 2019-02-28 (×3): qty 200

## 2019-02-28 MED ORDER — ENSURE ENLIVE PO LIQD
237.0000 mL | Freq: Two times a day (BID) | ORAL | Status: DC
  Administered 2019-03-01 – 2019-03-05 (×3): 237 mL via ORAL

## 2019-02-28 MED ORDER — INSULIN ASPART 100 UNIT/ML ~~LOC~~ SOLN
0.0000 [IU] | Freq: Three times a day (TID) | SUBCUTANEOUS | Status: DC
  Administered 2019-02-28 – 2019-03-01 (×2): 2 [IU] via SUBCUTANEOUS

## 2019-02-28 MED ORDER — FUROSEMIDE 10 MG/ML IJ SOLN
40.0000 mg | Freq: Two times a day (BID) | INTRAMUSCULAR | Status: DC
  Administered 2019-02-28 (×2): 40 mg via INTRAVENOUS
  Filled 2019-02-28 (×2): qty 4

## 2019-02-28 MED ORDER — AMIODARONE HCL IN DEXTROSE 360-4.14 MG/200ML-% IV SOLN
INTRAVENOUS | Status: AC
  Filled 2019-02-28: qty 200

## 2019-02-28 MED ORDER — POTASSIUM CHLORIDE 10 MEQ/50ML IV SOLN
10.0000 meq | INTRAVENOUS | Status: AC
  Administered 2019-02-28 (×4): 10 meq via INTRAVENOUS
  Filled 2019-02-28 (×5): qty 50

## 2019-02-28 MED ORDER — POTASSIUM CHLORIDE 10 MEQ/50ML IV SOLN
10.0000 meq | INTRAVENOUS | Status: AC
  Administered 2019-02-28 (×2): 10 meq via INTRAVENOUS
  Filled 2019-02-28: qty 50

## 2019-02-28 MED ORDER — INSULIN ASPART 100 UNIT/ML ~~LOC~~ SOLN: 0.0000 [IU] | Freq: Every day | SUBCUTANEOUS | Status: DC

## 2019-02-28 MED ORDER — POTASSIUM CHLORIDE 10 MEQ/50ML IV SOLN
10.0000 meq | Freq: Once | INTRAVENOUS | Status: AC
  Administered 2019-02-28: 10 meq via INTRAVENOUS

## 2019-02-28 MED ORDER — AMIODARONE IV BOLUS ONLY 150 MG/100ML
150.0000 mg | Freq: Once | INTRAVENOUS | Status: AC
  Administered 2019-02-28: 150 mg via INTRAVENOUS

## 2019-02-28 MED ORDER — LOSARTAN POTASSIUM 50 MG PO TABS
50.0000 mg | ORAL_TABLET | Freq: Every day | ORAL | Status: DC
  Administered 2019-02-28 – 2019-03-01 (×2): 50 mg via ORAL
  Filled 2019-02-28 (×2): qty 1

## 2019-02-28 MED ORDER — KETOROLAC TROMETHAMINE 15 MG/ML IJ SOLN
15.0000 mg | Freq: Once | INTRAMUSCULAR | Status: AC
  Administered 2019-02-28: 15 mg via INTRAVENOUS
  Filled 2019-02-28: qty 1

## 2019-02-28 MED ORDER — AMIODARONE IV BOLUS ONLY 150 MG/100ML: 150.0000 mg | Freq: Once | INTRAVENOUS | Status: AC

## 2019-02-28 MED ORDER — ADULT MULTIVITAMIN W/MINERALS CH
1.0000 | ORAL_TABLET | Freq: Every day | ORAL | Status: DC
  Administered 2019-03-01 – 2019-03-04 (×3): 1 via ORAL
  Filled 2019-02-28 (×3): qty 1

## 2019-02-28 NOTE — Progress Notes (Signed)
Pt in a-fib w/ HR sustaining in 140s. MD notified. RN instructed to give 150 mg bolus of amio and give next PO dose early. Will continue to monitor pt closely.

## 2019-02-28 NOTE — Progress Notes (Signed)
5 Days Post-Op Procedure(s) (LRB): REPAIR OF TYPE A  - ACUTE ASCENDING THORACIC AORTIC DISSECTION, using Hemashield Platinum Woven Double Velour Vascular Graft (D: 58m, L: 30cm) (N/A) CORONARY ARTERY BYPASS GRAFTING (CABG) x 1; Using Endoscopically Harvested Right Leg Greater Saphenous Vein Graft (SVG); SVG to RCA (N/A) TRANSESOPHAGEAL ECHOCARDIOGRAM (TEE) (N/A) Subjective: Up in chair on room air nsr on iv amio Co-ox down 54% on .125 mil, BP high Add losartan, up mil to .25 Transition from ladsix drip to iv bid 462mObjective: Vital signs in last 24 hours: Temp:  [98.1 F (36.7 C)-99.1 F (37.3 C)] 98.9 F (37.2 C) (03/02 0727) Pulse Rate:  [77-170] 80 (03/02 0400) Cardiac Rhythm: Normal sinus rhythm (03/02 0438) Resp:  [11-28] 22 (03/02 0400) BP: (111-166)/(56-143) 150/56 (03/02 0400) SpO2:  [95 %-100 %] 96 % (03/02 0400) Weight:  [111.5 kg] 111.5 kg (03/02 0500)  Hemodynamic parameters for last 24 hours: CVP:  [0 mmHg-21 mmHg] 19 mmHg  Intake/Output from previous day: 03/01 0701 - 03/02 0700 In: 3529.4 [P.O.:1230; I.V.:1376.1; IV Piggyback:923.3] Out: 2910 [Urine:2800; Chest Tube:110] Intake/Output this shift: No intake/output data recorded.       Exam    General- alert and comfortable    Neck- no JVD, no cervical adenopathy palpable, no carotid bruit   Lungs- clear without rales, wheezes. Sternal incision clean, dry   Cor- regular rate and rhythm, no murmur , gallop   Abdomen- soft, non-tender   Extremities - warm, non-tender, minimal edema, weak R hand grip   Neuro- oriented, appropriate, no focal weakness   Lab Results: Recent Labs    02/27/19 0501 02/28/19 0344  WBC 14.1* 11.2*  HGB 9.3* 8.4*  HCT 28.8* 26.3*  PLT 120* 124*   BMET:  Recent Labs    02/27/19 0501 02/28/19 0344  NA 137 133*  K 3.1* 3.1*  CL 93* 89*  CO2 31 34*  GLUCOSE 117* 129*  BUN 20 19  CREATININE 0.89 0.74  CALCIUM 8.7* 8.4*    PT/INR: No results for input(s): LABPROT,  INR in the last 72 hours. ABG    Component Value Date/Time   PHART 7.541 (H) 02/26/2019 1607   HCO3 31.5 (H) 02/26/2019 1607   TCO2 33 (H) 02/26/2019 1607   ACIDBASEDEF 3.0 (H) 02/24/2019 0155   O2SAT 54.3 02/28/2019 0609   CBG (last 3)  Recent Labs    02/27/19 2334 02/28/19 0602 02/28/19 0729  GLUCAP 124* 126* 122*    Assessment/Plan: S/P Procedure(s) (LRB): REPAIR OF TYPE A  - ACUTE ASCENDING THORACIC AORTIC DISSECTION, using Hemashield Platinum Woven Double Velour Vascular Graft (D: 2682mL: 30cm) (N/A) CORONARY ARTERY BYPASS GRAFTING (CABG) x 1; Using Endoscopically Harvested Right Leg Greater Saphenous Vein Graft (SVG); SVG to RCA (N/A) TRANSESOPHAGEAL ECHOCARDIOGRAM (TEE) (N/A) Mobilize, diuresis Hold eliquis for now, lovenox 40 started Keep in ICU  LOS: 5 days    [name] [name] 02/28/2019

## 2019-02-28 NOTE — Evaluation (Signed)
Physical Therapy Evaluation Patient Details Name: [name] [name] MRN: [id number] DOB: May 12, 1961 Today's Date: 02/28/2019   History of Present Illness  Pt adm with chest pain from [facility]. Pt underwent cardiac cath with stent placement. 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 Underwent emergent repair of aortic dissection and CABG x 1 on 2/26. PMH - CAD, gout, copd, CVA, back surgery  Clinical Impression  Pt admitted with above diagnosis and presents to PT with functional limitations due to deficits listed below (See PT problem list). Pt needs skilled PT to maximize independence and safety to allow discharge to home with wife. Expect pt will make good progress and likely not need PT at DC.      Follow Up Recommendations Supervision - Intermittent;No PT follow up    Equipment Recommendations  Other (comment)(rollator)    Recommendations for Other Services OT consult     Precautions / Restrictions Precautions Precautions: Sternal;Fall      Mobility  Bed Mobility               General bed mobility comments: Pt up in chair  Transfers Overall transfer level: Needs assistance Equipment used: 4-wheeled walker Transfers: Sit to/from Stand;Stand Pivot Transfers Sit to Stand: Min guard Stand pivot transfers: Min assist       General transfer comment: Assist for safety and lines. Verbal cues for sternal precautions. Chair to bsc with min assist for balance  Ambulation/Gait Ambulation/Gait assistance: Min assist Gait Distance (Feet): 140 Feet Assistive device: 4-wheeled walker Gait Pattern/deviations: Step-through pattern;Decreased stride length;Trunk flexed;Wide base of support Gait velocity: decr Gait velocity interpretation: <1.31 ft/sec, indicative of household ambulator General Gait Details: Assist for balance and support. Verbal cues to stand more erect and stay closer to walker. Amb on 4L of O2 with SpO2 > 94%  Stairs            Wheelchair  Mobility    Modified Rankin (Stroke Patients Only)       Balance Overall balance assessment: Needs assistance Sitting-balance support: No upper extremity supported;Feet supported Sitting balance-Leahy Scale: Good     Standing balance support: Single extremity supported Standing balance-Leahy Scale: Poor Standing balance comment: UE support and min guard for static standing                             Pertinent Vitals/Pain Pain Assessment: No/denies pain    Home Living Family/patient expects to be discharged to:: Private residence Living Arrangements: Spouse/significant other Available Help at Discharge: Family Type of Home: House Home Access: Stairs to enter   Technical brewer of Steps: 1 Home Layout: One level Home Equipment: Cane - single point      Prior Function Level of Independence: Independent               Hand Dominance   Dominant Hand: Right    Extremity/Trunk Assessment   Upper Extremity Assessment Upper Extremity Assessment: Defer to OT evaluation    Lower Extremity Assessment Lower Extremity Assessment: Generalized weakness       Communication   Communication: No difficulties  Cognition Arousal/Alertness: Awake/alert Behavior During Therapy: WFL for tasks assessed/performed Overall Cognitive Status: Within Functional Limits for tasks assessed                                        General Comments  Exercises     Assessment/Plan    PT Assessment Patient needs continued PT services  PT Problem List Decreased strength;Decreased activity tolerance;Decreased balance;Decreased mobility;Decreased knowledge of use of DME;Decreased knowledge of precautions       PT Treatment Interventions DME instruction;Gait training;Functional mobility training;Therapeutic activities;Therapeutic exercise;Balance training;Patient/family education    PT Goals (Current goals can be found in the Care Plan section)   Acute Rehab PT Goals Patient Stated Goal: return home PT Goal Formulation: With patient Time For Goal Achievement: 03/14/19 Potential to Achieve Goals: Good    Frequency Min 3X/week   Barriers to discharge        Co-evaluation               AM-PAC PT "6 Clicks" Mobility  Outcome Measure                  End of Session Equipment Utilized During Treatment: Gait belt;Oxygen Activity Tolerance: Patient tolerated treatment well Patient left: with call bell/phone within reach;Other (comment)(on bsc) Nurse Communication: Mobility status;Other (comment)(Pt on bsc) PT Visit Diagnosis: Unsteadiness on feet (R26.81);Other abnormalities of gait and mobility (R26.89);Muscle weakness (generalized) (M62.81)    Time: 2111-7356 PT Time Calculation (min) (ACUTE ONLY): 31 min   Charges:   PT Evaluation $PT Eval Moderate Complexity: 1 Mod PT Treatments $Gait Training: 8-22 mins        [place] Pager [phone] Office [place] 02/28/2019, 1:26 PM

## 2019-02-28 NOTE — Progress Notes (Signed)
Pharmacy Antibiotic Note  [name] [name] is a 58 y.o. male admitted on 02/22/2019 with pneumonia.  Pharmacy has been consulted for vancomycin dosing. Tracheal aspirate showing few staph aureus with sensitivities returned as pan-sensitive. Pt has PCN allergy but could consider transitioning to doxycycline PO. Cr improving post-op.  Plan: -Continue vancomycin 1535m IV q12h -Consider changing to doxycycline tomorrow - otherwise will order vancomycin levels  Height: 5' 8"  (172.7 cm) Weight: 245 lb 12.8 oz (111.5 kg) IBW/kg (Calculated) : 68.4  Temp (24hrs), Avg:98.7 F (37.1 C), Min:98.1 F (36.7 C), Max:99.1 F (37.3 C)  Recent Labs  Lab 02/24/19 1022 02/25/19 0421 02/25/19 0500 02/25/19 1522 02/26/19 0206 02/27/19 0501 02/28/19 0344  WBC 12.0* 12.4*  --   --  10.7* 14.1* 11.2*  CREATININE 1.03  --  1.03 1.03 1.06 0.89 0.74    Estimated Creatinine Clearance: 121.9 mL/min (by C-G formula based on SCr of 0.74 mg/dL).    Allergies  Allergen Reactions  . Penicillins Hives and Rash    Did it involve swelling of the face/tongue/throat, SOB, or low BP? Yes Did it involve sudden or severe rash/hives, skin peeling, or any reaction on the inside of your mouth or nose? Yes  Did you need to seek medical attention at a hospital or doctor's office? Yes When did it last happen?As a child. If all above answers are "NO", may proceed with cephalosporin use.     Antimicrobials this admission: Vancomycin 2/26>2/27 (surgical ppx), 2/28 >>  Levaquin 2/26 >> 2/28 (surgical ppx)  Dose adjustments this admission: N/A  Microbiology results: 2/27 Sputum: few MSSA 2/27 MRSA PCR: staph aureus +, MRSA -  Thank you for allowing pharmacy to be a part of this patient's care.  MArrie Senate PharmD, BCPS Clinical Pharmacist [phone] check AMION for all [place]bers 02/28/2019

## 2019-02-28 NOTE — Progress Notes (Signed)
[facility] BRIEF SICU PROGRESS NOTE  5 Days Post-Op  S/P Procedure(s) (LRB): REPAIR OF TYPE A  - ACUTE ASCENDING THORACIC AORTIC DISSECTION, using Hemashield Platinum Woven Double Velour Vascular Graft (D: 23m, L: 30cm) (N/A) CORONARY ARTERY BYPASS GRAFTING (CABG) x 1; Using Endoscopically Harvested Right Leg Greater Saphenous Vein Graft (SVG); SVG to RCA (N/A) TRANSESOPHAGEAL ECHOCARDIOGRAM (TEE) (N/A)   Still in rapid AF w/ HR 1517 BP 1616systolic Breathing comfortably w/ O2 sats 96% on 2 L/min Potassium remains low 3.1 Creatinine up 1.4  Plan: Extra IV bolus amiodarone.  Hold lasix.  Supplement potassium  CRexene Alberts MD 02/28/2019 6:31 PM

## 2019-02-28 NOTE — Progress Notes (Signed)
Pt remains in rapid a-fib in 130s-140s despite 150 mg amio bolus. MD made aware. RN instructed to resume continuous amio gtt at 72m.

## 2019-02-28 NOTE — Progress Notes (Signed)
Pharmacy Heparin Induced Thrombocytopenia (HIT) Note:  [name] [name] is an 58 y.o. male being evaluated for HIT. Heparin was started 2/25 for DVT prophylaxis, and baseline platelets were 240.   HIT labs were ordered on 2/29 when platelets dropped to 67. HIT Ab negative on 02/27/19.  Auto-populate labs:  Heparin Induced Plt Ab  Date/Time Value Ref Range Status  02/26/2019 10:00 AM 0.156 0.000 - 0.400 OD Final    Comment:    (NOTE) Performed At: [facility] [place], [place] [id number] Rush Farmer MD ER:[id number]       CALCULATE SCORE:  4Ts (see the HIT Algorithm) Score  Thrombocytopenia 1  Timing 1  Thrombosis 0  Other causes of thrombocytopenia 0  Total 2     Recommendations (A or B or C) are based on available lab results:   B. HIT antibody result available: -Ruled Out HIT: No SRA needed, continue heparin product, no allergy documentation needed  C. SRA result available -SRA not available  Name of MD Contacted: none  Plan (Discussed with provider) Labs ordered: -Heparin antibody >> negative Heparin allergy: -documented or updated Anticoagulation plans - resume LMWH for DVT prophylaxis   Arrie Senate, PharmD, BCPS Clinical Pharmacist [phone] Please check AMION for all [place] numbers 02/28/2019

## 2019-02-28 NOTE — Addendum Note (Signed)
Addendum  created 02/28/19 0813 by Josephine Igo, CRNA   Intraprocedure Meds edited

## 2019-02-28 NOTE — Progress Notes (Signed)
[facility] Rounding Note   Subjective:    POD #5 Repair of Type A aortic dissection and CABG with SVG to RCA  Yesterday, shoulder pain evaluated by PT => shoulder is still sore with the decreased range of motion.  Also notes bilateral hand and finger numbness with reduced grip.  Converted from IV amiodarone to oral amiodarone -> no longer in A. fib.  No longer having runs of nonsustained VT/.  A. fib (obesity) Milrinone dose reduced to 0.125 -but increased back to 0.25 today because coags 56. Converted from Lasix drip to 40 mg IV twice daily.  Losartan added today.  Objective:   Weight Range:  Vital Signs:   Temp:  [98.1 F (36.7 C)-99.1 F (37.3 C)] 98.9 F (37.2 C) (03/02 0727) Pulse Rate:  [77-169] 89 (03/02 0802) Resp:  [11-28] 19 (03/02 0802) BP: (111-166)/(56-143) 152/58 (03/02 0802) SpO2:  [95 %-100 %] 98 % (03/02 0802) Weight:  [111.5 kg] 111.5 kg (03/02 0500) Last BM Date: 02/26/19  Weight change: Filed Weights   02/25/19 0500 02/26/19 0102 02/28/19 0500  Weight: 120.1 kg 117.2 kg 111.5 kg    Intake/Output:   Intake/Output Summary (Last 24 hours) at 02/28/2019 9163 Last data filed at 02/28/2019 0600 Gross per 24 hour  Intake 3492.49 ml  Output 2675 ml  Net 817.49 ml     Physical Exam  Constitutional: He is oriented to person, place, and time. He appears well-developed and well-nourished. No distress.  HENT:  Head: Normocephalic and atraumatic.  Neck: Normal range of motion. Neck supple. No JVD present.  Cardiovascular: Normal rate and regular rhythm.  No extrasystoles are present. PMI is not displaced. Exam reveals distant heart sounds, friction rub and decreased pulses (Mildly decreased pedal pulses bilaterally). Exam reveals no gallop.  No murmur heard. Pulmonary/Chest: Effort normal and breath sounds normal. No respiratory distress. He has no wheezes. He exhibits tenderness (Postop).  Sternal wound well-healed; mild coarse breath sounds, but no  rales or rhonchi.  Abdominal: Soft. Bowel sounds are normal. He exhibits no distension. There is no abdominal tenderness. There is no rebound.  Musculoskeletal: Normal range of motion.        General: Edema (1-2+ bilateral lower extremity and upper extremity) present.     Comments: Decreased range of motion of left shoulder.  Decreased handgrip right worse than left.  Neurological: He is alert and oriented to person, place, and time.  Psychiatric: He has a normal mood and affect. His behavior is normal. Judgment normal.  Vitals reviewed.  Telemetry: Now in sinus rhythm, rates in the 80s.  No new EKG  Labs: Basic Metabolic Panel: Recent Labs  Lab 02/24/19 0500 02/24/19 1022  02/25/19 0421  02/25/19 0500 02/25/19 1522  02/26/19 0206 02/26/19 0903 02/26/19 1046 02/26/19 1607 02/27/19 0501 02/28/19 0344  NA 142 144   < >  --    < > 143 141   < > 141 140 140 137 137 133*  K 4.1 3.8   < >  --    < > 3.2* 3.6   < > 3.5 3.2* 3.4* 3.4* 3.1* 3.1*  CL 110 113*  --   --   --  111 110  --  107  --   --   --  93* 89*  CO2 24 24  --   --   --  23 24  --  27  --   --   --  31 34*  GLUCOSE 117*  109*  --   --   --  136* 202*  --  172*  --   --   --  117* 129*  BUN 14 18  --   --   --  21* 22*  --  24*  --   --   --  20 19  CREATININE 0.91 1.03  --   --   --  1.03 1.03  --  1.06  --   --   --  0.89 0.74  CALCIUM 7.1* 7.4*  --   --   --  8.1* 8.3*  --  8.6*  --   --   --  8.7* 8.4*  MG 1.7 2.3  --  2.2  --   --   --   --   --   --   --   --  1.9  --    < > = values in this interval not displayed.    Liver Function Tests: Recent Labs  Lab 02/24/19 1022 02/25/19 0500 02/26/19 0206 02/27/19 0501 02/28/19 0344  AST 105* 100* 70* 67* 60*  ALT 41 42 41 46* 59*  ALKPHOS 30* 28* 40 75 122  BILITOT 1.3* 0.6 0.8 0.8 0.8  PROT 3.8* 4.2* 4.7* 5.6* 5.5*  ALBUMIN 2.5* 2.4* 2.4* 2.6* 2.4*   No results for input(s): LIPASE, AMYLASE in the last 168 hours. No results for input(s): AMMONIA in the  last 168 hours.  CBC: Recent Labs  Lab 02/22/19 1257  02/24/19 1022  02/25/19 0421  02/26/19 0206 02/26/19 0903 02/26/19 1046 02/26/19 1607 02/27/19 0501 02/28/19 0344  WBC 6.9   < > 12.0*  --  12.4*  --  10.7*  --   --   --  14.1* 11.2*  NEUTROABS 3.6  --   --   --   --   --   --   --   --   --   --   --   HGB 14.0   < > 12.0*   < > 10.5*   < > 9.1* 11.6* 8.8* 9.2* 9.3* 8.4*  HCT 42.6   < > 33.9*   < > 29.8*   < > 27.7* 34.0* 26.0* 27.0* 28.8* 26.3*  MCV 87.5   < > 84.8  --  85.6  --  87.9  --   --   --  89.2 89.8  PLT 240   < > 134*  --  92*  --  67*  --   --   --  120* 124*   < > = values in this interval not displayed.    Cardiac Enzymes: Recent Labs  Lab 02/23/19 0458 02/23/19 1314 02/27/19 0024 02/27/19 0501 02/27/19 1304  TROPONINI <0.03 <0.03 1.95* 1.59* 1.28*    BNP: BNP (last 3 results) Recent Labs    02/22/19 1230  BNP 50.0    ProBNP (last 3 results) No results for input(s): PROBNP in the last 8760 hours.    Other results:  Imaging: Dg Chest Port 1 View  Result Date: 02/28/2019 CLINICAL DATA:  58 year old male postoperative day 5 from the emergency repair of type A aortic dissection and CABG. EXAM: PORTABLE CHEST 1 VIEW COMPARISON:  02/27/2019 and earlier. FINDINGS: Portable AP semi upright view at 0537 hours. The medial right chest tube has been removed. No pneumothorax. Right IJ approach introducer sheath has been removed. Right upper extremity approach PICC line remains in place. Stable somewhat low lung volumes. Stable cardiac  size and mediastinal contours. No pulmonary edema, pleural effusion or confluent pulmonary opacity. Visualized tracheal air column is within normal limits. Paucity bowel gas in the upper abdomen. IMPRESSION: 1. Right chest tube removed. No pneumothorax. 2. Low lung volumes. No acute cardiopulmonary abnormality. Electronically Signed   By: Genevie Ann M.D.   On: 02/28/2019 06:32   Dg Chest Port 1 View  Result Date:  02/27/2019 CLINICAL DATA:  Follow-up chest tube EXAM: PORTABLE CHEST 1 VIEW COMPARISON:  02/26/2011 FINDINGS: Right jugular sheath remains in place although the Swan-Ganz catheter has been removed. The endotracheal tube and gastric catheter have been removed as well. Right-sided PICC line and right thoracostomy catheter are noted in satisfactory position. No pneumothorax is seen. Mild right basilar atelectasis is noted. The left lung remains clear. IMPRESSION: No pneumothorax on the right. Mild right basilar atelectasis is noted. Electronically Signed   By: Inez Catalina M.D.   On: 02/27/2019 08:18     Medications:     Scheduled Medications: . sodium chloride   Intravenous Once  . acetaminophen  1,000 mg Oral Q6H   Or  . acetaminophen (TYLENOL) oral liquid 160 mg/5 mL  1,000 mg Per Tube Q6H  . allopurinol  100 mg Oral q morning - 10a  . amiodarone  400 mg Oral Q12H  . aspirin EC  325 mg Oral Daily   Or  . aspirin  324 mg Per Tube Daily  . atorvastatin  40 mg Oral q1800  . bisacodyl  10 mg Oral Daily   Or  . bisacodyl  10 mg Rectal Daily  . budesonide (PULMICORT) nebulizer solution  0.5 mg Nebulization BID  . carvedilol  3.125 mg Oral BID WC  . Chlorhexidine Gluconate Cloth  6 each Topical Daily  . docusate sodium  200 mg Oral Daily  . enoxaparin (LOVENOX) injection  40 mg Subcutaneous Q24H  . furosemide  40 mg Intravenous BID  . insulin aspart  0-15 Units Subcutaneous TID WC  . insulin aspart  0-5 Units Subcutaneous QHS  . insulin detemir  14 Units Subcutaneous BID  . levalbuterol  0.63 mg Nebulization TID  . losartan  50 mg Oral Daily  . mouth rinse  15 mL Mouth Rinse BID  . multivitamin with minerals  1 tablet Oral Daily  . mupirocin ointment   Nasal BID  . pantoprazole  40 mg Oral Daily  . sodium chloride flush  10-40 mL Intracatheter Q12H  . sodium chloride flush  3 mL Intravenous Q12H  . sodium chloride flush  3 mL Intravenous Q12H  . traZODone  100 mg Oral QHS     Infusions: . sodium chloride    . sodium chloride    . sodium chloride 20 mL/hr at 02/24/19 0500  . milrinone 0.25 mcg/kg/min (02/28/19 0741)  . potassium chloride 10 mEq (02/28/19 0754)  . vancomycin 1,500 mg (02/27/19 2327)    PRN Medications: sodium chloride, acetaminophen, ipratropium-albuterol, ondansetron (ZOFRAN) IV, oxyCODONE, sodium chloride flush, sodium chloride flush, sodium chloride flush, traMADol   Studies:    Cardiac cath-PCI 02/23/2019: dLM-ostLAD 45%. pCx 30%, pLAD 30%.  pRCA 90% --> 2 overlapping SYNERGY DES 2.5X16 & 2.75 x 12 --> second stent placed due to iatrogenic aortic root dissection thought to be related to guide catheter trauma. -->  Taken for urgent dissection repair.      Assessment:   57 y/o smoker with DM2 admitted with [place]. Found to have 90% ostial RCA lesion which was successfully stented. Unfortunately case  c/b Type A aortic dissection requiring emergent surgical repair on 02/23/19  Plan/Discussion:    1. [place] with CAD s/p stenting of high-grade ostial RCA on 02/23/19 followed by CABG with SVG -> RCA  Remains on aspirin, statin and carvedilol.  He did have a DES stent placed.  Would like to restart Plavix when possible -with postop A. fib and likely DOAC requirement, would probably not use aspirin  Shoulder pain thought to be related to adhesive capsulitis.  Recommend PT OT.  Intra-op TEE EF 60-65%  -transthoracic echo not ordered.  Converted from Lasix drip to twice daily Lasix yesterday.  Wean milrinone per CT surgery -increased today because of co-ox 56.  2. Type I aortic dissection  s/p repair 2/26  3. Post-op AF/AFL -unsuccessful rapid atrial pacing leading to VT episodes.  Started on IV amiodarone load now converted to 400 mg twice daily.  Plan will be 400 twice daily for 1 week then 200 mg twice daily for 1 week followed by 200 mg daily.  Per Dr. Servando Snare recommendation, was wanting to hold off on DOAC/apixaban until after  chest tubes out.    If no further atrial fibrillation, could convert back to aspirin and Plavix, however for now would probably consider Plavix plus apixaban.  4. COPD  Smoking cessation counseling  On vancomycin for MSSA in sputum.  5. DM2  Resume home losartan as able  consider starting Jardiance on discharge  6. Hypokalemia  Supplementation ordered.  7. Shoulder pain -suspected to be MSK related.  Unlikely be ischemic.  Continue PT  8.  Essential hypertension -on carvedilol, added ARB today.   Length of Stay: 5   Glenetta Hew MD 02/28/2019, 8:12 AM   Please contact [facility] for night-coverage after hours (4p -7a ) and weekends on [email or web address]

## 2019-02-28 NOTE — Progress Notes (Signed)
Nutrition Follow Up  DOCUMENTATION CODES:   Obesity unspecified  INTERVENTION:    Recommend diet change to Heart Healthy to better meet kcal needs  Ensure Enlive po BID, each supplement provides 350 kcal and 20 grams of protein  NUTRITION DIAGNOSIS:   Inadequate oral intake now related to decreased appetite as evidenced by per patient/family report, ongoing  GOAL:   Patient will meet greater than or equal to 90% of their needs, unmet  MONITOR:   PO intake, Supplement acceptance, Labs, Skin, Weight trends, I & O's  ASSESSMENT:   58 y.o. Male with PMH of significant for COPD, gout, HTN, class II obesity, prior history of stroke and CAD; presented to the emergency department secondary to worsening shortness of breath and intermittent chest discomfort.   2/26 s/p CABG & repair of thoracic aortic dissection 2/29 extubated  RD spoke with pt today; he is resting; wife visiting. He reports his appetite is decreased; not eating that well. PO intake 10% this AM at breakfast per flowsheet records.  Medications include daily MVI, Lasix, Protonix & Zofran. Pt is amenable to trying nutrition supplements here. Labs reviewed. Na 133 (L). K 3.1 (L).  CBG's [id number].  Heart Healthy diet provides ~ 1850 kcals, 88 gm protein. HH/Carbohydrate Modified provides ~ 1650 kcals, 86 gm protein.  Diet Order:   Diet Order            Diet heart healthy/carb modified Room service appropriate? Yes; Fluid consistency: Thin  Diet effective now             EDUCATION NEEDS:   Not appropriate for education at this time  Skin:  Skin Assessment: Skin Integrity Issues: Skin Integrity Issues:: Incisions Incisions: chest and R knee  Last BM:  3/1   Intake/Output Summary (Last 24 hours) at 02/28/2019 1618 Last data filed at 02/28/2019 1500 Gross per 24 hour  Intake 3830.18 ml  Output 3450 ml  Net 380.18 ml   Height:   Ht Readings from Last 1 Encounters:  02/22/19 5' 8"  (1.727 m)    Weight:   Wt Readings from Last 1 Encounters:  02/28/19 111.5 kg  Admit wt          103.9 kg   Ideal Body Weight:  70 kg  BMI:  Body mass index is 37.37 kg/m.  Estimated Nutritional Needs:   Kcal:  2100-2300  Protein:  120-135 gm  Fluid:  per MD  Arthur Holms, RD, LDN Pager #: [phone] After-Hours Pager #: [phone]

## 2019-03-01 ENCOUNTER — Inpatient Hospital Stay (HOSPITAL_COMMUNITY): Payer: Medicare Other

## 2019-03-01 LAB — GLUCOSE, CAPILLARY
Glucose-Capillary: 104 mg/dL — ABNORMAL HIGH (ref 70–99)
Glucose-Capillary: 127 mg/dL — ABNORMAL HIGH (ref 70–99)
Glucose-Capillary: 105 mg/dL — ABNORMAL HIGH (ref 70–99)
Glucose-Capillary: 102 mg/dL — ABNORMAL HIGH (ref 70–99)

## 2019-03-01 LAB — COMPREHENSIVE METABOLIC PANEL
ALT: 42 U/L (ref 0–44)
AST: 38 U/L (ref 15–41)
Albumin: 2.2 g/dL — ABNORMAL LOW (ref 3.5–5.0)
Alkaline Phosphatase: 104 U/L (ref 38–126)
Anion gap: 12 (ref 5–15)
BUN: 33 mg/dL — ABNORMAL HIGH (ref 6–20)
CO2: 29 mmol/L (ref 22–32)
Calcium: 7.9 mg/dL — ABNORMAL LOW (ref 8.9–10.3)
Chloride: 90 mmol/L — ABNORMAL LOW (ref 98–111)
Creatinine, Ser: 1.11 mg/dL (ref 0.61–1.24)
GFR calc Af Amer: >60 mL/min (ref >60)
GFR calc non Af Amer: >60 mL/min (ref >60)
Glucose, Bld: 160 mg/dL — ABNORMAL HIGH (ref 70–99)
Potassium: 2.7 mmol/L — CL (ref 3.5–5.1)
Sodium: 131 mmol/L — ABNORMAL LOW (ref 135–145)
Total Bilirubin: 1.1 mg/dL (ref 0.3–1.2)
Total Protein: 5.1 g/dL — ABNORMAL LOW (ref 6.5–8.1)

## 2019-03-01 LAB — CBC
HCT: 23.9 % — ABNORMAL LOW (ref 39.0–52.0)
Hemoglobin: 7.7 g/dL — ABNORMAL LOW (ref 13.0–17.0)
MCH: 29.1 pg (ref 26.0–34.0)
MCHC: 32.2 g/dL (ref 30.0–36.0)
MCV: 90.2 fL (ref 80.0–100.0)
Platelets: 138 10*3/uL — ABNORMAL LOW (ref 150–400)
RBC: 2.65 MIL/uL — ABNORMAL LOW (ref 4.22–5.81)
RDW: 14.1 % (ref 11.5–15.5)
WBC: 10.9 10*3/uL — ABNORMAL HIGH (ref 4.0–10.5)
nRBC: 0.5 % — ABNORMAL HIGH (ref 0.0–0.2)

## 2019-03-01 LAB — BASIC METABOLIC PANEL
Anion gap: 9 (ref 5–15)
BUN: 27 mg/dL — ABNORMAL HIGH (ref 6–20)
CO2: 30 mmol/L (ref 22–32)
Calcium: 8.4 mg/dL — ABNORMAL LOW (ref 8.9–10.3)
Chloride: 95 mmol/L — ABNORMAL LOW (ref 98–111)
Creatinine, Ser: 1.04 mg/dL (ref 0.61–1.24)
GFR calc Af Amer: >60 mL/min (ref >60)
GFR calc non Af Amer: >60 mL/min (ref >60)
Glucose, Bld: 120 mg/dL — ABNORMAL HIGH (ref 70–99)
Potassium: 3.6 mmol/L (ref 3.5–5.1)
Sodium: 134 mmol/L — ABNORMAL LOW (ref 135–145)

## 2019-03-01 LAB — COOXEMETRY PANEL
Carboxyhemoglobin: 1.8 % — ABNORMAL HIGH (ref 0.5–1.5)
Methemoglobin: 1.1 % (ref 0.0–1.5)
O2 Saturation: 57.9 %
Total hemoglobin: 9 g/dL — ABNORMAL LOW (ref 12.0–16.0)

## 2019-03-01 LAB — MAGNESIUM: Magnesium: 2.2 mg/dL (ref 1.7–2.4)

## 2019-03-01 MED ORDER — MILRINONE LACTATE IN DEXTROSE 20-5 MG/100ML-% IV SOLN
0.1250 ug/kg/min | INTRAVENOUS | Status: DC
  Administered 2019-03-02: 0.125 ug/kg/min via INTRAVENOUS
  Filled 2019-03-01: qty 100

## 2019-03-01 MED ORDER — KETOROLAC TROMETHAMINE 15 MG/ML IJ SOLN: 15.0000 mg | Freq: Once | INTRAMUSCULAR | Status: DC

## 2019-03-01 MED ORDER — LEVALBUTEROL HCL 0.63 MG/3ML IN NEBU: 0.6300 mg | INHALATION_SOLUTION | Freq: Two times a day (BID) | RESPIRATORY_TRACT | Status: DC

## 2019-03-01 MED ORDER — POTASSIUM CHLORIDE 10 MEQ/50ML IV SOLN
10.0000 meq | INTRAVENOUS | Status: AC
  Administered 2019-03-01 (×3): 10 meq via INTRAVENOUS

## 2019-03-01 MED ORDER — CARVEDILOL 6.25 MG PO TABS
6.2500 mg | ORAL_TABLET | ORAL | Status: AC
  Administered 2019-03-01: 6.25 mg via ORAL
  Filled 2019-03-01: qty 1

## 2019-03-01 MED ORDER — CARVEDILOL 12.5 MG PO TABS
12.5000 mg | ORAL_TABLET | Freq: Two times a day (BID) | ORAL | Status: DC
  Administered 2019-03-01 – 2019-03-03 (×4): 12.5 mg via ORAL
  Filled 2019-03-01 (×4): qty 1

## 2019-03-01 MED ORDER — CARVEDILOL 6.25 MG PO TABS: 6.2500 mg | ORAL_TABLET | Freq: Two times a day (BID) | ORAL | Status: DC

## 2019-03-01 MED ORDER — POTASSIUM CHLORIDE CRYS ER 20 MEQ PO TBCR
40.0000 meq | EXTENDED_RELEASE_TABLET | Freq: Two times a day (BID) | ORAL | Status: DC
  Administered 2019-03-01 – 2019-03-03 (×5): 40 meq via ORAL
  Filled 2019-03-01 (×5): qty 2

## 2019-03-01 MED ORDER — FE FUMARATE-B12-VIT C-FA-IFC PO CAPS
1.0000 | ORAL_CAPSULE | Freq: Three times a day (TID) | ORAL | Status: DC
  Administered 2019-03-01 – 2019-03-05 (×8): 1 via ORAL
  Filled 2019-03-01 (×9): qty 1

## 2019-03-01 MED ORDER — POTASSIUM CHLORIDE 10 MEQ/50ML IV SOLN
10.0000 meq | INTRAVENOUS | Status: AC
  Administered 2019-03-01 (×2): 10 meq via INTRAVENOUS
  Filled 2019-03-01 (×2): qty 50

## 2019-03-01 MED FILL — Sodium Bicarbonate IV Soln 8.4%: INTRAVENOUS | Qty: 150 | Status: AC

## 2019-03-01 MED FILL — Lidocaine HCl(Cardiac) IV PF Soln Pref Syr 100 MG/5ML (2%): INTRAVENOUS | Qty: 5 | Status: AC

## 2019-03-01 MED FILL — Electrolyte-R (PH 7.4) Solution: INTRAVENOUS | Qty: 8000 | Status: AC

## 2019-03-01 MED FILL — Mannitol IV Soln 20%: INTRAVENOUS | Qty: 500 | Status: AC

## 2019-03-01 MED FILL — Sodium Chloride IV Soln 0.9%: INTRAVENOUS | Qty: 5000 | Status: AC

## 2019-03-01 MED FILL — Albumin, Human Inj 5%: INTRAVENOUS | Qty: 250 | Status: AC

## 2019-03-01 MED FILL — Calcium Chloride Inj 10%: INTRAVENOUS | Qty: 10 | Status: AC

## 2019-03-01 MED FILL — Potassium Chloride Inj 2 mEq/ML: INTRAVENOUS | Qty: 40 | Status: AC

## 2019-03-01 MED FILL — Magnesium Sulfate Inj 50%: INTRAMUSCULAR | Qty: 10 | Status: AC

## 2019-03-01 MED FILL — Heparin Sodium (Porcine) Inj 1000 Unit/ML: INTRAMUSCULAR | Qty: 30 | Status: AC

## 2019-03-01 MED FILL — Heparin Sodium (Porcine) Inj 1000 Unit/ML: INTRAMUSCULAR | Qty: 10 | Status: AC

## 2019-03-01 NOTE — Progress Notes (Signed)
6 Days Post-Op Procedure(s) (LRB): REPAIR OF TYPE A  - ACUTE ASCENDING THORACIC AORTIC DISSECTION, using Hemashield Platinum Woven Double Velour Vascular Graft (D: 50m, L: 30cm) (N/A) CORONARY ARTERY BYPASS GRAFTING (CABG) x 1; Using Endoscopically Harvested Right Leg Greater Saphenous Vein Graft (SVG); SVG to RCA (N/A) TRANSESOPHAGEAL ECHOCARDIOGRAM (TEE) (N/A) Subjective: Feels well, back in sinus rhythm for 12 hours Chest x-ray clear blood pressure stable Co-oximetry improved to 58% We will stop IV amiodarone, continue oral dose of amiodarone and reduce milrinone to 0.125 With aortic dissection and large graft in the pericardium patient is not a good candidate for oral anticoagulation.  Continue prophylactic dose Lovenox only at this point. If patient maintains safe rhythm and milrinone is weaned off he should be able to move to progressive care 4 E. Tomorrow Continue IV Lasix diuresis.  Supplement potassium. Expected postop blood loss anemia-continue oral iron supplement.     Objective: Vital signs in last 24 hours: Temp:  [98.2 F (36.8 C)-99.1 F (37.3 C)] 98.5 F (36.9 C) (03/03 0746) Pulse Rate:  [76-146] 89 (03/03 1000) Cardiac Rhythm: Normal sinus rhythm (03/03 0800) Resp:  [14-39] 20 (03/03 1000) BP: (67-156)/(43-115) 135/69 (03/03 1000) SpO2:  [92 %-100 %] 99 % (03/03 1000) Weight:  [110.7 kg] 110.7 kg (03/03 0500)  Hemodynamic parameters for last 24 hours: CVP:  [14 mmHg-18 mmHg] 14 mmHg  Intake/Output from previous day: 03/02 0701 - 03/03 0700 In: 3316.2 [P.O.:1038; I.V.:432; IV Piggyback:1846.2] Out: 2325 [Urine:2325] Intake/Output this shift: Total I/O In: 107.1 [I.V.:98.6; IV Piggyback:8.5] Out: -   Neuro intact except for right hand weak grip Lungs clear Heart rhythm regular without murmur Surgical incisions clean and dry 2+ leg edema  Lab Results: Recent Labs    02/28/19 0344 03/01/19 0334  WBC 11.2* 10.9*  HGB 8.4* 7.7*  HCT 26.3* 23.9*  PLT  124* 138*   BMET:  Recent Labs    02/28/19 1515 03/01/19 0334  NA 133* 131*  K 3.1* 2.7*  CL 89* 90*  CO2 31 29  GLUCOSE 123* 160*  BUN 29* 33*  CREATININE 1.42* 1.11  CALCIUM 8.0* 7.9*    PT/INR: No results for input(s): LABPROT, INR in the last 72 hours. ABG    Component Value Date/Time   PHART 7.541 (H) 02/26/2019 1607   HCO3 31.5 (H) 02/26/2019 1607   TCO2 33 (H) 02/26/2019 1607   ACIDBASEDEF 3.0 (H) 02/24/2019 0155   O2SAT 57.9 03/01/2019 0350   CBG (last 3)  Recent Labs    02/28/19 1524 02/28/19 2229 03/01/19 0645  GLUCAP 106* 109* 104*    Assessment/Plan: S/P Procedure(s) (LRB): REPAIR OF TYPE A  - ACUTE ASCENDING THORACIC AORTIC DISSECTION, using Hemashield Platinum Woven Double Velour Vascular Graft (D: 270m L: 30cm) (N/A) CORONARY ARTERY BYPASS GRAFTING (CABG) x 1; Using Endoscopically Harvested Right Leg Greater Saphenous Vein Graft (SVG); SVG to RCA (N/A) TRANSESOPHAGEAL ECHOCARDIOGRAM (TEE) (N/A) Treat atrial fibrillation Oral potassium supplement hypokalemia Oral iron vitamins to treat expected postoperative blood loss anemia Wean milrinone and follow oximetry In ICU today   LOS: 6 days    PeTharon Aquasrigt III 03/01/2019

## 2019-03-01 NOTE — Progress Notes (Signed)
Occupational Therapy treatment Note    03/01/19 1829  OT Visit Information  Last OT Received On 03/01/19  History of Present Illness Pt adm with chest pain from [facility]. Pt underwent cardiac cath with stent placement. 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 Underwent emergent repair of aortic dissection and CABG x 1 on 2/26. PMH - CAD, gout, copd, CVA, back surgery  Precautions  Precautions Sternal;Fall  Pain Assessment  Pain Assessment Faces  Pain Score 6  Pain Location palpation to anterior shoulder  Pain Descriptors / Indicators Discomfort;Grimacing;Aching  Pain Intervention(s) Limited activity within patient's tolerance  ADL  General ADL Comments issued red tubing and had pt practice hand to mouth pattern; issued lidded cup and taught pt compensatory technqieus for feeding and using cup and B hand s to drink.   Exercises  Exercises Other exercises  Other Exercises  Other Exercises ER  - PROM LUE using his strong RUE  Other Exercises squeeze ball - coordination activities  OT - End of Session  Activity Tolerance Patient tolerated treatment well  Patient left in bed;with call bell/phone within reach;with family/visitor present  Nurse Communication Mobility status;Other (comment) (ortho consult)  OT Assessment  OT Recommendation/Assessment Patient needs continued OT Services  OT Visit Diagnosis Unsteadiness on feet (R26.81);Muscle weakness (generalized) (M62.81);Pain  Pain - Right/Left Left  Pain - part of body Shoulder  OT Problem List Decreased strength;Decreased range of motion;Decreased activity tolerance;Impaired balance (sitting and/or standing);Decreased coordination;Decreased knowledge of use of DME or AE;Decreased knowledge of precautions;Cardiopulmonary status limiting activity;Impaired sensation;Obesity;Impaired UE functional use;Pain  OT Plan  OT Frequency (ACUTE ONLY) Min 3X/week  OT Treatment/Interventions (ACUTE ONLY) Self-care/ADL training;Therapeutic  exercise;Neuromuscular education;DME and/or AE instruction;Therapeutic activities;Patient/family education  AM-PAC OT "6 Clicks" Daily Activity Outcome Measure (Version 2)  Help from another person eating meals? 3  Help from another person taking care of personal grooming? 3  Help from another person toileting, which includes using toliet, bedpan, or urinal? 3  Help from another person bathing (including washing, rinsing, drying)? 2  Help from another person to put on and taking off regular upper body clothing? 2  Help from another person to put on and taking off regular lower body clothing? 3  6 Click Score 16  OT Recommendation  Recommendations for Other Services  (ortho consult)  Follow Up Recommendations Outpatient OT;Supervision - Intermittent  OT Equipment None recommended by OT  Individuals Consulted  Consulted and Agree with Results and Recommendations Patient  Acute Rehab OT Goals  Patient Stated Goal to be able to use my hands and arm again   OT Goal Formulation With patient  Time For Goal Achievement 03/15/19  Potential to Achieve Goals Good  OT Time Calculation  OT Start Time (ACUTE ONLY) 1735  OT Stop Time (ACUTE ONLY) 1755  OT Time Calculation (min) 20 min  OT General Charges  $OT Visit 1 Visit  OT Treatments  $Therapeutic Activity 8-22 mins  Maurie Boettcher, OT/L   Acute OT Clinical Specialist Acute Rehabilitation Services Pager [phone] Office [phone]

## 2019-03-01 NOTE — Progress Notes (Signed)
6 Days Post-Op Procedure(s) (LRB): REPAIR OF TYPE A  - ACUTE ASCENDING THORACIC AORTIC DISSECTION, using Hemashield Platinum Woven Double Velour Vascular Graft (D: 54m, L: 30cm) (N/A) CORONARY ARTERY BYPASS GRAFTING (CABG) x 1; Using Endoscopically Harvested Right Leg Greater Saphenous Vein Graft (SVG); SVG to RCA (N/A) TRANSESOPHAGEAL ECHOCARDIOGRAM (TEE) (N/A) Subjective: Awake and alert.  Sitting up in the bedside chair.  No new problems reported by patient. He was in and out of atrial fibrillation yesterday.  He is currently in sinus rhythm. He remains on an amiodarone infusion as well as milrinone at 0.25 mics per kilo per minute.  Co-ox was 57 this morning.  Objective: Vital signs in last 24 hours: Temp:  [98.2 F (36.8 C)-99.1 F (37.3 C)] 98.5 F (36.9 C) (03/03 0746) Pulse Rate:  [76-146] 92 (03/03 0716) Cardiac Rhythm: Normal sinus rhythm (03/03 0000) Resp:  [14-39] 21 (03/03 0716) BP: (67-156)/(43-115) 145/63 (03/03 0716) SpO2:  [92 %-100 %] 100 % (03/03 0716) Weight:  [110.7 kg] 110.7 kg (03/03 0500)  Hemodynamic parameters for last 24 hours: CVP:  [17 mmHg-18 mmHg] 17 mmHg  Intake/Output from previous day: 03/02 0701 - 03/03 0700 In: 3316.2 [P.O.:1038; I.V.:432; IV Piggyback:1846.2] Out: 2325 [Urine:2325] Intake/Output this shift: No intake/output data recorded.  General appearance: alert, cooperative and mild distress Heart: Currently in sinus rhythm.  Review of the monitor shows periods of atrial fibrillation yesterday and last night with some wide-complex tachycardia that is also irregularly irregular Lungs: clear to auscultation bilaterally Extremities: Mild peripheral edema Wound: The sternotomy and right chest incisions are dry.  Lab Results: Recent Labs    02/28/19 0344 03/01/19 0334  WBC 11.2* 10.9*  HGB 8.4* 7.7*  HCT 26.3* 23.9*  PLT 124* 138*   BMET:  Recent Labs    02/28/19 1515 03/01/19 0334  NA 133* 131*  K 3.1* 2.7*  CL 89* 90*  CO2  31 29  GLUCOSE 123* 160*  BUN 29* 33*  CREATININE 1.42* 1.11  CALCIUM 8.0* 7.9*    PT/INR: No results for input(s): LABPROT, INR in the last 72 hours. ABG    Component Value Date/Time   PHART 7.541 (H) 02/26/2019 1607   HCO3 31.5 (H) 02/26/2019 1607   TCO2 33 (H) 02/26/2019 1607   ACIDBASEDEF 3.0 (H) 02/24/2019 0155   O2SAT 57.9 03/01/2019 0350   CBG (last 3)  Recent Labs    02/28/19 1524 02/28/19 2229 03/01/19 0645  GLUCAP 106* 109* 104*    Assessment/Plan: S/P Procedure(s) (LRB): REPAIR OF TYPE A  - ACUTE ASCENDING THORACIC AORTIC DISSECTION, using Hemashield Platinum Woven Double Velour Vascular Graft (D: 214m L: 30cm) (N/A) CORONARY ARTERY BYPASS GRAFTING (CABG) x 1; Using Endoscopically Harvested Right Leg Greater Saphenous Vein Graft (SVG); SVG to RCA (N/A) TRANSESOPHAGEAL ECHOCARDIOGRAM (TEE) (N/A)  -Postop day 6 emergency repair of acute type pain aortic dissection and single-vessel coronary bypass grafting.  Stable hemodynamics but remains on IV milrinone.  Will discuss management with Dr. VaPrescott Gum Continue diuresis.  -Recurring, paroxysmal atrial fibrillation yesterday and last night.  Currently in sinus rhythm on IV and oral amiodarone.  Potassium was 2.7 this morning.  He has received 3 rounds of IV potassium supplement.  We will also check a serum magnesium level and correct if needed.  Should be able to discontinue the IV amiodarone since he has overlapped with oral amiodarone for the past 24 hours.  -Hypertension.  Blood pressure controlled with carvedilol and losartan  -DVT prophylaxis-continue subcu enoxaparin, mobilize.  LOS: 6 days    Antony Odea, PA-C [phone] 03/01/2019

## 2019-03-01 NOTE — Progress Notes (Signed)
CT surgery p.m. Rounds  Patient resting comfortably Remaining in sinus rhythm off IV amiodarone Diuresing well P.m. potassium improved

## 2019-03-01 NOTE — Progress Notes (Addendum)
Occupational Therapy Evaluation Patient Details Name: [name] [name] MRN: [id number] DOB: August 01, 1961 Today's Date: 03/01/2019    History of Present Illness Pt adm with chest pain from [facility]. Pt underwent cardiac cath with stent placement. 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 Underwent emergent repair of aortic dissection and CABG x 1 on 2/26. PMH - CAD, gout, copd, CVA, back surgery   Clinical Impression   PTA, pt independent with ADL and mobility. Pt limited by BUE deficits. Pt with apparent infraspinatus weakness L shoulder. Unable to lift off into external rotation. ? Brachial plexus involvement.  Will further assess. Sensation and hand function is normal LUE. RUE shoulder and elbow strength and ROM is WFL. When wrist is blocked, pt is unable to flex fingers or demonstrate any intrinsic movement. Minimal movement of thumb noted. Apparent median and ulnar distribution nerve involvement.  Pt is only using tenodesis grasp to use hand. Wrist flexion 3+/5. Complains of "tingling sensation in hand. Pt requires Mod A with ADL due to deficits listed below.  Recommend ortho consult to further evaluate L apparent RTC insufficiency and R hand deficit.  Pt will need to follow up with outpt OT.  Will follow acutely.     Follow Up Recommendations  Outpatient OT;Supervision - Intermittent    Equipment Recommendations  None recommended by OT    Recommendations for Other Services Other (comment)(ortho consult)     Precautions / Restrictions Precautions Precautions: Sternal;Fall Restrictions Weight Bearing Restrictions: (sternal)      Mobility Bed Mobility Overal bed mobility: Needs Assistance Bed Mobility: Sit to Sidelying         Sit to sidelying: Min guard    Transfers Overall transfer level: Needs assistance   Transfers: Sit to/from Stand Sit to Stand: Min guard         General transfer comment: reviewed sternal precautions    Balance Overall balance  assessment: Needs assistance   Sitting balance-Leahy Scale: Good       Standing balance-Leahy Scale: Fair                             ADL either performed or assessed with clinical judgement   ADL Overall ADL's : Needs assistance/impaired Eating/Feeding: Moderate assistance   Grooming: Moderate assistance   Upper Body Bathing: Moderate assistance   Lower Body Bathing: Moderate assistance   Upper Body Dressing : Moderate assistance   Lower Body Dressing: Moderate assistance   Toilet Transfer: Minimal assistance           Functional mobility during ADLs: Minimal assistance General ADL Comments: will benefit form AE1     Vision         Perception     Praxis      Pertinent Vitals/Pain Pain Assessment: Faces Faces Pain Scale: Hurts even more Pain Location: palpation to anterior shoulder Pain Descriptors / Indicators: Discomfort;Grimacing;Aching Pain Intervention(s): Limited activity within patient's tolerance     Hand Dominance Right   Extremity/Trunk Assessment Upper Extremity Assessment Upper Extremity Assessment: RUE deficits/detail;LUE deficits/detail RUE Deficits / Details: shoulder elbow strength WFL. wrist ext [facility]. Wrist flexion 4/5; finger and wrist extension 4/5. trace digit flexion; abduction; active thumb movement - will further assess; complains of tingling R hand; absent in-hand manipulation; unable to make fist - using tenodesis grasp RUE Sensation: decreased light touch RUE Coordination: decreased fine motor LUE Deficits / Details: elbow/wrist/hand WFL; weak shoulder. unable to lift off stomach/no external rotation/infraspinatus weakness.  will further assess LUE Coordination: decreased gross motor   Lower Extremity Assessment Lower Extremity Assessment: Overall WFL for tasks assessed   Cervical / Trunk Assessment Cervical / Trunk Assessment: Normal   Communication Communication Communication: No difficulties   Cognition  Arousal/Alertness: Awake/alert Behavior During Therapy: WFL for tasks assessed/performed Overall Cognitive Status: Within Functional Limits for tasks assessed                                     General Comments       Exercises     Shoulder Instructions      Home Living Family/patient expects to be discharged to:: Private residence Living Arrangements: Spouse/significant other Available Help at Discharge: Family Type of Home: House Home Access: Stairs to enter Technical brewer of Steps: 1   Home Layout: One level     Bathroom Shower/Tub: Tub/shower unit;Walk-in shower   Bathroom Toilet: Standard Bathroom Accessibility: Yes How Accessible: Accessible via walker Home Equipment: Cane - single point;Shower seat          Prior Functioning/Environment Level of Independence: Independent                 OT Problem List: Decreased strength;Decreased range of motion;Decreased activity tolerance;Impaired balance (sitting and/or standing);Decreased coordination;Decreased knowledge of use of DME or AE;Decreased knowledge of precautions;Cardiopulmonary status limiting activity;Impaired sensation;Obesity;Impaired UE functional use;Pain      OT Treatment/Interventions: Self-care/ADL training;Therapeutic exercise;Neuromuscular education;DME and/or AE instruction;Therapeutic activities;Patient/family education    OT Goals(Current goals can be found in the care plan section) Acute Rehab OT Goals Patient Stated Goal: to be able to use my hands and arm again  OT Goal Formulation: With patient Time For Goal Achievement: 03/15/19 Potential to Achieve Goals: Good  OT Frequency: Min 3X/week   Barriers to D/C:            Co-evaluation              AM-PAC OT "6 Clicks" Daily Activity     Outcome Measure Help from another person eating meals?: A Little Help from another person taking care of personal grooming?: A Little Help from another person  toileting, which includes using toliet, bedpan, or urinal?: A Little Help from another person bathing (including washing, rinsing, drying)?: A Lot Help from another person to put on and taking off regular upper body clothing?: A Lot Help from another person to put on and taking off regular lower body clothing?: A Little 6 Click Score: 16   End of Session Nurse Communication: Mobility status;Other (comment)(need for ortho consult)  Activity Tolerance: Patient tolerated treatment well Patient left: in bed;with call bell/phone within reach;with family/visitor present  OT Visit Diagnosis: Unsteadiness on feet (R26.81);Muscle weakness (generalized) (M62.81);Pain Pain - Right/Left: Left Pain - part of body: Shoulder                Time: 1641-1700 OT Time Calculation (min): 19 min Charges:  OT General Charges $OT Visit: 1 Visit OT Evaluation $OT Eval Moderate Complexity: [place], OT/L   Acute OT Clinical Specialist [place] Pager [phone] Office [phone]   [facility] 03/01/2019, 6:21 PM

## 2019-03-01 NOTE — Progress Notes (Signed)
[facility] Rounding Note   Subjective:    POD # 6Repair of Type A aortic dissection and CABG with SVG to RCA  REPAIR OF TYPE A  - ACUTE ASCENDING THORACIC AORTIC DISSECTION, using Hemashield Platinum Woven Double Velour Vascular Graft (D: 71m, L: 30cm) (N/A) CORONARY ARTERY BYPASS GRAFTING (CABG) x 1; Using Endoscopically Harvested Right Leg Greater Saphenous Vein Graft (SVG); SVG to RCA (N/A)  Had return of A. fib yesterday with increasing milrinone dose, was re-bolused with IV amiodarone overnight.  Now switched back to oral amiodarone. Milrinone dose reduced back to 0.125.  Continue to monitor.  Dr. VPrescott Gumprefers not to do oral anticoagulation, therefore continue Lovenox. Was converted to oral antibiotic. Patient seems doing well.  No chest pain or pressure.  Had lots of questions answered today. Has some right arm weakness from cath site but otherwise doing well.  No bleeding.  No coughing.  No fevers.    Objective:   Weight Range:  Vital Signs:   Temp:  [98 F (36.7 C)-99.1 F (37.3 C)] 98 F (36.7 C) (03/03 1121) Pulse Rate:  [76-146] 84 (03/03 1300) Resp:  [14-39] 23 (03/03 1300) BP: (67-156)/(50-107) 142/61 (03/03 1300) SpO2:  [92 %-100 %] 100 % (03/03 1300) Weight:  [110.7 kg] 110.7 kg (03/03 0500) Last BM Date: 02/28/19  Weight change: Filed Weights   02/26/19 0102 02/28/19 0500 03/01/19 0500  Weight: 117.2 kg 111.5 kg 110.7 kg    Intake/Output:   Intake/Output Summary (Last 24 hours) at 03/01/2019 1426 Last data filed at 03/01/2019 1300 Gross per 24 hour  Intake 1423.08 ml  Output 1575 ml  Net -151.92 ml     Physical Exam  Constitutional: He is oriented to person, place, and time. He appears well-developed and well-nourished. No distress.  Healthy-appearing  HENT:  Head: Normocephalic and atraumatic.  Neck: Normal range of motion. Neck supple. No JVD present.  Cardiovascular: Normal rate and regular rhythm.  No extrasystoles are  present. PMI is not displaced. Exam reveals distant heart sounds, friction rub and decreased pulses (Mildly decreased pedal pulses bilaterally). Exam reveals no gallop.  No murmur heard. Pulmonary/Chest: Effort normal and breath sounds normal. No respiratory distress. He has no wheezes. He has no rales. He exhibits tenderness (Postop).  Sternal wound well-healed; mild coarse breath sounds, but no rales or rhonchi.  Abdominal: Soft. Bowel sounds are normal. He exhibits no distension. There is no abdominal tenderness. There is no rebound.  Musculoskeletal: Normal range of motion.        General: Edema (Trivial bilateral) present.     Comments: Decreased range of motion of left shoulder.  Decreased handgrip right worse than left.  Neurological: He is alert and oriented to person, place, and time.  Psychiatric: He has a normal mood and affect. His behavior is normal. Judgment normal.  Vitals reviewed.  Telemetry: --Was intermittently in A. fib RVR (right SVT because of persistent ST elevations.  Now currently in sinus rhythm rates in the 70s 80s.  No new EKG  Labs: Basic Metabolic Panel: Recent Labs  Lab 02/24/19 0500 02/24/19 1022  02/25/19 0421  02/26/19 0206  02/26/19 1607 02/27/19 0501 02/28/19 0344 02/28/19 1515 03/01/19 0334  NA 142 144   < >  --    < > 141   < > 137 137 133* 133* 131*  K 4.1 3.8   < >  --    < > 3.5   < > 3.4* 3.1* 3.1* 3.1*  2.7*  CL 110 113*  --   --    < > 107  --   --  93* 89* 89* 90*  CO2 24 24  --   --    < > 27  --   --  31 34* 31 29  GLUCOSE 117* 109*  --   --    < > 172*  --   --  117* 129* 123* 160*  BUN 14 18  --   --    < > 24*  --   --  20 19 29* 33*  CREATININE 0.91 1.03  --   --    < > 1.06  --   --  0.89 0.74 1.42* 1.11  CALCIUM 7.1* 7.4*  --   --    < > 8.6*  --   --  8.7* 8.4* 8.0* 7.9*  MG 1.7 2.3  --  2.2  --   --   --   --  1.9  --   --  2.2   < > = values in this interval not displayed.    Liver Function Tests: Recent Labs  Lab  02/25/19 0500 02/26/19 0206 02/27/19 0501 02/28/19 0344 03/01/19 0334  AST 100* 70* 67* 60* 38  ALT 42 41 46* 59* 42  ALKPHOS 28* 40 75 122 104  BILITOT 0.6 0.8 0.8 0.8 1.1  PROT 4.2* 4.7* 5.6* 5.5* 5.1*  ALBUMIN 2.4* 2.4* 2.6* 2.4* 2.2*   No results for input(s): LIPASE, AMYLASE in the last 168 hours. No results for input(s): AMMONIA in the last 168 hours.  CBC: Recent Labs  Lab 02/25/19 0421  02/26/19 0206  02/26/19 1046 02/26/19 1607 02/27/19 0501 02/28/19 0344 03/01/19 0334  WBC 12.4*  --  10.7*  --   --   --  14.1* 11.2* 10.9*  HGB 10.5*   < > 9.1*   < > 8.8* 9.2* 9.3* 8.4* 7.7*  HCT 29.8*   < > 27.7*   < > 26.0* 27.0* 28.8* 26.3* 23.9*  MCV 85.6  --  87.9  --   --   --  89.2 89.8 90.2  PLT 92*  --  67*  --   --   --  120* 124* 138*   < > = values in this interval not displayed.    Cardiac Enzymes: Recent Labs  Lab 02/23/19 0458 02/23/19 1314 02/27/19 0024 02/27/19 0501 02/27/19 1304  TROPONINI <0.03 <0.03 1.95* 1.59* 1.28*    BNP: BNP (last 3 results) Recent Labs    02/22/19 1230  BNP 50.0    ProBNP (last 3 results) No results for input(s): PROBNP in the last 8760 hours.    Other results:  Imaging: Dg Chest Port 1 View  Result Date: 03/01/2019 CLINICAL DATA:  Status post CABG EXAM: PORTABLE CHEST 1 VIEW COMPARISON:  Chest radiograph from one day prior. FINDINGS: Right PICC terminates at the cavoatrial junction. Skin staples and surgical clips overlie the right axilla. Intact sternotomy wires. Stable cardiomediastinal silhouette with mild cardiomegaly. No pneumothorax. No pleural effusion. Cephalization of the pulmonary vasculature without overt pulmonary edema. No acute consolidative airspace disease. IMPRESSION: Stable mild cardiomegaly without overt pulmonary edema. Electronically Signed   By: Ilona Sorrel M.D.   On: 03/01/2019 09:23   Dg Chest Port 1 View  Result Date: 02/28/2019 CLINICAL DATA:  58 year old male postoperative day 5 from the  emergency repair of type A aortic dissection and CABG. EXAM: PORTABLE CHEST 1 VIEW COMPARISON:  02/27/2019 and earlier. FINDINGS: Portable AP semi upright view at 0537 hours. The medial right chest tube has been removed. No pneumothorax. Right IJ approach introducer sheath has been removed. Right upper extremity approach PICC line remains in place. Stable somewhat low lung volumes. Stable cardiac size and mediastinal contours. No pulmonary edema, pleural effusion or confluent pulmonary opacity. Visualized tracheal air column is within normal limits. Paucity bowel gas in the upper abdomen. IMPRESSION: 1. Right chest tube removed. No pneumothorax. 2. Low lung volumes. No acute cardiopulmonary abnormality. Electronically Signed   By: Genevie Ann M.D.   On: 02/28/2019 06:32     Medications:     Scheduled Medications: . sodium chloride   Intravenous Once  . allopurinol  100 mg Oral q morning - 10a  . amiodarone  400 mg Oral Q12H  . aspirin EC  325 mg Oral Daily   Or  . aspirin  324 mg Per Tube Daily  . atorvastatin  40 mg Oral q1800  . bisacodyl  10 mg Oral Daily   Or  . bisacodyl  10 mg Rectal Daily  . budesonide (PULMICORT) nebulizer solution  0.5 mg Nebulization BID  . carvedilol  12.5 mg Oral BID WC  . Chlorhexidine Gluconate Cloth  6 each Topical Daily  . docusate sodium  200 mg Oral Daily  . doxycycline  100 mg Oral Q12H  . enoxaparin (LOVENOX) injection  40 mg Subcutaneous Q24H  . feeding supplement (ENSURE ENLIVE)  237 mL Oral BID BM  . ferrous [id number] C-folic acid  1 capsule Oral TID PC  . insulin aspart  0-15 Units Subcutaneous TID WC  . insulin aspart  0-5 Units Subcutaneous QHS  . insulin detemir  14 Units Subcutaneous BID  . losartan  50 mg Oral Daily  . mouth rinse  15 mL Mouth Rinse BID  . multivitamin with minerals  1 tablet Oral Daily  . mupirocin ointment   Nasal BID  . pantoprazole  40 mg Oral Daily  . potassium chloride  40 mEq Oral BID  . sodium chloride  flush  10-40 mL Intracatheter Q12H  . sodium chloride flush  3 mL Intravenous Q12H  . sodium chloride flush  3 mL Intravenous Q12H  . traZODone  100 mg Oral QHS    Infusions: . sodium chloride    . sodium chloride    . sodium chloride 20 mL/hr at 02/24/19 0500  . milrinone 0.125 mcg/kg/min (03/01/19 0959)    PRN Medications: sodium chloride, acetaminophen, ipratropium-albuterol, ondansetron (ZOFRAN) IV, oxyCODONE, sodium chloride flush, sodium chloride flush, sodium chloride flush, traMADol   Studies:    Cardiac cath-PCI 02/23/2019: dLM-ostLAD 45%. pCx 30%, pLAD 30%.  pRCA 90% --> 2 overlapping SYNERGY DES 2.5X16 & 2.75 x 12 --> second stent placed due to iatrogenic aortic root dissection thought to be related to guide catheter trauma. -->  Taken for urgent dissection repair.      Assessment:   58 y/o smoker with DM2 admitted with [place]. Found to have 90% ostial RCA lesion which was successfully stented. Unfortunately case c/b Type A aortic dissection requiring emergent surgical repair on 02/23/19  Plan/Discussion:    1. [place] with CAD s/p stenting of high-grade ostial RCA on 02/23/19 followed by CABG with SVG -> RCA  Continue aspirin, statin and carvedilol.  Added ARB yesterday.  Tolerating well.  He did have a DES stent placed.  --We will defer to Dr. Prescott Gum, but with stent, would like to consider change from aspirin  to Plavix prior to discharge   Intra-op TEE EF 60-65%  -transthoracic echo not ordered.  Converted from Lasix drip to twice daily Lasix over the weekend, now off of all diuretic.  Continue to wean milrinone per CVTS.  2. Type I aortic dissection  s/p repair 2/26  3. Post-op AF/AFL -unsuccessful rapid atrial pacing leading to VT episodes.  Reloaded with IV amiodarone and now back on oral dose.  Plan will be 400 twice daily for 1 week then 200 mg twice daily for 1 week followed by 200 mg daily.  Per Dr. Servando Snare, would not want DOAC post surgery.,  Could  consider using aspirin plus Plavix given recent stent.  4. COPD  Smoking cessation counseling  On vancomycin for MSSA in sputum.  5. DM2  Consider Jardiance on discharge.  Started on home dose losartan.  Continue sliding scale insulin.  6. Hypokalemia -followed by CVTS.  Supplementation ordered.   7. Shoulder pain -suspected to be MSK related.  Unlikely be ischemic.  PT recommended.  8.  Essential hypertension -on carvedilol, added ARB today.   We will follow along peripherally for now as he remained stable.  We will be available if questions arise.  We will continue to check in on him, but would not likely continue rounding notes unless there are new recommendations.   Length of Stay: 6    Glenetta Hew MD 03/01/2019, 2:26 PM   Please contact [facility] for night-coverage after hours (4p -7a ) and weekends on [email or web address]

## 2019-03-02 LAB — CBC
HCT: 25.9 % — ABNORMAL LOW (ref 39.0–52.0)
Hemoglobin: 8.3 g/dL — ABNORMAL LOW (ref 13.0–17.0)
MCH: 29.3 pg (ref 26.0–34.0)
MCHC: 32 g/dL (ref 30.0–36.0)
MCV: 91.5 fL (ref 80.0–100.0)
Platelets: 222 10*3/uL (ref 150–400)
RBC: 2.83 MIL/uL — ABNORMAL LOW (ref 4.22–5.81)
RDW: 14.4 % (ref 11.5–15.5)
WBC: 11.9 10*3/uL — ABNORMAL HIGH (ref 4.0–10.5)
nRBC: 0.3 % — ABNORMAL HIGH (ref 0.0–0.2)

## 2019-03-02 LAB — BASIC METABOLIC PANEL
Anion gap: 10 (ref 5–15)
BUN: 21 mg/dL — AB (ref 6–20)
CO2: 27 mmol/L (ref 22–32)
Calcium: 8.5 mg/dL — ABNORMAL LOW (ref 8.9–10.3)
Chloride: 98 mmol/L (ref 98–111)
Creatinine, Ser: 0.91 mg/dL (ref 0.61–1.24)
GFR calc Af Amer: >60 mL/min (ref >60)
GFR calc non Af Amer: >60 mL/min (ref >60)
GLUCOSE: 109 mg/dL — AB (ref 70–99)
POTASSIUM: 3.6 mmol/L (ref 3.5–5.1)
Sodium: 135 mmol/L (ref 135–145)

## 2019-03-02 LAB — COOXEMETRY PANEL
Carboxyhemoglobin: 2.1 % — ABNORMAL HIGH (ref 0.5–1.5)
METHEMOGLOBIN: 1.7 % — AB (ref 0.0–1.5)
O2 Saturation: 54.9 %
Total hemoglobin: 9.1 g/dL — ABNORMAL LOW (ref 12.0–16.0)

## 2019-03-02 LAB — GLUCOSE, CAPILLARY
Glucose-Capillary: 112 mg/dL — ABNORMAL HIGH (ref 70–99)
Glucose-Capillary: 104 mg/dL — ABNORMAL HIGH (ref 70–99)
Glucose-Capillary: 108 mg/dL — ABNORMAL HIGH (ref 70–99)
Glucose-Capillary: 109 mg/dL — ABNORMAL HIGH (ref 70–99)
Glucose-Capillary: 98 mg/dL (ref 70–99)

## 2019-03-02 MED ORDER — LOSARTAN POTASSIUM 50 MG PO TABS
100.0000 mg | ORAL_TABLET | Freq: Every day | ORAL | Status: DC
  Administered 2019-03-02 – 2019-03-05 (×4): 100 mg via ORAL
  Filled 2019-03-02 (×4): qty 2

## 2019-03-02 MED ORDER — ALTEPLASE 2 MG IJ SOLR
2.0000 mg | Freq: Once | INTRAMUSCULAR | Status: AC
  Administered 2019-03-02: 2 mg
  Filled 2019-03-02: qty 2

## 2019-03-02 MED ORDER — INSULIN DETEMIR 100 UNIT/ML ~~LOC~~ SOLN
8.0000 [IU] | Freq: Two times a day (BID) | SUBCUTANEOUS | Status: DC
  Administered 2019-03-02: 8 [IU] via SUBCUTANEOUS
  Filled 2019-03-02 (×3): qty 0.08

## 2019-03-02 MED ORDER — FUROSEMIDE 10 MG/ML IJ SOLN
40.0000 mg | Freq: Every day | INTRAMUSCULAR | Status: DC
  Administered 2019-03-02 – 2019-03-03 (×2): 40 mg via INTRAVENOUS
  Filled 2019-03-02 (×2): qty 4

## 2019-03-02 NOTE — Progress Notes (Signed)
[facility] Rounding Note   Subjective:    POD # 7 Repair of Type A aortic dissection and CABG with SVG to RCA  REPAIR OF TYPE A  - ACUTE ASCENDING THORACIC AORTIC DISSECTION, using Hemashield Platinum Woven Double Velour Vascular Graft (D: 42m, L: 30cm) (N/A) CORONARY ARTERY BYPASS GRAFTING (CABG) x 1; Using Endoscopically Harvested Right Leg Greater Saphenous Vein Graft (SVG); SVG to RCA (N/A)  Changed back from IV to p.o. amiodarone yesterday.  Milrinone dose weaned down. Dr. VPrescott Gumprefers not to do oral anticoagulation, therefore continue Lovenox.  Feels fine today, just little bit depressed still being in the ICU.  Recalls that his sister died in his ICU 2 months ago and it is very depressing for him to be here.  Is ready to move out of ICU.   Objective:   Weight Range:  Vital Signs:   Temp:  [98 F (36.7 C)-99.9 F (37.7 C)] 99.3 F (37.4 C) (03/04 0749) Pulse Rate:  [81-117] 92 (03/04 0800) Resp:  [16-30] 21 (03/04 0800) BP: (108-175)/(58-88) 130/84 (03/04 0800) SpO2:  [91 %-100 %] 96 % (03/04 0800) Weight:  [109.2 kg] 109.2 kg (03/04 0500) Last BM Date: 03/01/19  Weight change: Filed Weights   02/28/19 0500 03/01/19 0500 03/02/19 0500  Weight: 111.5 kg 110.7 kg 109.2 kg    Intake/Output:   Intake/Output Summary (Last 24 hours) at 03/02/2019 0919 Last data filed at 03/02/2019 0600 Gross per 24 hour  Intake 553.26 ml  Output 2025 ml  Net -1471.74 ml     Physical Exam  Constitutional: He is oriented to person, place, and time. He appears well-developed and well-nourished. No distress.  Healthy-appearing  HENT:  Head: Normocephalic and atraumatic.  Cardiovascular: Normal rate and regular rhythm.  No extrasystoles are present. PMI is not displaced. Exam reveals distant heart sounds, friction rub and decreased pulses (Mildly decreased pedal pulses bilaterally). Exam reveals no gallop.  No murmur heard. Pulmonary/Chest: Effort normal and breath sounds  normal. No respiratory distress. He has no wheezes. He has no rales. He exhibits tenderness (Postop).  Sternal wound well-healed; mild coarse breath sounds, but no rales or rhonchi.  Musculoskeletal: Normal range of motion.        General: No edema (Trivial bilateral).     Comments: Decreased range of motion of left shoulder.  Decreased handgrip right worse than left.  Neurological: He is alert and oriented to person, place, and time.  Psychiatric: He has a normal mood and affect. His behavior is normal. Judgment and thought content normal.  Wants to move out of ICU.  Is somewhat depressed here.  Vitals reviewed.  Telemetry: --Sinus rhythm in the 70s 80s No new EKG  Labs: Basic Metabolic Panel: Recent Labs  Lab 02/24/19 0500 02/24/19 1022  02/25/19 0421  02/27/19 0501 02/28/19 0344 02/28/19 1515 03/01/19 0334 03/01/19 1549 03/02/19 0609  NA 142 144   < >  --    < > 137 133* 133* 131* 134* 135  K 4.1 3.8   < >  --    < > 3.1* 3.1* 3.1* 2.7* 3.6 3.6  CL 110 113*  --   --    < > 93* 89* 89* 90* 95* 98  CO2 24 24  --   --    < > 31 34* 31 29 30 27   GLUCOSE 117* 109*  --   --    < > 117* 129* 123* 160* 120* 109*  BUN 14 18  --   --    < >  20 19 29* 33* 27* 21*  CREATININE 0.91 1.03  --   --    < > 0.89 0.74 1.42* 1.11 1.04 0.91  CALCIUM 7.1* 7.4*  --   --    < > 8.7* 8.4* 8.0* 7.9* 8.4* 8.5*  MG 1.7 2.3  --  2.2  --  1.9  --   --  2.2  --   --    < > = values in this interval not displayed.    Liver Function Tests: Recent Labs  Lab 02/25/19 0500 02/26/19 0206 02/27/19 0501 02/28/19 0344 03/01/19 0334  AST 100* 70* 67* 60* 38  ALT 42 41 46* 59* 42  ALKPHOS 28* 40 75 122 104  BILITOT 0.6 0.8 0.8 0.8 1.1  PROT 4.2* 4.7* 5.6* 5.5* 5.1*  ALBUMIN 2.4* 2.4* 2.6* 2.4* 2.2*   No results for input(s): LIPASE, AMYLASE in the last 168 hours. No results for input(s): AMMONIA in the last 168 hours.  CBC: Recent Labs  Lab 02/26/19 0206  02/26/19 1607 02/27/19 0501  02/28/19 0344 03/01/19 0334 03/02/19 0609  WBC 10.7*  --   --  14.1* 11.2* 10.9* 11.9*  HGB 9.1*   < > 9.2* 9.3* 8.4* 7.7* 8.3*  HCT 27.7*   < > 27.0* 28.8* 26.3* 23.9* 25.9*  MCV 87.9  --   --  89.2 89.8 90.2 91.5  PLT 67*  --   --  120* 124* 138* 222   < > = values in this interval not displayed.    Cardiac Enzymes: Recent Labs  Lab 02/23/19 1314 02/27/19 0024 02/27/19 0501 02/27/19 1304  TROPONINI <0.03 1.95* 1.59* 1.28*    BNP: BNP (last 3 results) Recent Labs    02/22/19 1230  BNP 50.0    ProBNP (last 3 results) No results for input(s): PROBNP in the last 8760 hours.    Other results:  Imaging: Dg Chest Port 1 View  Result Date: 03/01/2019 CLINICAL DATA:  Status post CABG EXAM: PORTABLE CHEST 1 VIEW COMPARISON:  Chest radiograph from one day prior. FINDINGS: Right PICC terminates at the cavoatrial junction. Skin staples and surgical clips overlie the right axilla. Intact sternotomy wires. Stable cardiomediastinal silhouette with mild cardiomegaly. No pneumothorax. No pleural effusion. Cephalization of the pulmonary vasculature without overt pulmonary edema. No acute consolidative airspace disease. IMPRESSION: Stable mild cardiomegaly without overt pulmonary edema. Electronically Signed   By: Ilona Sorrel M.D.   On: 03/01/2019 09:23     Medications:     Scheduled Medications: . sodium chloride   Intravenous Once  . allopurinol  100 mg Oral q morning - 10a  . amiodarone  400 mg Oral Q12H  . aspirin EC  325 mg Oral Daily   Or  . aspirin  324 mg Per Tube Daily  . atorvastatin  40 mg Oral q1800  . bisacodyl  10 mg Oral Daily   Or  . bisacodyl  10 mg Rectal Daily  . budesonide (PULMICORT) nebulizer solution  0.5 mg Nebulization BID  . carvedilol  12.5 mg Oral BID WC  . Chlorhexidine Gluconate Cloth  6 each Topical Daily  . docusate sodium  200 mg Oral Daily  . doxycycline  100 mg Oral Q12H  . enoxaparin (LOVENOX) injection  40 mg Subcutaneous Q24H  .  feeding supplement (ENSURE ENLIVE)  237 mL Oral BID BM  . ferrous [id number] C-folic acid  1 capsule Oral TID PC  . furosemide  40 mg Intravenous Daily  . insulin aspart  0-15 Units Subcutaneous TID WC  . insulin aspart  0-5 Units Subcutaneous QHS  . insulin detemir  8 Units Subcutaneous BID  . losartan  100 mg Oral Daily  . mouth rinse  15 mL Mouth Rinse BID  . multivitamin with minerals  1 tablet Oral Daily  . mupirocin ointment   Nasal BID  . pantoprazole  40 mg Oral Daily  . potassium chloride  40 mEq Oral BID  . sodium chloride flush  10-40 mL Intracatheter Q12H  . sodium chloride flush  3 mL Intravenous Q12H  . sodium chloride flush  3 mL Intravenous Q12H  . traZODone  100 mg Oral QHS    Infusions: . sodium chloride    . sodium chloride    . sodium chloride 10 mL/hr at 03/02/19 0531    PRN Medications: sodium chloride, acetaminophen, ipratropium-albuterol, ondansetron (ZOFRAN) IV, oxyCODONE, sodium chloride flush, sodium chloride flush, sodium chloride flush, traMADol   Studies:    Cardiac cath-PCI 02/23/2019: dLM-ostLAD 45%. pCx 30%, pLAD 30%.  pRCA 90% --> 2 overlapping SYNERGY DES 2.5X16 & 2.75 x 12 --> second stent placed due to iatrogenic aortic root dissection thought to be related to guide catheter trauma. -->  Taken for urgent dissection repair.      Assessment:   58 y/o smoker with DM2 admitted with [place]. Found to have 90% ostial RCA lesion which was successfully stented. Unfortunately case c/b Type A aortic dissection requiring emergent surgical repair on 02/23/19  Plan/Discussion:    1. [place] with CAD s/p stenting of high-grade ostial RCA on 02/23/19 followed by CABG with SVG -> RCA  Continue aspirin, statin and carvedilol.  Added ARB yesterday -dose titrated up today. He did have a DES stent placed.  -->  Defer initiation timing of restarting Plavix to Dr. Prescott Gum.  Hopefully prior to discharge.    Intra-op TEE EF 60-65%  -transthoracic echo not  ordered -can recheck as outpatient  Euvolemic now on no diuretic  Plan to turn off milrinone today.  Monitor and transfer to telemetry  2. Type I aortic dissection  s/p repair 2/26  3. Post-op AF/AFL -unsuccessful rapid atrial pacing leading to VT episodes.  Back on oral amiodarone 400 mg twice daily:  Plan will be 400 twice daily for 1 week then 200 mg twice daily for 1 week followed by 200 mg daily.  Per Dr. Servando Snare, would not want DOAC post surgery. -Should be okay while on amiodarone.  If started back on Plavix, will have some protection with aspirin plus Plavix.  4. COPD  Smoking cessation counseling  On vancomycin for MSSA in sputum -on oral antibiotics  5. DM2  Consider Jardiance on discharge.Continue sliding scale insulin.  ARB restarted.  Dose titrated up    6. Hypokalemia -followed by CVTS.  Supplementation ordered.   7. Shoulder pain -suspected to be MSK related.  Unlikely be ischemic.  PT recommended.  8.  Essential hypertension -on carvedilol, added ARB today.   We will follow along peripherally for now as he remained stable.  We will be available if questions arise.  We will continue to check in on him, but would not likely continue rounding notes unless there are new recommendations.  No new cardiac recommendations at this time.  Will follow along pending discharge.  Length of Stay: 7    Glenetta Hew MD 03/02/2019, 9:19 AM   Please contact [facility] for night-coverage after hours (4p -7a ) and weekends on [email or web address]

## 2019-03-02 NOTE — Progress Notes (Signed)
Physical Therapy Treatment Patient Details Name: [name] [name] MRN: [id number] DOB: 1961-12-28 Today's Date: 03/02/2019    History of Present Illness Pt adm with chest pain from [facility]. Pt underwent cardiac cath with stent placement. 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 Underwent emergent repair of aortic dissection and CABG x 1 on 2/26. PMH - CAD, gout, copd, CVA, back surgery    PT Comments    Pt making good progress.    Follow Up Recommendations  Supervision - Intermittent;No PT follow up     Equipment Recommendations  Other (comment)(rollator)    Recommendations for Other Services       Precautions / Restrictions Precautions Precautions: Sternal    Mobility  Bed Mobility               General bed mobility comments: Pt up in chair  Transfers Overall transfer level: Needs assistance Equipment used: 4-wheeled walker Transfers: Sit to/from Stand;Stand Pivot Transfers Sit to Stand: Supervision         General transfer comment: Assist for safety and lines. Verbal cues for sternal precautions. Chair to bsc with min assist for balance  Ambulation/Gait Ambulation/Gait assistance: Supervision Gait Distance (Feet): 300 Feet Assistive device: 4-wheeled walker Gait Pattern/deviations: Step-through pattern;Decreased stride length Gait velocity: decr Gait velocity interpretation: >2.62 ft/sec, indicative of community ambulatory General Gait Details: Assist for safety. Pt amb on RA with HR 103 and dyspnea 2/4   Stairs             Wheelchair Mobility    Modified Rankin (Stroke Patients Only)       Balance Overall balance assessment: Needs assistance Sitting-balance support: No upper extremity supported;Feet supported Sitting balance-Leahy Scale: Good     Standing balance support: No upper extremity supported Standing balance-Leahy Scale: Fair                              Cognition Arousal/Alertness:  Awake/alert Behavior During Therapy: WFL for tasks assessed/performed Overall Cognitive Status: Within Functional Limits for tasks assessed                                        Exercises      General Comments        Pertinent Vitals/Pain Pain Assessment: Faces Faces Pain Scale: No hurt    Home Living                      Prior Function            PT Goals (current goals can now be found in the care plan section) Progress towards PT goals: Progressing toward goals    Frequency    Min 3X/week      PT Plan Current plan remains appropriate    Co-evaluation              AM-PAC PT "6 Clicks" Mobility   Outcome Measure                   End of Session   Activity Tolerance: Patient tolerated treatment well Patient left: in chair   PT Visit Diagnosis: Unsteadiness on feet (R26.81);Other abnormalities of gait and mobility (R26.89);Muscle weakness (generalized) (M62.81)     Time: 1660-6301 PT Time Calculation (min) (ACUTE ONLY): 8 min  Charges:  $Gait Training: 8-22 mins  [place] Pager [phone] Office [place] 03/02/2019, 10:28 AM

## 2019-03-02 NOTE — Progress Notes (Signed)
Pt transferred from [place], on arrival he was alert and oriented, no acute distress, CCMD called with 2nd person verified. CHG bath given. Vital sings stable, Sinus rhythm on monitor. Equipments and room instructions given. Call bell with in reach. Continue to monitor.  [name] [name], BSN,RN,PCCN-CMC

## 2019-03-02 NOTE — Progress Notes (Addendum)
7 Days Post-Op Procedure(s) (LRB): REPAIR OF TYPE A  - ACUTE ASCENDING THORACIC AORTIC DISSECTION, using Hemashield Platinum Woven Double Velour Vascular Graft (D: 22m, L: 30cm) (N/A) CORONARY ARTERY BYPASS GRAFTING (CABG) x 1; Using Endoscopically Harvested Right Leg Greater Saphenous Vein Graft (SVG); SVG to RCA (N/A) TRANSESOPHAGEAL ECHOCARDIOGRAM (TEE) (N/A) Subjective: Mr. PAbernathyexpressed anxiety regarding his length of stay in the ICU.  An episode of depression and crying yesterday afternoon.  He denies shortness of breath on room air while at rest.  Pain control is adequate.  IV amiodarone is off.  Milrinone continues at 0125 mics/Kg/min. Co-Ox 54.9.  Objective: Vital signs in last 24 hours: Temp:  [98 F (36.7 C)-99.9 F (37.7 C)] 99.3 F (37.4 C) (03/04 0749) Pulse Rate:  [81-117] 88 (03/04 0710) Cardiac Rhythm: Normal sinus rhythm (03/04 0400) Resp:  [16-30] 20 (03/04 0710) BP: (108-175)/(54-88) 151/67 (03/04 0550) SpO2:  [91 %-100 %] 100 % (03/04 0710) Weight:  [109.2 kg] 109.2 kg (03/04 0500)  Hemodynamic parameters for last 24 hours: CVP:  [9 mmHg-12 mmHg] 9 mmHg  Intake/Output from previous day: 03/03 0701 - 03/04 0700 In: 627.4 [P.O.:360; I.V.:212.2; IV Piggyback:55.2] Out: 2025 [Urine:2025] Intake/Output this shift: No intake/output data recorded.  General appearance: alert, cooperative but anxious. Heart: He has been in a stable SR past 24 hours.  Lungs: clear to auscultation bilaterally. He is on RA with acceptable O2 sats. Extremities: Mild peripheral edema Wound: The sternotomy and right chest incisions are dry. Expected bruising right thigh, Thigh incision dry and well approximated.  Lab Results: Recent Labs    03/01/19 0334 03/02/19 0609  WBC 10.9* 11.9*  HGB 7.7* 8.3*  HCT 23.9* 25.9*  PLT 138* 222   BMET:  Recent Labs    03/01/19 1549 03/02/19 0609  NA 134* 135  K 3.6 3.6  CL 95* 98  CO2 30 27  GLUCOSE 120* 109*  BUN 27* 21*   CREATININE 1.04 0.91  CALCIUM 8.4* 8.5*    PT/INR: No results for input(s): LABPROT, INR in the last 72 hours. ABG    Component Value Date/Time   PHART 7.541 (H) 02/26/2019 1607   HCO3 31.5 (H) 02/26/2019 1607   TCO2 33 (H) 02/26/2019 1607   ACIDBASEDEF 3.0 (H) 02/24/2019 0155   O2SAT 54.9 03/02/2019 0538   CBG (last 3)  Recent Labs    03/01/19 1548 03/01/19 2114 03/02/19 0746  GLUCAP 105* 102* 112*    Assessment/Plan: S/P Procedure(s) (LRB): REPAIR OF TYPE A  - ACUTE ASCENDING THORACIC AORTIC DISSECTION, using Hemashield Platinum Woven Double Velour Vascular Graft (D: 251m L: 30cm) (N/A) CORONARY ARTERY BYPASS GRAFTING (CABG) x 1; Using Endoscopically Harvested Right Leg Greater Saphenous Vein Graft (SVG); SVG to RCA (N/A) TRANSESOPHAGEAL ECHOCARDIOGRAM (TEE) (N/A)  -Postop day 7 emergency repair of acute type pain aortic dissection and single-vessel coronary bypass grafting.  Stable hemodynamics but remains on low-dose IV milrinone. Co-Ox 54.9 this am.  Will discuss management with Dr. VaPrescott Gum Continue diuresis.  -Postop atrial fibrillation initially managed with IV amiodarone.  He has been transitioned to oral amiodarone 400 mg twice daily and has maintained sinus rhythm for the past 24 hours.  -Hypokalemia-corrected.  Continue to monitor closely while on Lasix  -Hypertension.  Blood pressure controlled with carvedilol and losartan  -DVT prophylaxis-continue subcu enoxaparin, mobilize.  -Repeat lab and CXR in AM.   LOS: 7 days    MyAntony OdeaPA-C [phone]/03/2019  Patient doing well Wean off milrinone  and tx to 4E Co-ox will improve as postop anemia improves Cont iv lasix Q am  patient examined and medical record reviewed,agree with above note. Tharon Aquas Trigt III 03/02/2019

## 2019-03-02 NOTE — Progress Notes (Signed)
Occupational Therapy Treatment Patient Details Name: [name] [name] MRN: [id number] DOB: March 22, 1961 Today's Date: 03/02/2019    History of present illness Pt adm with chest pain from [facility]. Pt underwent cardiac cath with stent placement. 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 Underwent emergent repair of aortic dissection and CABG x 1 on 2/26. PMH - CAD, gout, copd, CVA, back surgery   OT comments  Spoke with Dr. Prescott Gum regarding BUE. Pt now states that his L shoulder has "been like this". Pt states "I'm sorry I wasn't thinking last night when I told you it was different". Pt with apparent L RTC insufficiency. R hand looks improved as compared to last night. Pt with minimal increase in R MP composite flexion and digit add/abduction. Able to use AE to  feed self after set up. Began education regarding compensatory techniques for ADL using "moving in the tube" information.  Pt told this therapist he is unable to read. Nsg notified. Will continue to follow acutely to facilitate safe DC home and increase functional use of RUE.   Follow Up Recommendations  Outpatient OT;Supervision - Intermittent    Equipment Recommendations  None recommended by OT    Recommendations for Other Services      Precautions / Restrictions Precautions Precautions: Sternal       Mobility Bed Mobility               General bed mobility comments: Pt up in chair    Balance                           ADL either performed or assessed with clinical judgement   ADL                       Lower Body Dressing: Minimal assistance(able to donn/doff socks)                 General ADL Comments: Began education regarding compensatory strategies for ADL. Educated pt on "moving in the tube". Pt needs to be modified independent due to limited help from his wife     Vision       Perception     Praxis      Cognition Arousal/Alertness: Awake/alert Behavior During  Therapy: WFL for tasks assessed/performed Overall Cognitive Status: Within Functional Limits for tasks assessed    requires repetition for correct movement patterns                                      Exercises Other Exercises Other Exercises: R hand tendon gliding ex Other Exercises: place adnhold in composite flexion Other Exercises: functional grasp/release using wash cloth on table . vc to "quiet" wrist extension and shoulder abduction as pt is compensating   Shoulder Instructions       General Comments      Pertinent Vitals/ Pain       Pain Assessment: Faces Faces Pain Scale: Hurts a little bit Pain Location: palpation to anterior shoulder Pain Descriptors / Indicators: Discomfort;Grimacing;Aching Pain Intervention(s): Limited activity within patient's tolerance  Home Living                                          Prior Functioning/Environment  Frequency  Min 3X/week        Progress Toward Goals  OT Goals(current goals can now be found in the care plan section)  Progress towards OT goals: Progressing toward goals  Acute Rehab OT Goals Patient Stated Goal: to be able to use my hands and arm again  OT Goal Formulation: With patient Time For Goal Achievement: 03/15/19 Potential to Achieve Goals: Good ADL Goals Pt Will Perform Upper Body Bathing: with modified independence;sitting Pt Will Perform Lower Body Bathing: with modified independence;sitting/lateral leans Pt Will Perform Upper Body Dressing: with modified independence;sitting Pt Will Perform Lower Body Dressing: with modified independence;sit to/from stand Pt/caregiver will Perform Home Exercise Program: Right Upper extremity;With written HEP provided;With Supervision Additional ADL Goal #1: Pt will demonstrate adherane to sternal precautions with min vc during ADL task  Plan Discharge plan remains appropriate    Co-evaluation                  AM-PAC OT "6 Clicks" Daily Activity     Outcome Measure   Help from another person eating meals?: A Little Help from another person taking care of personal grooming?: A Little Help from another person toileting, which includes using toliet, bedpan, or urinal?: A Little Help from another person bathing (including washing, rinsing, drying)?: A Little Help from another person to put on and taking off regular upper body clothing?: A Little Help from another person to put on and taking off regular lower body clothing?: A Little 6 Click Score: 18    End of Session    OT Visit Diagnosis: Unsteadiness on feet (R26.81);Muscle weakness (generalized) (M62.81);Pain Pain - Right/Left: Left Pain - part of body: Shoulder(with palpation)   Activity Tolerance Patient tolerated treatment well   Patient Left in chair;with call bell/phone within reach   Nurse Communication Mobility status;Other (comment)(progress o fBUE)        Time: 3888-2800 OT Time Calculation (min): 22 min  Charges: OT General Charges $OT Visit: 1 Visit OT Treatments $Therapeutic Activity: 8-22 mins  Maurie Boettcher, OT/L   Acute OT Clinical Specialist [place] Pager [phone] Office [phone]    [facility] 03/02/2019, 11:46 AM

## 2019-03-03 ENCOUNTER — Inpatient Hospital Stay (HOSPITAL_COMMUNITY): Payer: Medicare Other

## 2019-03-03 LAB — BASIC METABOLIC PANEL
Anion gap: 11 (ref 5–15)
BUN: 21 mg/dL — ABNORMAL HIGH (ref 6–20)
CO2: 25 mmol/L (ref 22–32)
Calcium: 8.8 mg/dL — ABNORMAL LOW (ref 8.9–10.3)
Chloride: 100 mmol/L (ref 98–111)
Creatinine, Ser: 0.96 mg/dL (ref 0.61–1.24)
GFR calc Af Amer: >60 mL/min (ref >60)
GFR calc non Af Amer: >60 mL/min (ref >60)
GLUCOSE: 112 mg/dL — AB (ref 70–99)
Potassium: 4.3 mmol/L (ref 3.5–5.1)
Sodium: 136 mmol/L (ref 135–145)

## 2019-03-03 LAB — CBC
HCT: 26.4 % — ABNORMAL LOW (ref 39.0–52.0)
Hemoglobin: 8.6 g/dL — ABNORMAL LOW (ref 13.0–17.0)
MCH: 29.6 pg (ref 26.0–34.0)
MCHC: 32.6 g/dL (ref 30.0–36.0)
MCV: 90.7 fL (ref 80.0–100.0)
Platelets: 288 10*3/uL (ref 150–400)
RBC: 2.91 MIL/uL — ABNORMAL LOW (ref 4.22–5.81)
RDW: 14.4 % (ref 11.5–15.5)
WBC: 12.9 10*3/uL — ABNORMAL HIGH (ref 4.0–10.5)
nRBC: 0.3 % — ABNORMAL HIGH (ref 0.0–0.2)

## 2019-03-03 LAB — GLUCOSE, CAPILLARY
Glucose-Capillary: 105 mg/dL — ABNORMAL HIGH (ref 70–99)
Glucose-Capillary: 128 mg/dL — ABNORMAL HIGH (ref 70–99)
Glucose-Capillary: 120 mg/dL — ABNORMAL HIGH (ref 70–99)
Glucose-Capillary: 114 mg/dL — ABNORMAL HIGH (ref 70–99)

## 2019-03-03 MED ORDER — CARVEDILOL 6.25 MG PO TABS
18.7500 mg | ORAL_TABLET | Freq: Two times a day (BID) | ORAL | Status: DC
  Administered 2019-03-03 – 2019-03-04 (×2): 18.75 mg via ORAL
  Filled 2019-03-03 (×2): qty 1

## 2019-03-03 MED ORDER — FUROSEMIDE 40 MG PO TABS
40.0000 mg | ORAL_TABLET | Freq: Every day | ORAL | Status: DC
  Administered 2019-03-04 – 2019-03-05 (×2): 40 mg via ORAL
  Filled 2019-03-03 (×2): qty 1

## 2019-03-03 MED ORDER — POTASSIUM CHLORIDE CRYS ER 20 MEQ PO TBCR
40.0000 meq | EXTENDED_RELEASE_TABLET | Freq: Every day | ORAL | Status: DC
  Administered 2019-03-04 – 2019-03-05 (×2): 40 meq via ORAL
  Filled 2019-03-03 (×2): qty 2

## 2019-03-03 MED ORDER — AMLODIPINE BESYLATE 10 MG PO TABS
10.0000 mg | ORAL_TABLET | Freq: Every day | ORAL | Status: DC
  Administered 2019-03-03 – 2019-03-05 (×3): 10 mg via ORAL
  Filled 2019-03-03 (×3): qty 1

## 2019-03-03 NOTE — Progress Notes (Signed)
CARDIAC REHAB PHASE I   Offered to walk with pt, pt states he just finished walking. Pt seen ambulating independently earlier today with front wheel walker. Max HR 100. Encouraged continued IS use, walks, and sternal precautions with pt. Will continue to follow.  [id number] Rufina Falco, RN BSN 03/03/2019 2:31 PM

## 2019-03-03 NOTE — Progress Notes (Addendum)
      [address]       [place] [address]             [phone]        8 Days Post-Op Procedure(s) (LRB): REPAIR OF TYPE A  - ACUTE ASCENDING THORACIC AORTIC DISSECTION, using Hemashield Platinum Woven Double Velour Vascular Graft (D: 50m, L: 30cm) (N/A) CORONARY ARTERY BYPASS GRAFTING (CABG) x 1; Using Endoscopically Harvested Right Leg Greater Saphenous Vein Graft (SVG); SVG to RCA (N/A) TRANSESOPHAGEAL ECHOCARDIOGRAM (TEE) (N/A)  Subjective: Patient more cheerful today. He states he was told he can go home Saturday, which he is looking forward to. He has no specific complaints this am.  Objective: Vital signs in last 24 hours: Temp:  [98.2 F (36.8 C)-100.2 F (37.9 C)] 98.9 F (37.2 C) (03/05 0807) Pulse Rate:  [84-92] 87 (03/05 0807) Cardiac Rhythm: Normal sinus rhythm;Bundle branch block (03/04 2000) Resp:  [20-31] 20 (03/05 0807) BP: (139-160)/(64-93) 160/70 (03/05 0807) SpO2:  [94 %-99 %] 99 % (03/05 0856) Weight:  [107.6 kg] 107.6 kg (03/05 0353)  Pre op weight 110 kg Current Weight  03/03/19 107.6 kg      Intake/Output from previous day: 03/04 0701 - 03/05 0700 In: 674[P.O.:610] Out: 750 [Urine:750]   Physical Exam:  Cardiovascular: RRR, no murmur Pulmonary: Clear to auscultation bilaterally Abdomen: Soft, non tender, bowel sounds present. Extremities: Tracebilateral lower extremity edema. Ecchymosis right thigh. Wounds: Clean and dry.  No erythema or signs of infection.  Lab Results: CBC: Recent Labs    03/02/19 0609 03/03/19 0405  WBC 11.9* 12.9*  HGB 8.3* 8.6*  HCT 25.9* 26.4*  PLT 222 288   BMET:  Recent Labs    03/02/19 0609 03/03/19 0405  NA 135 136  K 3.6 4.3  CL 98 100  CO2 27 25  GLUCOSE 109* 112*  BUN 21* 21*  CREATININE 0.91 0.96  CALCIUM 8.5* 8.8*    PT/INR:  Lab Results  Component Value Date   INR 1.1 02/24/2019   INR 1.1 02/24/2019   INR 1.3 (H) 02/23/2019   ABG:  INR: Will add last  result for INR, ABG once components are confirmed Will add last 4 CBG results once components are confirmed  Assessment/Plan:  1. CV - Previous a fib. Maintaining SR and in SR with HR in the 80's this am. Hypertensive this am. On Amiodarone 400 mg bid, Carvedilol 12.5 mg bid, Losartan 100 mg daily. Will restart Amlodipine 10 mg daily. 2.  Pulmonary - On room air. CXR shows no pneumothorax, minor atelectasis. Continue Pulmicort and Duoneb. Encourage incentive spirometer. 3. Volume Overload - On Lasix 40 mg IV daily. Will change to oral in am as already given IV this am 4.  Acute blood loss anemia - H and H stable at 8.6 and 26.4 5. CBGs 109/98/105. Pre op HGA1C 6. He likely has pre diabetes and will need further follow up with medical doctor after discharge. Stop accu checks and SS PRN 6. Remove EPW 7. Hope to remove every other staple right axilla soon 8. Hopefully, home Saturday (per Dr. VPrescott Gum  [name] [name] M ZimmermanPA-C 03/03/2019,9:40 AM [phone]

## 2019-03-03 NOTE — Care Management Important Message (Signed)
Important Message  Patient Details  Name: [name] [name] MRN: [id number] Date of Birth: 1961-07-26   Medicare Important Message Given:  Yes    [name] 03/03/2019, 1:24 PM

## 2019-03-03 NOTE — Progress Notes (Signed)
   Patient seen and evaluated today.  Chart reviewed.    Seems stable.  Was walking around in the hallway.  Happy to be out of the ICU and excited to hear of a potential discharge date.    Pacing wires were removed today.   No longer on milrinone drip. Maintaining sinus rhythm on oral amiodarone.  Remains on stable regimen.  Would defer decision on potential starting Plavix prior to discharge based on recent stent.  Understandably would not want DOAC.[name] [name]   We will follow along.  Glenetta Hew, MD

## 2019-03-03 NOTE — Discharge Instructions (Signed)
Discharge Instructions:  1. You may shower, please wash incisions daily with soap and water and keep dry.  If you wish to cover wounds with dressing you may do so but please keep clean and change daily.  No tub baths or swimming until incisions have completely healed.  If your incisions become red or develop any drainage please call our office at [phone]  2. No Driving until cleared by Dr. Lucianne Lei Trigt's office and you are no longer using narcotic pain medications  3. Monitor your weight daily.. Please use the same scale and weigh at same time... If you gain 5-10 lbs in 48 hours with associated lower extremity swelling, please contact our office at [phone]  4. Fever of 101.5 for at least 24 hours with no source, please contact our office at [phone]  5. Activity- up as tolerated, please walk at least 3 times per day.  Avoid strenuous activity, no lifting, pushing, or pulling with your arms over 8-10 lbs for a minimum of 6 weeks  6. If any questions or concerns arise, please do not hesitate to contact our office at [phone]  Prediabetes Eating Plan Prediabetes is a condition that causes blood sugar (glucose) levels to be higher than normal. This increases the risk for developing diabetes. In order to prevent diabetes from developing, your health care provider may recommend a diet and other lifestyle changes to help you:  Control your blood glucose levels.  Improve your cholesterol levels.  Manage your blood pressure. Your health care provider may recommend working with a diet and nutrition specialist (dietitian) to make a meal plan that is best for you. What are tips for following this plan? Lifestyle  Set weight loss goals with the help of your health care team. It is recommended that most people with prediabetes lose 7% of their current body weight.  Exercise for at least 30 minutes at least 5 days a week.  Attend a support group or seek ongoing support from a mental  health counselor.  Take over-the-counter and prescription medicines only as told by your health care provider. Reading food labels  Read food labels to check the amount of fat, salt (sodium), and sugar in prepackaged foods. Avoid foods that have: ? Saturated fats. ? Trans fats. ? Added sugars.  Avoid foods that have more than 300 milligrams (mg) of sodium per serving. Limit your daily sodium intake to less than 2,300 mg each day. Shopping  Avoid buying pre-made and processed foods. Cooking  Cook with olive oil. Do not use butter, lard, or ghee.  Bake, broil, grill, or boil foods. Avoid frying. Meal planning   Work with your dietitian to develop an eating plan that is right for you. This may include: ? Tracking how many calories you take in. Use a food diary, notebook, or mobile application to track what you eat at each meal. ? Using the glycemic index (GI) to plan your meals. The index tells you how quickly a food will raise your blood glucose. Choose low-GI foods. These foods take a longer time to raise blood glucose.  Consider following a Mediterranean diet. This diet includes: ? Several servings each day of fresh fruits and vegetables. ? Eating fish at least twice a week. ? Several servings each day of whole grains, beans, nuts, and seeds. ? Using olive oil instead of other fats. ? Moderate alcohol consumption. ? Eating small amounts of red meat and whole-fat dairy.  If you have high blood pressure, you may need to  limit your sodium intake or follow a diet such as the DASH eating plan. DASH is an eating plan that aims to lower high blood pressure. What foods are recommended? The items listed below may not be a complete list. Talk with your dietitian about what dietary choices are best for you. Grains Whole grains, such as whole-wheat or whole-grain breads, crackers, cereals, and pasta. Unsweetened oatmeal. Bulgur. Barley. Quinoa. Brown rice. Corn or whole-wheat flour  tortillas or taco shells. Vegetables Lettuce. Spinach. Peas. Beets. Cauliflower. Cabbage. Broccoli. Carrots. Tomatoes. Squash. Eggplant. Herbs. Peppers. Onions. Cucumbers. Brussels sprouts. Fruits Berries. Bananas. Apples. Oranges. Grapes. Papaya. Mango. Pomegranate. Kiwi. Grapefruit. Cherries. Meats and other protein foods Seafood. Poultry without skin. Lean cuts of pork and beef. Tofu. Eggs. Nuts. Beans. Dairy Low-fat or fat-free dairy products, such as yogurt, cottage cheese, and cheese. Beverages Water. Tea. Coffee. Sugar-free or diet soda. Seltzer water. Lowfat or no-fat milk. Milk alternatives, such as soy or almond milk. Fats and oils Olive oil. Canola oil. Sunflower oil. Grapeseed oil. Avocado. Walnuts. Sweets and desserts Sugar-free or low-fat pudding. Sugar-free or low-fat ice cream and other frozen treats. Seasoning and other foods Herbs. Sodium-free spices. Mustard. Relish. Low-fat, low-sugar ketchup. Low-fat, low-sugar barbecue sauce. Low-fat or fat-free mayonnaise. What foods are not recommended? The items listed below may not be a complete list. Talk with your dietitian about what dietary choices are best for you. Grains Refined white flour and flour products, such as bread, pasta, snack foods, and cereals. Vegetables Canned vegetables. Frozen vegetables with butter or cream sauce. Fruits Fruits canned with syrup. Meats and other protein foods Fatty cuts of meat. Poultry with skin. Breaded or fried meat. Processed meats. Dairy Full-fat yogurt, cheese, or milk. Beverages Sweetened drinks, such as sweet iced tea and soda. Fats and oils Butter. Lard. Ghee. Sweets and desserts Baked goods, such as cake, cupcakes, pastries, cookies, and cheesecake. Seasoning and other foods Spice mixes with added salt. Ketchup. Barbecue sauce. Mayonnaise. Summary  To prevent diabetes from developing, you may need to make diet and other lifestyle changes to help control blood sugar,  improve cholesterol levels, and manage your blood pressure.  Set weight loss goals with the help of your health care team. It is recommended that most people with prediabetes lose 7 percent of their current body weight.  Consider following a Mediterranean diet that includes plenty of fresh fruits and vegetables, whole grains, beans, nuts, seeds, fish, lean meat, low-fat dairy, and healthy oils. This information is not intended to replace advice given to you by your health care provider. Make sure you discuss any questions you have with your health care provider. Document Released: 05/01/2015 Document Revised: 02/18/2017 Document Reviewed: 02/18/2017 Elsevier Interactive Patient Education  2019 [employer].

## 2019-03-03 NOTE — Progress Notes (Signed)
EPW removed per MD orders without difficulty.  Will continue to monitor.

## 2019-03-03 NOTE — Plan of Care (Signed)
  Problem: Education: Goal: Knowledge of General Education information will improve Description Including pain rating scale, medication(s)/side effects and non-pharmacologic comfort measures Outcome: Progressing   Problem: Clinical Measurements: Goal: Diagnostic test results will improve Outcome: Progressing Goal: Respiratory complications will improve Outcome: Progressing   Problem: Coping: Goal: Level of anxiety will decrease Outcome: Progressing

## 2019-03-04 DIAGNOSIS — Z955 Presence of coronary angioplasty implant and graft: Secondary | ICD-10-CM

## 2019-03-04 DIAGNOSIS — I25118 Atherosclerotic heart disease of native coronary artery with other forms of angina pectoris: Secondary | ICD-10-CM

## 2019-03-04 LAB — BASIC METABOLIC PANEL
Anion gap: 8 (ref 5–15)
BUN: 19 mg/dL (ref 6–20)
CO2: 24 mmol/L (ref 22–32)
Calcium: 8.9 mg/dL (ref 8.9–10.3)
Chloride: 103 mmol/L (ref 98–111)
Creatinine, Ser: 1 mg/dL (ref 0.61–1.24)
GFR calc Af Amer: >60 mL/min (ref >60)
GFR calc non Af Amer: >60 mL/min (ref >60)
Glucose, Bld: 109 mg/dL — ABNORMAL HIGH (ref 70–99)
Potassium: 4.3 mmol/L (ref 3.5–5.1)
SODIUM: 135 mmol/L (ref 135–145)

## 2019-03-04 LAB — GLUCOSE, CAPILLARY
Glucose-Capillary: 92 mg/dL (ref 70–99)
Glucose-Capillary: 105 mg/dL — ABNORMAL HIGH (ref 70–99)
Glucose-Capillary: 124 mg/dL — ABNORMAL HIGH (ref 70–99)
Glucose-Capillary: 101 mg/dL — ABNORMAL HIGH (ref 70–99)

## 2019-03-04 LAB — CBC
HCT: 26.5 % — ABNORMAL LOW (ref 39.0–52.0)
Hemoglobin: 8.4 g/dL — ABNORMAL LOW (ref 13.0–17.0)
MCH: 29.5 pg (ref 26.0–34.0)
MCHC: 31.7 g/dL (ref 30.0–36.0)
MCV: 93 fL (ref 80.0–100.0)
Platelets: 329 10*3/uL (ref 150–400)
RBC: 2.85 MIL/uL — ABNORMAL LOW (ref 4.22–5.81)
RDW: 14.3 % (ref 11.5–15.5)
WBC: 12.2 10*3/uL — ABNORMAL HIGH (ref 4.0–10.5)
nRBC: 0.2 % (ref 0.0–0.2)

## 2019-03-04 MED ORDER — CARVEDILOL 25 MG PO TABS
25.0000 mg | ORAL_TABLET | Freq: Two times a day (BID) | ORAL | Status: DC
  Administered 2019-03-04 – 2019-03-05 (×2): 25 mg via ORAL
  Filled 2019-03-04 (×2): qty 1

## 2019-03-04 MED ORDER — ATORVASTATIN CALCIUM 80 MG PO TABS
80.0000 mg | ORAL_TABLET | Freq: Every day | ORAL | Status: DC
  Administered 2019-03-04: 80 mg via ORAL
  Filled 2019-03-04: qty 1

## 2019-03-04 NOTE — Progress Notes (Signed)
CARDIAC REHAB PHASE I   Offered to walk with pt, pt states he recently got back from his second walk with PT. Education completed with pt. Instructed pt on importance of showers and monitoring incisions daily. Reinforced importance of IS, Flutter, sternal precautions and walks. Pt given stent card, in-the-tube sheet, and heart healthy diet. Reviewed restrictions and exercise guidelines. Pt educated on importance of Plavix. Will refer to CRP II [place]. Pt hopeful to d/c tomorrow.  [id number] Rufina Falco, RN BSN 03/04/2019 10:24 AM

## 2019-03-04 NOTE — Progress Notes (Signed)
Physical Therapy Treatment Patient Details Name: [name] [name] MRN: [id number] DOB: 1961/06/30 Today's Date: 03/04/2019    History of Present Illness 58 yo male admitted with chest pain from [facility]. Pt underwent cardiac cath with stent placement. 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 Underwent emergent repair of aortic dissection and CABG x 1 on 2/26. PMH - CAD, gout, copd, CVA, back surgery    PT Comments    Pt is up to walk with supervision, walking with supervision but assisting pt in setting limits as he becomes SOB.  Pt is still using cues from PT to mobilize, and will be going home tomorrow.  Talked with him about shortening walks if he feels he overdid yesterday, and did prompt him to stand in place and catch his breath if he is SOB during walks.  Pt should be able to incorporate the teaching into home practice but is going to remain on PT for now to ensure he is translating training to self care.   Follow Up Recommendations  Supervision - Intermittent;No PT follow up     Equipment Recommendations  Other (comment)(rollator)    Recommendations for Other Services OT consult     Precautions / Restrictions Precautions Precautions: Sternal Restrictions Weight Bearing Restrictions: Yes(sternal precautions)    Mobility  Bed Mobility               General bed mobility comments: up in chair when PT Arrived  Transfers Overall transfer level: Needs assistance Equipment used: Rolling walker (2 wheeled) Transfers: Sit to/from Stand Sit to Stand: Supervision         General transfer comment: pt did with no assist but cued safety on the hall  Ambulation/Gait Ambulation/Gait assistance: Supervision Gait Distance (Feet): 250 Feet Assistive device: Rolling walker (2 wheeled) Gait Pattern/deviations: Step-through pattern;Decreased stride length;Wide base of support Gait velocity: reduced Gait velocity interpretation: <1.31 ft/sec, indicative of household  ambulator General Gait Details: SOB 2/4 with 2 standing rests   Stairs             Wheelchair Mobility    Modified Rankin (Stroke Patients Only)       Balance Overall balance assessment: Needs assistance Sitting-balance support: Feet supported Sitting balance-Leahy Scale: Good     Standing balance support: Bilateral upper extremity supported;During functional activity Standing balance-Leahy Scale: Fair                              Cognition Arousal/Alertness: Awake/alert Behavior During Therapy: WFL for tasks assessed/performed Overall Cognitive Status: Within Functional Limits for tasks assessed                                        Exercises      General Comments        Pertinent Vitals/Pain Pain Assessment: No/denies pain    Home Living                      Prior Function            PT Goals (current goals can now be found in the care plan section) Acute Rehab PT Goals Patient Stated Goal: to be able to get home and feel better Progress towards PT goals: Progressing toward goals    Frequency    Min 3X/week      PT Plan  Current plan remains appropriate    Co-evaluation              AM-PAC PT "6 Clicks" Mobility   Outcome Measure  Help needed turning from your back to your side while in a flat bed without using bedrails?: A Little Help needed moving from lying on your back to sitting on the side of a flat bed without using bedrails?: A Little Help needed moving to and from a bed to a chair (including a wheelchair)?: A Little Help needed standing up from a chair using your arms (e.g., wheelchair or bedside chair)?: A Little Help needed to walk in hospital room?: A Little Help needed climbing 3-5 steps with a railing? : A Little 6 Click Score: 18    End of Session Equipment Utilized During Treatment: Gait belt Activity Tolerance: Patient tolerated treatment well;Treatment limited secondary to  medical complications (Comment)(SOB) Patient left: in chair;with call bell/phone within reach Nurse Communication: Mobility status PT Visit Diagnosis: Unsteadiness on feet (R26.81);Other abnormalities of gait and mobility (R26.89);Muscle weakness (generalized) (M62.81)     Time: 1003-4961 PT Time Calculation (min) (ACUTE ONLY): 23 min  Charges:  $Gait Training: 8-22 mins $Therapeutic Activity: 8-22 mins                  Ramond Dial 03/04/2019, 9:44 AM  Mee Hives, PT MS Acute Rehab Dept. Number: [place] [facility] [place]

## 2019-03-04 NOTE — Progress Notes (Signed)
Every other staple removed from right chest incision, per order.  Pt tolerated very well.  Will con't plan of care.

## 2019-03-04 NOTE — Progress Notes (Addendum)
9 Days Post-Op Procedure(s) (LRB): REPAIR OF TYPE A  - ACUTE ASCENDING THORACIC AORTIC DISSECTION, using Hemashield Platinum Woven Double Velour Vascular Graft (D: 80m, L: 30cm) (N/A) CORONARY ARTERY BYPASS GRAFTING (CABG) x 1; Using Endoscopically Harvested Right Leg Greater Saphenous Vein Graft (SVG); SVG to RCA (N/A) TRANSESOPHAGEAL ECHOCARDIOGRAM (TEE) (N/A) Subjective: Says he feel well and has no new problems to report. Mobility improving.  Maintaining O2 sats on RA.   Objective: Vital signs in last 24 hours: Temp:  [98.3 F (36.8 C)-99 F (37.2 C)] 98.3 F (36.8 C) (03/06 0355) Pulse Rate:  [81-129] 84 (03/06 0758) Cardiac Rhythm: Normal sinus rhythm;Bundle branch block (03/05 1903) Resp:  [15-28] 20 (03/06 0758) BP: (117-143)/(62-74) 143/72 (03/06 0355) SpO2:  [89 %-100 %] 94 % (03/06 0758) Weight:  [106 kg] 106 kg (03/06 0354)  Hemodynamic parameters for last 24 hours:    Intake/Output from previous day: 03/05 0701 - 03/06 0700 In: 600 [P.O.:600] Out: 300 [Urine:300] Intake/Output this shift: No intake/output data recorded.  General appearance: alert, cooperative and no distress Heart: regular rate and rhythm Lungs: Breath sounds are clear. Wound: The right subclavian and sternal incisions are healing with no sign of complication.  Lab Results: Recent Labs    03/03/19 0405 03/04/19 0252  WBC 12.9* 12.2*  HGB 8.6* 8.4*  HCT 26.4* 26.5*  PLT 288 329   BMET:  Recent Labs    03/03/19 0405 03/04/19 0252  NA 136 135  K 4.3 4.3  CL 100 103  CO2 25 24  GLUCOSE 112* 109*  BUN 21* 19  CREATININE 0.96 1.00  CALCIUM 8.8* 8.9    PT/INR: No results for input(s): LABPROT, INR in the last 72 hours. ABG    Component Value Date/Time   PHART 7.541 (H) 02/26/2019 1607   HCO3 31.5 (H) 02/26/2019 1607   TCO2 33 (H) 02/26/2019 1607   ACIDBASEDEF 3.0 (H) 02/24/2019 0155   O2SAT 54.9 03/02/2019 0538   CBG (last 3)  Recent Labs    03/03/19 1700 03/03/19 2132  03/04/19 0635  GLUCAP 120* 114* 92    Assessment/Plan: S/P Procedure(s) (LRB): REPAIR OF TYPE A  - ACUTE ASCENDING THORACIC AORTIC DISSECTION, using Hemashield Platinum Woven Double Velour Vascular Graft (D: 290m L: 30cm) (N/A) CORONARY ARTERY BYPASS GRAFTING (CABG) x 1; Using Endoscopically Harvested Right Leg Greater Saphenous Vein Graft (SVG); SVG to RCA (N/A) TRANSESOPHAGEAL ECHOCARDIOGRAM (TEE) (N/A)  1. CV - Previous a fib. Maintaining SR and in SR with HR in the 80's this am. Tends to be hypertensive. SBP in the 140's this am.  On Amiodarone 400 mg bid, Carvedilol 18.75 mg bid, Losartan 100 mg daily, and Amlodipine 10 mg daily. Will increase Coreg to 2526mo BID today and monitor.  2.  Pulmonary - On room air. CXR 03/03/2019 showed no pneumothorax, minor atelectasis. Continue Pulmicort and Duoneb. Encourage incentive spirometer.  3. Volume Overload -Continue Lasix.  4.  Acute blood loss anemia - H and H stable at 8.4 and 26.5  5. CBGs 109/98/105. Pre op HGA1C 6. He likely has pre diabetes and will need further follow up with medical doctor after discharge. Stop accu checks and SS PRN  6. Remove every other staple right subclavian incision today  7. Plan discharge to home tomorrow.   LOS: 9 days    MyrAntony OdeaPA-Hershal [id number]/2020   Chart reviewed, patient examined, agree with above. He looks good and says he feels well. Wants to go home tomorrow.  He still has mild ankle edema and wt is a few lbs over preop. Renal function normal. Would continue Lasix and KCL for another week at home. Plan home in am.

## 2019-03-04 NOTE — Progress Notes (Addendum)
  58 y/o smoker with DM2 admitted with [place]. Found to have 90% ostial RCA lesion which was successfully stented. Unfortunately case c/b Type A aortic dissection requiring emergent surgical repair on 02/23/19.  He still looks and feels good this morning.  Only complaint is that he still has weakness of handgrip and tingling on his right hand.  Everything else seems doing well.  [facility] will sign off.   Medication Recommendations:  Continue  Amiodarone 427m BID x 7 days >> 2034mBID x 14 days and then 20092maily; amlodipine 3m44mily; Coreg 25mg56m; Losartan 100mg 35my; Lipitor 80mg d63m (increased)   Currently on ASA 324mg da78m ? Add Plavix per Dr. Harding.Ellyn Hackrsonally saw and evaluated the patient briefly today.  He looked and felt great.  Only complaint was his right hand numbness.  This may warrant evaluation for potential neurologic injury (potentially related to his aortic dissection versus perioperative)  He is otherwise on stable medications.  Would prefer to have him on aspirin 81 mg plus Plavix 75 mg on discharge, but would defer this decision to Dr. Van TrigPrescott GumTS given recent aortic surgery.  Would otherwise restart Plavix once cleared by vascular surgery.    Other recommendations (labs, testing, etc): No cardiac testing-  Consider nerve conduction evaluation for his right arm/hand tingling numbness and weakness.  Follow up as an outpatient:  Has been arranged  Signed - BhavinkuLeanor Kailavid HaGlenetta Hew

## 2019-03-04 NOTE — Discharge Summary (Addendum)
Physician Discharge Summary  Patient ID: [name] [name] MRN: [id number] DOB/AGE: 09-10-1961 58 y.o.  Admit date: 02/22/2019 Discharge date: 03/05/2019  Admission Diagnoses:  Acute type A aortic dissection Dyslipidemia Coronary artery disease Hypertension Hyperglycemia, prediabetes Chronic obstructive pulmonary disease Paroxysmal atrial fibrillation History of gout History of CVA History of NASH (nonalcoholic steatohepatitis)  Discharge Diagnoses:  Acute type A aortic dissection Dyslipidemia Coronary artery disease Hypertension Hyperglycemia, prediabetes Chronic obstructive pulmonary disease Paroxysmal atrial fibrillation History of gout History of CVA History of NASH (nonalcoholic steatohepatitis)  Discharged Condition: stable  History of Present Illness:  [name] [name] is a 58 year old male with a history of known coronary artery disease, hypertension, ongoing tobacco use, dyslipidemia, and chronic obstructive pulmonary disease.  He recently presented to the cardiologist with complaint of worsening dyspnea on exertion over the past 3 months.  He also complained of some associated chest tightness that was relieved with rest he was brought to the hospital for elective left heart catheterization to evaluate the symptoms on 02/23/2019.  He was discovered to have a 90% proximal stenosis in the right coronary artery with diffuse nonobstructive stenoses involving the LAD and circumflex systems.  During the course of the left heart catheterization procedure, dissection of the ascending aorta occurred.  We were consulted for surgical management of this Stanford type A aortic dissection.  Hospital Course:  The patient was taken to the operating room emergently where Repair of the acute type a ascending thoracic aortic dissection was carried out utilizing a 26 mm woven Hemashield graft utilizing hypothermic circulatory arrest and antegrade cerebral perfusion with right axillary  cannulation for cardiopulmonary bypass.  Single-vessel coronary bypass grafting with saphenous vein to the right coronary artery was also accomplished.  The patient had been loaded with Brilinta prior to the left heart catheterization so he had a significant coagulopathy preoperatively.  This required transfusion of multiple blood products and eventual treatment with IV NovoSeven (factor VII).  His coagulopathy resolved and he was separated from cardiopulmonary bypass successfully.  He was transferred to the cardiovascular ICU postoperatively.  His hemodynamics were supported with milrinone and low-dose norepinephrine infusion.  He had substantial volume overload related to the aggressive note of his coagulopathy so decision was made to electively keep him intubated until adequate diuresis could be accomplished.  This was facilitated with continuous Lasix infusion early postoperatively.  After he was adequately diuresed, he was extubated on the third postoperative day.  He was weaned from norepinephrine but the milrinone was continued for a few more days.  He was mobilized with physical therapy and made good progress.  He was noted to have some right hand numbness and weakness early postoperatively but this gradually improved and had nearly resolved by the time of his discharge.   Significant Diagnostic Studies:   Left heart catheterization  Diagnostic  Dominance: Co-dominant  Left Main  Vessel was injected. Vessel is normal in caliber. Vessel is angiographically normal.  Dist LM to Ost LAD lesion 45% stenosed  Dist LM to Ost LAD lesion is 45% stenosed.  Left Anterior Descending  Prox LAD lesion 30% stenosed  Prox LAD lesion is 30% stenosed.  Left Circumflex  Prox Cx to Mid Cx lesion 30% stenosed  Prox Cx to Mid Cx lesion is 30% stenosed.  Right Coronary Artery  Prox RCA lesion 90% stenosed  Prox RCA lesion is 90% stenosed.  Intervention   Prox RCA lesion  Stent  CATHETER LAUNCHER 6FR JR4  guide catheter was inserted. Lesion crossed with  guidewire using a WIRE ASAHI PROWATER 180CM. Pre-stent angioplasty was performed using a BALLOON SAPPHIRE 2.0X12. A drug-eluting stent was successfully placed using a STENT SYNERGY DES 2.5X16. Stent strut is well apposed. Post-stent angioplasty was performed using a BALLOON SAPPHIRE [place] [id number]. Maximum pressure: 18 atm.  Stent  A drug-eluting stent was successfully placed using a STENT SYNERGY DES 2.75X12. Maximum pressure: 14 atm.  Post-Intervention Lesion Assessment  The intervention was successful. Pre-interventional TIMI flow is 3. Post-intervention TIMI flow is 3. At this lesion, a dissection occurred.  There is a 0% residual stenosis post intervention.   Diagnostic  Dominance: Co-dominant    Intervention      Treatments: Surgery  OPERATIVE REPORT  DATE OF PROCEDURE:  02/23/2019  OPERATION:   1.  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 2.  Coronary artery bypass grafting x1 using an endoscopic harvest of right greater saphenous vein to right coronary artery.  SURGEON:  Ivin Poot, MD  ASSISTANT:  Jadene Pierini, PA-C.  ANESTHESIA:  General.  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  POSTOPERATIVE DIAGNOSIS: Acute type A Stanford aortic dissection during PCI of the right coronary artery with pre-cath Brilinta load induced coagulopathy.    OPERATIVE FINDINGS: 1.  Intimal tear originating in the right coronary ostium with extension through the ascending aorta and extending into the arch and descending thoracic aorta. 2.  Small codominant right coronary  artery, but graftable. 3.  Good quality saphenous vein conduit. 4.  Severe diffuse coagulopathy requiring multiple blood product transfusions and factor VII dosing to control bleeding  CLINICAL NOTE:  I was asked to see this patient emergently after cardiac catheterization and PCI on the day of surgery.  He had presented with symptoms of exertional shortness of breath with underlying history of COPD and heavy smoking.  Cardiac  catheterization demonstrated a proximal 90% stenosis of a codominant right coronary artery and a drug-eluting stent was placed.  There was mild to moderate disease of the left main and LAD and circumflex distributions.  The patient was loaded with  Brilinta 180 mg prior to the catheterization and the stent was placed uneventfully.  At the end of the procedure; however, evidence of an aortic root dissection was noted on the last injection and this was confirmed by CTA.  After the CTA, I saw the  patient emergently in the cardiac cath lab area and recommended emergency surgery for repair of the type A dissection along with saphenous vein graft bypass to the RCA.  I discussed the details of the procedure with the patient and family including the  use of general anesthesia and cardiopulmonary bypass, the location of surgical incisions, and the expected postoperative recovery.  I also reviewed with the patient the risks of bleeding since he had received platelet inhibition drugs and a loading dose  just prior to the cath lab procedure, the risk of stroke due to the need for probable hypothermic circulatory arrest to repair of the dissection, the risk of postoperative organ failure, and the risk of postoperative infection and the risk of death.   After reviewing these issues, the patient demonstrated his understanding and agreed to proceed with surgery under what I felt  was informed consent.  OPERATIVE FINDINGS: 1.  Short, obese body habitus made difficult exposure of the heart and  axillary artery. 2.  Severe pre and postop pump coagulopathy requiring blood product transfusions. 3.  Successful replacement of the ascending aorta from the sinotubular junction to the proximal arch with a 26 mm Hemashield graft and saphenous vein grafting from the reconstructed ascending aorta to the RCA. Discharge Exam: Blood pressure 131/69, pulse 83, temperature 98.1 F (36.7 C), temperature source Oral, resp. rate (!) 24, height 5' 8"  (1.727 m), weight 104.7 kg, SpO2 98 %.   General appearance:alert, cooperative and no distress Heart:regular rate and rhythm.  All extremities well perfused Lungs:Breath sounds are clear. Wound:The right subclavian and sternal incisions are healing with no sign of complication.   Disposition:   Discharge Instructions    Amb Referral to Cardiac Rehabilitation   Complete by:  As directed    Diagnosis:  CABG   CABG X ___:  1     Allergies as of 03/05/2019      Reactions   Penicillins Hives, Rash   Did it involve swelling of the face/tongue/throat, SOB, or low BP? Yes Did it involve sudden or severe rash/hives, skin peeling, or any reaction on the inside of your mouth or nose? Yes  Did you need to seek medical attention at a hospital or doctor's office? Yes When did it last happen?As a child. If all above answers are "NO", may proceed with cephalosporin use.      Medication List    STOP taking these medications   metoprolol tartrate 100 MG tablet Commonly known as:  LOPRESSOR   Milk Thistle 500 MG Caps   naproxen 500 MG tablet Commonly known as:  NAPROSYN   niacin 1000 MG CR tablet Commonly known as:  NIASPAN     TAKE these medications   acetaminophen 325 MG tablet Commonly known as:  TYLENOL Take 2 tablets (650 mg total) by mouth every 4 (four) hours as needed for headache or mild pain.   albuterol 108 (90 Base) MCG/ACT inhaler Commonly known as:  PROVENTIL HFA;VENTOLIN HFA Inhale 1 puff into the lungs every 6 (six)  hours as needed for wheezing or shortness of breath.   allopurinol 100 MG tablet Commonly known as:  ZYLOPRIM Take 100 mg by mouth every morning.   amiodarone 200 MG tablet Commonly known as:  PACERONE Take 2 tablets (400 mg total) by mouth every 12 (twelve) hours. Take 2 tablets by mouth twice daily for 7 days then 1 tablet by mouth twice daily thereafter.   amLODipine 10 MG tablet Commonly known as:  NORVASC Take 1 tablet (10 mg total) by mouth daily. Start taking on:  March 06, 2019 What changed:  when to take this   aspirin 325 MG tablet Take 325 mg by mouth every morning.   atorvastatin 80 MG tablet Commonly known as:  LIPITOR Take 1 tablet (80 mg total) by mouth daily at 6 PM.   carvedilol 25 MG tablet Commonly known as:  COREG Take 1 tablet (25 mg total) by mouth 2 (two) times daily with a meal.   esomeprazole 40 MG capsule Commonly known as:  NEXIUM Take 40 mg by mouth 2 (two) times daily.   Fish Oil 1200 MG Cpdr Take 1,200 mg by mouth daily.   furosemide 40 MG tablet Commonly known as:  LASIX Take 1 tablet (40 mg total) by mouth daily. Take for 5 days then stop. Start taking on:  March 06, 2019   HYDROcodone-acetaminophen 5-325 MG tablet Commonly known as:  NORCO/VICODIN Take 1 tablet by mouth every 4 (four) hours as needed for up to 7 days for moderate pain or severe pain. What changed:    how much to take  when to take this   ipratropium-albuterol 0.5-2.5 (3) MG/3ML Soln Commonly known as:  DUONEB Take 3 mLs by nebulization every 8 (eight) hours as needed (wheezing, shortness of breath).   losartan 100 MG tablet Commonly known as:  COZAAR Take 1 tablet (100 mg total) by mouth daily. Start taking on:  March 06, 2019 What changed:  when to take this   multivitamin with minerals Tabs tablet Take 1 tablet by mouth daily.   nitroGLYCERIN 0.4 MG SL tablet Commonly known as:  NITROSTAT Place 0.4 mg under the tongue every 5 (five) minutes as needed. For  chest pain   potassium chloride SA 20 MEQ tablet Commonly known as:  K-DUR,KLOR-CON Take 1 tablet (20 mEq total) by mouth daily. Take for 5 days then stop. Start taking on:  March 06, 2019   traZODone 100 MG tablet Commonly known as:  DESYREL Take 100 mg by mouth at bedtime.      Follow-up Information    Erma Heritage, PA-C Follow up on 03/10/2019.   Specialties:  Physician Assistant, Cardiology Why:  [facility] [facility] on 03/10/2019 at 3:30 PM. This replaces your previously scheduled appointment with Dr. Harl Bowie.  Contact information: [place] [address] [phone]        Shirline Frees, MD. Call.   Specialty:  Family Medicine Why:  for a follow up appointment regarding further surveillance HGA1C 6 (pre diabetes) Contact information: [name] [place] [address] [phone]        Ivin Poot, MD. Go on 04/06/2019.   Specialty:  Cardiothoracic Surgery Why:  You have an appointment with Dr. Darcey Nora on Wednesday, 04/06/2019 at 11:30 AM.  Please arrive at 11:00 for chest x-ray to be performed on the first floor of the same building Contact information: [address] [place] [address] [phone]        [facility] [place]. Go on 03/11/2019.   Specialty:  Cardiothoracic Surgery Why:  You have an appointment with the RN at Dr. Lucianne Lei Trigt's office on Friday 03/11/2019 at 10 AM for staple removal Contact information: [place], [place] [phone]         The patient has been discharged on:   1.Beta Blocker:  Yes [ x  ]                              No   [   ]                              If No, reason:  2.Ace Inhibitor/ARB: Yes [ x  ]                                     No  [    ]  If No, reason:  3.Statin:   Yes [  x ]                  No  [   ]                  If No,  reason:  4.Ecasa:  Yes  [  x ]                  No   [   ]                  If No, reason:   Signed: Antony Odea, PA-C 03/05/2019, 12:09 PM  patient examined and medical record reviewed,agree with above note. Tharon Aquas Trigt III 03/14/2019

## 2019-03-04 NOTE — Progress Notes (Signed)
Occupational Therapy Treatment Patient Details Name: [name] [name] MRN: [id number] DOB: [birth year] Today's Date: 03/04/2019    History of present illness 58 yo male admitted with chest pain from [facility]. Pt underwent cardiac cath with stent placement. 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 Underwent emergent repair of aortic dissection and CABG x 1 on 2/26. PMH - CAD, gout, copd, CVA, back surgery   OT comments  Pt making good progress with functional goals and reports that he is feeling much better and that he feels like his R hand is improving. OT will continue to follow acutely  Follow Up Recommendations  Outpatient OT;Supervision - Intermittent    Equipment Recommendations  None recommended by OT    Recommendations for Other Services      Precautions / Restrictions Precautions Precautions: Sternal Precaution Comments: pt able to reall sternal precautions Restrictions Weight Bearing Restrictions: Yes Other Position/Activity Restrictions: sternal precautions       Mobility Bed Mobility               General bed mobility comments: in recliner upon arrival  Transfers Overall transfer level: Needs assistance Equipment used: 1 person hand held assist Transfers: Sit to/from Stand Sit to Stand: Supervision         General transfer comment: cues for safety    Balance Overall balance assessment: Needs assistance Sitting-balance support: Feet supported Sitting balance-Leahy Scale: Good     Standing balance support: Bilateral upper extremity supported;During functional activity Standing balance-Leahy Scale: Fair                             ADL either performed or assessed with clinical judgement   ADL Overall ADL's : Needs assistance/impaired Eating/Feeding: Minimal assistance;Sitting Eating/Feeding Details (indicate cue type and reason): simulated using red tubing for utensils, needed min assist to open bag of snacks Grooming: Minimal  assistance;Standing;Wash/dry hands;Wash/dry face;Oral care;Brushing hair   Upper Body Bathing: Minimal assistance;Sitting Upper Body Bathing Details (indicate cue type and reason): simulated Lower Body Bathing: Moderate assistance;Minimal assistance Lower Body Bathing Details (indicate cue type and reason): simulated Upper Body Dressing : Minimal assistance;Sitting       Toilet Transfer: Moderate assistance;Minimal assistance           Functional mobility during ADLs: Supervision/safety General ADL Comments: reviewed compensatory techniques for ADLs, educated on A/E (built up grip handles)     Vision Patient Visual Report: No change from baseline     Perception     Praxis      Cognition Arousal/Alertness: Awake/alert Behavior During Therapy: WFL for tasks assessed/performed Overall Cognitive Status: Within Functional Limits for tasks assessed                                          Exercises Other Exercises Other Exercises: functional grasp/release with yellow squeeze ball and wringing out wet washcloth   Shoulder Instructions       General Comments      Pertinent Vitals/ Pain       Pain Assessment: 0-10 Pain Score: 3  Pain Location: R shoulder Pain Descriptors / Indicators: Discomfort;Sore Pain Intervention(s): Monitored during session  Home Living  Prior Functioning/Environment              Frequency  Min 3X/week        Progress Toward Goals  OT Goals(current goals can now be found in the care plan section)  Progress towards OT goals: Progressing toward goals  Acute Rehab OT Goals Patient Stated Goal: to be able to get home and feel better  Plan Discharge plan remains appropriate    Co-evaluation                 AM-PAC OT "6 Clicks" Daily Activity     Outcome Measure   Help from another person eating meals?: A Little Help from another person taking  care of personal grooming?: A Little Help from another person toileting, which includes using toliet, bedpan, or urinal?: A Little Help from another person bathing (including washing, rinsing, drying)?: A Little Help from another person to put on and taking off regular upper body clothing?: A Little Help from another person to put on and taking off regular lower body clothing?: A Little 6 Click Score: 18    End of Session Equipment Utilized During Treatment: Gait belt  OT Visit Diagnosis: Unsteadiness on feet (R26.81);Muscle weakness (generalized) (M62.81);Pain Pain - Right/Left: Left Pain - part of body: Shoulder   Activity Tolerance Patient tolerated treatment well   Patient Left in chair;with call bell/phone within reach   Nurse Communication          Time: 8979-1504 OT Time Calculation (min): 25 min  Charges: OT General Charges $OT Visit: 1 Visit OT Treatments $Self Care/Home Management : 8-22 mins $Therapeutic Exercise: 8-22 mins     Britt Bottom 03/04/2019, 12:36 PM

## 2019-03-05 LAB — BASIC METABOLIC PANEL
ANION GAP: 6 (ref 5–15)
BUN: 16 mg/dL (ref 6–20)
CO2: 23 mmol/L (ref 22–32)
Calcium: 9 mg/dL (ref 8.9–10.3)
Chloride: 107 mmol/L (ref 98–111)
Creatinine, Ser: 0.95 mg/dL (ref 0.61–1.24)
GFR calc Af Amer: >60 mL/min (ref >60)
GFR calc non Af Amer: >60 mL/min (ref >60)
Glucose, Bld: 111 mg/dL — ABNORMAL HIGH (ref 70–99)
Potassium: 4.5 mmol/L (ref 3.5–5.1)
Sodium: 136 mmol/L (ref 135–145)

## 2019-03-05 LAB — CBC
HCT: 26.3 % — ABNORMAL LOW (ref 39.0–52.0)
Hemoglobin: 8.1 g/dL — ABNORMAL LOW (ref 13.0–17.0)
MCH: 28.9 pg (ref 26.0–34.0)
MCHC: 30.8 g/dL (ref 30.0–36.0)
MCV: 93.9 fL (ref 80.0–100.0)
Platelets: 368 10*3/uL (ref 150–400)
RBC: 2.8 MIL/uL — ABNORMAL LOW (ref 4.22–5.81)
RDW: 14 % (ref 11.5–15.5)
WBC: 11 10*3/uL — ABNORMAL HIGH (ref 4.0–10.5)
nRBC: 0 % (ref 0.0–0.2)

## 2019-03-05 LAB — GLUCOSE, CAPILLARY
Glucose-Capillary: 107 mg/dL — ABNORMAL HIGH (ref 70–99)
Glucose-Capillary: 111 mg/dL — ABNORMAL HIGH (ref 70–99)

## 2019-03-05 MED ORDER — AMIODARONE HCL 200 MG PO TABS: 400.0000 mg | ORAL_TABLET | Freq: Two times a day (BID) | ORAL | 1 refills | Status: DC

## 2019-03-05 MED ORDER — POTASSIUM CHLORIDE CRYS ER 20 MEQ PO TBCR: 20.0000 meq | EXTENDED_RELEASE_TABLET | Freq: Every day | ORAL | 0 refills | Status: DC

## 2019-03-05 MED ORDER — LOSARTAN POTASSIUM 100 MG PO TABS: 100.0000 mg | ORAL_TABLET | Freq: Every day | ORAL | 2 refills | Status: AC

## 2019-03-05 MED ORDER — AMLODIPINE BESYLATE 10 MG PO TABS: 10.0000 mg | ORAL_TABLET | Freq: Every day | ORAL | 2 refills | Status: DC

## 2019-03-05 MED ORDER — ATORVASTATIN CALCIUM 80 MG PO TABS: 80.0000 mg | ORAL_TABLET | Freq: Every day | ORAL | 2 refills | Status: AC

## 2019-03-05 MED ORDER — CARVEDILOL 25 MG PO TABS: 25.0000 mg | ORAL_TABLET | Freq: Two times a day (BID) | ORAL | 2 refills | Status: DC

## 2019-03-05 MED ORDER — HYDROCODONE-ACETAMINOPHEN 5-325 MG PO TABS: 1.0000 | ORAL_TABLET | ORAL | 0 refills | Status: AC | PRN

## 2019-03-05 MED ORDER — ACETAMINOPHEN 325 MG PO TABS: 650.0000 mg | ORAL_TABLET | ORAL | Status: DC | PRN

## 2019-03-05 MED ORDER — FUROSEMIDE 40 MG PO TABS: 40.0000 mg | ORAL_TABLET | Freq: Every day | ORAL | 0 refills | Status: DC

## 2019-03-05 NOTE — Progress Notes (Addendum)
10 Days Post-Op Procedure(s) (LRB): REPAIR OF TYPE A  - ACUTE ASCENDING THORACIC AORTIC DISSECTION, using Hemashield Platinum Woven Double Velour Vascular Graft (D: 64m, L: 30cm) (N/A) CORONARY ARTERY BYPASS GRAFTING (CABG) x 1; Using Endoscopically Harvested Right Leg Greater Saphenous Vein Graft (SVG); SVG to RCA (N/A) TRANSESOPHAGEAL ECHOCARDIOGRAM (TEE) (N/A) Subjective:  No new problems.  He says he feels well and is anxious to go home.  Objective: Vital signs in last 24 hours: Temp:  [98.1 F (36.7 C)-98.3 F (36.8 C)] 98.1 F (36.7 C) (03/07 0433) Pulse Rate:  [72-83] 83 (03/07 0433) Cardiac Rhythm: Normal sinus rhythm;Bundle branch block (03/07 0701) Resp:  [18-24] 24 (03/07 0433) BP: (120-131)/(64-69) 131/69 (03/07 0433) SpO2:  [97 %-100 %] 98 % (03/07 0846) Weight:  [104.7 kg] 104.7 kg (03/07 0400)    Intake/Output from previous day: 03/06 0701 - 03/07 0700 In: 300 [P.O.:300] Out: -  Intake/Output this shift: No intake/output data recorded.  General appearance: alert, cooperative and no distress Heart: regular rate and rhythm Lungs: Breath sounds are clear. Wound: The right subclavian and sternal incisions are healing with no sign of complication.  Lab Results: Recent Labs    03/04/19 0252 03/05/19 0427  WBC 12.2* 11.0*  HGB 8.4* 8.1*  HCT 26.5* 26.3*  PLT 329 368   BMET:  Recent Labs    03/04/19 0252 03/05/19 0427  NA 135 136  K 4.3 4.5  CL 103 107  CO2 24 23  GLUCOSE 109* 111*  BUN 19 16  CREATININE 1.00 0.95  CALCIUM 8.9 9.0    PT/INR: No results for input(s): LABPROT, INR in the last 72 hours. ABG    Component Value Date/Time   PHART 7.541 (H) 02/26/2019 1607   HCO3 31.5 (H) 02/26/2019 1607   TCO2 33 (H) 02/26/2019 1607   ACIDBASEDEF 3.0 (H) 02/24/2019 0155   O2SAT 54.9 03/02/2019 0538   CBG (last 3)  Recent Labs    03/04/19 2133 03/05/19 0647 03/05/19 1116  GLUCAP 101* 107* 111*    Assessment/Plan: S/P Procedure(s)  (LRB): REPAIR OF TYPE A  - ACUTE ASCENDING THORACIC AORTIC DISSECTION, using Hemashield Platinum Woven Double Velour Vascular Graft (D: 262m L: 30cm) (N/A) CORONARY ARTERY BYPASS GRAFTING (CABG) x 1; Using Endoscopically Harvested Right Leg Greater Saphenous Vein Graft (SVG); SVG to RCA (N/A) TRANSESOPHAGEAL ECHOCARDIOGRAM (TEE) (N/A)   1. CV -Previous a fib. Maintaining SR and in SR with HR in the 80's this am.   Blood pressure control is satisfactory on current regimen.  2. Pulmonary -he is maintaining adequate O2 sats on room air.  3. Volume Overload -still have some mild peripheral edema continue Lasix.  4. Acute blood loss anemia - H and H stable   5.  Hyperglycemia-. Pre op HGA1C 6. He likely has pre diabetes and will need further follow up with medical doctor after discharge. Stop accu checks and SS PRN  Plan discharged home today.  Instructions regarding diet activity and wound care discussed with the patient.   LOS: 10 days    MyAntony OdeaPA-C [id number]/06/2019  I have seen and examined the patient and agree with the assessment and plan as outlined.  ClRexene AlbertsMD 03/05/2019 12:56 PM

## 2019-03-05 NOTE — Care Management (Addendum)
Rolling walker ordered from [employer] to be delivered prior to d/c.

## 2019-03-06 NOTE — Progress Notes (Signed)
Discharged to home with family office visits in place teaching done  

## 2019-03-08 ENCOUNTER — Ambulatory Visit: Payer: Medicare Other | Admitting: Cardiology

## 2019-03-10 ENCOUNTER — Ambulatory Visit: Payer: Medicare Other | Admitting: Student

## 2019-03-11 ENCOUNTER — Other Ambulatory Visit: Payer: Self-pay

## 2019-03-11 ENCOUNTER — Ambulatory Visit (INDEPENDENT_AMBULATORY_CARE_PROVIDER_SITE_OTHER): Payer: Self-pay

## 2019-03-11 DIAGNOSIS — Z4802 Encounter for removal of sutures: Secondary | ICD-10-CM

## 2019-03-11 NOTE — Progress Notes (Signed)
Patient arrived for nurse visit to remove 4 sutures/10 staples post- procedure TAA repair/ CABG with Dr. Prescott Gum 02/23/2019.  Sutures/staples removed with no signs/ symptoms of infection noted.  Patient tolerated procedure well.  Patient instructed to keep the incision sites clean and dry.  Patient acknowledged instructions given.   Advised patient if he noticed any changes to the incision site color with temperature (for example increase in drainage, swelling, redness, red streaks, puss, foul odor or smell) to call the office for further evaluation.  Patient acknowledged receipt.  Patient is aware of his follow-up appointments.

## 2019-03-17 ENCOUNTER — Telehealth: Payer: Self-pay | Admitting: *Deleted

## 2019-03-17 ENCOUNTER — Other Ambulatory Visit: Payer: Self-pay

## 2019-03-17 ENCOUNTER — Other Ambulatory Visit: Payer: Self-pay | Admitting: Cardiothoracic Surgery

## 2019-03-17 DIAGNOSIS — Z951 Presence of aortocoronary bypass graft: Secondary | ICD-10-CM

## 2019-03-17 NOTE — Telephone Encounter (Signed)
[name] called to relate chest and back  discomfort for a day and a half that he describes as aching that is not associated with n/v , shortness of breath or diaphoresis.  It is somewhat relieved with pain meds but not for long.  This pain is different from any that he has had.  I discussed this with Dr. Nils Pyle.  He would like to see him tomorrow in the office with a CXR.  I related the instructions to [name] and he agreed.

## 2019-03-18 ENCOUNTER — Inpatient Hospital Stay (HOSPITAL_COMMUNITY): Payer: Medicare Other

## 2019-03-18 ENCOUNTER — Emergency Department (HOSPITAL_COMMUNITY): Payer: Medicare Other

## 2019-03-18 ENCOUNTER — Encounter (HOSPITAL_COMMUNITY): Payer: Self-pay | Admitting: Emergency Medicine

## 2019-03-18 ENCOUNTER — Inpatient Hospital Stay (HOSPITAL_COMMUNITY)
Admission: EM | Admit: 2019-03-18 | Discharge: 2019-04-07 | DRG: 856 | Disposition: A | Discharge status: Home Health | Payer: Medicare Other | Attending: Cardiothoracic Surgery | Admitting: Cardiothoracic Surgery

## 2019-03-18 ENCOUNTER — Ambulatory Visit: Payer: Self-pay | Admitting: Cardiothoracic Surgery

## 2019-03-18 DIAGNOSIS — Z8349 Family history of other endocrine, nutritional and metabolic diseases: Secondary | ICD-10-CM

## 2019-03-18 DIAGNOSIS — E876 Hypokalemia: Secondary | ICD-10-CM | POA: Diagnosis not present

## 2019-03-18 DIAGNOSIS — I5033 Acute on chronic diastolic (congestive) heart failure: Secondary | ICD-10-CM | POA: Diagnosis present

## 2019-03-18 DIAGNOSIS — J9601 Acute respiratory failure with hypoxia: Secondary | ICD-10-CM | POA: Diagnosis present

## 2019-03-18 DIAGNOSIS — I4510 Unspecified right bundle-branch block: Secondary | ICD-10-CM | POA: Diagnosis present

## 2019-03-18 DIAGNOSIS — I313 Pericardial effusion (noninflammatory): Secondary | ICD-10-CM | POA: Diagnosis present

## 2019-03-18 DIAGNOSIS — D509 Iron deficiency anemia, unspecified: Secondary | ICD-10-CM | POA: Diagnosis present

## 2019-03-18 DIAGNOSIS — I5032 Chronic diastolic (congestive) heart failure: Secondary | ICD-10-CM | POA: Diagnosis not present

## 2019-03-18 DIAGNOSIS — F17211 Nicotine dependence, cigarettes, in remission: Secondary | ICD-10-CM | POA: Diagnosis present

## 2019-03-18 DIAGNOSIS — Z8679 Personal history of other diseases of the circulatory system: Secondary | ICD-10-CM

## 2019-03-18 DIAGNOSIS — Z7982 Long term (current) use of aspirin: Secondary | ICD-10-CM

## 2019-03-18 DIAGNOSIS — I441 Atrioventricular block, second degree: Secondary | ICD-10-CM | POA: Diagnosis not present

## 2019-03-18 DIAGNOSIS — Z79899 Other long term (current) drug therapy: Secondary | ICD-10-CM

## 2019-03-18 DIAGNOSIS — K219 Gastro-esophageal reflux disease without esophagitis: Secondary | ICD-10-CM | POA: Diagnosis present

## 2019-03-18 DIAGNOSIS — L02213 Cutaneous abscess of chest wall: Secondary | ICD-10-CM | POA: Diagnosis present

## 2019-03-18 DIAGNOSIS — I361 Nonrheumatic tricuspid (valve) insufficiency: Secondary | ICD-10-CM | POA: Diagnosis not present

## 2019-03-18 DIAGNOSIS — Z8249 Family history of ischemic heart disease and other diseases of the circulatory system: Secondary | ICD-10-CM

## 2019-03-18 DIAGNOSIS — I480 Paroxysmal atrial fibrillation: Secondary | ICD-10-CM | POA: Diagnosis present

## 2019-03-18 DIAGNOSIS — R079 Chest pain, unspecified: Secondary | ICD-10-CM | POA: Diagnosis present

## 2019-03-18 DIAGNOSIS — R0789 Other chest pain: Secondary | ICD-10-CM

## 2019-03-18 DIAGNOSIS — D62 Acute posthemorrhagic anemia: Secondary | ICD-10-CM | POA: Diagnosis not present

## 2019-03-18 DIAGNOSIS — I2510 Atherosclerotic heart disease of native coronary artery without angina pectoris: Secondary | ICD-10-CM | POA: Diagnosis present

## 2019-03-18 DIAGNOSIS — I10 Essential (primary) hypertension: Secondary | ICD-10-CM | POA: Diagnosis present

## 2019-03-18 DIAGNOSIS — E669 Obesity, unspecified: Secondary | ICD-10-CM | POA: Diagnosis present

## 2019-03-18 DIAGNOSIS — K2270 Barrett's esophagus without dysplasia: Secondary | ICD-10-CM | POA: Diagnosis present

## 2019-03-18 DIAGNOSIS — I071 Rheumatic tricuspid insufficiency: Secondary | ICD-10-CM | POA: Diagnosis present

## 2019-03-18 DIAGNOSIS — S21109A Unspecified open wound of unspecified front wall of thorax without penetration into thoracic cavity, initial encounter: Secondary | ICD-10-CM | POA: Diagnosis not present

## 2019-03-18 DIAGNOSIS — E1151 Type 2 diabetes mellitus with diabetic peripheral angiopathy without gangrene: Secondary | ICD-10-CM | POA: Diagnosis present

## 2019-03-18 DIAGNOSIS — Z452 Encounter for adjustment and management of vascular access device: Secondary | ICD-10-CM

## 2019-03-18 DIAGNOSIS — Y832 Surgical operation with anastomosis, bypass or graft as the cause of abnormal reaction of the patient, or of later complication, without mention of misadventure at the time of the procedure: Secondary | ICD-10-CM | POA: Diagnosis present

## 2019-03-18 DIAGNOSIS — T8141XA Infection following a procedure, superficial incisional surgical site, initial encounter: Secondary | ICD-10-CM | POA: Diagnosis present

## 2019-03-18 DIAGNOSIS — E785 Hyperlipidemia, unspecified: Secondary | ICD-10-CM | POA: Diagnosis present

## 2019-03-18 DIAGNOSIS — J449 Chronic obstructive pulmonary disease, unspecified: Secondary | ICD-10-CM | POA: Diagnosis present

## 2019-03-18 DIAGNOSIS — M109 Gout, unspecified: Secondary | ICD-10-CM | POA: Diagnosis present

## 2019-03-18 DIAGNOSIS — Z9889 Other specified postprocedural states: Secondary | ICD-10-CM | POA: Diagnosis not present

## 2019-03-18 DIAGNOSIS — T8149XA Infection following a procedure, other surgical site, initial encounter: Secondary | ICD-10-CM | POA: Diagnosis not present

## 2019-03-18 DIAGNOSIS — K7581 Nonalcoholic steatohepatitis (NASH): Secondary | ICD-10-CM | POA: Diagnosis present

## 2019-03-18 DIAGNOSIS — I252 Old myocardial infarction: Secondary | ICD-10-CM

## 2019-03-18 DIAGNOSIS — F419 Anxiety disorder, unspecified: Secondary | ICD-10-CM | POA: Diagnosis present

## 2019-03-18 DIAGNOSIS — Z833 Family history of diabetes mellitus: Secondary | ICD-10-CM

## 2019-03-18 DIAGNOSIS — I9789 Other postprocedural complications and disorders of the circulatory system, not elsewhere classified: Secondary | ICD-10-CM | POA: Diagnosis present

## 2019-03-18 DIAGNOSIS — A419 Sepsis, unspecified organism: Secondary | ICD-10-CM | POA: Diagnosis present

## 2019-03-18 DIAGNOSIS — Z951 Presence of aortocoronary bypass graft: Secondary | ICD-10-CM

## 2019-03-18 DIAGNOSIS — Z6832 Body mass index (BMI) 32.0-32.9, adult: Secondary | ICD-10-CM

## 2019-03-18 DIAGNOSIS — R509 Fever, unspecified: Secondary | ICD-10-CM | POA: Diagnosis present

## 2019-03-18 DIAGNOSIS — Z880 Allergy status to penicillin: Secondary | ICD-10-CM

## 2019-03-18 DIAGNOSIS — I7101 Dissection of thoracic aorta: Secondary | ICD-10-CM | POA: Diagnosis not present

## 2019-03-18 DIAGNOSIS — Z955 Presence of coronary angioplasty implant and graft: Secondary | ICD-10-CM

## 2019-03-18 DIAGNOSIS — I110 Hypertensive heart disease with heart failure: Secondary | ICD-10-CM | POA: Diagnosis present

## 2019-03-18 DIAGNOSIS — I7100 Dissection of unspecified site of aorta: Secondary | ICD-10-CM | POA: Diagnosis present

## 2019-03-18 DIAGNOSIS — Z8673 Personal history of transient ischemic attack (TIA), and cerebral infarction without residual deficits: Secondary | ICD-10-CM

## 2019-03-18 DIAGNOSIS — G8929 Other chronic pain: Secondary | ICD-10-CM | POA: Diagnosis present

## 2019-03-18 DIAGNOSIS — Z8379 Family history of other diseases of the digestive system: Secondary | ICD-10-CM

## 2019-03-18 DIAGNOSIS — T8142XA Infection following a procedure, deep incisional surgical site, initial encounter: Secondary | ICD-10-CM | POA: Diagnosis not present

## 2019-03-18 LAB — COMPREHENSIVE METABOLIC PANEL
ALBUMIN: 2.6 g/dL — AB (ref 3.5–5.0)
ALT: 29 U/L (ref 0–44)
AST: 19 U/L (ref 15–41)
Alkaline Phosphatase: 106 U/L (ref 38–126)
Anion gap: 6 (ref 5–15)
BUN: 13 mg/dL (ref 6–20)
CO2: 27 mmol/L (ref 22–32)
CREATININE: 1.02 mg/dL (ref 0.61–1.24)
Calcium: 9.2 mg/dL (ref 8.9–10.3)
Chloride: 104 mmol/L (ref 98–111)
GFR calc Af Amer: >60 mL/min (ref >60)
GFR calc non Af Amer: >60 mL/min (ref >60)
Glucose, Bld: 123 mg/dL — ABNORMAL HIGH (ref 70–99)
Potassium: 4.2 mmol/L (ref 3.5–5.1)
Sodium: 137 mmol/L (ref 135–145)
Total Bilirubin: 0.5 mg/dL (ref 0.3–1.2)
Total Protein: 6.8 g/dL (ref 6.5–8.1)

## 2019-03-18 LAB — I-STAT TROPONIN, ED: Troponin i, poc: 0 ng/mL (ref 0.00–0.08)

## 2019-03-18 LAB — CBC WITH DIFFERENTIAL/PLATELET
Abs Immature Granulocytes: 0.08 10*3/uL — ABNORMAL HIGH (ref 0.00–0.07)
Basophils Absolute: 0 10*3/uL (ref 0.0–0.1)
Basophils Relative: 0 %
Eosinophils Absolute: 0.2 10*3/uL (ref 0.0–0.5)
Eosinophils Relative: 2 %
HCT: 27 % — ABNORMAL LOW (ref 39.0–52.0)
Hemoglobin: 8.1 g/dL — ABNORMAL LOW (ref 13.0–17.0)
Immature Granulocytes: 1 %
Lymphocytes Relative: 14 %
Lymphs Abs: 1.7 10*3/uL (ref 0.7–4.0)
MCH: 27.4 pg (ref 26.0–34.0)
MCHC: 30 g/dL (ref 30.0–36.0)
MCV: 91.2 fL (ref 80.0–100.0)
Monocytes Absolute: 1.1 10*3/uL — ABNORMAL HIGH (ref 0.1–1.0)
Monocytes Relative: 10 %
Neutro Abs: 8.8 10*3/uL — ABNORMAL HIGH (ref 1.7–7.7)
Neutrophils Relative %: 73 %
Platelets: 455 10*3/uL — ABNORMAL HIGH (ref 150–400)
RBC: 2.96 MIL/uL — ABNORMAL LOW (ref 4.22–5.81)
RDW: 14 % (ref 11.5–15.5)
WBC: 11.9 10*3/uL — ABNORMAL HIGH (ref 4.0–10.5)
nRBC: 0 % (ref 0.0–0.2)

## 2019-03-18 LAB — RESPIRATORY PANEL BY PCR
Adenovirus: NOT DETECTED
Bordetella pertussis: NOT DETECTED
CORONAVIRUS OC43-RVPPCR: NOT DETECTED
Chlamydophila pneumoniae: NOT DETECTED
Coronavirus 229E: NOT DETECTED
Coronavirus HKU1: NOT DETECTED
Coronavirus NL63: NOT DETECTED
Influenza A: NOT DETECTED
Influenza B: NOT DETECTED
METAPNEUMOVIRUS-RVPPCR: NOT DETECTED
Mycoplasma pneumoniae: NOT DETECTED
PARAINFLUENZA VIRUS 1-RVPPCR: NOT DETECTED
Parainfluenza Virus 2: NOT DETECTED
Parainfluenza Virus 3: NOT DETECTED
Parainfluenza Virus 4: NOT DETECTED
Respiratory Syncytial Virus: NOT DETECTED
Rhinovirus / Enterovirus: NOT DETECTED

## 2019-03-18 LAB — POCT I-STAT 7, (LYTES, BLD GAS, ICA,H+H)
Acid-Base Excess: 1 mmol/L (ref 0.0–2.0)
Bicarbonate: 24.1 mmol/L (ref 20.0–28.0)
CALCIUM ION: 1.24 mmol/L (ref 1.15–1.40)
HCT: 23 % — ABNORMAL LOW (ref 39.0–52.0)
Hemoglobin: 7.8 g/dL — ABNORMAL LOW (ref 13.0–17.0)
O2 Saturation: 91 %
Patient temperature: 101.3
Potassium: 4 mmol/L (ref 3.5–5.1)
Sodium: 136 mmol/L (ref 135–145)
TCO2: 25 mmol/L (ref 22–32)
pCO2 arterial: 35 mmHg (ref 32.0–48.0)
pH, Arterial: 7.451 — ABNORMAL HIGH (ref 7.350–7.450)
pO2, Arterial: 62 mmHg — ABNORMAL LOW (ref 83.0–108.0)

## 2019-03-18 LAB — URINALYSIS, ROUTINE W REFLEX MICROSCOPIC
BILIRUBIN URINE: NEGATIVE
Glucose, UA: NEGATIVE mg/dL
Hgb urine dipstick: NEGATIVE
KETONES UR: NEGATIVE mg/dL
Leukocytes,Ua: NEGATIVE
Nitrite: NEGATIVE
PH: 5 (ref 5.0–8.0)
Protein, ur: NEGATIVE mg/dL
SPECIFIC GRAVITY, URINE: 1.013 (ref 1.005–1.030)

## 2019-03-18 LAB — APTT: aPTT: 35 seconds (ref 24–36)

## 2019-03-18 LAB — PROTIME-INR
INR: 1.1 (ref 0.8–1.2)
Prothrombin Time: 14.5 seconds (ref 11.4–15.2)

## 2019-03-18 LAB — MRSA PCR SCREENING: MRSA BY PCR: NEGATIVE

## 2019-03-18 LAB — LACTIC ACID, PLASMA
Lactic Acid, Venous: 2.9 mmol/L (ref 0.5–1.9)
Lactic Acid, Venous: 1.4 mmol/L (ref 0.5–1.9)

## 2019-03-18 LAB — ECHOCARDIOGRAM COMPLETE

## 2019-03-18 LAB — INFLUENZA PANEL BY PCR (TYPE A & B)
Influenza A By PCR: NEGATIVE
Influenza B By PCR: NEGATIVE

## 2019-03-18 LAB — BRAIN NATRIURETIC PEPTIDE: B Natriuretic Peptide: 280.2 pg/mL — ABNORMAL HIGH (ref 0.0–100.0)

## 2019-03-18 LAB — TYPE AND SCREEN
ABO/RH(D): O POS
Antibody Screen: NEGATIVE

## 2019-03-18 MED ORDER — ALBUTEROL SULFATE (2.5 MG/3ML) 0.083% IN NEBU
3.0000 mL | INHALATION_SOLUTION | Freq: Four times a day (QID) | RESPIRATORY_TRACT | Status: DC | PRN
  Administered 2019-04-03: 3 mL via RESPIRATORY_TRACT
  Filled 2019-03-18 (×2): qty 3

## 2019-03-18 MED ORDER — ADULT MULTIVITAMIN W/MINERALS CH
1.0000 | ORAL_TABLET | Freq: Every day | ORAL | Status: DC
  Administered 2019-03-18: 1 via ORAL
  Filled 2019-03-18 (×2): qty 1

## 2019-03-18 MED ORDER — ACETAMINOPHEN 650 MG RE SUPP: 650.0000 mg | Freq: Four times a day (QID) | RECTAL | Status: DC | PRN

## 2019-03-18 MED ORDER — IOHEXOL 350 MG/ML SOLN
75.0000 mL | Freq: Once | INTRAVENOUS | Status: AC | PRN
  Administered 2019-03-18: 75 mL via INTRAVENOUS

## 2019-03-18 MED ORDER — ONDANSETRON HCL 4 MG PO TABS: 4.0000 mg | ORAL_TABLET | Freq: Four times a day (QID) | ORAL | Status: DC | PRN

## 2019-03-18 MED ORDER — LACTATED RINGERS IV BOLUS (SEPSIS)
1000.0000 mL | Freq: Once | INTRAVENOUS | Status: AC
  Administered 2019-03-18: 1000 mL via INTRAVENOUS

## 2019-03-18 MED ORDER — SODIUM CHLORIDE 0.9 % IV SOLN: INTRAVENOUS | Status: DC

## 2019-03-18 MED ORDER — SODIUM CHLORIDE 0.9 % IV SOLN
2.0000 g | Freq: Once | INTRAVENOUS | Status: AC
  Administered 2019-03-18: 2 g via INTRAVENOUS
  Filled 2019-03-18: qty 2

## 2019-03-18 MED ORDER — AMIODARONE HCL 200 MG PO TABS
200.0000 mg | ORAL_TABLET | Freq: Two times a day (BID) | ORAL | Status: DC
  Administered 2019-03-18 – 2019-03-30 (×23): 200 mg via ORAL
  Filled 2019-03-18 (×24): qty 1

## 2019-03-18 MED ORDER — MORPHINE SULFATE (PF) 4 MG/ML IV SOLN
4.0000 mg | INTRAVENOUS | Status: DC | PRN
  Administered 2019-03-18 – 2019-03-21 (×8): 4 mg via INTRAVENOUS
  Filled 2019-03-18 (×8): qty 1

## 2019-03-18 MED ORDER — ATORVASTATIN CALCIUM 80 MG PO TABS
80.0000 mg | ORAL_TABLET | Freq: Every day | ORAL | Status: DC
  Administered 2019-03-18 – 2019-04-06 (×20): 80 mg via ORAL
  Filled 2019-03-18 (×20): qty 1

## 2019-03-18 MED ORDER — IPRATROPIUM-ALBUTEROL 0.5-2.5 (3) MG/3ML IN SOLN
3.0000 mL | Freq: Three times a day (TID) | RESPIRATORY_TRACT | Status: DC | PRN
  Administered 2019-03-18 – 2019-03-22 (×8): 3 mL via RESPIRATORY_TRACT
  Filled 2019-03-18 (×8): qty 3

## 2019-03-18 MED ORDER — ACETAMINOPHEN 325 MG PO TABS: 650.0000 mg | ORAL_TABLET | ORAL | Status: DC | PRN

## 2019-03-18 MED ORDER — VANCOMYCIN HCL 10 G IV SOLR
1750.0000 mg | INTRAVENOUS | Status: DC
  Administered 2019-03-19 – 2019-03-22 (×4): 1750 mg via INTRAVENOUS
  Filled 2019-03-18 (×4): qty 1750

## 2019-03-18 MED ORDER — PANTOPRAZOLE SODIUM 40 MG PO TBEC
80.0000 mg | DELAYED_RELEASE_TABLET | Freq: Two times a day (BID) | ORAL | Status: DC
  Administered 2019-03-18 – 2019-04-07 (×38): 80 mg via ORAL
  Filled 2019-03-18 (×41): qty 2

## 2019-03-18 MED ORDER — METRONIDAZOLE IN NACL 5-0.79 MG/ML-% IV SOLN
500.0000 mg | Freq: Three times a day (TID) | INTRAVENOUS | Status: DC
  Administered 2019-03-18 – 2019-03-20 (×5): 500 mg via INTRAVENOUS
  Filled 2019-03-18 (×6): qty 100

## 2019-03-18 MED ORDER — ACETAMINOPHEN 325 MG PO TABS
650.0000 mg | ORAL_TABLET | Freq: Four times a day (QID) | ORAL | Status: DC | PRN
  Administered 2019-03-19: 650 mg via ORAL
  Filled 2019-03-18: qty 2

## 2019-03-18 MED ORDER — SODIUM CHLORIDE 0.9 % IV SOLN
2.0000 g | Freq: Three times a day (TID) | INTRAVENOUS | Status: DC
  Administered 2019-03-18 – 2019-03-25 (×22): 2 g via INTRAVENOUS
  Filled 2019-03-18 (×24): qty 2

## 2019-03-18 MED ORDER — TRAZODONE HCL 50 MG PO TABS
100.0000 mg | ORAL_TABLET | Freq: Every day | ORAL | Status: DC
  Administered 2019-03-18 – 2019-04-06 (×20): 100 mg via ORAL
  Filled 2019-03-18 (×22): qty 2

## 2019-03-18 MED ORDER — LACTATED RINGERS IV BOLUS (SEPSIS)
1000.0000 mL | Freq: Once | INTRAVENOUS | Status: AC
  Administered 2019-03-18: 1000 mL via INTRAVENOUS
  Filled 2019-03-18: qty 1000

## 2019-03-18 MED ORDER — VANCOMYCIN HCL IN DEXTROSE 1-5 GM/200ML-% IV SOLN: 1000.0000 mg | Freq: Once | INTRAVENOUS | Status: DC

## 2019-03-18 MED ORDER — METRONIDAZOLE IN NACL 5-0.79 MG/ML-% IV SOLN
500.0000 mg | Freq: Once | INTRAVENOUS | Status: AC
  Administered 2019-03-18: 500 mg via INTRAVENOUS
  Filled 2019-03-18: qty 100

## 2019-03-18 MED ORDER — VANCOMYCIN HCL 10 G IV SOLR
2000.0000 mg | Freq: Once | INTRAVENOUS | Status: AC
  Administered 2019-03-18: 2000 mg via INTRAVENOUS
  Filled 2019-03-18: qty 2000

## 2019-03-18 MED ORDER — CARVEDILOL 25 MG PO TABS
25.0000 mg | ORAL_TABLET | Freq: Two times a day (BID) | ORAL | Status: DC
  Administered 2019-03-18 – 2019-03-30 (×24): 25 mg via ORAL
  Filled 2019-03-18 (×3): qty 2
  Filled 2019-03-18 (×2): qty 1
  Filled 2019-03-18 (×2): qty 2
  Filled 2019-03-18: qty 1
  Filled 2019-03-18 (×2): qty 2
  Filled 2019-03-18 (×5): qty 1
  Filled 2019-03-18: qty 2
  Filled 2019-03-18 (×7): qty 1
  Filled 2019-03-18: qty 2

## 2019-03-18 MED ORDER — ONDANSETRON HCL 4 MG/2ML IJ SOLN
4.0000 mg | Freq: Four times a day (QID) | INTRAMUSCULAR | Status: DC | PRN
  Administered 2019-03-25 – 2019-03-31 (×2): 4 mg via INTRAVENOUS

## 2019-03-18 MED ORDER — ALLOPURINOL 100 MG PO TABS
100.0000 mg | ORAL_TABLET | Freq: Every day | ORAL | Status: DC
  Administered 2019-03-18 – 2019-04-07 (×18): 100 mg via ORAL
  Filled 2019-03-18 (×19): qty 1

## 2019-03-18 MED ORDER — LACTATED RINGERS IV BOLUS (SEPSIS): 500.0000 mL | Freq: Once | INTRAVENOUS | Status: DC

## 2019-03-18 NOTE — Progress Notes (Signed)
CT Surgery  Echocardiogram today reviewed norrmal biventricular fx Small 1.2 cm pericardial effusion- doesn't need drainage, no tamponade. Will need f/u echo in 72 hrs and hold anti coagulation  CTA shows expected hematoma around aortic graft which fills the space left by resection of the aorta . The CT doesnot show evidence of substernal abscess. Sternum healing appropriately  Patient may resume full liq diet

## 2019-03-18 NOTE — Progress Notes (Signed)
  Subjective: Patient examined and CT scan images reviewed Mild to moderate pericardial effusion on CT will need eval by echo - ordered Aortic graft looks intact , some surrounding expected hematoma- sternum healing Surgical incisions clean and dry Cont antibiotics and wait for blood cultures No lovenox or anticoagulation Sips po only for now  Objective: Vital signs in last 24 hours: Temp:  [98.5 F (36.9 C)-101.3 F (38.5 C)] 98.5 F (36.9 C) (03/20 0800) Pulse Rate:  [68-101] 94 (03/20 0800) Cardiac Rhythm: Sinus tachycardia (03/20 0800) Resp:  [20-25] 22 (03/20 0800) BP: (115-157)/(60-104) 157/60 (03/20 0800) SpO2:  [94 %-100 %] 100 % (03/20 0800) FiO2 (%):  [40 %] 40 % (03/20 0312)    Alert and comfortable No murmur Lungs clear    Hemodynamic parameters for last 24 hours:  nsr  Intake/Output from previous day: No intake/output data recorded. Intake/Output this shift: No intake/output data recorded.  Lab Results: Recent Labs    03/18/19 0315 03/18/19 0329  WBC 11.9*  --   HGB 8.1* 7.8*  HCT 27.0* 23.0*  PLT 455*  --    BMET:  Recent Labs    03/18/19 0315 03/18/19 0329  NA 137 136  K 4.2 4.0  CL 104  --   CO2 27  --   GLUCOSE 123*  --   BUN 13  --   CREATININE 1.02  --   CALCIUM 9.2  --     PT/INR: No results for input(s): LABPROT, INR in the last 72 hours. ABG    Component Value Date/Time   PHART 7.451 (H) 03/18/2019 0329   HCO3 24.1 03/18/2019 0329   TCO2 25 03/18/2019 0329   ACIDBASEDEF 3.0 (H) 02/24/2019 0155   O2SAT 91.0 03/18/2019 0329   CBG (last 3)  No results for input(s): GLUCAP in the last 72 hours.  Assessment/Plan: S/P  Low grade fever Pericardial effusion   LOS: 0 days    [name] [name] 03/18/2019

## 2019-03-18 NOTE — ED Triage Notes (Signed)
Arrived via EMS for chest pain with Hx of CABBG last month

## 2019-03-18 NOTE — ED Provider Notes (Signed)
[facility] EMERGENCY DEPARTMENT Provider Note   CSN: [id number] Arrival date & time: 03/18/19  0256    History   Chief Complaint Chief Complaint  Patient presents with   Chest Pain    HPI RASHIDI LOH is a 58 y.o. male.     The history is provided by the EMS personnel. The history is limited by the condition of the patient.  Chest Pain  Pain location:  Substernal area Pain radiates to:  Does not radiate Pain severity:  Severe Onset quality:  Gradual Timing:  Intermittent Progression:  Worsening Chronicity:  Recurrent Context: at rest   Context: not drug use, not eating and not trauma   Context comment:  Status post CABG on 02/23/2019 Relieved by:  Nothing Worsened by:  Nothing Ineffective treatments:  None tried Associated symptoms: fever and shortness of breath   Associated symptoms: no altered mental status, no anxiety, no back pain, no claudication, no cough, no diaphoresis, no fatigue, no palpitations, no syncope, no vomiting and no weakness   Risk factors: surgery     Past Medical History:  Diagnosis Date   Adenomatous colon polyp 2009   Alcohol abuse    abstinent since 1992   Arthritis    back   Asthma    Barrett's esophagus 05/22/2015   Chronic back pain    COPD (chronic obstructive pulmonary disease) ([place])    Coronary artery disease    a. cath in 2010 showing 20% distal LM, 70-80% Ost LAD, 30% OM, and 40% RCA b. low-risk NST in 2014 and 2016   GERD (gastroesophageal reflux disease)    takes Nexium daily   Gout    takes ALlopurinol daily   Hemorrhoids    History of bronchitis 2015   History of kidney stones    Hyperlipidemia    was given a script a month ago but scared to take it.Medical Md is aware   Hypertension    takes Losartan,Metoprolol,and Amlodipine daily   Joint pain    Lymphocytic colitis 2009   Myocardial infarction [facility]) 2010   "light" (admit for CP 11/2009; ruled out for MI but had 70-80%  ostail LAD stenosis and treated medically following NL perfusion study)   NASH (nonalcoholic steatohepatitis)    Peripheral vascular disease ([place])    Personal history of colonic polyps 07/20/2002   Diminutive adenomas (3) 06/2002 diminutive adenoma (1) 2011    Pre-diabetes    Stroke [facility])    TIA (transient ischemic attack)    Urinary urgency     Patient Active Problem List   Diagnosis Date Noted   Abscess of sternal region 03/18/2019   Hx of CABG    PAF (paroxysmal atrial fibrillation) ([place])    S/P ascending aortic aneurysm repair 02/24/2019   Aortic dissection ([place]) 02/23/2019   Hyperglycemia 02/22/2019   Sepsis ([place]) 11/24/2018   Steroid-induced hyperglycemia 11/24/2018   NASH (nonalcoholic steatohepatitis) 11/24/2018   COPD (chronic obstructive pulmonary disease) ([place]) 11/24/2018   Community acquired pneumonia 11/24/2018   Community acquired pneumonia of right middle lobe of lung ([place]) 11/23/2018   Chronic pain of left knee 03/01/2018   Derangement of posterior horn of medial meniscus, left 04/24/2017   Chronic cholecystitis 08/03/2015   Barrett's esophagus 05/22/2015   Abnormal findings on radiological examination of gastrointestinal tract 05/02/2015   RUQ abdominal pain 05/02/2015   Steatosis 05/02/2015   History of CVA (cerebrovascular accident) 10/11/2013   CVA (cerebral infarction) 05/26/2012   CAD (coronary artery disease) 05/26/2012  HEMORRHOIDS-INTERNAL 03/12/2010   GOUT 08/25/2007   Essential hypertension 08/25/2007   GERD 08/25/2007   History of adenomatous polyp of colon 07/20/2002    Past Surgical History:  Procedure Laterality Date   ANEURYSM COILING  12/2010   cerebral   BAND HEMORRHOIDECTOMY  2003   at sigmoidoscopy   CARDIAC CATHETERIZATION  2010   CHOLECYSTECTOMY  08/03/2015   CHOLECYSTECTOMY N/A 08/03/2015   Procedure: LAPAROSCOPIC CHOLECYSTECTOMY WITH INTRAOPERATIVE CHOLANGIOGRAM;  Surgeon: Fanny Skates, MD;   Location: [place];  Service: General;  Laterality: N/A;   COLONOSCOPY  multiple   CORONARY ARTERY BYPASS GRAFT N/A 02/23/2019   Procedure: CORONARY ARTERY BYPASS GRAFTING (CABG) x 1; Using Endoscopically Harvested Right Leg Greater Saphenous Vein Graft (SVG); SVG to RCA;  Surgeon: Ivin Poot, MD;  Location: [place];  Service: Open Heart Surgery;  Laterality: N/A;   CORONARY STENT INTERVENTION N/A 02/23/2019   Procedure: CORONARY STENT INTERVENTION;  Surgeon: Martinique, Peter M, MD;  Location: [place] CV LAB;  Service: Cardiovascular;  Laterality: N/A;   ESOPHAGOGASTRODUODENOSCOPY     KNEE ARTHROSCOPY WITH MEDIAL MENISECTOMY Left 05/20/2017   Procedure: LEFT KNEE ARTHROSCOPY WITH PARTIAL MEDIAL MENISCECTOMY;  Surgeon: Leandrew Koyanagi, MD;  Location: [place];  Service: Orthopedics;  Laterality: Left;   LEFT HEART CATH AND CORONARY ANGIOGRAPHY N/A 02/23/2019   Procedure: LEFT HEART CATH AND CORONARY ANGIOGRAPHY;  Surgeon: Martinique, Peter M, MD;  Location: [place] CV LAB;  Service: Cardiovascular;  Laterality: N/A;   LUMBAR SPINE SURGERY     x 2   NASAL SINUS SURGERY     REPAIR OF ACUTE ASCENDING THORACIC AORTIC DISSECTION N/A 02/23/2019   Procedure: REPAIR OF TYPE A  - ACUTE ASCENDING THORACIC AORTIC DISSECTION, using Hemashield Platinum Woven Double Velour Vascular Graft (D: 39m, L: 30cm);  Surgeon: VPrescott Gum PCollier Salina MD;  [facility]  Service: Vascular;  Laterality: N/A;   TEE WITHOUT CARDIOVERSION  05/28/2012   Procedure: TRANSESOPHAGEAL ECHOCARDIOGRAM (TEE);  Surgeon: BLelon Perla MD;  [facility]  Service: Cardiovascular;  Laterality: N/A;   TEE WITHOUT CARDIOVERSION N/A 02/23/2019   Procedure: TRANSESOPHAGEAL ECHOCARDIOGRAM (TEE);  Surgeon: VPrescott Gum PCollier Salina MD;  [place]  Service: Open Heart Surgery;  Laterality: N/A;        Home Medications    Prior to Admission medications   Medication Sig Start Date End Date Taking?  Authorizing Provider  acetaminophen (TYLENOL) 325 MG tablet Take 2 tablets (650 mg total) by mouth every 4 (four) hours as needed for headache or mild pain. 03/05/19  Yes Roddenberry, Myron G, PA-C  albuterol (PROVENTIL HFA;VENTOLIN HFA) 108 (90 Base) MCG/ACT inhaler Inhale 1 puff into the lungs every 6 (six) hours as needed for wheezing or shortness of breath.    Yes [provider]  allopurinol (ZYLOPRIM) 100 MG tablet Take 100 mg by mouth daily.    Yes [provider]  amiodarone (PACERONE) 200 MG tablet Take 2 tablets (400 mg total) by mouth every 12 (twelve) hours. Take 2 tablets by mouth twice daily for 7 days then 1 tablet by mouth twice daily thereafter. Patient taking differently: Take 200 mg by mouth 2 (two) times daily.  03/05/19  Yes Roddenberry, MArlis Porta PA-C  amLODipine (NORVASC) 10 MG tablet Take 1 tablet (10 mg total) by mouth daily. 03/06/19  Yes RAntony Odea PA-C  aspirin 325 MG tablet Take 325 mg by mouth daily.    Yes [provider]  atorvastatin (  LIPITOR) 80 MG tablet Take 1 tablet (80 mg total) by mouth daily at 6 PM. 03/05/19  Yes Roddenberry, Myron G, PA-C  carvedilol (COREG) 25 MG tablet Take 1 tablet (25 mg total) by mouth 2 (two) times daily with a meal. 03/05/19  Yes Roddenberry, Myron G, PA-C  esomeprazole (NEXIUM) 40 MG capsule Take 40 mg by mouth 2 (two) times daily.   Yes [provider]  ipratropium-albuterol (DUONEB) 0.5-2.5 (3) MG/3ML SOLN Take 3 mLs by nebulization every 8 (eight) hours as needed (wheezing, shortness of breath). 11/29/18  Yes [place], Clanford L, MD  losartan (COZAAR) 100 MG tablet Take 1 tablet (100 mg total) by mouth daily. 03/06/19  Yes Roddenberry, Arlis Porta, PA-C  Multiple Vitamin (MULITIVITAMIN WITH MINERALS) TABS Take 1 tablet by mouth daily.   Yes [provider]  nitroGLYCERIN (NITROSTAT) 0.4 MG SL tablet Place 0.4 mg under the tongue every 5 (five) minutes as needed. For chest pain   Yes [provider]  Omega-3 Fatty Acids (FISH OIL) 1200 MG CPDR Take 1,200 mg by mouth daily.   Yes [provider]  traZODone (DESYREL) 100 MG tablet Take 100 mg by mouth at bedtime.    Yes [provider]    Family History Family History  Problem Relation Age of Onset   Liver disease Mother    Liver cancer Mother    Breast cancer Mother    Cancer Mother    Hypertension Mother    Heart attack Mother    Cancer Father    Heart disease Father        before age 76   Hyperlipidemia Father    Hypertension Father    Heart attack Father    Heart disease Other        multiple family members on maternal and paternal side of family   Diabetes Sister    Hyperlipidemia Sister    Hypertension Sister    Diabetes Paternal Grandmother    Hypertension Brother    Colon cancer Neg Hx     Social History Social History   Tobacco Use   Smoking status: Former Smoker    Packs/day: 1.00    Years: 4.00    Pack years: 4.00    Types: Cigarettes    Last attempt to quit: 12/22/2018    Years since quitting: 0.2   Smokeless tobacco: Never Used  Substance Use Topics   Alcohol use: No    Alcohol/week: 0.0 standard drinks    Comment: no alcohol in 25yr    Drug use: No     Allergies   Penicillins   Review of Systems Review of Systems  Constitutional: Positive for fever. Negative for diaphoresis and fatigue.  Respiratory: Positive for shortness of breath. Negative for cough and choking.   Cardiovascular: Positive for chest pain. Negative for palpitations, claudication, leg swelling and syncope.  Gastrointestinal: Negative for vomiting.  Musculoskeletal: Negative for back pain.  Neurological: Negative for weakness.     Physical Exam Updated Vital Signs BP 137/90    Pulse (!) 101    Temp (!) 101.3 F (38.5 C) (Oral)    Resp 20    SpO2 100%   Physical Exam Vitals signs and nursing note reviewed.  Constitutional:      Appearance: Normal appearance.  He is not toxic-appearing.  HENT:     Head: Normocephalic and atraumatic.     Nose: Nose normal.     Mouth/Throat:     Mouth: Mucous membranes are  moist.     Pharynx: Oropharynx is clear.  Eyes:     Conjunctiva/sclera: Conjunctivae normal.     Pupils: Pupils are equal, round, and reactive to light.  Neck:     Musculoskeletal: Normal range of motion and neck supple.  Cardiovascular:     Rate and Rhythm: Regular rhythm. Tachycardia present.     Pulses: Normal pulses.     Heart sounds: Normal heart sounds.  Pulmonary:     Effort: Tachypnea present.     Breath sounds: Decreased air movement present.  Abdominal:     General: Abdomen is flat. Bowel sounds are normal.     Tenderness: There is no abdominal tenderness. There is no guarding.  Musculoskeletal: Normal range of motion.  Skin:    General: Skin is warm and dry.     Capillary Refill: Capillary refill takes less than 2 seconds.  Neurological:     General: No focal deficit present.     Mental Status: He is alert and oriented to person, place, and time.     Deep Tendon Reflexes: Reflexes normal.  Psychiatric:        Mood and Affect: Mood normal.        Behavior: Behavior normal.      ED Treatments / Results  Labs (all labs ordered are listed, but only abnormal results are displayed) Labs Reviewed  CBC WITH DIFFERENTIAL/PLATELET - Abnormal; Notable for the following components:      Result Value   WBC 11.9 (*)    RBC 2.96 (*)    Hemoglobin 8.1 (*)    HCT 27.0 (*)    Platelets 455 (*)    Neutro Abs 8.8 (*)    Monocytes Absolute 1.1 (*)    Abs Immature Granulocytes 0.08 (*)    All other components within normal limits  BRAIN NATRIURETIC PEPTIDE - Abnormal; Notable for the following components:   B Natriuretic Peptide 280.2 (*)    All other components within normal limits  LACTIC ACID, PLASMA - Abnormal; Notable for the following components:   Lactic Acid, Venous 2.9 (*)    All other components within normal limits   COMPREHENSIVE METABOLIC PANEL - Abnormal; Notable for the following components:   Glucose, Bld 123 (*)    Albumin 2.6 (*)    All other components within normal limits  POCT I-STAT 7, (LYTES, BLD GAS, ICA,H+H) - Abnormal; Notable for the following components:   pH, Arterial 7.451 (*)    pO2, Arterial 62.0 (*)    HCT 23.0 (*)    Hemoglobin 7.8 (*)    All other components within normal limits  CULTURE, BLOOD (ROUTINE X 2)  CULTURE, BLOOD (ROUTINE X 2)  URINE CULTURE  RESPIRATORY PANEL BY PCR  URINALYSIS, ROUTINE W REFLEX MICROSCOPIC  LACTIC ACID, PLASMA  BLOOD GAS, ARTERIAL  INFLUENZA PANEL BY PCR (TYPE A & B)  I-STAT TROPONIN, ED    EKG EKG Interpretation  Date/Time:  Friday March 18 2019 03:10:30 EDT Ventricular Rate:  98 PR Interval:    QRS Duration: 143 QT Interval:  380 QTC Calculation: 486 R Axis:   -23 Text Interpretation:  Sinus tachycardia Atrial premature complexes Right bundle branch block Confirmed by Randal Buba, Krishika Bugge ([id number]) on 03/18/2019 3:17:28 AM   Radiology Dg Chest Portable 1 View  Result Date: 03/18/2019 CLINICAL DATA:  Pain EXAM: PORTABLE CHEST 1 VIEW COMPARISON:  03/03/2019 FINDINGS: Prior CABG. Cardiomegaly. No confluent opacities, effusions or edema. No acute bony abnormality. IMPRESSION: Cardiomegaly.  No active disease.  Electronically Signed   By: Rolm Baptise M.D.   On: 03/18/2019 03:36   Ct Angio Chest Aorta W/cm &/or Wo/cm  Result Date: 03/18/2019 CLINICAL DATA:  Stiffness in the back beginning a few hours ago. Recent ascending aortic dissection repair EXAM: CT ANGIOGRAPHY CHEST WITH CONTRAST TECHNIQUE: Multidetector CT imaging of the chest was performed using the standard protocol during bolus administration of intravenous contrast. Multiplanar CT image reconstructions and MIPs were obtained to evaluate the vascular anatomy. CONTRAST:  35m OMNIPAQUE IOHEXOL 350 MG/ML SOLN COMPARISON:  Chest CTA 02/23/2019. There is some kind of archive error, only  coronal reformats are available from that study. FINDINGS: Cardiovascular: Recent ascending aortic repair with patent graft. No cardiomegaly. There is a moderate pericardial effusion that is lobulated in places, up to 14 mm in thickness. Substernal low-density with perceptible enhancing wall you have been in the arterial phase. On axial slices the collection measures up to 4 cm in maximal diameter. The mediastinal fat and presternal fat is stranded. Wound is not dehiscent and there is no clear involucrum or other osteomyelitis changes, although recent surgery. There does appear to be low-density rim enhancing collection rightward from the graft measuring up to 4 cm. Mural irregularity along the anterior and right aspect of the proximal graft is favored to reflect postoperative change. Single-vessel CABG to the right coronary circulation that is patent proximally. Pulmonary arteries are patent. Dissection along the arch and descending aorta with termination before the aortic hiatus. There is mild opacification of the false lumen that is progressed. When compared on coronal images the dissection has mildly propagated in the descending segment. Mediastinum/Nodes: Adenopathy considered reactive. Lungs/Pleura: There is no edema, consolidation, effusion, or pneumothorax. Upper Abdomen: No acute finding Musculoskeletal: Sternal findings described above. These results were were discussed via telephone at the time of interpretation on 03/18/2019 at 5:56 am with Dr. JJennette Kettle Review of the MIP images confirms the above findings. IMPRESSION: 1. Rim enhancing fluid collections below the sternotomy and rightward of the ascending aortic graft, history suggesting abscess. Moderate pericardial effusion. 2. Patent ascending aortic graft and right coronary graft. Mural irregularity along the proximal aortic graft is likely postoperative. If location is discordant with operative findings a follow-up gated study may be used to  exclude an early paravalvular leak. 3. Known descending aortic dissection with increased enhancement within the false lumen. There has been mild propagation of the dissection towards the aortic hiatus. Electronically Signed   By: JMonte FantasiaM.D.   On: 03/18/2019 06:01    Procedures Procedures (including critical care time)  Medications Ordered in ED Medications  metroNIDAZOLE (FLAGYL) IVPB 500 mg (has no administration in time range)  aztreonam (AZACTAM) 2 g in sodium chloride 0.9 % 100 mL IVPB (has no administration in time range)  vancomycin (VANCOCIN) 1,750 mg in sodium chloride 0.9 % 500 mL IVPB (has no administration in time range)  morphine 4 MG/ML injection 4 mg (4 mg Intravenous Given 03/18/19 0613)  acetaminophen (TYLENOL) tablet 650 mg (has no administration in time range)    Or  acetaminophen (TYLENOL) suppository 650 mg (has no administration in time range)  ondansetron (ZOFRAN) tablet 4 mg (has no administration in time range)    Or  ondansetron (ZOFRAN) injection 4 mg (has no administration in time range)  0.9 %  sodium chloride infusion (has no administration in time range)  amiodarone (PACERONE) tablet 200 mg (has no administration in time range)  atorvastatin (LIPITOR) tablet 80 mg (has no administration  in time range)  allopurinol (ZYLOPRIM) tablet 100 mg (has no administration in time range)  albuterol (PROVENTIL) (2.5 MG/3ML) 0.083% nebulizer solution 3 mL (has no administration in time range)  ipratropium-albuterol (DUONEB) 0.5-2.5 (3) MG/3ML nebulizer solution 3 mL (has no administration in time range)  carvedilol (COREG) tablet 25 mg (has no administration in time range)  multivitamin with minerals tablet 1 tablet (has no administration in time range)  traZODone (DESYREL) tablet 100 mg (has no administration in time range)  pantoprazole (PROTONIX) EC tablet 80 mg (has no administration in time range)  aztreonam (AZACTAM) 2 g in sodium chloride 0.9 % 100 mL IVPB  (0 g Intravenous Stopped 03/18/19 0407)  metroNIDAZOLE (FLAGYL) IVPB 500 mg (0 mg Intravenous Stopped 03/18/19 0514)  vancomycin (VANCOCIN) 2,000 mg in sodium chloride 0.9 % 500 mL IVPB (2,000 mg Intravenous New Bag/Given 03/18/19 0407)  iohexol (OMNIPAQUE) 350 MG/ML injection 75 mL (75 mLs Intravenous Contrast Given 03/18/19 0524)     MDM Reviewed: previous chart, nursing note and vitals Reviewed previous: labs Interpretation: labs, ECG and x-ray (negative troponin, anemia, elevated lactate, cardiomegaly) Consults: admitting MD (CT surgery: case d/w Dr. Cyndia Bent, Dr. Prescott Gum to round on the patient in the am )  CRITICAL CARE Performed by: Dymon Summerhill K Masiya Claassen-Rasch Total critical care time: 45 minutes Critical care time was exclusive of separately billable procedures and treating other patients. Critical care was necessary to treat or prevent imminent or life-threatening deterioration. Critical care was time spent personally by me on the following activities: development of treatment plan with patient and/or surrogate as well as nursing, discussions with consultants, evaluation of patient's response to treatment, examination of patient, obtaining history from patient or surrogate, ordering and performing treatments and interventions, ordering and review of laboratory studies, ordering and review of radiographic studies, pulse oximetry and re-evaluation of patient's condition.  Final Clinical Impressions(s) / ED Diagnoses   Final diagnoses:  Sepsis, due to unspecified organism, unspecified whether acute organ dysfunction present [facility])  Chest pain, unspecified type    Admit to medicine   Gasper Hopes, MD 03/18/19 [id number]

## 2019-03-18 NOTE — ED Notes (Signed)
Lab draw attempt unsuccessful

## 2019-03-18 NOTE — Progress Notes (Signed)
PROGRESS NOTE    [name] [name]  WVP:[id number] DOB: 10/25/1961 DOA: 03/18/2019 PCP: [name] Frees, MD   Brief Narrative:  HPI on 03/18/2019 by [name] [name] is a 58 y.o. male with medical history significant of COPD, PAF on amiodarone, CAD, admitted for unstable angina at end of Feb, underwent LHC with PCI to 90% RCA lesion, unfortunately this ended up being complicated by type A aortic dissection.  Patient underwent repair of Type A dissection with single vessel RCA CABG (with SVG) and was discharged home.  Ironically he was scheduled to see [name] [name] in office follow up today.  For the past 3 days he has had progressively worsening central CP and SOB.  EMS ultimately called this morning.  Assessment & Plan   Patient made earlier today by [name] [name].  See H&P for details.  Sepsis secondary to possible sternal abscess -Presented with fever, tachycardia, tachypnea -CTA chest shows rim-enhancing fluid collections below the sternotomy and rightward of the a sending aortic graft, history suggesting abscess.  Moderate pericardial effusion -Influenza PCR as well as respiratory viral panel unremarkable -Continue vancomycin, aztreonam (penicillin allergy), Flagyl -Cardiothoracic surgery, [name] [name] consulted and appreciated-continue antibiotics at this time, will order echocardiogram -Blood cultures pending -Continue IV fluids, continue pain control  CAD -s/p 1 vessel CABG -Troponin negative -Given the above, suspect chest pain is likely secondary to possible abscess -Continue to monitor -Placed on pain control  Paroxysmal atrial fibrillation -Continue amiodarone, Coreg -No anticoagulants at this time  NASH -Chronic, currently stable  Essential hypertension -Amlodipine and losartan currently held -Continue Coreg  DVT Prophylaxis  SCDs  Code Status: Full  Family Communication: Wife at bedside  Disposition Plan: Admitted. Pending  further recommendations from CTS. Likely home when stable.  Consultants Cardiothoracic surgery, [name] [name]  Procedures  None  Antibiotics   Anti-infectives (From admission, onward)   Start     Dose/Rate Route Frequency Ordered Stop   03/19/19 0500  vancomycin (VANCOCIN) 1,750 mg in sodium chloride 0.9 % 500 mL IVPB     1,750 mg 250 mL/hr over 120 Minutes Intravenous Every 24 hours 03/18/19 0525     03/18/19 1400  aztreonam (AZACTAM) 2 g in sodium chloride 0.9 % 100 mL IVPB     2 g 200 mL/hr over 30 Minutes Intravenous Every 8 hours 03/18/19 0525     03/18/19 1200  metroNIDAZOLE (FLAGYL) IVPB 500 mg     500 mg 100 mL/hr over 60 Minutes Intravenous Every 8 hours 03/18/19 0511     03/18/19 0330  aztreonam (AZACTAM) 2 g in sodium chloride 0.9 % 100 mL IVPB     2 g 200 mL/hr over 30 Minutes Intravenous  Once 03/18/19 0315 03/18/19 0407   03/18/19 0330  metroNIDAZOLE (FLAGYL) IVPB 500 mg     500 mg 100 mL/hr over 60 Minutes Intravenous  Once 03/18/19 0315 03/18/19 0514   03/18/19 0330  vancomycin (VANCOCIN) IVPB 1000 mg/200 mL premix  Status:  Discontinued     1,000 mg 200 mL/hr over 60 Minutes Intravenous  Once 03/18/19 0315 03/18/19 0317   03/18/19 0330  vancomycin (VANCOCIN) 2,000 mg in sodium chloride 0.9 % 500 mL IVPB     2,000 mg 250 mL/hr over 120 Minutes Intravenous  Once 03/18/19 0317 03/18/19 0701      Subjective:   [name] [name] seen and examined today.  Complains of pain, worse with deep breathing, cough, movement.  Objective:   Vitals:  03/18/19 0515 03/18/19 0600 03/18/19 0615 03/18/19 0800  BP: (!) 115/104 (!) 151/79 (!) 157/62 (!) 157/60  Pulse: 89 95 91 94  Resp: (!) 24 (!) 21 (!) 25 (!) 22  Temp:    98.5 F (36.9 C)  TempSrc:    Oral  SpO2: 96% 99% 99% 100%  Weight:    107 kg  Height:    5' 8"  (1.727 m)   No intake or output data in the 24 hours ending 03/18/19 0943 Filed Weights   03/18/19 0800  Weight: 107 kg    Exam  General: Well  developed, well nourished, NAD, appears stated age  HEENT: NCAT,  mucous membranes moist.   Neck: Supple  Cardiovascular: S1 S2 auscultated, no rubs, murmurs or gallops. Regular rate and rhythm.  Respiratory: Clear to auscultation bilaterally with equal chest rise  Abdomen: Soft, nontender, nondistended, + bowel sounds  Extremities: warm dry without cyanosis clubbing or edema  Neuro: AAOx3, nonfocal  Skin: Without rashes exudates or nodules- sternotomy incision appears healed  Psych: Normal affect and demeanor with intact judgement and insight   Data Reviewed: I have personally reviewed following labs and imaging studies  CBC: Recent Labs  Lab 03/18/19 0315 03/18/19 0329  WBC 11.9*  --   NEUTROABS 8.8*  --   HGB 8.1* 7.8*  HCT 27.0* 23.0*  MCV 91.2  --   PLT 455*  --    Basic Metabolic Panel: Recent Labs  Lab 03/18/19 0315 03/18/19 0329  NA 137 136  K 4.2 4.0  CL 104  --   CO2 27  --   GLUCOSE 123*  --   BUN 13  --   CREATININE 1.02  --   CALCIUM 9.2  --    GFR: Estimated Creatinine Clearance: 93.6 mL/min (by C-G formula based on SCr of 1.02 mg/dL). Liver Function Tests: Recent Labs  Lab 03/18/19 0315  AST 19  ALT 29  ALKPHOS 106  BILITOT 0.5  PROT 6.8  ALBUMIN 2.6*   No results for input(s): LIPASE, AMYLASE in the last 168 hours. No results for input(s): AMMONIA in the last 168 hours. Coagulation Profile: Recent Labs  Lab 03/18/19 0853  INR 1.1   Cardiac Enzymes: No results for input(s): CKTOTAL, CKMB, CKMBINDEX, TROPONINI in the last 168 hours. BNP (last 3 results) No results for input(s): PROBNP in the last 8760 hours. HbA1C: No results for input(s): HGBA1C in the last 72 hours. CBG: No results for input(s): GLUCAP in the last 168 hours. Lipid Profile: No results for input(s): CHOL, HDL, LDLCALC, TRIG, CHOLHDL, LDLDIRECT in the last 72 hours. Thyroid Function Tests: No results for input(s): TSH, T4TOTAL, FREET4, T3FREE, THYROIDAB in  the last 72 hours. Anemia Panel: No results for input(s): VITAMINB12, FOLATE, FERRITIN, TIBC, IRON, RETICCTPCT in the last 72 hours. Urine analysis:    Component Value Date/Time   COLORURINE YELLOW 03/18/2019 0455   APPEARANCEUR CLEAR 03/18/2019 0455   LABSPEC 1.013 03/18/2019 0455   PHURINE 5.0 03/18/2019 0455   GLUCOSEU NEGATIVE 03/18/2019 0455   HGBUR NEGATIVE 03/18/2019 0455   BILIRUBINUR NEGATIVE 03/18/2019 0455   KETONESUR NEGATIVE 03/18/2019 0455   PROTEINUR NEGATIVE 03/18/2019 0455   UROBILINOGEN 0.2 08/03/2015 0608   NITRITE NEGATIVE 03/18/2019 0455   LEUKOCYTESUR NEGATIVE 03/18/2019 0455   Sepsis Labs: @LABRCNTIP (procalcitonin:4,lacticidven:4)  ) Recent Results (from the past 240 hour(s))  Respiratory Panel by PCR     Status: None   Collection Time: 03/18/19  5:54 AM  Result Value Ref  Range Status   Adenovirus NOT DETECTED NOT DETECTED Final   Coronavirus 229E NOT DETECTED NOT DETECTED Final    Comment: (NOTE) The Coronavirus on the Respiratory Panel, DOES NOT test for the novel  Coronavirus (2019 nCoV)    Coronavirus HKU1 NOT DETECTED NOT DETECTED Final   Coronavirus NL63 NOT DETECTED NOT DETECTED Final   Coronavirus OC43 NOT DETECTED NOT DETECTED Final   Metapneumovirus NOT DETECTED NOT DETECTED Final   Rhinovirus / Enterovirus NOT DETECTED NOT DETECTED Final   Influenza A NOT DETECTED NOT DETECTED Final   Influenza B NOT DETECTED NOT DETECTED Final   Parainfluenza Virus 1 NOT DETECTED NOT DETECTED Final   Parainfluenza Virus 2 NOT DETECTED NOT DETECTED Final   Parainfluenza Virus 3 NOT DETECTED NOT DETECTED Final   Parainfluenza Virus 4 NOT DETECTED NOT DETECTED Final   Respiratory Syncytial Virus NOT DETECTED NOT DETECTED Final   Bordetella pertussis NOT DETECTED NOT DETECTED Final   Chlamydophila pneumoniae NOT DETECTED NOT DETECTED Final   Mycoplasma pneumoniae NOT DETECTED NOT DETECTED Final    Comment: Performed at [facility], [place] [address] [place], [place] [address]      Radiology Studies: Dg Chest Portable 1 View  Result Date: 03/18/2019 CLINICAL DATA:  Pain EXAM: PORTABLE CHEST 1 VIEW COMPARISON:  03/03/2019 FINDINGS: Prior CABG. Cardiomegaly. No confluent opacities, effusions or edema. No acute bony abnormality. IMPRESSION: Cardiomegaly.  No active disease. Electronically Signed   By: Rolm Baptise M.D.   On: 03/18/2019 03:36   Ct Angio Chest Aorta W/cm &/or Wo/cm  Result Date: 03/18/2019 CLINICAL DATA:  Stiffness in the back beginning a few hours ago. Recent ascending aortic dissection repair EXAM: CT ANGIOGRAPHY CHEST WITH CONTRAST TECHNIQUE: Multidetector CT imaging of the chest was performed using the standard protocol during bolus administration of intravenous contrast. Multiplanar CT image reconstructions and MIPs were obtained to evaluate the vascular anatomy. CONTRAST:  65m OMNIPAQUE IOHEXOL 350 MG/ML SOLN COMPARISON:  Chest CTA 02/23/2019. There is some kind of archive error, only coronal reformats are available from that study. FINDINGS: Cardiovascular: Recent ascending aortic repair with patent graft. No cardiomegaly. There is a moderate pericardial effusion that is lobulated in places, up to 14 mm in thickness. Substernal low-density with perceptible enhancing wall you have been in the arterial phase. On axial slices the collection measures up to 4 cm in maximal diameter. The mediastinal fat and presternal fat is stranded. Wound is not dehiscent and there is no clear involucrum or other osteomyelitis changes, although recent surgery. There does appear to be low-density rim enhancing collection rightward from the graft measuring up to 4 cm. Mural irregularity along the anterior and right aspect of the proximal graft is favored to reflect postoperative change. Single-vessel CABG to the right coronary circulation that is patent proximally. Pulmonary arteries are patent. Dissection along the arch and descending aorta with  termination before the aortic hiatus. There is mild opacification of the false lumen that is progressed. When compared on coronal images the dissection has mildly propagated in the descending segment. Mediastinum/Nodes: Adenopathy considered reactive. Lungs/Pleura: There is no edema, consolidation, effusion, or pneumothorax. Upper Abdomen: No acute finding Musculoskeletal: Sternal findings described above. These results were were discussed via telephone at the time of interpretation on 03/18/2019 at 5:56 am with Dr. JJennette [name] Review of the MIP images confirms the above findings. IMPRESSION: 1. Rim enhancing fluid collections below the sternotomy and rightward of the ascending aortic graft, history suggesting abscess. Moderate pericardial  effusion. 2. Patent ascending aortic graft and right coronary graft. Mural irregularity along the proximal aortic graft is likely postoperative. If location is discordant with operative findings a follow-up gated study may be used to exclude an early paravalvular leak. 3. Known descending aortic dissection with increased enhancement within the false lumen. There has been mild propagation of the dissection towards the aortic hiatus. Electronically Signed   By: Monte Fantasia M.D.   On: 03/18/2019 06:01     Scheduled Meds: . allopurinol  100 mg Oral Daily  . amiodarone  200 mg Oral BID  . atorvastatin  80 mg Oral q1800  . carvedilol  25 mg Oral BID WC  . multivitamin with minerals  1 tablet Oral Daily  . pantoprazole  80 mg Oral BID  . traZODone  100 mg Oral QHS   Continuous Infusions: . sodium chloride    . aztreonam    . lactated ringers     And  . lactated ringers    . metronidazole    . [START ON 03/19/2019] vancomycin       LOS: 0 days   Time Spent in minutes   30 minutes  Osmond Steckman D.O. on 03/18/2019 at 9:43 AM  Between 7am to 7pm - Please see pager noted on [email or web address]  After 7pm go to [email or web address]  And look for the night coverage person  covering for me after hours  [facility] Office  [phone]

## 2019-03-18 NOTE — H&P (Signed)
History and Physical    [name] [name] DOB: February 18, 1961 DOA: 03/18/2019  PCP: Shirline Frees, MD  Patient coming from: Home  I have personally briefly reviewed patient's old medical records in [place]  Chief Complaint: Chest pain, SOB  HPI: [name] [name] is a 58 y.o. male with medical history significant of [name], PAF on amiodarone, CAD, admitted for unstable angina at end of Feb, underwent LHC with PCI to 90% RCA lesion, unfortunately this ended up being complicated by type A aortic dissection.  Patient underwent repair of Type A dissection with single vessel RCA CABG (with SVG) and was discharged home.  Ironically he was scheduled to see Dr. Prescott Gum in office follow up today.  For the past 3 days he has had progressively worsening central CP and SOB.  EMS ultimately called this morning.   ED Course: Tm 101.3, sating well but on 4L via [place].  Lactate 2.9, WBC 11.9.  CXR neg.  HGB 8.1 today, was 8.1 on discharge 3/7.  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  Trop neg.  Dr. Cyndia Bent was consulted and will let Dr. Prescott Gum know to see patient this morning.  Since that time a CT aortogram was obtained in the ED.  CT aortogram reveals: "Rim enhancing fluid collections below the sternotomy and rightward of the ascending aortic graft, history suggesting abscess.  Moderate pericardial effusion."   Review of Systems: As per HPI otherwise 10 point review of systems negative.   Past Medical History:  Diagnosis Date   Adenomatous colon polyp 2009   Alcohol abuse    abstinent since 1992   Arthritis    back   Asthma    Barrett's esophagus 05/22/2015   Chronic back pain    [name] (chronic obstructive pulmonary disease) ([place])    Coronary artery disease    a. cath in 2010 showing 20% distal LM, 70-80% Ost LAD, 30% OM, and 40% RCA b. low-risk NST in 2014 and 2016   GERD (gastroesophageal reflux disease)    takes Nexium daily   Gout    takes ALlopurinol daily    Hemorrhoids    History of bronchitis 2015   History of kidney stones    Hyperlipidemia    was given a script a month ago but scared to take it.Medical Md is aware   Hypertension    takes Losartan,Metoprolol,and Amlodipine daily   Joint pain    Lymphocytic colitis 2009   Myocardial infarction [facility]) 2010   "light" (admit for CP 11/2009; ruled out for MI but had 70-80% ostail LAD stenosis and treated medically following NL perfusion study)   NASH (nonalcoholic steatohepatitis)    Peripheral vascular disease ([place])    Personal history of colonic polyps 07/20/2002   Diminutive adenomas (3) 06/2002 diminutive adenoma (1) 2011    Pre-diabetes    Stroke [facility])    TIA (transient ischemic attack)    Urinary urgency     Past Surgical History:  Procedure Laterality Date   ANEURYSM COILING  12/2010   cerebral   BAND HEMORRHOIDECTOMY  2003   at sigmoidoscopy   CARDIAC CATHETERIZATION  2010   CHOLECYSTECTOMY  08/03/2015   CHOLECYSTECTOMY N/A 08/03/2015   Procedure: LAPAROSCOPIC CHOLECYSTECTOMY WITH INTRAOPERATIVE CHOLANGIOGRAM;  Surgeon: Fanny Skates, MD;  Location: [place];  Service: General;  Laterality: N/A;   COLONOSCOPY  multiple   CORONARY ARTERY BYPASS GRAFT N/A 02/23/2019   Procedure: CORONARY ARTERY BYPASS GRAFTING (CABG) x 1; Using Endoscopically Harvested Right Leg Greater Saphenous  Vein Graft (SVG); SVG to RCA;  Surgeon: Ivin Poot, MD;  Location: [place];  Service: Open Heart Surgery;  Laterality: N/A;   CORONARY STENT INTERVENTION N/A 02/23/2019   Procedure: CORONARY STENT INTERVENTION;  Surgeon: Martinique, Peter M, MD;  Location: [place] CV LAB;  Service: Cardiovascular;  Laterality: N/A;   ESOPHAGOGASTRODUODENOSCOPY     KNEE ARTHROSCOPY WITH MEDIAL MENISECTOMY Left 05/20/2017   Procedure: LEFT KNEE ARTHROSCOPY WITH PARTIAL MEDIAL MENISCECTOMY;  Surgeon: Leandrew Koyanagi, MD;  Location: [place];  Service: Orthopedics;  Laterality: Left;   LEFT  HEART CATH AND CORONARY ANGIOGRAPHY N/A 02/23/2019   Procedure: LEFT HEART CATH AND CORONARY ANGIOGRAPHY;  Surgeon: Martinique, Peter M, MD;  Location: [place] CV LAB;  Service: Cardiovascular;  Laterality: N/A;   LUMBAR SPINE SURGERY     x 2   NASAL SINUS SURGERY     REPAIR OF ACUTE ASCENDING THORACIC AORTIC DISSECTION N/A 02/23/2019   Procedure: REPAIR OF TYPE A  - ACUTE ASCENDING THORACIC AORTIC DISSECTION, using Hemashield Platinum Woven Double Velour Vascular Graft (D: 51m, L: 30cm);  Surgeon: VPrescott Gum PCollier Salina MD;  [facility];  Service: Vascular;  Laterality: N/A;   TEE WITHOUT CARDIOVERSION  05/28/2012   Procedure: TRANSESOPHAGEAL ECHOCARDIOGRAM (TEE);  Surgeon: BLelon Perla MD;  [facility];  Service: Cardiovascular;  Laterality: N/A;   TEE WITHOUT CARDIOVERSION N/A 02/23/2019   Procedure: TRANSESOPHAGEAL ECHOCARDIOGRAM (TEE);  Surgeon: VPrescott Gum PCollier Salina MD;  [place]  Service: Open Heart Surgery;  Laterality: N/A;     reports that he quit smoking about 2 months ago. His smoking use included cigarettes. He has a 4.00 pack-year smoking history. He has never used smokeless tobacco. He reports that he does not drink alcohol or use drugs.  Allergies  Allergen Reactions   Penicillins Hives and Rash    Did it involve swelling of the face/tongue/throat, SOB, or low BP? Yes Did it involve sudden or severe rash/hives, skin peeling, or any reaction on the inside of your mouth or nose? Yes  Did you need to seek medical attention at a hospital or doctor's office? Yes When did it last happen?As a child. If all above answers are NO, may proceed with cephalosporin use.     Family History  Problem Relation Age of Onset   Liver disease Mother    Liver cancer Mother    Breast cancer Mother    Cancer Mother    Hypertension Mother    Heart attack Mother    Cancer Father    Heart disease Father        before age 59  Hyperlipidemia Father     Hypertension Father    Heart attack Father    Heart disease Other        multiple family members on maternal and paternal side of family   Diabetes Sister    Hyperlipidemia Sister    Hypertension Sister    Diabetes Paternal Grandmother    Hypertension Brother    Colon cancer Neg Hx      Prior to Admission medications   Medication Sig Start Date End Date Taking? Authorizing Provider  acetaminophen (TYLENOL) 325 MG tablet Take 2 tablets (650 mg total) by mouth every 4 (four) hours as needed for headache or mild pain. 03/05/19  Yes Roddenberry, Myron G, PA-C  albuterol (PROVENTIL HFA;VENTOLIN HFA) 108 (90 Base) MCG/ACT inhaler Inhale 1 puff into the lungs every 6 (six) hours as needed for  wheezing or shortness of breath.    Yes [provider]  allopurinol (ZYLOPRIM) 100 MG tablet Take 100 mg by mouth daily.    Yes [provider]  amiodarone (PACERONE) 200 MG tablet Take 2 tablets (400 mg total) by mouth every 12 (twelve) hours. Take 2 tablets by mouth twice daily for 7 days then 1 tablet by mouth twice daily thereafter. Patient taking differently: Take 200 mg by mouth 2 (two) times daily.  03/05/19  Yes Roddenberry, Arlis Porta, PA-C  amLODipine (NORVASC) 10 MG tablet Take 1 tablet (10 mg total) by mouth daily. 03/06/19  Yes Antony Odea, PA-C  aspirin 325 MG tablet Take 325 mg by mouth daily.    Yes [provider]  atorvastatin (LIPITOR) 80 MG tablet Take 1 tablet (80 mg total) by mouth daily at 6 PM. 03/05/19  Yes Roddenberry, Myron G, PA-C  carvedilol (COREG) 25 MG tablet Take 1 tablet (25 mg total) by mouth 2 (two) times daily with a meal. 03/05/19  Yes Roddenberry, Myron G, PA-C  esomeprazole (NEXIUM) 40 MG capsule Take 40 mg by mouth 2 (two) times daily.   Yes [provider]  ipratropium-albuterol (DUONEB) 0.5-2.5 (3) MG/3ML SOLN Take 3 mLs by nebulization every 8 (eight) hours as needed (wheezing, shortness of breath). 11/29/18  Yes Johnson,  Clanford L, MD  losartan (COZAAR) 100 MG tablet Take 1 tablet (100 mg total) by mouth daily. 03/06/19  Yes Roddenberry, Arlis Porta, PA-C  Multiple Vitamin (MULITIVITAMIN WITH MINERALS) TABS Take 1 tablet by mouth daily.   Yes [provider]  nitroGLYCERIN (NITROSTAT) 0.4 MG SL tablet Place 0.4 mg under the tongue every 5 (five) minutes as needed. For chest pain   Yes [provider]  Omega-3 Fatty Acids (FISH OIL) 1200 MG CPDR Take 1,200 mg by mouth daily.   Yes [provider]  traZODone (DESYREL) 100 MG tablet Take 100 mg by mouth at bedtime.    Yes [provider]    Physical Exam: Vitals:   03/18/19 0306 03/18/19 0310 03/18/19 0312  BP: 137/90    Pulse: 68  (!) 101  Resp:   20  Temp: (!) 101.3 F (38.5 C)    TempSrc: Oral    SpO2: 97% 96% 100%    Constitutional: NAD, calm, comfortable Eyes: PERRL, lids and conjunctivae normal ENMT: Mucous membranes are moist. Posterior pharynx clear of any exudate or lesions.Normal dentition.  Neck: normal, supple, no masses, no thyromegaly Respiratory: clear to auscultation bilaterally, no wheezing, no crackles. Normal respiratory effort. No accessory muscle use.  Cardiovascular: Regular rate and rhythm, no murmurs / rubs / gallops. No extremity edema. 2+ pedal pulses. No carotid bruits.  Abdomen: no tenderness, no masses palpated. No hepatosplenomegaly. Bowel sounds positive.  Musculoskeletal: no clubbing / cyanosis. No joint deformity upper and lower extremities. Good ROM, no contractures. Normal muscle tone.  Skin: Sternotomy incision appears mostly healed, doesn't appear to have any evidence of dehiscence on my exam. Neurologic: CN 2-12 grossly intact. Sensation intact, DTR normal. Strength 5/5 in all 4.  Psychiatric: Normal judgment and insight. Alert and oriented x 3. Normal mood.    Labs on Admission: I have personally reviewed following labs and imaging studies  CBC: Recent Labs  Lab 03/18/19 0315  03/18/19 0329  WBC 11.9*  --   NEUTROABS 8.8*  --   HGB 8.1* 7.8*  HCT 27.0* 23.0*  MCV 91.2  --   PLT 455*  --    Basic  Metabolic Panel: Recent Labs  Lab 03/18/19 0315 03/18/19 0329  NA 137 136  K 4.2 4.0  CL 104  --   CO2 27  --   GLUCOSE 123*  --   BUN 13  --   CREATININE 1.02  --   CALCIUM 9.2  --    GFR: Estimated Creatinine Clearance: 92.6 mL/min (by C-G formula based on SCr of 1.02 mg/dL). Liver Function Tests: Recent Labs  Lab 03/18/19 0315  AST 19  ALT 29  ALKPHOS 106  BILITOT 0.5  PROT 6.8  ALBUMIN 2.6*   No results for input(s): LIPASE, AMYLASE in the last 168 hours. No results for input(s): AMMONIA in the last 168 hours. Coagulation Profile: No results for input(s): INR, PROTIME in the last 168 hours. Cardiac Enzymes: No results for input(s): CKTOTAL, CKMB, CKMBINDEX, TROPONINI in the last 168 hours. BNP (last 3 results) No results for input(s): PROBNP in the last 8760 hours. HbA1C: No results for input(s): HGBA1C in the last 72 hours. CBG: No results for input(s): GLUCAP in the last 168 hours. Lipid Profile: No results for input(s): CHOL, HDL, LDLCALC, TRIG, CHOLHDL, LDLDIRECT in the last 72 hours. Thyroid Function Tests: No results for input(s): TSH, T4TOTAL, FREET4, T3FREE, THYROIDAB in the last 72 hours. Anemia Panel: No results for input(s): VITAMINB12, FOLATE, FERRITIN, TIBC, IRON, RETICCTPCT in the last 72 hours. Urine analysis:    Component Value Date/Time   COLORURINE YELLOW 03/18/2019 [place] 03/18/2019 0455   LABSPEC 1.013 03/18/2019 0455   PHURINE 5.0 03/18/2019 0455   GLUCOSEU NEGATIVE 03/18/2019 0455   HGBUR NEGATIVE 03/18/2019 0455   BILIRUBINUR NEGATIVE 03/18/2019 0455   KETONESUR NEGATIVE 03/18/2019 0455   PROTEINUR NEGATIVE 03/18/2019 0455   UROBILINOGEN 0.2 08/03/2015 0608   NITRITE NEGATIVE 03/18/2019 0455   LEUKOCYTESUR NEGATIVE 03/18/2019 0455    Radiological Exams on Admission: Dg Chest  Portable 1 View  Result Date: 03/18/2019 CLINICAL DATA:  Pain EXAM: PORTABLE CHEST 1 VIEW COMPARISON:  03/03/2019 FINDINGS: Prior CABG. Cardiomegaly. No confluent opacities, effusions or edema. No acute bony abnormality. IMPRESSION: Cardiomegaly.  No active disease. Electronically Signed   By: Rolm Baptise M.D.   On: 03/18/2019 03:36   Ct Angio Chest Aorta W/cm &/or Wo/cm  Result Date: 03/18/2019 CLINICAL DATA:  Stiffness in the back beginning a few hours ago. Recent ascending aortic dissection repair EXAM: CT ANGIOGRAPHY CHEST WITH CONTRAST TECHNIQUE: Multidetector CT imaging of the chest was performed using the standard protocol during bolus administration of intravenous contrast. Multiplanar CT image reconstructions and MIPs were obtained to evaluate the vascular anatomy. CONTRAST:  27m OMNIPAQUE IOHEXOL 350 MG/ML SOLN COMPARISON:  Chest CTA 02/23/2019. There is some kind of archive error, only coronal reformats are available from that study. FINDINGS: Cardiovascular: Recent ascending aortic repair with patent graft. No cardiomegaly. There is a moderate pericardial effusion that is lobulated in places, up to 14 mm in thickness. Substernal low-density with perceptible enhancing wall you have been in the arterial phase. On axial slices the collection measures up to 4 cm in maximal diameter. The mediastinal fat and presternal fat is stranded. Wound is not dehiscent and there is no clear involucrum or other osteomyelitis changes, although recent surgery. There does appear to be low-density rim enhancing collection rightward from the graft measuring up to 4 cm. Mural irregularity along the anterior and right aspect of the proximal graft is favored to reflect postoperative change. Single-vessel CABG to the right coronary circulation that is patent  proximally. Pulmonary arteries are patent. Dissection along the arch and descending aorta with termination before the aortic hiatus. There is mild opacification of the  false lumen that is progressed. When compared on coronal images the dissection has mildly propagated in the descending segment. Mediastinum/Nodes: Adenopathy considered reactive. Lungs/Pleura: There is no edema, consolidation, effusion, or pneumothorax. Upper Abdomen: No acute finding Musculoskeletal: Sternal findings described above. These results were were discussed via telephone at the time of interpretation on 03/18/2019 at 5:56 am with Dr. Jennette Kettle. Review of the MIP images confirms the above findings. IMPRESSION: 1. Rim enhancing fluid collections below the sternotomy and rightward of the ascending aortic graft, history suggesting abscess. Moderate pericardial effusion. 2. Patent ascending aortic graft and right coronary graft. Mural irregularity along the proximal aortic graft is likely postoperative. If location is discordant with operative findings a follow-up gated study may be used to exclude an early paravalvular leak. 3. Known descending aortic dissection with increased enhancement within the false lumen. There has been mild propagation of the dissection towards the aortic hiatus. Electronically Signed   By: Monte Fantasia M.D.   On: 03/18/2019 06:01    EKG: Independently reviewed.  Assessment/Plan Principal Problem:   Abscess of sternal region Active Problems:   Essential hypertension   CAD (coronary artery disease)   History of CVA (cerebrovascular accident)   NASH (nonalcoholic steatohepatitis)   [name] (chronic obstructive pulmonary disease) (HCC)   Aortic dissection (HCC)   S/P ascending aortic aneurysm repair   PAF (paroxysmal atrial fibrillation) ([place])    1. Abscess of sternal region - deep sternal wound infection / possibly aortic graft infection. 1. BCx obtained 2. Continue Aztreonam, flagyl, vanc 3. Dr. Prescott Gum coming to see patient this AM, will try and give him another page around 7am to update him with the CTA findings. 4. Will keep patient NPO and hold ASA until  Dr. Prescott Gum can evaluate. 5. IVF: NS at 100 cc/hr 6. Tylenol PRN fever 2. CAD - 1. S/P 1 vessel CABG 2. Trop neg despite 3 days of CP 3. CP, fever, believed to be due to CTA findings as above as opposed to ACS 4. Will hold ASA as noted above 3. PAF - 1. Continue Amiodarone 2. Continue coreg 3. Looks like they dont have him on anticoagulants at this time despite history of CVA, but due to acute issues above, wont start at the moment obviously. 4. NASH - chronic, stable 5. HTN - 1. Holding Amlodipine and Losartan for the moment. 2. Continue Coreg  DVT prophylaxis: SCDs Code Status: Full Family Communication: No family in room Disposition Plan: TBD Consults called: Dr. Cyndia Bent called by EDP, will give Dr. Prescott Gum a call now with the update of CTA results Admission status: Admit to inpatient  Severity of Illness: The appropriate patient status for this patient is INPATIENT. Inpatient status is judged to be reasonable and necessary in order to provide the required intensity of service to ensure the patient's safety. The patient's presenting symptoms, physical exam findings, and initial radiographic and laboratory data in the context of their chronic comorbidities is felt to place them at high risk for further clinical deterioration. Furthermore, it is not anticipated that the patient will be medically stable for discharge from the hospital within 2 midnights of admission. The following factors support the patient status of inpatient.   Inpatient status for management of sternal wound infection, sternal abscess, and possibly infected aortic dissection graft.   * I certify that  at the point of admission it is my clinical judgment that the patient will require inpatient hospital care spanning beyond 2 midnights from the point of admission due to high intensity of service, high risk for further deterioration and high frequency of surveillance required.*    Honestie Kulik M. DO [facility]  How to contact the [facility] Attending or Consulting provider [place] or covering provider during after hours [place], for this patient?  1. Check the care team in [facility] and look for a) attending/consulting [facility] provider listed and b) the [facility] team listed 2. Log into [email or web address]  Amion Physician Scheduling and messaging for groups and whole hospitals  On call and physician scheduling software for group practices, residents, hospitalists and other medical providers for call, clinic, rotation and shift schedules. OnCall [employer] is a hospital-wide system for scheduling doctors and paging doctors on call. EasyPlot is for scientific plotting and data analysis.  [email or web address]  and use [place]'s universal password to access. If you do not have the password, please contact the hospital operator.  3. Locate the [facility] provider you are looking for under [facility] and page to a number that you can be directly reached. 4. If you still have difficulty reaching the provider, please page the [facility] (Director on Call) for the Hospitalists listed on amion for assistance.  03/18/2019, 6:22 AM

## 2019-03-18 NOTE — Progress Notes (Signed)
RT NOTE:  Pt switched from BIPAP to 2L [place] for CT transport. Will assess patient upon return. BIPAP PRN

## 2019-03-18 NOTE — Progress Notes (Signed)
Pharmacy Antibiotic Note  [name] [name] is a 58 y.o. male admitted on 03/18/2019 with sepsis.  Pharmacy has been consulted for Vancomycin and Aztreonam dosing.  Plan: Aztreonam 2gm IV q8h Vancomycin 2 gm loading dose given then 1750j mg IV Q 24 hrs. Goal AUC 400-550. Expected AUC: 495 SCr used: 1.02 Will f/u renal function, micro data, and pt's clinical condition Vanc levels prn      Temp (24hrs), Avg:101.3 F (38.5 C), Min:101.3 F (38.5 C), Max:101.3 F (38.5 C)  Recent Labs  Lab 03/18/19 0315 03/18/19 0332  WBC 11.9*  --   CREATININE 1.02  --   LATICACIDVEN  --  2.9*    Estimated Creatinine Clearance: 92.6 mL/min (by C-G formula based on SCr of 1.02 mg/dL).    Allergies  Allergen Reactions  . Penicillins Hives and Rash    Did it involve swelling of the face/tongue/throat, SOB, or low BP? Yes Did it involve sudden or severe rash/hives, skin peeling, or any reaction on the inside of your mouth or nose? Yes  Did you need to seek medical attention at a hospital or doctor's office? Yes When did it last happen?As a child. If all above answers are "NO", may proceed with cephalosporin use.     Antimicrobials this admission: 3/20 Vanc >>  3/20 Aztreonam >>  3/20 Flagyl >>  Microbiology results: 3/20 BCx:  3/20 UCx:   RSV panel:  Thank you for allowing pharmacy to be a part of this patient's care.  Sherlon Handing, PharmD, BCPS Clinical pharmacist  **Pharmacist phone directory can now be found on [email or web address] ([email or web address]).  Listed under [place]. 03/18/2019 5:14 AM

## 2019-03-18 NOTE — Progress Notes (Signed)
  Echocardiogram 2D Echocardiogram has been performed.  [name] [name] 03/18/2019, 9:26 AM

## 2019-03-19 LAB — CBC
HCT: 25 % — ABNORMAL LOW (ref 39.0–52.0)
Hemoglobin: 7.8 g/dL — ABNORMAL LOW (ref 13.0–17.0)
MCH: 27.8 pg (ref 26.0–34.0)
MCHC: 31.2 g/dL (ref 30.0–36.0)
MCV: 89 fL (ref 80.0–100.0)
Platelets: 366 10*3/uL (ref 150–400)
RBC: 2.81 MIL/uL — ABNORMAL LOW (ref 4.22–5.81)
RDW: 14.2 % (ref 11.5–15.5)
WBC: 10 10*3/uL (ref 4.0–10.5)
nRBC: 0 % (ref 0.0–0.2)

## 2019-03-19 LAB — BASIC METABOLIC PANEL
Anion gap: 12 (ref 5–15)
BUN: 10 mg/dL (ref 6–20)
CO2: 21 mmol/L — ABNORMAL LOW (ref 22–32)
Calcium: 9.2 mg/dL (ref 8.9–10.3)
Chloride: 104 mmol/L (ref 98–111)
Creatinine, Ser: 0.66 mg/dL (ref 0.61–1.24)
GFR calc non Af Amer: >60 mL/min (ref >60)
Glucose, Bld: 110 mg/dL — ABNORMAL HIGH (ref 70–99)
Potassium: 3.9 mmol/L (ref 3.5–5.1)
SODIUM: 137 mmol/L (ref 135–145)

## 2019-03-19 LAB — URINE CULTURE
Culture: NO GROWTH
Special Requests: NORMAL

## 2019-03-19 MED ORDER — FE FUMARATE-B12-VIT C-FA-IFC PO CAPS
1.0000 | ORAL_CAPSULE | Freq: Three times a day (TID) | ORAL | Status: DC
  Administered 2019-03-19 – 2019-04-07 (×54): 1 via ORAL
  Filled 2019-03-19 (×60): qty 1

## 2019-03-19 MED ORDER — COLCHICINE 0.6 MG PO TABS
0.6000 mg | ORAL_TABLET | Freq: Every day | ORAL | Status: DC
  Administered 2019-03-19 – 2019-04-07 (×17): 0.6 mg via ORAL
  Filled 2019-03-19 (×18): qty 1

## 2019-03-19 MED ORDER — ENOXAPARIN SODIUM 40 MG/0.4ML ~~LOC~~ SOLN
40.0000 mg | SUBCUTANEOUS | Status: DC
  Administered 2019-03-19 – 2019-03-21 (×3): 40 mg via SUBCUTANEOUS
  Filled 2019-03-19 (×3): qty 0.4

## 2019-03-19 MED ORDER — FUROSEMIDE 40 MG PO TABS
40.0000 mg | ORAL_TABLET | Freq: Every day | ORAL | Status: DC
  Administered 2019-03-19 – 2019-03-24 (×5): 40 mg via ORAL
  Filled 2019-03-19 (×6): qty 1

## 2019-03-19 MED ORDER — OXYCODONE-ACETAMINOPHEN 5-325 MG PO TABS
1.0000 | ORAL_TABLET | ORAL | Status: DC | PRN
  Administered 2019-03-19 – 2019-03-23 (×15): 2 via ORAL
  Administered 2019-03-23: 1 via ORAL
  Administered 2019-03-23 – 2019-04-04 (×40): 2 via ORAL
  Administered 2019-04-05: 08:00:00 1 via ORAL
  Administered 2019-04-05 – 2019-04-07 (×7): 2 via ORAL
  Filled 2019-03-19 (×66): qty 2

## 2019-03-19 NOTE — Progress Notes (Signed)
PROGRESS NOTE  [name] [name] OEV:[id number] DOB: [birth year] DOA: 03/18/2019 PCP: Shirline Frees, MD  HPI/Recap of past 24 hours: [name] Char Pattersonis a 58 y.o.malewith medical history significant ofCOPD, PAF on amiodarone, not on oral anticoagulation, CAD, admitted for unstable angina at end of Feb, underwent LHC with PCI to 90% RCA lesion, unfortunately this ended up being complicated by type A aortic dissection.  Patient underwent repair of Type A dissection with single vessel RCA CABG (with SVG) and was discharged home. He was scheduled to see Dr. Prescott Gum in officefollow up 03/18/19.  For the past 3 days he has had progressively worsening central CP and SOB. EMS called.  Admitted for sepsis secondary to possible sternal abscess.  Cardiothoracic surgery consulted and following.  03/19/19: Patient seen and examined at his bedside.  No acute events overnight.  Reports some chest discomfort from prior surgery.  States shortness of breath is improving.  He has no new complaints.  Assessment/Plan: Principal Problem:   Abscess of sternal region Active Problems:   Essential hypertension   CAD (coronary artery disease)   History of CVA (cerebrovascular accident)   NASH (nonalcoholic steatohepatitis)   COPD (chronic obstructive pulmonary disease) (HCC)   Aortic dissection (HCC)   S/P ascending aortic aneurysm repair   PAF (paroxysmal atrial fibrillation) (HCC)  Sepsis secondary to questionable sternal abscess Presented with fever, tachycardia, tachypnea CTA chest aorta with and without contrast showed rim-enhancing fluid collections below the sternotomy and rightward of the ascending aortic graft, history suggesting abscess per radiology report. Continue IV Flagyl, IV vancomycin and IV Aztreonam (penicillin allergy) Monitor fever curve and WBC Afebrile with no leukocytosis Vital signs and labs reviewed and are stable CTS following  Coronary artery disease status post  CABG Status post one-vessel CABG Continue Lipitor 80 mg daily  Paroxysmal A. fib Continue amiodarone for rhythm control Continue Coreg for rate control Not on oral anticoagulation  GERD Stable Resume Protonix 80 mg twice daily  Iron deficiency anemia Continue iron supplement  Uncontrolled hypertension Continue current medications Stop IV fluid normal saline IV hydralazine PRN  NASH -Chronic, currently stable    DVT Prophylaxis  subcu Lovenox daily  Code Status: Full  Family Communication: Wife at bedside  Disposition Plan: Admitted. Pending further recommendations from CTS. Likely home when stable.  Consultants Cardiothoracic surgery, Dr. Lucianne Lei Trigt  Procedures  None     Objective: Vitals:   03/18/19 2300 03/19/19 0322 03/19/19 0332 03/19/19 0732  BP: 140/68 135/71  (!) 155/80  Pulse: 86 83 87 87  Resp: (!) 28 (!) 23 (!) 24 (!) 23  Temp: 99.8 F (37.7 C) 98.5 F (36.9 C)  99 F (37.2 C)  TempSrc: Oral Oral    SpO2: 98% 100% 98% 100%  Weight:      Height:        Intake/Output Summary (Last 24 hours) at 03/19/2019 1028 Last data filed at 03/19/2019 1000 Gross per 24 hour  Intake 2550 ml  Output 3250 ml  Net -700 ml   Filed Weights   03/18/19 0800  Weight: 107 kg    Exam:  . General: 57 y.o. year-old male well developed well nourished in no acute distress.  Alert and oriented x3. . Cardiovascular: Regular rate and rhythm with no rubs or gallops.  No thyromegaly or JVD noted.  Scar noted on mid chest. . Respiratory: Clear to auscultation with no wheezes or rales. Good inspiratory effort. . Abdomen: Soft nontender nondistended with normal bowel sounds x4  quadrants. . Musculoskeletal: 1+ pitting edema lower extremity bilaterally. 2/4 pulses in all 4 extremities. Marland Kitchen Psychiatry: Mood is appropriate for condition and setting   Data Reviewed: CBC: Recent Labs  Lab 03/18/19 0315 03/18/19 0329 03/19/19 0113  WBC 11.9*  --  10.0  NEUTROABS  8.8*  --   --   HGB 8.1* 7.8* 7.8*  HCT 27.0* 23.0* 25.0*  MCV 91.2  --  89.0  PLT 455*  --  938   Basic Metabolic Panel: Recent Labs  Lab 03/18/19 0315 03/18/19 0329 03/19/19 0113  NA 137 136 137  K 4.2 4.0 3.9  CL 104  --  104  CO2 27  --  21*  GLUCOSE 123*  --  110*  BUN 13  --  10  CREATININE 1.02  --  0.66  CALCIUM 9.2  --  9.2   GFR: Estimated Creatinine Clearance: 119.3 mL/min (by C-G formula based on SCr of 0.66 mg/dL). Liver Function Tests: Recent Labs  Lab 03/18/19 0315  AST 19  ALT 29  ALKPHOS 106  BILITOT 0.5  PROT 6.8  ALBUMIN 2.6*   No results for input(s): LIPASE, AMYLASE in the last 168 hours. No results for input(s): AMMONIA in the last 168 hours. Coagulation Profile: Recent Labs  Lab 03/18/19 0853  INR 1.1   Cardiac Enzymes: No results for input(s): CKTOTAL, CKMB, CKMBINDEX, TROPONINI in the last 168 hours. BNP (last 3 results) No results for input(s): PROBNP in the last 8760 hours. HbA1C: No results for input(s): HGBA1C in the last 72 hours. CBG: No results for input(s): GLUCAP in the last 168 hours. Lipid Profile: No results for input(s): CHOL, HDL, LDLCALC, TRIG, CHOLHDL, LDLDIRECT in the last 72 hours. Thyroid Function Tests: No results for input(s): TSH, T4TOTAL, FREET4, T3FREE, THYROIDAB in the last 72 hours. Anemia Panel: No results for input(s): VITAMINB12, FOLATE, FERRITIN, TIBC, IRON, RETICCTPCT in the last 72 hours. Urine analysis:    Component Value Date/Time   COLORURINE YELLOW 03/18/2019 [place] 03/18/2019 0455   LABSPEC 1.013 03/18/2019 0455   PHURINE 5.0 03/18/2019 0455   GLUCOSEU NEGATIVE 03/18/2019 0455   HGBUR NEGATIVE 03/18/2019 0455   BILIRUBINUR NEGATIVE 03/18/2019 0455   KETONESUR NEGATIVE 03/18/2019 0455   PROTEINUR NEGATIVE 03/18/2019 0455   UROBILINOGEN 0.2 08/03/2015 0608   NITRITE NEGATIVE 03/18/2019 0455   LEUKOCYTESUR NEGATIVE 03/18/2019 0455   Sepsis Labs:  @[email or web address] (procalcitonin:4,lacticidven:4)  ) Recent Results (from the past 240 hour(s))  Blood Culture (routine x 2)     Status: None (Preliminary result)   Collection Time: 03/18/19  3:00 AM  Result Value Ref Range Status   Specimen Description BLOOD LEFT ARM  Final   Special Requests   Final    BOTTLES DRAWN AEROBIC AND ANAEROBIC Blood Culture adequate volume   Culture   Final    NO GROWTH 1 DAY Performed at [facility], [place] [address], [place], [place] [address]    Report Status PENDING  Incomplete  Blood Culture (routine x 2)     Status: None (Preliminary result)   Collection Time: 03/18/19  3:30 AM  Result Value Ref Range Status   Specimen Description BLOOD RIGHT HAND  Final   Special Requests   Final    BOTTLES DRAWN AEROBIC ONLY Blood Culture adequate volume   Culture   Final    NO GROWTH 1 DAY Performed at [facility], [place] [address], [place],  [address]    Report Status  PENDING  Incomplete  Urine culture     Status: None   Collection Time: 03/18/19  4:51 AM  Result Value Ref Range Status   Specimen Description URINE, CLEAN CATCH  Final   Special Requests Normal  Final   Culture   Final    NO GROWTH Performed at [facility], [address] [place], [place] [address]    Report Status 03/19/2019 FINAL  Final  Respiratory Panel by PCR     Status: None   Collection Time: 03/18/19  5:54 AM  Result Value Ref Range Status   Adenovirus NOT DETECTED NOT DETECTED Final   Coronavirus 229E NOT DETECTED NOT DETECTED Final    Comment: (NOTE) The Coronavirus on the Respiratory Panel, DOES NOT test for the novel  Coronavirus (2019 nCoV)    Coronavirus HKU1 NOT DETECTED NOT DETECTED Final   Coronavirus NL63 NOT DETECTED NOT DETECTED Final   Coronavirus OC43 NOT DETECTED NOT DETECTED Final   Metapneumovirus NOT DETECTED NOT DETECTED Final   Rhinovirus / Enterovirus NOT DETECTED NOT DETECTED Final   Influenza A NOT DETECTED NOT DETECTED Final    Influenza B NOT DETECTED NOT DETECTED Final   Parainfluenza Virus 1 NOT DETECTED NOT DETECTED Final   Parainfluenza Virus 2 NOT DETECTED NOT DETECTED Final   Parainfluenza Virus 3 NOT DETECTED NOT DETECTED Final   Parainfluenza Virus 4 NOT DETECTED NOT DETECTED Final   Respiratory Syncytial Virus NOT DETECTED NOT DETECTED Final   Bordetella pertussis NOT DETECTED NOT DETECTED Final   Chlamydophila pneumoniae NOT DETECTED NOT DETECTED Final   Mycoplasma pneumoniae NOT DETECTED NOT DETECTED Final    Comment: Performed at [facility], [place] [address], [place], [place] [address]  MRSA PCR Screening     Status: None   Collection Time: 03/18/19  9:23 AM  Result Value Ref Range Status   MRSA by PCR NEGATIVE NEGATIVE Final    Comment:        The GeneXpert MRSA Assay (FDA approved for NASAL specimens only), is one component of a comprehensive MRSA colonization surveillance program. It is not intended to diagnose MRSA infection nor to guide or monitor treatment for MRSA infections. Performed at [facility], [place] [address] [place], [place] [address]       Studies: No results found.  Scheduled Meds: . allopurinol  100 mg Oral Daily  . amiodarone  200 mg Oral BID  . atorvastatin  80 mg Oral q1800  . carvedilol  25 mg Oral BID WC  . ferrous [id number] C-folic acid  1 capsule Oral TID PC  . pantoprazole  80 mg Oral BID  . traZODone  100 mg Oral QHS    Continuous Infusions: . aztreonam 2 g (03/19/19 0700)  . lactated ringers    . metronidazole 500 mg (03/19/19 0326)  . vancomycin 1,750 mg (03/19/19 0453)     LOS: 1 day     Kayleen Memos, MD [facility] Pager [phone]  If 7PM-7AM, please contact night-coverage [email or web address] Password [facility] 03/19/2019, 10:28 AM

## 2019-03-19 NOTE — Progress Notes (Signed)
  Subjective: Fever resolved, chest pain improved, white count improving and cultures remain negative Sinus rhythm Surgical incisions clean and dry Mild edema-we will resume p.o. Lasix 40 mg daily Probable postoperative pericarditis-small pericardial effusion.  Will start colchicine and continue to avoid anticoagulation. We will plan on keeping patient in hospital to Monday and repeat echocardiogram before discharge.  Objective: Vital signs in last 24 hours: Temp:  [98.3 F (36.8 C)-99.8 F (37.7 C)] 98.6 F (37 C) (03/21 1119) Pulse Rate:  [81-91] 81 (03/21 1119) Cardiac Rhythm: Normal sinus rhythm (03/21 0800) Resp:  [19-28] 21 (03/21 1119) BP: (135-155)/(68-83) 140/74 (03/21 1119) SpO2:  [97 %-100 %] 100 % (03/21 1140) FiO2 (%):  [40 %] 40 % (03/21 0732)  Hemodynamic parameters for last 24 hours:  Sinus rhythm stable blood pressure  Intake/Output from previous day: 03/20 0701 - 03/21 0700 In: 2010 [P.O.:700; I.V.:1310] Out: 2950 [Urine:2950] Intake/Output this shift: Total I/O In: 900 [P.O.:600; I.V.:300] Out: 550 [Urine:550]  Lungs clear  No murmur Incisions clean and dry  Lab Results: Recent Labs    03/18/19 0315 03/18/19 0329 03/19/19 0113  WBC 11.9*  --  10.0  HGB 8.1* 7.8* 7.8*  HCT 27.0* 23.0* 25.0*  PLT 455*  --  366   BMET:  Recent Labs    03/18/19 0315 03/18/19 0329 03/19/19 0113  NA 137 136 137  K 4.2 4.0 3.9  CL 104  --  104  CO2 27  --  21*  GLUCOSE 123*  --  110*  BUN 13  --  10  CREATININE 1.02  --  0.66  CALCIUM 9.2  --  9.2    PT/INR:  Recent Labs    03/18/19 0853  LABPROT 14.5  INR 1.1   ABG    Component Value Date/Time   PHART 7.451 (H) 03/18/2019 0329   HCO3 24.1 03/18/2019 0329   TCO2 25 03/18/2019 0329   ACIDBASEDEF 3.0 (H) 02/24/2019 0155   O2SAT 91.0 03/18/2019 0329   CBG (last 3)  No results for input(s): GLUCAP in the last 72 hours.  Assessment/Plan: S/P  Resume low-dose daily Lasix Start oral  colchicine 0.6 mg daily for postoperative pericarditis-effusion Continue IV antibiotics until final cultures are reported Repeat echocardiogram Monday with possible discharge  the next day   LOS: 1 day    [name] [name] 03/19/2019

## 2019-03-20 LAB — CBC
HCT: 25.4 % — ABNORMAL LOW (ref 39.0–52.0)
Hemoglobin: 7.6 g/dL — ABNORMAL LOW (ref 13.0–17.0)
MCH: 26.7 pg (ref 26.0–34.0)
MCHC: 29.9 g/dL — ABNORMAL LOW (ref 30.0–36.0)
MCV: 89.1 fL (ref 80.0–100.0)
Platelets: 388 10*3/uL (ref 150–400)
RBC: 2.85 MIL/uL — ABNORMAL LOW (ref 4.22–5.81)
RDW: 14.1 % (ref 11.5–15.5)
WBC: 9.1 10*3/uL (ref 4.0–10.5)
nRBC: 0 % (ref 0.0–0.2)

## 2019-03-20 LAB — BASIC METABOLIC PANEL
Anion gap: 7 (ref 5–15)
BUN: 13 mg/dL (ref 6–20)
CO2: 25 mmol/L (ref 22–32)
Calcium: 8.9 mg/dL (ref 8.9–10.3)
Chloride: 102 mmol/L (ref 98–111)
Creatinine, Ser: 0.81 mg/dL (ref 0.61–1.24)
GFR calc Af Amer: >60 mL/min (ref >60)
GFR calc non Af Amer: >60 mL/min (ref >60)
Glucose, Bld: 111 mg/dL — ABNORMAL HIGH (ref 70–99)
Potassium: 3.6 mmol/L (ref 3.5–5.1)
Sodium: 134 mmol/L — ABNORMAL LOW (ref 135–145)

## 2019-03-20 MED ORDER — POTASSIUM CHLORIDE CRYS ER 20 MEQ PO TBCR
40.0000 meq | EXTENDED_RELEASE_TABLET | Freq: Every day | ORAL | Status: AC
  Administered 2019-03-20 – 2019-03-21 (×2): 40 meq via ORAL
  Filled 2019-03-20 (×4): qty 2

## 2019-03-20 NOTE — Progress Notes (Signed)
  Subjective: Patient states sternal incision is sore but denies sternal click Patient afebrile with stable white count 9.1 Some erythema around upper sternal incision without fluctuance Breath sounds clear Peripheral edema mild with daily 40 Lasix p.o.  Objective: Vital signs in last 24 hours: Temp:  [98.5 F (36.9 C)-100 F (37.8 C)] 100 F (37.8 C) (03/22 1137) Pulse Rate:  [78-93] 91 (03/22 1329) Cardiac Rhythm: Normal sinus rhythm (03/22 1015) Resp:  [17-25] 21 (03/22 1329) BP: (129-160)/(68-77) 146/73 (03/22 1137) SpO2:  [97 %-100 %] 99 % (03/22 1329) Weight:  [109.3 kg] 109.3 kg (03/22 0409)  Hemodynamic parameters for last 24 hours:  Sinus rhythm stable blood pressure  Intake/Output from previous day: 03/21 0701 - 03/22 0700 In: [place] [P.O.:1320; I.V.:300; IV Piggyback:100] Out: 3200 [Urine:3200] Intake/Output this shift: Total I/O In: 700 [P.O.:500; IV Piggyback:200] Out: 800 [Urine:800]  Sternal incision appears to be healing without instability No cardiac murmur or gallop Lungs clear Abdomen soft 1-2+ pedal edema  Lab Results: Recent Labs    03/19/19 0113 03/20/19 0225  WBC 10.0 9.1  HGB 7.8* 7.6*  HCT 25.0* 25.4*  PLT 366 388   BMET:  Recent Labs    03/19/19 0113 03/20/19 0225  NA 137 134*  K 3.9 3.6  CL 104 102  CO2 21* 25  GLUCOSE 110* 111*  BUN 10 13  CREATININE 0.66 0.81  CALCIUM 9.2 8.9    PT/INR:  Recent Labs    03/18/19 0853  LABPROT 14.5  INR 1.1   ABG    Component Value Date/Time   PHART 7.451 (H) 03/18/2019 0329   HCO3 24.1 03/18/2019 0329   TCO2 25 03/18/2019 0329   ACIDBASEDEF 3.0 (H) 02/24/2019 0155   O2SAT 91.0 03/18/2019 0329   CBG (last 3)  No results for input(s): GLUCAP in the last 72 hours.  Assessment/Plan: S/P Emergency repair of aortic dissection  and CABG x1 following attempted PCI of proximal RCA stenosis  Expected postop blood loss anemia on oral iron supplement Postoperative pericarditis, small  pericardial effusion with atypical chest pain Mild erythema of the sternal incision, continue IV antibiotics Repeat echocardiogram on Monday to assess postoperative pericardial effusion  LOS: 2 days    Tharon Aquas Trigt III 03/20/2019

## 2019-03-20 NOTE — Evaluation (Signed)
Physical Therapy Evaluation Patient Details Name: [name] [name] MRN: [id number] DOB: 1961/02/20 Today's Date: 03/20/2019   History of Present Illness  [name] [name] is a 58 y.o. male with medical history significant of COPD, PAF on amiodarone, not on oral anticoagulation, CAD, admitted for unstable angina at end of Feb, underwent LHC with PCI to 90% RCA lesion, unfortunately this ended up being complicated by type A aortic dissection. Went home, then presented with 3 day history of SOB.  Clinical Impression  Orders received for PT evaluation. Patient demonstrates deficits in functional mobility as indicated below. Will benefit from continued skilled PT to address deficits and maximize function. Will see as indicated and progress as tolerated.  Anticipate patient will be safe for d/c home without further therapy needs.     Follow Up Recommendations No PT follow up    Equipment Recommendations  None recommended by PT    Recommendations for Other Services       Precautions / Restrictions Precautions Precautions: Sternal Precaution Comments: good recall of precautions Restrictions Weight Bearing Restrictions: No Other Position/Activity Restrictions: sternal precautions      Mobility  Bed Mobility               General bed mobility comments: in recliner upon arrival  Transfers Overall transfer level: Needs assistance Equipment used: None Transfers: Sit to/from Stand Sit to Stand: Independent Stand pivot transfers: Independent          Ambulation/Gait Ambulation/Gait assistance: Supervision Gait Distance (Feet): 280 Feet Assistive device: Rolling walker (2 wheeled) Gait Pattern/deviations: Step-through pattern;Decreased stride length;Wide base of support Gait velocity: reduced Gait velocity interpretation: 1.31 - 2.62 ft/sec, indicative of limited community ambulator General Gait Details: Saturations stable on room air, DOE 2/4. Steady with  ambulation  Stairs            Wheelchair Mobility    Modified Rankin (Stroke Patients Only)       Balance Overall balance assessment: Needs assistance Sitting-balance support: Feet supported Sitting balance-Leahy Scale: Good     Standing balance support: Bilateral upper extremity supported;During functional activity Standing balance-Leahy Scale: Fair Standing balance comment: UE support and min guard for static standing                             Pertinent Vitals/Pain Pain Assessment: No/denies pain    Home Living Family/patient expects to be discharged to:: Private residence Living Arrangements: Spouse/significant other Available Help at Discharge: Family Type of Home: House Home Access: Stairs to enter   Technical brewer of Steps: 1 Home Layout: One level Home Equipment: Cane - single point;Shower seat      Prior Function Level of Independence: Independent               Hand Dominance   Dominant Hand: Right    Extremity/Trunk Assessment   Upper Extremity Assessment Upper Extremity Assessment: RUE deficits/detail RUE Deficits / Details: R hand movement appeasr improved today. Increased ab/adduction; min increase of gross MP flexion; unable to oppose digits with thum. Able to grasp red tubing RUE Sensation: decreased light touch RUE Coordination: decreased fine motor LUE Coordination: decreased gross motor    Lower Extremity Assessment Lower Extremity Assessment: Overall WFL for tasks assessed    Cervical / Trunk Assessment Cervical / Trunk Assessment: Normal  Communication   Communication: No difficulties  Cognition Arousal/Alertness: Awake/alert Behavior During Therapy: WFL for tasks assessed/performed Overall Cognitive Status: Within Functional Limits for  tasks assessed                                        General Comments      Exercises     Assessment/Plan    PT Assessment Patient needs  continued PT services  PT Problem List Decreased strength;Decreased activity tolerance;Decreased balance;Decreased mobility;Decreased knowledge of use of DME;Decreased knowledge of precautions       PT Treatment Interventions DME instruction;Gait training;Functional mobility training;Therapeutic activities;Therapeutic exercise;Balance training;Patient/family education    PT Goals (Current goals can be found in the Care Plan section)  Acute Rehab PT Goals Patient Stated Goal: to be able to get home and feel better PT Goal Formulation: With patient Time For Goal Achievement: 03/14/19 Potential to Achieve Goals: Good    Frequency Min 3X/week   Barriers to discharge        Co-evaluation               AM-PAC PT "6 Clicks" Mobility  Outcome Measure Help needed turning from your back to your side while in a flat bed without using bedrails?: A Little Help needed moving from lying on your back to sitting on the side of a flat bed without using bedrails?: A Little Help needed moving to and from a bed to a chair (including a wheelchair)?: A Little Help needed standing up from a chair using your arms (e.g., wheelchair or bedside chair)?: A Little Help needed to walk in hospital room?: A Little Help needed climbing 3-5 steps with a railing? : A Little 6 Click Score: 18    End of Session Equipment Utilized During Treatment: Gait belt Activity Tolerance: Patient tolerated treatment well;Treatment limited secondary to medical complications (Comment) Patient left: in chair;with call bell/phone within reach Nurse Communication: Mobility status PT Visit Diagnosis: Unsteadiness on feet (R26.81);Other abnormalities of gait and mobility (R26.89);Muscle weakness (generalized) (M62.81)    Time: 4782-9562 PT Time Calculation (min) (ACUTE ONLY): 18 min   Charges:   PT Evaluation $PT Eval Moderate Complexity: 1 Mod          Alben Deeds, PT DPT  Board Certified Neurologic  Specialist Acute Rehabilitation Services Pager [phone] Office [phone]   Duncan Dull 03/20/2019, 11:26 AM

## 2019-03-20 NOTE — Progress Notes (Signed)
PROGRESS NOTE  [name] ANFINSON DGL:[id number] DOB: 08-Oct-1961 DOA: 03/18/2019 PCP: Shirline Frees, MD  HPI/Recap of past 24 hours: [name] Char Pattersonis a 58 y.o.malewith medical history significant ofCOPD, PAF on amiodarone, not on oral anticoagulation, CAD, admitted for unstable angina at end of Feb, underwent LHC with PCI to 90% RCA lesion, unfortunately this ended up being complicated by type A aortic dissection.  Patient underwent repair of Type A dissection with single vessel RCA CABG (with SVG) and was discharged home. He was scheduled to see Dr. Prescott Gum in officefollow up 03/18/19.  For the past 3 days he has had progressively worsening central CP and SOB. EMS called.  Admitted for sepsis secondary to possible sternal abscess.  Cardiothoracic surgery consulted and following.  03/20/19: Patient seen and examined at his bedside.  No acute events overnight.  He has no new complaints.  States he has still some discomfort in his chest from his surgery.  Started on colchicine yesterday 03/19/2019 by CTS.  Plan is to repeat 2D echo on Monday with possible DC on Tuesday.  Assessment/Plan: Principal Problem:   Abscess of sternal region Active Problems:   Essential hypertension   CAD (coronary artery disease)   History of CVA (cerebrovascular accident)   NASH (nonalcoholic steatohepatitis)   COPD (chronic obstructive pulmonary disease) (HCC)   Aortic dissection (HCC)   S/P ascending aortic aneurysm repair   PAF (paroxysmal atrial fibrillation) (HCC)  Resolving sepsis secondary to postoperative pericarditis Presented with fever, tachycardia, tachypnea CTA chest aorta with and without contrast showed rim-enhancing fluid collections below the sternotomy and rightward of the ascending aortic graft, history suggesting abscess per radiology report. Sepsis physiology is resolving Continue IV Flagyl, IV vancomycin and IV Aztreonam (penicillin allergy) Continue to monitor fever  curve and WBC Started on colchicine on 03/19/2019 by CTS Plan to repeat 2D echocardiogram on Monday with possible DC Tuesday Vital signs and labs reviewed and are stable Blood cultures x2- to date  Obtain CBC in the morning  Coronary artery disease status post CABG Status post one-vessel CABG Continue Lipitor 80 mg daily  Paroxysmal A. fib Continue amiodarone for rhythm control Continue Coreg for rate control Not on oral anticoagulation  Chronic diastolic CHF Continue strict I's and O's and daily weight Continue p.o. Lasix 40 mg daily Continue cardiac medications Monitor urine output  GERD Stable Resume Protonix 80 mg twice daily  Iron deficiency anemia Continue iron supplement  Uncontrolled hypertension Continue current medications Stop IV fluid normal saline 03/19/2019 IV hydralazine PRN  NASH -Chronic, currently stable  Physical debility PT to assess Fall precautions    DVT Prophylaxis  subcu Lovenox daily  Code Status: Full  Family Communication: Wife at bedside  Disposition Plan: Possibly home with home health PT on 03/22/2019  Consultants Cardiothoracic surgery, Dr. Lucianne Lei Trigt  Procedures  None     Objective: Vitals:   03/19/19 2317 03/20/19 0251 03/20/19 0409 03/20/19 0832  BP: 129/70 (!) 160/75    Pulse: 78 90  88  Resp: 17 (!) 22  17  Temp: 98.8 F (37.1 C) 99.1 F (37.3 C)    TempSrc: Oral Oral    SpO2: 98% 97%  100%  Weight:   109.3 kg   Height:        Intake/Output Summary (Last 24 hours) at 03/20/2019 0912 Last data filed at 03/20/2019 5188 Gross per 24 hour  Intake 1720 ml  Output 2900 ml  Net -1180 ml   Filed Weights   03/18/19  0800 03/20/19 0409  Weight: 107 kg 109.3 kg    Exam:  . General: 58 y.o. year-old male well-developed well-nourished no acute distress.  Alert oriented x3. . Cardiovascular: Regular rate and rhythm with no rubs or gallops.  No JVD or thyromegaly noted.  Scar noted on mid chest.  .  Respiratory: Clear to auscultation with no wheezes or rales.  Good inspiratory effort. . Abdomen: Soft nontender nondistended with normal bowel sounds x4 quadrants. . Musculoskeletal: 1+ pitting edema lower extremity bilaterally. 2/4 pulses in all 4 extremities. Marland Kitchen Psychiatry: Mood is appropriate for condition and setting   Data Reviewed: CBC: Recent Labs  Lab 03/18/19 0315 03/18/19 0329 03/19/19 0113 03/20/19 0225  WBC 11.9*  --  10.0 9.1  NEUTROABS 8.8*  --   --   --   HGB 8.1* 7.8* 7.8* 7.6*  HCT 27.0* 23.0* 25.0* 25.4*  MCV 91.2  --  89.0 89.1  PLT 455*  --  366 841   Basic Metabolic Panel: Recent Labs  Lab 03/18/19 0315 03/18/19 0329 03/19/19 0113 03/20/19 0225  NA 137 136 137 134*  K 4.2 4.0 3.9 3.6  CL 104  --  104 102  CO2 27  --  21* 25  GLUCOSE 123*  --  110* 111*  BUN 13  --  10 13  CREATININE 1.02  --  0.66 0.81  CALCIUM 9.2  --  9.2 8.9   GFR: Estimated Creatinine Clearance: 119.2 mL/min (by C-G formula based on SCr of 0.81 mg/dL). Liver Function Tests: Recent Labs  Lab 03/18/19 0315  AST 19  ALT 29  ALKPHOS 106  BILITOT 0.5  PROT 6.8  ALBUMIN 2.6*   No results for input(s): LIPASE, AMYLASE in the last 168 hours. No results for input(s): AMMONIA in the last 168 hours. Coagulation Profile: Recent Labs  Lab 03/18/19 0853  INR 1.1   Cardiac Enzymes: No results for input(s): CKTOTAL, CKMB, CKMBINDEX, TROPONINI in the last 168 hours. BNP (last 3 results) No results for input(s): PROBNP in the last 8760 hours. HbA1C: No results for input(s): HGBA1C in the last 72 hours. CBG: No results for input(s): GLUCAP in the last 168 hours. Lipid Profile: No results for input(s): CHOL, HDL, LDLCALC, TRIG, CHOLHDL, LDLDIRECT in the last 72 hours. Thyroid Function Tests: No results for input(s): TSH, T4TOTAL, FREET4, T3FREE, THYROIDAB in the last 72 hours. Anemia Panel: No results for input(s): VITAMINB12, FOLATE, FERRITIN, TIBC, IRON, RETICCTPCT in the  last 72 hours. Urine analysis:    Component Value Date/Time   COLORURINE YELLOW 03/18/2019 [place] 03/18/2019 0455   LABSPEC 1.013 03/18/2019 0455   PHURINE 5.0 03/18/2019 0455   GLUCOSEU NEGATIVE 03/18/2019 0455   HGBUR NEGATIVE 03/18/2019 0455   BILIRUBINUR NEGATIVE 03/18/2019 0455   KETONESUR NEGATIVE 03/18/2019 0455   PROTEINUR NEGATIVE 03/18/2019 0455   UROBILINOGEN 0.2 08/03/2015 0608   NITRITE NEGATIVE 03/18/2019 0455   LEUKOCYTESUR NEGATIVE 03/18/2019 0455   Sepsis Labs: @LABRCNTIP (procalcitonin:4,lacticidven:4)  ) Recent Results (from the past 240 hour(s))  Blood Culture (routine x 2)     Status: None (Preliminary result)   Collection Time: 03/18/19  3:00 AM  Result Value Ref Range Status   Specimen Description BLOOD LEFT ARM  Final   Special Requests   Final    BOTTLES DRAWN AEROBIC AND ANAEROBIC Blood Culture adequate volume   Culture   Final    NO GROWTH 2 DAYS Performed at [facility], [place] [address],  [place], [place] [address]    Report Status PENDING  Incomplete  Blood Culture (routine x 2)     Status: None (Preliminary result)   Collection Time: 03/18/19  3:30 AM  Result Value Ref Range Status   Specimen Description BLOOD RIGHT HAND  Final   Special Requests   Final    BOTTLES DRAWN AEROBIC ONLY Blood Culture adequate volume   Culture   Final    NO GROWTH 2 DAYS Performed at [facility], [place] [address], [place], [place] [address]    Report Status PENDING  Incomplete  Urine culture     Status: None   Collection Time: 03/18/19  4:51 AM  Result Value Ref Range Status   Specimen Description URINE, CLEAN CATCH  Final   Special Requests Normal  Final   Culture   Final    NO GROWTH Performed at [facility], [place] [address], [place], [place] [address]    Report Status 03/19/2019 FINAL  Final  Respiratory Panel by PCR     Status: None   Collection Time: 03/18/19  5:54 AM  Result Value Ref Range Status   Adenovirus  NOT DETECTED NOT DETECTED Final   Coronavirus 229E NOT DETECTED NOT DETECTED Final    Comment: (NOTE) The Coronavirus on the Respiratory Panel, DOES NOT test for the novel  Coronavirus (2019 nCoV)    Coronavirus HKU1 NOT DETECTED NOT DETECTED Final   Coronavirus NL63 NOT DETECTED NOT DETECTED Final   Coronavirus OC43 NOT DETECTED NOT DETECTED Final   Metapneumovirus NOT DETECTED NOT DETECTED Final   Rhinovirus / Enterovirus NOT DETECTED NOT DETECTED Final   Influenza A NOT DETECTED NOT DETECTED Final   Influenza B NOT DETECTED NOT DETECTED Final   Parainfluenza Virus 1 NOT DETECTED NOT DETECTED Final   Parainfluenza Virus 2 NOT DETECTED NOT DETECTED Final   Parainfluenza Virus 3 NOT DETECTED NOT DETECTED Final   Parainfluenza Virus 4 NOT DETECTED NOT DETECTED Final   Respiratory Syncytial Virus NOT DETECTED NOT DETECTED Final   Bordetella pertussis NOT DETECTED NOT DETECTED Final   Chlamydophila pneumoniae NOT DETECTED NOT DETECTED Final   Mycoplasma pneumoniae NOT DETECTED NOT DETECTED Final    Comment: Performed at [facility], [place] [address] [place], [place] [address]  MRSA PCR Screening     Status: None   Collection Time: 03/18/19  9:23 AM  Result Value Ref Range Status   MRSA by PCR NEGATIVE NEGATIVE Final    Comment:        The GeneXpert MRSA Assay (FDA approved for NASAL specimens only), is one component of a comprehensive MRSA colonization surveillance program. It is not intended to diagnose MRSA infection nor to guide or monitor treatment for MRSA infections. Performed at [facility], [place] [address], [place], [place] [address]       Studies: No results found.  Scheduled Meds: . allopurinol  100 mg Oral Daily  . amiodarone  200 mg Oral BID  . atorvastatin  80 mg Oral q1800  . carvedilol  25 mg Oral BID WC  . colchicine  0.6 mg Oral Daily  . enoxaparin (LOVENOX) injection  40 mg Subcutaneous Q24H  . ferrous [id number] C-folic acid  1  capsule Oral TID PC  . furosemide  40 mg Oral Daily  . pantoprazole  80 mg Oral BID  . potassium chloride  40 mEq Oral Daily  . traZODone  100 mg Oral QHS    Continuous  Infusions: . aztreonam 2 g (03/20/19 0830)  . vancomycin 1,750 mg (03/20/19 0423)     LOS: 2 days     Kayleen Memos, MD [facility] Pager [phone]  If 7PM-7AM, please contact night-coverage [email or web address] Password [facility] 03/20/2019, 9:12 AM

## 2019-03-21 ENCOUNTER — Inpatient Hospital Stay (HOSPITAL_COMMUNITY): Payer: Medicare Other

## 2019-03-21 DIAGNOSIS — I313 Pericardial effusion (noninflammatory): Secondary | ICD-10-CM

## 2019-03-21 LAB — URINALYSIS, ROUTINE W REFLEX MICROSCOPIC
Bilirubin Urine: NEGATIVE
Glucose, UA: NEGATIVE mg/dL
Hgb urine dipstick: NEGATIVE
Ketones, ur: NEGATIVE mg/dL
Leukocytes,Ua: NEGATIVE
Nitrite: NEGATIVE
Protein, ur: NEGATIVE mg/dL
Specific Gravity, Urine: 1.011 (ref 1.005–1.030)
pH: 5 (ref 5.0–8.0)

## 2019-03-21 LAB — BASIC METABOLIC PANEL
Anion gap: 9 (ref 5–15)
BUN: 11 mg/dL (ref 6–20)
CO2: 25 mmol/L (ref 22–32)
Calcium: 9.4 mg/dL (ref 8.9–10.3)
Chloride: 101 mmol/L (ref 98–111)
Creatinine, Ser: 0.76 mg/dL (ref 0.61–1.24)
GFR calc Af Amer: >60 mL/min (ref >60)
GFR calc non Af Amer: >60 mL/min (ref >60)
Glucose, Bld: 110 mg/dL — ABNORMAL HIGH (ref 70–99)
Potassium: 4 mmol/L (ref 3.5–5.1)
Sodium: 135 mmol/L (ref 135–145)

## 2019-03-21 LAB — CBC
HCT: 26.8 % — ABNORMAL LOW (ref 39.0–52.0)
Hemoglobin: 8.2 g/dL — ABNORMAL LOW (ref 13.0–17.0)
MCH: 27.1 pg (ref 26.0–34.0)
MCHC: 30.6 g/dL (ref 30.0–36.0)
MCV: 88.4 fL (ref 80.0–100.0)
Platelets: 429 10*3/uL — ABNORMAL HIGH (ref 150–400)
RBC: 3.03 MIL/uL — ABNORMAL LOW (ref 4.22–5.81)
RDW: 14.5 % (ref 11.5–15.5)
WBC: 10.1 10*3/uL (ref 4.0–10.5)
nRBC: 0 % (ref 0.0–0.2)

## 2019-03-21 LAB — VANCOMYCIN, PEAK: Vancomycin Pk: 14 ug/mL — ABNORMAL LOW (ref 30–40)

## 2019-03-21 LAB — PROTIME-INR
INR: 1.2 (ref 0.8–1.2)
Prothrombin Time: 15.2 seconds (ref 11.4–15.2)

## 2019-03-21 LAB — PREPARE RBC (CROSSMATCH)

## 2019-03-21 LAB — ECHOCARDIOGRAM LIMITED
Height: 68 in
Weight: 3880.1 oz

## 2019-03-21 MED ORDER — VANCOMYCIN HCL 10 G IV SOLR: 1500.0000 mg | INTRAVENOUS | Status: DC

## 2019-03-21 MED ORDER — LOSARTAN POTASSIUM 50 MG PO TABS
100.0000 mg | ORAL_TABLET | Freq: Every day | ORAL | Status: DC
  Administered 2019-03-21 – 2019-04-07 (×15): 100 mg via ORAL
  Filled 2019-03-21 (×17): qty 2

## 2019-03-21 MED ORDER — METOLAZONE 5 MG PO TABS
5.0000 mg | ORAL_TABLET | Freq: Once | ORAL | Status: AC
  Administered 2019-03-21: 5 mg via ORAL
  Filled 2019-03-21: qty 1

## 2019-03-21 NOTE — Progress Notes (Addendum)
Pharmacy Antibiotic Note  [name] [name] is a 58 y.o. male admitted on 03/18/2019 with sepsis.  Pharmacy has been consulted for Vancomycin and Aztreonam dosing. New purulent drainage noted from sternal wound, will go to OR for debridement and VAC placement tomorrow AM. Renal function has remained stable.   Plan: Will obtain vancomycin level now, and trough tomorrow AM to further guide therapy Will f/u renal function, micro data, and pt's clinical condition   Height: 5' 8"  (172.7 cm) Weight: 242 lb 8.1 oz (110 kg) IBW/kg (Calculated) : 68.4  Temp (24hrs), Avg:98.8 F (37.1 C), Min:98.1 F (36.7 C), Max:100 F (37.8 C)  Recent Labs  Lab 03/18/19 0315 03/18/19 0332 03/18/19 0643 03/19/19 0113 03/20/19 0225 03/21/19 0211  WBC 11.9*  --   --  10.0 9.1 10.1  CREATININE 1.02  --   --  0.66 0.81 0.76  LATICACIDVEN  --  2.9* 1.4  --   --   --     Estimated Creatinine Clearance: 121 mL/min (by C-G formula based on SCr of 0.76 mg/dL).    Allergies  Allergen Reactions  . Penicillins Hives and Rash    Did it involve swelling of the face/tongue/throat, SOB, or low BP? Yes Did it involve sudden or severe rash/hives, skin peeling, or any reaction on the inside of your mouth or nose? Yes  Did you need to seek medical attention at a hospital or doctor's office? Yes When did it last happen?As a child. If all above answers are "NO", may proceed with cephalosporin use.     Antimicrobials this admission: 3/20 Vanc >>  3/20 Aztreonam >>  3/20 Flagyl >> 3/22  Microbiology results: 3/23 Wound cx: * 3/20 BCx: ngx3 days 3/20 UCx:  ngx3 days RSV panel: neg MRSA PCR: neg    Thank you for allowing pharmacy to be a part of this patient's care.   Claiborne Billings, PharmD PGY2 Cardiology Pharmacy Resident Please check AMION for all Pharmacist numbers by unit 03/21/2019 11:24 AM

## 2019-03-21 NOTE — Plan of Care (Signed)
  Problem: Education: Goal: Knowledge of General Education information will improve Description Including pain rating scale, medication(s)/side effects and non-pharmacologic comfort measures Outcome: Progressing   Problem: Health Behavior/Discharge Planning: Goal: Ability to manage health-related needs will improve Outcome: Progressing   Problem: Clinical Measurements: Goal: Ability to maintain clinical measurements within normal limits will improve Outcome: Progressing Goal: Will remain free from infection Outcome: Progressing Goal: Diagnostic test results will improve Outcome: Progressing Goal: Respiratory complications will improve Outcome: Progressing Goal: Cardiovascular complication will be avoided Outcome: Progressing   Problem: Activity: Goal: Risk for activity intolerance will decrease Outcome: Progressing   Problem: Nutrition: Goal: Adequate nutrition will be maintained Outcome: Progressing   Problem: Coping: Goal: Level of anxiety will decrease Outcome: Progressing   Problem: Pain Managment: Goal: General experience of comfort will improve Outcome: Progressing   Problem: Safety: Goal: Ability to remain free from injury will improve Outcome: Progressing   Problem: Skin Integrity: Goal: Risk for impaired skin integrity will decrease Outcome: Progressing   Problem: Education: Goal: Understanding of cardiac disease, CV risk reduction, and recovery process will improve Outcome: Progressing Goal: Individualized Educational Video(s) Outcome: Progressing   Problem: Activity: Goal: Ability to tolerate increased activity will improve Outcome: Progressing   Problem: Cardiac: Goal: Ability to achieve and maintain adequate cardiovascular perfusion will improve Outcome: Progressing   Problem: Health Behavior/Discharge Planning: Goal: Ability to safely manage health-related needs after discharge will improve Outcome: Progressing

## 2019-03-21 NOTE — Progress Notes (Signed)
  Subjective: Drainage purulent material upper sternal wound Will culture Plan operative debridement and VAC placement in OR tomorrow am  Objective: Vital signs in last 24 hours: Temp:  [98.1 F (36.7 C)-100 F (37.8 C)] 98.4 F (36.9 C) (03/23 0739) Pulse Rate:  [84-94] 92 (03/23 0739) Cardiac Rhythm: Normal sinus rhythm (03/23 0803) Resp:  [17-28] 21 (03/23 0739) BP: (135-160)/(66-82) 157/82 (03/23 0739) SpO2:  [90 %-100 %] 94 % (03/23 0739) Weight:  [110 kg] 110 kg (03/23 0300)  Hemodynamic parameters for last 24 hours:    Intake/Output from previous day: 03/22 0701 - 03/23 0700 In: 3201.7 [P.O.:1220; IV Piggyback:1981.7] Out: 1600 [Urine:1600] Intake/Output this shift: Total I/O In: 266 [IV Piggyback:266] Out: 200 [Urine:200]  nsr Lungs clear No murmur Mild ankle edema Lab Results: Recent Labs    03/20/19 0225 03/21/19 0211  WBC 9.1 10.1  HGB 7.6* 8.2*  HCT 25.4* 26.8*  PLT 388 429*   BMET:  Recent Labs    03/20/19 0225 03/21/19 0211  NA 134* 135  K 3.6 4.0  CL 102 101  CO2 25 25  GLUCOSE 111* 110*  BUN 13 11  CREATININE 0.81 0.76  CALCIUM 8.9 9.4    PT/INR:  Recent Labs    03/18/19 0853  LABPROT 14.5  INR 1.1   ABG    Component Value Date/Time   PHART 7.451 (H) 03/18/2019 0329   HCO3 24.1 03/18/2019 0329   TCO2 25 03/18/2019 0329   ACIDBASEDEF 3.0 (H) 02/24/2019 0155   O2SAT 91.0 03/18/2019 0329   CBG (last 3)  No results for input(s): GLUCAP in the last 72 hours.  Assessment/Plan: S/P  Superficial sternal wound infection Plan debridement, VAC in OR tomorrow   LOS: 3 days    [name] [name] 03/21/2019

## 2019-03-21 NOTE — Progress Notes (Signed)
PROGRESS NOTE  [name] [name] VXB:[id number] DOB: Mar 13, 1961 DOA: 03/18/2019 PCP: [name] Frees, MD  HPI/Recap of past 24 hours: [name] Char Pattersonis a 58 y.o.malewith medical history significant ofCOPD, PAF on amiodarone, not on oral anticoagulation, CAD, admitted for unstable angina at end of Feb, underwent LHC with PCI to 90% RCA lesion, unfortunately this ended up being complicated by type A aortic dissection.  Patient underwent repair of Type A dissection with single vessel RCA CABG (with SVG) and was discharged home. He was scheduled to see Dr. Prescott [name] in officefollow up 03/18/19.  For the past 3 days he has had progressively worsening central CP and SOB. EMS called.  Admitted for sepsis secondary to possible sternal abscess.  Cardiothoracic surgery consulted and following.    Hospital course complicated by postoperative pericarditis and pericardial effusion.  Started on colchicine on 03/19/2019.  On IV vancomycin and IV aztreonam for sternal abscess versus cellulitis.  03/21/19: Patient seen and examined at his bedside.  Purulent discharge noted from his surgical sutures early this morning.  Rates the pain 8 out of 10 prior to taking his pain medications.  Assessment/Plan: Principal Problem:   Abscess of sternal region Active Problems:   Essential hypertension   CAD (coronary artery disease)   History of CVA (cerebrovascular accident)   NASH (nonalcoholic steatohepatitis)   COPD (chronic obstructive pulmonary disease) (HCC)   Aortic dissection (HCC)   S/P ascending aortic aneurysm repair   PAF (paroxysmal atrial fibrillation) (HCC)  Sepsis secondary to suspected sternal abscess  presented with fever, tachycardia, tachypnea CTA chest aorta with and without contrast showed rim-enhancing fluid collections below the sternotomy and rightward of the ascending aortic graft, history suggesting abscess per radiology report. Purulent discharge noted this morning Continue  IV vancomycin and IV Aztreonam (penicillin allergy) Started on colchicine on 03/19/2019 by CTS Plan to repeat 2D echocardiogram on Monday 03/21/2019 Blood cultures x2- to date  Suspected sternal abscess Management as stated above CTS following No new findings on chest x-ray done on 03/21/2019  Acute hypoxic respiratory failure suspect secondary to mild pulmonary edema Independently reviewed chest x-ray done on 03/21/2019 which revealed no signs of lobular infiltrates with cardiomegaly and mild increase in pulmonary vascularity indicative of mild pulmonary edema Continue Lasix Maintain O2 saturation greater than 92% Home O2 evaluation prior to discharge  Coronary artery disease status post CABG Status post one-vessel CABG Continue Lipitor 80 mg daily CTS following Continue cardiac medications  Paroxysmal A. fib Continue amiodarone for rhythm control Continue Coreg for rate control Not on oral anticoagulation  Chronic diastolic CHF Continue strict I's and O's and daily weight Continue p.o. Lasix 40 mg daily Continue cardiac medications Monitor urine output Net I&O -752 cc since admission  GERD Stable Resume Protonix 80 mg twice daily  Iron deficiency anemia Continue iron supplement  Uncontrolled hypertension Continue current medications Stop IV fluid normal saline 03/19/2019 IV hydralazine PRN  NASH -Chronic, currently stable  Physical debility PT to assess Fall precautions    DVT Prophylaxis  subcu Lovenox daily  Code Status: Full  Family Communication: Wife at bedside  Disposition Plan: Possibly home with home health PT when hemodynamically stable possibly in 2 to 3 days or when CTS signs off.    Consultants: Cardiothoracic surgery, Dr. Prescott [name]  Procedures  None     Objective: Vitals:   03/21/19 0004 03/21/19 0300 03/21/19 0600 03/21/19 0739  BP:  (!) 159/79  (!) 157/82  Pulse: 94 93 89 92  Resp:  20 (!) 28 17 (!) 21  Temp:  98.1 F (36.7  C)  98.4 F (36.9 C)  TempSrc:  Oral  Oral  SpO2: 95% 98% 96% 94%  Weight:  110 kg    Height:        Intake/Output Summary (Last 24 hours) at 03/21/2019 0813 Last data filed at 03/21/2019 0800 Gross per 24 hour  Intake 3227.67 ml  Output 1800 ml  Net 1427.67 ml   Filed Weights   03/18/19 0800 03/20/19 0409 03/21/19 0300  Weight: 107 kg 109.3 kg 110 kg    Exam:   General: 58 y.o. year-old male well-developed well-nourished no acute distress.  Appears uncomfortable due to chest discomfort from surgical sutures.  Alert and oriented x3.    Cardiovascular: Regular rate and rhythm with no rubs or gallops.  No JVD or thyromegaly noted.  Scar in mid chest with purulent discharge noted.   Respiratory: Clear to auscultation with no wheezes or rales.  Poor inspiratory effort.    Abdomen: Soft nontender nondistended with normal bowel sounds x4 quadrants.  Musculoskeletal: 1+ pitting edema lower extremity bilaterally. 2/4 pulses in all 4 extremities.  Psychiatry: Mood is appropriate for condition and setting   Data Reviewed: CBC: Recent Labs  Lab 03/18/19 0315 03/18/19 0329 03/19/19 0113 03/20/19 0225 03/21/19 0211  WBC 11.9*  --  10.0 9.1 10.1  NEUTROABS 8.8*  --   --   --   --   HGB 8.1* 7.8* 7.8* 7.6* 8.2*  HCT 27.0* 23.0* 25.0* 25.4* 26.8*  MCV 91.2  --  89.0 89.1 88.4  PLT 455*  --  366 388 440*   Basic Metabolic Panel: Recent Labs  Lab 03/18/19 0315 03/18/19 0329 03/19/19 0113 03/20/19 0225 03/21/19 0211  NA 137 136 137 134* 135  K 4.2 4.0 3.9 3.6 4.0  CL 104  --  104 102 101  CO2 27  --  21* 25 25  GLUCOSE 123*  --  110* 111* 110*  BUN 13  --  10 13 11   CREATININE 1.02  --  0.66 0.81 0.76  CALCIUM 9.2  --  9.2 8.9 9.4   GFR: Estimated Creatinine Clearance: 121 mL/min (by C-G formula based on SCr of 0.76 mg/dL). Liver Function Tests: Recent Labs  Lab 03/18/19 0315  AST 19  ALT 29  ALKPHOS 106  BILITOT 0.5  PROT 6.8  ALBUMIN 2.6*   No results  for input(s): LIPASE, AMYLASE in the last 168 hours. No results for input(s): AMMONIA in the last 168 hours. Coagulation Profile: Recent Labs  Lab 03/18/19 0853  INR 1.1   Cardiac Enzymes: No results for input(s): CKTOTAL, CKMB, CKMBINDEX, TROPONINI in the last 168 hours. BNP (last 3 results) No results for input(s): PROBNP in the last 8760 hours. HbA1C: No results for input(s): HGBA1C in the last 72 hours. CBG: No results for input(s): GLUCAP in the last 168 hours. Lipid Profile: No results for input(s): CHOL, HDL, LDLCALC, TRIG, CHOLHDL, LDLDIRECT in the last 72 hours. Thyroid Function Tests: No results for input(s): TSH, T4TOTAL, FREET4, T3FREE, THYROIDAB in the last 72 hours. Anemia Panel: No results for input(s): VITAMINB12, FOLATE, FERRITIN, TIBC, IRON, RETICCTPCT in the last 72 hours. Urine analysis:    Component Value Date/Time   COLORURINE YELLOW 03/18/2019 [place] 03/18/2019 0455   LABSPEC 1.013 03/18/2019 0455   PHURINE 5.0 03/18/2019 0455   GLUCOSEU NEGATIVE 03/18/2019 0455   HGBUR NEGATIVE 03/18/2019 0455   BILIRUBINUR NEGATIVE  03/18/2019 [place] 03/18/2019 0455   PROTEINUR NEGATIVE 03/18/2019 0455   UROBILINOGEN 0.2 08/03/2015 0608   NITRITE NEGATIVE 03/18/2019 0455   LEUKOCYTESUR NEGATIVE 03/18/2019 0455   Sepsis Labs: @LABRCNTIP (procalcitonin:4,lacticidven:4)  ) Recent Results (from the past 240 hour(s))  Blood Culture (routine x 2)     Status: None (Preliminary result)   Collection Time: 03/18/19  3:00 AM  Result Value Ref Range Status   Specimen Description BLOOD LEFT ARM  Final   Special Requests   Final    BOTTLES DRAWN AEROBIC AND ANAEROBIC Blood Culture adequate volume   Culture   Final    NO GROWTH 2 DAYS Performed at [facility], [place] [address], [place], [place] [address]    Report Status PENDING  Incomplete  Blood Culture (routine x 2)     Status: None (Preliminary result)   Collection Time:  03/18/19  3:30 AM  Result Value Ref Range Status   Specimen Description BLOOD RIGHT HAND  Final   Special Requests   Final    BOTTLES DRAWN AEROBIC ONLY Blood Culture adequate volume   Culture   Final    NO GROWTH 2 DAYS Performed at [facility], [place] [address], [place], [place] [address]    Report Status PENDING  Incomplete  Urine culture     Status: None   Collection Time: 03/18/19  4:51 AM  Result Value Ref Range Status   Specimen Description URINE, CLEAN CATCH  Final   Special Requests Normal  Final   Culture   Final    NO GROWTH Performed at [facility], [place] [address], [place], [place] [address]    Report Status 03/19/2019 FINAL  Final  Respiratory Panel by PCR     Status: None   Collection Time: 03/18/19  5:54 AM  Result Value Ref Range Status   Adenovirus NOT DETECTED NOT DETECTED Final   Coronavirus 229E NOT DETECTED NOT DETECTED Final    Comment: (NOTE) The Coronavirus on the Respiratory Panel, DOES NOT test for the novel  Coronavirus (2019 nCoV)    Coronavirus HKU1 NOT DETECTED NOT DETECTED Final   Coronavirus NL63 NOT DETECTED NOT DETECTED Final   Coronavirus OC43 NOT DETECTED NOT DETECTED Final   Metapneumovirus NOT DETECTED NOT DETECTED Final   Rhinovirus / Enterovirus NOT DETECTED NOT DETECTED Final   Influenza A NOT DETECTED NOT DETECTED Final   Influenza B NOT DETECTED NOT DETECTED Final   Parainfluenza Virus 1 NOT DETECTED NOT DETECTED Final   Parainfluenza Virus 2 NOT DETECTED NOT DETECTED Final   Parainfluenza Virus 3 NOT DETECTED NOT DETECTED Final   Parainfluenza Virus 4 NOT DETECTED NOT DETECTED Final   Respiratory Syncytial Virus NOT DETECTED NOT DETECTED Final   Bordetella pertussis NOT DETECTED NOT DETECTED Final   Chlamydophila pneumoniae NOT DETECTED NOT DETECTED Final   Mycoplasma pneumoniae NOT DETECTED NOT DETECTED Final    Comment: Performed at [facility], [place] [address] [place], [place] [address]  MRSA PCR Screening      Status: None   Collection Time: 03/18/19  9:23 AM  Result Value Ref Range Status   MRSA by PCR NEGATIVE NEGATIVE Final    Comment:        The GeneXpert MRSA Assay (FDA approved for NASAL specimens only), is one component of a comprehensive MRSA colonization surveillance program. It is not intended to diagnose MRSA infection nor to guide or monitor treatment for MRSA infections.  Performed at [facility], [place] [address] [place], [place] [address]       Studies: Dg Chest Port 1 View  Result Date: 03/21/2019 CLINICAL DATA:  Status post coronary bypass graft. EXAM: PORTABLE CHEST 1 VIEW COMPARISON:  Radiograph of March 18, 2019. FINDINGS: Stable cardiomediastinal silhouette. Status post coronary bypass graft. No pneumothorax or pleural effusion is noted. No acute pulmonary disease is noted. Surgical clips are seen in right axillary region. Bony thorax is unremarkable. IMPRESSION: No acute cardiopulmonary abnormality seen. Electronically Signed   By: Marijo Conception, M.D.   On: 03/21/2019 07:24    Scheduled Meds:  allopurinol  100 mg Oral Daily   amiodarone  200 mg Oral BID   atorvastatin  80 mg Oral q1800   carvedilol  25 mg Oral BID WC   colchicine  0.6 mg Oral Daily   enoxaparin (LOVENOX) injection  40 mg Subcutaneous Q24H   ferrous [id number] C-folic acid  1 capsule Oral TID PC   furosemide  40 mg Oral Daily   losartan  100 mg Oral Daily   metolazone  5 mg Oral Once   pantoprazole  80 mg Oral BID   potassium chloride  40 mEq Oral Daily   traZODone  100 mg Oral QHS    Continuous Infusions:  aztreonam Stopped (03/21/19 0544)   vancomycin 250 mL/hr at 03/21/19 0643     LOS: 3 days     Kayleen Memos, MD [facility] Pager [phone]  If 7PM-7AM, please contact night-coverage [email or web address] Password [facility] 03/21/2019, 8:13 AM

## 2019-03-21 NOTE — Care Management (Addendum)
Attending [employer] wound vac application form placed on shadow chart.  Please chose preference for wound vac agency and inform CM of choice  -  fill out and follow up with CM once completed so arrangements can be made to secure equipment for discharge home Elenor Quinones, Therapist, sports, BSN [phone]

## 2019-03-21 NOTE — Progress Notes (Signed)
  Echocardiogram 2D Echocardiogram has been performed.  Jennette Dubin 03/21/2019, 8:51 AM

## 2019-03-21 NOTE — Progress Notes (Signed)
Physical Therapy Treatment Patient Details Name: [name] [name] MRN: [id number] DOB: July 15, 1961 Today's Date: 03/21/2019    History of Present Illness [name] [name] is a 58 y.o. male with medical history significant of COPD, PAF on amiodarone, not on oral anticoagulation, CAD, admitted for unstable angina at end of Feb, underwent LHC with PCI to 90% RCA lesion, unfortunately this ended up being complicated by type A aortic dissection. Went home, then presented with 3 day history of SOB.    PT Comments    Patient seen for activity progression, tolerated well despite elevated HR. Saturations stable. Current POC remains appropriate.   Follow Up Recommendations  No PT follow up     Equipment Recommendations  None recommended by PT    Recommendations for Other Services       Precautions / Restrictions Precautions Precautions: Sternal Precaution Comments: good recall of precautions Restrictions Weight Bearing Restrictions: No Other Position/Activity Restrictions: sternal precautions    Mobility  Bed Mobility               General bed mobility comments: in recliner upon arrival  Transfers Overall transfer level: Needs assistance Equipment used: None Transfers: Sit to/from Stand Sit to Stand: Independent Stand pivot transfers: Independent          Ambulation/Gait Ambulation/Gait assistance: Supervision Gait Distance (Feet): 510 Feet Assistive device: None Gait Pattern/deviations: Step-through pattern;Decreased stride length;Wide base of support Gait velocity: reduced Gait velocity interpretation: 1.31 - 2.62 ft/sec, indicative of limited community ambulator General Gait Details: Saturations stable on room air, DOE 2/4. Steady with ambulation. HR elevation to 120s.(3 standing rest breaks)   Stairs             Wheelchair Mobility    Modified Rankin (Stroke Patients Only)       Balance Overall balance assessment: Needs  assistance Sitting-balance support: Feet supported Sitting balance-Leahy Scale: Good     Standing balance support: Bilateral upper extremity supported;During functional activity Standing balance-Leahy Scale: Fair Standing balance comment: UE support and min guard for static standing                            Cognition Arousal/Alertness: Awake/alert Behavior During Therapy: WFL for tasks assessed/performed Overall Cognitive Status: Within Functional Limits for tasks assessed                                        Exercises      General Comments        Pertinent Vitals/Pain Pain Assessment: 0-10 Pain Score: 3  Pain Location: hip pain Pain Descriptors / Indicators: Discomfort Pain Intervention(s): Monitored during session    Home Living                      Prior Function            PT Goals (current goals can now be found in the care plan section) Acute Rehab PT Goals Patient Stated Goal: to be able to get home and feel better PT Goal Formulation: With patient Time For Goal Achievement: 03/14/19 Potential to Achieve Goals: Good Progress towards PT goals: Progressing toward goals    Frequency    Min 3X/week      PT Plan      Co-evaluation  AM-PAC PT "6 Clicks" Mobility   Outcome Measure  Help needed turning from your back to your side while in a flat bed without using bedrails?: A Little Help needed moving from lying on your back to sitting on the side of a flat bed without using bedrails?: A Little Help needed moving to and from a bed to a chair (including a wheelchair)?: A Little Help needed standing up from a chair using your arms (e.g., wheelchair or bedside chair)?: A Little Help needed to walk in hospital room?: A Little Help needed climbing 3-5 steps with a railing? : A Little 6 Click Score: 18    End of Session Equipment Utilized During Treatment: Gait belt Activity Tolerance: Patient  tolerated treatment well;Treatment limited secondary to medical complications (Comment) Patient left: in chair;with call bell/phone within reach Nurse Communication: Mobility status PT Visit Diagnosis: Unsteadiness on feet (R26.81);Other abnormalities of gait and mobility (R26.89);Muscle weakness (generalized) (M62.81)     Time: 2217-9810 PT Time Calculation (min) (ACUTE ONLY): 21 min  Charges:  $Gait Training: 8-22 mins                     [name] [name], PT DPT  Board Certified Neurologic Specialist [name] [name] [phone] Office [phone]    [name] [name] 03/21/2019, 9:33 AM

## 2019-03-22 ENCOUNTER — Inpatient Hospital Stay (HOSPITAL_COMMUNITY): Payer: Medicare Other | Admitting: Anesthesiology

## 2019-03-22 ENCOUNTER — Encounter (HOSPITAL_COMMUNITY)
Admission: EM | Disposition: A | Discharge status: Home Health | Payer: Self-pay | Source: Home / Self Care | Attending: Cardiothoracic Surgery

## 2019-03-22 ENCOUNTER — Encounter (HOSPITAL_COMMUNITY): Payer: Self-pay | Admitting: Certified Registered Nurse Anesthetist

## 2019-03-22 DIAGNOSIS — I9789 Other postprocedural complications and disorders of the circulatory system, not elsewhere classified: Secondary | ICD-10-CM

## 2019-03-22 HISTORY — PX: APPLICATION OF WOUND VAC: SHX5189

## 2019-03-22 HISTORY — PX: STERNAL WOUND DEBRIDEMENT: SHX1058

## 2019-03-22 LAB — PREPARE RBC (CROSSMATCH)

## 2019-03-22 LAB — VANCOMYCIN, TROUGH: VANCOMYCIN TR: 5 ug/mL — AB (ref 15–20)

## 2019-03-22 LAB — BASIC METABOLIC PANEL
Anion gap: 6 (ref 5–15)
BUN: 13 mg/dL (ref 6–20)
CO2: 27 mmol/L (ref 22–32)
Calcium: 9.3 mg/dL (ref 8.9–10.3)
Chloride: 101 mmol/L (ref 98–111)
Creatinine, Ser: 0.79 mg/dL (ref 0.61–1.24)
GFR calc Af Amer: >60 mL/min (ref >60)
GFR calc non Af Amer: >60 mL/min (ref >60)
Glucose, Bld: 130 mg/dL — ABNORMAL HIGH (ref 70–99)
Potassium: 4 mmol/L (ref 3.5–5.1)
Sodium: 134 mmol/L — ABNORMAL LOW (ref 135–145)

## 2019-03-22 LAB — CBC
HCT: 24.5 % — ABNORMAL LOW (ref 39.0–52.0)
Hemoglobin: 7.6 g/dL — ABNORMAL LOW (ref 13.0–17.0)
MCH: 27.2 pg (ref 26.0–34.0)
MCHC: 31 g/dL (ref 30.0–36.0)
MCV: 87.8 fL (ref 80.0–100.0)
Platelets: 398 10*3/uL (ref 150–400)
RBC: 2.79 MIL/uL — ABNORMAL LOW (ref 4.22–5.81)
RDW: 14.4 % (ref 11.5–15.5)
WBC: 7.7 10*3/uL (ref 4.0–10.5)
nRBC: 0 % (ref 0.0–0.2)

## 2019-03-22 LAB — AEROBIC CULTURE W GRAM STAIN (SUPERFICIAL SPECIMEN)

## 2019-03-22 SURGERY — DEBRIDEMENT, WOUND, STERNUM
Anesthesia: General

## 2019-03-22 MED ORDER — LACTATED RINGERS IV SOLN
INTRAVENOUS | Status: DC
  Administered 2019-03-22 – 2019-03-25 (×4): via INTRAVENOUS

## 2019-03-22 MED ORDER — MIDAZOLAM HCL 5 MG/5ML IJ SOLN
INTRAMUSCULAR | Status: DC | PRN
  Administered 2019-03-22: 2 mg via INTRAVENOUS

## 2019-03-22 MED ORDER — FENTANYL CITRATE (PF) 250 MCG/5ML IJ SOLN
INTRAMUSCULAR | Status: AC
  Filled 2019-03-22: qty 5

## 2019-03-22 MED ORDER — IPRATROPIUM-ALBUTEROL 0.5-2.5 (3) MG/3ML IN SOLN
3.0000 mL | Freq: Two times a day (BID) | RESPIRATORY_TRACT | Status: DC
  Administered 2019-03-23 – 2019-04-01 (×17): 3 mL via RESPIRATORY_TRACT
  Filled 2019-03-22 (×18): qty 3

## 2019-03-22 MED ORDER — LIDOCAINE 2% (20 MG/ML) 5 ML SYRINGE
INTRAMUSCULAR | Status: AC
  Filled 2019-03-22: qty 5

## 2019-03-22 MED ORDER — ONDANSETRON HCL 4 MG/2ML IJ SOLN
INTRAMUSCULAR | Status: AC
  Filled 2019-03-22: qty 2

## 2019-03-22 MED ORDER — ROCURONIUM BROMIDE 100 MG/10ML IV SOLN
INTRAVENOUS | Status: DC | PRN
  Administered 2019-03-22: 70 mg via INTRAVENOUS

## 2019-03-22 MED ORDER — MORPHINE SULFATE (PF) 2 MG/ML IV SOLN
2.0000 mg | Freq: Once | INTRAVENOUS | Status: AC
  Administered 2019-03-22: 2 mg via INTRAVENOUS
  Filled 2019-03-22: qty 1

## 2019-03-22 MED ORDER — PHENYLEPHRINE 40 MCG/ML (10ML) SYRINGE FOR IV PUSH (FOR BLOOD PRESSURE SUPPORT)
PREFILLED_SYRINGE | INTRAVENOUS | Status: AC
  Filled 2019-03-22: qty 10

## 2019-03-22 MED ORDER — SODIUM CHLORIDE 0.9% IV SOLUTION: Freq: Once | INTRAVENOUS | Status: DC

## 2019-03-22 MED ORDER — FENTANYL CITRATE (PF) 100 MCG/2ML IJ SOLN
INTRAMUSCULAR | Status: DC | PRN
  Administered 2019-03-22: 100 ug via INTRAVENOUS
  Administered 2019-03-22: 60 ug via INTRAVENOUS

## 2019-03-22 MED ORDER — ROCURONIUM BROMIDE 50 MG/5ML IV SOSY
PREFILLED_SYRINGE | INTRAVENOUS | Status: AC
  Filled 2019-03-22: qty 5

## 2019-03-22 MED ORDER — DEXAMETHASONE SODIUM PHOSPHATE 10 MG/ML IJ SOLN
INTRAMUSCULAR | Status: AC
  Filled 2019-03-22: qty 1

## 2019-03-22 MED ORDER — PROPOFOL 10 MG/ML IV BOLUS
INTRAVENOUS | Status: AC
  Filled 2019-03-22: qty 20

## 2019-03-22 MED ORDER — ONDANSETRON HCL 4 MG/2ML IJ SOLN
INTRAMUSCULAR | Status: DC | PRN
  Administered 2019-03-22: 4 mg via INTRAVENOUS

## 2019-03-22 MED ORDER — PHENYLEPHRINE HCL 10 MG/ML IJ SOLN
INTRAMUSCULAR | Status: DC | PRN
  Administered 2019-03-22: 120 ug via INTRAVENOUS

## 2019-03-22 MED ORDER — VANCOMYCIN HCL 10 G IV SOLR
2000.0000 mg | Freq: Two times a day (BID) | INTRAVENOUS | Status: DC
  Filled 2019-03-22: qty 2000

## 2019-03-22 MED ORDER — VANCOMYCIN HCL 1000 MG IV SOLR
INTRAVENOUS | Status: DC | PRN
  Administered 2019-03-22: 1000 mg

## 2019-03-22 MED ORDER — VANCOMYCIN HCL 1000 MG IV SOLR
INTRAVENOUS | Status: AC
  Filled 2019-03-22: qty 1000

## 2019-03-22 MED ORDER — SODIUM CHLORIDE 0.9 % IR SOLN
Status: DC | PRN
  Administered 2019-03-22: 1000 mL

## 2019-03-22 MED ORDER — LIDOCAINE HCL (CARDIAC) PF 100 MG/5ML IV SOSY
PREFILLED_SYRINGE | INTRAVENOUS | Status: DC | PRN
  Administered 2019-03-22: 100 mg via INTRAVENOUS

## 2019-03-22 MED ORDER — VANCOMYCIN HCL 10 G IV SOLR
1750.0000 mg | Freq: Two times a day (BID) | INTRAVENOUS | Status: DC
  Administered 2019-03-22 – 2019-03-24 (×4): 1750 mg via INTRAVENOUS
  Filled 2019-03-22 (×6): qty 1750

## 2019-03-22 MED ORDER — SUGAMMADEX SODIUM 200 MG/2ML IV SOLN
INTRAVENOUS | Status: DC | PRN
  Administered 2019-03-22: 400 mg via INTRAVENOUS

## 2019-03-22 MED ORDER — DEXAMETHASONE SODIUM PHOSPHATE 10 MG/ML IJ SOLN
INTRAMUSCULAR | Status: DC | PRN
  Administered 2019-03-22: 5 mg via INTRAVENOUS

## 2019-03-22 MED ORDER — MIDAZOLAM HCL 2 MG/2ML IJ SOLN
INTRAMUSCULAR | Status: AC
  Filled 2019-03-22: qty 2

## 2019-03-22 SURGICAL SUPPLY — 51 items
BENZOIN TINCTURE PRP APPL 2/3 (GAUZE/BANDAGES/DRESSINGS) IMPLANT
BLADE SURG 15 STRL LF DISP TIS (BLADE) IMPLANT
BLADE SURG 15 STRL SS (BLADE)
BNDG GAUZE ELAST 4 BULKY (GAUZE/BANDAGES/DRESSINGS) IMPLANT
CANISTER SUCT 3000ML PPV (MISCELLANEOUS) ×3 IMPLANT
CANISTER WOUNDNEG PRESSURE 500 (CANNISTER) ×3 IMPLANT
CATH FOLEY 2WAY SLVR  5CC 16FR (CATHETERS)
CATH FOLEY 2WAY SLVR 5CC 16FR (CATHETERS) IMPLANT
CATH THORACIC 28FR RT ANG (CATHETERS) IMPLANT
CATH THORACIC 36FR (CATHETERS) IMPLANT
CATH THORACIC 36FR RT ANG (CATHETERS) IMPLANT
CLIP VESOCCLUDE SM WIDE 24/CT (CLIP) IMPLANT
CONN Y 3/8X3/8X3/8  BEN (MISCELLANEOUS)
CONN Y 3/8X3/8X3/8 BEN (MISCELLANEOUS) IMPLANT
CONT SPEC 4OZ CLIKSEAL STRL BL (MISCELLANEOUS) IMPLANT
COVER SURGICAL LIGHT HANDLE (MISCELLANEOUS) ×3 IMPLANT
DRAPE LAPAROSCOPIC ABDOMINAL (DRAPES) ×3 IMPLANT
DRAPE SLUSH/WARMER DISC (DRAPES) IMPLANT
DRAPE WARM FLUID 44X44 (DRAPE) IMPLANT
DRSG PAD ABDOMINAL 8X10 ST (GAUZE/BANDAGES/DRESSINGS) IMPLANT
DRSG VAC ATS MED SENSATRAC (GAUZE/BANDAGES/DRESSINGS) ×3 IMPLANT
ELECT REM PT RETURN 9FT ADLT (ELECTROSURGICAL) ×3
ELECTRODE REM PT RTRN 9FT ADLT (ELECTROSURGICAL) ×1 IMPLANT
GAUZE SPONGE 4X4 12PLY STRL (GAUZE/BANDAGES/DRESSINGS) ×3 IMPLANT
GAUZE XEROFORM 5X9 LF (GAUZE/BANDAGES/DRESSINGS) IMPLANT
GLOVE BIO SURGEON STRL SZ7.5 (GLOVE) ×6 IMPLANT
GOWN STRL REUS W/ TWL LRG LVL3 (GOWN DISPOSABLE) ×4 IMPLANT
GOWN STRL REUS W/TWL LRG LVL3 (GOWN DISPOSABLE) ×8
HEMOSTAT POWDER SURGIFOAM 1G (HEMOSTASIS) IMPLANT
HEMOSTAT SURGICEL 2X14 (HEMOSTASIS) IMPLANT
KIT BASIN OR (CUSTOM PROCEDURE TRAY) ×3 IMPLANT
KIT SUCTION CATH 14FR (SUCTIONS) IMPLANT
KIT TURNOVER KIT B (KITS) ×3 IMPLANT
NS IRRIG 1000ML POUR BTL (IV SOLUTION) ×6 IMPLANT
PACK GENERAL/GYN (CUSTOM PROCEDURE TRAY) ×3 IMPLANT
PAD ARMBOARD 7.5X6 YLW CONV (MISCELLANEOUS) ×6 IMPLANT
SOL PREP POV-IOD 4OZ 10% (MISCELLANEOUS) IMPLANT
STAPLER VISISTAT 35W (STAPLE) IMPLANT
STRAP MONTGOMERY 1.25X11-1/8 (MISCELLANEOUS) IMPLANT
SUT ETHILON 3 0 FSL (SUTURE) IMPLANT
SUT STEEL 6MS V (SUTURE) IMPLANT
SUT STEEL STERNAL CCS#1 18IN (SUTURE) IMPLANT
SUT STEEL SZ 6 DBL 3X14 BALL (SUTURE) IMPLANT
SWAB COLLECTION DEVICE MRSA (MISCELLANEOUS) IMPLANT
SWAB CULTURE ESWAB REG 1ML (MISCELLANEOUS) IMPLANT
SYR 5ML LL (SYRINGE) IMPLANT
TOWEL GREEN STERILE (TOWEL DISPOSABLE) ×3 IMPLANT
TOWEL GREEN STERILE FF (TOWEL DISPOSABLE) ×3 IMPLANT
TRAY FOLEY MTR SLVR 16FR STAT (SET/KITS/TRAYS/PACK) IMPLANT
WATER STERILE IRR 1000ML POUR (IV SOLUTION) ×3 IMPLANT
WND VAC CANISTER 500ML (MISCELLANEOUS) ×3 IMPLANT

## 2019-03-22 NOTE — TOC Initial Note (Signed)
Transition of Care [facility]) - Initial/Assessment Note    Patient Details  Name: [name] [name] MRN: [id number] Date of Birth: [birth year]  Transition of Care [facility]) CM/SW Contact:    Maryclare Labrador, RN Phone Number: 03/22/2019, 12:42 PM  Clinical Narrative: PTA independent from home with wife.  Pt has walker in the home if needed.  Pt has PCP and denied barriers with paying for meds.  Pt in agreement with HH as ordered.  CSW provided medicare.gov list to pt - pt chose [facility] - agency contacted and referral accepted.  Pt is now s/p a wound vac - unclear if pt will need wound vac at discharge.  Both [employer] and [employer] wound vac forms placed and shadow chart for CVTS to complete.        Expected Discharge Plan: [place] Barriers to Discharge: Continued Medical Work up   Patient Goals and CMS Choice Patient states their goals for this hospitalization and ongoing recovery are:: (To get back home to his wife and not get sick with the virus) CMS Medicare.gov Compare Post Acute Care list provided to:: Patient Choice offered to / list presented to : Patient  Expected Discharge Plan and Services Expected Discharge Plan: [place]   Discharge Planning Services: CM Consult                         HH Arranged: RN, Nurse's Aide [facility] Agency: [place] ([facility])  Prior Living Arrangements/Services   Lives with:: Spouse Patient language and need for interpreter reviewed:: Yes Do you feel safe going back to the place where you live?: Yes      Need for Family Participation in Patient Care: Yes (Comment) Care giver support system in place?: Yes (comment)   Criminal Activity/Legal Involvement Pertinent to Current Situation/Hospitalization: No - Comment as needed  Activities of Daily Living Home Assistive Devices/Equipment: Cane (specify quad or straight) ADL Screening (condition at time of admission) Patient's cognitive ability adequate to safely  complete daily activities?: Yes Is the patient deaf or have difficulty hearing?: No Does the patient have difficulty seeing, even when wearing glasses/contacts?: No Does the patient have difficulty concentrating, remembering, or making decisions?: No Patient able to express need for assistance with ADLs?: Yes Does the patient have difficulty dressing or bathing?: No Independently performs ADLs?: Yes (appropriate for developmental age) Does the patient have difficulty walking or climbing stairs?: No Weakness of Legs: None Weakness of Arms/Hands: Both  Permission Sought/Granted Permission sought to share information with : Case Manager                Emotional Assessment   Attitude/Demeanor/Rapport: Engaged, Self-Confident Affect (typically observed): Accepting, Adaptable Orientation: : Oriented to Self, Oriented to Place, Oriented to  Time, Oriented to Situation   Psych Involvement: No (comment)  Admission diagnosis:  Chest pain, unspecified type [R07.9] Sepsis, due to unspecified organism, unspecified whether acute organ dysfunction present [facility]) [A41.9] Patient Active Problem List   Diagnosis Date Noted  . Abscess of sternal region 03/18/2019  . Hx of CABG   . PAF (paroxysmal atrial fibrillation) ([place])   . S/P ascending aortic aneurysm repair 02/24/2019  . Aortic dissection ([place]) 02/23/2019  . Hyperglycemia 02/22/2019  . Sepsis ([place]) 11/24/2018  . Steroid-induced hyperglycemia 11/24/2018  . NASH (nonalcoholic steatohepatitis) 11/24/2018  . COPD (chronic obstructive pulmonary disease) ([place]) 11/24/2018  . Community acquired pneumonia 11/24/2018  . Community acquired pneumonia of right  middle lobe of lung ([place]) 11/23/2018  . Chronic pain of left knee 03/01/2018  . 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 Chronic cholecystitis 08/03/2015  . Barrett's esophagus 05/22/2015  . Abnormal findings on radiological examination of gastrointestinal tract  05/02/2015  . RUQ abdominal pain 05/02/2015  . Steatosis 05/02/2015  . History of CVA (cerebrovascular accident) 10/11/2013  . CVA (cerebral infarction) 05/26/2012  . CAD (coronary artery disease) 05/26/2012  . HEMORRHOIDS-INTERNAL 03/12/2010  . GOUT 08/25/2007  . Essential hypertension 08/25/2007  . GERD 08/25/2007  . History of adenomatous polyp of colon 07/20/2002   PCP:  Shirline Frees, MD Pharmacy:   [place], [place] - [place] [address] [place] [address] [place] [address] Phone: [phone] Fax: [phone]  [facility] [place], [place] - [address] [place] [address] Phone: [phone] Fax: [phone]     Social Determinants of Health (SDOH) Interventions    Readmission Risk Interventions No flowsheet data found.

## 2019-03-22 NOTE — Anesthesia Preprocedure Evaluation (Addendum)
Anesthesia Evaluation  Patient identified by MRN, date of birth, ID band Patient awake    Reviewed: Allergy & Precautions, NPO status , Patient's Chart, lab work & pertinent test results  History of Anesthesia Complications Negative for: history of anesthetic complications  Airway Mallampati: II  TM Distance: >3 FB Neck ROM: Full    Dental  (+) Edentulous Upper, Missing,    Pulmonary asthma , COPD, former smoker,    breath sounds clear to auscultation       Cardiovascular hypertension, Pt. on medications and Pt. on home beta blockers + CAD, + Past MI and + Peripheral Vascular Disease   Rhythm:Regular     Neuro/Psych TIACVA    GI/Hepatic GERD  Medicated and Controlled,(+) Hepatitis -  Endo/Other  negative endocrine ROS  Renal/GU negative Renal ROS     Musculoskeletal  (+) Arthritis ,   Abdominal   Peds  Hematology   Anesthesia Other Findings   Reproductive/Obstetrics                            Anesthesia Physical Anesthesia Plan  ASA: IV  Anesthesia Plan: General   Post-op Pain Management:    Induction: Intravenous  PONV Risk Score and Plan: 2 and Ondansetron and Dexamethasone  Airway Management Planned: Oral ETT  Additional Equipment: None  Intra-op Plan:   Post-operative Plan: Extubation in OR  Informed Consent: I have reviewed the patients History and Physical, chart, labs and discussed the procedure including the risks, benefits and alternatives for the proposed anesthesia with the patient or authorized representative who has indicated his/her understanding and acceptance.     Dental advisory given  Plan Discussed with: CRNA and Surgeon  Anesthesia Plan Comments:        Anesthesia Quick Evaluation

## 2019-03-22 NOTE — Progress Notes (Signed)
Spoke to RN regarding NP order for midline. Pt currently on Vancomycin and has reported burning with previous PIV sites. Recommended PICC as best option due to the above. RN states will notify MD. Informed VAS Team is willing to answer any questions from MD.

## 2019-03-22 NOTE — Op Note (Signed)
NAME: [name] [name], [name] [name] MEDICAL RECORD [id number] ACCOUNT [id number] DATE OF BIRTH:01/24/1961 FACILITY: [facility] LOCATION: [facility] PHYSICIAN:Destyne Goodreau VAN TRIGT III, MD  OPERATIVE REPORT  DATE OF PROCEDURE:  03/22/2019  OPERATION:  Incision and drainage of superficial sternal wound with application of wound vacuum-assisted closure.  SURGEON:  Tharon Aquas Trigt III, MD  PREOPERATIVE DIAGNOSIS:  Superficial sternal wound infection after emergency aortic dissection repair and coronary artery bypass graft x1.  POSTOPERATIVE DIAGNOSIS:  Superficial sternal wound infection after emergency aortic dissection repair and coronary artery bypass graft x1.  ANESTHESIA:  General.  DESCRIPTION OF PROCEDURE:  The patient was brought from preop holding after informed consent was documented and all final questions addressed.  The patient is an obese 58 year old diabetic male who underwent emergency repair of a type A aortic dissection  with combined CABG approximately 4 weeks ago.  He was readmitted with chest wall discomfort and some erythema.  Cultures were negative, and he was placed on antibiotics.  A CT scan showed normal postoperative changes in the retrosternal space.  The  aortic dissection repair is intact without pseudoaneurysm.  An echocardiogram showed a small pericardial effusion with good biventricular function.  The patient developed drainage from the incision 24 hours ago with increased fluctuance and erythema and  was scheduled for this procedure.  I discussed the procedure in detail with the patient including the risks of bleeding, cardiac problems, or reaction to anesthesia.  He agreed to proceed with surgery.  PROCEDURE IN DETAIL:  The patient was brought to the operating room and placed supine on the operating table and general anesthesia was induced.  The patient remained stable.  The chest and upper abdomen were prepped and draped as a sterile field.  A  proper time-out was performed.   The skin was starting to separate and some material was drained.  The skin was opened further, and deep cultures of the subcutaneous tissue were taken.  The incision was opened approximately 60% of the length of the upper  margin inferiorly.  The sternum was intact.  The sternal wires were not exposed.  The wound was irrigated with vancomycin irrigation, and hemostasis was achieved.  A medium wound VAC sponge was cut to the appropriate length and configuration and placed  in the depths of the wound.  Sterile sheets were placed. and the suction line was applied to the [facility] and connected to the console.  The patient was reversed from anesthesia and returned to recovery room in stable condition.  LN/NUANCE  D:03/22/2019 T:03/22/2019 JOB:[id number]

## 2019-03-22 NOTE — Plan of Care (Signed)
  Problem: Education: Goal: Knowledge of General Education information will improve Description Including pain rating scale, medication(s)/side effects and non-pharmacologic comfort measures Outcome: Progressing   Problem: Health Behavior/Discharge Planning: Goal: Ability to manage health-related needs will improve Outcome: Progressing   Problem: Clinical Measurements: Goal: Ability to maintain clinical measurements within normal limits will improve Outcome: Progressing Goal: Will remain free from infection Outcome: Progressing Goal: Diagnostic test results will improve Outcome: Progressing Goal: Respiratory complications will improve Outcome: Progressing Goal: Cardiovascular complication will be avoided Outcome: Progressing   Problem: Activity: Goal: Risk for activity intolerance will decrease Outcome: Progressing   Problem: Nutrition: Goal: Adequate nutrition will be maintained Outcome: Progressing   Problem: Coping: Goal: Level of anxiety will decrease Outcome: Progressing   Problem: Pain Managment: Goal: General experience of comfort will improve Outcome: Progressing   Problem: Safety: Goal: Ability to remain free from injury will improve Outcome: Progressing   Problem: Skin Integrity: Goal: Risk for impaired skin integrity will decrease Outcome: Progressing   Problem: Education: Goal: Understanding of cardiac disease, CV risk reduction, and recovery process will improve Outcome: Progressing Goal: Individualized Educational Video(s) Outcome: Progressing   Problem: Activity: Goal: Ability to tolerate increased activity will improve Outcome: Progressing   Problem: Cardiac: Goal: Ability to achieve and maintain adequate cardiovascular perfusion will improve Outcome: Progressing   Problem: Health Behavior/Discharge Planning: Goal: Ability to safely manage health-related needs after discharge will improve Outcome: Progressing

## 2019-03-22 NOTE — Progress Notes (Signed)
Pre Procedure note for inpatients:   [name] [name] has been scheduled for Procedure(s): STERNAL WOUND DEBRIDEMENT (N/A) APPLICATION OF WOUND VAC (N/A) today. The various methods of treatment have been discussed with the patient. After consideration of the risks, benefits and treatment options the patient has consented to the planned procedure.   The patient has been seen and labs reviewed. There are no changes in the patient's condition to prevent proceeding with the planned procedure today.  Recent labs:  Lab Results  Component Value Date   WBC 7.7 03/22/2019   HGB 7.6 (L) 03/22/2019   HCT 24.5 (L) 03/22/2019   PLT 398 03/22/2019   GLUCOSE 130 (H) 03/22/2019   CHOL 156 02/23/2019   TRIG 101 02/23/2019   HDL 31 (L) 02/23/2019   LDLCALC 105 (H) 02/23/2019   ALT 29 03/18/2019   AST 19 03/18/2019   NA 134 (L) 03/22/2019   K 4.0 03/22/2019   CL 101 03/22/2019   CREATININE 0.79 03/22/2019   BUN 13 03/22/2019   CO2 27 03/22/2019   TSH 4.862 (H) 02/27/2019   INR 1.2 03/21/2019   HGBA1C 6.0 (H) 02/23/2019    Ivin Poot III, MD 03/22/2019 8:16 AM

## 2019-03-22 NOTE — Progress Notes (Signed)
PT Cancellation Note  Patient Details Name: [name] [name] MRN: [id number] DOB: 1961/06/26   Cancelled Treatment:    Reason Eval/Treat Not Completed: Patient at procedure or test/unavailable   Duncan Dull 03/22/2019, 7:20 AM

## 2019-03-22 NOTE — TOC Progression Note (Signed)
Transition of Care [facility]) - Progression Note    Patient Details  Name: [name] [name] MRN: [id number] Date of Birth: 1961/12/10  Transition of Care [facility] CM/SW [place], [place] Phone Number: 03/22/2019, 1:32 PM  Clinical Narrative:    CSW visit with the patient at bedside and provided him with the Medicare.gov [place] listings. Patient choose [facility] as first choice and then [facility].   Expected Discharge Plan: [place] Barriers to Discharge: Continued Medical Work up  Expected Discharge Plan and Services Expected Discharge Plan: [place]   Discharge Planning Services: CM Consult                         HH Arranged: RN, Nurse's Aide [facility] Agency: [place] ([facility])   Social Determinants of Health (SDOH) Interventions    Readmission Risk Interventions No flowsheet data found.

## 2019-03-22 NOTE — Progress Notes (Signed)
[address]       [place] [address]             [phone]                 Day of Surgery Procedure(s) (LRB): STERNAL WOUND DEBRIDEMENT (N/A) APPLICATION OF WOUND VAC (N/A)  LOS: 4 days   Subjective: Asked by nurse to look at patients incision, co of pain, no bleeding  Vac functioning   Objective: Vital signs in last 24 hours: Patient Vitals for the past 24 hrs:  BP Temp Temp src Pulse Resp SpO2 Weight  03/22/19 1821 - - - - - 100 % -  03/22/19 1718 140/66 98.5 F (36.9 C) Oral - - - -  03/22/19 1230 120/82 98.7 F (37.1 C) Oral 92 18 - -  03/22/19 1151 (!) 143/82 98.1 F (36.7 C) Oral 73 17 100 % -  03/22/19 1030 - - - 81 20 98 % -  03/22/19 1020 135/73 - - 79 17 98 % -  03/22/19 1017 135/73 97.9 F (36.6 C) Oral 82 (!) 21 97 % -  03/22/19 1015 - - - 78 18 97 % -  03/22/19 1005 134/69 - - 77 15 98 % -  03/22/19 1002 - 98.1 F (36.7 C) Oral 78 15 99 % -  03/22/19 1000 131/78 - - 78 18 98 % -  03/22/19 0930 130/74 98.7 F (37.1 C) - - (!) 23 94 % -  03/22/19 0441 - - - - - - 106.7 kg  03/22/19 0439 137/77 99.1 F (37.3 C) Oral 86 19 96 % -  03/21/19 2328 138/74 98.5 F (36.9 C) Axillary 77 17 97 % -  03/21/19 1953 119/73 99.1 F (37.3 C) Oral 80 (!) 24 98 % -    Filed Weights   03/20/19 0409 03/21/19 0300 03/22/19 0441  Weight: 109.3 kg 110 kg 106.7 kg    Hemodynamic parameters for last 24 hours:    Intake/Output from previous day: 03/23 0701 - 03/24 0700 In: 1028 [IV Piggyback:1028] Out: 3050 [Urine:3050] Intake/Output this shift: Total I/O In: 1502.5 [P.O.:240; I.V.:900; Blood:362.5] Out: 5830 [Urine:1300; Drains:450; [place]; Blood:50]  Scheduled Meds: . sodium chloride   Intravenous Once  . allopurinol  100 mg Oral Daily  . amiodarone  200 mg Oral BID  . atorvastatin  80 mg Oral q1800  . carvedilol  25 mg Oral BID WC  . colchicine  0.6 mg Oral Daily  . ferrous [id number] C-folic acid  1 capsule Oral TID PC   . furosemide  40 mg Oral Daily  . [START ON 03/23/2019] ipratropium-albuterol  3 mL Nebulization BID  . losartan  100 mg Oral Daily  . pantoprazole  80 mg Oral BID  . traZODone  100 mg Oral QHS   Continuous Infusions: . aztreonam 200 mL/hr at 03/22/19 1819  . lactated ringers 10 mL/hr at 03/22/19 0724  . vancomycin Stopped (03/22/19 1726)   PRN Meds:.acetaminophen **OR** acetaminophen, albuterol, ondansetron **OR** ondansetron (ZOFRAN) IV, oxyCODONE-acetaminophen  General appearance: alert, cooperative and no distress Neurologic: intact Heart: regular rate and rhythm, S1, S2 normal, no murmur, click, rub or gallop Lungs: diminished breath sounds bibasilar Wound: wound vac functioning, no blood in wound vac, around incision mildly red not tight or swollen  Lab Results: CBC: Recent Labs    03/21/19 0211 03/22/19 0215  WBC 10.1 7.7  HGB 8.2* 7.6*  HCT 26.8* 24.5*  PLT 429* 398  BMET:  Recent Labs    03/21/19 0211 03/22/19 0215  NA 135 134*  K 4.0 4.0  CL 101 101  CO2 25 27  GLUCOSE 110* 130*  BUN 11 13  CREATININE 0.76 0.79  CALCIUM 9.4 9.3    PT/INR:  Recent Labs    03/21/19 0842  LABPROT 15.2  INR 1.2     Radiology Dg Chest [facility] 1 View  Result Date: 03/21/2019 CLINICAL DATA:  Status post coronary bypass graft. EXAM: PORTABLE CHEST 1 VIEW COMPARISON:  Radiograph of March 18, 2019. FINDINGS: Stable cardiomediastinal silhouette. Status post coronary bypass graft. No pneumothorax or pleural effusion is noted. No acute pulmonary disease is noted. Surgical clips are seen in right axillary region. Bony thorax is unremarkable. IMPRESSION: No acute cardiopulmonary abnormality seen. Electronically Signed   By: Marijo Conception, M.D.   On: 03/21/2019 07:24     Assessment/Plan: S/P Procedure(s) (LRB): STERNAL WOUND DEBRIDEMENT (N/A) APPLICATION OF WOUND VAC (N/A) Stable wound after vac placement    Grace Isaac MD 03/22/2019 6:34 PM

## 2019-03-22 NOTE — Progress Notes (Signed)
PROGRESS NOTE  [name] [name] MHW:[id number] DOB: [birth year] DOA: 03/18/2019 PCP: Shirline Frees, MD  HPI/Recap of past 24 hours: [name] Char Pattersonis a 58 y.o.malewith medical history significant ofCOPD, PAF on amiodarone, not on oral anticoagulation, CAD, admitted for unstable angina at end of Feb, underwent LHC with PCI to 90% RCA lesion, unfortunately this ended up being complicated by type A aortic dissection.  Patient underwent repair of Type A dissection with single vessel RCA CABG (with SVG) and was discharged home. He was scheduled to see Dr. Prescott Gum in officefollow up 03/18/19.  For the past 3 days he has had progressively worsening central CP and SOB. EMS called.  Admitted for sepsis secondary to possible sternal abscess.  Cardiothoracic surgery consulted and following.    Hospital course complicated by postoperative pericarditis and pericardial effusion.  Started on colchicine on 03/19/2019.  On IV vancomycin and IV aztreonam for sternal abscess versus cellulitis.  Purulent discharge noted on 03/21/2019.  Plan for debridement on 03/22/2019 by CTS.  03/22/19: Patient seen and examined at his bedside.  He slept in his chair where he is comfortable sleeping.  States his sternal pain is improved with pain medications 6 out of 10 from 8 out of 10 yesterday.  No dyspnea.  Plan for debridement of upper sternal wound and VAC placement in OR today by CTS.   Assessment/Plan: Principal Problem:   Abscess of sternal region Active Problems:   Essential hypertension   CAD (coronary artery disease)   History of CVA (cerebrovascular accident)   NASH (nonalcoholic steatohepatitis)   COPD (chronic obstructive pulmonary disease) (HCC)   Aortic dissection (HCC)   S/P ascending aortic aneurysm repair   PAF (paroxysmal atrial fibrillation) (HCC)  Sepsis secondary to upper sternal wound presented with fever, tachycardia, tachypnea CTA chest aorta with and without contrast showed  rim-enhancing fluid collections below the sternotomy and rightward of the ascending aortic graft, history suggesting abscess per radiology report. 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 Repeated 2D echo done on 03/21/2019 revealed moderate pericardial effusion and normal LVEF with concern for an effusive/constrictive pericarditis picture On colchicine per CTS Defer management by CTS Pericardial effusion first seen on 2D echo done in February 2020 Continue close monitoring of vital signs in progressive care  Resolving acute hypoxic respiratory failure suspect secondary to mild pulmonary edema Independently reviewed chest x-ray done on 03/21/2019 which revealed no signs of lobular infiltrates with cardiomegaly and mild increase in pulmonary vascularity indicative of mild pulmonary edema Continue Lasix Maintain O2 saturation greater than 92% Home O2 evaluation prior to discharge  Coronary artery disease status post CABG Status post one-vessel CABG Continue Lipitor 80 mg daily CTS following Continue cardiac medications  Paroxysmal A. fib Continue amiodarone for rhythm control Continue Coreg for rate control Not on oral anticoagulation  Iron deficiency anemia Hemoglobin 7.6 with MCV of 87 from hemoglobin 8.2 PRBC transfusion per cardiology Repeat CBC in the morning Continue iron supplement  Chronic diastolic CHF Continue strict I's and O's and daily weight Continue p.o. Lasix  40 mg daily  Continue cardiac medications Monitor urine output Net I&O -752 cc since admission  GERD Stable Resume Protonix 80 mg twice daily  Uncontrolled hypertension Continue current medications Stop IV fluid normal saline 03/19/2019 IV hydralazine PRN  NASH -Chronic, currently stable  Improving physical debility PT assessed and had no recommendations for PT follow-up      DVT Prophylaxis  subcu Lovenox daily  Code Status: Full  Family Communication:  None at bedside  Disposition Plan: Possibly home with home health PT when hemodynamically stable possibly in 2 to 3 days or when CTS signs off.    Consultants: Cardiothoracic surgery, Dr. Prescott Gum  Procedures:  Upper sternal wound debridement planned on 03/22/2019 by CTS     Objective: Vitals:   03/21/19 1953 03/21/19 2328 03/22/19 0439 03/22/19 0441  BP: 119/73 138/74 137/77   Pulse: 80 77 86   Resp: (!) 24 17 19    Temp: 99.1 F (37.3 C) 98.5 F (36.9 C) 99.1 F (37.3 C)   TempSrc: Oral Axillary Oral   SpO2: 98% 97% 96%   Weight:    106.7 kg  Height:        Intake/Output Summary (Last 24 hours) at 03/22/2019 0809 Last data filed at 03/22/2019 0630 Gross per 24 hour  Intake 762.04 ml  Output 2850 ml  Net -2087.96 ml   Filed Weights   03/20/19 0409 03/21/19 0300 03/22/19 0441  Weight: 109.3 kg 110 kg 106.7 kg    Exam:  . General: 58 y.o. year-old male well-developed well-nourished no acute distress.  Alert and oriented x3. . Cardiovascular: Regular rate and rhythm with no rubs or gallops.  No JVD or thyromegaly.  Scar in sternum from prior surgery. Marland Kitchen Respiratory: Clear to auscultation with no wheezes or rales.  Poor inspiratory effort. . Abdomen: Soft obese nontender nondistended with normal bowel sounds x4 quadrants. . Musculoskeletal: 1+ pitting edema in lower extremities bilaterally.   Marland Kitchen Psychiatry: Mood is appropriate for condition and setting   Data Reviewed: CBC: Recent Labs  Lab  03/18/19 0315 03/18/19 0329 03/19/19 0113 03/20/19 0225 03/21/19 0211 03/22/19 0215  WBC 11.9*  --  10.0 9.1 10.1 7.7  NEUTROABS 8.8*  --   --   --   --   --   HGB 8.1* 7.8* 7.8* 7.6* 8.2* 7.6*  HCT 27.0* 23.0* 25.0* 25.4* 26.8* 24.5*  MCV 91.2  --  89.0 89.1 88.4 87.8  PLT 455*  --  366 388 429* 426   Basic Metabolic Panel: Recent Labs  Lab 03/18/19 0315 03/18/19 0329 03/19/19 0113 03/20/19 0225 03/21/19 0211 03/22/19 0215  NA 137 136 137 134* 135 134*  K 4.2 4.0 3.9 3.6 4.0 4.0  CL 104  --  104 102 101 101  CO2 27  --  21* 25 25 27   GLUCOSE 123*  --  110* 111* 110* 130*  BUN 13  --  10 13 11 13   CREATININE 1.02  --  0.66 0.81 0.76 0.79  CALCIUM 9.2  --  9.2 8.9 9.4 9.3   GFR: Estimated Creatinine Clearance: 119.2 mL/min (by C-G formula based on SCr of 0.79 mg/dL). Liver Function Tests: Recent Labs  Lab 03/18/19 0315  AST 19  ALT 29  ALKPHOS 106  BILITOT 0.5  PROT 6.8  ALBUMIN 2.6*   No results for input(s): LIPASE, AMYLASE in the last 168 hours. No results for input(s): AMMONIA in the last 168 hours. Coagulation Profile: Recent Labs  Lab 03/18/19 0853 03/21/19 0842  INR  1.1 1.2   Cardiac Enzymes: No results for input(s): CKTOTAL, CKMB, CKMBINDEX, TROPONINI in the last 168 hours. BNP (last 3 results) No results for input(s): PROBNP in the last 8760 hours. HbA1C: No results for input(s): HGBA1C in the last 72 hours. CBG: No results for input(s): GLUCAP in the last 168 hours. Lipid Profile: No results for input(s): CHOL, HDL, LDLCALC, TRIG, CHOLHDL, LDLDIRECT in the last 72 hours. Thyroid Function Tests: No results for input(s): TSH, T4TOTAL, FREET4, T3FREE, THYROIDAB in the last 72 hours. Anemia Panel: No results for input(s): VITAMINB12, FOLATE, FERRITIN, TIBC, IRON, RETICCTPCT in the last 72 hours. Urine analysis:    Component Value Date/Time   COLORURINE YELLOW 03/21/2019 0900   APPEARANCEUR CLEAR 03/21/2019 0900   LABSPEC 1.011 03/21/2019  0900   PHURINE 5.0 03/21/2019 0900   GLUCOSEU NEGATIVE 03/21/2019 0900   HGBUR NEGATIVE 03/21/2019 0900   BILIRUBINUR NEGATIVE 03/21/2019 0900   KETONESUR NEGATIVE 03/21/2019 0900   PROTEINUR NEGATIVE 03/21/2019 0900   UROBILINOGEN 0.2 08/03/2015 0608   NITRITE NEGATIVE 03/21/2019 0900   LEUKOCYTESUR NEGATIVE 03/21/2019 0900   Sepsis Labs: @LABRCNTIP (procalcitonin:4,lacticidven:4)  ) Recent Results (from the past 240 hour(s))  Blood Culture (routine x 2)     Status: None (Preliminary result)   Collection Time: 03/18/19  3:00 AM  Result Value Ref Range Status   Specimen Description BLOOD LEFT ARM  Final   Special Requests   Final    BOTTLES DRAWN AEROBIC AND ANAEROBIC Blood Culture adequate volume   Culture   Final    NO GROWTH 3 DAYS Performed at [facility], [place] [address] [place], [place] [address]    Report Status PENDING  Incomplete  Blood Culture (routine x 2)     Status: None (Preliminary result)   Collection Time: 03/18/19  3:30 AM  Result Value Ref Range Status   Specimen Description BLOOD RIGHT HAND  Final   Special Requests   Final    BOTTLES DRAWN AEROBIC ONLY Blood Culture adequate volume   Culture   Final    NO GROWTH 3 DAYS Performed at [facility], [place] [address] [place], [place] [address]    Report Status PENDING  Incomplete  Urine culture     Status: None   Collection Time: 03/18/19  4:51 AM  Result Value Ref Range Status   Specimen Description URINE, CLEAN CATCH  Final   Special Requests Normal  Final   Culture   Final    NO GROWTH Performed at [facility], [place] [address], [place], [place] [address]    Report Status 03/19/2019 FINAL  Final  Respiratory Panel by PCR     Status: None   Collection Time: 03/18/19  5:54 AM  Result Value Ref Range Status   Adenovirus NOT DETECTED NOT DETECTED Final   Coronavirus 229E NOT DETECTED NOT DETECTED Final    Comment: (NOTE) The Coronavirus on the Respiratory Panel, DOES NOT test for  the novel  Coronavirus (2019 nCoV)    Coronavirus HKU1 NOT DETECTED NOT DETECTED Final   Coronavirus NL63 NOT DETECTED NOT DETECTED Final   Coronavirus OC43 NOT DETECTED NOT DETECTED Final   Metapneumovirus NOT DETECTED NOT DETECTED Final   Rhinovirus / Enterovirus NOT DETECTED NOT DETECTED Final   Influenza A NOT DETECTED NOT DETECTED Final   Influenza B NOT DETECTED NOT DETECTED Final   Parainfluenza Virus 1 NOT DETECTED NOT DETECTED Final   Parainfluenza Virus 2 NOT DETECTED NOT DETECTED Final  Parainfluenza Virus 3 NOT DETECTED NOT DETECTED Final   Parainfluenza Virus 4 NOT DETECTED NOT DETECTED Final   Respiratory Syncytial Virus NOT DETECTED NOT DETECTED Final   Bordetella pertussis NOT DETECTED NOT DETECTED Final   Chlamydophila pneumoniae NOT DETECTED NOT DETECTED Final   Mycoplasma pneumoniae NOT DETECTED NOT DETECTED Final    Comment: Performed at [facility], [place] [address] [place], [place] [address]  MRSA PCR Screening     Status: None   Collection Time: 03/18/19  9:23 AM  Result Value Ref Range Status   MRSA by PCR NEGATIVE NEGATIVE Final    Comment:        The GeneXpert MRSA Assay (FDA approved for NASAL specimens only), is one component of a comprehensive MRSA colonization surveillance program. It is not intended to diagnose MRSA infection nor to guide or monitor treatment for MRSA infections. Performed at [facility], [place] [address] [place], [place] [address]   Aerobic Culture (superficial specimen)     Status: None (Preliminary result)   Collection Time: 03/21/19  8:29 AM  Result Value Ref Range Status   Specimen Description CHEST  Final   Special Requests Normal  Final   Gram Stain   Final    FEW WBC PRESENT, PREDOMINANTLY PMN NO ORGANISMS SEEN Performed at [facility], [place] [address], [place], [place] [address]    Culture PENDING  Incomplete   Report Status PENDING  Incomplete      Studies: No results found.  Scheduled  Meds: . [MAR Hold] allopurinol  100 mg Oral Daily  . [MAR Hold] amiodarone  200 mg Oral BID  . [MAR Hold] atorvastatin  80 mg Oral q1800  . [MAR Hold] carvedilol  25 mg Oral BID WC  . [MAR Hold] colchicine  0.6 mg Oral Daily  . [MAR Hold] enoxaparin (LOVENOX) injection  40 mg Subcutaneous Q24H  . [MAR Hold] ferrous [id number] C-folic acid  1 capsule Oral TID PC  . [MAR Hold] furosemide  40 mg Oral Daily  . [MAR Hold] losartan  100 mg Oral Daily  . [MAR Hold] pantoprazole  80 mg Oral BID  . [MAR Hold] traZODone  100 mg Oral QHS    Continuous Infusions: . [MAR Hold] aztreonam 2 g (03/22/19 3299)  . lactated ringers 10 mL/hr at 03/22/19 0724  . [MAR Hold] vancomycin 1,750 mg (03/22/19 0421)     LOS: 4 days     Kayleen Memos, MD [facility] Pager [phone]  If 7PM-7AM, please contact night-coverage [email or web address] Password [email or web address] 03/22/2019, 8:09 AM

## 2019-03-22 NOTE — Transfer of Care (Signed)
Immediate Anesthesia Transfer of Care Note  Patient: [name] [name]  Procedure(s) Performed: STERNAL WOUND DEBRIDEMENT (N/A ) APPLICATION OF WOUND VAC (N/A )  Patient Location: PACU  Anesthesia Type:General  Level of Consciousness: awake and alert   Airway & Oxygen Therapy: Patient Spontanous Breathing and Patient connected to nasal cannula oxygen  Post-op Assessment: Report given to RN, Post -op Vital signs reviewed and stable and Patient moving all extremities  Post vital signs: Reviewed and stable  Last Vitals:  Vitals Value Taken Time  BP 130/74 03/22/2019  9:35 AM  Temp    Pulse 78 03/22/2019  9:37 AM  Resp 21 03/22/2019  9:37 AM  SpO2 94 % 03/22/2019  9:37 AM  Vitals shown include unvalidated device data.  Last Pain:  Vitals:   03/22/19 0538  TempSrc:   PainSc: 3       Patients Stated Pain Goal: 3 (97/47/18 5501)  Complications: No apparent anesthesia complications

## 2019-03-22 NOTE — Progress Notes (Signed)
Paged On call NP about placing a midline or a PICC due to patient receiving prolonged antibiotic therapy. On call NP put in order for Midline. Spoke with IV team regading placement of PICC vs midline due to the length of time the pt will be receiving vancomycin. IV Team recommended a PICC over a midline. Paged on call NP regarding IV Team concerns with Midline. Awaiting orders.  Tressie Ellis, RN

## 2019-03-22 NOTE — Anesthesia Postprocedure Evaluation (Signed)
Anesthesia Post Note  Patient: [name] [name]  Procedure(s) Performed: STERNAL WOUND DEBRIDEMENT (N/A ) APPLICATION OF WOUND VAC (N/A )     Patient location during evaluation: PACU Anesthesia Type: General Level of consciousness: awake and alert Pain management: pain level controlled Vital Signs Assessment: post-procedure vital signs reviewed and stable Respiratory status: spontaneous breathing, nonlabored ventilation, respiratory function stable and patient connected to nasal cannula oxygen Cardiovascular status: blood pressure returned to baseline and stable Postop Assessment: no apparent nausea or vomiting Anesthetic complications: no    Last Vitals:  Vitals:   03/22/19 1030 03/22/19 1151  BP:  (!) 143/82  Pulse: 81 73  Resp: 20 17  Temp:  36.7 C  SpO2: 98% 100%    Last Pain:  Vitals:   03/22/19 1151  TempSrc: Oral  PainSc:                  [name]

## 2019-03-22 NOTE — Progress Notes (Addendum)
Pharmacy Antibiotic Note  [name] [name] is a 58 y.o. male admitted on 03/18/2019 with sepsis.  Pharmacy has been consulted for Vancomycin and Aztreonam dosing. New purulent drainage noted from sternal wound, will go to OR for debridement and VAC placement on 3/24.  Currently on day#5 of antibiotics. Vancomycin peak came back at 14 with trough at 5 today. Scr 0.79 (CrCl>100 mL/min). WBC WNL.    Plan: Will adjust order to 1750 mg IV every 12 hours starting on 3/24@1600  Will f/u renal function, micro data, and pt's clinical condition   Height: 5' 8"  (172.7 cm) Weight: 235 lb 3.7 oz (106.7 kg) IBW/kg (Calculated) : 68.4  Temp (24hrs), Avg:98.7 F (37.1 C), Min:97.9 F (36.6 C), Max:99.1 F (37.3 C)  Recent Labs  Lab 03/18/19 0315 03/18/19 0332 03/18/19 0643 03/19/19 0113 03/20/19 0225 03/21/19 0211 03/21/19 1155 03/22/19 0215  WBC 11.9*  --   --  10.0 9.1 10.1  --  7.7  CREATININE 1.02  --   --  0.66 0.81 0.76  --  0.79  LATICACIDVEN  --  2.9* 1.4  --   --   --   --   --   VANCOTROUGH  --   --   --   --   --   --   --  5*  VANCOPEAK  --   --   --   --   --   --  14*  --     Estimated Creatinine Clearance: 119.2 mL/min (by C-G formula based on SCr of 0.79 mg/dL).    Allergies  Allergen Reactions  . Penicillins Hives and Rash    Did it involve swelling of the face/tongue/throat, SOB, or low BP? Yes Did it involve sudden or severe rash/hives, skin peeling, or any reaction on the inside of your mouth or nose? Yes  Did you need to seek medical attention at a hospital or doctor's office? Yes When did it last happen?As a child. If all above answers are "NO", may proceed with cephalosporin use.    Antimicrobials this admission: 3/20 Vanc >>  3/20 Aztreonam >>  3/20 Flagyl >> 3/22  Microbiology results: 3/23 Wound cx: ngx1 day 3/20 BCx: ngx3 days 3/20 UCx:  ngx3 days RSV panel: neg MRSA PCR: neg   Thank you for allowing pharmacy to be a part of this patient's  care.  Antonietta Jewel, PharmD, BCCCP Clinical Pharmacist  Pager: [phone] Phone: [phone] Please check AMION for all Pharmacist numbers by unit 03/22/2019 11:47 AM

## 2019-03-22 NOTE — Anesthesia Procedure Notes (Signed)
Procedure Name: Intubation Date/Time: 03/22/2019 8:36 AM Performed by: Goebel Hellums T, CRNA Pre-anesthesia Checklist: Patient identified, Emergency Drugs available, Suction available and Patient being monitored Patient Re-evaluated:Patient Re-evaluated prior to induction Oxygen Delivery Method: Circle system utilized Preoxygenation: Pre-oxygenation with 100% oxygen Induction Type: IV induction Ventilation: Mask ventilation without difficulty Laryngoscope Size: Mac and 4 Grade View: Grade I Tube type: Oral Tube size: 7.5 mm Number of attempts: 1 Airway Equipment and Method: Patient positioned with wedge pillow and Stylet Placement Confirmation: ETT inserted through vocal cords under direct vision,  positive ETCO2 and breath sounds checked- equal and bilateral Secured at: 22 cm Tube secured with: Tape Dental Injury: Teeth and Oropharynx as per pre-operative assessment

## 2019-03-22 NOTE — Brief Op Note (Signed)
03/22/2019  9:40 AM  PATIENT:  [name] [name]  58 y.o. male  PRE-OPERATIVE DIAGNOSIS:  SUPERFICIAL STERNAL WOUND INFECTION  POST-OPERATIVE DIAGNOSIS:  SUPERFICIAL STERNAL WOUND INFECTION  PROCEDURE:  Procedure(s): STERNAL WOUND DEBRIDEMENT (N/A) APPLICATION OF WOUND VAC (N/A)  SURGEON:  Surgeon(s) and Role:    Ivin Poot, MD - Primary  PHYSICIAN ASSISTANT: none  ASSISTANTS: none  ANESTHESIA:   general  EBL:  50 mL   BLOOD ADMINISTERED:none  DRAINS: wound vac medium sponge   LOCAL MEDICATIONS USED:  NONE  SPECIMEN:  Source of Specimen:  sternal wound and Aspirate  DISPOSITION OF SPECIMEN:  microbiology  COUNTS:  YES  TOURNIQUET:  * No tourniquets in log *  DICTATION: .Dragon Dictation  PLAN OF CARE: return to 2 C  PATIENT DISPOSITION:  PACU - hemodynamically stable.   Delay start of Pharmacological VTE agent (>24hrs) due to surgical blood loss or risk of bleeding: yes

## 2019-03-23 ENCOUNTER — Encounter (HOSPITAL_COMMUNITY): Payer: Self-pay | Admitting: Cardiothoracic Surgery

## 2019-03-23 ENCOUNTER — Inpatient Hospital Stay: Payer: Self-pay

## 2019-03-23 LAB — AEROBIC CULTURE W GRAM STAIN (SUPERFICIAL SPECIMEN)
Culture: NO GROWTH
Special Requests: NORMAL

## 2019-03-23 LAB — CBC
HCT: 27 % — ABNORMAL LOW (ref 39.0–52.0)
Hemoglobin: 8.3 g/dL — ABNORMAL LOW (ref 13.0–17.0)
MCH: 27 pg (ref 26.0–34.0)
MCHC: 30.7 g/dL (ref 30.0–36.0)
MCV: 87.9 fL (ref 80.0–100.0)
Platelets: 423 10*3/uL — ABNORMAL HIGH (ref 150–400)
RBC: 3.07 MIL/uL — ABNORMAL LOW (ref 4.22–5.81)
RDW: 14.5 % (ref 11.5–15.5)
WBC: 6.9 10*3/uL (ref 4.0–10.5)
nRBC: 0 % (ref 0.0–0.2)

## 2019-03-23 LAB — BASIC METABOLIC PANEL
Anion gap: 7 (ref 5–15)
BUN: 11 mg/dL (ref 6–20)
CO2: 28 mmol/L (ref 22–32)
Calcium: 9.4 mg/dL (ref 8.9–10.3)
Chloride: 102 mmol/L (ref 98–111)
Creatinine, Ser: 0.79 mg/dL (ref 0.61–1.24)
GFR calc Af Amer: >60 mL/min (ref >60)
GFR calc non Af Amer: >60 mL/min (ref >60)
Glucose, Bld: 128 mg/dL — ABNORMAL HIGH (ref 70–99)
Potassium: 4.4 mmol/L (ref 3.5–5.1)
Sodium: 137 mmol/L (ref 135–145)

## 2019-03-23 LAB — CULTURE, BLOOD (ROUTINE X 2)
Culture: NO GROWTH
Culture: NO GROWTH
SPECIAL REQUESTS: ADEQUATE
Special Requests: ADEQUATE

## 2019-03-23 LAB — ACID FAST SMEAR (AFB, MYCOBACTERIA): Acid Fast Smear: NEGATIVE

## 2019-03-23 MED ORDER — MORPHINE SULFATE (PF) 2 MG/ML IV SOLN
2.0000 mg | Freq: Every day | INTRAVENOUS | Status: DC | PRN
  Administered 2019-03-28: 2 mg via INTRAVENOUS
  Filled 2019-03-23 (×2): qty 1

## 2019-03-23 NOTE — Plan of Care (Signed)
  Problem: Education: Goal: Knowledge of General Education information will improve Description Including pain rating scale, medication(s)/side effects and non-pharmacologic comfort measures Outcome: Progressing   Problem: Health Behavior/Discharge Planning: Goal: Ability to manage health-related needs will improve Outcome: Progressing   Problem: Clinical Measurements: Goal: Ability to maintain clinical measurements within normal limits will improve Outcome: Progressing Goal: Will remain free from infection Outcome: Progressing Goal: Diagnostic test results will improve Outcome: Progressing Goal: Respiratory complications will improve Outcome: Progressing Goal: Cardiovascular complication will be avoided Outcome: Progressing   Problem: Activity: Goal: Risk for activity intolerance will decrease Outcome: Progressing   Problem: Nutrition: Goal: Adequate nutrition will be maintained Outcome: Progressing   Problem: Coping: Goal: Level of anxiety will decrease Outcome: Progressing   Problem: Pain Managment: Goal: General experience of comfort will improve Outcome: Progressing   Problem: Safety: Goal: Ability to remain free from injury will improve Outcome: Progressing   Problem: Skin Integrity: Goal: Risk for impaired skin integrity will decrease Outcome: Progressing   Problem: Education: Goal: Understanding of cardiac disease, CV risk reduction, and recovery process will improve Outcome: Progressing Goal: Individualized Educational Video(s) Outcome: Progressing   Problem: Activity: Goal: Ability to tolerate increased activity will improve Outcome: Progressing   Problem: Cardiac: Goal: Ability to achieve and maintain adequate cardiovascular perfusion will improve Outcome: Progressing   Problem: Health Behavior/Discharge Planning: Goal: Ability to safely manage health-related needs after discharge will improve Outcome: Progressing

## 2019-03-23 NOTE — Progress Notes (Addendum)
Physical Therapy Treatment Patient Details Name: [name] [name] MRN: [id number] DOB: [birth year] Today's Date: 03/23/2019    History of Present Illness [name] [name] is a 58 y.o. male with medical history significant of COPD, PAF on amiodarone, not on oral anticoagulation, CAD, admitted for unstable angina at end of Feb, underwent LHC with PCI to 90% RCA lesion, unfortunately this ended up being complicated by type A aortic dissection. Went home, then presented with 3 day history of SOB. I&D of superficial wound with wound vac placement on 03/22/19.     PT Comments    Patient seen for continued mobility progression. Patient only with subjective complaints of  L shoulder and R hand weakness. Patient ambulating in hallway with RW with good tolerance and VSS. Good recall of precautions throughout session. Will put in order for OT consult, due to reports of difficulty with L shoulder (weakness, bringing cup to mouth). PT to continue to follow acutely.    Follow Up Recommendations  No PT follow up     Equipment Recommendations  None recommended by PT    Recommendations for Other Services OT consult     Precautions / Restrictions Precautions Precautions: Sternal Precaution Comments: good recall of precautions Restrictions Weight Bearing Restrictions: No Other Position/Activity Restrictions: sternal precautions    Mobility  Bed Mobility               General bed mobility comments: in recliner upon arrival  Transfers Overall transfer level: Needs assistance Equipment used: None;Rolling walker (2 wheeled) Transfers: Sit to/from Stand Sit to Stand: Supervision         General transfer comment: supervision for safety; able to stand with good recall of precautions  Ambulation/Gait Ambulation/Gait assistance: Supervision Gait Distance (Feet): 500 Feet Assistive device: Rolling walker (2 wheeled) Gait Pattern/deviations: Step-through pattern;Decreased stride  length;Wide base of support Gait velocity: reduced   General Gait Details: VSS throughout with patient denying SOB; steady gait with RW - wants to start ambulating more without RW   Stairs             Wheelchair Mobility    Modified Rankin (Stroke Patients Only)       Balance Overall balance assessment: Needs assistance Sitting-balance support: Feet supported;No upper extremity supported Sitting balance-Leahy Scale: Good     Standing balance support: Bilateral upper extremity supported;During functional activity Standing balance-Leahy Scale: Fair                              Cognition Arousal/Alertness: Awake/alert Behavior During Therapy: WFL for tasks assessed/performed Overall Cognitive Status: Within Functional Limits for tasks assessed                                        Exercises      General Comments General comments (skin integrity, edema, etc.): MD and PA in room upon PT arrival      Pertinent Vitals/Pain Pain Assessment: No/denies pain    Home Living                      Prior Function            PT Goals (current goals can now be found in the care plan section) Acute Rehab PT Goals Patient Stated Goal: to be able to get home and feel better PT Goal Formulation:  With patient Time For Goal Achievement: 04/06/19 Potential to Achieve Goals: Good Progress towards PT goals: Progressing toward goals    Frequency    Min 3X/week      PT Plan Current plan remains appropriate    Co-evaluation              AM-PAC PT "6 Clicks" Mobility   Outcome Measure  Help needed turning from your back to your side while in a flat bed without using bedrails?: A Little Help needed moving from lying on your back to sitting on the side of a flat bed without using bedrails?: A Little Help needed moving to and from a bed to a chair (including a wheelchair)?: A Little Help needed standing up from a chair using  your arms (e.g., wheelchair or bedside chair)?: A Little Help needed to walk in hospital room?: A Little Help needed climbing 3-5 steps with a railing? : A Little 6 Click Score: 18    End of Session Equipment Utilized During Treatment: Gait belt Activity Tolerance: Patient tolerated treatment well Patient left: in chair;with call bell/phone within reach Nurse Communication: Mobility status PT Visit Diagnosis: Unsteadiness on feet (R26.81);Other abnormalities of gait and mobility (R26.89);Muscle weakness (generalized) (M62.81)     Time: 7014-1030 PT Time Calculation (min) (ACUTE ONLY): 20 min  Charges:  $Gait Training: 8-22 mins                      Lanney Gins, PT, DPT Supplemental Physical Therapist 03/23/19 11:38 AM Pager: [phone] Office: [phone]

## 2019-03-23 NOTE — Progress Notes (Signed)
PROGRESS NOTE  [name] [name] CZY:[id number] DOB: 1961-03-05 DOA: 03/18/2019 PCP: Shirline Frees, MD  HPI/Recap of past 24 hours: [name] Char Pattersonis a 58 y.o.malewith medical history significant ofCOPD, PAF on amiodarone, not on oral anticoagulation, CAD, admitted for unstable angina at end of Feb, underwent LHC with PCI to 90% RCA lesion, unfortunately this ended up being complicated by type A aortic dissection.  Patient underwent repair of Type A dissection with single vessel RCA CABG (with SVG) and was discharged home. He was scheduled to see Dr. Prescott Gum in officefollow up 03/18/19.  For the past 3 days he has had progressively worsening central CP and SOB. EMS called.  Admitted for sepsis secondary to possible sternal abscess.  Cardiothoracic surgery consulted and following.    Hospital course complicated by postoperative pericarditis and pericardial effusion.  Started on colchicine on 03/19/2019.  On IV vancomycin and IV aztreonam for sternal abscess versus cellulitis.  Purulent discharge noted on 03/21/2019.  Plan for debridement and wound VAC placement on 03/22/2019 by CTS.  03/23/19: Patient seen and examined at his bedside.  No acute events overnight.  Wound VAC in place.  IV team consulted for PICC line placement.  His pain is well controlled on current pain management.  Denies dyspnea or palpitations.   Assessment/Plan: Principal Problem:   Abscess of sternal region Active Problems:   Essential hypertension   CAD (coronary artery disease)   History of CVA (cerebrovascular accident)   NASH (nonalcoholic steatohepatitis)   COPD (chronic obstructive pulmonary disease) (HCC)   Aortic dissection (HCC)   S/P ascending aortic aneurysm repair   PAF (paroxysmal atrial fibrillation) (HCC)  Sepsis secondary to upper sternal wound post debridement and wound VAC placement on 03/22/2019 by CTS presented with fever, tachycardia, tachypnea CTA chest aorta with and without  contrast showed rim-enhancing fluid collections below the sternotomy and rightward of the ascending aortic graft, history suggesting abscess per radiology report. Purulent discharge noted from upper sternal wound on 03/21/2019; had debridement and wound VAC placement on 03/22/2019 CTS Continue IV vancomycin and IV Aztreonam (penicillin allergy) Wound cultures pending, Gram stain unremarkable PICC line ordered for IV vancomycin and IV aztreonam infusions Continue close monitoring of vital signs and WBC Case manager consulted for arrangement of home health services with RN for wound care CTS following  Upper sternal wound, postsurgical, present admission Management as stated above Vital signs and labs reviewed and are stable.  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 40 mg daily Replace electrolytes as indicated Maintain O2 saturation greater than 92% Home O2 evaluation ordered and pending  Coronary artery disease status post CABG Status post one-vessel CABG Continue Lipitor 80 mg daily CTS following Continue cardiac medications  Paroxysmal A. fib Continue amiodarone for rhythm control Continue Coreg for rate control Not on oral anticoagulation  Iron deficiency anemia Hemoglobin 8.3 from 7.6 with MCV of 87  Continue iron supplement No sign  of overt bleeding  Chronic diastolic CHF Continue strict I's  and O's and daily weight Continue p.o. Lasix 40 mg daily Continue cardiac medications Monitor urine output Net I&O -752 cc since admission  GERD Stable Resume Protonix 80 mg twice daily  Uncontrolled hypertension Continue current medications Stop IV fluid normal saline on 03/19/2019  NASH -Chronic, currently stable  Improving physical debility PT assessed and had no recommendations for PT follow-up      DVT Prophylaxis: SCDs, defer to CTS to restart pharmacological VTE prophylaxis  Code Status: Full  Family Communication:  None at bedside  Disposition Plan: Home when CTS signs off.  Consultants: Cardiothoracic surgery, Dr. Prescott Gum  Procedures:  Upper sternal wound debridement on 03/22/2019 by CTS     Objective: Vitals:   03/22/19 2347 03/23/19 0329 03/23/19 0719 03/23/19 0844  BP: 127/70 (!) 136/55  (!) 92/56  Pulse:    86  Resp: 16 18  16   Temp: 98.3 F (36.8 C) 98.4 F (36.9 C)  98 F (36.7 C)  TempSrc: Oral Oral  Oral  SpO2:   99% 95%  Weight:  105.4 kg    Height:        Intake/Output Summary (Last 24 hours) at 03/23/2019 0905 Last data filed at 03/23/2019 0606 Gross per 24 hour  Intake 3360.74 ml  Output 1902 ml  Net 1458.74 ml   Filed Weights   03/21/19 0300 03/22/19 0441 03/23/19 0329  Weight: 110 kg 106.7 kg 105.4 kg    Exam:   General: 58 y.o. year-old male well-developed well-nourished no acute distress.  Alert and oriented x3.    Cardiovascular: Regular rate and rhythm with no rubs or gallops.  No JVD or thyromegaly.  Mid sternal scar with wound VAC in place  Respiratory: Clear to auscultation with no wheezes or rales.  Poor inspiratory effort.  Abdomen: Soft obese nontender nondistended with normal bowel sounds x4 quadrants.  Musculoskeletal: 1+ pitting edema in lower extremities bilaterally.    Psychiatry: Mood is appropriate for condition and  setting   Data Reviewed: CBC: Recent Labs  Lab 03/18/19 0315  03/19/19 0113 03/20/19 0225 03/21/19 0211 03/22/19 0215 03/23/19 0312  WBC 11.9*  --  10.0 9.1 10.1 7.7 6.9  NEUTROABS 8.8*  --   --   --   --   --   --   HGB 8.1*   < > 7.8* 7.6* 8.2* 7.6* 8.3*  HCT 27.0*   < > 25.0* 25.4* 26.8* 24.5* 27.0*  MCV 91.2  --  89.0 89.1 88.4 87.8 87.9  PLT 455*  --  366 388 429* 398 423*   < > = values in this interval not displayed.   Basic Metabolic Panel: Recent Labs  Lab 03/19/19 0113 03/20/19 0225 03/21/19 0211 03/22/19 0215 03/23/19 0312  NA 137 134* 135 134* 137  K 3.9 3.6 4.0 4.0 4.4  CL 104 102 101 101 102  CO2 21* 25 25 27 28   GLUCOSE 110* 111* 110* 130* 128*  BUN 10 13 11 13 11   CREATININE 0.66 0.81 0.76 0.79 0.79  CALCIUM 9.2 8.9 9.4 9.3 9.4   GFR: Estimated Creatinine Clearance: 118.4 mL/min (by C-G formula based on SCr of 0.79 mg/dL). Liver Function Tests: Recent Labs  Lab 03/18/19 0315  AST 19  ALT 29  ALKPHOS 106  BILITOT 0.5  PROT 6.8  ALBUMIN 2.6*   No results for input(s): LIPASE, AMYLASE in the last 168 hours. No results for input(s): AMMONIA in the last 168 hours. Coagulation Profile: Recent Labs  Lab 03/18/19  0853 03/21/19 0842  INR 1.1 1.2   Cardiac Enzymes: No results for input(s): CKTOTAL, CKMB, CKMBINDEX, TROPONINI in the last 168 hours. BNP (last 3 results) No results for input(s): PROBNP in the last 8760 hours. HbA1C: No results for input(s): HGBA1C in the last 72 hours. CBG: No results for input(s): GLUCAP in the last 168 hours. Lipid Profile: No results for input(s): CHOL, HDL, LDLCALC, TRIG, CHOLHDL, LDLDIRECT in the last 72 hours. Thyroid Function Tests: No results for input(s): TSH, T4TOTAL, FREET4, T3FREE, THYROIDAB in the last 72 hours. Anemia Panel: No results for input(s): VITAMINB12, FOLATE, FERRITIN, TIBC, IRON, RETICCTPCT in the last 72 hours. Urine analysis:    Component Value Date/Time   COLORURINE YELLOW  03/21/2019 0900   APPEARANCEUR CLEAR 03/21/2019 0900   LABSPEC 1.011 03/21/2019 0900   PHURINE 5.0 03/21/2019 0900   GLUCOSEU NEGATIVE 03/21/2019 0900   HGBUR NEGATIVE 03/21/2019 0900   BILIRUBINUR NEGATIVE 03/21/2019 0900   KETONESUR NEGATIVE 03/21/2019 0900   PROTEINUR NEGATIVE 03/21/2019 0900   UROBILINOGEN 0.2 08/03/2015 0608   NITRITE NEGATIVE 03/21/2019 0900   LEUKOCYTESUR NEGATIVE 03/21/2019 0900   Sepsis Labs: @LABRCNTIP (procalcitonin:4,lacticidven:4)  ) Recent Results (from the past 240 hour(s))  Blood Culture (routine x 2)     Status: None (Preliminary result)   Collection Time: 03/18/19  3:00 AM  Result Value Ref Range Status   Specimen Description BLOOD LEFT ARM  Final   Special Requests   Final    BOTTLES DRAWN AEROBIC AND ANAEROBIC Blood Culture adequate volume   Culture   Final    NO GROWTH 4 DAYS Performed at [facility], [place] [address], [place], [place] [address]    Report Status PENDING  Incomplete  Blood Culture (routine x 2)     Status: None (Preliminary result)   Collection Time: 03/18/19  3:30 AM  Result Value Ref Range Status   Specimen Description BLOOD RIGHT HAND  Final   Special Requests   Final    BOTTLES DRAWN AEROBIC ONLY Blood Culture adequate volume   Culture   Final    NO GROWTH 4 DAYS Performed at [facility], [place] [address], [place], [place] [address]    Report Status PENDING  Incomplete  Urine culture     Status: None   Collection Time: 03/18/19  4:51 AM  Result Value Ref Range Status   Specimen Description URINE, CLEAN CATCH  Final   Special Requests Normal  Final   Culture   Final    NO GROWTH Performed at [facility], [place] [address], [place], [place] [address]    Report Status 03/19/2019 FINAL  Final  Respiratory Panel by PCR     Status: None   Collection Time: 03/18/19  5:54 AM  Result Value Ref Range Status   Adenovirus NOT DETECTED NOT DETECTED Final   Coronavirus 229E NOT DETECTED NOT DETECTED Final     Comment: (NOTE) The Coronavirus on the Respiratory Panel, DOES NOT test for the novel  Coronavirus (2019 nCoV)    Coronavirus HKU1 NOT DETECTED NOT DETECTED Final   Coronavirus NL63 NOT DETECTED NOT DETECTED Final   Coronavirus OC43 NOT DETECTED NOT DETECTED Final   Metapneumovirus NOT DETECTED NOT DETECTED Final   Rhinovirus / Enterovirus NOT DETECTED NOT DETECTED Final   Influenza A NOT DETECTED NOT DETECTED Final   Influenza B NOT DETECTED NOT DETECTED Final   Parainfluenza Virus 1 NOT DETECTED NOT DETECTED Final   Parainfluenza Virus 2  NOT DETECTED NOT DETECTED Final   Parainfluenza Virus 3 NOT DETECTED NOT DETECTED Final   Parainfluenza Virus 4 NOT DETECTED NOT DETECTED Final   Respiratory Syncytial Virus NOT DETECTED NOT DETECTED Final   Bordetella pertussis NOT DETECTED NOT DETECTED Final   Chlamydophila pneumoniae NOT DETECTED NOT DETECTED Final   Mycoplasma pneumoniae NOT DETECTED NOT DETECTED Final    Comment: Performed at [facility], [place] [address] [place], [place] [address]  MRSA PCR Screening     Status: None   Collection Time: 03/18/19  9:23 AM  Result Value Ref Range Status   MRSA by PCR NEGATIVE NEGATIVE Final    Comment:        The GeneXpert MRSA Assay (FDA approved for NASAL specimens only), is one component of a comprehensive MRSA colonization surveillance program. It is not intended to diagnose MRSA infection nor to guide or monitor treatment for MRSA infections. Performed at [facility], [place] [address], [place], [place] [address]   Aerobic Culture (superficial specimen)     Status: None   Collection Time: 03/21/19  8:29 AM  Result Value Ref Range Status   Specimen Description CHEST  Final   Special Requests Normal  Final   Gram Stain   Final    FEW WBC PRESENT, PREDOMINANTLY PMN NO ORGANISMS SEEN    Culture   Final    NO GROWTH 2 DAYS Performed at [facility], [place] [address] [place], [place] [address]    Report Status  03/23/2019 FINAL  Final  Aerobic Culture (superficial specimen)     Status: None   Collection Time: 03/22/19  9:00 AM  Result Value Ref Range Status   Specimen Description WOUND CHEST  Final   Special Requests PATIENT ON FOLLOWING VANC  Final   Gram Stain TEST WILL BE CREDITED SEE [id number]  Final   Culture   Final    TEST WILL BE CREDITED SEE [id number] Performed at [facility], [place] [address], [place], [place] [address]    Report Status 03/22/2019 FINAL  Final  Aerobic/Anaerobic Culture (surgical/deep wound)     Status: None (Preliminary result)   Collection Time: 03/22/19  9:00 AM  Result Value Ref Range Status   Specimen Description WOUND CHEST  Final   Special Requests PATIENT ON FOLLOWING VANC  Final   Gram Stain   Final    ABUNDANT WBC PRESENT, PREDOMINANTLY PMN NO ORGANISMS SEEN    Culture   Final    NO GROWTH < 24 HOURS Performed at [facility], [place] [address] [place],  [address]    Report Status PENDING  Incomplete      Studies: [name] Ekg Site Rite  Result Date: 03/23/2019 If [facility] image not attached, placement could not be confirmed due to current cardiac rhythm.   Scheduled Meds:  sodium chloride   Intravenous Once   allopurinol  100 mg Oral Daily   amiodarone  200 mg Oral BID   atorvastatin  80 mg Oral q1800   carvedilol  25 mg Oral BID WC   colchicine  0.6 mg Oral Daily   ferrous [id number] C-folic acid  1 capsule Oral TID PC   furosemide  40 mg Oral Daily   ipratropium-albuterol  3 mL Nebulization BID   losartan  100 mg Oral Daily   pantoprazole  80 mg Oral BID   traZODone  100 mg Oral QHS    Continuous Infusions:  aztreonam 2  g (03/23/19 0606)   lactated ringers 10 mL/hr at 03/22/19 0724   vancomycin 1,750 mg (03/23/19 0341)     LOS: 5 days     Kayleen Memos, MD [facility] Pager [phone]  If 7PM-7AM, please contact night-coverage [email or web address] Password [email or web address] 03/23/2019, 9:05 AM

## 2019-03-23 NOTE — Consult Note (Signed)
   [facility] CM Inpatient Consult   03/23/2019  [name] [name] April 10, 1961 [id number]   Patient was screened for potential [facility] Care Management for community services with his [employer] plan. He has 21% unplanned readmission risk and hospitalization.  Patient presented with progressively worsening chest pain and shortness of breath, was admitted for sepsis secondary to abscess of sternal region (had aortic dissection in Feb). Patient underwent sternal wound debridement with application of wound vac this admission.   Chart review shows MD history and physical dated 03/18/2019 that patient is a 58 y.o.malewith medical history significant ofCOPD, PAF-on amiodarone, CAD, admitted for unstable angina at end of Feb, underwent LHC with PCI to 90% RCA lesion, unfortunately this ended up being complicated by type A aortic dissection.  Spoke with Transition of Care RNCM, Sam to assess for [facility] Care Management needs.  She states that patient will possibly be discharging home with home health services (RN and aide- [facility] chosen) but not anytime soon due to plan for wound vac change again this Friday, then Monday-Wednesday-Friday next week. Patient will have home health RN and aide to assist with needs. Patient lives at home with wife and has no barrier for medications, transportation or pharmacy. There are no further identified [facility] Care Management needs at this point. Transition of care CM aware to place a [facility] Care Management consult for community follow-up as appropriate, if patient's post hospital needs change. Of note, [facility] Care Management services does not replace or interfere with any services that are arranged by transition of care CM or social work.  Patient's primary care provider is Dr. Shirline Frees with [place] [facility] [place].   Patient would benefit from [facility] calls to follow-up recovery after discharge and referral has been made.    For questions or referral,  please contact:  Tamme Mozingo A. Astou Lada, BSN, RN-BC [facility] Liaison Cell: [phone]

## 2019-03-23 NOTE — Progress Notes (Addendum)
1 Day Post-Op Procedure(s) (LRB): STERNAL WOUND DEBRIDEMENT (N/A) APPLICATION OF WOUND VAC (N/A) Subjective: Awake and alert.  Up in the bedside chair.  Had some peri-incisional pain last night but is more comfortable this morning.  Objective: Vital signs in last 24 hours: Temp:  [97.9 F (36.6 C)-98.7 F (37.1 C)] 98 F (36.7 C) (03/25 0844) Pulse Rate:  [73-92] 86 (03/25 0844) Cardiac Rhythm: Normal sinus rhythm (03/25 0844) Resp:  [11-23] 16 (03/25 0844) BP: (92-143)/(55-82) 92/56 (03/25 0844) SpO2:  [94 %-100 %] 95 % (03/25 0844) Weight:  [105.4 kg] 105.4 kg (03/25 0329)     Intake/Output from previous day: 03/24 0701 - 03/25 0700 In: 3360.7 [P.O.:960; I.V.:900; Blood:362.5; IV Piggyback:1138.2] Out: 1902 [Urine:1300; Drains:550; [place]; Blood:50] Intake/Output this shift: No intake/output data recorded.  General appearance: alert, cooperative and no distress Heart: regular rate and rhythm Lungs: Breath sounds are clear Extremities: Bilateral 2+ lower extremity edema Wound: Sternal wound VAC sponge is appropriately compressed with no apparent leaks.  There is minimal drainage.  Lab Results: Recent Labs    03/22/19 0215 03/23/19 0312  WBC 7.7 6.9  HGB 7.6* 8.3*  HCT 24.5* 27.0*  PLT 398 423*   BMET:  Recent Labs    03/22/19 0215 03/23/19 0312  NA 134* 137  K 4.0 4.4  CL 101 102  CO2 27 28  GLUCOSE 130* 128*  BUN 13 11  CREATININE 0.79 0.79  CALCIUM 9.3 9.4    PT/INR:  Recent Labs    03/21/19 0842  LABPROT 15.2  INR 1.2   ABG    Component Value Date/Time   PHART 7.451 (H) 03/18/2019 0329   HCO3 24.1 03/18/2019 0329   TCO2 25 03/18/2019 0329   ACIDBASEDEF 3.0 (H) 02/24/2019 0155   O2SAT 91.0 03/18/2019 0329   CBG (last 3)  No results for input(s): GLUCAP in the last 72 hours.  Assessment/Plan: S/P Procedure(s) (LRB): STERNAL WOUND DEBRIDEMENT (N/A) APPLICATION OF WOUND VAC (N/A)  -Postop day 1 incision and drainage of superficial  sternal wound.  The wound VAC is functioning appropriately.  Plan to leave in place until Friday.  Wound cultures are pending.  Gram stain showed multiple white cells but no organisms were seen.  He is afebrile and leukocytosis has resolved continue with broad-spectrum antibiotic coverage for now and will adjust coverage when cultures are available.  -Suspect he will eventually need a PICC line for long-term antibiotics.  His current IV in his left arm is functioning satisfactorily.  Would hold off on PICC placement for another 24 hours if possible.   LOS: 5 days    Antony Odea, [place] [phone] 03/23/2019  Patient examined and I agree with above assessment and plan.  History of emergency repair of type I aortic dissection with combined CABG to RCA  Postoperative mild-moderate pericardial effusion, asymptomatic and stable.  Normal biventricular function postoperative CTA shows ascending aortic graft repair intact without leak or pseudoaneurysm.  Will need a follow-up echo prior to discharge  Superficial upper sternal wound infection treated with IV antibiotics[vancomycin and Azactam pending culture results] and superficial sternal wound debridement irrigation and wound VAC therapy.  Blood cultures are negative.  Plan PICC line tomorrow since patient is afebrile with normal white count for long-term IV antibiotics.  Wound care-plan wound VAC change Friday, March 27 at bedside then Monday Wednesday Friday next week

## 2019-03-24 DIAGNOSIS — L02213 Cutaneous abscess of chest wall: Secondary | ICD-10-CM

## 2019-03-24 DIAGNOSIS — I5032 Chronic diastolic (congestive) heart failure: Secondary | ICD-10-CM

## 2019-03-24 LAB — CBC
HCT: 27.9 % — ABNORMAL LOW (ref 39.0–52.0)
Hemoglobin: 8.3 g/dL — ABNORMAL LOW (ref 13.0–17.0)
MCH: 26.4 pg (ref 26.0–34.0)
MCHC: 29.7 g/dL — ABNORMAL LOW (ref 30.0–36.0)
MCV: 88.9 fL (ref 80.0–100.0)
Platelets: 425 10*3/uL — ABNORMAL HIGH (ref 150–400)
RBC: 3.14 MIL/uL — ABNORMAL LOW (ref 4.22–5.81)
RDW: 14.6 % (ref 11.5–15.5)
WBC: 8.1 10*3/uL (ref 4.0–10.5)
nRBC: 0 % (ref 0.0–0.2)

## 2019-03-24 LAB — VANCOMYCIN, TROUGH: Vancomycin Tr: 13 ug/mL — ABNORMAL LOW (ref 15–20)

## 2019-03-24 LAB — PREPARE RBC (CROSSMATCH)

## 2019-03-24 LAB — VANCOMYCIN, PEAK: Vancomycin Pk: 34 ug/mL (ref 30–40)

## 2019-03-24 MED ORDER — HYDROMORPHONE HCL 1 MG/ML IJ SOLN
1.0000 mg | INTRAMUSCULAR | Status: DC | PRN
  Administered 2019-03-24 – 2019-03-27 (×17): 1 mg via INTRAVENOUS
  Filled 2019-03-24 (×17): qty 1

## 2019-03-24 MED ORDER — TORSEMIDE 20 MG PO TABS
20.0000 mg | ORAL_TABLET | Freq: Two times a day (BID) | ORAL | Status: DC
  Administered 2019-03-24 – 2019-03-25 (×2): 20 mg via ORAL
  Filled 2019-03-24 (×2): qty 1

## 2019-03-24 MED ORDER — SODIUM CHLORIDE 0.9% FLUSH
10.0000 mL | Freq: Two times a day (BID) | INTRAVENOUS | Status: DC
  Administered 2019-03-24 – 2019-04-06 (×21): 10 mL

## 2019-03-24 MED ORDER — SODIUM CHLORIDE 0.9% FLUSH: 10.0000 mL | INTRAVENOUS | Status: DC | PRN

## 2019-03-24 MED ORDER — FUROSEMIDE 10 MG/ML IJ SOLN
INTRAMUSCULAR | Status: AC
  Administered 2019-03-24: 40 mg
  Filled 2019-03-24: qty 8

## 2019-03-24 MED ORDER — VANCOMYCIN HCL 10 G IV SOLR
1500.0000 mg | Freq: Two times a day (BID) | INTRAVENOUS | Status: DC
  Administered 2019-03-24 – 2019-03-30 (×12): 1500 mg via INTRAVENOUS
  Filled 2019-03-24 (×17): qty 1500

## 2019-03-24 NOTE — Care Management Important Message (Signed)
Important Message  Patient Details  Name: [name] [name] MRN: [id number] Date of Birth: 1961/11/19   Medicare Important Message Given:  Yes    Orbie Pyo 03/24/2019, 2:29 PM

## 2019-03-24 NOTE — Progress Notes (Signed)
PROGRESS NOTE  [name] [name] PRF:[id number] DOB: 1961-03-29 DOA: 03/18/2019 PCP: [name] Frees, MD  HPI/Recap: [name] Sibal Pattersonis a 58 year old male with past medical history significant for COPD, PAF on amiodarone, not on oral anticoagulation, CAD.  Patient was admitted for unstable angina at end of Feb, 2020 and underwent LHC with PCI to 90% RCA lesion, unfortunately this ended up being complicated by type A aortic dissection.  Patient underwent repair of Type A dissection with single vessel RCA CABG (with SVG) and was discharged home. He was scheduled to see Dr. Prescott [name] in officefollow up 03/18/19.  3 days prior to presentation, patient developed progressively worsening central CP and SOB. EMS was called, and patient was admitted for sepsis secondary to possible sternal abscess.  Cardiothoracic surgery was consulted and she has undergone I&D of the postsurgical wound.  Wound culture has not grown any organisms to date.  Wound VAC is currently placed.  Cardiothoracic team is managing.  PICC line has also been placed.  Is currently on IV vancomycin and aztreonam.  Hospital course has been complicated by postoperative pericarditis and pericardial effusion.  Patient was started on colchicine on 03/19/2019.    03/24/19: Patient seen alongside patient's nurse.  No new complaints.  No fever.  Pain is reasonably controlled.  No reported shortness of breath.  Cardiothoracic surgery input is highly appreciated.  He also reported having chronic pain problems.   Assessment/Plan: Principal Problem:   Abscess of sternal region Active Problems:   Essential hypertension   CAD (coronary artery disease)   History of CVA (cerebrovascular accident)   NASH (nonalcoholic steatohepatitis)   COPD (chronic obstructive pulmonary disease) (HCC)   Aortic dissection (HCC)   S/P ascending aortic aneurysm repair   PAF (paroxysmal atrial fibrillation) (HCC)  Sepsis secondary to upper sternal wound post  debridement and wound VAC placement on 03/22/2019 by CTS presented with fever, tachycardia, tachypnea CTA chest aorta with and without contrast showed rim-enhancing fluid collections below the sternotomy and rightward of the ascending aortic graft, history suggesting abscess per radiology report. Purulent discharge noted from upper sternal wound on 03/21/2019; had debridement and wound VAC placement on 03/22/2019 CTS Continue IV vancomycin and IV Aztreonam (penicillin allergy) Wound cultures have not grown any organisms to date.   PICC line is placed as patient is currently on IV vancomycin and IV aztreonam.  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  Resolving acute hypoxic respiratory failure suspect secondary to mild pulmonary edema -Chest x-ray done on 03/21/2019 revealed no signs of lobular infiltrates with cardiomegaly and mild increase in pulmonary vascularity indicative of mild pulmonary edema -We will change Lasix to torsemide (pharmacokinetic advantage, considering significant edema)  -Low threshold to add IV albumin if edema persists, but will be careful for now considering pericardial effusion. -Continue to monitor electrolytes and renal function.  Coronary artery disease status post CABG -Status post one-vessel CABG -Recent history of PCI to RCA. -I do not visualize any antiplatelet in patients medication list, will consult cardiology to advise.  Paroxysmal A. fib Continue amiodarone for  rhythm control Continue Coreg for  rate control Not on oral anticoagulation  Iron deficiency anemia Hemoglobin 8.3 from 7.6 with MCV of 87  Continue iron supplement No sign of overt bleeding  Chronic diastolic CHF Continue strict I's and O's and daily weight Change Lasix to torsemide. Monitor urine output  GERD Stable  Uncontrolled hypertension 03/24/2019: Blood pressures currently optimized.  NASH -Chronic, currently stable  Improving physical debility PT assessed and had no recommendations for PT follow-up   DVT Prophylaxis: SCDs, defer to CTS to restart pharmacological VTE prophylaxis  Code Status: Full  Family Communication:  None at bedside  Disposition Plan: Home when CTS signs off.  Consultants: Cardiothoracic surgery, Dr. Prescott [name]  Procedures:  Upper sternal wound debridement on 03/22/2019 by CTS     Objective: Vitals:   03/24/19 0322 03/24/19 0644 03/24/19 0716 03/24/19 0751  BP: 136/75     Pulse: 73     Resp: 13     Temp: 98.3 F (36.8 C)   98.4 F (36.9 C)  TempSrc: Oral   Oral  SpO2: 95%  96% 96%  Weight:  106.7 kg    Height:        Intake/Output Summary (Last 24 hours) at 03/24/2019 1039 Last data filed at 03/24/2019 5809 Gross per 24 hour  Intake 1898.37 ml  Output 3450 ml  Net -1551.63 ml   Filed Weights   03/22/19 0441 03/23/19 0329 03/24/19 0644  Weight: 106.7 kg 105.4 kg 106.7 kg    Exam:  . General: 58 y.o. year-old male well-developed well-nourished no acute distress.  Alert and oriented x3.  Obese . Cardiovascular: Regular rate and rhythm with no rubs or gallops.  No JVD or thyromegaly.  Mid sternal scar with wound VAC in place . Respiratory: Clear to auscultation with no wheezes or rales.  Poor inspiratory effort. . Abdomen: Soft obese nontender nondistended with normal bowel sounds x4 quadrants. . Musculoskeletal: 2-2+ pitting edema in lower extremities bilaterally.     Data Reviewed: CBC: Recent Labs  Lab  03/18/19 0315  03/20/19 0225 03/21/19 0211 03/22/19 0215 03/23/19 0312 03/24/19 0211  WBC 11.9*   < > 9.1 10.1 7.7 6.9 8.1  NEUTROABS 8.8*  --   --   --   --   --   --   HGB 8.1*   < > 7.6* 8.2* 7.6* 8.3* 8.3*  HCT 27.0*   < > 25.4* 26.8* 24.5* 27.0* 27.9*  MCV 91.2   < > 89.1 88.4 87.8 87.9 88.9  PLT 455*   < > 388 429* 398 423* 425*   < > = values in this interval not displayed.   Basic Metabolic Panel: Recent Labs  Lab 03/19/19 0113 03/20/19 0225 03/21/19 0211 03/22/19 0215 03/23/19 0312  NA 137 134* 135 134* 137  K 3.9 3.6 4.0 4.0 4.4  CL 104 102 101 101 102  CO2 21* 25 25 27 28   GLUCOSE 110* 111* 110* 130* 128*  BUN 10 13 11 13 11   CREATININE 0.66 0.81 0.76 0.79 0.79  CALCIUM 9.2 8.9 9.4 9.3 9.4   GFR: Estimated Creatinine Clearance: 119.2 mL/min (by C-G formula based on SCr of 0.79 mg/dL). Liver Function Tests: Recent Labs  Lab 03/18/19 0315  AST 19  ALT 29  ALKPHOS 106  BILITOT 0.5  PROT 6.8  ALBUMIN 2.6*   No results for input(s): LIPASE, AMYLASE in the last 168 hours. No results for input(s): AMMONIA in the last 168 hours. Coagulation Profile: Recent Labs  Lab 03/18/19 0853 03/21/19  0842  INR 1.1 1.2   Cardiac Enzymes: No results for input(s): CKTOTAL, CKMB, CKMBINDEX, TROPONINI in the last 168 hours. BNP (last 3 results) No results for input(s): PROBNP in the last 8760 hours. HbA1C: No results for input(s): HGBA1C in the last 72 hours. CBG: No results for input(s): GLUCAP in the last 168 hours. Lipid Profile: No results for input(s): CHOL, HDL, LDLCALC, TRIG, CHOLHDL, LDLDIRECT in the last 72 hours. Thyroid Function Tests: No results for input(s): TSH, T4TOTAL, FREET4, T3FREE, THYROIDAB in the last 72 hours. Anemia Panel: No results for input(s): VITAMINB12, FOLATE, FERRITIN, TIBC, IRON, RETICCTPCT in the last 72 hours. Urine analysis:    Component Value Date/Time   COLORURINE YELLOW 03/21/2019 0900   APPEARANCEUR CLEAR 03/21/2019 0900    LABSPEC 1.011 03/21/2019 0900   PHURINE 5.0 03/21/2019 0900   GLUCOSEU NEGATIVE 03/21/2019 0900   HGBUR NEGATIVE 03/21/2019 0900   BILIRUBINUR NEGATIVE 03/21/2019 0900   KETONESUR NEGATIVE 03/21/2019 0900   PROTEINUR NEGATIVE 03/21/2019 0900   UROBILINOGEN 0.2 08/03/2015 0608   NITRITE NEGATIVE 03/21/2019 0900   LEUKOCYTESUR NEGATIVE 03/21/2019 0900   Sepsis Labs: @LABRCNTIP (procalcitonin:4,lacticidven:4)  ) Recent Results (from the past 240 hour(s))  Blood Culture (routine x 2)     Status: None   Collection Time: 03/18/19  3:00 AM  Result Value Ref Range Status   Specimen Description BLOOD LEFT ARM  Final   Special Requests   Final    BOTTLES DRAWN AEROBIC AND ANAEROBIC Blood Culture adequate volume   Culture   Final    NO GROWTH 5 DAYS Performed at [facility], [address], [place], [place] [address]    Report Status 03/23/2019 FINAL  Final  Blood Culture (routine x 2)     Status: None   Collection Time: 03/18/19  3:30 AM  Result Value Ref Range Status   Specimen Description BLOOD RIGHT HAND  Final   Special Requests   Final    BOTTLES DRAWN AEROBIC ONLY Blood Culture adequate volume   Culture   Final    NO GROWTH 5 DAYS Performed at [facility], [place] [address], [place], [place] [address]    Report Status 03/23/2019 FINAL  Final  Urine culture     Status: None   Collection Time: 03/18/19  4:51 AM  Result Value Ref Range Status   Specimen Description URINE, CLEAN CATCH  Final   Special Requests Normal  Final   Culture   Final    NO GROWTH Performed at [facility], [place] [address] [place],  [address]    Report Status 03/19/2019 FINAL  Final  Respiratory Panel by PCR     Status: None   Collection Time: 03/18/19  5:54 AM  Result Value Ref Range Status   Adenovirus NOT DETECTED NOT DETECTED Final   Coronavirus 229E NOT DETECTED NOT DETECTED Final    Comment: (NOTE) The Coronavirus on the Respiratory Panel, DOES NOT test for the  novel  Coronavirus (2019 nCoV)    Coronavirus HKU1 NOT DETECTED NOT DETECTED Final   Coronavirus NL63 NOT DETECTED NOT DETECTED Final   Coronavirus OC43 NOT DETECTED NOT DETECTED Final   Metapneumovirus NOT DETECTED NOT DETECTED Final   Rhinovirus / Enterovirus NOT DETECTED NOT DETECTED Final   Influenza A NOT DETECTED NOT DETECTED Final   Influenza B NOT DETECTED NOT DETECTED Final   Parainfluenza Virus 1 NOT DETECTED NOT DETECTED Final   Parainfluenza Virus 2 NOT DETECTED NOT DETECTED  Final   Parainfluenza Virus 3 NOT DETECTED NOT DETECTED Final   Parainfluenza Virus 4 NOT DETECTED NOT DETECTED Final   Respiratory Syncytial Virus NOT DETECTED NOT DETECTED Final   Bordetella pertussis NOT DETECTED NOT DETECTED Final   Chlamydophila pneumoniae NOT DETECTED NOT DETECTED Final   Mycoplasma pneumoniae NOT DETECTED NOT DETECTED Final    Comment: Performed at [facility], [place] [address], [place], [place] [address]  MRSA PCR Screening     Status: None   Collection Time: 03/18/19  9:23 AM  Result Value Ref Range Status   MRSA by PCR NEGATIVE NEGATIVE Final    Comment:        The GeneXpert MRSA Assay (FDA approved for NASAL specimens only), is one component of a comprehensive MRSA colonization surveillance program. It is not intended to diagnose MRSA infection nor to guide or monitor treatment for MRSA infections. Performed at [facility], [place] [address], [place], [place] [address]   Aerobic Culture (superficial specimen)     Status: None   Collection Time: 03/21/19  8:29 AM  Result Value Ref Range Status   Specimen Description CHEST  Final   Special Requests Normal  Final   Gram Stain   Final    FEW WBC PRESENT, PREDOMINANTLY PMN NO ORGANISMS SEEN    Culture   Final    NO GROWTH 2 DAYS Performed at [facility], [place] [address], [place], [place] [address]    Report Status 03/23/2019 FINAL  Final  Aerobic Culture (superficial specimen)     Status: None    Collection Time: 03/22/19  9:00 AM  Result Value Ref Range Status   Specimen Description WOUND CHEST  Final   Special Requests PATIENT ON FOLLOWING VANC  Final   Gram Stain TEST WILL BE CREDITED SEE [id number]  Final   Culture   Final    TEST WILL BE CREDITED SEE [id number] Performed at [facility], [place] [address] [place], [place] [address]    Report Status 03/22/2019 FINAL  Final  Acid Fast Smear (AFB)     Status: None   Collection Time: 03/22/19  9:00 AM  Result Value Ref Range Status   AFB Specimen Processing Concentration  Final   Acid Fast Smear Negative  Final    Comment: (NOTE) Performed At: [facility] [place], [place] [id number] Rush Farmer MD OI:[id number]    Source (AFB) WOUND  Final    Comment: CHEST Performed at [facility], [place] [address], [place], [place] [address]   Aerobic/Anaerobic Culture (surgical/deep wound)     Status: None (Preliminary result)   Collection Time: 03/22/19  9:00 AM  Result Value Ref Range Status   Specimen Description WOUND CHEST  Final   Special Requests PATIENT ON FOLLOWING VANC  Final   Gram Stain   Final    ABUNDANT WBC PRESENT, PREDOMINANTLY PMN NO ORGANISMS SEEN    Culture   Final    NO GROWTH 2 DAYS NO ANAEROBES ISOLATED; CULTURE IN PROGRESS FOR 5 DAYS Performed at [facility], [place] [address], [place], [place] [address]    Report Status PENDING  Incomplete      Studies: No results found.  Scheduled Meds: . sodium chloride   Intravenous Once  . allopurinol  100 mg Oral Daily  . amiodarone  200 mg Oral BID  . atorvastatin  80 mg Oral q1800  . carvedilol  25 mg Oral  BID WC  . colchicine  0.6 mg Oral Daily  . ferrous [id number] C-folic acid  1 capsule Oral TID PC  . furosemide  40 mg Oral Daily  . ipratropium-albuterol  3 mL Nebulization BID  . losartan  100 mg Oral Daily  . pantoprazole  80 mg Oral BID  . sodium chloride flush  10-40 mL Intracatheter Q12H  . traZODone   100 mg Oral QHS    Continuous Infusions: . aztreonam 2 g (03/24/19 0610)  . lactated ringers 10 mL/hr at 03/22/19 0724  . vancomycin 1,750 mg (03/24/19 0336)     LOS: 6 days     Bonnell Public, MD [facility] Pager [phone]  If 7PM-7AM, please contact night-coverage [email or web address] Password [facility] 03/24/2019, 10:39 AM

## 2019-03-24 NOTE — Evaluation (Signed)
Occupational Therapy Evaluation Patient Details Name: [name] [name] MRN: [id number] DOB: 07-Jul-1961 Today's Date: 03/24/2019    History of Present Illness [name] [name] is a 58 y.o. male with medical history significant of COPD, PAF on amiodarone, not on oral anticoagulation, CAD, admitted for unstable angina at end of Feb, underwent LHC with PCI to 90% RCA lesion, unfortunately this ended up being complicated by type A aortic dissection. Went home, then presented with 3 day history of SOB.   Clinical Impression   PTA Pt was recovering from his prior sx, and was performing prescribed HEP by previous therapist with great improvement in R hand. 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 External rotation is impacted in LUE. OT will follow patient acutely and continue to educated on compensatory strategies, ROM and strength in R hand, and adhering to sternal precautions for ADL.     Follow Up Recommendations  Outpatient OT;Supervision - Intermittent    Equipment Recommendations  None recommended by OT    Recommendations for Other Services       Precautions / Restrictions Precautions Precautions: Sternal Precaution Comments: wound vac Restrictions Weight Bearing Restrictions: No Other Position/Activity Restrictions: sternal precautions      Mobility Bed Mobility               General bed mobility comments: in recliner upon arrival  Transfers Overall transfer level: Needs assistance Equipment used: None Transfers: Sit to/from Stand Sit to Stand: Supervision         General transfer comment: good power up technique    Balance Overall balance assessment: Needs assistance Sitting-balance support: Feet supported;No upper extremity supported Sitting balance-Leahy Scale: Good     Standing balance support: Bilateral upper extremity supported;During  functional activity Standing balance-Leahy Scale: Fair                             ADL either performed or assessed with clinical judgement   ADL Overall ADL's : Needs assistance/impaired Eating/Feeding: Set up;Sitting Eating/Feeding Details (indicate cue type and reason): built up handles provided as R hand fatigues Grooming: Minimal assistance;Standing;Wash/dry hands;Wash/dry face;Oral care;Brushing hair Grooming Details (indicate cue type and reason): educated on built up handles for grooming tasks as well "Oh yeah, the lady I saw last time I was here gave me some of those" Upper Body Bathing: Set up   Lower Body Bathing: Supervison/ safety   Upper Body Dressing : Set up   Lower Body Dressing: Min guard;Sit to/from stand   Toilet Transfer: Min guard;Ambulation   Toileting- Clothing Manipulation and Hygiene: Min guard;Sit to/from stand       Functional mobility during ADLs: Supervision/safety General ADL Comments: reviewed compensatory techniques for ADLs, educated on A/E (built up grip handles)     Vision Patient Visual Report: No change from baseline       Perception     Praxis      Pertinent Vitals/Pain Pain Assessment: 0-10 Pain Score: 7  Pain Location: parasternal Pain Descriptors / Indicators: Constant;Operative site guarding Pain Intervention(s): Monitored during session;Repositioned     Hand Dominance Right   Extremity/Trunk Assessment Upper Extremity Assessment Upper Extremity Assessment: RUE deficits/detail;LUE deficits/detail RUE Deficits / Details: weak grasp 3/5, pt provided with built up tubing. Pt reports sharp burning pain occasionally RUE Sensation: WNL RUE Coordination: decreased fine motor LUE Deficits /  Details: Pt is able to perform FF, Abduction, horizontal AB/Adduction, elbow wrist and hand ROM - strange movement patterns. After consultation with clinical specialist (who also is familiar with patient from previous admission)  this is a chronic problem that he has been dealing with for some time that is related to external rotation.  LUE Coordination: decreased gross motor   Lower Extremity Assessment Lower Extremity Assessment: Defer to PT evaluation   Cervical / Trunk Assessment Cervical / Trunk Assessment: Normal   Communication Communication Communication: No difficulties   Cognition Arousal/Alertness: Awake/alert Behavior During Therapy: WFL for tasks assessed/performed Overall Cognitive Status: No family/caregiver present to determine baseline cognitive functioning                                     General Comments       Exercises     Shoulder Instructions      Home Living Family/patient expects to be discharged to:: Private residence Living Arrangements: Spouse/significant other Available Help at Discharge: Family Type of Home: House Home Access: Stairs to enter Technical brewer of Steps: 1   Home Layout: One level     Bathroom Shower/Tub: Tub/shower unit;Walk-in shower   Bathroom Toilet: Standard Bathroom Accessibility: Yes How Accessible: Accessible via walker Home Equipment: Cane - single point;Shower seat          Prior Functioning/Environment Level of Independence: Independent                 OT Problem List: Decreased strength;Decreased range of motion;Decreased activity tolerance;Impaired balance (sitting and/or standing);Decreased coordination;Decreased knowledge of use of DME or AE;Decreased knowledge of precautions;Cardiopulmonary status limiting activity;Impaired sensation;Obesity;Impaired UE functional use;Pain      OT Treatment/Interventions: Self-care/ADL training;Therapeutic exercise;Neuromuscular education;DME and/or AE instruction;Therapeutic activities;Patient/family education    OT Goals(Current goals can be found in the care plan section) Acute Rehab OT Goals Patient Stated Goal: to be able to get home and feel better OT Goal  Formulation: With patient Time For Goal Achievement: 04/07/19 Potential to Achieve Goals: Good ADL Goals Pt Will Perform Eating: with modified independence;with adaptive utensils;sitting Pt Will Perform Grooming: with modified independence;standing;with adaptive equipment Pt/caregiver will Perform Home Exercise Program: Increased ROM;Right Upper extremity;Increased strength;With written HEP provided;With theraputty  OT Frequency: Min 3X/week   Barriers to D/C:            Co-evaluation              AM-PAC OT "6 Clicks" Daily Activity     Outcome Measure Help from another person eating meals?: A Little Help from another person taking care of personal grooming?: A Little Help from another person toileting, which includes using toliet, bedpan, or urinal?: A Little Help from another person bathing (including washing, rinsing, drying)?: A Little Help from another person to put on and taking off regular upper body clothing?: A Little Help from another person to put on and taking off regular lower body clothing?: A Little 6 Click Score: 18   End of Session Equipment Utilized During Treatment: Gait belt Nurse Communication: Mobility status;Other (comment)(emptied 300 of urine)  Activity Tolerance: Patient tolerated treatment well Patient left: in chair;with call bell/phone within reach  OT Visit Diagnosis: Muscle weakness (generalized) (M62.81);Pain;Unsteadiness on feet (R26.81) Pain - part of body: (sternum)                Time: 9528-4132 OT Time Calculation (min): 19 min Charges:  OT General Charges $OT Visit: 1 Visit OT Evaluation $OT Eval Moderate Complexity: [place] OTR/L Acute Rehabilitation Services Pager: [phone] Office: [place] 03/24/2019, 6:13 PM

## 2019-03-24 NOTE — Progress Notes (Signed)
Physical Therapy Treatment Patient Details Name: [name] [name] MRN: [id number] DOB: 1961-03-10 Today's Date: 03/24/2019    History of Present Illness [name] [name] is a 58 y.o. male with medical history significant of COPD, PAF on amiodarone, not on oral anticoagulation, CAD, admitted for unstable angina at end of Feb, underwent LHC with PCI to 90% RCA lesion, unfortunately this ended up being complicated by type A aortic dissection. Went home, then presented with 3 day history of SOB.    PT Comments    Pt making excellent progress towards his physical therapy goals. Able to progress to ambulating without an assistive device, 400 feet x 2 with one standing rest break. Continues with parasternal pain which is alleviated with pain medication. Noted DOE 2/4 during mobility, but SpO2 96%. Will continue to benefit from increased ambulation as tolerated and balance training.     Follow Up Recommendations  No PT follow up     Equipment Recommendations  None recommended by PT    Recommendations for Other Services OT consult     Precautions / Restrictions Precautions Precautions: Sternal Precaution Comments: wound vac Restrictions Weight Bearing Restrictions: No Other Position/Activity Restrictions: sternal precautions    Mobility  Bed Mobility               General bed mobility comments: in recliner upon arrival  Transfers Overall transfer level: Needs assistance Equipment used: None Transfers: Sit to/from Stand Sit to Stand: Supervision         General transfer comment: good power up technique  Ambulation/Gait Ambulation/Gait assistance: Min guard Gait Distance (Feet): 400 Feet(x2) Assistive device: None Gait Pattern/deviations: Step-through pattern;Decreased stride length;Wide base of support Gait velocity: reduced   General Gait Details: Pt with mild dynamic unsteadiness, requiring min guard assist without AD. Cues for activity pacing. Noted decreased  arm swing    Stairs             Wheelchair Mobility    Modified Rankin (Stroke Patients Only)       Balance Overall balance assessment: Needs assistance Sitting-balance support: Feet supported;No upper extremity supported Sitting balance-Leahy Scale: Good     Standing balance support: Bilateral upper extremity supported;During functional activity Standing balance-Leahy Scale: Fair                              Cognition Arousal/Alertness: Awake/alert Behavior During Therapy: WFL for tasks assessed/performed Overall Cognitive Status: Within Functional Limits for tasks assessed                                        Exercises      General Comments        Pertinent Vitals/Pain Pain Assessment: Faces Faces Pain Scale: Hurts even more Pain Location: parasternal, hip pain Pain Descriptors / Indicators: Constant;Operative site guarding Pain Intervention(s): Monitored during session;Premedicated before session    Home Living                      Prior Function            PT Goals (current goals can now be found in the care plan section) Acute Rehab PT Goals Patient Stated Goal: to be able to get home and feel better PT Goal Formulation: With patient Time For Goal Achievement: 04/03/19 Potential to Achieve Goals: Good Progress towards PT  goals: Progressing toward goals    Frequency    Min 3X/week      PT Plan Current plan remains appropriate    Co-evaluation              AM-PAC PT "6 Clicks" Mobility   Outcome Measure  Help needed turning from your back to your side while in a flat bed without using bedrails?: A Little Help needed moving from lying on your back to sitting on the side of a flat bed without using bedrails?: A Little Help needed moving to and from a bed to a chair (including a wheelchair)?: A Little Help needed standing up from a chair using your arms (e.g., wheelchair or bedside chair)?:  None Help needed to walk in hospital room?: A Little Help needed climbing 3-5 steps with a railing? : A Little 6 Click Score: 19    End of Session   Activity Tolerance: Patient tolerated treatment well Patient left: in chair;with call bell/phone within reach Nurse Communication: Mobility status PT Visit Diagnosis: Unsteadiness on feet (R26.81);Other abnormalities of gait and mobility (R26.89);Muscle weakness (generalized) (M62.81)     Time: 9798-9211 PT Time Calculation (min) (ACUTE ONLY): 13 min  Charges:  $Therapeutic Activity: 8-22 mins                     Ellamae Sia, PT, DPT Acute Rehabilitation Services Pager [phone] Office [phone]    Willy Eddy 03/24/2019, 9:54 AM

## 2019-03-24 NOTE — Progress Notes (Signed)
Pharmacy Antibiotic Note  [name] [name] is a 59 y.o. male admitted on 03/18/2019 with sepsis.  Pharmacy has been consulted for Vancomycin and Aztreonam dosing. New purulent drainage noted from sternal wound, will go to OR for debridement and VAC placement on 3/24.  Currently on day#7 of antibiotics.   Vancomycin peak of 34 and trough of 13 providing calculated AUC of 599 which is above recommended goal of 400 to 550 on dose of 1725m IV every 12 hours. Will reduce dosing for estimated AUC closer to 512. SCr is stable at 0.79. Patient has good urine output.   Plan: Will adjust Vancomycin order down to 1500 mg IV every 12 hours for expected AUC of 512.  Will f/u renal function, micro data, and pt's clinical condition   Height: 5' 8"  (172.7 cm) Weight: 235 lb 3.7 oz (106.7 kg) IBW/kg (Calculated) : 68.4  Temp (24hrs), Avg:98.2 F (36.8 C), Min:97.7 F (36.5 C), Max:98.7 F (37.1 C)  Recent Labs  Lab 03/18/19 0332 03/18/19 0643 03/19/19 0113 03/20/19 0225 03/21/19 0211 03/21/19 1155 03/22/19 0215 03/23/19 0312 03/24/19 0211 03/24/19 0648 03/24/19 1655  WBC  --   --  10.0 9.1 10.1  --  7.7 6.9 8.1  --   --   CREATININE  --   --  0.66 0.81 0.76  --  0.79 0.79  --   --   --   LATICACIDVEN 2.9* 1.4  --   --   --   --   --   --   --   --   --   VANCOTROUGH  --   --   --   --   --   --  5*  --   --   --  13*  VANCOPEAK  --   --   --   --   --  14*  --   --   --  34  --     Estimated Creatinine Clearance: 119.2 mL/min (by C-G formula based on SCr of 0.79 mg/dL).    Allergies  Allergen Reactions  . Penicillins Hives, Rash and Other (See Comments)    Did it involve swelling of the face/tongue/throat, SOB, or low BP? Yes Did it involve sudden or severe rash/hives, skin peeling, or any reaction on the inside of your mouth or nose? Yes  Did you need to seek medical attention at a hospital or doctor's office? Yes When did it last happen?As a child. If all above answers are  "NO", may proceed with cephalosporin use.    Antimicrobials this admission: 3/20 Vanc >>  3/20 Aztreonam >>  3/20 Flagyl >> 3/22  Microbiology results: 3/23 Wound cx: ngx1 day 3/20 BCx: ngx3 days 3/20 UCx:  ngx3 days RSV panel: neg MRSA PCR: neg   Thank you for allowing pharmacy to be a part of this patient's care.  JSloan Leiter PharmD, BCPS, BCCCP Clinical Pharmacist Please refer to [facility]for MSag[facility]wn[facility] 03/24/2019 5:54 PM

## 2019-03-24 NOTE — Progress Notes (Signed)
2 Days Post-Op Procedure(s) (LRB): STERNAL WOUND DEBRIDEMENT (N/A) APPLICATION OF WOUND VAC (N/A) Subjective: Superficial sternal wound infection- wound vac in place Operative cultures negative PICC placed for home iv antibiotics [ vancomycin] Wound vac first change will be done in OR tomorrow because of persistent tenderness and pain related by patient Objective: Vital signs in last 24 hours: Temp:  [97.7 F (36.5 C)-98.4 F (36.9 C)] 98.4 F (36.9 C) (03/26 0751) Pulse Rate:  [64-73] 73 (03/26 0322) Cardiac Rhythm: Normal sinus rhythm (03/26 0802) Resp:  [13-17] 13 (03/26 0322) BP: (122-136)/(58-76) 136/75 (03/26 0322) SpO2:  [95 %-98 %] 96 % (03/26 0751) Weight:  [106.7 kg] 106.7 kg (03/26 0644)  Hemodynamic parameters for last 24 hours:  afebrile , nsr  Intake/Output from previous day: 03/25 0701 - 03/26 0700 In: 1538.4 [P.O.:300; IV Piggyback:1238.4] Out: 3500 [Urine:3475; Drains:25] Intake/Output this shift: Total I/O In: 360 [P.O.:360] Out: 300 [Urine:300]  Alert c/o surgical pain Lungs clear No murmur Serosanguinous fluid from [facility] drain- about 50 cc daily  Lab Results: Recent Labs    03/23/19 0312 03/24/19 0211  WBC 6.9 8.1  HGB 8.3* 8.3*  HCT 27.0* 27.9*  PLT 423* 425*   BMET:  Recent Labs    03/22/19 0215 03/23/19 0312  NA 134* 137  K 4.0 4.4  CL 101 102  CO2 27 28  GLUCOSE 130* 128*  BUN 13 11  CREATININE 0.79 0.79  CALCIUM 9.3 9.4    PT/INR: No results for input(s): LABPROT, INR in the last 72 hours. ABG    Component Value Date/Time   PHART 7.451 (H) 03/18/2019 0329   HCO3 24.1 03/18/2019 0329   TCO2 25 03/18/2019 0329   ACIDBASEDEF 3.0 (H) 02/24/2019 0155   O2SAT 91.0 03/18/2019 0329   CBG (last 3)  No results for input(s): GLUCAP in the last 72 hours.  Assessment/Plan: S/P Procedure(s) (LRB): STERNAL WOUND DEBRIDEMENT (N/A) APPLICATION OF WOUND VAC (N/A) Superficial sternal wound infection after emergency repair of type A  aortic dissection Mild pericardial effusion with intact aortic graft Postop anemia  Plan cont iv antibiotics and wound care with VAC change in OR tomorrow   LOS: 6 days    [name] [name] 03/24/2019

## 2019-03-24 NOTE — Consult Note (Signed)
Cardiology Consult    Patient ID: MATIN MATTIOLI MRN: [id number], DOB/AGE: [birth year]   Admit date: 03/18/2019 Date of Consult: 03/24/2019  Primary Physician: Shirline Frees, MD Primary Cardiologist: Previously followed by Dr. Mare Ferrari. Requesting Provider: Dana Allan, MD  Patient Profile    [name] [name] is a 58 y.o. male with a history of CAD s/p PCI with DES to RCA in 01/6833 complicated by type A aortic dissection s/p repair with single vessel CABG (SVG to RCA), post-op atrial fibrillation/flutter on Amiodarone, CVA, hypertension, hyperlipidemia, COPD, and tobacco abuse who was admitted for sepsis secondary to sternal abscess. Cardiology was consulted for assistance with antiplatelets and anticoagulation.  History of Present Illness    [name] is a 58 year old male with the above history. He was recently admitted from 02/22/2019 to 03/05/2019 after presenting with unstable angina. Left heart catheterization was performed and showed 90% stenosis of the proximal RCA. He underwent PCI with DES x2 to the RCA; however, procedure was complicated by iatrogenic aortic root dissection likely due to guide catheter trauma. CT surgery was consulted and patient was taken to the OR emergently for repair of acute type A ascending thoracic aortic dissection and single vessel CABG (SVG to RCA). He required support with Milrinone and low-dose norepinephrine after surgery. Patient also noted to have transient post-op atrial fibrillation treated successfully with IV amio. He was discharged on Amiodarone 473m twice daily with instructions to transition to 2038mtwice daily after 1 week. Patient was also discharged on Aspirin 32550maily but was not discharged on any additional antiplatelets or anticoagulation per recommendation of CT surgery.   Patient presented to the ED on 03/18/19 for chest pain and shortness of breath and was admitted for sepsis secondary to sternal abscess. Chest CT showed  aortic graft was intact but noted rim enhancing fluid collections below sternotomy suggesting abscess as well as a moderate pericardial effusion. Cultures were obtained and patient was started on antibiotics. CT surgery was consulted and she underwent I&D and wound VAC of post-surgical wound on 03/22/2019. Cardiology was consulted for assistance with antiplatelets and anticoagulation. He is scheduled to go back to OR tomorrow for wound vac change.   Currently feels ok. Denies CP, palpitations, orthopnea or PND. + LE edema   Past Medical History   Past Medical History:  Diagnosis Date   Adenomatous colon polyp 2009   Alcohol abuse    abstinent since 1992   Arthritis    back   Asthma    Barrett's esophagus 05/22/2015   Chronic back pain    COPD (chronic obstructive pulmonary disease) ([facility]  Coronary artery disease    a. cath in 2010 showing 20% distal LM, 70-80% Ost LAD, 30% OM, and 40% RCA b. low-risk NST in 2014 and 2016   GERD (gastroesophageal reflux disease)    takes Nexium daily   Gout    takes ALlopurinol daily   Hemorrhoids    History of bronchitis 2015   History of kidney stones    Hyperlipidemia    was given a script a month ago but scared to take it.Medical Md is aware   Hypertension    takes Losartan,Metoprolol,and Amlodipine daily   Joint pain    Lymphocytic colitis 2009   [facility] (admit for CP 11/2009; ruled out for MI but had 70-80% ostail LAD stenosis and treated medically following NL perfusion study)   NASH (nonalcoholic steatohepatitis)    Peripheral vascular  disease [facility])    Personal history of colonic polyps 07/20/2002   Diminutive adenomas (3) 06/2002 diminutive adenoma (1) 2011    Pre-diabetes    Stroke [facility])    TIA (transient ischemic attack)    Urinary urgency     Past Surgical History:  Procedure Laterality Date   ANEURYSM COILING  12/2010   cerebral   APPLICATION OF WOUND VAC N/A  03/22/2019   Procedure: APPLICATION OF WOUND VAC;  Surgeon: Ivin Poot, MD;  Location: [place];  Service: Thoracic;  Laterality: N/A;   BAND HEMORRHOIDECTOMY  2003   at sigmoidoscopy   CARDIAC CATHETERIZATION  2010   CHOLECYSTECTOMY  08/03/2015   CHOLECYSTECTOMY N/A 08/03/2015   Procedure: LAPAROSCOPIC CHOLECYSTECTOMY WITH INTRAOPERATIVE CHOLANGIOGRAM;  Surgeon: Fanny Skates, MD;  Location: [place];  Service: General;  Laterality: N/A;   COLONOSCOPY  multiple   CORONARY ARTERY BYPASS GRAFT N/A 02/23/2019   Procedure: CORONARY ARTERY BYPASS GRAFTING (CABG) x 1; Using Endoscopically Harvested Right Leg Greater Saphenous Vein Graft (SVG); SVG to RCA;  Surgeon: Ivin Poot, MD;  Location: [place];  Service: Open Heart Surgery;  Laterality: N/A;   CORONARY STENT INTERVENTION N/A 02/23/2019   Procedure: CORONARY STENT INTERVENTION;  Surgeon: Martinique, Peter M, MD;  Location: [place] CV LAB;  Service: Cardiovascular;  Laterality: N/A;   ESOPHAGOGASTRODUODENOSCOPY     KNEE ARTHROSCOPY WITH MEDIAL MENISECTOMY Left 05/20/2017   Procedure: LEFT KNEE ARTHROSCOPY WITH PARTIAL MEDIAL MENISCECTOMY;  Surgeon: Leandrew Koyanagi, MD;  Location: [place];  Service: Orthopedics;  Laterality: Left;   LEFT HEART CATH AND CORONARY ANGIOGRAPHY N/A 02/23/2019   Procedure: LEFT HEART CATH AND CORONARY ANGIOGRAPHY;  Surgeon: Martinique, Peter M, MD;  Location: [place] CV LAB;  Service: Cardiovascular;  Laterality: N/A;   LUMBAR SPINE SURGERY     x 2   NASAL SINUS SURGERY     REPAIR OF ACUTE ASCENDING THORACIC AORTIC DISSECTION N/A 02/23/2019   Procedure: REPAIR OF TYPE A  - ACUTE ASCENDING THORACIC AORTIC DISSECTION, using Hemashield Platinum Woven Double Velour Vascular Graft (D: 31m, L: 30cm);  Surgeon: VPrescott Gum PCollier Salina MD;  [place]  Service: Vascular;  Laterality: N/A;   STERNAL WOUND DEBRIDEMENT N/A 03/22/2019   Procedure: STERNAL WOUND DEBRIDEMENT;  Surgeon: VIvin Poot  MD;  [place]  Service: Thoracic;  Laterality: N/A;   TEE WITHOUT CARDIOVERSION  05/28/2012   Procedure: TRANSESOPHAGEAL ECHOCARDIOGRAM (TEE);  Surgeon: BLelon Perla MD;  [facility]  Service: Cardiovascular;  Laterality: N/A;   TEE WITHOUT CARDIOVERSION N/A 02/23/2019   Procedure: TRANSESOPHAGEAL ECHOCARDIOGRAM (TEE);  Surgeon: VPrescott Gum PCollier Salina MD;  [place]  Service: Open Heart Surgery;  Laterality: N/A;     Allergies  Allergies  Allergen Reactions   Penicillins Hives and Rash    Did it involve swelling of the face/tongue/throat, SOB, or low BP? Yes Did it involve sudden or severe rash/hives, skin peeling, or any reaction on the inside of your mouth or nose? Yes  Did you need to seek medical attention at a hospital or doctor's office? Yes When did it last happen?As a child. If all above answers are NO, may proceed with cephalosporin use.     Inpatient Medications     sodium chloride   Intravenous Once   allopurinol  100 mg Oral Daily   amiodarone  200 mg Oral BID   atorvastatin  80 mg Oral q1800   carvedilol  25 mg Oral  BID WC   colchicine  0.6 mg Oral Daily   ferrous [id number] C-folic acid  1 capsule Oral TID PC   ipratropium-albuterol  3 mL Nebulization BID   losartan  100 mg Oral Daily   pantoprazole  80 mg Oral BID   sodium chloride flush  10-40 mL Intracatheter Q12H   torsemide  20 mg Oral BID   traZODone  100 mg Oral QHS    Family History    Family History  Problem Relation Age of Onset   Liver disease Mother    Liver cancer Mother    Breast cancer Mother    Cancer Mother    Hypertension Mother    Heart attack Mother    Cancer Father    Heart disease Father        before age 67   Hyperlipidemia Father    Hypertension Father    Heart attack Father    Heart disease Other        multiple family members on maternal and paternal side of family   Diabetes Sister    Hyperlipidemia  Sister    Hypertension Sister    Diabetes Paternal Grandmother    Hypertension Brother    Colon cancer Neg Hx    He indicated that his mother is deceased. He indicated that his father is deceased. He indicated that the status of his sister is unknown. He indicated that the status of his brother is unknown. He indicated that the status of his paternal grandmother is unknown. He indicated that the status of his neg hx is unknown. He indicated that the status of his other is unknown.   Social History    Social History   Socioeconomic History   Marital status: Married    Spouse name: Not on file   Number of children: 3   Years of education: Not on file   Highest education level: Not on file  Occupational History   Occupation: Disabled    Employer: UNEMPLOYED  Social Designer, fashion/clothing strain: Not on file   Food insecurity:    Worry: Not on file    Inability: Not on file   Transportation needs:    Medical: Not on file    Non-medical: Not on file  Tobacco Use   Smoking status: Former Smoker    Packs/day: 1.00    Years: 4.00    Pack years: 4.00    Types: Cigarettes    Last attempt to quit: 12/22/2018    Years since quitting: 0.2   Smokeless tobacco: Never Used  Substance and Sexual Activity   Alcohol use: No    Alcohol/week: 0.0 standard drinks    Comment: no alcohol in 82yr    Drug use: No   Sexual activity: Not on file  Lifestyle   Physical activity:    Days per week: Not on file    Minutes per session: Not on file   Stress: Not on file  Relationships   Social connections:    Talks on phone: Not on file    Gets together: Not on file    Attends religious service: Not on file    Active member of club or organization: Not on file    Attends meetings of clubs or organizations: Not on file    Relationship status: Not on file   Intimate partner violence:    Fear of current or ex partner: Not on file    Emotionally abused: Not on file  Physically abused: Not on file    Forced sexual activity: Not on file  Other Topics Concern   Not on file  Social History Narrative   Not on file     Review of Systems    ROS  Physical Exam    Blood pressure 118/64, pulse 68, temperature 98.7 F (37.1 C), temperature source Oral, resp. rate 13, height 5' 8"  (1.727 m), weight 106.7 kg, SpO2 93 %.  General: 58 y.o. male sitting in chair  in no acute distress. Pleasant and cooperative. HEENT: Normal  Neck: Supple. No carotid bruits. JVP 10 Lungs: No increased work of breathing. Clear to auscultation bilaterally. No wheezes, rhonchi, or rales. Heart: + wound vac in place  RRR. Distinct S1 and S2. No murmurs, gallops, or rubs.  Abdomen:  Obese soft, non-distended, and non-tender to palpation. Bowel sounds present in all 4 quadrants.   Extremities: No clubbing, cyanosis or 2+ edema. Radial, posterior tibial, and distal pedal pulses 2+ and equal bilaterally. Skin: Warm and dry. Neuro: Alert and oriented x3. No focal deficits. Moves all extremities spontaneously. Psych: Normal affect.  Labs    Troponin (Point of Care Test) No results for input(s): TROPIPOC in the last 72 hours. No results for input(s): CKTOTAL, CKMB, TROPONINI in the last 72 hours. Lab Results  Component Value Date   WBC 8.1 03/24/2019   HGB 8.3 (L) 03/24/2019   HCT 27.9 (L) 03/24/2019   MCV 88.9 03/24/2019   PLT 425 (H) 03/24/2019    Recent Labs  Lab 03/18/19 0315  03/23/19 0312  NA 137   < > 137  K 4.2   < > 4.4  CL 104   < > 102  CO2 27   < > 28  BUN 13   < > 11  CREATININE 1.02   < > 0.79  CALCIUM 9.2   < > 9.4  PROT 6.8  --   --   BILITOT 0.5  --   --   ALKPHOS 106  --   --   ALT 29  --   --   AST 19  --   --   GLUCOSE 123*   < > 128*   < > = values in this interval not displayed.   Lab Results  Component Value Date   CHOL 156 02/23/2019   HDL 31 (L) 02/23/2019   LDLCALC 105 (H) 02/23/2019   TRIG 101 02/23/2019   Lab Results   Component Value Date   DDIMER <0.27 11/02/2015     Radiology Studies    Dg Chest 2 View  Result Date: 03/03/2019 CLINICAL DATA:  Status post aortic dissection repair EXAM: CHEST - 2 VIEW COMPARISON:  03/01/2019 FINDINGS: Right arm PICC terminates at the cavoatrial junction. Lungs are essentially clear.  No pleural effusion or pneumothorax. The heart is top-normal in size. Postsurgical changes related to prior CABG. Surgical clips and skin staples overlying the right lateral chest wall/axilla. Median sternotomy. IMPRESSION: No evidence of acute cardiopulmonary disease. Postsurgical changes, as above. Electronically Signed   By: Julian Hy M.D.   On: 03/03/2019 09:28   Dg Chest 2 View  Result Date: 02/22/2019 CLINICAL DATA:  Shortness of breath. EXAM: CHEST - 2 VIEW COMPARISON:  01/18/2019. FINDINGS: Mediastinum and hilar structures normal. Stable cardiomegaly with normal pulmonary vascularity. Mild bilateral interstitial prominence. Pneumonitis could present this fashion. No pleural effusion or pneumothorax. No acute bony abnormality. IMPRESSION: 1. Mild bilateral pulmonary interstitial prominence. Pneumonitis could present this fashion.  2.  Stable cardiomegaly.  Normal pulmonary vascularity. Electronically Signed   By: Marcello Moores  Register   On: 02/22/2019 12:21   Dg Chest Port 1 View  Result Date: 03/21/2019 CLINICAL DATA:  Status post coronary bypass graft. EXAM: PORTABLE CHEST 1 VIEW COMPARISON:  Radiograph of March 18, 2019. FINDINGS: Stable cardiomediastinal silhouette. Status post coronary bypass graft. No pneumothorax or pleural effusion is noted. No acute pulmonary disease is noted. Surgical clips are seen in right axillary region. Bony thorax is unremarkable. IMPRESSION: No acute cardiopulmonary abnormality seen. Electronically Signed   By: Marijo Conception, M.D.   On: 03/21/2019 07:24   Dg Chest Portable 1 View  Result Date: 03/18/2019 CLINICAL DATA:  Pain EXAM: PORTABLE CHEST 1 VIEW  COMPARISON:  03/03/2019 FINDINGS: Prior CABG. Cardiomegaly. No confluent opacities, effusions or edema. No acute bony abnormality. IMPRESSION: Cardiomegaly.  No active disease. Electronically Signed   By: Rolm Baptise M.D.   On: 03/18/2019 03:36   Dg Chest Port 1 View  Result Date: 03/01/2019 CLINICAL DATA:  Status post CABG EXAM: PORTABLE CHEST 1 VIEW COMPARISON:  Chest radiograph from one day prior. FINDINGS: Right PICC terminates at the cavoatrial junction. Skin staples and surgical clips overlie the right axilla. Intact sternotomy wires. Stable cardiomediastinal silhouette with mild cardiomegaly. No pneumothorax. No pleural effusion. Cephalization of the pulmonary vasculature without overt pulmonary edema. No acute consolidative airspace disease. IMPRESSION: Stable mild cardiomegaly without overt pulmonary edema. Electronically Signed   By: Ilona Sorrel M.D.   On: 03/01/2019 09:23   Dg Chest Port 1 View  Result Date: 02/28/2019 CLINICAL DATA:  58 year old male postoperative day 5 from the emergency repair of type A aortic dissection and CABG. EXAM: PORTABLE CHEST 1 VIEW COMPARISON:  02/27/2019 and earlier. FINDINGS: Portable AP semi upright view at 0537 hours. The medial right chest tube has been removed. No pneumothorax. Right IJ approach introducer sheath has been removed. Right upper extremity approach PICC line remains in place. Stable somewhat low lung volumes. Stable cardiac size and mediastinal contours. No pulmonary edema, pleural effusion or confluent pulmonary opacity. Visualized tracheal air column is within normal limits. Paucity bowel gas in the upper abdomen. IMPRESSION: 1. Right chest tube removed. No pneumothorax. 2. Low lung volumes. No acute cardiopulmonary abnormality. Electronically Signed   By: Genevie Ann M.D.   On: 02/28/2019 06:32   Dg Chest Port 1 View  Result Date: 02/27/2019 CLINICAL DATA:  Follow-up chest tube EXAM: PORTABLE CHEST 1 VIEW COMPARISON:  02/26/2011 FINDINGS: Right  jugular sheath remains in place although the Swan-Ganz catheter has been removed. The endotracheal tube and gastric catheter have been removed as well. Right-sided PICC line and right thoracostomy catheter are noted in satisfactory position. No pneumothorax is seen. Mild right basilar atelectasis is noted. The left lung remains clear. IMPRESSION: No pneumothorax on the right. Mild right basilar atelectasis is noted. Electronically Signed   By: Inez Catalina M.D.   On: 02/27/2019 08:18   Dg Chest Port 1 View  Result Date: 02/26/2019 CLINICAL DATA:  History of aortic dissection EXAM: PORTABLE CHEST 1 VIEW COMPARISON:  02/25/2019 FINDINGS: Cardiac shadow is stable. Swan-Ganz catheter, endotracheal tube, gastric catheter and right thoracostomy tube are again identified and stable. The left thoracostomy tube and pericardial drain have been removed in the interval. Mediastinal drain remains in place. No pneumothorax is seen. The overall inspiratory effort is poor with mild left basilar atelectasis. New right-sided PICC line is noted at the cavoatrial junction. Postsurgical changes are again  noted and stable. IMPRESSION: Mild left basilar atelectasis. No pneumothorax following left-sided chest tube removal New right-sided PICC line in satisfactory position. Electronically Signed   By: Inez Catalina M.D.   On: 02/26/2019 08:55   Dg Chest Port 1 View  Result Date: 02/25/2019 CLINICAL DATA:  Status post coronary bypass graft. EXAM: PORTABLE CHEST 1 VIEW COMPARISON:  Radiograph of February 24, 2019. FINDINGS: Stable cardiomegaly. Endotracheal and nasogastric tubes are unchanged in position. Right internal jugular Swan-Ganz catheter is unchanged in position. Bilateral chest tubes are noted without pneumothorax. Mild bibasilar subsegmental atelectasis is noted with minimal pleural effusions. Bony thorax is unremarkable. IMPRESSION: Stable support apparatus. Stable bilateral chest tubes are noted without pneumothorax. Mild  bibasilar subsegmental atelectasis is noted with minimal pleural effusions. Electronically Signed   By: Marijo Conception, M.D.   On: 02/25/2019 09:23   Dg Chest Port 1 View  Result Date: 02/24/2019 CLINICAL DATA:  58 year old male status post ascending aortic aneurysm repair. EXAM: PORTABLE CHEST 1 VIEW COMPARISON:  Earlier radiograph dated 02/24/2019 FINDINGS: Support lines and tubes in similar position. There is shallow inspiration with bibasilar atelectasis. Infiltrate is not excluded. Clinical correlation is recommended. No large pleural effusion. No pneumothorax. Enlarged cardiomediastinal silhouette similar to prior radiograph. Surgical clips over the right shoulder. No acute osseous pathology. IMPRESSION: No significant interval change. Electronically Signed   By: Anner Crete M.D.   On: 02/24/2019 06:57   Dg Chest Portable 1 View  Result Date: 02/24/2019 CLINICAL DATA:  58 year old male with postop heart surgery. EXAM: PORTABLE CHEST 1 VIEW COMPARISON:  Chest radiograph dated 02/22/2019 FINDINGS: Endotracheal tube approximately 4 cm above the carina. Enteric tube extends into the left hemiabdomen with tip beyond the inferior margin of the image. Swan-Ganz catheter with tip in the region of the left pulmonary artery. Bilateral chest tubes as well as mediastinal drain. Shallow inspiration with bibasilar linear atelectasis. Pneumonia is not excluded. No large pleural effusion. No pneumothorax. Mildly enlarged cardiomediastinal silhouette. No acute osseous pathology. Median sternotomy wires and right subclavian surgical clips. IMPRESSION: 1. Shallow inspiration with bibasilar atelectasis. Pneumonia is not excluded. 2. Postoperative changes of open heart surgery with support devices as described. Electronically Signed   By: Anner Crete M.D.   On: 02/24/2019 04:47   Ct Angio Chest Aorta W/cm &/or Wo/cm  Result Date: 03/18/2019 CLINICAL DATA:  Stiffness in the back beginning a few hours ago.  Recent ascending aortic dissection repair EXAM: CT ANGIOGRAPHY CHEST WITH CONTRAST TECHNIQUE: Multidetector CT imaging of the chest was performed using the standard protocol during bolus administration of intravenous contrast. Multiplanar CT image reconstructions and MIPs were obtained to evaluate the vascular anatomy. CONTRAST:  59m OMNIPAQUE IOHEXOL 350 MG/ML SOLN COMPARISON:  Chest CTA 02/23/2019. There is some kind of archive error, only coronal reformats are available from that study. FINDINGS: Cardiovascular: Recent ascending aortic repair with patent graft. No cardiomegaly. There is a moderate pericardial effusion that is lobulated in places, up to 14 mm in thickness. Substernal low-density with perceptible enhancing wall you have been in the arterial phase. On axial slices the collection measures up to 4 cm in maximal diameter. The mediastinal fat and presternal fat is stranded. Wound is not dehiscent and there is no clear involucrum or other osteomyelitis changes, although recent surgery. There does appear to be low-density rim enhancing collection rightward from the graft measuring up to 4 cm. Mural irregularity along the anterior and right aspect of the proximal graft is favored to reflect postoperative  change. Single-vessel CABG to the right coronary circulation that is patent proximally. Pulmonary arteries are patent. Dissection along the arch and descending aorta with termination before the aortic hiatus. There is mild opacification of the false lumen that is progressed. When compared on coronal images the dissection has mildly propagated in the descending segment. Mediastinum/Nodes: Adenopathy considered reactive. Lungs/Pleura: There is no edema, consolidation, effusion, or pneumothorax. Upper Abdomen: No acute finding Musculoskeletal: Sternal findings described above. These results were were discussed via telephone at the time of interpretation on 03/18/2019 at 5:56 am with Dr. Jennette Kettle. Review  of the MIP images confirms the above findings. IMPRESSION: 1. Rim enhancing fluid collections below the sternotomy and rightward of the ascending aortic graft, history suggesting abscess. Moderate pericardial effusion. 2. Patent ascending aortic graft and right coronary graft. Mural irregularity along the proximal aortic graft is likely postoperative. If location is discordant with operative findings a follow-up gated study may be used to exclude an early paravalvular leak. 3. Known descending aortic dissection with increased enhancement within the false lumen. There has been mild propagation of the dissection towards the aortic hiatus. Electronically Signed   By: Monte Fantasia M.D.   On: 03/18/2019 06:01   Ct Angio Chest/abd/pel For Dissection W And/or W/wo  Result Date: 02/23/2019 CLINICAL DATA:  Iatrogenic thoracic aortic dissection during cardiac catheterization. EXAM: CT ANGIOGRAPHY CHEST, ABDOMEN AND PELVIS TECHNIQUE: Multidetector CT imaging through the chest, abdomen and pelvis was performed using the standard protocol during bolus administration of intravenous contrast. Multiplanar reconstructed images and MIPs were obtained and reviewed to evaluate the vascular anatomy. CONTRAST:  164m ISOVUE-370 IOPAMIDOL (ISOVUE-370) INJECTION 76% COMPARISON:  None. FINDINGS: CTA CHEST FINDINGS Vascular Findings: Examination is positive for type a thoracic aortic dissection originating at the aortic root, extending through the aortic arch to the level of the mid/distal aspect of the descending thoracic aorta. The dissection does not appear to extend to the level of the diaphragmatic hiatus. While the dissection does extend to abut the origin of the great vessels of the aortic arch, there is no definitive extension into any of the great vessels and it does not result in a definitive hemodynamically significant stenosis. There is minimal opacification of the false lumen of the dissection at the level of the aortic  arch (image 77, series 5), however the remainder of the dissection is without definitive opacification of the false lumen. Review of the precontrast images confirm the presence of an intramural hematoma. There is a minimal amount of fluid seen within the pericardial recess. No definitive contrast extravasation. The thoracic aorta is of normal caliber with measurements as follows. Cardiomegaly. Coronary artery calcifications. A stent is seen within the origin of the right coronary artery, the patency of which is not evaluated on this non cardiac CTA examination. Although this examination was not tailored for the evaluation the pulmonary arteries, there are no discrete filling defects within the central pulmonary arterial tree to suggest central pulmonary embolism. Borderline enlarged caliber of the main pulmonary artery measuring 34 mm in diameter. ------------------------------------------------------------- Thoracic aortic measurements: Sinotubular junction 34 mm as measured in greatest oblique short axis coronal dimension. Proximal ascending aorta 38 mm as measured in greatest oblique short axis axial dimension at the level of the main pulmonary artery. Aortic arch aorta 32 mm as measured in greatest oblique short axis sagittal dimension. Proximal descending thoracic aorta 32 mm as measured in greatest oblique short axis axial dimension at the level of the main pulmonary artery. Distal descending  thoracic aorta 27 mm as measured in greatest oblique short axis axial dimension at the level of the diaphragmatic hiatus. Review of the MIP images confirms the above findings. ------------------------------------------------------------- Non-Vascular Findings: Mediastinum/Lymph Nodes: Scattered mediastinal lymph nodes are not enlarged by size criteria. No bulky mediastinal, hilar axillary lymphadenopathy. Lungs/Pleura: Minimal dependent subpleural ground-glass atelectasis. No discrete focal airspace opacities. No pleural  effusion or pneumothorax. No discrete pulmonary nodules. The central pulmonary airways appear widely patent. Musculoskeletal: No acute or aggressive osseous abnormalities within the chest. Stigmata of DISH within the lower thoracic spine. Regional soft tissues appear normal. Normal appearance of the thyroid gland. _________________________________________________________ _________________________________________________________ CTA ABDOMEN AND PELVIS FINDINGS VASCULAR Aorta: The known descending thoracic aorta does not extend to involve the abdominal aorta. There is a moderate to large amount of slightly irregular mixed calcified and noncalcified atherosclerotic plaque throughout the abdominal aorta, not resulting in a hemodynamically significant stenosis. No periaortic stranding. Celiac: There is a minimal amount of eccentric calcified atherosclerotic plaque involving the origin of the celiac artery, not resulting in a hemodynamically significant stenosis. SMA: There is a minimal amount of calcified atherosclerotic plaque involving the origin of the SMA, not resulting in hemodynamically significant stenosis. Conventional branching pattern. The SMA gives rise to the proper hepatic artery which supplies both the right and left hepatic arteries as well as a hypertrophied GDA. Renals: Solitary bilaterally; there is a minimal amount of eccentric noncalcified atherosclerotic plaque involving the origin of the left renal artery (axial image 169, series 5; coronal image 100, series 9 which results in approximately 50% luminal narrowing and associated mild downstream poststenotic dilatation. The right-sided renal artery is widely patent without hemodynamically significant narrowing. No vessel irregularity to suggest FMD. IMA: Widely patent without hemodynamically significant narrowing. Inflow: There is a moderate amount of slightly irregular mixed calcified and noncalcified atherosclerotic plaque involving the bilateral  common iliac arteries which approaches 50% luminal narrowing bilaterally (right - coronal image 98, series 9; left - image 225, series 5). The bilateral internal iliac arteries are disease though patent and of normal caliber. The bilateral external iliac arteries are mildly disease and tortuous but of normal caliber and without a definitive hemodynamically significant narrowing. Veins: The pelvic venous system and IVC appear patent on this arterial phase examination. Review of the MIP images confirms the above findings. _________________________________________________________ NON-VASCULAR Evaluation of the abdominal organs is limited to the arterial phase of enhancement and is further limited secondary to coned field of view. Hepatobiliary: Normal hepatic contour. There is diffuse decreased attenuation of the hepatic parenchyma on this postcontrast examination suggestive of hepatic steatosis. No discrete hyperenhancing hepatic lesions. Post cholecystectomy. No intra or extrahepatic biliary ductal dilatation. No ascites. Pancreas: Normal appearance of the pancreas. Spleen: Normal appearance of the spleen. Adrenals/Urinary Tract: There is symmetric enhancement and excretion of the bilateral kidneys. Contrast is seen within the bilateral collecting systems from recent cardiac catheterization. No evidence urinary obstruction. No definite renal stones this postcontrast examination. Note is made of an approximately 2.1 cm hypoattenuating left-sided renal cyst. There is an additional punctate subcentimeter left-sided renal cyst (image 161, series 5, which is too small to adequately characterize though favored to represent additional renal cysts. No definite right-sided renal lesions. No urinary obstruction or perinephric stranding. There is mild thickening of the right adrenal gland without discrete nodule. Normal appearance of the right adrenal gland. Excreted contrast is seen within the urinary bladder. Stomach/Bowel:  Colonic diverticulosis without evidence of diverticulitis. Bowel is otherwise normal in course and  caliber without discrete area of wall thickening. Normal appearance of the terminal ileum and the retrocecal appendix. No definite pneumatosis or free venous gas. Evaluation for pneumoperitoneum is degraded secondary to exclusion of the ventral most aspect of the abdominal wall. Lymphatic: No bulky retroperitoneal, mesenteric, pelvic or inguinal lymphadenopathy. Reproductive: Dystrophic calcifications within normal sized prostate gland. No free fluid pelvic cul-de-sac. Other: There is a minimal subcutaneous edema about the midline of the low back. Small bilateral mesenteric fat containing inguinal hernias. Focal subcutaneous stranding and emphysema about the right abdominal pannus likely represents sequela of subcutaneous medication administration. Musculoskeletal: No acute or aggressive osseous abnormalities. Post L4-L5 paraspinal fusion intervertebral disc space replacement without evidence of hardware failure or loosening. Review of the MIP images confirms the above findings. IMPRESSION: Chest CTA impression: 1. The examination is positive for type A thoracic aortic dissection originating at the level of the aortic root and extending through the aortic arch to the mid/distal descending thoracic aorta but not to the level of the diaphragmatic hiatus. There is minimal opacification of the false lumen of the dissection at the level of the aortic root, though the remainder of the false lumen of the dissection is not opacified. The dissection abuts the origin of the great vessels of the aortic arch without extension into or narrowing of the great vessels. No contrast extravasation or definitive periaortic stranding. 2. Post stenting of the origin and proximal aspect the right coronary artery, the patency of which is not evaluated on this non coronary CTA. 3. Cardiomegaly. Enlargement of the caliber the main pulmonary  artery, nonspecific though could be seen in the setting pulmonary arterial hypertension. Abdomen and pelvic CTA impression: 1. Moderate to large amount of atherosclerotic plaque throughout the abdominal aorta, not resulting in hemodynamically significant stenosis. There is no extension of the thoracic aortic to involve the abdominal aorta. 2. Suspected hemodynamically significant narrowing involving the origin of left artery without associated asymmetric renal atrophy or delayed enhancement. 3. Suspected hemodynamically significant narrowing involving the bilateral common iliac arteries. Correlation symptoms of PAD is advised. 4. Suspected hepatic steatosis. Correlation with LFTs could performed as indicated. 5. Colonic diverticulosis without evidence of superimposed acute diverticulitis. Above findings were discussed with Dr. Martin[place] at the time of examination completion. Electronically Signed   By: Sandi Mariscal M.D.   On: 02/23/2019 15:27   [name] Ekg Site Rite  Result Date: 03/23/2019 If Site Rite image not attached, placement could not be confirmed due to current cardiac rhythm.  [name] Ekg Site Rite  Result Date: 02/25/2019 If [facility] image not attached, placement could not be confirmed due to current cardiac rhythm.   EKG     EKG: EKG from admission was personally reviewed and demonstrates: Sinus tachycardia with PACs and known RBBB.  Telemetry: Telemetry was personally reviewed and demonstrates:  Cardiac Imaging    Echocardiogram 03/18/2019: 1. Respiratory related leftward septal shift with septal shudder. Respiratory variation in mitral inflow. Dilated IVC with blunted respiratory collapse adjacent to RA. No definite RV diastolic collapse, no signifcant RA collapse, howevere suboptimal  image quality in subcostal window. Findings suggests effusive constrictive physiology, with possible early tamponade. BP and HR reviewed as reported in study.  2. The left ventricle has normal systolic function  with an ejection fraction of 60-65%. The cavity size was normal. Left ventricular diastolic function could not be evaluated.  3. The right ventricle has normal systolic function. The cavity was normal. There is no increase in right ventricular wall thickness.  4. Small pericardial effusion.  5. The pericardial effusion is circumferential.  6. The inferior vena cava was dilated in size with <50% respiratory variability.  7. The aortic valve was not well visualized Aortic valve regurgitation was not assessed by color flow Doppler. _______________  Limited Echocardiogram 03/21/2019: 1. Moderate pericardial effusion primarily located peri-apically. There is > 25% respirophasic variation in the mitral E inflow velocity. Septal bounce is not marked. The IVC is dilated to 2.7 cm but with normal respirophasic variation. 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 If patient is symptomatic, would consider hemodynamic cath. 2. Mild calcification of the aortic valve. The aortic valve was not assessed by doppler. 3. The interatrial septum was not assessed. 4. No evidence of mitral valve stenosis. No mitral regurgitation. 5. Normal RV size with mildly decreased systolic function. 6. The left ventricle had a visually estimated ejection fraction of of 55%. The cavity size was normal. indeterminate diastolic function. Images inadequate for wall motion analysis.  Assessment & Plan    Sepsis Secondary to Sternal Abscess - Patient underwent I&D and wound VAC on 03/22/2019. - Management per primary team and CT surgery.  CAD - Recent left heart catheterization on 02/23/2019 showed 90% stenosis of the proximal RCA. He underwent PCI with DES x2 to the RCA; however, procedure was complicated by iatrogenic aortic root dissection likely due to guide catheter trauma. CT surgery was consulted and patient was taken to the OR emergently for repair of acute type  A ascending thoracic aortic dissection and single vessel CABG (SVG to RCA). - Would continue home Aspirin 339m daily. Would defer use of dual antiplatelet therapy with Plavix until course of debridement is complete.  - Continue beta blocker and high-intensity statin.   Post-Operative Atrial Fibrillation - Noted to have post-op atrial fibrillation after aortic dissection repair and single vessel CABG. Treated with IV amio and has not recurred   - EKG on admission showed sinus tachycardia with PACs. - Continue Amiodarone 2070mtwice daily. - Unless patient has additional episodes of atrial fibrillation, okay to hold off starting any chronic anticoagulation given short episodes of atrial fibrillation occurred in post-op period.   Moderate Pericardial Effusion - Small pericardial effusion noted on TEE last month.  - Echo on admission showed small circumferential pericardial effusion. Repeat limited Echo on 03/21/2019 showed moderate pericardial effusion.  - CT surgery started Colchicine for concern of pericarditis. Agree with management. No indication to tap currently.  - Does have some evidence of R-sided volume overload. Wil ldiurese as tolerate   Chronic Diastolic CHF - Documented output of 3.5 L in the last 24 hours with net negative 2.8 L since admission. - Transitioned from Lasix to Torsemide 2066moday. - Remains overloaded. Will continue IV lasix 80 IV bid  - Continue to monitor daily weights, I/O's, and renal function.  Hypertension - Most recent BP 118/64.  - Continue current medications.  Otherwise, per primary team.   Signed, CalDarreld McleanA-C 03/24/2019, 11:30 AM  For questions or updates, please contact   Please consult [email or web address] for contact info under Cardiology/STEMI.  Patient seen and examined with the above-signed Advanced Practice Provider and/or Housestaff. I personally reviewed laboratory data, imaging studies and relevant notes. I independently examined  the patient and formulated the important aspects of the plan. I have edited the note to reflect any of my changes or salient points. I have personally discussed the plan with the patient and/or family.  Patient had single episode post-op AF treated successfully with IV amio. No AC started given risk of bleeding after surgical repair of Ao dissection. AF has not recurred to date as far as we know. Thus would not start AC at this time. Continue oral amio.   Ideally could benefit from ASA/Plavix post-CABG but with need for wound vac changes would not start currently. Can consider as outpatient. No s/s of cardiac ischemia currently.  Has small to moderate pericardial effusion with evidence of R-sided volume overload but no overt tamponade. Agree with colchicine. Continue diuresis with IV lasix as tolerated.   We will follow.   Glori Bickers, MD  12:49 PM

## 2019-03-24 NOTE — Progress Notes (Signed)
Peripherally Inserted Central Catheter/Midline Placement  The IV Nurse has discussed with the patient and/or persons authorized to consent for the patient, the purpose of this procedure and the potential benefits and risks involved with this procedure.  The benefits include less needle sticks, lab draws from the catheter, and the patient may be discharged home with the catheter. Risks include, but not limited to, infection, bleeding, blood clot (thrombus formation), and puncture of an artery; nerve damage and irregular heartbeat and possibility to perform a PICC exchange if needed/ordered by physician.  Alternatives to this procedure were also discussed.  Bard Power PICC patient education guide, fact sheet on infection prevention and patient information card has been provided to patient /or left at bedside.    PICC/Midline Placement Documentation  PICC Single Lumen 03/24/19 PICC Right Brachial 41 cm 2 cm (Active)  Exposed Catheter (cm) 2 cm 03/24/2019  9:37 AM  Site Assessment Clean;Dry;Intact 03/24/2019  9:37 AM  Line Status Flushed;Blood return noted;Saline locked 03/24/2019  9:37 AM  Dressing Type Transparent;Securing device 03/24/2019  9:37 AM  Dressing Status Clean;Dry;Intact;Antimicrobial disc in place 03/24/2019  9:37 AM  Dressing Change Due 03/31/19 03/24/2019  9:37 AM       Frances Maywood 03/24/2019, 9:41 AM

## 2019-03-24 NOTE — TOC Transition Note (Addendum)
Transition of Care [facility]) - CM/SW Discharge Note   Patient Details  Name: [name] MRN: [id number] Date of Birth: 01-08-1961  Transition of Care [facility] CM/SW Contact:  Maryclare Labrador, RN Phone Number: 03/24/2019, 11:14 AM   Update:  [facility] has gained approval for wound vac and will deliver equipment to pts room tomorrow 3/27  Clinical Narrative:   Attending completed [facility] wound vac form.  CM contacted [facility] liaison and informed of referral;  CSW faxed the following documents:  H&P, facesheet, op note and form faxed to [facility] at [phone].  There is a possibility that pt  will also discharge home with IV antibiotics - pt chose [facility] for home infusion - tentative referral accepted pending orders.      Barriers to Discharge: Continued Medical Work up   Patient Goals and CMS Choice Patient states their goals for this hospitalization and ongoing recovery are:: (To get back home to his wife and not get sick with the virus) CMS Medicare.gov Compare Post Acute Care list provided to:: Patient Choice offered to / list presented to : Patient  Discharge Placement                       Discharge Plan and Services   Discharge Planning Services: CM Consult            DME Arranged: Vac DME Agency: [facility] HH Arranged: RN, Nurse's Aide [place] Agency: [place] ([facility])   Social Determinants of Health (SDOH) Interventions     Readmission Risk Interventions No flowsheet data found.

## 2019-03-24 NOTE — Progress Notes (Signed)
2 Days Post-Op Procedure(s) (LRB): STERNAL WOUND DEBRIDEMENT (N/A) APPLICATION OF WOUND VAC (N/A) Subjective: Still complaining of some parasternal pain. Says it is controlled with the analgesics as prescribed.  Objective: Vital signs in last 24 hours: Temp:  [97.7 F (36.5 C)-98.4 F (36.9 C)] 98.4 F (36.9 C) (03/26 0751) Pulse Rate:  [64-73] 73 (03/26 0322) Cardiac Rhythm: Normal sinus rhythm (03/26 0802) Resp:  [13-17] 13 (03/26 0322) BP: (122-136)/(58-76) 136/75 (03/26 0322) SpO2:  [95 %-98 %] 96 % (03/26 0751) Weight:  [106.7 kg] 106.7 kg (03/26 0644)    Intake/Output from previous day: 03/25 0701 - 03/26 0700 In: 1538.4 [P.O.:300; IV Piggyback:1238.4] Out: 3500 [Urine:3475; Drains:25] Intake/Output this shift: Total I/O In: 360 [P.O.:360] Out: 300 [Urine:300]  General appearance: alert, cooperative and mild distress Heart: regular rate and rhythm Lungs: Breath sounds are clear Extremities: Bilateral 2+ lower extremity edema Wound: Sternal wound VAC sponge is appropriately compressed with no apparent leaks.  There is moderate thin serous drainage in the collection chamber.   Lab Results: Recent Labs    03/23/19 0312 03/24/19 0211  WBC 6.9 8.1  HGB 8.3* 8.3*  HCT 27.0* 27.9*  PLT 423* 425*   BMET:  Recent Labs    03/22/19 0215 03/23/19 0312  NA 134* 137  K 4.0 4.4  CL 101 102  CO2 27 28  GLUCOSE 130* 128*  BUN 13 11  CREATININE 0.79 0.79  CALCIUM 9.3 9.4    PT/INR: No results for input(s): LABPROT, INR in the last 72 hours. ABG    Component Value Date/Time   PHART 7.451 (H) 03/18/2019 0329   HCO3 24.1 03/18/2019 0329   TCO2 25 03/18/2019 0329   ACIDBASEDEF 3.0 (H) 02/24/2019 0155   O2SAT 91.0 03/18/2019 0329   CBG (last 3)  No results for input(s): GLUCAP in the last 72 hours.  Assessment/Plan: S/P Procedure(s) (LRB): STERNAL WOUND DEBRIDEMENT (N/A) APPLICATION OF WOUND VAC (N/A)  -Postop day 2 incision and drainage of superficial  sternal wound.  The wound VAC is functioning appropriately.  Plan to leave in place until Friday.  Wound cultures are are negative to date. AFB smear negative.  Gram stain showed multiple white cells but no organisms were seen.  He is afebrile and leukocytosis has resolved continue with broad-spectrum antibiotic coverage for now.  -OK to proceed with PICC placement.  --Known mild-moderate pericardial effusion post emergency repair of type-I aortic dissection and CABG x1 last month. Plan repeat ECHO prior to discharge.   LOS: 6 days    Antony Odea, PA-C [phone] 03/24/2019

## 2019-03-25 ENCOUNTER — Inpatient Hospital Stay (HOSPITAL_COMMUNITY): Payer: Medicare Other | Admitting: Certified Registered"

## 2019-03-25 ENCOUNTER — Encounter (HOSPITAL_COMMUNITY)
Admission: EM | Disposition: A | Discharge status: Home Health | Payer: Self-pay | Source: Home / Self Care | Attending: Cardiothoracic Surgery

## 2019-03-25 ENCOUNTER — Encounter (HOSPITAL_COMMUNITY): Payer: Self-pay | Admitting: *Deleted

## 2019-03-25 DIAGNOSIS — I9789 Other postprocedural complications and disorders of the circulatory system, not elsewhere classified: Secondary | ICD-10-CM

## 2019-03-25 HISTORY — PX: STERNAL WOUND DEBRIDEMENT: SHX1058

## 2019-03-25 HISTORY — PX: 25 GAUGE PARS PLANA VITRECTOMY WITH 20 GAUGE MVR PORT: SHX6041

## 2019-03-25 LAB — BPAM RBC
Blood Product Expiration Date: 202004172359
Blood Product Expiration Date: 202004172359
ISSUE DATE / TIME: 202003240915
Unit Type and Rh: 5100
Unit Type and Rh: 5100

## 2019-03-25 LAB — CBC
HCT: 29.5 % — ABNORMAL LOW (ref 39.0–52.0)
Hemoglobin: 8.8 g/dL — ABNORMAL LOW (ref 13.0–17.0)
MCH: 26.4 pg (ref 26.0–34.0)
MCHC: 29.8 g/dL — ABNORMAL LOW (ref 30.0–36.0)
MCV: 88.6 fL (ref 80.0–100.0)
Platelets: 456 10*3/uL — ABNORMAL HIGH (ref 150–400)
RBC: 3.33 MIL/uL — ABNORMAL LOW (ref 4.22–5.81)
RDW: 14.9 % (ref 11.5–15.5)
WBC: 9.8 10*3/uL (ref 4.0–10.5)
nRBC: 0 % (ref 0.0–0.2)

## 2019-03-25 LAB — RENAL FUNCTION PANEL
Albumin: 2.7 g/dL — ABNORMAL LOW (ref 3.5–5.0)
Anion gap: 12 (ref 5–15)
BUN: 13 mg/dL (ref 6–20)
CO2: 29 mmol/L (ref 22–32)
Calcium: 9.2 mg/dL (ref 8.9–10.3)
Chloride: 97 mmol/L — ABNORMAL LOW (ref 98–111)
Creatinine, Ser: 0.82 mg/dL (ref 0.61–1.24)
GFR calc Af Amer: >60 mL/min (ref >60)
GFR calc non Af Amer: >60 mL/min (ref >60)
Glucose, Bld: 100 mg/dL — ABNORMAL HIGH (ref 70–99)
Phosphorus: 4.6 mg/dL (ref 2.5–4.6)
Potassium: 3.1 mmol/L — ABNORMAL LOW (ref 3.5–5.1)
Sodium: 138 mmol/L (ref 135–145)

## 2019-03-25 LAB — TYPE AND SCREEN
ABO/RH(D): O POS
Antibody Screen: NEGATIVE
Unit division: 0
Unit division: 0

## 2019-03-25 SURGERY — DEBRIDEMENT, WOUND, STERNUM
Anesthesia: General

## 2019-03-25 MED ORDER — ENSURE MAX PROTEIN PO LIQD
11.0000 [oz_av] | Freq: Two times a day (BID) | ORAL | Status: DC
  Filled 2019-03-25: qty 330

## 2019-03-25 MED ORDER — LIDOCAINE 2% (20 MG/ML) 5 ML SYRINGE
INTRAMUSCULAR | Status: AC
  Filled 2019-03-25: qty 5

## 2019-03-25 MED ORDER — POTASSIUM CHLORIDE CRYS ER 20 MEQ PO TBCR
40.0000 meq | EXTENDED_RELEASE_TABLET | ORAL | Status: AC
  Administered 2019-03-25 (×2): 40 meq via ORAL
  Filled 2019-03-25 (×2): qty 2

## 2019-03-25 MED ORDER — FUROSEMIDE 10 MG/ML IJ SOLN
80.0000 mg | Freq: Two times a day (BID) | INTRAMUSCULAR | Status: DC
  Administered 2019-03-25: 80 mg via INTRAVENOUS
  Filled 2019-03-25: qty 8

## 2019-03-25 MED ORDER — PROPOFOL 10 MG/ML IV BOLUS
INTRAVENOUS | Status: DC | PRN
  Administered 2019-03-25: 140 mg via INTRAVENOUS

## 2019-03-25 MED ORDER — FENTANYL CITRATE (PF) 100 MCG/2ML IJ SOLN
INTRAMUSCULAR | Status: DC | PRN
  Administered 2019-03-25: 50 ug via INTRAVENOUS
  Administered 2019-03-25: 100 ug via INTRAVENOUS

## 2019-03-25 MED ORDER — VANCOMYCIN HCL 1000 MG IV SOLR
INTRAVENOUS | Status: AC
  Administered 2019-03-25: 1000 mL
  Filled 2019-03-25 (×2): qty 1000

## 2019-03-25 MED ORDER — PHENYLEPHRINE 40 MCG/ML (10ML) SYRINGE FOR IV PUSH (FOR BLOOD PRESSURE SUPPORT)
PREFILLED_SYRINGE | INTRAVENOUS | Status: DC | PRN
  Administered 2019-03-25: 80 ug via INTRAVENOUS

## 2019-03-25 MED ORDER — SPIRONOLACTONE 12.5 MG HALF TABLET
12.5000 mg | ORAL_TABLET | Freq: Every day | ORAL | Status: DC
  Administered 2019-03-26 – 2019-04-07 (×11): 12.5 mg via ORAL
  Filled 2019-03-25 (×11): qty 1

## 2019-03-25 MED ORDER — POTASSIUM CHLORIDE CRYS ER 10 MEQ PO TBCR
20.0000 meq | EXTENDED_RELEASE_TABLET | Freq: Two times a day (BID) | ORAL | Status: DC
  Administered 2019-03-25 – 2019-04-02 (×15): 20 meq via ORAL
  Filled 2019-03-25 (×17): qty 2

## 2019-03-25 MED ORDER — FENTANYL CITRATE (PF) 100 MCG/2ML IJ SOLN: 25.0000 ug | INTRAMUSCULAR | Status: DC | PRN

## 2019-03-25 MED ORDER — MIDAZOLAM HCL 2 MG/2ML IJ SOLN
INTRAMUSCULAR | Status: AC
  Filled 2019-03-25: qty 2

## 2019-03-25 MED ORDER — DEXAMETHASONE SODIUM PHOSPHATE 10 MG/ML IJ SOLN
INTRAMUSCULAR | Status: DC | PRN
  Administered 2019-03-25: 5 mg via INTRAVENOUS

## 2019-03-25 MED ORDER — LIDOCAINE 2% (20 MG/ML) 5 ML SYRINGE
INTRAMUSCULAR | Status: DC | PRN
  Administered 2019-03-25: 40 mg via INTRAVENOUS

## 2019-03-25 MED ORDER — SODIUM CHLORIDE 0.9 % IR SOLN
Status: DC | PRN
  Administered 2019-03-25: 2000 mL
  Administered 2019-03-25: 1000 mL

## 2019-03-25 MED ORDER — ROCURONIUM BROMIDE 50 MG/5ML IV SOSY
PREFILLED_SYRINGE | INTRAVENOUS | Status: AC
  Filled 2019-03-25: qty 5

## 2019-03-25 MED ORDER — PROPOFOL 10 MG/ML IV BOLUS
INTRAVENOUS | Status: AC
  Filled 2019-03-25: qty 20

## 2019-03-25 MED ORDER — CIPROFLOXACIN IN D5W 400 MG/200ML IV SOLN
400.0000 mg | Freq: Two times a day (BID) | INTRAVENOUS | Status: DC
  Administered 2019-03-25 – 2019-03-28 (×6): 400 mg via INTRAVENOUS
  Filled 2019-03-25 (×6): qty 200

## 2019-03-25 MED ORDER — LACTATED RINGERS IV SOLN: INTRAVENOUS | Status: DC

## 2019-03-25 MED ORDER — SUCCINYLCHOLINE CHLORIDE 20 MG/ML IJ SOLN
INTRAMUSCULAR | Status: DC | PRN
  Administered 2019-03-25: 100 mg via INTRAVENOUS

## 2019-03-25 MED ORDER — ENOXAPARIN SODIUM 40 MG/0.4ML ~~LOC~~ SOLN
40.0000 mg | SUBCUTANEOUS | Status: DC
  Administered 2019-03-26 – 2019-03-28 (×3): 40 mg via SUBCUTANEOUS
  Filled 2019-03-25 (×2): qty 0.4

## 2019-03-25 MED ORDER — MIDAZOLAM HCL 2 MG/2ML IJ SOLN
INTRAMUSCULAR | Status: DC | PRN
  Administered 2019-03-25: 2 mg via INTRAVENOUS

## 2019-03-25 MED ORDER — FENTANYL CITRATE (PF) 250 MCG/5ML IJ SOLN
INTRAMUSCULAR | Status: AC
  Filled 2019-03-25: qty 5

## 2019-03-25 MED ORDER — ONDANSETRON HCL 4 MG/2ML IJ SOLN
INTRAMUSCULAR | Status: AC
  Filled 2019-03-25: qty 2

## 2019-03-25 MED ORDER — ASPIRIN EC 81 MG PO TBEC
81.0000 mg | DELAYED_RELEASE_TABLET | Freq: Every day | ORAL | Status: DC
  Administered 2019-03-25 – 2019-04-07 (×12): 81 mg via ORAL
  Filled 2019-03-25 (×12): qty 1

## 2019-03-25 SURGICAL SUPPLY — 68 items
ATTRACTOMAT 16X20 MAGNETIC DRP (DRAPES) ×3 IMPLANT
BAG DECANTER FOR FLEXI CONT (MISCELLANEOUS) ×3 IMPLANT
BENZOIN TINCTURE PRP APPL 2/3 (GAUZE/BANDAGES/DRESSINGS) IMPLANT
BLADE SURG 10 STRL SS (BLADE) ×3 IMPLANT
BLADE SURG 15 STRL LF DISP TIS (BLADE) IMPLANT
BLADE SURG 15 STRL SS (BLADE)
BNDG GAUZE ELAST 4 BULKY (GAUZE/BANDAGES/DRESSINGS) IMPLANT
BRUSH SCRUB EZ PLAIN DRY (MISCELLANEOUS) ×9 IMPLANT
CANISTER SUCT 3000ML PPV (MISCELLANEOUS) ×3 IMPLANT
CATH FOLEY 2WAY SLVR  5CC 16FR (CATHETERS)
CATH FOLEY 2WAY SLVR 5CC 16FR (CATHETERS) IMPLANT
CATH THORACIC 28FR RT ANG (CATHETERS) IMPLANT
CATH THORACIC 36FR (CATHETERS) IMPLANT
CATH THORACIC 36FR RT ANG (CATHETERS) IMPLANT
CLIP VESOCCLUDE SM WIDE 24/CT (CLIP) IMPLANT
CONN Y 3/8X3/8X3/8  BEN (MISCELLANEOUS)
CONN Y 3/8X3/8X3/8 BEN (MISCELLANEOUS) IMPLANT
CONT SPEC 4OZ CLIKSEAL STRL BL (MISCELLANEOUS) IMPLANT
COVER SURGICAL LIGHT HANDLE (MISCELLANEOUS) IMPLANT
COVER WAND RF STERILE (DRAPES) ×3 IMPLANT
DRAPE LAPAROSCOPIC ABDOMINAL (DRAPES) ×3 IMPLANT
DRAPE SLUSH/WARMER DISC (DRAPES) IMPLANT
DRAPE WARM FLUID 44X44 (DRAPE) IMPLANT
DRSG AQUACEL AG ADV 3.5X14 (GAUZE/BANDAGES/DRESSINGS) IMPLANT
DRSG PAD ABDOMINAL 8X10 ST (GAUZE/BANDAGES/DRESSINGS) IMPLANT
DRSG VAC ATS LRG SENSATRAC (GAUZE/BANDAGES/DRESSINGS) ×3 IMPLANT
DRSG VAC ATS MED SENSATRAC (GAUZE/BANDAGES/DRESSINGS) ×3 IMPLANT
DRSG VAC ATS SM SENSATRAC (GAUZE/BANDAGES/DRESSINGS) IMPLANT
ELECT BLADE 4.0 EZ CLEAN MEGAD (MISCELLANEOUS) ×3
ELECT REM PT RETURN 9FT ADLT (ELECTROSURGICAL) ×3
ELECTRODE BLDE 4.0 EZ CLN MEGD (MISCELLANEOUS) ×1 IMPLANT
ELECTRODE REM PT RTRN 9FT ADLT (ELECTROSURGICAL) ×1 IMPLANT
GAUZE SPONGE 4X4 12PLY STRL (GAUZE/BANDAGES/DRESSINGS) ×3 IMPLANT
GAUZE XEROFORM 5X9 LF (GAUZE/BANDAGES/DRESSINGS) IMPLANT
GLOVE BIO SURGEON STRL SZ7.5 (GLOVE) ×6 IMPLANT
GOWN STRL REUS W/ TWL LRG LVL3 (GOWN DISPOSABLE) ×4 IMPLANT
GOWN STRL REUS W/TWL LRG LVL3 (GOWN DISPOSABLE) ×8
HANDPIECE INTERPULSE COAX TIP (DISPOSABLE)
HEMOSTAT POWDER SURGIFOAM 1G (HEMOSTASIS) IMPLANT
HEMOSTAT SURGICEL 2X14 (HEMOSTASIS) IMPLANT
KIT BASIN OR (CUSTOM PROCEDURE TRAY) ×3 IMPLANT
KIT SUCTION CATH 14FR (SUCTIONS) IMPLANT
KIT TURNOVER KIT B (KITS) ×3 IMPLANT
NS IRRIG 1000ML POUR BTL (IV SOLUTION) ×9 IMPLANT
PACK CHEST (CUSTOM PROCEDURE TRAY) ×3 IMPLANT
PACK GENERAL/GYN (CUSTOM PROCEDURE TRAY) ×3 IMPLANT
PAD ARMBOARD 7.5X6 YLW CONV (MISCELLANEOUS) ×6 IMPLANT
SET HNDPC FAN SPRY TIP SCT (DISPOSABLE) IMPLANT
SOL PREP POV-IOD 4OZ 10% (MISCELLANEOUS) ×3 IMPLANT
SPONGE LAP 18X18 RF (DISPOSABLE) ×3 IMPLANT
STAPLER VISISTAT 35W (STAPLE) IMPLANT
STRAP MONTGOMERY 1.25X11-1/8 (MISCELLANEOUS) IMPLANT
SUT ETHILON 3 0 FSL (SUTURE) IMPLANT
SUT STEEL 6MS V (SUTURE) IMPLANT
SUT STEEL STERNAL CCS#1 18IN (SUTURE) IMPLANT
SUT STEEL SZ 6 DBL 3X14 BALL (SUTURE) IMPLANT
SUT VIC AB 1 CTX 36 (SUTURE)
SUT VIC AB 1 CTX36XBRD ANBCTR (SUTURE) IMPLANT
SUT VIC AB 2-0 CTX 27 (SUTURE) IMPLANT
SUT VIC AB 3-0 X1 27 (SUTURE) IMPLANT
SWAB COLLECTION DEVICE MRSA (MISCELLANEOUS) IMPLANT
SWAB CULTURE ESWAB REG 1ML (MISCELLANEOUS) IMPLANT
SYR 5ML LL (SYRINGE) IMPLANT
TOWEL GREEN STERILE (TOWEL DISPOSABLE) ×3 IMPLANT
TOWEL GREEN STERILE FF (TOWEL DISPOSABLE) ×3 IMPLANT
TRAY FOLEY MTR SLVR 16FR STAT (SET/KITS/TRAYS/PACK) IMPLANT
WATER STERILE IRR 1000ML POUR (IV SOLUTION) ×3 IMPLANT
WND VAC CANISTER 500ML (MISCELLANEOUS) ×3 IMPLANT

## 2019-03-25 NOTE — Anesthesia Postprocedure Evaluation (Signed)
Anesthesia Post Note  Patient: [name] [name]  Procedure(s) Performed: SUPERFICIAL STERNAL WOUND DEBRIDEMENT (N/A ) WOUND VAC CHANGE AND IRRIGATION (N/A )     Patient location during evaluation: PACU Anesthesia Type: General Level of consciousness: awake and alert Pain management: pain level controlled Vital Signs Assessment: post-procedure vital signs reviewed and stable Respiratory status: spontaneous breathing, nonlabored ventilation, respiratory function stable and patient connected to nasal cannula oxygen Cardiovascular status: blood pressure returned to baseline and stable Postop Assessment: no apparent nausea or vomiting Anesthetic complications: no    Last Vitals:  Vitals:   03/25/19 1530 03/25/19 1558  BP:  132/65  Pulse: 75 83  Resp: 19 (!) 22  Temp:  37.1 C  SpO2: 100% 92%    Last Pain:  Vitals:   03/25/19 1558  TempSrc: Oral  PainSc:                  [name]

## 2019-03-25 NOTE — Brief Op Note (Signed)
03/25/2019  3:11 PM  PATIENT:  [name] [name]  58 y.o. male  PRE-OPERATIVE DIAGNOSIS: SUPERFICIAL STERNAL WOUND INFECTION  POST-OPERATIVE DIAGNOSIS:  SUPERFICIAL STERNAL WOUND INFECTION  PROCEDURE:  Procedure(s): SUPERFICIAL STERNAL WOUND DEBRIDEMENT (N/A) WOUND VAC CHANGE AND IRRIGATION (N/A)  SURGEON:  Surgeon(s) and Role:    Ivin Poot, MD - Primary  PHYSICIAN ASSISTANT:   ASSISTANTS: none   ANESTHESIA:   general  EBL:  20 mL   BLOOD ADMINISTERED:none  DRAINS: none   LOCAL MEDICATIONS USED:  NONE  SPECIMEN:  No Specimen  DISPOSITION OF SPECIMEN:  N/A  COUNTS:  YES  TOURNIQUET:  * No tourniquets in log *  DICTATION: .Dragon Dictation  PLAN OF CARE: return to 2 C  PATIENT DISPOSITION:  PACU - hemodynamically stable.   Delay start of Pharmacological VTE agent (>24hrs) due to surgical blood loss or risk of bleeding: yes

## 2019-03-25 NOTE — Anesthesia Procedure Notes (Signed)
Procedure Name: Intubation Date/Time: 03/25/2019 1:51 PM Performed by: Oletta Lamas, CRNA Pre-anesthesia Checklist: Patient identified, Emergency Drugs available, Suction available and Patient being monitored Patient Re-evaluated:Patient Re-evaluated prior to induction Oxygen Delivery Method: Circle System Utilized Preoxygenation: Pre-oxygenation with 100% oxygen Induction Type: IV induction Ventilation: Mask ventilation without difficulty Laryngoscope Size: 2 and [name] Grade View: Grade I Tube type: Oral Tube size: 7.5 mm Number of attempts: 1 Airway Equipment and Method: Stylet Placement Confirmation: ETT inserted through vocal cords under direct vision,  positive ETCO2 and breath sounds checked- equal and bilateral Secured at: 22 cm Tube secured with: Tape Dental Injury: Teeth and Oropharynx as per pre-operative assessment

## 2019-03-25 NOTE — Progress Notes (Signed)
Initial Nutrition Assessment  RD working remotely.  DOCUMENTATION CODES:   Obesity unspecified  INTERVENTION:   Add Ensure Max BID (each provides 150 kcal, 30 g protein) when diet advances  NUTRITION DIAGNOSIS:   Increased nutrient needs related to wound healing as evidenced by estimated needs.  GOAL:   Patient will meet greater than or equal to 90% of their needs  MONITOR:   PO intake  REASON FOR ASSESSMENT:   Other (Comment)(documented wound)    ASSESSMENT:    57 yo male, admitted with chest pain, SOB. PMH significant for COPD, PAF, CAD, HTN, HLD, GERD, h/o colonic polyps, h/o MI, PVD, pre-diabetes, NASH. Recent admission for [facility] with PCI to 81% RCA lesion complicated by repair of Type A dissection with single vessel RCA CABG (with SVG); discharged home.  Labs: potassium 3.1 (L) and chloride 97 (L) - replacing, glucose 100 mg/dL Meds: Lipitor, Trinsicon/Foltrim, Lasix, Protonix EC, potassium chloride  Unable to reach pt by phone. Will order Ensure Max BID to support wound healing starting 3/28.  NUTRITION - FOCUSED PHYSICAL EXAM: Deferred - RD working remotely  Diet Order Hx  3/20: NPO, full liquid 3/24: NPO, Heart healthy/CHO mod, 1500 mL Diet Order            Diet NPO time specified Except for: Sips with Meds  Diet effective midnight            NPO for sternal wound debridement and wound vac application   EDUCATION NEEDS:  No education needs have been identified at this time  Skin:  Skin Assessment: Skin Integrity Issues: Skin Integrity Issues:: Incisions, Wound VAC, Other (Comment) Wound Vac: sternal abcess Incisions: chest Other: ecchymosis - L chest, R leg; skin tear - R upper back  Last BM:  3/26  Height:  Ht Readings from Last 1 Encounters:  03/25/19 5' 8"  (1.727 m)    Weight:  Wt Readings from Last 10 Encounters:  03/25/19 103.2 kg  03/05/19 104.7 kg  11/23/18 99.8 kg  11/22/18 99.8 kg  Wt up x 4 months Net -5.2 L fluid since  admit, diuresing   Ideal Body Weight:  70 kg  BMI:  Body mass index is 34.59 kg/m. obese class 2, ?accuracy given fluid status  Estimated Nutritional Needs: calculated based on IBW (70 kg)  Kcal:  2100-2450 (30-35 kcal/kg)  Protein:  105-126 (1.5-1.8 g/kg)  Fluid:  >/= 1.5 L daily or per MD  Althea Grimmer, MS, RDN, LDN Pager: [phone] Available Mondays and Fridays, 9am-2pm

## 2019-03-25 NOTE — Op Note (Signed)
NAME: [name] [name], [name] [name] MEDICAL RECORD SK:[id number] ACCOUNT [id number] DATE OF BIRTH:1961/12/28 FACILITY: [facility] LOCATION: [facility] PHYSICIAN:Daleena Rotter VAN TRIGT III, MD  OPERATIVE REPORT  DATE OF PROCEDURE:  03/25/2019  OPERATION:  Sternal wound VAC change with irrigation of wound.  SURGEON:  Ivin Poot III, MD  ANESTHESIA:  General.  PREOPERATIVE DIAGNOSES: 1.  Superficial sternal wound infection after emergency sternotomy for repair of aortic dissection and coronary artery bypass graft x1 to the right coronary artery. 2.  Obesity. 3.  Poorly controlled diabetes.  POSTOPERATIVE DIAGNOSES: 1.  Superficial sternal wound infection after emergency sternotomy for repair of aortic dissection and coronary artery bypass graft x1 to the right coronary artery. 2.  Obesity. 3.  Poorly controlled diabetes.  DESCRIPTION OF PROCEDURE:  The patient was brought from preop holding where informed consent was documented and final questions addressed with the patient.  The patient was brought to the operating room and placed supine on the operating table and  general anesthesia was induced.  The patient remained stable.  The previously placed wound VAC dressing and sponge were removed.  The patient was prepped and draped as a sterile field.  A proper time-out was performed.  The wound was examined.  The incision was very deep because of the large amount of subcutaneous fat.  Subcutaneous fat had areas of fibrinous exudate and nonviable fat.  These were sharply excised using electrocautery.  Some of the sternal wires are now  visible.  There was no visible cardiac structure.  The wires appear tightened.  After the debridement of the subcutaneous fat, the wound was irrigated with copious amounts of vancomycin irrigation as well as saline.  Hemostasis was achieved.  A wound VAC  was then placed after the sponge was cut to the appropriate size and length.  A small piece of sponge was placed at the superior  aspect of the wound that went to a space superior to the skin edge.  Over this, a VAC sponge and the sterile dressing were  applied, an opening was created, the suction line connected, and the sponge collapsed well.  Blood loss was minimal.  The patient was reversed from anesthesia and returned back to recovery room in stable condition.  LN/NUANCE  D:03/25/2019 T:03/25/2019 JOB:[id number]

## 2019-03-25 NOTE — Anesthesia Preprocedure Evaluation (Signed)
Anesthesia Evaluation  Patient identified by MRN, date of birth, ID band Patient awake    Reviewed: Allergy & Precautions, NPO status , Patient's Chart, lab work & pertinent test results  History of Anesthesia Complications Negative for: history of anesthetic complications  Airway Mallampati: II  TM Distance: >3 FB Neck ROM: Full    Dental  (+) Edentulous Upper, Missing,    Pulmonary asthma , COPD, former smoker,    breath sounds clear to auscultation       Cardiovascular hypertension, Pt. on medications and Pt. on home beta blockers + CAD, + Past MI and + Peripheral Vascular Disease   Rhythm:Regular     Neuro/Psych TIACVA    GI/Hepatic GERD  Medicated and Controlled,(+) Hepatitis -  Endo/Other  negative endocrine ROS  Renal/GU negative Renal ROS     Musculoskeletal  (+) Arthritis ,   Abdominal   Peds  Hematology   Anesthesia Other Findings   Reproductive/Obstetrics                             Lab Results  Component Value Date   WBC 9.8 03/25/2019   HGB 8.8 (L) 03/25/2019   HCT 29.5 (L) 03/25/2019   MCV 88.6 03/25/2019   PLT 456 (H) 03/25/2019   Lab Results  Component Value Date   CREATININE 0.82 03/25/2019   BUN 13 03/25/2019   NA 138 03/25/2019   K 3.1 (L) 03/25/2019   CL 97 (L) 03/25/2019   CO2 29 03/25/2019    Anesthesia Physical  Anesthesia Plan  ASA: IV  Anesthesia Plan: General   Post-op Pain Management:    Induction: Intravenous  PONV Risk Score and Plan: 2 and Ondansetron and Dexamethasone  Airway Management Planned: Oral ETT  Additional Equipment: None  Intra-op Plan:   Post-operative Plan: Extubation in OR  Informed Consent: I have reviewed the patients History and Physical, chart, labs and discussed the procedure including the risks, benefits and alternatives for the proposed anesthesia with the patient or authorized representative who has  indicated his/her understanding and acceptance.     Dental advisory given  Plan Discussed with: CRNA and Surgeon  Anesthesia Plan Comments:         Anesthesia Quick Evaluation

## 2019-03-25 NOTE — Transfer of Care (Signed)
Immediate Anesthesia Transfer of Care Note  Patient: [name] [name]  Procedure(s) Performed: SUPERFICIAL STERNAL WOUND DEBRIDEMENT (N/A ) WOUND VAC CHANGE AND IRRIGATION (N/A )  Patient Location: PACU  Anesthesia Type:General  Level of Consciousness: awake, alert , oriented and patient cooperative  Airway & Oxygen Therapy: Patient Spontanous Breathing and Patient connected to face mask oxygen  Post-op Assessment: Report given to RN and Post -op Vital signs reviewed and stable  Post vital signs: Reviewed and stable  Last Vitals:  Vitals Value Taken Time  BP 144/79 03/25/2019  2:55 PM  Temp    Pulse 80 03/25/2019  2:55 PM  Resp 16 03/25/2019  2:55 PM  SpO2 89 % 03/25/2019  2:55 PM  Vitals shown include unvalidated device data.  Last Pain:  Vitals:   03/25/19 0800  TempSrc: Oral  PainSc: 4       Patients Stated Pain Goal: 3 (39/79/53 6922)  Complications: No apparent anesthesia complications

## 2019-03-25 NOTE — Progress Notes (Signed)
Pre Procedure note for inpatients:   [name] [name] has been scheduled for Procedure(s): STERNAL WOUND DEBRIDEMENT (N/A) APPLICATION OF WOUND VAC (N/A) today. The various methods of treatment have been discussed with the patient. After consideration of the risks, benefits and treatment options the patient has consented to the planned procedure.   The patient has been seen and labs reviewed. There are no changes in the patient's condition to prevent proceeding with the planned procedure today.  Recent labs:  Lab Results  Component Value Date   WBC 9.8 03/25/2019   HGB 8.8 (L) 03/25/2019   HCT 29.5 (L) 03/25/2019   PLT 456 (H) 03/25/2019   GLUCOSE 100 (H) 03/25/2019   CHOL 156 02/23/2019   TRIG 101 02/23/2019   HDL 31 (L) 02/23/2019   LDLCALC 105 (H) 02/23/2019   ALT 29 03/18/2019   AST 19 03/18/2019   NA 138 03/25/2019   K 3.1 (L) 03/25/2019   CL 97 (L) 03/25/2019   CREATININE 0.82 03/25/2019   BUN 13 03/25/2019   CO2 29 03/25/2019   TSH 4.862 (H) 02/27/2019   INR 1.2 03/21/2019   HGBA1C 6.0 (H) 02/23/2019    Len Childs, MD 03/25/2019 7:37 AM

## 2019-03-25 NOTE — Progress Notes (Addendum)
Progress Note  Patient Name: [name] [name] Date of Encounter: 03/25/2019  Primary Cardiologist: Carlyle Dolly, MD was Dr. Mare Ferrari   Subjective   Good diuresis with IV lasix yesterday. Weight down 8 pounds. Breathing better. No orthopnea or PND. Remains in NSR. No fevers or chills. For surgery today for wound vac change  Inpatient Medications    Scheduled Meds:  sodium chloride   Intravenous Once   allopurinol  100 mg Oral Daily   amiodarone  200 mg Oral BID   atorvastatin  80 mg Oral q1800   carvedilol  25 mg Oral BID WC   colchicine  0.6 mg Oral Daily   ferrous [id number] C-folic acid  1 capsule Oral TID PC   ipratropium-albuterol  3 mL Nebulization BID   losartan  100 mg Oral Daily   pantoprazole  80 mg Oral BID   sodium chloride flush  10-40 mL Intracatheter Q12H   torsemide  20 mg Oral BID   traZODone  100 mg Oral QHS   Continuous Infusions:  aztreonam 2 g (03/25/19 0510)   lactated ringers 10 mL/hr at 03/22/19 0724   vancomycin Stopped (03/24/19 2142)   PRN Meds: acetaminophen **OR** acetaminophen, albuterol, HYDROmorphone (DILAUDID) injection, morphine injection, ondansetron **OR** ondansetron (ZOFRAN) IV, oxyCODONE-acetaminophen, sodium chloride flush   Vital Signs    Vitals:   03/24/19 1921 03/24/19 1956 03/25/19 0013 03/25/19 0450  BP:  139/72 124/82 (!) 142/80  Pulse:  82    Resp:  20 15 17   Temp:  98.5 F (36.9 C) 99.2 F (37.3 C) 98.8 F (37.1 C)  TempSrc:  Oral Oral Oral  SpO2: 94% 98%    Weight:    103.2 kg  Height:        Intake/Output Summary (Last 24 hours) at 03/25/2019 2122 Last data filed at 03/25/2019 4825 Gross per 24 hour  Intake 2230.03 ml  Output 3975 ml  Net -1744.97 ml   Last 3 Weights 03/25/2019 03/24/2019 03/23/2019  Weight (lbs) 227 lb 8.2 oz 235 lb 3.7 oz 232 lb 5.8 oz  Weight (kg) 103.2 kg 106.7 kg 105.4 kg      Telemetry    NSR 80s Personally reviewed  ECG    No new - Personally  Reviewed  Physical Exam   General:  Sitting in chair No resp difficulty HEENT: normal Neck: supple. JVP 8-9 Carotids 2+ bilat; no bruits. No lymphadenopathy or thryomegaly appreciated. Cor: + wound vac PMI nondisplaced. Regular rate & rhythm. No rubs, gallops or murmurs. Lungs: clear Abdomen: soft, nontender, nondistended. No hepatosplenomegaly. No bruits or masses. Good bowel sounds. Extremities: no cyanosis, clubbing, rash, 2+ edema Neuro: alert & orientedx3, cranial nerves grossly intact. moves all 4 extremities w/o difficulty. Affect pleasant   Labs    Chemistry Recent Labs  Lab 03/22/19 0215 03/23/19 0312 03/25/19 0500  NA 134* 137 138  K 4.0 4.4 3.1*  CL 101 102 97*  CO2 27 28 29   GLUCOSE 130* 128* 100*  BUN 13 11 13   CREATININE 0.79 0.79 0.82  CALCIUM 9.3 9.4 9.2  ALBUMIN  --   --  2.7*  GFRNONAA >60 >60 >60  GFRAA >60 >60 >60  ANIONGAP 6 7 12      Hematology Recent Labs  Lab 03/23/19 0312 03/24/19 0211 03/25/19 0500  WBC 6.9 8.1 9.8  RBC 3.07* 3.14* 3.33*  HGB 8.3* 8.3* 8.8*  HCT 27.0* 27.9* 29.5*  MCV 87.9 88.9 88.6  MCH 27.0 26.4 26.4  MCHC 30.7 29.7*  29.8*  RDW 14.5 14.6 14.9  PLT 423* 425* 456*    Cardiac EnzymesNo results for input(s): TROPONINI in the last 168 hours. No results for input(s): TROPIPOC in the last 168 hours.   BNPNo results for input(s): BNP, PROBNP in the last 168 hours.   DDimer No results for input(s): DDIMER in the last 168 hours.   Radiology    No results found.  Cardiac Studies   Limited Echo 03/21/19 IMPRESSIONS    1. Moderate pericardial effusion primarily located peri-apically. There is > 25% respirophasic variation in the mitral E inflow velocity. Septal bounce is not marked. The IVC is dilated to 2.7 cm but with normal respirophasic variation. 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 If patient is symptomatic, would consider hemodynamic  cath.  2. Mild calcification of the aortic valve. The aortic valve was not assessed by doppler.  3. The interatrial septum was not assessed.  4. No evidence of mitral valve stenosis. No mitral regurgitation.  5. Normal RV size with mildly decreased systolic function.  6. The left ventricle had a visually estimated ejection fraction of of 55%. The cavity size was normal. indeterminate diastolic function. Images inadequate for wall motion analysis.  FINDINGS  Left Ventricle: The left ventricle has a visually estimated ejection fraction of of 55%. The cavity size was normal. There is no increase in left ventricular wall thickness. Left ventricular diastolic Doppler parameters are consistent with indeterminate  diastolic dysfunction. Right Ventricle: The right ventricle has mildly reduced systolic function. The cavity was normal. There is no increase in right ventricular wall thickness. Left Atrium: Left atrial size was normal in size. Right Atrium: Right atrial size was normal in size. Right atrial pressure is estimated at 8 mmHg. Interatrial Septum: The interatrial septum was not assessed. Pericardium: Moderate pericardial effusion primarily located peri-apically. Mitral Valve: The mitral valve is normal in structure. Mitral valve regurgitation is not visualized by color flow Doppler. No evidence of mitral valve stenosis. Aortic Valve: Mild calcification of the aortic valve. Not assessed by doppler. Venous: The inferior vena cava is dilated in size with greater than 50% respiratory variability.   LEFT VENTRICLE PLAX 2D LV EF:         55 %  RIGHT ATRIUM RA Pressure: 8 mmHg   Echocardiogram 03/18/2019: 1. Respiratory related leftward septal shift with septal shudder. Respiratory variation in mitral inflow. Dilated IVC with blunted respiratory collapse adjacent to RA. No definite RV diastolic collapse, no signifcant RA collapse, howevere suboptimal  image quality in subcostal window.  Findings suggests effusive constrictive physiology, with possible early tamponade. BP and HR reviewed as reported in study. 2. The left ventricle has normal systolic function with an ejection fraction of 60-65%. The cavity size was normal. Left ventricular diastolic function could not be evaluated. 3. The right ventricle has normal systolic function. The cavity was normal. There is no increase in right ventricular wall thickness. 4. Small pericardial effusion. 5. The pericardial effusion is circumferential. 6. The inferior vena cava was dilated in size with <50% respiratory variability. 7. The aortic valve was not well visualized Aortic valve regurgitation was not assessed by color flow Doppler. _______________   Patient Profile     58 y.o. 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 Cardiology was consulted for assistance with antiplatelets and anticoagulation  Assessment & Plan    Sepsis secondary to sternal abscess  --had I&D and wound VAC 03/22/19 --per CT surgery  CAD --cardiac cath 02/23/19 with 90% stenosis of the proximal RCA. He underwent PCI with DES x2 to the RCA; however, procedure was complicated by iatrogenic aortic root dissection likely due to guide catheter trauma. CT surgery was consulted and patient was taken to the OR emergently for repair of acute type A ascending thoracic aortic dissection and single vessel CABG (SVG to RCA). --on ASA 325 and hold plavix until course of debridement is complete --on BB and high-intensity statin    Post op A fib --post op with aortic dissection repair and single vessel CABG did have PAF treated with IV amio , has not had recurrence.   --ST now on amiodarone 200  mg BID  --hold chronic anticoagulation for now and may resume post debridement.   Moderate Pericardiac Effusion - Small pericardial effusion on TEE last month now Echo with small circumferential pericardial effusion  -CT surgery started Colchicine for concern of pericarditis. Agree with management. No indication to tap currently -Does have some evidence of R-sided volume overload. Wil ldiurese as tolerate   HTN -- today 142/80 to 124/82  --continue current med    For questions or updates, please contact [place] Please consult [email or web address] for contact info under     Signed, Cecilie Kicks, NP  03/25/2019, 7:02 AM     Agree with above.   Patient had single episode post-op AF treated successfully with IV amio during his initial hospitaization. No AC started given risk of bleeding after surgical repair of Ao dissection. AF has not recurred to date as far as we know. Thus would not start AC at this time. Continue oral amio.   Ideally could benefit from ASA/Plavix post-CABG but with need for wound vac changes would not start currently. Can consider as outpatient. No s/s of cardiac ischemia currently.  Has small to moderate pericardial effusion with evidence of R-sided volume overload but no overt tamponade. Agree with colchicine.  Good response to IV lasix. Remains overloaded. Continue IV diuresis at least one more day. So far diuresis not limited but constrictive physiology.   He is hypokalemic. Will supp & add spiro. Discussed dosing with PharmD personally.  To OR today for wound vac change.   We will follow.   Glori Bickers, MD  10:47 AM

## 2019-03-26 ENCOUNTER — Inpatient Hospital Stay (HOSPITAL_COMMUNITY): Payer: Medicare Other

## 2019-03-26 ENCOUNTER — Encounter (HOSPITAL_COMMUNITY): Payer: Self-pay | Admitting: Cardiothoracic Surgery

## 2019-03-26 LAB — BASIC METABOLIC PANEL
Anion gap: 10 (ref 5–15)
BUN: 16 mg/dL (ref 6–20)
CO2: 28 mmol/L (ref 22–32)
Calcium: 8.8 mg/dL — ABNORMAL LOW (ref 8.9–10.3)
Chloride: 100 mmol/L (ref 98–111)
Creatinine, Ser: 0.78 mg/dL (ref 0.61–1.24)
GFR calc Af Amer: >60 mL/min (ref >60)
GFR calc non Af Amer: >60 mL/min (ref >60)
Glucose, Bld: 169 mg/dL — ABNORMAL HIGH (ref 70–99)
Potassium: 3.7 mmol/L (ref 3.5–5.1)
Sodium: 138 mmol/L (ref 135–145)

## 2019-03-26 LAB — CBC
HCT: 26.4 % — ABNORMAL LOW (ref 39.0–52.0)
Hemoglobin: 8.1 g/dL — ABNORMAL LOW (ref 13.0–17.0)
MCH: 27.3 pg (ref 26.0–34.0)
MCHC: 30.7 g/dL (ref 30.0–36.0)
MCV: 88.9 fL (ref 80.0–100.0)
Platelets: 391 10*3/uL (ref 150–400)
RBC: 2.97 MIL/uL — ABNORMAL LOW (ref 4.22–5.81)
RDW: 15.1 % (ref 11.5–15.5)
WBC: 9.3 10*3/uL (ref 4.0–10.5)
nRBC: 0 % (ref 0.0–0.2)

## 2019-03-26 MED ORDER — TORSEMIDE 20 MG PO TABS
40.0000 mg | ORAL_TABLET | Freq: Every day | ORAL | Status: DC
  Administered 2019-03-26 – 2019-03-31 (×5): 40 mg via ORAL
  Filled 2019-03-26 (×5): qty 2

## 2019-03-26 MED ORDER — JUVEN PO PACK
1.0000 | PACK | Freq: Two times a day (BID) | ORAL | Status: DC
  Administered 2019-03-26 – 2019-04-07 (×23): 1 via ORAL
  Filled 2019-03-26 (×25): qty 1

## 2019-03-26 NOTE — Progress Notes (Addendum)
      [address]       [place] [address]             [phone]      1 Day Post-Op Procedure(s) (LRB): SUPERFICIAL STERNAL WOUND DEBRIDEMENT (N/A) WOUND VAC CHANGE AND IRRIGATION (N/A) Subjective: Has some pain with movement but the pain medicine controls it.   Objective: Vital signs in last 24 hours: Temp:  [98.3 F (36.8 C)-99 F (37.2 C)] 98.4 F (36.9 C) (03/28 0734) Pulse Rate:  [71-91] 71 (03/28 0734) Cardiac Rhythm: Normal sinus rhythm;Bundle branch block (03/28 0828) Resp:  [14-22] 15 (03/28 0828) BP: (121-144)/(62-79) 133/65 (03/28 0734) SpO2:  [92 %-100 %] 96 % (03/28 0828) Weight:  [101.8 kg-103.2 kg] 101.8 kg (03/28 0334)     Intake/Output from previous day: 03/27 0701 - 03/28 0700 In: 950 [P.O.:840; I.V.:10; IV Piggyback:100] Out: 6468 [Urine:3501; Drains:77; Stool:1; Blood:20] Intake/Output this shift: Total I/O In: 180 [P.O.:180] Out: 400 [Urine:400]  General appearance: alert, cooperative and no distress Heart: regular rate and rhythm, S1, S2 normal, no murmur, click, rub or gallop Lungs: CTA bilaterally Abdomen: soft, non-tender; bowel sounds normal; no masses,  no organomegaly Extremities: 1-2+ pitting edema in bilateral lower extremity Wound: wound vac with good suction  Lab Results: Recent Labs    03/25/19 0500 03/26/19 0329  WBC 9.8 9.3  HGB 8.8* 8.1*  HCT 29.5* 26.4*  PLT 456* 391   BMET:  Recent Labs    03/25/19 0500 03/26/19 0329  NA 138 138  K 3.1* 3.7  CL 97* 100  CO2 29 28  GLUCOSE 100* 169*  BUN 13 16  CREATININE 0.82 0.78  CALCIUM 9.2 8.8*    PT/INR: No results for input(s): LABPROT, INR in the last 72 hours. ABG    Component Value Date/Time   PHART 7.451 (H) 03/18/2019 0329   HCO3 24.1 03/18/2019 0329   TCO2 25 03/18/2019 0329   ACIDBASEDEF 3.0 (H) 02/24/2019 0155   O2SAT 91.0 03/18/2019 0329   CBG (last 3)  No results for input(s): GLUCAP in the last 72 hours.  Assessment/Plan: S/P  Procedure(s) (LRB): SUPERFICIAL STERNAL WOUND DEBRIDEMENT (N/A) WOUND VAC CHANGE AND IRRIGATION (N/A)  POD 1 of a superficial wound debridement and wound vac placement.  1. CV-NSR with BBB , rate in the 70s. BP well controlled 2. Pulm-tolerating room air with good oxygen saturation 3. Renal-creatinine 0.78, electrolytes okay 4. H and H stable.  5. ID-cultures from 3/24 pending. No fevers or leukocytosis. Continue broad spectrum antibiotics.   6. Known mild-moderate pericardial effusion post emergency repair of type-I aortic dissection and CABG x1 last month. Plan repeat ECHO prior to discharge.  Plan: Wound vac with good suction. Will continue to follow cultures.    LOS: 8 days    Elgie Collard 03/26/2019  Chart reviewed, patient examined, agree with above.

## 2019-03-26 NOTE — Progress Notes (Signed)
  [facility] TEAM 1 - Stepdown/ICU TEAM  [facility] has nothing to add to the expert care being provided to this patient by [facility], and the [facility] Team.   I will sign off at this time.   Don't hesitate to contact me if medical consultation is desired.   Cherene Altes, MD [facility] Office  [phone]  03/26/2019, 10:22 AM

## 2019-03-26 NOTE — Progress Notes (Signed)
While assisting pt up pt stepped on wound vac tubing and pulled it off.  Placed back on and tegaderm applied with dressing wrapped around dressing to secure suction.  Suction is performing at -125.  SWAT RN assessed as well. Md notified. Will continue to monitor .[name] [name]

## 2019-03-26 NOTE — Progress Notes (Signed)
Progress Note  Patient Name: [name] [name] Date of Encounter: 03/26/2019  Primary Cardiologist: Carlyle Dolly, MD was Dr. Mare Ferrari   Subjective   Another good diuresis with IV lasix yesterday. Weight down 11 pounds in 2 days. Went back to OR yesterday for wound vac replacement. Tolerated well. Denies F/C. No CP. No orthopnea or PND. LE edema much improved  Inpatient Medications    Scheduled Meds: . sodium chloride   Intravenous Once  . allopurinol  100 mg Oral Daily  . amiodarone  200 mg Oral BID  . aspirin EC  81 mg Oral Daily  . atorvastatin  80 mg Oral q1800  . carvedilol  25 mg Oral BID WC  . colchicine  0.6 mg Oral Daily  . enoxaparin (LOVENOX) injection  40 mg Subcutaneous Q24H  . ferrous [id number] C-folic acid  1 capsule Oral TID PC  . furosemide  80 mg Intravenous BID  . ipratropium-albuterol  3 mL Nebulization BID  . losartan  100 mg Oral Daily  . pantoprazole  80 mg Oral BID  . potassium chloride  20 mEq Oral BID  . Ensure Max Protein  11 oz Oral BID  . sodium chloride flush  10-40 mL Intracatheter Q12H  . spironolactone  12.5 mg Oral Daily  . traZODone  100 mg Oral QHS   Continuous Infusions: . ciprofloxacin 400 mg (03/26/19 0414)  . lactated ringers 10 mL/hr at 03/25/19 1117  . lactated ringers    . vancomycin 1,500 mg (03/26/19 0619)   PRN Meds: acetaminophen **OR** acetaminophen, albuterol, HYDROmorphone (DILAUDID) injection, morphine injection, ondansetron **OR** ondansetron (ZOFRAN) IV, oxyCODONE-acetaminophen, sodium chloride flush   Vital Signs    Vitals:   03/25/19 1905 03/25/19 2327 03/26/19 0324 03/26/19 0334  BP: 121/62 127/79 138/71   Pulse:  83 78   Resp: (!) 22 18 18    Temp: 99 F (37.2 C) 98.8 F (37.1 C) 98.7 F (37.1 C)   TempSrc: Oral Oral Oral   SpO2: 97% 94% 94%   Weight:    101.8 kg  Height:        Intake/Output Summary (Last 24 hours) at 03/26/2019 0730 Last data filed at 03/26/2019 0414 Gross per 24  hour  Intake 850 ml  Output 3599 ml  Net -2749 ml   Last 3 Weights 03/26/2019 03/25/2019 03/25/2019  Weight (lbs) 224 lb 6.9 oz 227 lb 8.2 oz 227 lb 8.2 oz  Weight (kg) 101.8 kg 103.2 kg 103.2 kg      Telemetry    NSR 70-80s Personally reviewed  ECG    No new - Personally Reviewed  Physical Exam   General:  Sitting in chair appearing. No resp difficulty HEENT: normal Neck: supple. JVP 6. Carotids 2+ bilat; no bruits. No lymphadenopathy or thryomegaly appreciated. Cor: sternal wound vac ok PMI nondisplaced. Regular rate & rhythm. No rubs, gallops or murmurs. Lungs: clear Abdomen: soft, nontender, nondistended. No hepatosplenomegaly. No bruits or masses. Good bowel sounds. Extremities: no cyanosis, clubbing, rash, trace edema Neuro: alert & orientedx3, cranial nerves grossly intact. moves all 4 extremities w/o difficulty. Affect pleasant  Labs    Chemistry Recent Labs  Lab 03/23/19 0312 03/25/19 0500 03/26/19 0329  NA 137 138 138  K 4.4 3.1* 3.7  CL 102 97* 100  CO2 28 29 28   GLUCOSE 128* 100* 169*  BUN 11 13 16   CREATININE 0.79 0.82 0.78  CALCIUM 9.4 9.2 8.8*  ALBUMIN  --  2.7*  --   GFRNONAA >60 >  60 >60  GFRAA >60 >60 >60  ANIONGAP 7 12 10      Hematology Recent Labs  Lab 03/24/19 0211 03/25/19 0500 03/26/19 0329  WBC 8.1 9.8 9.3  RBC 3.14* 3.33* 2.97*  HGB 8.3* 8.8* 8.1*  HCT 27.9* 29.5* 26.4*  MCV 88.9 88.6 88.9  MCH 26.4 26.4 27.3  MCHC 29.7* 29.8* 30.7  RDW 14.6 14.9 15.1  PLT 425* 456* 391    Cardiac EnzymesNo results for input(s): TROPONINI in the last 168 hours. No results for input(s): TROPIPOC in the last 168 hours.   BNPNo results for input(s): BNP, PROBNP in the last 168 hours.   DDimer No results for input(s): DDIMER in the last 168 hours.   Radiology    No results found.  Cardiac Studies   Limited Echo 03/21/19 IMPRESSIONS    1. Moderate pericardial effusion primarily located peri-apically. There is > 25% respirophasic  variation in the mitral E inflow velocity. Septal bounce is not marked. The IVC is dilated to 2.7 cm but with normal respirophasic variation. 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 If patient is symptomatic, would consider hemodynamic cath.  2. Mild calcification of the aortic valve. The aortic valve was not assessed by doppler.  3. The interatrial septum was not assessed.  4. No evidence of mitral valve stenosis. No mitral regurgitation.  5. Normal RV size with mildly decreased systolic function.  6. The left ventricle had a visually estimated ejection fraction of of 55%. The cavity size was normal. indeterminate diastolic function. Images inadequate for wall motion analysis.  FINDINGS  Left Ventricle: The left ventricle has a visually estimated ejection fraction of of 55%. The cavity size was normal. There is no increase in left ventricular wall thickness. Left ventricular diastolic Doppler parameters are consistent with indeterminate  diastolic dysfunction. Right Ventricle: The right ventricle has mildly reduced systolic function. The cavity was normal. There is no increase in right ventricular wall thickness. Left Atrium: Left atrial size was normal in size. Right Atrium: Right atrial size was normal in size. Right atrial pressure is estimated at 8 mmHg. Interatrial Septum: The interatrial septum was not assessed. Pericardium: Moderate pericardial effusion primarily located peri-apically. Mitral Valve: The mitral valve is normal in structure. Mitral valve regurgitation is not visualized by color flow Doppler. No evidence of mitral valve stenosis. Aortic Valve: Mild calcification of the aortic valve. Not assessed by doppler. Venous: The inferior vena cava is dilated in size with greater than 50% respiratory variability.   LEFT VENTRICLE PLAX 2D LV EF:         55 %  RIGHT ATRIUM RA Pressure: 8 mmHg   Echocardiogram  03/18/2019: 1. Respiratory related leftward septal shift with septal shudder. Respiratory variation in mitral inflow. Dilated IVC with blunted respiratory collapse adjacent to RA. No definite RV diastolic collapse, no signifcant RA collapse, howevere suboptimal  image quality in subcostal window. Findings suggests effusive constrictive physiology, with possible early tamponade. BP and HR reviewed as reported in study. 2. The left ventricle has normal systolic function with an ejection fraction of 60-65%. The cavity size was normal. Left ventricular diastolic function could not be evaluated. 3. The right ventricle has normal systolic function. The cavity was normal. There is no increase in right ventricular wall thickness. 4. Small pericardial effusion. 5. The pericardial effusion is circumferential. 6. The inferior vena cava was dilated in size with <50% respiratory variability. 7. The aortic valve was not well  visualized Aortic valve regurgitation was not assessed by color flow Doppler. _______________   Patient Profile     58 y.o. 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 Cardiology was consulted for assistance with antiplatelets and anticoagulation  Assessment & Plan    1. 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 CAD s/p CABG 02/23/19 --cardiac cath 02/23/19 with 90% stenosis of the proximal RCA. He underwent PCI with DES x2 to the RCA; however, procedure was complicated by iatrogenic aortic root dissection likely due to guide catheter trauma. CT surgery was consulted and patient was taken to the OR emergently for  repair of acute type A ascending thoracic aortic dissection and single vessel CABG (SVG to RCA). --on ASA 325. Continue to hold plavix until course of debridement is complete --on BB and high-intensity statin    3. Post op A fib -- had single episode post-op AF treated successfully with IV amio during his initial hospitaization. No AC started given risk of bleeding after surgical repair of Ao dissection. AF has not recurred to date as far as we know. Thus would not start AC at this time. Continue oral amio 200 mg BID. Can decrease to 200 daily at d/c     4. Pericardial effusion  - Has small to moderate pericardial effusion with evidence of R-sided volume overload but no overt tamponade. Agree with colchicine. - Has tolerated diuresis well. Will need f/u echo in 6 to 8 weeks   5. Acute on chronic diastolic HF R>L - has diuresed well. Switch to po diuretics   I will see again on Monday. Call over the weekend with questions    For questions or updates, please contact [place] Please consult [email or web address] for contact info under     Signed, Glori Bickers, MD  03/26/2019, 7:30 AM

## 2019-03-27 NOTE — Progress Notes (Addendum)
      [address]       [place] [address]             [phone]      2 Days Post-Op Procedure(s) (LRB): SUPERFICIAL STERNAL WOUND DEBRIDEMENT (N/A) WOUND VAC CHANGE AND IRRIGATION (N/A) Subjective: Wound vac with good suction but was pulled out overnight by accident when the patient got up to use the restroom.   Objective: Vital signs in last 24 hours: Temp:  [98 F (36.7 C)-98.9 F (37.2 C)] 98.5 F (36.9 C) (03/29 0737) Pulse Rate:  [60-82] 63 (03/29 0737) Cardiac Rhythm: Normal sinus rhythm (03/29 0737) Resp:  [14-21] 17 (03/29 0737) BP: (117-139)/(64-75) 139/75 (03/29 0737) SpO2:  [94 %-98 %] 96 % (03/29 0741) Weight:  [101.6 kg] 101.6 kg (03/29 0423)     Intake/Output from previous day: 03/28 0701 - 03/29 0700 In: 1880 [P.O.:1880] Out: 3110 [Urine:3050; Drains:60] Intake/Output this shift: Total I/O In: -  Out: 400 [Urine:400]  General appearance: alert, cooperative and no distress Heart: regular rate and rhythm, S1, S2 normal, no murmur, click, rub or gallop Lungs: clear to auscultation bilaterally Abdomen: soft, non-tender; bowel sounds normal; no masses,  no organomegaly Extremities: 1-2+ pitting edema in bilateral lower extrmeity Wound: vac with good suction  Lab Results: Recent Labs    03/25/19 0500 03/26/19 0329  WBC 9.8 9.3  HGB 8.8* 8.1*  HCT 29.5* 26.4*  PLT 456* 391   BMET:  Recent Labs    03/25/19 0500 03/26/19 0329  NA 138 138  K 3.1* 3.7  CL 97* 100  CO2 29 28  GLUCOSE 100* 169*  BUN 13 16  CREATININE 0.82 0.78  CALCIUM 9.2 8.8*    PT/INR: No results for input(s): LABPROT, INR in the last 72 hours. ABG    Component Value Date/Time   PHART 7.451 (H) 03/18/2019 0329   HCO3 24.1 03/18/2019 0329   TCO2 25 03/18/2019 0329   ACIDBASEDEF 3.0 (H) 02/24/2019 0155   O2SAT 91.0 03/18/2019 0329   CBG (last 3)  No results for input(s): GLUCAP in the last 72 hours.  Assessment/Plan: S/P Procedure(s) (LRB):  SUPERFICIAL STERNAL WOUND DEBRIDEMENT (N/A) WOUND VAC CHANGE AND IRRIGATION (N/A)  POD 2 of a superficial wound debridement and wound vac placement.   1. CV-NSR with BBB , rate in the 70s. BP well controlled 2. Pulm-tolerating room air with good oxygen saturation 3. Renal-creatinine 0.78, electrolytes okay. On a fluid restriction. IV lasix administered. Appreciate the help of heart failure. 4. H and H stable.  5. ID-cultures from 3/24 NTD. No fevers or leukocytosis. Continue broad spectrum antibiotics.   6. Known mild-moderate pericardial effusion post emergency repair of type-I aortic dissection and CABG x1 last month. Plan repeat ECHO prior to discharge.  Plan: Back to the OR on Tuesday for further debridement and wound vac change. He is having more pain this morning-he has Dilaudid available every two hours and Percocet available every 4 hours. Encouraged to stay on top of his pain. If it continues can change breakthrough coverage. I would not favor Toradol due to recent CABG.   LOS: 9 days    Elgie Collard 03/27/2019   Chart reviewed, patient examined, agree with above. VAC has good suction this am. Planning wound eval and VAC change in OR Tuesday.

## 2019-03-27 NOTE — Progress Notes (Signed)
Pharmacy Antibiotic Note  [name] [name] is a 58 y.o. male admitted on 03/18/2019 with sepsis.  New purulent drainage noted from sternal wound, to OR for debridement and VAC placement on 3/24.  Currently on day#9 of antibiotics.   Afeb, wbc normal, renal function within normal limits.   Plan: Continue 1500 mg IV every 12 hours for expected AUC of 512.  Continue current cipro dosing Planning back to OR 3/31   Height: 5' 8"  (172.7 cm) Weight: 224 lb (101.6 kg) IBW/kg (Calculated) : 68.4  Temp (24hrs), Avg:98.4 F (36.9 C), Min:98 F (36.7 C), Max:98.9 F (37.2 C)  Recent Labs  Lab 03/21/19 0211 03/21/19 1155 03/22/19 0215 03/23/19 0312 03/24/19 0211 03/24/19 0648 03/24/19 1655 03/25/19 0500 03/26/19 0329  WBC 10.1  --  7.7 6.9 8.1  --   --  9.8 9.3  CREATININE 0.76  --  0.79 0.79  --   --   --  0.82 0.78  VANCOTROUGH  --   --  5*  --   --   --  13*  --   --   VANCOPEAK  --  14*  --   --   --  34  --   --   --     Estimated Creatinine Clearance: 116.3 mL/min (by C-G formula based on SCr of 0.78 mg/dL).    Allergies  Allergen Reactions  . Penicillins Hives, Rash and Other (See Comments)    Did it involve swelling of the face/tongue/throat, SOB, or low BP? Yes Did it involve sudden or severe rash/hives, skin peeling, or any reaction on the inside of your mouth or nose? Yes  Did you need to seek medical attention at a hospital or doctor's office? Yes When did it last happen?As a child. If all above answers are "NO", may proceed with cephalosporin use.    Antimicrobials this admission: Vanc 3/20 >>    Aztreonam 3/20>>3/27 Cipro 3/27> Flagyl 3/20 >> 3/22  Microbiology results: 3/24 Wound cx: ngtd 3/20 BCx: ng 3/20 UCx:  ng RSV panel: neg MRSA PCR: neg   Thank you for allowing pharmacy to be a part of this patient's care.  Erin Hearing PharmD., BCPS Clinical Pharmacist 03/27/2019 10:36 AM

## 2019-03-27 NOTE — Progress Notes (Signed)
Progress Note  Patient Name: [name] [name] Date of Encounter: 03/27/2019  Primary Cardiologist: Carlyle Dolly, MD was Dr. Mare Ferrari   Subjective   Wound vac dislodged overnight apparently but now back in. Still having some pain from site.  Now on oral torsemide. Weight stable. Urine clear. No SOB, orthopnea or PND. BPs stable. Creatinine 0.78  Inpatient Medications    Scheduled Meds:  sodium chloride   Intravenous Once   allopurinol  100 mg Oral Daily   amiodarone  200 mg Oral BID   aspirin EC  81 mg Oral Daily   atorvastatin  80 mg Oral q1800   carvedilol  25 mg Oral BID WC   colchicine  0.6 mg Oral Daily   enoxaparin (LOVENOX) injection  40 mg Subcutaneous Q24H   ferrous [id number] C-folic acid  1 capsule Oral TID PC   ipratropium-albuterol  3 mL Nebulization BID   losartan  100 mg Oral Daily   nutrition supplement (JUVEN)  1 packet Oral BID BM   pantoprazole  80 mg Oral BID   potassium chloride  20 mEq Oral BID   sodium chloride flush  10-40 mL Intracatheter Q12H   spironolactone  12.5 mg Oral Daily   torsemide  40 mg Oral Daily   traZODone  100 mg Oral QHS   Continuous Infusions:  ciprofloxacin 400 mg (03/27/19 0436)   lactated ringers 10 mL/hr at 03/25/19 1117   lactated ringers     vancomycin 1,500 mg (03/27/19 0604)   PRN Meds: acetaminophen **OR** acetaminophen, albuterol, HYDROmorphone (DILAUDID) injection, morphine injection, ondansetron **OR** ondansetron (ZOFRAN) IV, oxyCODONE-acetaminophen, sodium chloride flush   Vital Signs    Vitals:   03/26/19 2316 03/27/19 0423 03/27/19 0737 03/27/19 0741  BP: 123/70 138/73 139/75   Pulse: 66 60 63   Resp: 18 14 17    Temp: 98.2 F (36.8 C) 98.9 F (37.2 C) 98.5 F (36.9 C)   TempSrc: Oral Oral Oral   SpO2: 97% 96% 95% 96%  Weight:  101.6 kg    Height:        Intake/Output Summary (Last 24 hours) at 03/27/2019 1042 Last data filed at 03/27/2019 0900 Gross per 24  hour  Intake 1940 ml  Output 3360 ml  Net -1420 ml   Last 3 Weights 03/27/2019 03/26/2019 03/25/2019  Weight (lbs) 224 lb 224 lb 6.9 oz 227 lb 8.2 oz  Weight (kg) 101.606 kg 101.8 kg 103.2 kg      Telemetry    NSR 60-70s Personally reviewed  ECG    No new - Personally Reviewed  Physical Exam   General:  Sitting in chair . No resp difficulty HEENT: normal Neck: supple. JVP 5-6. Carotids 2+ bilat; no bruits. No lymphadenopathy or thryomegaly appreciated. Cor: + wound vac PMI nondisplaced. Regular rate & rhythm. No rubs, gallops or murmurs. Lungs: clear Abdomen: soft, nontender, nondistended. No hepatosplenomegaly. No bruits or masses. Good bowel sounds. Extremities: no cyanosis, clubbing, rash, tr edema + TED hose  Neuro: alert & orientedx3, cranial nerves grossly intact. moves all 4 extremities w/o difficulty. Affect pleasant   Labs    Chemistry Recent Labs  Lab 03/23/19 0312 03/25/19 0500 03/26/19 0329  NA 137 138 138  K 4.4 3.1* 3.7  CL 102 97* 100  CO2 28 29 28   GLUCOSE 128* 100* 169*  BUN 11 13 16   CREATININE 0.79 0.82 0.78  CALCIUM 9.4 9.2 8.8*  ALBUMIN  --  2.7*  --   GFRNONAA >60 >60 >60  GFRAA >60 >60 >60  ANIONGAP 7 12 10      Hematology Recent Labs  Lab 03/24/19 0211 03/25/19 0500 03/26/19 0329  WBC 8.1 9.8 9.3  RBC 3.14* 3.33* 2.97*  HGB 8.3* 8.8* 8.1*  HCT 27.9* 29.5* 26.4*  MCV 88.9 88.6 88.9  MCH 26.4 26.4 27.3  MCHC 29.7* 29.8* 30.7  RDW 14.6 14.9 15.1  PLT 425* 456* 391    Cardiac EnzymesNo results for input(s): TROPONINI in the last 168 hours. No results for input(s): TROPIPOC in the last 168 hours.   BNPNo results for input(s): BNP, PROBNP in the last 168 hours.   DDimer No results for input(s): DDIMER in the last 168 hours.   Radiology    Dg Chest Port 1 View  Result Date: 03/26/2019 CLINICAL DATA:  CABG. EXAM: PORTABLE CHEST 1 VIEW COMPARISON:  03/21/2019 FINDINGS: Interval placement of a right-sided PICC line. This  terminates at the mid SVC. Patient rotated to the right. Moderate cardiomegaly. Prior median sternotomy. No pleural effusion or pneumothorax. No congestive failure. Mild left base subsegmental atelectasis. The Chin overlies the apices minimally. IMPRESSION: Cardiomegaly without congestive failure. Electronically Signed   By: Abigail Miyamoto M.D.   On: 03/26/2019 07:36    Cardiac Studies   Limited Echo 03/21/19 IMPRESSIONS    1. Moderate pericardial effusion primarily located peri-apically. There is > 25% respirophasic variation in the mitral E inflow velocity. Septal bounce is not marked. The IVC is dilated to 2.7 cm but with normal respirophasic variation. 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 If patient is symptomatic, would consider hemodynamic cath.  2. Mild calcification of the aortic valve. The aortic valve was not assessed by doppler.  3. The interatrial septum was not assessed.  4. No evidence of mitral valve stenosis. No mitral regurgitation.  5. Normal RV size with mildly decreased systolic function.  6. The left ventricle had a visually estimated ejection fraction of of 55%. The cavity size was normal. indeterminate diastolic function. Images inadequate for wall motion analysis.  FINDINGS  Left Ventricle: The left ventricle has a visually estimated ejection fraction of of 55%. The cavity size was normal. There is no increase in left ventricular wall thickness. Left ventricular diastolic Doppler parameters are consistent with indeterminate  diastolic dysfunction. Right Ventricle: The right ventricle has mildly reduced systolic function. The cavity was normal. There is no increase in right ventricular wall thickness. Left Atrium: Left atrial size was normal in size. Right Atrium: Right atrial size was normal in size. Right atrial pressure is estimated at 8 mmHg. Interatrial Septum: The interatrial septum was not  assessed. Pericardium: Moderate pericardial effusion primarily located peri-apically. Mitral Valve: The mitral valve is normal in structure. Mitral valve regurgitation is not visualized by color flow Doppler. No evidence of mitral valve stenosis. Aortic Valve: Mild calcification of the aortic valve. Not assessed by doppler. Venous: The inferior vena cava is dilated in size with greater than 50% respiratory variability.   LEFT VENTRICLE PLAX 2D LV EF:         55 %  RIGHT ATRIUM RA Pressure: 8 mmHg   Echocardiogram 03/18/2019: 1. Respiratory related leftward septal shift with septal shudder. Respiratory variation in mitral inflow. Dilated IVC with blunted respiratory collapse adjacent to RA. No definite RV diastolic collapse, no signifcant RA collapse, howevere suboptimal  image quality in subcostal window. Findings suggests effusive constrictive physiology, with possible early tamponade. BP and HR reviewed as reported in  study. 2. The left ventricle has normal systolic function with an ejection fraction of 60-65%. The cavity size was normal. Left ventricular diastolic function could not be evaluated. 3. The right ventricle has normal systolic function. The cavity was normal. There is no increase in right ventricular wall thickness. 4. Small pericardial effusion. 5. The pericardial effusion is circumferential. 6. The inferior vena cava was dilated in size with <50% respiratory variability. 7. The aortic valve was not well visualized Aortic valve regurgitation was not assessed by color flow Doppler. _______________   Patient Profile     58 y.o. 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 Cardiology was consulted  for assistance with antiplatelets and anticoagulation  Assessment & Plan    1. 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 Dislodgrd overnight but now back in place --per CT surgery - will go back to OR on Tuesday for wound vac change  2. CAD s/p CABG 02/23/19 --cardiac cath 02/23/19 with 90% stenosis of the proximal RCA. He underwent PCI with DES x2 to the RCA; however, procedure was complicated by iatrogenic aortic root dissection likely due to guide catheter trauma. CT surgery was consulted and patient was taken to the OR emergently for repair of acute type A ascending thoracic aortic dissection and single vessel CABG (SVG to RCA). --on ASA 325. Continue to hold plavix until course of debridement is complete --on BB and high-intensity statin    3. Post op A fib -- had single episode post-op AF treated successfully with IV amio during his initial hospitaization. No AC started given risk of bleeding after surgical repair of Ao dissection. AF has not recurred to date as far as we know. Thus would not start AC at this time. Continue oral amio 200 mg BID. Can decrease to 200 daily at d/c     4. Pericardial effusion  - Has small to moderate pericardial effusion with evidence of R-sided volume overload but no overt tamponade. Agree with colchicine. - Has tolerated diuresis well. Will need f/u echo in 6 to 8 weeks   5. Acute on chronic diastolic HF R>L - has diuresed well. Now on oral torsemide and spiro. Doing well.   6. Hypokalemia - improving. K 3.7 Continue to supp. Check mag in am   I will see again on Monday. Call over the weekend with questions    For questions or updates, please contact [place] Please consult [email or web address] for contact info under     Signed, Glori Bickers, MD  03/27/2019, 10:42 AM

## 2019-03-28 ENCOUNTER — Inpatient Hospital Stay (HOSPITAL_COMMUNITY): Payer: Medicare Other

## 2019-03-28 DIAGNOSIS — I10 Essential (primary) hypertension: Secondary | ICD-10-CM

## 2019-03-28 DIAGNOSIS — I480 Paroxysmal atrial fibrillation: Secondary | ICD-10-CM

## 2019-03-28 DIAGNOSIS — Z9889 Other specified postprocedural states: Secondary | ICD-10-CM

## 2019-03-28 DIAGNOSIS — I361 Nonrheumatic tricuspid (valve) insufficiency: Secondary | ICD-10-CM

## 2019-03-28 DIAGNOSIS — Z8679 Personal history of other diseases of the circulatory system: Secondary | ICD-10-CM

## 2019-03-28 DIAGNOSIS — I2510 Atherosclerotic heart disease of native coronary artery without angina pectoris: Secondary | ICD-10-CM

## 2019-03-28 DIAGNOSIS — I7101 Dissection of thoracic aorta: Secondary | ICD-10-CM

## 2019-03-28 DIAGNOSIS — J449 Chronic obstructive pulmonary disease, unspecified: Secondary | ICD-10-CM

## 2019-03-28 LAB — TYPE AND SCREEN
ABO/RH(D): O POS
Antibody Screen: NEGATIVE
Unit division: 0

## 2019-03-28 LAB — BPAM RBC
Blood Product Expiration Date: 202004172359
Unit Type and Rh: 5100

## 2019-03-28 LAB — ECHOCARDIOGRAM LIMITED
Height: 68 in
WEIGHTICAEL: 3499.14 [oz_av]

## 2019-03-28 LAB — BASIC METABOLIC PANEL
Anion gap: 10 (ref 5–15)
BUN: 18 mg/dL (ref 6–20)
CO2: 30 mmol/L (ref 22–32)
Calcium: 9.6 mg/dL (ref 8.9–10.3)
Chloride: 97 mmol/L — ABNORMAL LOW (ref 98–111)
Creatinine, Ser: 0.84 mg/dL (ref 0.61–1.24)
GFR calc Af Amer: >60 mL/min (ref >60)
GFR calc non Af Amer: >60 mL/min (ref >60)
Glucose, Bld: 109 mg/dL — ABNORMAL HIGH (ref 70–99)
Potassium: 3.6 mmol/L (ref 3.5–5.1)
Sodium: 137 mmol/L (ref 135–145)

## 2019-03-28 LAB — AEROBIC/ANAEROBIC CULTURE W GRAM STAIN (SURGICAL/DEEP WOUND)

## 2019-03-28 LAB — MAGNESIUM: Magnesium: 1.6 mg/dL — ABNORMAL LOW (ref 1.7–2.4)

## 2019-03-28 MED ORDER — MAGNESIUM OXIDE 400 (241.3 MG) MG PO TABS
400.0000 mg | ORAL_TABLET | Freq: Every day | ORAL | Status: DC
  Administered 2019-03-30 – 2019-04-07 (×8): 400 mg via ORAL
  Filled 2019-03-28 (×8): qty 1

## 2019-03-28 MED ORDER — ENSURE MAX PROTEIN PO LIQD
11.0000 [oz_av] | Freq: Two times a day (BID) | ORAL | Status: DC
  Filled 2019-03-28 (×5): qty 330

## 2019-03-28 MED ORDER — MAGNESIUM SULFATE 4 GM/100ML IV SOLN
4.0000 g | Freq: Once | INTRAVENOUS | Status: AC
  Administered 2019-03-28: 4 g via INTRAVENOUS
  Filled 2019-03-28: qty 100

## 2019-03-28 NOTE — Plan of Care (Signed)
  Problem: Clinical Measurements: Goal: Ability to maintain clinical measurements within normal limits will improve Outcome: Progressing   Problem: Clinical Measurements: Goal: Respiratory complications will improve Outcome: Progressing   Problem: Clinical Measurements: Goal: Cardiovascular complication will be avoided Outcome: Progressing   Problem: Pain Managment: Goal: General experience of comfort will improve Outcome: Progressing

## 2019-03-28 NOTE — Progress Notes (Signed)
Physical Therapy Treatment Patient Details Name: [name] [name] MRN: [id number] DOB: 02/07/1961 Today's Date: 03/28/2019    History of Present Illness [name] [name] is a 58 y.o. male with medical history significant of COPD, PAF on amiodarone, not on oral anticoagulation, CAD, admitted for unstable angina at end of Feb, underwent LHC with PCI to 90% RCA lesion, unfortunately this ended up being complicated by type A aortic dissection. Went home, then presented with 3 day history of SOB. Pt found to have sepsis due to sternal abscess. Pt underwent sternal debridement and VAC placement on 03/22/19 and 03/25/19.     PT Comments    Pt making good progress with mobility. Pt to return to OR tomorrow.   Follow Up Recommendations  No PT follow up     Equipment Recommendations  None recommended by PT    Recommendations for Other Services       Precautions / Restrictions Precautions Precautions: Sternal Precaution Comments: wound vac Restrictions Weight Bearing Restrictions: No Other Position/Activity Restrictions: sternal precautions    Mobility  Bed Mobility Overal bed mobility: Needs Assistance Bed Mobility: Sidelying to Sit   Sidelying to sit: Supervision;HOB elevated       General bed mobility comments: supervision for lines  Transfers Overall transfer level: Needs assistance Equipment used: None Transfers: Sit to/from Stand Sit to Stand: Supervision         General transfer comment: Verbal cues for hand placement for hands on knees  Ambulation/Gait Ambulation/Gait assistance: Supervision Gait Distance (Feet): 400 Feet Assistive device: None Gait Pattern/deviations: Step-through pattern;Decreased stride length;Wide base of support Gait velocity: decr Gait velocity interpretation: 1.31 - 2.62 ft/sec, indicative of limited community ambulator General Gait Details: Pt with slight unsteadiness but no loss of balance.   Stairs             Wheelchair  Mobility    Modified Rankin (Stroke Patients Only)       Balance Overall balance assessment: Needs assistance Sitting-balance support: Feet supported;No upper extremity supported Sitting balance-Leahy Scale: Good     Standing balance support: During functional activity;No upper extremity supported Standing balance-Leahy Scale: Good                              Cognition Arousal/Alertness: Awake/alert Behavior During Therapy: WFL for tasks assessed/performed Overall Cognitive Status: Within Functional Limits for tasks assessed                                        Exercises      General Comments        Pertinent Vitals/Pain Pain Assessment: 0-10 Pain Score: 8  Pain Location: sternal Pain Descriptors / Indicators: Operative site guarding Pain Intervention(s): Limited activity within patient's tolerance;Monitored during session    Home Living                      Prior Function            PT Goals (current goals can now be found in the care plan section) Progress towards PT goals: Progressing toward goals    Frequency    Min 3X/week      PT Plan Current plan remains appropriate    Co-evaluation              AM-PAC PT "6 Clicks" Mobility  Outcome Measure  Help needed turning from your back to your side while in a flat bed without using bedrails?: None Help needed moving from lying on your back to sitting on the side of a flat bed without using bedrails?: A Little Help needed moving to and from a bed to a chair (including a wheelchair)?: A Little Help needed standing up from a chair using your arms (e.g., wheelchair or bedside chair)?: None Help needed to walk in hospital room?: A Little Help needed climbing 3-5 steps with a railing? : A Little 6 Click Score: 20    End of Session   Activity Tolerance: Patient tolerated treatment well Patient left: in chair;with call bell/phone within reach Nurse  Communication: Mobility status PT Visit Diagnosis: Unsteadiness on feet (R26.81);Muscle weakness (generalized) (M62.81)     Time: 9611-6435 PT Time Calculation (min) (ACUTE ONLY): 15 min  Charges:  $Gait Training: 8-22 mins                     [place] Pager [phone] Office [place] 03/28/2019, 10:36 AM

## 2019-03-28 NOTE — Care Management Important Message (Signed)
Important Message  Patient Details  Name: [name] [name] MRN: [id number] Date of Birth: June 09, 1961   Medicare Important Message Given:  Yes    [name] 03/28/2019, 1:20 PM

## 2019-03-28 NOTE — Progress Notes (Signed)
Occupational Therapy Treatment Patient Details Name: [name] [name] MRN: [id number] DOB: May 18, 1961 Today's Date: 03/28/2019    History of present illness [name] [name] is a 57 y.o. male with medical history significant of COPD, PAF on amiodarone, not on oral anticoagulation, CAD, admitted for unstable angina at end of Feb, underwent LHC with PCI to 90% RCA lesion, unfortunately this ended up being complicated by type A aortic dissection. Went home, then presented with 3 day history of SOB. Pt found to have sepsis due to sternal abscess. Pt underwent sternal debridement and VAC placement on 03/22/19 and 03/25/19.    OT comments  Pt progressing towards OT goals. Focus on HEP for RUE (see exercises below). Pt was able to open package, can drink, and complete mixing drink without any physical assist. OT will continue to follow acutely - plan to bring printed HEP (established this session) for patient.    Follow Up Recommendations  Outpatient OT;Supervision - Intermittent    Equipment Recommendations  None recommended by OT    Recommendations for Other Services      Precautions / Restrictions Precautions Precautions: Sternal Precaution Comments: wound vac Restrictions Weight Bearing Restrictions: No Other Position/Activity Restrictions: sternal precautions       Mobility Bed Mobility Overal bed mobility: Needs Assistance Bed Mobility: Sidelying to Sit   Sidelying to sit: Supervision;HOB elevated       General bed mobility comments: OOB when OT entered  Transfers Overall transfer level: Needs assistance Equipment used: None Transfers: Sit to/from Stand Sit to Stand: Supervision         General transfer comment: Verbal cues for hand placement for hands on knees    Balance Overall balance assessment: Needs assistance Sitting-balance support: Feet supported;No upper extremity supported Sitting balance-Leahy Scale: Good     Standing balance support: During  functional activity;No upper extremity supported Standing balance-Leahy Scale: Good                             ADL either performed or assessed with clinical judgement   ADL Overall ADL's : Needs assistance/impaired Eating/Feeding: Set up;Sitting Eating/Feeding Details (indicate cue type and reason): Red tubing provided                      Toilet Transfer: Supervision/safety           Functional mobility during ADLs: Supervision/safety       Vision       Perception     Praxis      Cognition Arousal/Alertness: Awake/alert Behavior During Therapy: WFL for tasks assessed/performed Overall Cognitive Status: Within Functional Limits for tasks assessed                                          Exercises Exercises: Other exercises Other Exercises Other Exercises: R hand tendon gliding ex Other Exercises: theraputty (yellow) digit flexion pressing against table Other Exercises: thumb 1st finger pinch against theraputty (yellow) Other Exercises: intrinsic hand strengthening with yellow theraputty Other Exercises: digit AB/AD duction with rubber bands (with and without thumb)   Shoulder Instructions       General Comments      Pertinent Vitals/ Pain       Pain Assessment: 0-10 Pain Score: 8  Pain Location: sternal Pain Descriptors / Indicators: Operative site guarding Pain Intervention(s): Limited  activity within patient's tolerance;Monitored during session;Premedicated before session  Home Living                                          Prior Functioning/Environment              Frequency  Min 3X/week        Progress Toward Goals  OT Goals(current goals can now be found in the care plan section)  Progress towards OT goals: Progressing toward goals  Acute Rehab OT Goals Patient Stated Goal: to be able to get home and feel better OT Goal Formulation: With patient Time For Goal Achievement:  04/07/19 Potential to Achieve Goals: Good  Plan Discharge plan remains appropriate    Co-evaluation                 AM-PAC OT "6 Clicks" Daily Activity     Outcome Measure   Help from another person eating meals?: A Little Help from another person taking care of personal grooming?: A Little Help from another person toileting, which includes using toliet, bedpan, or urinal?: A Little Help from another person bathing (including washing, rinsing, drying)?: A Little Help from another person to put on and taking off regular upper body clothing?: A Little Help from another person to put on and taking off regular lower body clothing?: A Little 6 Click Score: 18    End of Session Equipment Utilized During Treatment: Gait belt;Other (comment)(theraputty, rubber bands)  OT Visit Diagnosis: Muscle weakness (generalized) (M62.81);Pain;Unsteadiness on feet (R26.81) Pain - part of body: (sternal)   Activity Tolerance Patient tolerated treatment well   Patient Left in chair;with call bell/phone within reach   Nurse Communication Mobility status        Time: 7425-9563 OT Time Calculation (min): 30 min  Charges: OT General Charges $OT Visit: 1 Visit OT Treatments $Therapeutic Exercise: 8-22 mins  Hulda Humphrey OTR/L Acute Rehabilitation Services Pager: [phone] Office: [place] 03/28/2019, 1:59 PM

## 2019-03-28 NOTE — Progress Notes (Signed)
  Echocardiogram 2D Echocardiogram has been performed.  A limited echocardiogram was performed in accordance with the Director's protocol to limit patient to technician exposure during Covid 19.  Madelaine Etienne 03/28/2019, 9:03 AM

## 2019-03-28 NOTE — Progress Notes (Signed)
       [address]       [place] [address]             [phone]    3 Days Post-Op Procedure(s) (LRB): SUPERFICIAL STERNAL WOUND DEBRIDEMENT (N/A) WOUND VAC CHANGE AND IRRIGATION (N/A)   Subjective: Had some confusion last night after receiving tramadol, hydromorphone, and oxycodone. Awake and alert this am, sitting up in the bedside chair. Says parasternal pain is unchanged compared with late last week. Excellent diuresis past 24 hours.  Objective: Vital signs in last 24 hours: Temp:  [98.4 F (36.9 C)-98.7 F (37.1 C)] 98.6 F (37 C) (03/30 0742) Pulse Rate:  [66-86] 80 (03/30 0742) Cardiac Rhythm: Normal sinus rhythm;Bundle branch block (03/30 0410) Resp:  [12-27] 12 (03/30 0742) BP: (133-162)/(58-81) 159/76 (03/30 0742) SpO2:  [97 %-100 %] 97 % (03/30 0742) Weight:  [99.2 kg] 99.2 kg (03/30 0300)    Intake/Output from previous day: 03/29 0701 - 03/30 0700 In: 1077 [P.O.:960; I.V.:117] Out: 5620 [Urine:5600; Drains:20] Intake/Output this shift: No intake/output data recorded.  General appearance: alert, cooperative and no distress Heart: regular rate and rhythm,  Lungs: breath sounds clear Extremities: lower extremity edema improved, now 1+  Wound: vac well positioned and functioning appropriately. Minimal serous drainage.  Lab Results: Recent Labs    03/26/19 0329  WBC 9.3  HGB 8.1*  HCT 26.4*  PLT 391   BMET:  Recent Labs    03/26/19 0329  NA 138  K 3.7  CL 100  CO2 28  GLUCOSE 169*  BUN 16  CREATININE 0.78  CALCIUM 8.8*    PT/INR: No results for input(s): LABPROT, INR in the last 72 hours. ABG    Component Value Date/Time   PHART 7.451 (H) 03/18/2019 0329   HCO3 24.1 03/18/2019 0329   TCO2 25 03/18/2019 0329   ACIDBASEDEF 3.0 (H) 02/24/2019 0155   O2SAT 91.0 03/18/2019 0329   CBG (last 3)  No results for input(s): GLUCAP in the last 72 hours.  Assessment/Plan: S/P Procedure(s) (LRB): SUPERFICIAL STERNAL WOUND  DEBRIDEMENT (N/A) WOUND VAC CHANGE AND IRRIGATION (N/A)  POD 2 of a superficial wound debridement and wound vac placement.   1. CV-NSR with BBB , rate in the 70-80s. BP well controlled 2. Pulm-tolerating room air with good oxygen saturation 3. Renal-last creatinine 0.78 on 3/28, electrolytes okay. On a fluid restriction. Diuretic changed to Demadex per Heart Failure with excellent response. Repeat BMP in AM. 4. H and H stable, repeat CBC in AM.  5. ID-cultures from 3/24 NTD. No fevers or leukocytosis.Continue broad spectrum antibiotics. 6.Known mild-moderate pericardial effusion post emergency repair of type-I aortic dissection and CABG x1 last month. Plan repeat ECHO today.  Plan: Back to the OR on tomorrow for further debridement and wound vac change. He has chronic back pain and was taking a narcotic analgesic routinely prior to repair of the aortic dissection. Dilaudid available every two hours and Percocet available every 4 hours.    LOS: 10 days    Antony Odea, [place] [phone] 03/28/2019

## 2019-03-28 NOTE — Progress Notes (Signed)
Progress Note  Patient Name: [name] [name] Date of Encounter: 03/28/2019  Primary Cardiologist: Carlyle Dolly, MD   Subjective   Surgical site pain. No dyspnea. Getting echo.  Inpatient Medications    Scheduled Meds: . sodium chloride   Intravenous Once  . allopurinol  100 mg Oral Daily  . amiodarone  200 mg Oral BID  . aspirin EC  81 mg Oral Daily  . atorvastatin  80 mg Oral q1800  . carvedilol  25 mg Oral BID WC  . colchicine  0.6 mg Oral Daily  . enoxaparin (LOVENOX) injection  40 mg Subcutaneous Q24H  . ferrous [id number]-[id number] C-folic acid  1 capsule Oral TID PC  . ipratropium-albuterol  3 mL Nebulization BID  . losartan  100 mg Oral Daily  . nutrition supplement (JUVEN)  1 packet Oral BID BM  . pantoprazole  80 mg Oral BID  . potassium chloride  20 mEq Oral BID  . Ensure Max Protein  11 oz Oral BID  . sodium chloride flush  10-40 mL Intracatheter Q12H  . spironolactone  12.5 mg Oral Daily  . torsemide  40 mg Oral Daily  . traZODone  100 mg Oral QHS   Continuous Infusions: . ciprofloxacin 400 mg (03/28/19 0513)  . lactated ringers 10 mL/hr at 03/25/19 1117  . lactated ringers    . magnesium sulfate 1 - 4 g bolus IVPB    . vancomycin 1,500 mg (03/28/19 4982)   PRN Meds: acetaminophen **OR** acetaminophen, albuterol, HYDROmorphone (DILAUDID) injection, morphine injection, ondansetron **OR** ondansetron (ZOFRAN) IV, oxyCODONE-acetaminophen, sodium chloride flush   Vital Signs    Vitals:   03/28/19 0134 03/28/19 0300 03/28/19 0742 03/28/19 0837  BP: (!) 141/80 (!) 162/81 (!) 159/76   Pulse: 86 79 80   Resp: 16 18 12 13   Temp: 98.6 F (37 C) 98.6 F (37 C) 98.6 F (37 C)   TempSrc: Oral Oral Oral   SpO2: 99% 98% 97% 96%  Weight:  99.2 kg    Height:        Intake/Output Summary (Last 24 hours) at 03/28/2019 0856 Last data filed at 03/28/2019 0603 Gross per 24 hour  Intake 1077 ml  Output 4470 ml  Net -3393 ml   Last 3 Weights 03/28/2019  03/27/2019 03/26/2019  Weight (lbs) 218 lb 11.1 oz 224 lb 224 lb 6.9 oz  Weight (kg) 99.2 kg 101.606 kg 101.8 kg      Telemetry    NSR, transient STach last night - Personally Reviewed  ECG    No new tracing - Personally Reviewed  Physical Exam  Woundvac in place GEN: No acute distress.   Neck: No JVD Cardiac: RRR, no murmurs, rubs, or gallops.  Respiratory: Clear to auscultation bilaterally. GI: Soft, nontender, non-distended  MS: No edema; No deformity. Neuro:  Nonfocal  Psych: Normal affect   Labs    Chemistry Recent Labs  Lab 03/23/19 0312 03/25/19 0500 03/26/19 0329  NA 137 138 138  K 4.4 3.1* 3.7  CL 102 97* 100  CO2 28 29 28   GLUCOSE 128* 100* 169*  BUN 11 13 16   CREATININE 0.79 0.82 0.78  CALCIUM 9.4 9.2 8.8*  ALBUMIN  --  2.7*  --   GFRNONAA >60 >60 >60  GFRAA >60 >60 >60  ANIONGAP 7 12 10      Hematology Recent Labs  Lab 03/24/19 0211 03/25/19 0500 03/26/19 0329  WBC 8.1 9.8 9.3  RBC 3.14* 3.33* 2.97*  HGB 8.3* 8.8* 8.1*  HCT 27.9* 29.5* 26.4*  MCV 88.9 88.6 88.9  MCH 26.4 26.4 27.3  MCHC 29.7* 29.8* 30.7  RDW 14.6 14.9 15.1  PLT 425* 456* 391    Cardiac EnzymesNo results for input(s): TROPONINI in the last 168 hours. No results for input(s): TROPIPOC in the last 168 hours.   BNPNo results for input(s): BNP, PROBNP in the last 168 hours.   DDimer No results for input(s): DDIMER in the last 168 hours.   Radiology    No results found.  Cardiac Studies  Limited Echo 03/21/19 IMPRESSIONS   1. Moderate pericardial effusion primarily located peri-apically. There is >25% respirophasic variation in the mitral E inflow velocity. Septal bounce is not marked. The IVC is dilated to 2.7 cm but with normal respirophasic variation. 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 If patient is symptomatic, would consider hemodynamic cath. 2. Mild calcification of the aortic valve.  The aortic valve was not assessed by doppler. 3. The interatrial septum was not assessed. 4. No evidence of mitral valve stenosis. No mitral regurgitation. 5. Normal RV size with mildly decreased systolic function. 6. The left ventricle had a visually estimated ejection fraction of of 55%. The cavity size was normal. indeterminate diastolic function. Images inadequate for wall motion analysis.   Echocardiogram 03/18/2019: 1. Respiratory related leftward septal shift with septal shudder. Respiratory variation in mitral inflow. Dilated IVC with blunted respiratory collapse adjacent to RA. No definite RV diastolic collapse, no signifcant RA collapse, howevere suboptimal  image quality in subcostal window. Findings suggests effusive constrictive physiology, with possible early tamponade. BP and HR reviewed as reported in study. 2. The left ventricle has normal systolic function with an ejection fraction of 60-65%. The cavity size was normal. Left ventricular diastolic function could not be evaluated. 3. The right ventricle has normal systolic function. The cavity was normal. There is no increase in right ventricular wall thickness. 4. Small pericardial effusion. 5. The pericardial effusion is circumferential. 6. The inferior vena cava was dilated in size with <50% respiratory variability. 7. The aortic valve was not well visualized Aortic valve regurgitation was not assessed by color flow Doppler.  CATH 02/23/2019  Dist LM to Ost LAD lesion is 45% stenosed.  Prox Cx to Mid Cx lesion is 30% stenosed.  Prox LAD lesion is 30% stenosed.  Prox RCA lesion is 90% stenosed.  Post intervention, there is a 0% residual stenosis.  A drug-eluting stent was successfully placed using a STENT SYNERGY DES 2.5X16.  A drug-eluting stent was successfully placed using a STENT SYNERGY DES 2.75X12.  The left ventricular systolic function is normal.  LV end diastolic pressure is normal.  The left  ventricular ejection fraction is 55-65% by visual estimate.   1. Single vessel obstructive CAD involving the proximal RCA 2. Normal LV function 3. Normal LVEDP 4. Successful stenting of the proximal RCA with DES x 2 5. 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   Plan: emergent Aortic CT angio. Dr. Prescott Gum to evaluate. Will hold Brilinta until status of dissection is clarified.   Patient Profile     58 y.o. male with a history of CAD s/p PCI with DES to RCA in 0/3546 complicated by type A aortic dissection s/p repair with single vessel CABG (SVG to RCA),post-op atrial fibrillation/flutter on Amiodarone, CVA, hypertension, hyperlipidemia, COPD, and tobacco abusewhowas admitted for sepsis secondary to sternal abscess. Cardiology was consulted for assistance with antiplatelets  and anticoagulation  Assessment & Plan    1. Sepsis secondary to sternal abscess  Hemodynamically stable. On antibiotics. Plan for debridement again tomorrow AM, repositioning of wound vac. 2. CAD s/p CABG 02/23/19 cardiac cath 02/23/19 with 90% stenosis of the proximal RCA. He underwent PCI with DES x2 to the RCA; however, procedure was complicated by iatrogenic aortic root dissection likely due to guide catheter trauma. CT surgery was consulted and patient was taken to the OR emergently for repair of acute type A ascending thoracic aortic dissection and single vessel CABG (SVG to RCA). Preserved LVEF, no current angina. On statin and beta blocker and hi-dose ASA (clopidogrel to start after last surgical procedure). 3. Post op A fib No recurrence/ Anticoagulants not prescribed (single event, needs invasive procedures). Continue oral amio 200 mg BID. Can decrease to 200 daily at d/c   4. Pericardial effusion  On colchicine. Appears to be decreasing in size. 5. Acute on chronic diastolic HF R>L 18.9Q net diuresis by in/out. Weight down 10 kg from peak. Trivial leg edema. Back to  approx 220 lb, which was his weight last November. On oral torsemide and spiro - may be able to stop them soon. 6. Hypokalemia K3.7 yesterday. Mg 1.6 today.     For questions or updates, please contact [place] Please consult [email or web address] for contact info under        Signed, Sanda Klein, MD  03/28/2019, 8:56 AM

## 2019-03-29 ENCOUNTER — Encounter (HOSPITAL_COMMUNITY)
Admission: EM | Disposition: A | Discharge status: Home Health | Payer: Self-pay | Source: Home / Self Care | Attending: Cardiothoracic Surgery

## 2019-03-29 ENCOUNTER — Inpatient Hospital Stay (HOSPITAL_COMMUNITY): Payer: Medicare Other | Admitting: Certified Registered Nurse Anesthetist

## 2019-03-29 ENCOUNTER — Inpatient Hospital Stay (HOSPITAL_COMMUNITY): Payer: Medicare Other

## 2019-03-29 ENCOUNTER — Encounter (HOSPITAL_COMMUNITY): Payer: Self-pay | Admitting: Anesthesiology

## 2019-03-29 DIAGNOSIS — I9789 Other postprocedural complications and disorders of the circulatory system, not elsewhere classified: Secondary | ICD-10-CM

## 2019-03-29 HISTORY — PX: APPLICATION OF WOUND VAC: SHX5189

## 2019-03-29 HISTORY — PX: STERNAL WOUND DEBRIDEMENT: SHX1058

## 2019-03-29 LAB — CBC
HCT: 30.9 % — ABNORMAL LOW (ref 39.0–52.0)
Hemoglobin: 9 g/dL — ABNORMAL LOW (ref 13.0–17.0)
MCH: 25.9 pg — ABNORMAL LOW (ref 26.0–34.0)
MCHC: 29.1 g/dL — ABNORMAL LOW (ref 30.0–36.0)
MCV: 89 fL (ref 80.0–100.0)
Platelets: 509 10*3/uL — ABNORMAL HIGH (ref 150–400)
RBC: 3.47 MIL/uL — ABNORMAL LOW (ref 4.22–5.81)
RDW: 15.1 % (ref 11.5–15.5)
WBC: 9.4 10*3/uL (ref 4.0–10.5)
nRBC: 0 % (ref 0.0–0.2)

## 2019-03-29 LAB — BASIC METABOLIC PANEL
Anion gap: 7 (ref 5–15)
BUN: 22 mg/dL — ABNORMAL HIGH (ref 6–20)
CO2: 33 mmol/L — ABNORMAL HIGH (ref 22–32)
Calcium: 9.6 mg/dL (ref 8.9–10.3)
Chloride: 98 mmol/L (ref 98–111)
Creatinine, Ser: 0.85 mg/dL (ref 0.61–1.24)
GFR calc Af Amer: >60 mL/min (ref >60)
GFR calc non Af Amer: >60 mL/min (ref >60)
Glucose, Bld: 102 mg/dL — ABNORMAL HIGH (ref 70–99)
POTASSIUM: 3.8 mmol/L (ref 3.5–5.1)
Sodium: 138 mmol/L (ref 135–145)

## 2019-03-29 LAB — PREPARE RBC (CROSSMATCH)

## 2019-03-29 SURGERY — DEBRIDEMENT, WOUND, STERNUM
Anesthesia: General | Site: Chest

## 2019-03-29 MED ORDER — ZINC SULFATE 220 (50 ZN) MG PO CAPS
220.0000 mg | ORAL_CAPSULE | Freq: Every day | ORAL | Status: DC
  Administered 2019-03-29 – 2019-04-07 (×9): 220 mg via ORAL
  Filled 2019-03-29 (×9): qty 1

## 2019-03-29 MED ORDER — SODIUM CHLORIDE 0.9 % IV SOLN: 10.0000 mL/h | Freq: Once | INTRAVENOUS | Status: DC

## 2019-03-29 MED ORDER — ONDANSETRON HCL 4 MG/2ML IJ SOLN
INTRAMUSCULAR | Status: DC | PRN
  Administered 2019-03-29: 4 mg via INTRAVENOUS

## 2019-03-29 MED ORDER — FENTANYL CITRATE (PF) 100 MCG/2ML IJ SOLN
INTRAMUSCULAR | Status: DC | PRN
  Administered 2019-03-29 (×2): 25 ug via INTRAVENOUS
  Administered 2019-03-29: 75 ug via INTRAVENOUS
  Administered 2019-03-29: 25 ug via INTRAVENOUS

## 2019-03-29 MED ORDER — FENTANYL CITRATE (PF) 250 MCG/5ML IJ SOLN
INTRAMUSCULAR | Status: AC
  Filled 2019-03-29: qty 5

## 2019-03-29 MED ORDER — DEXAMETHASONE SODIUM PHOSPHATE 10 MG/ML IJ SOLN
INTRAMUSCULAR | Status: DC | PRN
  Administered 2019-03-29: 5 mg via INTRAVENOUS

## 2019-03-29 MED ORDER — HEMOSTATIC AGENTS (NO CHARGE) OPTIME
TOPICAL | Status: DC | PRN
  Administered 2019-03-29: 1 via TOPICAL

## 2019-03-29 MED ORDER — LIDOCAINE 2% (20 MG/ML) 5 ML SYRINGE
INTRAMUSCULAR | Status: DC | PRN
  Administered 2019-03-29: 60 mg via INTRAVENOUS

## 2019-03-29 MED ORDER — BOOST PO LIQD
237.0000 mL | Freq: Three times a day (TID) | ORAL | Status: DC
  Filled 2019-03-29 (×4): qty 237

## 2019-03-29 MED ORDER — OXYCODONE HCL 5 MG/5ML PO SOLN: 5.0000 mg | Freq: Once | ORAL | Status: DC | PRN

## 2019-03-29 MED ORDER — LACTATED RINGERS IV SOLN
INTRAVENOUS | Status: DC | PRN
  Administered 2019-03-29: 08:00:00 via INTRAVENOUS

## 2019-03-29 MED ORDER — BISACODYL 5 MG PO TBEC
10.0000 mg | DELAYED_RELEASE_TABLET | Freq: Every day | ORAL | Status: DC
  Administered 2019-03-30 – 2019-04-03 (×4): 10 mg via ORAL
  Filled 2019-03-29 (×5): qty 2

## 2019-03-29 MED ORDER — FENTANYL CITRATE (PF) 100 MCG/2ML IJ SOLN: 25.0000 ug | INTRAMUSCULAR | Status: DC | PRN

## 2019-03-29 MED ORDER — SENNOSIDES-DOCUSATE SODIUM 8.6-50 MG PO TABS
1.0000 | ORAL_TABLET | Freq: Every day | ORAL | Status: DC
  Administered 2019-03-29 – 2019-04-01 (×3): 1 via ORAL
  Filled 2019-03-29 (×5): qty 1

## 2019-03-29 MED ORDER — TRAMADOL HCL 50 MG PO TABS
50.0000 mg | ORAL_TABLET | Freq: Four times a day (QID) | ORAL | Status: DC | PRN
  Administered 2019-03-31 – 2019-04-06 (×6): 100 mg via ORAL
  Administered 2019-04-06: 15:00:00 50 mg via ORAL
  Administered 2019-04-07 (×2): 100 mg via ORAL
  Filled 2019-03-29: qty 1
  Filled 2019-03-29 (×8): qty 2

## 2019-03-29 MED ORDER — MIDAZOLAM HCL 2 MG/2ML IJ SOLN
INTRAMUSCULAR | Status: AC
  Filled 2019-03-29: qty 2

## 2019-03-29 MED ORDER — MIDAZOLAM HCL 5 MG/5ML IJ SOLN
INTRAMUSCULAR | Status: DC | PRN
  Administered 2019-03-29: 2 mg via INTRAVENOUS

## 2019-03-29 MED ORDER — SODIUM CHLORIDE 0.9 % IR SOLN
Status: DC | PRN
  Administered 2019-03-29: 1000 mL

## 2019-03-29 MED ORDER — EPHEDRINE SULFATE 50 MG/ML IJ SOLN
INTRAMUSCULAR | Status: DC | PRN
  Administered 2019-03-29 (×2): 5 mg via INTRAVENOUS

## 2019-03-29 MED ORDER — STERILE WATER FOR IRRIGATION IR SOLN
Status: DC | PRN
  Administered 2019-03-29: 1000 mL

## 2019-03-29 MED ORDER — HYDROMORPHONE HCL 1 MG/ML IJ SOLN
1.0000 mg | INTRAMUSCULAR | Status: DC | PRN
  Administered 2019-03-29 – 2019-03-30 (×3): 1 mg via INTRAVENOUS
  Filled 2019-03-29 (×3): qty 1

## 2019-03-29 MED ORDER — POTASSIUM CHLORIDE 10 MEQ/50ML IV SOLN: 10.0000 meq | Freq: Every day | INTRAVENOUS | Status: DC | PRN

## 2019-03-29 MED ORDER — CIPROFLOXACIN IN D5W 400 MG/200ML IV SOLN
400.0000 mg | Freq: Two times a day (BID) | INTRAVENOUS | Status: DC
  Administered 2019-03-29 – 2019-04-01 (×6): 400 mg via INTRAVENOUS
  Filled 2019-03-29 (×8): qty 200

## 2019-03-29 MED ORDER — OXYCODONE HCL 5 MG PO TABS: 5.0000 mg | ORAL_TABLET | Freq: Once | ORAL | Status: DC | PRN

## 2019-03-29 MED ORDER — SODIUM CHLORIDE 0.9% IV SOLUTION: Freq: Once | INTRAVENOUS | Status: AC

## 2019-03-29 MED ORDER — VANCOMYCIN HCL 1000 MG IV SOLR
INTRAVENOUS | Status: AC
  Administered 2019-03-29: 1000 mL
  Filled 2019-03-29: qty 1000

## 2019-03-29 MED ORDER — SODIUM CHLORIDE 0.9 % IV SOLN
INTRAVENOUS | Status: DC | PRN
  Administered 2019-03-29: 1000 mL via INTRAVENOUS
  Administered 2019-03-31: 250 mL via INTRAVENOUS
  Administered 2019-04-01: 20:00:00 35 mL via INTRAVENOUS
  Administered 2019-04-02 – 2019-04-03 (×2): 250 mL via INTRAVENOUS

## 2019-03-29 MED ORDER — SODIUM CHLORIDE 0.9 % IV SOLN
INTRAVENOUS | Status: DC | PRN
  Administered 2019-03-29: 50 ug/min via INTRAVENOUS

## 2019-03-29 MED ORDER — PROPOFOL 1000 MG/100ML IV EMUL
INTRAVENOUS | Status: AC
  Filled 2019-03-29: qty 100

## 2019-03-29 MED ORDER — EPHEDRINE 5 MG/ML INJ
INTRAVENOUS | Status: AC
  Filled 2019-03-29: qty 10

## 2019-03-29 MED ORDER — PROPOFOL 10 MG/ML IV BOLUS
INTRAVENOUS | Status: AC
  Filled 2019-03-29: qty 20

## 2019-03-29 MED ORDER — PROPOFOL 10 MG/ML IV BOLUS
INTRAVENOUS | Status: DC | PRN
  Administered 2019-03-29: 50 mg via INTRAVENOUS
  Administered 2019-03-29: 125 mg via INTRAVENOUS

## 2019-03-29 MED ORDER — SODIUM CHLORIDE 0.9 % IV SOLN
INTRAVENOUS | Status: DC | PRN
  Administered 2019-03-29: 07:00:00 via INTRAVENOUS

## 2019-03-29 MED ORDER — PHENYLEPHRINE 40 MCG/ML (10ML) SYRINGE FOR IV PUSH (FOR BLOOD PRESSURE SUPPORT)
PREFILLED_SYRINGE | INTRAVENOUS | Status: AC
  Filled 2019-03-29: qty 10

## 2019-03-29 MED ORDER — ONDANSETRON HCL 4 MG/2ML IJ SOLN: 4.0000 mg | Freq: Once | INTRAMUSCULAR | Status: DC | PRN

## 2019-03-29 SURGICAL SUPPLY — 30 items
BAG DECANTER FOR FLEXI CONT (MISCELLANEOUS) ×3 IMPLANT
BENZOIN TINCTURE PRP APPL 2/3 (GAUZE/BANDAGES/DRESSINGS) IMPLANT
CANISTER SUCT 3000ML PPV (MISCELLANEOUS) ×3 IMPLANT
CANISTER WOUND CARE 500ML ATS (WOUND CARE) ×3 IMPLANT
COVER SURGICAL LIGHT HANDLE (MISCELLANEOUS) ×3 IMPLANT
DRAPE LAPAROSCOPIC ABDOMINAL (DRAPES) ×3 IMPLANT
DRSG VAC ATS LRG SENSATRAC (GAUZE/BANDAGES/DRESSINGS) IMPLANT
DRSG VAC ATS MED SENSATRAC (GAUZE/BANDAGES/DRESSINGS) IMPLANT
DRSG VAC ATS SM SENSATRAC (GAUZE/BANDAGES/DRESSINGS) IMPLANT
DRSG VERSA FOAM LRG 10X15 (GAUZE/BANDAGES/DRESSINGS) ×3 IMPLANT
ELECT BLADE 4.0 EZ CLEAN MEGAD (MISCELLANEOUS) ×3
ELECT REM PT RETURN 9FT ADLT (ELECTROSURGICAL) ×3
ELECTRODE BLDE 4.0 EZ CLN MEGD (MISCELLANEOUS) ×1 IMPLANT
ELECTRODE REM PT RTRN 9FT ADLT (ELECTROSURGICAL) ×1 IMPLANT
GLOVE BIO SURGEON STRL SZ7.5 (GLOVE) ×3 IMPLANT
GOWN STRL REUS W/ TWL LRG LVL3 (GOWN DISPOSABLE) ×2 IMPLANT
GOWN STRL REUS W/TWL LRG LVL3 (GOWN DISPOSABLE) ×4
HEMOSTAT POWDER SURGIFOAM 1G (HEMOSTASIS) ×3 IMPLANT
KIT BASIN OR (CUSTOM PROCEDURE TRAY) ×3 IMPLANT
KIT TURNOVER KIT B (KITS) ×3 IMPLANT
NS IRRIG 1000ML POUR BTL (IV SOLUTION) ×3 IMPLANT
PACK GENERAL/GYN (CUSTOM PROCEDURE TRAY) ×3 IMPLANT
SUT STEEL STERNAL CCS#1 18IN (SUTURE) ×3 IMPLANT
SUT STEEL SZ 6 DBL 3X14 BALL (SUTURE) ×3 IMPLANT
SWAB COLLECTION DEVICE MRSA (MISCELLANEOUS) ×3 IMPLANT
TOWEL GREEN STERILE (TOWEL DISPOSABLE) ×3 IMPLANT
TOWEL GREEN STERILE FF (TOWEL DISPOSABLE) ×3 IMPLANT
TRAY FOLEY MTR SLVR 16FR STAT (SET/KITS/TRAYS/PACK) IMPLANT
TUBE ANAEROBIC SPECIMEN COL (MISCELLANEOUS) ×3 IMPLANT
WATER STERILE IRR 1000ML POUR (IV SOLUTION) ×3 IMPLANT

## 2019-03-29 NOTE — Anesthesia Procedure Notes (Signed)
Procedure Name: LMA Insertion Date/Time: 03/29/2019 7:46 AM Performed by: Neldon Newport, CRNA Pre-anesthesia Checklist: Timeout performed, Patient being monitored, Suction available, Emergency Drugs available and Patient identified Patient Re-evaluated:Patient Re-evaluated prior to induction Oxygen Delivery Method: Circle system utilized Preoxygenation: Pre-oxygenation with 100% oxygen Induction Type: IV induction Ventilation: Mask ventilation without difficulty LMA: LMA inserted LMA Size: 5.0 Placement Confirmation: CO2 detector,  positive ETCO2 and breath sounds checked- equal and bilateral Tube secured with: Tape Dental Injury: Teeth and Oropharynx as per pre-operative assessment

## 2019-03-29 NOTE — Transfer of Care (Signed)
Immediate Anesthesia Transfer of Care Note  Patient: [name] [name]  Procedure(s) Performed: SUPERFICIAL STERNAL WOUND DEBRIDEMENT (N/A Chest) WOUND VAC CHANGE (N/A Chest)  Patient Location: PACU  Anesthesia Type:General  Level of Consciousness: awake, alert  and oriented  Airway & Oxygen Therapy: Patient Spontanous Breathing and Patient connected to face mask oxygen  Post-op Assessment: Report given to RN, Post -op Vital signs reviewed and stable and Patient moving all extremities X 4  Post vital signs: Reviewed and stable  Last Vitals:  Vitals Value Taken Time  BP 136/85 03/29/2019  9:19 AM  Temp    Pulse 71 03/29/2019  9:21 AM  Resp 15 03/29/2019  9:21 AM  SpO2 100 % 03/29/2019  9:21 AM  Vitals shown include unvalidated device data.  Last Pain:  Vitals:   03/29/19 0559  TempSrc:   PainSc: 7       Patients Stated Pain Goal: 2 (73/41/93 7902)  Complications: No apparent anesthesia complications

## 2019-03-29 NOTE — Op Note (Signed)
NAME: [name] [name], [name] [name] MEDICAL RECORD GO:[id number] ACCOUNT [id number] DATE OF BIRTH:October 02, 1961 FACILITY: [facility] LOCATION: [facility] PHYSICIAN:PETER VAN TRIGT III, MD  OPERATIVE REPORT  DATE OF PROCEDURE:  03/29/2019  OPERATION:  Sternal wound irrigation and excisional debridement with removal of upper sternal wires.  Wound VAC change.  SURGEON:  Ivin Poot, MD  ASSISTANT:  Enid Cutter, PA-C.  ANESTHESIA:  General.  PREOPERATIVE DIAGNOSIS:  Superficial sternal wound infection after emergency sternotomy for repair of type A aortic dissection and a combined CABG x1 on 02/23/2019.  POSTOPERATIVE DIAGNOSIS:  Superficial sternal wound infection after emergency sternotomy for repair of type A aortic dissection and a combined CABG x1 on 02/23/2019.  DESCRIPTION OF PROCEDURE:  After informed consent was documented in preoperative holding and final questions addressed, the patient was brought back to the operating room and placed supine on the operating table.  General anesthesia was induced under a  LMA airway.  The previously placed wound VAC sheaths and sponge were removed.  The chest was prepped and draped as a sterile field.  A proper time-out was performed.  The wound was inspected.  The upper sternal wires had torn through the manubrium and provided no structural support of the sternal wound and these wires were removed.  There was poor quality rubbery fat tissue in the wound in between the sternal edges  which was removed with sharp excision as well as curettage.  The sternal edges were also freshened with rongeurs, curettage and topical hemostatic.  Thrombin paste was applied for hemostasis.  The wound was explored towards the inferior aspect and the  sternal wires at the lower portion of the sternal wound appeared to be intact.  There was a space, which was entered and drained some cloudy fluid of the mid sternum.  There was no exposure of the heart aortic graft or cardiac  sutures.  The wound was irrigated with a liter of vancomycin irrigation.  Hemostasis was achieved.  A white wound VAC sponge was placed inferiorly into the space, which tunneled inferiorly approximately 2 cm.  A medium sized black wound VAC sponge was then placed  over the upper aspect of the wound from top to bottom filling the space underneath the sternal notch as well.  Over the wound VAC sponges, the sterile sheeting was applied and the suction line was applied to that and connected to the Pleur-Evac pump.   The patient was reversed from anesthesia, was able to resume spontaneous breathing and was transferred to the recovery room in stable condition.  TN/NUANCE  D:03/29/2019 T:03/29/2019 JOB:[id number]

## 2019-03-29 NOTE — Progress Notes (Signed)
Pre Procedure note for inpatients:   [name] [name] has been scheduled for Procedure(s): SUPERFICIAL STERNAL WOUND DEBRIDEMENT (N/A) WOUND VAC CHANGE (N/A) today. The various methods of treatment have been discussed with the patient. After consideration of the risks, benefits and treatment options the patient has consented to the planned procedure.   The patient has been seen and labs reviewed. There are no changes in the patient's condition to prevent proceeding with the planned procedure today.  Recent labs:  Lab Results  Component Value Date   WBC 9.4 03/29/2019   HGB 9.0 (L) 03/29/2019   HCT 30.9 (L) 03/29/2019   PLT 509 (H) 03/29/2019   GLUCOSE 102 (H) 03/29/2019   CHOL 156 02/23/2019   TRIG 101 02/23/2019   HDL 31 (L) 02/23/2019   LDLCALC 105 (H) 02/23/2019   ALT 29 03/18/2019   AST 19 03/18/2019   NA 138 03/29/2019   K 3.8 03/29/2019   CL 98 03/29/2019   CREATININE 0.85 03/29/2019   BUN 22 (H) 03/29/2019   CO2 33 (H) 03/29/2019   TSH 4.862 (H) 02/27/2019   INR 1.2 03/21/2019   HGBA1C 6.0 (H) 02/23/2019    Len Childs, MD 03/29/2019 7:51 AM

## 2019-03-29 NOTE — Anesthesia Postprocedure Evaluation (Signed)
Anesthesia Post Note  Patient: TETSUO COPPOLA  Procedure(s) Performed: SUPERFICIAL STERNAL WOUND DEBRIDEMENT (N/A Chest) WOUND VAC CHANGE (N/A Chest)     Patient location during evaluation: PACU Anesthesia Type: General Level of consciousness: awake and alert Pain management: pain level controlled Vital Signs Assessment: post-procedure vital signs reviewed and stable Respiratory status: spontaneous breathing, nonlabored ventilation, respiratory function stable and patient connected to nasal cannula oxygen Cardiovascular status: blood pressure returned to baseline and stable Postop Assessment: no apparent nausea or vomiting Anesthetic complications: no    Last Vitals:  Vitals:   03/29/19 1719 03/29/19 1800  BP: 130/72   Pulse: 78 73  Resp: 16 18  Temp:    SpO2: 97% 96%    Last Pain:  Vitals:   03/29/19 2017  TempSrc:   PainSc: 10-Worst pain ever                 Dionisios Ricci COKER

## 2019-03-29 NOTE — Progress Notes (Signed)
[facility] will sign off.  Will follow from a distance. Medication Recommendations:  Colchicine 0.6 mg daily for another 2.5 months, amiodarone reduce to 200 mg daily starting April 4, carvedilol 25 mg twice daily, losartan 100 mg daily. Other recommendations (labs, testing, etc):  Repeat limited echo in 4-6 weeks Follow up as an outpatient:  Please call at DC to organize follow up.

## 2019-03-29 NOTE — Brief Op Note (Addendum)
03/29/2019  8:56 AM  PATIENT:  [name] [name]  58 y.o. male  PRE-OPERATIVE DIAGNOSIS:  STERNAL WOUND INFECTION  POST-OPERATIVE DIAGNOSIS:  STERNAL WOUND INFECTION  PROCEDURE:  Procedure(s): SUPERFICIAL STERNAL WOUND DEBRIDEMENT (N/A) WOUND VAC CHANGE (N/A)  SURGEON:  Surgeon(s) and Role:    Ivin Poot, MD - Primary  PHYSICIAN ASSISTANT: Roddenberry  ANESTHESIA:   general  EBL:  200 mL   BLOOD ADMINISTERED:1 unit PRBC  DRAINS: Wound vac to Sternal wound   LOCAL MEDICATIONS USED:  NONE  SPECIMEN:  No Specimen  DISPOSITION OF SPECIMEN:  N/A  COUNTS:  YES  DICTATION: .Dragon Dictation  PLAN OF CARE: Admit to inpatient   PATIENT DISPOSITION:  PACU - hemodynamically stable.   Delay start of Pharmacological VTE agent (>24hrs) due to surgical blood loss or risk of bleeding: yes  Operative findings: The patient's upper 3 sternal wires had cut through the sternal bone and were removed leaving  An upper sternal defect down to the pericardial fat which is covering the aortic graft. Patient will probably need muscle flap coverage of at least the upper portion of the sternal wound.  I reviewed the patient with Dr. Marla Roe and she will try to examine the wound at the next wound VAC change for possible muscle flap sternal closure early next week.  Gram stain of wound today shows rare white cells [mononuclear] no organisms.  Situation for further surgery discussed with both the patient as well as his wife over the phone.  We will keep the patient in ICU because of separation of upper sternum. [name] MD

## 2019-03-29 NOTE — Anesthesia Preprocedure Evaluation (Addendum)
Anesthesia Evaluation  Patient identified by MRN, date of birth, ID band Patient awake    Reviewed: Allergy & Precautions, NPO status , Patient's Chart, lab work & pertinent test results  Airway Mallampati: II  TM Distance: >3 FB Neck ROM: full    Dental  (+) Edentulous Upper, Upper Dentures, Dental Advidsory Given   Pulmonary asthma , COPD, former smoker,     + decreased breath sounds      Cardiovascular hypertension, + CAD and + Past MI   Rhythm:Regular Rate:Normal     Neuro/Psych PSYCHIATRIC DISORDERS Depression TIACVA, No Residual Symptoms    GI/Hepatic GERD  Medicated and Controlled,  Endo/Other    Renal/GU      Musculoskeletal   Abdominal   Peds  Hematology  (+) Blood dyscrasia, anemia ,   Anesthesia Other Findings Left Ventricle: The left ventricle has low normal systolic function, with an ejection fraction of 50-55%. The cavity size was normal. There is no increase in left ventricular wall thickness  Reproductive/Obstetrics                            Anesthesia Physical Anesthesia Plan  ASA: III  Anesthesia Plan: General   Post-op Pain Management:    Induction: Intravenous  PONV Risk Score and Plan: 1 and Ondansetron  Airway Management Planned: Oral ETT  Additional Equipment:   Intra-op Plan:   Post-operative Plan: Possible Post-op intubation/ventilation  Informed Consent: I have reviewed the patients History and Physical, chart, labs and discussed the procedure including the risks, benefits and alternatives for the proposed anesthesia with the patient or authorized representative who has indicated his/her understanding and acceptance.     Dental Advisory Given  Plan Discussed with: Anesthesiologist and CRNA  Anesthesia Plan Comments:        Anesthesia Quick Evaluation

## 2019-03-29 NOTE — Progress Notes (Signed)
[facility] BRIEF SICU PROGRESS NOTE  Day of Surgery  S/P Procedure(s) (LRB): SUPERFICIAL STERNAL WOUND DEBRIDEMENT (N/A) WOUND VAC CHANGE (N/A)   Resting comfortably in ICU Minimal pain NSR w/ stable BP Breathing comfortably on room air  Plan: Continue current plan  Rexene Alberts, MD 03/29/2019 5:20 PM

## 2019-03-29 NOTE — Progress Notes (Signed)
OR tech here to transport patient to OR. Vanc infusing at present via single lumen picc line. Wound vac dressing intact with minimal serosanguinous output. Has been NPO since Sandy. Prn pain meds given. Rates 7/10 at present. Monitor on standby.

## 2019-03-30 ENCOUNTER — Encounter (HOSPITAL_COMMUNITY): Payer: Self-pay | Admitting: Cardiothoracic Surgery

## 2019-03-30 ENCOUNTER — Inpatient Hospital Stay (HOSPITAL_COMMUNITY): Payer: Medicare Other

## 2019-03-30 LAB — CBC
HCT: 30.5 % — ABNORMAL LOW (ref 39.0–52.0)
Hemoglobin: 9 g/dL — ABNORMAL LOW (ref 13.0–17.0)
MCH: 26.7 pg (ref 26.0–34.0)
MCHC: 29.5 g/dL — ABNORMAL LOW (ref 30.0–36.0)
MCV: 90.5 fL (ref 80.0–100.0)
Platelets: 468 10*3/uL — ABNORMAL HIGH (ref 150–400)
RBC: 3.37 MIL/uL — ABNORMAL LOW (ref 4.22–5.81)
RDW: 14.6 % (ref 11.5–15.5)
WBC: 10.5 10*3/uL (ref 4.0–10.5)
nRBC: 0 % (ref 0.0–0.2)

## 2019-03-30 LAB — BASIC METABOLIC PANEL
Anion gap: 7 (ref 5–15)
BUN: 13 mg/dL (ref 6–20)
CO2: 26 mmol/L (ref 22–32)
Calcium: 9.6 mg/dL (ref 8.9–10.3)
Chloride: 103 mmol/L (ref 98–111)
Creatinine, Ser: 0.82 mg/dL (ref 0.61–1.24)
GFR calc Af Amer: >60 mL/min (ref >60)
GFR calc non Af Amer: >60 mL/min (ref >60)
Glucose, Bld: 99 mg/dL (ref 70–99)
Potassium: 4.3 mmol/L (ref 3.5–5.1)
Sodium: 136 mmol/L (ref 135–145)

## 2019-03-30 LAB — VANCOMYCIN, TROUGH: Vancomycin Tr: 15 ug/mL (ref 15–20)

## 2019-03-30 LAB — VANCOMYCIN, PEAK: Vancomycin Pk: 28 ug/mL — ABNORMAL LOW (ref 30–40)

## 2019-03-30 MED ORDER — VANCOMYCIN HCL 10 G IV SOLR
1250.0000 mg | Freq: Two times a day (BID) | INTRAVENOUS | Status: DC
  Administered 2019-03-30 – 2019-03-31 (×2): 1250 mg via INTRAVENOUS
  Administered 2019-03-31: 1000 mg via INTRAVENOUS
  Administered 2019-04-01 – 2019-04-05 (×10): 1250 mg via INTRAVENOUS
  Filled 2019-03-30 (×17): qty 1250

## 2019-03-30 MED ORDER — VANCOMYCIN HCL 1000 MG IV SOLR
INTRAVENOUS | Status: AC
  Administered 2019-03-31: 1000 mL
  Filled 2019-03-30: qty 1000

## 2019-03-30 MED ORDER — PRO-STAT SUGAR FREE PO LIQD
30.0000 mL | Freq: Two times a day (BID) | ORAL | Status: DC
  Administered 2019-03-30 – 2019-04-07 (×12): 30 mL via ORAL
  Filled 2019-03-30 (×14): qty 30

## 2019-03-30 MED ORDER — CARVEDILOL 12.5 MG PO TABS
12.5000 mg | ORAL_TABLET | Freq: Two times a day (BID) | ORAL | Status: DC
  Administered 2019-03-30 – 2019-04-07 (×15): 12.5 mg via ORAL
  Filled 2019-03-30 (×15): qty 1

## 2019-03-30 MED ORDER — AMIODARONE HCL 200 MG PO TABS
200.0000 mg | ORAL_TABLET | Freq: Every day | ORAL | Status: DC
  Administered 2019-03-31 – 2019-04-02 (×3): 200 mg via ORAL
  Filled 2019-03-30 (×3): qty 1

## 2019-03-30 MED ORDER — HYDROMORPHONE HCL 1 MG/ML IJ SOLN
1.0000 mg | INTRAMUSCULAR | Status: DC | PRN
  Administered 2019-03-30 – 2019-04-02 (×10): 1 mg via INTRAVENOUS
  Filled 2019-03-30 (×11): qty 1

## 2019-03-30 NOTE — Progress Notes (Signed)
Patient up in chair for 3 hours and tolerated well. Returned to bed after lunch today.  Reviewed need for SCD's and patient compliant with using while up in chair with legs elevated. Requested time for SCD's to be off when getting back to bed.  Patient verbalizing plan of care after reviewed with Dr. Nils Pyle. Pain under control at this time, resting quietly in bed verbalized no needs at this time.

## 2019-03-30 NOTE — Progress Notes (Addendum)
1 Day Post-Op Procedure(s) (LRB): SUPERFICIAL STERNAL WOUND DEBRIDEMENT (N/A) WOUND VAC CHANGE (N/A) Subjective: Awake and alert. Rested well and reports less pain in his chest. RN reports a few episodes of bradycardia while sleeping.  Objective: Vital signs in last 24 hours: Temp:  [97.3 F (36.3 C)-99 F (37.2 C)] 98.5 F (36.9 C) (04/01 0400) Pulse Rate:  [66-83] 66 (04/01 0000) Cardiac Rhythm: Normal sinus rhythm (03/31 2000) Resp:  [12-20] 16 (04/01 0000) BP: (98-151)/(60-87) 128/60 (04/01 0000) SpO2:  [94 %-100 %] 94 % (04/01 0000) Weight:  [98.1 kg] 98.1 kg (04/01 0500)     Intake/Output from previous day: 03/31 0701 - 04/01 0700 In: 4050.2 [P.O.:960; I.V.:814.5; Blood:315; IV Piggyback:1960.8] Out: 7793 [PSUGA:4847; Drains:80; Blood:300] Intake/Output this shift: No intake/output data recorded.  General appearance: alert, cooperative and no distress Neurologic: intact Heart: regular rate and rhythm Lungs: Breath sounds clear. CXR iwithout acute change.  Wound: Wound vac properly compressed, had 61m serosanguinous drainage overnight.   Lab Results: Recent Labs    03/29/19 0415 03/30/19 0433  WBC 9.4 10.5  HGB 9.0* 9.0*  HCT 30.9* 30.5*  PLT 509* 468*   BMET:  Recent Labs    03/29/19 0415 03/30/19 0433  NA 138 136  K 3.8 4.3  CL 98 103  CO2 33* 26  GLUCOSE 102* 99  BUN 22* 13  CREATININE 0.85 0.82  CALCIUM 9.6 9.6    PT/INR: No results for input(s): LABPROT, INR in the last 72 hours. ABG    Component Value Date/Time   PHART 7.451 (H) 03/18/2019 0329   HCO3 24.1 03/18/2019 0329   TCO2 25 03/18/2019 0329   ACIDBASEDEF 3.0 (H) 02/24/2019 0155   O2SAT 91.0 03/18/2019 0329   CBG (last 3)  No results for input(s): GLUCAP in the last 72 hours.  Assessment/Plan: S/P Procedure(s) (LRB): SUPERFICIAL STERNAL WOUND DEBRIDEMENT (N/A) WOUND VAC CHANGE (N/A)  -POD8 initial sternal wound dibridement for superficial wound infection with subsequent  return to the OR x 2 for further dibridement and vac change. The four upper-most sternal wires were removed in OR yesterday after they were found to have pulled through. Dr. VPrescott Gumdiscussing with plastic surgery plan for eventual closure with muscle flap  Wound Cx's remain negative. Continuing vancomycin, Cipro, and vac therapy for now.  -History of Afib- maintaining SR on amiodarone, adjusted to 201mPO daily by cardiology.  -Chronic diastolic heart failure- appreciate assistance from cardiology. Appears euvolemic now on torsemide and spirinolactone.   -DVT PPX--Avoid Lovenox since sternum is partially open. Continue SCD's  -Expected acute blood loss anemia- transfused 1 unit PRBC's in OR. Hct stable, minimal output from wound vac overnight.  Continue iron supplement.   LOS: 12 days    MyAntony [phone] 03/30/2019 Patient developed secondary heart block while asleep heart rate less than 40.. Marland Kitchenatient was receiving both amiodarone and carvedilol.  Will reduce amiodarone and carvedilol dosing and follow heart rate.  Plan wound VAC change and wound irrigation in the OR tomorrow a.m.  Procedure discussed with patient.  patient examined and medical record reviewed,agree with above note. PeTharon Aquasrigt III 03/30/2019

## 2019-03-30 NOTE — Progress Notes (Signed)
Pt's HR dropping in the upper 30's non sustaining , pt sleeping, asymptomatic. HR goes up to 60-70's when awake. Charge Nurse Trilby Drummer is aware.

## 2019-03-30 NOTE — Progress Notes (Signed)
Pharmacy Antibiotic Note  [name] [name] is a 58 y.o. male admitted on 03/18/2019 with sepsis.  New purulent drainage noted from sternal wound, to OR for debridement and VAC placement on 3/24, underwent woudn VAC change 3/31.  Currently on day#13 of antibiotics. Vancomycin peak came back at 28 with trough resulting at 15. Scr stable at 0.82. WBC WNL. Afebrile. AUC came back at 576.   Plan: Reduce dose to 1250 mg IV every 12 hours for expected AUC of 479.  Continue current cipro dosing Monitor renal fx, clinical pic, and vancomycin levels as appropriate  Height: 5' 8"  (172.7 cm) Weight: 216 lb 4.3 oz (98.1 kg) IBW/kg (Calculated) : 68.4  Temp (24hrs), Avg:98.3 F (36.8 C), Min:97.9 F (36.6 C), Max:98.8 F (37.1 C)  Recent Labs  Lab 03/24/19 0211 03/24/19 0648 03/24/19 1655 03/25/19 0500 03/26/19 0329 03/28/19 0837 03/29/19 0415 03/30/19 0433 03/30/19 1108 03/30/19 1829  WBC 8.1  --   --  9.8 9.3  --  9.4 10.5  --   --   CREATININE  --   --   --  0.82 0.78 0.84 0.85 0.82  --   --   VANCOTROUGH  --   --  13*  --   --   --   --   --   --  15  VANCOPEAK  --  34  --   --   --   --   --   --  28*  --     Estimated Creatinine Clearance: 111.5 mL/min (by C-G formula based on SCr of 0.82 mg/dL).    Allergies  Allergen Reactions  . Penicillins Hives, Rash and Other (See Comments)    Did it involve swelling of the face/tongue/throat, SOB, or low BP? Yes Did it involve sudden or severe rash/hives, skin peeling, or any reaction on the inside of your mouth or nose? Yes  Did you need to seek medical attention at a hospital or doctor's office? Yes When did it last happen?As a child. If all above answers are "NO", may proceed with cephalosporin use.    Antimicrobials this admission: Vanc 3/20 >>    Aztreonam 3/20>>3/27 Cipro 3/27> Flagyl 3/20 >> 3/22  Antibiotic Adjustments This Admission:  4/1: VP 28, VT 15, AUC 576 - reduce to 1250 mg IV q12 (estAUC  479)  Microbiology results: 3/24 Wound cx: ngtd 3/20 BCx: ng 3/20 UCx:  ng RSV panel: neg MRSA PCR: neg   Thank you for allowing pharmacy to be a part of this patient's care.  Antonietta Jewel, PharmD, [place] Clinical Pharmacist  Pager: [phone] Phone: [phone] 03/30/2019 7:17 PM

## 2019-03-30 NOTE — Progress Notes (Signed)
CT surgery p.m. Rounds  No episodes of bradycardia today Patient up in chair without difficulty Minimal chest wall pain Minimal back drainage today Plan sternal wound exploration with wound VAC change in a.m. tomorrow-orders in place.

## 2019-03-30 NOTE — Progress Notes (Signed)
Nutrition Follow-up  DOCUMENTATION CODES:   Obesity unspecified  INTERVENTION:   D/C Ensure MAX D/C Boost Plus   Continue Juven Fruit Punch BID, each serving provides 95kcal and 2.5g of protein (amino acids glutamine and arginine)  Add 30 ml Prostat BID, each supplement provides 100 kcals and 15 grams protein  Add Magic cup TID with meals, each supplement provides 290 kcal and 9 grams of protein  NUTRITION DIAGNOSIS:   Increased nutrient needs related to wound healing as evidenced by estimated needs.  Ongoing  GOAL:   Patient will meet greater than or equal to 90% of their needs  Progressing   MONITOR:   PO intake, Supplement acceptance, Diet advancement, Skin, Labs, I & O's  REASON FOR ASSESSMENT:   Other (Comment)(documented wound)    ASSESSMENT:   58 yo male, admitted with chest pain, SOB. PMH significant for COPD, PAF, CAD, HTN, HLD, GERD, h/o colonic polyps, h/o MI, PVD, pre-diabetes, NASH. Recent admission for [facility] with PCI to 42% RCA lesion complicated by repair of Type A dissection with single vessel RCA CABG (with SVG); discharged home.   3/20- admitted  3/24- I&D sternal wound, application wound vac 5/95- wound vac change 3/31- sternal wound debridement, wound vac change  RD working remotely.  Pt unable to speak on the phone. RN reports pt has great appetite. Meal completions charted as 100% for his last two meals. He is compliant with Juven BID but does not like Ensure or Boost. Pt aware of increased protein needs s/p surgery. Will attempt to add Prostat and Magic Cups.   Weight noted to decrease from 103.2 kg on 3/27 to 98.1 kg today. Will continue to monitor trends.   I/O: -17.7 L since admit  UOP: 4465 ml x 24 hrs  Wound VAC: 80 ml x 24 hrs   Medications reviewed and include: [id number]-PPI c-folic acid, 20 mEq KCl BID, senokot, 40 mg demadex once daily, zinc sulfate Labs reviewed: CBG 99-109  Diet Order:   Diet Order            Diet NPO time  specified Except for: Sips with Meds  Diet effective midnight        Diet Heart Room service appropriate? Yes; Fluid consistency: Thin  Diet effective 0500              EDUCATION NEEDS:   No education needs have been identified at this time  Skin:  Skin Assessment: Skin Integrity Issues: Skin Integrity Issues:: Incisions, Wound VAC, Other (Comment) Wound Vac: sternal abcess Incisions: chest Other: ecchymosis - L chest, R leg; skin tear - R upper back  Last BM:  3/31  Height:   Ht Readings from Last 1 Encounters:  03/25/19 5' 8"  (1.727 m)    Weight:   Wt Readings from Last 1 Encounters:  03/30/19 98.1 kg    Ideal Body Weight:  70 kg  BMI:  Body mass index is 32.88 kg/m.  Estimated Nutritional Needs:   Kcal:  2100-2450 (30-35 kcal/kg)  Protein:  105-126 (1.5-1.8 g/kg)  Fluid:  >/= 1.5 L daily or per MD   Mariana Single RD, LDN Clinical Nutrition Pager # [phone]

## 2019-03-30 NOTE — Progress Notes (Signed)
Pt was on 2nd degree type 2 heart block at that time pt's HR in the 30's. BP 117/68, 96% O2 sat on room air. Pt asymptomatic. Dr. Roxy Manns notified. No new orders given.

## 2019-03-30 NOTE — Progress Notes (Signed)
Occupational Therapy Treatment Patient Details Name: [name] [name] MRN: [id number] DOB: July 10, 1961 Today's Date: 03/30/2019    History of present illness [name] is a 58 y.o. male with medical history significant of COPD, PAF on amiodarone, not on oral anticoagulation, CAD, admitted for unstable angina at end of Feb, underwent LHC with PCI to 90% RCA lesion, unfortunately this ended up being complicated by type A aortic dissection. Went home, then presented with 3 day history of SOB. Pt found to have sepsis due to sternal abscess. Pt underwent sternal debridement and VAC placement on 03/22/19, 03/25/19, 03/29/19.    OT comments  Patient pleasant and cooperative.  OOB in recliner upon entry.  Setup assist for using urinal, mgmt for lines only.  Reviewed HEP initiated last session (for R hand), pt reports not completing since initiated.  Pt able to complete with verbal cueing, specifically to keep elbow at side; completed putty exercises in lap to avoid shoulder abduction.  Noted increased difficulty with flexion at DIPs of R hand, plan to work on tendon glides and provide sternal precaution handout next session.    Follow Up Recommendations  Outpatient OT;Supervision - Intermittent    Equipment Recommendations  None recommended by OT    Recommendations for Other Services      Precautions / Restrictions Precautions Precautions: Sternal Precaution Booklet Issued: (verbally reviewed with patient, needs handout) Precaution Comments: wound vac Restrictions Weight Bearing Restrictions: No Other Position/Activity Restrictions: sternal precautions       Mobility Bed Mobility               General bed mobility comments: OOB in chair upon entry  Transfers                      Balance Overall balance assessment: Needs assistance Sitting-balance support: Feet supported;No upper extremity supported Sitting balance-Leahy Scale: Good                                     ADL either performed or assessed with clinical judgement   ADL Overall ADL's : Needs assistance/impaired                             Toileting- Clothing Manipulation and Hygiene: Set up;Sitting/lateral lean Toileting - Clothing Manipulation Details (indicate cue type and reason): setup to use urinal in reclinner        General ADL Comments: session focused on sternal precautions and review of exercises for R hand      Vision       Perception     Praxis      Cognition Arousal/Alertness: Awake/alert Behavior During Therapy: WFL for tasks assessed/performed Overall Cognitive Status: Within Functional Limits for tasks assessed                                 General Comments: poor adherance to sternal precautions         Exercises Exercises: Other exercises Other Exercises Other Exercises: R hand tendon gliding ex Other Exercises: theraputty (yellow) digit flexion pressing against table Other Exercises: thumb 1st finger pinch against theraputty (yellow) Other Exercises: intrinsic hand strengthening with yellow theraputty Other Exercises: digit AB/AD duction with rubber bands (with and without thumb)   Shoulder Instructions  General Comments      Pertinent Vitals/ Pain       Pain Assessment: 0-10 Pain Score: 9  Pain Location: sternal Pain Descriptors / Indicators: Operative site guarding Pain Intervention(s): Limited activity within patient's tolerance;Monitored during session  Home Living                                          Prior Functioning/Environment              Frequency  Min 3X/week        Progress Toward Goals  OT Goals(current goals can now be found in the care plan section)  Progress towards OT goals: Progressing toward goals  Acute Rehab OT Goals Patient Stated Goal: to be able to get home and feel better OT Goal Formulation: With patient Time For Goal  Achievement: 04/07/19 Potential to Achieve Goals: Good  Plan Discharge plan remains appropriate    Co-evaluation                 AM-PAC OT "6 Clicks" Daily Activity     Outcome Measure   Help from another person eating meals?: A Little Help from another person taking care of personal grooming?: A Little Help from another person toileting, which includes using toliet, bedpan, or urinal?: A Little Help from another person bathing (including washing, rinsing, drying)?: A Little Help from another person to put on and taking off regular upper body clothing?: A Little Help from another person to put on and taking off regular lower body clothing?: A Little 6 Click Score: 18    End of Session    OT Visit Diagnosis: Muscle weakness (generalized) (M62.81);Pain;Unsteadiness on feet (R26.81) Pain - part of body: (sternum)   Activity Tolerance Patient tolerated treatment well   Patient Left in chair;with call bell/phone within reach;with nursing/sitter in room   Nurse Communication Mobility status        Time: 9702-6378 OT Time Calculation (min): 26 min  Charges: OT General Charges $OT Visit: 1 Visit OT Treatments $Self Care/Home Management : 8-22 mins $Therapeutic Exercise: 8-22 mins  Delight Stare, [place] Pager [phone] Office [phone]    Delight Stare 03/30/2019, 11:48 AM

## 2019-03-31 ENCOUNTER — Inpatient Hospital Stay (HOSPITAL_COMMUNITY): Payer: Medicare Other | Admitting: Certified Registered"

## 2019-03-31 ENCOUNTER — Encounter (HOSPITAL_COMMUNITY): Payer: Self-pay | Admitting: Certified Registered Nurse Anesthetist

## 2019-03-31 ENCOUNTER — Encounter (HOSPITAL_COMMUNITY)
Admission: EM | Disposition: A | Discharge status: Home Health | Payer: Self-pay | Source: Home / Self Care | Attending: Cardiothoracic Surgery

## 2019-03-31 DIAGNOSIS — T8142XA Infection following a procedure, deep incisional surgical site, initial encounter: Secondary | ICD-10-CM

## 2019-03-31 HISTORY — PX: APPLICATION OF WOUND VAC: SHX5189

## 2019-03-31 HISTORY — PX: STERNAL WOUND DEBRIDEMENT: SHX1058

## 2019-03-31 LAB — COMPREHENSIVE METABOLIC PANEL
ALT: 20 U/L (ref 0–44)
AST: 21 U/L (ref 15–41)
Albumin: 3 g/dL — ABNORMAL LOW (ref 3.5–5.0)
Alkaline Phosphatase: 91 U/L (ref 38–126)
Anion gap: 10 (ref 5–15)
BUN: 22 mg/dL — ABNORMAL HIGH (ref 6–20)
CO2: 26 mmol/L (ref 22–32)
Calcium: 9.4 mg/dL (ref 8.9–10.3)
Chloride: 98 mmol/L (ref 98–111)
Creatinine, Ser: 1.07 mg/dL (ref 0.61–1.24)
GFR calc Af Amer: >60 mL/min (ref >60)
GFR calc non Af Amer: >60 mL/min (ref >60)
Glucose, Bld: 101 mg/dL — ABNORMAL HIGH (ref 70–99)
Potassium: 3.9 mmol/L (ref 3.5–5.1)
Sodium: 134 mmol/L — ABNORMAL LOW (ref 135–145)
Total Bilirubin: 0.7 mg/dL (ref 0.3–1.2)
Total Protein: 6.9 g/dL (ref 6.5–8.1)

## 2019-03-31 LAB — CBC
HCT: 32.8 % — ABNORMAL LOW (ref 39.0–52.0)
Hemoglobin: 9.6 g/dL — ABNORMAL LOW (ref 13.0–17.0)
MCH: 26.2 pg (ref 26.0–34.0)
MCHC: 29.3 g/dL — ABNORMAL LOW (ref 30.0–36.0)
MCV: 89.6 fL (ref 80.0–100.0)
Platelets: 438 10*3/uL — ABNORMAL HIGH (ref 150–400)
RBC: 3.66 MIL/uL — ABNORMAL LOW (ref 4.22–5.81)
RDW: 15 % (ref 11.5–15.5)
WBC: 10.3 10*3/uL (ref 4.0–10.5)
nRBC: 0 % (ref 0.0–0.2)

## 2019-03-31 SURGERY — DEBRIDEMENT, WOUND, STERNUM
Anesthesia: General

## 2019-03-31 MED ORDER — MIDAZOLAM HCL 2 MG/2ML IJ SOLN
INTRAMUSCULAR | Status: AC
  Filled 2019-03-31: qty 2

## 2019-03-31 MED ORDER — SODIUM CHLORIDE 0.9 % IR SOLN
Status: DC | PRN
  Administered 2019-03-31: 1000 mL

## 2019-03-31 MED ORDER — PROPOFOL 10 MG/ML IV BOLUS
INTRAVENOUS | Status: AC
  Filled 2019-03-31: qty 20

## 2019-03-31 MED ORDER — TORSEMIDE 20 MG PO TABS: 20.0000 mg | ORAL_TABLET | Freq: Every day | ORAL | Status: DC

## 2019-03-31 MED ORDER — LIDOCAINE 2% (20 MG/ML) 5 ML SYRINGE
INTRAMUSCULAR | Status: DC | PRN
  Administered 2019-03-31: 80 mg via INTRAVENOUS

## 2019-03-31 MED ORDER — PROPOFOL 10 MG/ML IV BOLUS
INTRAVENOUS | Status: DC | PRN
  Administered 2019-03-31: 100 mg via INTRAVENOUS

## 2019-03-31 MED ORDER — LACTATED RINGERS IV SOLN
INTRAVENOUS | Status: DC | PRN
  Administered 2019-03-31: 07:00:00 via INTRAVENOUS

## 2019-03-31 MED ORDER — DEXAMETHASONE SODIUM PHOSPHATE 10 MG/ML IJ SOLN
INTRAMUSCULAR | Status: DC | PRN
  Administered 2019-03-31: 5 mg via INTRAVENOUS

## 2019-03-31 MED ORDER — FENTANYL CITRATE (PF) 250 MCG/5ML IJ SOLN
INTRAMUSCULAR | Status: AC
  Filled 2019-03-31: qty 5

## 2019-03-31 MED ORDER — MIDAZOLAM HCL 2 MG/2ML IJ SOLN
INTRAMUSCULAR | Status: DC | PRN
  Administered 2019-03-31: 2 mg via INTRAVENOUS

## 2019-03-31 MED ORDER — FENTANYL CITRATE (PF) 250 MCG/5ML IJ SOLN
INTRAMUSCULAR | Status: DC | PRN
  Administered 2019-03-31 (×5): 50 ug via INTRAVENOUS

## 2019-03-31 MED ORDER — VANCOMYCIN HCL IN DEXTROSE 1-5 GM/200ML-% IV SOLN
INTRAVENOUS | Status: AC
  Filled 2019-03-31: qty 200

## 2019-03-31 SURGICAL SUPPLY — 72 items
ATTRACTOMAT 16X20 MAGNETIC DRP (DRAPES) ×3 IMPLANT
BAG DECANTER FOR FLEXI CONT (MISCELLANEOUS) ×3 IMPLANT
BENZOIN TINCTURE PRP APPL 2/3 (GAUZE/BANDAGES/DRESSINGS) IMPLANT
BINDER BREAST 3XL (GAUZE/BANDAGES/DRESSINGS) ×3 IMPLANT
BLADE SURG 10 STRL SS (BLADE) ×3 IMPLANT
BLADE SURG 15 STRL LF DISP TIS (BLADE) IMPLANT
BLADE SURG 15 STRL SS (BLADE)
BNDG GAUZE ELAST 4 BULKY (GAUZE/BANDAGES/DRESSINGS) IMPLANT
CANISTER SUCT 3000ML PPV (MISCELLANEOUS) ×3 IMPLANT
CANISTER WOUNDNEG PRESSURE 500 (CANNISTER) ×3 IMPLANT
CATH FOLEY 2WAY SLVR  5CC 16FR (CATHETERS)
CATH FOLEY 2WAY SLVR 5CC 16FR (CATHETERS) IMPLANT
CATH THORACIC 28FR RT ANG (CATHETERS) IMPLANT
CATH THORACIC 36FR (CATHETERS) IMPLANT
CATH THORACIC 36FR RT ANG (CATHETERS) IMPLANT
CLIP VESOCCLUDE SM WIDE 24/CT (CLIP) IMPLANT
CONN Y 3/8X3/8X3/8  BEN (MISCELLANEOUS)
CONN Y 3/8X3/8X3/8 BEN (MISCELLANEOUS) IMPLANT
CONT SPEC 4OZ CLIKSEAL STRL BL (MISCELLANEOUS) IMPLANT
COVER SURGICAL LIGHT HANDLE (MISCELLANEOUS) ×6 IMPLANT
COVER WAND RF STERILE (DRAPES) ×3 IMPLANT
DRAPE LAPAROSCOPIC ABDOMINAL (DRAPES) ×3 IMPLANT
DRAPE SLUSH/WARMER DISC (DRAPES) IMPLANT
DRAPE WARM FLUID 44X44 (DRAPE) IMPLANT
DRSG AQUACEL AG ADV 3.5X14 (GAUZE/BANDAGES/DRESSINGS) ×3 IMPLANT
DRSG CUTIMED SORBACT 7X9 (GAUZE/BANDAGES/DRESSINGS) ×3 IMPLANT
DRSG PAD ABDOMINAL 8X10 ST (GAUZE/BANDAGES/DRESSINGS) ×6 IMPLANT
DRSG VAC ATS LRG SENSATRAC (GAUZE/BANDAGES/DRESSINGS) ×3 IMPLANT
DRSG VAC ATS MED SENSATRAC (GAUZE/BANDAGES/DRESSINGS) ×3 IMPLANT
DRSG VAC ATS SM SENSATRAC (GAUZE/BANDAGES/DRESSINGS) ×3 IMPLANT
DRSG VERSA FOAM LRG 10X15 (GAUZE/BANDAGES/DRESSINGS) ×3 IMPLANT
ELECT REM PT RETURN 9FT ADLT (ELECTROSURGICAL) ×3
ELECTRODE REM PT RTRN 9FT ADLT (ELECTROSURGICAL) ×1 IMPLANT
GAUZE SPONGE 4X4 12PLY STRL (GAUZE/BANDAGES/DRESSINGS) ×3 IMPLANT
GAUZE XEROFORM 5X9 LF (GAUZE/BANDAGES/DRESSINGS) IMPLANT
GLOVE BIO SURGEON STRL SZ7.5 (GLOVE) ×6 IMPLANT
GOWN STRL REUS W/ TWL LRG LVL3 (GOWN DISPOSABLE) ×4 IMPLANT
GOWN STRL REUS W/TWL LRG LVL3 (GOWN DISPOSABLE) ×8
HANDPIECE INTERPULSE COAX TIP (DISPOSABLE) ×2
HEMOSTAT POWDER SURGIFOAM 1G (HEMOSTASIS) IMPLANT
HEMOSTAT SURGICEL 2X14 (HEMOSTASIS) IMPLANT
KIT BASIN OR (CUSTOM PROCEDURE TRAY) ×3 IMPLANT
KIT SUCTION CATH 14FR (SUCTIONS) IMPLANT
KIT TURNOVER KIT B (KITS) ×3 IMPLANT
MICROMATRIX 1000MG (Tissue) ×3 IMPLANT
NS IRRIG 1000ML POUR BTL (IV SOLUTION) ×3 IMPLANT
PACK CHEST (CUSTOM PROCEDURE TRAY) ×3 IMPLANT
PACK GENERAL/GYN (CUSTOM PROCEDURE TRAY) ×3 IMPLANT
PAD ARMBOARD 7.5X6 YLW CONV (MISCELLANEOUS) ×6 IMPLANT
SET ADHESIVE SKIN CLSR ABRA (MISCELLANEOUS) ×12 IMPLANT
SET HNDPC FAN SPRY TIP SCT (DISPOSABLE) ×1 IMPLANT
SOL PREP POV-IOD 4OZ 10% (MISCELLANEOUS) IMPLANT
SOLUTION PARTIC MCRMTRX 1000MG (Tissue) ×1 IMPLANT
SPONGE LAP 18X18 RF (DISPOSABLE) ×3 IMPLANT
STAPLER VISISTAT 35W (STAPLE) IMPLANT
STRAP MONTGOMERY 1.25X11-1/8 (MISCELLANEOUS) IMPLANT
SUT ETHILON 3 0 FSL (SUTURE) IMPLANT
SUT STEEL 6MS V (SUTURE) IMPLANT
SUT STEEL STERNAL CCS#1 18IN (SUTURE) IMPLANT
SUT STEEL SZ 6 DBL 3X14 BALL (SUTURE) IMPLANT
SUT VIC AB 1 CTX 36 (SUTURE) ×4
SUT VIC AB 1 CTX36XBRD ANBCTR (SUTURE) ×2 IMPLANT
SUT VIC AB 2-0 CTX 27 (SUTURE) ×6 IMPLANT
SUT VIC AB 3-0 X1 27 (SUTURE) ×6 IMPLANT
SWAB COLLECTION DEVICE MRSA (MISCELLANEOUS) IMPLANT
SWAB CULTURE ESWAB REG 1ML (MISCELLANEOUS) IMPLANT
SYR 5ML LL (SYRINGE) IMPLANT
TOWEL GREEN STERILE (TOWEL DISPOSABLE) ×3 IMPLANT
TOWEL GREEN STERILE FF (TOWEL DISPOSABLE) ×3 IMPLANT
TRAY FOLEY MTR SLVR 16FR STAT (SET/KITS/TRAYS/PACK) IMPLANT
WATER STERILE IRR 1000ML POUR (IV SOLUTION) ×3 IMPLANT
WND VAC CANISTER 500ML (MISCELLANEOUS) ×3 IMPLANT

## 2019-03-31 NOTE — Progress Notes (Signed)
Occupational Therapy Treatment Patient Details Name: [name] [name] MRN: [id number] DOB: Aug 10, 1961 Today's Date: 03/31/2019    History of present illness [name] [name] is a 58 y.o. male with medical history significant of COPD, PAF on amiodarone, not on oral anticoagulation, CAD, admitted for unstable angina at end of Feb, underwent LHC with PCI to 90% RCA lesion, unfortunately this ended up being complicated by type A aortic dissection. Went home, then presented with 3 day history of SOB. Pt found to have sepsis due to sternal abscess. Pt underwent sternal debridement and VAC placement on 03/22/19, 03/25/19, 03/29/19.    OT comments  Pt progressing towards OT goals this session. Pt provided with HEP handouts to reinforce R hand exercise protocol. Pt able to demonstrate all exercises - struggles with IP joint movement - but showing improvement in strength and coordination overall. Pt very motivated and diligent. Pt eating lunch at the end of session without needing the red adaptive tubing (self-modified grip on fork). OT will continue to follow acutely.    Follow Up Recommendations  Outpatient OT;Supervision - Intermittent    Equipment Recommendations  None recommended by OT    Recommendations for Other Services      Precautions / Restrictions Precautions Precautions: Sternal Precaution Booklet Issued: (verbally reviewed - Pt needs handout) Precaution Comments: wound vac Restrictions Weight Bearing Restrictions: Yes(Sternal Precautions) Other Position/Activity Restrictions: sternal precautions       Mobility Bed Mobility               General bed mobility comments: OOB in chair upon entry  Transfers                 General transfer comment: NT this session    Balance                                           ADL either performed or assessed with clinical judgement   ADL                                                Vision       Perception     Praxis      Cognition Arousal/Alertness: Awake/alert Behavior During Therapy: WFL for tasks assessed/performed Overall Cognitive Status: Within Functional Limits for tasks assessed                                 General Comments: poor adherance to sternal precautions         Exercises Exercises: Other exercises Other Exercises Other Exercises: R hand tendon gliding ex (provided handout) Other Exercises: theraputty (yellow) digit flexion pressing against table focus on IP joint Other Exercises: thumb 1st finger pinch against theraputty (yellow) Other Exercises: intrinsic hand strengthening with yellow theraputty (handout provided) Other Exercises: digit AB/AD duction with rubber bands (with and without thumb) handout provided   Shoulder Instructions       General Comments      Pertinent Vitals/ Pain       Pain Assessment: Faces Faces Pain Scale: Hurts little more Pain Location: sternal Pain Descriptors / Indicators: Operative site guarding Pain Intervention(s): Monitored during session;Limited activity within patient's tolerance  Home  Living                                          Prior Functioning/Environment              Frequency  Min 3X/week        Progress Toward Goals  OT Goals(current goals can now be found in the care plan section)  Progress towards OT goals: Progressing toward goals  Acute Rehab OT Goals Patient Stated Goal: to be able to get home and feel better OT Goal Formulation: With patient Time For Goal Achievement: 04/07/19 Potential to Achieve Goals: Good  Plan Discharge plan remains appropriate    Co-evaluation                 AM-PAC OT "6 Clicks" Daily Activity     Outcome Measure   Help from another person eating meals?: None Help from another person taking care of personal grooming?: A Little Help from another person toileting, which includes using  toliet, bedpan, or urinal?: A Little Help from another person bathing (including washing, rinsing, drying)?: A Little Help from another person to put on and taking off regular upper body clothing?: A Little Help from another person to put on and taking off regular lower body clothing?: A Little 6 Click Score: 19    End of Session Equipment Utilized During Treatment: Other (comment)(theraputty (yellow) and rubber bands)  OT Visit Diagnosis: Muscle weakness (generalized) (M62.81);Pain;Unsteadiness on feet (R26.81) Pain - part of body: (sternal)   Activity Tolerance Patient tolerated treatment well   Patient Left in chair;with call bell/phone within reach;with nursing/sitter in room   Nurse Communication Mobility status        Time: 1572-6203 OT Time Calculation (min): 20 min  Charges: OT General Charges $OT Visit: 1 Visit OT Treatments $Therapeutic Exercise: 8-22 mins  Hulda Humphrey OTR/L Acute Rehabilitation Services Pager: [phone] Office: [place] 03/31/2019, 12:20 PM

## 2019-03-31 NOTE — Anesthesia Postprocedure Evaluation (Signed)
Anesthesia Post Note  Patient: [name] [name]  Procedure(s) Performed: STERNAL WOUND DEBRIDEMENT USING 1G ACEL POWDER (N/A ) WOUND VAC CHANGE (N/A )     Patient location during evaluation: PACU Anesthesia Type: General Level of consciousness: awake and alert Pain management: pain level controlled Vital Signs Assessment: post-procedure vital signs reviewed and stable Respiratory status: spontaneous breathing, nonlabored ventilation, respiratory function stable and patient connected to nasal cannula oxygen Cardiovascular status: blood pressure returned to baseline and stable Postop Assessment: no apparent nausea or vomiting Anesthetic complications: no    Last Vitals:  Vitals:   03/31/19 0858 03/31/19 0859  BP:  (!) 144/75  Pulse: 76 76  Resp: 18 19  Temp:  36.7 C  SpO2: 98% 97%    Last Pain:  Vitals:   03/31/19 0859  TempSrc:   PainSc: 0-No pain                 Effie Berkshire

## 2019-03-31 NOTE — Op Note (Signed)
NAME: [name] [name], [name] [name] MEDICAL RECORD [id number] ACCOUNT [id number] DATE OF BIRTH:1961-04-16 FACILITY: [facility] LOCATION: [facility] PHYSICIAN:Adelin Ventrella VAN TRIGT III, MD  OPERATIVE REPORT  DATE OF PROCEDURE:  03/31/2019  OPERATION: 1.  Sternal wound irrigation and debridement. 2.  Application of ACell powder 1 gram. 3.  Wound vacuum assisted closure device change.  SURGEON:  Ivin Poot, MD  CO-SURGEONLyndee Leo Dillingham DO.  ANESTHESIA:  General.  PREOPERATIVE DIAGNOSES:   1.  Superficial sternal wound infection with dehiscence of upper sternal wires which cut through the bone.   2.  History of emergency sternotomy for repair of type A ascending aortic dissection and coronary artery bypass grafting  x1 following unsuccessful percutaneous coronary intervention of the proximal right coronary artery stenosis.  ANESTHESIA:  General.    DESCRIPTION OF PROCEDURE:  The patient was brought from preop holding where informed consent was documented and all final questions addressed with the patient.  The patient was placed supine on the operating table.  He received a dose of IV vancomycin.   General anesthesia was induced.  The patient remained stable.  The previously placed wound VAC sheaths and sponges were removed.  The patient was then prepped and draped as a sterile field.  A proper time-out was performed.  The wound was examined.  The wound was approximately 16 cm long, 2.5 cm wide and 3 cm deep.  There was no purulence.  There is some fibrinous exudate on the sternal bone edges superiorly where the sternum was separated slightly and this was cleaned with  curettes.  The wound was then irrigated with a liter of vancomycin irrigation.  Next, Dr. Marla Roe applied 1 gram of ACell powder to the depth of the wound.  She then placed 2 white ACell sponges between the sternal edges and a black sponge on top and covered with the sterile wound VAC sheets.  Over this, the [place] system was  used  to reduce the tension on the sternal incision, which was then covered with a breast binder.    The patient was reversed from anesthesia and returned to recovery room in stable condition.  AN/NUANCE  D:03/31/2019 T:03/31/2019 JOB:[id number]

## 2019-03-31 NOTE — Transfer of Care (Signed)
Immediate Anesthesia Transfer of Care Note  Patient: [name] [name]  Procedure(s) Performed: STERNAL WOUND DEBRIDEMENT USING 1G ACEL POWDER (N/A ) WOUND VAC CHANGE (N/A )  Patient Location: PACU  Anesthesia Type:General  Level of Consciousness: awake, alert  and patient cooperative  Airway & Oxygen Therapy: Patient Spontanous Breathing  Post-op Assessment: Report given to RN and Post -op Vital signs reviewed and stable  Post vital signs: Reviewed and stable  Last Vitals:  Vitals Value Taken Time  BP 145/82 03/31/2019  8:28 AM  Temp    Pulse 81 03/31/2019  8:30 AM  Resp 14 03/31/2019  8:30 AM  SpO2 97 % 03/31/2019  8:30 AM  Vitals shown include unvalidated device data.  Last Pain:  Vitals:   03/31/19 0400  TempSrc:   PainSc: Asleep      Patients Stated Pain Goal: 2 (65/99/35 7017)  Complications: No apparent anesthesia complications

## 2019-03-31 NOTE — Progress Notes (Signed)
Transferred to inpatient OR holding area w/out complications. Pt voiced concern of glasses & wedding band staying safe in room. Both were placed securely in glasses holder on nightstand & told pt of location. Pt calm & VSS when I left him.

## 2019-03-31 NOTE — Brief Op Note (Signed)
03/31/2019  8:40 AM  PATIENT:  [name]  58 y.o. male  PRE-OPERATIVE DIAGNOSIS:  STERNAL WOUND INFECTION  POST-OPERATIVE DIAGNOSIS:  STERNAL WOUND INFECTION  PROCEDURE:  Procedure(s): STERNAL WOUND DEBRIDEMENT USING 1G ACEL POWDER (N/A) WOUND VAC CHANGE (N/A)  SURGEON:  Surgeon(s) and Role:    Ivin Poot, MD - Primary    * Dillingham, Loel Lofty, DO - Assisting  PHYSICIAN ASSISTANT: none  ASSISTANTS: none   ANESTHESIA:   general  EBL:  10 mL   BLOOD ADMINISTERED:none  DRAINS: wound vac with 1 black sponge, 2 white deep sponges   LOCAL MEDICATIONS USED:  NONE  SPECIMEN:  No Specimen  DISPOSITION OF SPECIMEN:  N/A  COUNTS:  YES  TOURNIQUET:  * No tourniquets in log *  DICTATION: .Dragon Dictation  PLAN OF CARE: return to 2 H 16  PATIENT DISPOSITION:  PACU - hemodynamically stable.   Delay start of Pharmacological VTE agent (>24hrs) due to surgical blood loss or risk of bleeding: no lovenox

## 2019-03-31 NOTE — Progress Notes (Signed)
      [address]       [place] [id number]             [phone]      Up in chair, in good spirits  BP 139/78   Pulse 76   Temp 98.3 F (36.8 C) (Oral)   Resp 13   Ht 5' 8"  (1.727 m)   Wt 86.2 kg   SpO2 97%   BMI 28.89 kg/m   S/p debridement and VAC change  Doing well  Remo Lipps C. Roxan Hockey, MD [facility] [phone]

## 2019-03-31 NOTE — Anesthesia Preprocedure Evaluation (Addendum)
Anesthesia Evaluation  Patient identified by MRN, date of birth, ID band Patient awake    Reviewed: Allergy & Precautions, NPO status , Patient's Chart, lab work & pertinent test results  Airway Mallampati: II  TM Distance: >3 FB Neck ROM: Full    Dental  (+) Dental Advisory Given, Edentulous Upper, Missing,    Pulmonary asthma , COPD, former smoker,    breath sounds clear to auscultation       Cardiovascular hypertension, Pt. on home beta blockers and Pt. on medications + CAD, + Past MI, + Cardiac Stents, + CABG and + Peripheral Vascular Disease   Rhythm:Regular Rate:Normal     Neuro/Psych TIACVA    GI/Hepatic GERD  Medicated,(+) Hepatitis -  Endo/Other  negative endocrine ROS  Renal/GU negative Renal ROS     Musculoskeletal   Abdominal Normal abdominal exam  (+)   Peds  Hematology   Anesthesia Other Findings   Reproductive/Obstetrics                            Lab Results  Component Value Date   WBC 10.3 03/31/2019   HGB 9.6 (L) 03/31/2019   HCT 32.8 (L) 03/31/2019   MCV 89.6 03/31/2019   PLT 438 (H) 03/31/2019   Lab Results  Component Value Date   CREATININE 1.07 03/31/2019   BUN 22 (H) 03/31/2019   NA 134 (L) 03/31/2019   K 3.9 03/31/2019   CL 98 03/31/2019   CO2 26 03/31/2019   Lab Results  Component Value Date   INR 1.2 03/21/2019   INR 1.1 03/18/2019   INR 1.1 02/24/2019   Echo: 1. The left ventricle has low normal systolic function, with an ejection fraction of 50-55%. The cavity size was normal.  2. Left atrial size was moderately dilated.  3. Small pericardial effusion.  4. Small mostly apical pericardial effusion no signs of tamponade.  5. Mild thickening of the mitral valve leaflet. Mild calcification of the mitral valve leaflet.  6. Tricuspid valve regurgitation is mild-moderate.  7. The aortic valve is tricuspid Mild thickening of the aortic valve Mild  calcification of the aortic valve. Aortic valve regurgitation is trivial by color flow Doppler.  8. The aortic root is normal in size and structure.  9. The interatrial septum was not assessed.  Anesthesia Physical Anesthesia Plan  ASA: III  Anesthesia Plan: General   Post-op Pain Management:    Induction: Intravenous  PONV Risk Score and Plan: 3 and Ondansetron, Dexamethasone and Midazolam  Airway Management Planned: LMA  Additional Equipment: None  Intra-op Plan:   Post-operative Plan: Extubation in OR  Informed Consent: I have reviewed the patients History and Physical, chart, labs and discussed the procedure including the risks, benefits and alternatives for the proposed anesthesia with the patient or authorized representative who has indicated his/her understanding and acceptance.     Dental advisory given  Plan Discussed with: CRNA  Anesthesia Plan Comments:        Anesthesia Quick Evaluation

## 2019-03-31 NOTE — Progress Notes (Signed)
Pre Procedure note for inpatients:   [name] [name] has been scheduled for Procedure(s): STERNAL WOUND DEBRIDEMENT (N/A) WOUND VAC CHANGE (N/A) today. The various methods of treatment have been discussed with the patient. After consideration of the risks, benefits and treatment options the patient has consented to the planned procedure.   The patient has been seen and labs reviewed. There are no changes in the patient's condition to prevent proceeding with the planned procedure today.  Recent labs:  Lab Results  Component Value Date   WBC 10.3 03/31/2019   HGB 9.6 (L) 03/31/2019   HCT 32.8 (L) 03/31/2019   PLT 438 (H) 03/31/2019   GLUCOSE 101 (H) 03/31/2019   CHOL 156 02/23/2019   TRIG 101 02/23/2019   HDL 31 (L) 02/23/2019   LDLCALC 105 (H) 02/23/2019   ALT 20 03/31/2019   AST 21 03/31/2019   NA 134 (L) 03/31/2019   K 3.9 03/31/2019   CL 98 03/31/2019   CREATININE 1.07 03/31/2019   BUN 22 (H) 03/31/2019   CO2 26 03/31/2019   TSH 4.862 (H) 02/27/2019   INR 1.2 03/21/2019   HGBA1C 6.0 (H) 02/23/2019    Len Childs, MD 03/31/2019 7:26 AM

## 2019-03-31 NOTE — Anesthesia Procedure Notes (Signed)
Procedure Name: LMA Insertion Date/Time: 03/31/2019 7:39 AM Performed by: Elayne Snare, CRNA Pre-anesthesia Checklist: Patient identified, Emergency Drugs available, Suction available and Patient being monitored Patient Re-evaluated:Patient Re-evaluated prior to induction Oxygen Delivery Method: Circle System Utilized Preoxygenation: Pre-oxygenation with 100% oxygen Induction Type: IV induction LMA: LMA inserted LMA Size: 5.0 Number of attempts: 1 Placement Confirmation: positive ETCO2 Tube secured with: Tape Dental Injury: Teeth and Oropharynx as per pre-operative assessment

## 2019-04-01 ENCOUNTER — Encounter (HOSPITAL_COMMUNITY): Payer: Self-pay | Admitting: Cardiothoracic Surgery

## 2019-04-01 LAB — TYPE AND SCREEN
ABO/RH(D): O POS
Antibody Screen: NEGATIVE
Unit division: 0
Unit division: 0
Unit division: 0
Unit division: 0

## 2019-04-01 LAB — CBC
HCT: 31 % — ABNORMAL LOW (ref 39.0–52.0)
Hemoglobin: 9.2 g/dL — ABNORMAL LOW (ref 13.0–17.0)
MCH: 26.4 pg (ref 26.0–34.0)
MCHC: 29.7 g/dL — ABNORMAL LOW (ref 30.0–36.0)
MCV: 89.1 fL (ref 80.0–100.0)
Platelets: 433 10*3/uL — ABNORMAL HIGH (ref 150–400)
RBC: 3.48 MIL/uL — ABNORMAL LOW (ref 4.22–5.81)
RDW: 15.3 % (ref 11.5–15.5)
WBC: 12.2 10*3/uL — ABNORMAL HIGH (ref 4.0–10.5)
nRBC: 0 % (ref 0.0–0.2)

## 2019-04-01 LAB — BPAM RBC
Blood Product Expiration Date: 202004172359
Blood Product Expiration Date: 202004192359
Blood Product Expiration Date: 202004222359
Blood Product Expiration Date: 202004222359
ISSUE DATE / TIME: 202003310823
ISSUE DATE / TIME: 202003310823
Unit Type and Rh: 5100
Unit Type and Rh: 5100
Unit Type and Rh: 5100
Unit Type and Rh: 5100

## 2019-04-01 LAB — BASIC METABOLIC PANEL
Anion gap: 9 (ref 5–15)
BUN: 35 mg/dL — ABNORMAL HIGH (ref 6–20)
CO2: 29 mmol/L (ref 22–32)
Calcium: 9.3 mg/dL (ref 8.9–10.3)
Chloride: 96 mmol/L — ABNORMAL LOW (ref 98–111)
Creatinine, Ser: 1.11 mg/dL (ref 0.61–1.24)
GFR calc Af Amer: >60 mL/min (ref >60)
GFR calc non Af Amer: >60 mL/min (ref >60)
Glucose, Bld: 117 mg/dL — ABNORMAL HIGH (ref 70–99)
Potassium: 3.9 mmol/L (ref 3.5–5.1)
Sodium: 134 mmol/L — ABNORMAL LOW (ref 135–145)

## 2019-04-01 MED ORDER — CIPROFLOXACIN HCL 500 MG PO TABS
500.0000 mg | ORAL_TABLET | Freq: Two times a day (BID) | ORAL | Status: DC
  Administered 2019-04-01 – 2019-04-06 (×9): 500 mg via ORAL
  Filled 2019-04-01 (×11): qty 1

## 2019-04-01 MED ORDER — CHLORHEXIDINE GLUCONATE CLOTH 2 % EX PADS
6.0000 | MEDICATED_PAD | Freq: Every day | CUTANEOUS | Status: DC
  Administered 2019-04-01 – 2019-04-07 (×6): 6 via TOPICAL

## 2019-04-01 NOTE — Plan of Care (Signed)
  Problem: Education: Goal: Knowledge of General Education information will improve Description Including pain rating scale, medication(s)/side effects and non-pharmacologic comfort measures Outcome: Progressing   Problem: Health Behavior/Discharge Planning: Goal: Ability to manage health-related needs will improve Outcome: Progressing   Problem: Clinical Measurements: Goal: Ability to maintain clinical measurements within normal limits will improve Outcome: Progressing Goal: Will remain free from infection Outcome: Progressing Goal: Diagnostic test results will improve Outcome: Progressing Goal: Respiratory complications will improve Outcome: Progressing Goal: Cardiovascular complication will be avoided Outcome: Progressing   Problem: Activity: Goal: Risk for activity intolerance will decrease Outcome: Progressing   Problem: Nutrition: Goal: Adequate nutrition will be maintained Outcome: Progressing   Problem: Coping: Goal: Level of anxiety will decrease Outcome: Progressing   Problem: Pain Managment: Goal: General experience of comfort will improve Outcome: Progressing   Problem: Skin Integrity: Goal: Risk for impaired skin integrity will decrease Outcome: Progressing   Problem: Education: Goal: Understanding of cardiac disease, CV risk reduction, and recovery process will improve Outcome: Progressing   Problem: Activity: Goal: Ability to tolerate increased activity will improve Outcome: Progressing   Problem: Cardiac: Goal: Ability to achieve and maintain adequate cardiovascular perfusion will improve Outcome: Progressing   Problem: Health Behavior/Discharge Planning: Goal: Ability to safely manage health-related needs after discharge will improve Outcome: Progressing

## 2019-04-01 NOTE — Discharge Summary (Addendum)
Physician Discharge Summary       [address]       [place], [address]             [phone]      Patient ID: [name] [name] MRN: [id number] DOB/AGE: 07/23/1961 58 y.o.  Admit date: 03/18/2019 Discharge date: 04/07/2019  Admission Diagnoses: Principal Problem:   Abscess of sternal wound Active Problems  Essential hypertension   CAD (coronary artery disease)   Acute on chronic diastolic heart failure   History of CVA (cerebrovascular accident)   NASH (nonalcoholic steatohepatitis)   COPD (chronic obstructive pulmonary disease) (HCC)   Recent Type A aortic dissection (HCC)   S/P ascending aortic aneurysm repair 02/23/19   PAF (paroxysmal atrial fibrillation) ([place])  Discharge Diagnoses:  Principal Problem:   Abscess of sternal wound with dehiscence Active Problems:   Essential hypertension   CAD (coronary artery disease)   Acute on chronic diastolic heart failure   History of CVA (cerebrovascular accident)   NASH (nonalcoholic steatohepatitis)   COPD (chronic obstructive pulmonary disease) (HCC)   Recent Type A aortic dissection (HCC)   S/P ascending aortic aneurysm repair 02/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 is a 58 year old male with history of COPD, hypertension, and acute on chronic diastolic heart failure.  He was admitted to [facility] in late February with unstable angina and underwent left heart catheterization.  Study demonstrated a 90% stenosis of the proximal right coronary artery.  Percutaneous intervention was carried out to this lesion but unfortunately was complicated by a type a aortic dissection.  He underwent emergent repair of the dissection and single-vessel coronary bypass grafting wit a saphenous vein graft to the right coronary artery that same day by Dr. Prescott Gum.  His postoperative course was significant for paroxysmal atrial  fibrillation that was managed with amiodarone and converted back to stable sinus rhythm.  He otherwise made an uneventful recovery and was discharged to home on 03/05/2019.  He presented to the emergency room at [facility] on 03/18/2019 with a 3-day history of progressively worsening central chest pain with shortness of breath.  On arrival to the ER, he had a fever of 101.3.  His white blood count was 11,900 and his hemoglobin was stable at 8.1.  His chest incision was intact and healing with no sign of complication.  Chest x-ray was unremarkable CT angiogram of the aorta showed fluid collections below the sternotomy and right ward of the ascending aortic graft suggesting abscess.  There was a moderate pericardial effusion.  Blood cultures were obtained and were ultimately reported as negative.    Hospital Course: [name] [name] was admitted to the hospital by the internal medicine service and our service was consulted.  An echocardiogram was obtained that showed a small pericardial effusion did not show any tamponade physiology and thus required no urgent intervention.  Presumptive diagnosis of pericarditis was considered and appropriate therapy initiated.  Patient was kept in the hospital for observation.  He had no further fever and his white blood count normalized.  However, on 03/21/2019, the patient developed purulent drainage from the upper sternal wound.  Cultures were obtained and ultimately showed no growth.  Patient was taken to the operating room on 03/22/2023 sternal wound debridement and placement of a wound VAC.  He was maintained on broad-spectrum IV antibiotics  He had minimal drainage from the wound VAC but  continued to have significant parasternal pain.  The cardiology service was consulted by the primary team for assistance with managing the patient's antiplatelet therapy and view of his recently placed RCA stent and for assistance in managing his heart failure.  He was diuresed  aggressively and responded well.  [name] [name] was returned to the operating room on  03/25/2019 for sternal wound debridement and wound VAC change.  Again on 03/29/2019, he was returned to the operating room for wound VAC change.  At this episode, he was noted to have pulled the 3 uppermost sternal wires through the sternum.  The 3 wires were removed and fibrinous material was debrided from the sternal edges.  Following this procedure, the patient was transferred to the ICU for close monitoring given his sternal instability.  Broad-spectrum antibiotics were continued.  Wound cultures remained negative.  [name] [name] was again returned to the operating room on 03/31/2019 for wound VAC change.  Dr. Darcey Nora was accompanied by Dr. Marla Roe of the plastic surgery service.  The wound was irrigated and debrided once again and 1 g of ACell powder was applied prior to replacement of the wound VAC.  He was returned to the OR on 04/04/19 for further debridement of the wound and sternum buy Dr. Marla Roe and Dr. Darcey Nora. At that time, a right pectoralis major turnover flap was utilized to reconstruct the open wound with an excellent result. A Prevena negative pressure wound dressing was utilized post operatively. Wound cultures remained negative. IV antibiotcs were discontinued and he was started on oral doxycycline 167m po BID for 7 days.  Arrangements were made for home health nursing to assist with monitoring the Prevena wound dressing equipment and JP drains.   Consults: Dr. DMarla Roe       Dr. BHaroldine Laws Significant Diagnostic Studies:  EXAM: CT ANGIOGRAPHY CHEST WITH CONTRAST  TECHNIQUE: Multidetector CT imaging of the chest was performed using the standard protocol during bolus administration of intravenous contrast. Multiplanar CT image reconstructions and MIPs were obtained to evaluate the vascular anatomy.  CONTRAST:  741mOMNIPAQUE IOHEXOL 350 MG/ML SOLN  COMPARISON:  Chest CTA 02/23/2019.  There is some kind of archive error, only coronal reformats are available from that study.  FINDINGS: Cardiovascular: Recent ascending aortic repair with patent graft. No cardiomegaly. There is a moderate pericardial effusion that is lobulated in places, up to 14 mm in thickness. Substernal low-density with perceptible enhancing wall you have been in the arterial phase. On axial slices the collection measures up to 4 cm in maximal diameter. The mediastinal fat and presternal fat is stranded. Wound is not dehiscent and there is no clear involucrum or other osteomyelitis changes, although recent surgery. There does appear to be low-density rim enhancing collection rightward from the graft measuring up to 4 cm. Mural irregularity along the anterior and right aspect of the proximal graft is favored to reflect postoperative change. Single-vessel CABG to the right coronary circulation that is patent proximally. Pulmonary arteries are patent. Dissection along the arch and descending aorta with termination before the aortic hiatus. There is mild opacification of the false lumen that is progressed. When compared on coronal images the dissection has mildly propagated in the descending segment.  Mediastinum/Nodes: Adenopathy considered reactive.  Lungs/Pleura: There is no edema, consolidation, effusion, or pneumothorax.  Upper Abdomen: No acute finding  Musculoskeletal: Sternal findings described above.  These results were were discussed via telephone at the time of interpretation on 03/18/2019 at 5:56 am with Dr. JAJennette Kettle  Review of the MIP images confirms the above findings.  IMPRESSION: 1. Rim enhancing fluid collections below the sternotomy and rightward of the ascending aortic graft, history suggesting abscess. Moderate pericardial effusion. 2. Patent ascending aortic graft and right coronary graft. Mural irregularity along the proximal aortic graft is  likely postoperative. If location is discordant with operative findings a follow-up gated study may be used to exclude an early paravalvular leak. 3. Known descending aortic dissection with increased enhancement within the false lumen. There has been mild propagation of the dissection towards the aortic hiatus.   Electronically Signed   By: Monte Fantasia M.D.   On: 03/18/2019 06:01  ECHOCARDIOGRAM LIMITED REPORT       Patient Name:   [name] [name] Date of Exam: 03/21/2019 Medical Rec #:  [id number]          Height:       68.0 in Accession #:    [id number]         Weight:       242.5 lb Date of Birth:  01/10/1961         BSA:          2.22 m Patient Age:    76 years           BP:           157/82 mmHg Patient Gender: M                  HR:           86 bpm. Exam Location:  Inpatient    Procedure: Limited Echo and Limited Color Doppler  Indications:    Evaluate postoperative Pericardal effusion   History:        Patient has prior history of Echocardiogram examinations, most                 recent 03/18/2019. CAD and Previous Myocardial Infarction, Prior                 CABG, COPD; Risk Factors: Hypertension and Dyslipidemia.   Sonographer:    Mikki Santee RDCS (AE) Referring Phys: [id number] [place]    1. Moderate pericardial effusion primarily located peri-apically. There is > 25% respirophasic variation in the mitral E inflow velocity. Septal bounce is not marked. The IVC is dilated to 2.7 cm but with normal respirophasic variation. 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 If patient is symptomatic, would consider hemodynamic cath.  2. Mild calcification of the aortic valve. The aortic valve was not assessed by doppler.  3. The interatrial septum was not assessed.  4. No evidence of mitral valve stenosis. No mitral regurgitation.  5. Normal RV size with mildly decreased  systolic function.  6. The left ventricle had a visually estimated ejection fraction of of 55%. The cavity size was normal. indeterminate diastolic function. Images inadequate for wall motion analysis.  FINDINGS  Left Ventricle: The left ventricle has a visually estimated ejection fraction of of 55%. The cavity size was normal. There is no increase in left ventricular wall thickness. Left ventricular diastolic Doppler parameters are consistent with indeterminate  diastolic dysfunction. Right Ventricle: The right ventricle has mildly reduced systolic function. The cavity was normal. There is no increase in right ventricular wall thickness. Left Atrium: Left atrial size was normal in size. Right Atrium: Right atrial size was normal in size. Right atrial pressure is estimated at 8  mmHg. Interatrial Septum: The interatrial septum was not assessed. Pericardium: Moderate pericardial effusion primarily located peri-apically. Mitral Valve: The mitral valve is normal in structure. Mitral valve regurgitation is not visualized by color flow Doppler. No evidence of mitral valve stenosis. Aortic Valve: Mild calcification of the aortic valve. Not assessed by doppler. Venous: The inferior vena cava is dilated in size with greater than 50% respiratory variability.   LEFT VENTRICLE PLAX 2D LV EF:         55 %  RIGHT ATRIUM RA Pressure: 8 mmHg    Loralie Champagne MD Electronically signed by Loralie Champagne MD Signature Date/Time: 03/21/2019/9:30:58 AM     ECHOCARDIOGRAM LIMITED REPORT       Patient Name:   [name] [name] Date of Exam: 03/28/2019 Medical Rec #:  [id number]          Height:       68.0 in Accession #:    [id number]         Weight:       218.7 lb Date of Birth:  1961-05-25         BSA:          2.12 m Patient Age:    65 years           BP:           159/76 mmHg Patient Gender: M                  HR:           80 bpm. Exam Location:  Inpatient    Procedure: 2D Echo, Cardiac  Doppler and Color Doppler  Indications:    Pericardial Effusion, 423.9 / I31.3   History:        Patient has prior history of Echocardiogram examinations, most                 recent 03/21/2019. Ascending Aortic Dissection, Aortic Repair,                 Atrial Fibrillation, Coronary Artery Disease, CVA, Chest Pain,                 Sepsis, Pneumonia, CABG.   Sonographer:    Madelaine Etienne RDCS (AE) Referring Phys: [place]    1. The left ventricle has low normal systolic function, with an ejection fraction of 50-55%. The cavity size was normal.  2. Left atrial size was moderately dilated.  3. Small pericardial effusion.  4. Small mostly apical pericardial effusion no signs of tamponade.  5. Mild thickening of the mitral valve leaflet. Mild calcification of the mitral valve leaflet.  6. Tricuspid valve regurgitation is mild-moderate.  7. The aortic valve is tricuspid Mild thickening of the aortic valve Mild calcification of the aortic valve. Aortic valve regurgitation is trivial by color flow Doppler.  8. The aortic root is normal in size and structure.  9. The interatrial septum was not assessed.  FINDINGS  Left Ventricle: The left ventricle has low normal systolic function, with an ejection fraction of 50-55%. The cavity size was normal. There is no increase in left ventricular wall thickness.   Left Atrium: Left atrial size was moderately dilated. Left Atrial Appendage: Right Atrium: Right atrial size was normal in size. Right atrial pressure is estimated at 10 mmHg. Interatrial Septum: The interatrial septum was not assessed. Pericardium: A small pericardial effusion is present. Small mostly apical pericardial effusion no signs of tamponade. Mitral  Valve: The mitral valve is normal in structure. Mild thickening of the mitral valve leaflet. Mild calcification of the mitral valve leaflet. Mitral valve regurgitation is not visualized by color flow  Doppler. Tricuspid Valve: The tricuspid valve was normal in structure. Tricuspid valve regurgitation is mild-moderate by color flow Doppler. Aortic Valve: The aortic valve is tricuspid Mild thickening of the aortic valve. Mild calcification of the aortic valve. Aortic valve regurgitation is trivial by color flow Doppler. Pulmonic Valve: The pulmonic valve was grossly normal. Pulmonic valve regurgitation is mild by color flow Doppler. Aorta: The aortic root is normal in size and structure.   LEFT VENTRICLE PLAX 2D LVIDd:         4.60 cm LVIDs:         2.30 cm LV PW:         0.70 cm LV IVS:        1.00 cm LV SV:         79 ml LV SV Index:   35.75   LV Volumes (MOD) LV area d, A2C:    38.80 cm LV area d, A4C:    35.10 cm LV area s, A2C:    23.60 cm LV area s, A4C:    24.30 cm LV major d, A2C:   8.54 cm LV major d, A4C:   8.97 cm LV major s, A2C:   6.26 cm LV major s, A4C:   7.56 cm LV vol d, MOD A2C: 146.0 ml LV vol d, MOD A4C: 114.0 ml LV vol s, MOD A2C: 75.9 ml LV vol s, MOD A4C: 67.0 ml LV SV MOD A2C:     70.1 ml LV SV MOD A4C:     114.0 ml LV SV MOD BP:      53.2 ml  RIGHT VENTRICLE RVSP:           28.8 mmHg  LEFT ATRIUM         Index      RIGHT ATRIUM LA diam:    4.70 cm 2.21 cm/m RA Pressure: 10 mmHg                        PULMONIC VALVE AORTA                 PV Vmax:       1.48 m/s Ao Root diam: 3.50 cm PV Peak grad:  8.8 mmHg Ao Asc diam:  2.70 cm    TRICUSPID VALVE TR Peak grad:   18.8 mmHg TR Vmax:        217.00 cm/s RVSP:           28.8 mmHg    Jenkins Rouge MD Electronically signed by Jenkins Rouge MD Signature Date/Time: 03/28/2019/11:37:45 AM  Treatments: Surgery  OPERATIVE REPORT  DATE OF PROCEDURE:  03/22/2019  OPERATION:  Incision and drainage of superficial sternal wound with application of wound vacuum-assisted closure.  SURGEON:  Tharon Aquas Trigt III, MD  PREOPERATIVE DIAGNOSIS:  Superficial sternal wound infection after  emergency aortic dissection repair and coronary artery bypass graft x1.  POSTOPERATIVE DIAGNOSIS:  Superficial sternal wound infection after emergency aortic dissection repair and coronary artery bypass graft x1.   OPERATIVE REPORT  DATE OF PROCEDURE:  03/25/2019  OPERATION:  Sternal wound VAC change with irrigation of wound.  SURGEON:  Ivin Poot III, MD  ANESTHESIA:  General.  PREOPERATIVE DIAGNOSES: 1.  Superficial sternal wound infection after emergency sternotomy for repair of aortic  dissection and coronary artery bypass graft x1 to the right coronary artery. 2.  Obesity. 3.  Poorly controlled diabetes.  POSTOPERATIVE DIAGNOSES: 1.  Superficial sternal wound infection after emergency sternotomy for repair of aortic dissection and coronary artery bypass graft x1 to the right coronary artery. 2.  Obesity. 3.  Poorly controlled diabetes.    OPERATIVE REPORT  DATE OF PROCEDURE:  03/29/2019  OPERATION:  Sternal wound irrigation and excisional debridement with removal of upper sternal wires.  Wound VAC change.  SURGEON:  Ivin Poot, MD  ASSISTANT:  Enid Cutter, PA-C.  ANESTHESIA:  General.  PREOPERATIVE DIAGNOSIS:  Superficial sternal wound infection after emergency sternotomy for repair of type A aortic dissection and a combined CABG x1 on 02/23/2019.  POSTOPERATIVE DIAGNOSIS:  Superficial sternal wound infection after emergency sternotomy for repair of type A aortic dissection and a combined CABG x1 on 02/23/2019.   OPERATIVE REPORT  DATE OF PROCEDURE:  03/31/2019  OPERATION: 1.  Sternal wound irrigation and debridement. 2.  Application of ACell powder 1 gram. 3.  Wound vacuum assisted closure device change.  SURGEON:  Ivin Poot, MD  CO-SURGEONLyndee Leo Dillingham DO.  ANESTHESIA:  General.  PREOPERATIVE DIAGNOSES:   1.  Superficial sternal wound infection with dehiscence of upper sternal wires which cut through  the bone.   2.  History of emergency sternotomy for repair of type A ascending aortic dissection and coronary artery bypass grafting  x1 following unsuccessful percutaneous coronary intervention of the proximal right coronary artery stenosis.  DATE OF OPERATION: 04/04/2019  LOCATION: [facility]  PREOPERATIVE DIAGNOSIS: Sternal / Chest wound with infection   POSTOPERATIVE DIAGNOSIS: Same  PROCEDURE: 1. Pectoralis Major Muscle Turnover Flap. 2. VAC and ABRA placement. 3. Debridement / Excision of 1 x 4 cm soft tissue and bone.  SURGEON: Claire Sanger Dillingham, DO  COSURGEON:  [name], MD  ASSISTANT: [place], RNFA  EBL: less than 150 cc  SPECIMEN: None  DRAINS:  total 19 blake round drains  CONDITION: Stable  COMPLICATIONS: None   Discharge Exam: Blood pressure 140/86, pulse 87, temperature 98.5 F (36.9 C), temperature source Oral, resp. rate 13, height 5' 8"  (1.727 m), weight 96.4 kg, SpO2 98 %.  General appearance:alert, cooperative and no distress Neurologic:intact Heart:regular rate and rhythm Lungs:Breath sounds are clear. Wound:negative pressure dressing in place over sternal wound. Minimal serosanguinous drainagefrom vac.. JP'sdrained 136m past 24 hours.  Disposition:Discharged to home with home health care.   Allergies as of 04/07/2019      Reactions   Penicillins Hives, Rash, Other (See Comments)   Did it involve swelling of the face/tongue/throat, SOB, or low BP? Yes Did it involve sudden or severe rash/hives, skin peeling, or any reaction on the inside of your mouth or nose? Yes  Did you need to seek medical attention at a hospital or doctor's office? Yes When did it last happen?As a child. If all above answers are "NO", may proceed with cephalosporin use.      Medication List    STOP taking these medications   acetaminophen 325 MG tablet Commonly known as:  TYLENOL   amiodarone 200 MG  tablet Commonly known as:  PACERONE   amLODipine 10 MG tablet Commonly known as:  NORVASC   aspirin 325 MG tablet Replaced by:  aspirin 81 MG EC tablet     TAKE these medications   albuterol 108 (90 Base) MCG/ACT inhaler Commonly known as:  PROVENTIL HFA;VENTOLIN HFA Inhale 1  puff into the lungs every 6 (six) hours as needed for wheezing or shortness of breath.   allopurinol 100 MG tablet Commonly known as:  ZYLOPRIM Take 100 mg by mouth daily.   aspirin 81 MG EC tablet Take 1 tablet (81 mg total) by mouth daily. Replaces:  aspirin 325 MG tablet   atorvastatin 80 MG tablet Commonly known as:  LIPITOR Take 1 tablet (80 mg total) by mouth daily at 6 PM.   carvedilol 12.5 MG tablet Commonly known as:  COREG Take 1 tablet (12.5 mg total) by mouth 2 (two) times daily with a meal. What changed:    medication strength  how much to take   doxycycline 100 MG tablet Commonly known as:  VIBRA-TABS Take 1 tablet (100 mg total) by mouth every 12 (twelve) hours.   esomeprazole 40 MG capsule Commonly known as:  NEXIUM Take 40 mg by mouth 2 (two) times daily.   ferrous sulfate 325 (65 FE) MG EC tablet Take 1 tablet (325 mg total) by mouth 2 (two) times daily.   Fish Oil 1200 MG Cpdr Take 1,200 mg by mouth daily.   furosemide 40 MG tablet Commonly known as:  Lasix Take 1 tablet (40 mg total) by mouth daily.   ipratropium-albuterol 0.5-2.5 (3) MG/3ML Soln Commonly known as:  DUONEB Take 3 mLs by nebulization every 8 (eight) hours as needed (wheezing, shortness of breath).   losartan 100 MG tablet Commonly known as:  COZAAR Take 1 tablet (100 mg total) by mouth daily.   multivitamin with minerals Tabs tablet Take 1 tablet by mouth daily.   nitroGLYCERIN 0.4 MG SL tablet Commonly known as:  NITROSTAT Place 0.4 mg under the tongue every 5 (five) minutes as needed. For chest pain   oxyCODONE-acetaminophen 5-325 MG tablet Commonly known as:  PERCOCET/ROXICET Take 1-2  tablets by mouth every 4 (four) hours as needed for up to 7 days for moderate pain or severe pain.   spironolactone 25 MG tablet Commonly known as:  ALDACTONE Take 0.5 tablets (12.5 mg total) by mouth daily.   traMADol 50 MG tablet Commonly known as:  ULTRAM Take 1-2 tablets (50-100 mg total) by mouth every 6 (six) hours as needed (mild pain).   traZODone 100 MG tablet Commonly known as:  DESYREL Take 100 mg by mouth at bedtime.      Follow-up Information    [facility] [place].   Specialty:  [place] Why:  registered nurse and nurse aide       Wallace Going, DO Follow up in 1 week(s).   Specialty:  Plastic Surgery Why:  Please call Dr. Eusebio Friendly office for an appointment if you have not heard from them by Monday 04/11/19.  Contact information: [address] [address] [phone]        [facility] Follow up.   Why:  or [facility] @ [phone] for negative pressure wound therapy       Prescott Gum, Collier [place], MD Follow up in 2 week(s).   Specialty:  Cardiothoracic Surgery Why:  We will plan to contact you by phone prior to your appointment to evaluate your progress and, if appropriate, we may postpone your office visit in order to minimize risk of COVID-19 infection.  Contact information: [address] [place] [address] [phone]           Signed: Antony Odea, PA-C 04/07/2019, 10:19 AM   patient examined and medical  record reviewed,agree with above note. Tharon Aquas Trigt III 04/07/2019

## 2019-04-01 NOTE — Progress Notes (Addendum)
1 Day Post-Op Procedure(s) (LRB): STERNAL WOUND DEBRIDEMENT USING 1G ACEL POWDER (N/A) WOUND VAC CHANGE (N/A) Subjective: Up in chair eating breakfast. Says he is comfortable. No new problems.  Objective: Vital signs in last 24 hours: Temp:  [97.5 F (36.4 C)-98.7 F (37.1 C)] 98.7 F (37.1 C) (04/03 0739) Pulse Rate:  [57-89] 67 (04/03 0700) Cardiac Rhythm: Sinus bradycardia (04/03 0400) Resp:  [11-19] 13 (04/02 1400) BP: (100-154)/(51-86) 134/68 (04/03 0700) SpO2:  [92 %-99 %] 92 % (04/03 0700) Weight:  [95.2 kg] 95.2 kg (04/03 0600)    Intake/Output from previous day: 04/02 0701 - 04/03 0700 In: 2201 [P.O.:800; I.V.:514.5; IV Piggyback:836.6] Out: 1985 [Urine:1900; Drains:75; Blood:10] Intake/Output this shift: No intake/output data recorded.  General appearance: alert, cooperative and no distress Neurologic: intact Heart: regular rate and rhythm, no further bradycardia. Lungs: Breath sounds clear.  Wound: Wound vac properly compressed, retention dressing in place.  Had minimal serosanguinous drainage since return from OR yesterday.   Lab Results: Recent Labs    03/31/19 0255 04/01/19 0453  WBC 10.3 12.2*  HGB 9.6* 9.2*  HCT 32.8* 31.0*  PLT 438* 433*   BMET:  Recent Labs    03/31/19 0255 04/01/19 0453  NA 134* 134*  K 3.9 3.9  CL 98 96*  CO2 26 29  GLUCOSE 101* 117*  BUN 22* 35*  CREATININE 1.07 1.11  CALCIUM 9.4 9.3    PT/INR: No results for input(s): LABPROT, INR in the last 72 hours. ABG    Component Value Date/Time   PHART 7.451 (H) 03/18/2019 0329   HCO3 24.1 03/18/2019 0329   TCO2 25 03/18/2019 0329   ACIDBASEDEF 3.0 (H) 02/24/2019 0155   O2SAT 91.0 03/18/2019 0329   CBG (last 3)  No results for input(s): GLUCAP in the last 72 hours.  Assessment/Plan: S/P Procedure(s) (LRB): STERNAL WOUND DEBRIDEMENT USING 1G ACEL POWDER (N/A) WOUND VAC CHANGE (N/A)  -POD10 initial sternal wound dibridement for superficial wound infection with  subsequent return to the OR x 3 for further dibridement and vac change. Dr. Prescott Gum and Dr. Marla Roe entatively plan to return to OR Monday 04/04/19  for wound closure with muscle flap.   Wound Cx's remain negative. 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  -History of Afib- maintaining SR on amiodarone 211m PO daily.   -Chronic diastolic heart failure- appreciate assistance from cardiology. Appears euvolemic now on torsemide and spirinolactone.   -DVT PPX-  Continue SCD's  -Expected acute blood loss anemia- Hct stable, minimal output from wound vac overnight.  Continue iron supplement and monitor.    LOS: 14 days    MAntony Odea [place]erm[place]737 021 1514 04/01/2019   Patient's sternal wound is stable. Operative cultures remain negative. Minimal serosanguineous drainage from wound VAC system ABra elastic bands tightened to support sternal wound today  Continue current care with plans for sternal closure with muscle flap Monday a.m.  patient examined and medical record reviewed,agree with above note. PTharon AquasTrigt III 04/01/2019

## 2019-04-01 NOTE — TOC Progression Note (Signed)
Transition of Care [facility]) - Progression Note    Patient Details  Name: [name] [name] MRN: [id number] Date of Birth: 03-23-1961  Transition of Care [facility]) CM/SW [place] RN, BSN, NCM-BC, [place] [phone] Phone Number: 04/01/2019, 12:04 PM  Clinical Narrative: CM continue to follow for dispositional needs. Patient is tentatively scheduled for a sternal closure with muscle flap on 04/04/19. HH has been arranged with [facility]; NPWT with [facility]; [place] for home IV ABT. CM team will continue to follow.     Expected Discharge Plan: [place] Barriers to Discharge: Continued Medical Work up  Expected Discharge Plan and Services Expected Discharge Plan: [place]   Discharge Planning Services: CM Consult                     DME Arranged: Vac DME Agency: [facility] HH Arranged: RN, Nurse's Aide [place] Agency: [place] ([facility])   Social Determinants of Health (SDOH) Interventions    Readmission Risk Interventions No flowsheet data found.

## 2019-04-01 NOTE — Progress Notes (Signed)
CT Surgery PM Rounds  nsr Incisional discomfort controlled Min VAC wound drainage OR cultures remain negative Cont current care

## 2019-04-02 LAB — CBC
HCT: 34 % — ABNORMAL LOW (ref 39.0–52.0)
Hemoglobin: 10 g/dL — ABNORMAL LOW (ref 13.0–17.0)
MCH: 26.7 pg (ref 26.0–34.0)
MCHC: 29.4 g/dL — ABNORMAL LOW (ref 30.0–36.0)
MCV: 90.7 fL (ref 80.0–100.0)
Platelets: 470 10*3/uL — ABNORMAL HIGH (ref 150–400)
RBC: 3.75 MIL/uL — ABNORMAL LOW (ref 4.22–5.81)
RDW: 15.5 % (ref 11.5–15.5)
WBC: 11.8 10*3/uL — ABNORMAL HIGH (ref 4.0–10.5)
nRBC: 0 % (ref 0.0–0.2)

## 2019-04-02 LAB — AEROBIC/ANAEROBIC CULTURE W GRAM STAIN (SURGICAL/DEEP WOUND): Culture: NO GROWTH

## 2019-04-02 LAB — BASIC METABOLIC PANEL
Anion gap: 10 (ref 5–15)
BUN: 30 mg/dL — ABNORMAL HIGH (ref 6–20)
CO2: 27 mmol/L (ref 22–32)
Calcium: 9.6 mg/dL (ref 8.9–10.3)
Chloride: 98 mmol/L (ref 98–111)
Creatinine, Ser: 0.99 mg/dL (ref 0.61–1.24)
GFR calc Af Amer: >60 mL/min (ref >60)
GFR calc non Af Amer: >60 mL/min (ref >60)
Glucose, Bld: 93 mg/dL (ref 70–99)
Potassium: 4.4 mmol/L (ref 3.5–5.1)
Sodium: 135 mmol/L (ref 135–145)

## 2019-04-02 MED ORDER — HYDROMORPHONE HCL 1 MG/ML IJ SOLN
1.0000 mg | INTRAMUSCULAR | Status: DC | PRN
  Administered 2019-04-02 – 2019-04-06 (×22): 1 mg via INTRAVENOUS
  Filled 2019-04-02 (×22): qty 1

## 2019-04-02 NOTE — Progress Notes (Signed)
CT C surgery p.m. Rounds  Patient sitting up in chair, chest wall pain somewhat improved Wound VAC suction reduced to 75 mmHg Hemodynamics stable Afebrile

## 2019-04-02 NOTE — Progress Notes (Signed)
Occupational Therapy Treatment Patient Details Name: [name] [name] MRN: [id number] DOB: Jan 03, 1961 Today's Date: 04/02/2019    History of present illness [name] [name] is a 58 y.o. male with medical history significant of COPD, PAF on amiodarone, not on oral anticoagulation, CAD, admitted for unstable angina at end of Feb, underwent LHC with PCI to 90% RCA lesion, unfortunately this ended up being complicated by type A aortic dissection. Went home, then presented with 3 day history of SOB. Pt found to have sepsis due to sternal abscess. Pt underwent sternal debridement and VAC placement on 03/22/19, 03/25/19, 03/29/19.    OT comments  Pt demonstrates improving ROM of Rt hand - not can oppose digit V, and grip is now 4-/5.  Will continue to follow   Follow Up Recommendations  Outpatient OT;Supervision - Intermittent    Equipment Recommendations  None recommended by OT    Recommendations for Other Services      Precautions / Restrictions Precautions Precautions: Sternal Precaution Comments: wound vac Restrictions Weight Bearing Restrictions: Yes Other Position/Activity Restrictions: sternal precautions       Mobility Bed Mobility                  Transfers                      Balance                                           ADL either performed or assessed with clinical judgement   ADL                                               Vision       Perception     Praxis      Cognition Arousal/Alertness: Awake/alert Behavior During Therapy: WFL for tasks assessed/performed Overall Cognitive Status: Within Functional Limits for tasks assessed                                          Exercises Other Exercises Other Exercises: pt reports he has been performing HEP.  He is now able to almost fully oppose digit V.   Worked on AROM with overstretch of PIPs  Other Exercises: Pt deferred Lt  shoulder activity citing pain    Shoulder Instructions       General Comments      Pertinent Vitals/ Pain       Pain Assessment: 0-10 Pain Score: 7  Pain Location: sternal Pain Descriptors / Indicators: Operative site guarding Pain Intervention(s): Limited activity within patient's tolerance  Home Living                                          Prior Functioning/Environment              Frequency  Min 3X/week        Progress Toward Goals  OT Goals(current goals can now be found in the care plan section)  Progress towards OT goals: Progressing toward goals  Plan Discharge plan remains appropriate    Co-evaluation                 AM-PAC OT "6 Clicks" Daily Activity     Outcome Measure   Help from another person eating meals?: None Help from another person taking care of personal grooming?: A Little Help from another person toileting, which includes using toliet, bedpan, or urinal?: A Little Help from another person bathing (including washing, rinsing, drying)?: A Little Help from another person to put on and taking off regular upper body clothing?: A Little Help from another person to put on and taking off regular lower body clothing?: A Little 6 Click Score: 19    End of Session    OT Visit Diagnosis: Muscle weakness (generalized) (M62.81);Pain;Unsteadiness on feet (R26.81) Pain - Right/Left: Left Pain - part of body: Shoulder   Activity Tolerance Patient limited by pain   Patient Left in chair;with call bell/phone within reach   Nurse Communication          Time: 6435-3912 OT Time Calculation (min): 8 min  Charges: OT General Charges $OT Visit: 1 Visit OT Treatments $Therapeutic Exercise: 8-22 mins  Lucille Passy, OTR/L Acute Rehabilitation Services Pager [phone] Office [phone]    Lucille Passy M 04/02/2019, 5:48 PM

## 2019-04-02 NOTE — Progress Notes (Signed)
2 Days Post-Op Procedure(s) (LRB): STERNAL WOUND DEBRIDEMENT USING 1G ACEL POWDER (N/A) WOUND VAC CHANGE (N/A) Subjective: Complains of chest wall incisional soreness, pain medication adjusted Normal sinus rhythm, afebrile Minimal serosanguineous wound VAC drainage  Objective: Vital signs in last 24 hours: Temp:  [97.4 F (36.3 C)-99.7 F (37.6 C)] 99.3 F (37.4 C) (04/04 0722) Pulse Rate:  [55-87] 83 (04/04 0800) Cardiac Rhythm: Normal sinus rhythm (04/04 0800) Resp:  [20] 20 (04/04 0722) BP: (103-154)/(57-74) 132/69 (04/04 0800) SpO2:  [93 %-100 %] 97 % (04/04 0800) Weight:  [96.1 kg] 96.1 kg (04/04 0500)  Hemodynamic parameters for last 24 hours:  Stable  Intake/Output from previous day: 04/03 0701 - 04/04 0700 In: 2203.7 [P.O.:1660; I.V.:43.6; IV Piggyback:500.1] Out: 875 [Urine:875] Intake/Output this shift: Total I/O In: 270.4 [P.O.:240; I.V.:0.2; IV Piggyback:30.2] Out: 0   Sitting up in chair with some anxiety over pain Lungs clear Wound VAC compressed Abdomen soft Remedies warm with mild edema  Lab Results: Recent Labs    04/01/19 0453 04/02/19 0635  WBC 12.2* 11.8*  HGB 9.2* 10.0*  HCT 31.0* 34.0*  PLT 433* 470*   BMET:  Recent Labs    04/01/19 0453 04/02/19 0635  NA 134* 135  K 3.9 4.4  CL 96* 98  CO2 29 27  GLUCOSE 117* 93  BUN 35* 30*  CREATININE 1.11 0.99  CALCIUM 9.3 9.6    PT/INR: No results for input(s): LABPROT, INR in the last 72 hours. ABG    Component Value Date/Time   PHART 7.451 (H) 03/18/2019 0329   HCO3 24.1 03/18/2019 0329   TCO2 25 03/18/2019 0329   ACIDBASEDEF 3.0 (H) 02/24/2019 0155   O2SAT 91.0 03/18/2019 0329   CBG (last 3)  No results for input(s): GLUCAP in the last 72 hours.  Assessment/Plan: S/P Procedure(s) (LRB): STERNAL WOUND DEBRIDEMENT USING 1G ACEL POWDER (N/A) WOUND VAC CHANGE (N/A) Continue IV antibiotics and wound VAC drainage of sternal wound Plan muscle flap coverage Monday a.m.   LOS: 15  days    Tharon Aquas Trigt III 04/02/2019

## 2019-04-03 LAB — CBC
HCT: 31.6 % — ABNORMAL LOW (ref 39.0–52.0)
Hemoglobin: 9.4 g/dL — ABNORMAL LOW (ref 13.0–17.0)
MCH: 26.9 pg (ref 26.0–34.0)
MCHC: 29.7 g/dL — ABNORMAL LOW (ref 30.0–36.0)
MCV: 90.5 fL (ref 80.0–100.0)
Platelets: 403 10*3/uL — ABNORMAL HIGH (ref 150–400)
RBC: 3.49 MIL/uL — ABNORMAL LOW (ref 4.22–5.81)
RDW: 15.6 % — ABNORMAL HIGH (ref 11.5–15.5)
WBC: 11.1 10*3/uL — ABNORMAL HIGH (ref 4.0–10.5)
nRBC: 0 % (ref 0.0–0.2)

## 2019-04-03 LAB — BASIC METABOLIC PANEL
Anion gap: 7 (ref 5–15)
BUN: 23 mg/dL — ABNORMAL HIGH (ref 6–20)
CO2: 27 mmol/L (ref 22–32)
Calcium: 9.4 mg/dL (ref 8.9–10.3)
Chloride: 101 mmol/L (ref 98–111)
Creatinine, Ser: 0.86 mg/dL (ref 0.61–1.24)
GFR calc Af Amer: >60 mL/min (ref >60)
GFR calc non Af Amer: >60 mL/min (ref >60)
Glucose, Bld: 95 mg/dL (ref 70–99)
Potassium: 4.3 mmol/L (ref 3.5–5.1)
Sodium: 135 mmol/L (ref 135–145)

## 2019-04-03 LAB — PREPARE RBC (CROSSMATCH)

## 2019-04-03 MED ORDER — CIPROFLOXACIN IN D5W 400 MG/200ML IV SOLN
400.0000 mg | INTRAVENOUS | Status: AC
  Administered 2019-04-04: 400 mg via INTRAVENOUS
  Filled 2019-04-03 (×2): qty 200

## 2019-04-03 MED ORDER — FUROSEMIDE 40 MG PO TABS
40.0000 mg | ORAL_TABLET | Freq: Every day | ORAL | Status: DC
  Administered 2019-04-03: 40 mg via ORAL
  Filled 2019-04-03: qty 1

## 2019-04-03 MED ORDER — CHLORHEXIDINE GLUCONATE CLOTH 2 % EX PADS
6.0000 | MEDICATED_PAD | Freq: Once | CUTANEOUS | Status: AC
  Administered 2019-04-03: 6 via TOPICAL

## 2019-04-03 MED ORDER — CHLORHEXIDINE GLUCONATE CLOTH 2 % EX PADS
6.0000 | MEDICATED_PAD | Freq: Once | CUTANEOUS | Status: AC
  Administered 2019-04-04: 6 via TOPICAL

## 2019-04-03 MED ORDER — VANCOMYCIN HCL IN DEXTROSE 1-5 GM/200ML-% IV SOLN: 1000.0000 mg | INTRAVENOUS | Status: DC

## 2019-04-03 NOTE — Progress Notes (Signed)
Pharmacy Antibiotic Note  [name] [name] is a 58 y.o. male admitted on 03/18/2019 with sepsis.  New purulent drainage noted from sternal wound, to OR for debridement and VAC placement on 3/24, underwent wound VAC change 3/31. Pt to return to OR on Monday for VAC drainage, to continue antibiotics until then.  Plan: Cipro 582m PO BID Vancomycin 12578mIV q12h  Height: 5' 8"  (172.7 cm) Weight: 213 lb 13.5 oz (97 kg) IBW/kg (Calculated) : 68.4  Temp (24hrs), Avg:98.8 F (37.1 C), Min:98.5 F (36.9 C), Max:99.1 F (37.3 C)  Recent Labs  Lab 03/30/19 0433 03/30/19 1108 03/30/19 1829 03/31/19 0255 04/01/19 0453 04/02/19 0635 04/03/19 0417  WBC 10.5  --   --  10.3 12.2* 11.8* 11.1*  CREATININE 0.82  --   --  1.07 1.11 0.99 0.86  VANCOTROUGH  --   --  15  --   --   --   --   VANCOPEAK  --  28*  --   --   --   --   --     Estimated Creatinine Clearance: 105.7 mL/min (by C-G formula based on SCr of 0.86 mg/dL).    Allergies  Allergen Reactions  . Penicillins Hives, Rash and Other (See Comments)    Did it involve swelling of the face/tongue/throat, SOB, or low BP? Yes Did it involve sudden or severe rash/hives, skin peeling, or any reaction on the inside of your mouth or nose? Yes  Did you need to seek medical attention at a hospital or doctor's office? Yes When did it last happen?As a child. If all above answers are "NO", may proceed with cephalosporin use.    Antimicrobials this admission: Vanc 3/20 >>    Aztreonam 3/20>>3/27 Cipro 3/27> Flagyl 3/20 >> 3/22  Antibiotic Adjustments This Admission:  4/1: VP 28, VT 15, AUC 576 - reduce to 1250 mg IV q12 (estAUC 479)  Microbiology results: 3/24 Wound cx: ngtd 3/20 BCx: ng 3/20 UCx:  ng RSV panel: neg MRSA PCR: neg   Thank you for allowing pharmacy to be a part of this patient's care.  MiArrie SenatePharmD, BCPS Clinical Pharmacist [phone] check AMION for all [facility] 04/03/2019

## 2019-04-03 NOTE — Progress Notes (Signed)
3 Days Post-Op Procedure(s) (LRB): STERNAL WOUND DEBRIDEMENT USING 1G ACEL POWDER (N/A) WOUND VAC CHANGE (N/A) Subjective: Stable night, incisional pain slightly better 125 cc serosanguineous wound VAC drainage yesterday ABRA bands tightened today to approximate chest wall soft tissue Afebrile, hemoglobin stable Operative cultures remain negative  Plan OR in a.m. for reconstruction of sternal wound with muscle flap with Dr. Marla Roe.  Procedure benefits and risks discussed with patient.  Objective: Vital signs in last 24 hours: Temp:  [98.5 F (36.9 C)-99.1 F (37.3 C)] 98.9 F (37.2 C) (04/05 0759) Pulse Rate:  [59-101] 90 (04/05 0700) Cardiac Rhythm: Normal sinus rhythm;Bundle branch block (04/05 0400) Resp:  [18-20] 18 (04/04 1534) BP: (109-146)/(53-84) 128/65 (04/05 0700) SpO2:  [91 %-98 %] 97 % (04/05 0700) Weight:  [97 kg] 97 kg (04/05 0600)  Hemodynamic parameters for last 24 hours:  Sinus rhythm stable blood pressure  Intake/Output from previous day: 04/04 0701 - 04/05 0700 In: 1492.2 [P.O.:990; I.V.:2.2; IV Piggyback:500] Out: 2775 [Urine:2650; Drains:125] Intake/Output this shift: No intake/output data recorded.  Sitting up in chair alert and comfortable Lungs clear Wound VAC compressed Heart rhythm regular no murmur Abdomen soft Extremities warm with minimal edema  Lab Results: Recent Labs    04/02/19 0635 04/03/19 0417  WBC 11.8* 11.1*  HGB 10.0* 9.4*  HCT 34.0* 31.6*  PLT 470* 403*   BMET:  Recent Labs    04/02/19 0635 04/03/19 0417  NA 135 135  K 4.4 4.3  CL 98 101  CO2 27 27  GLUCOSE 93 95  BUN 30* 23*  CREATININE 0.99 0.86  CALCIUM 9.6 9.4    PT/INR: No results for input(s): LABPROT, INR in the last 72 hours. ABG    Component Value Date/Time   PHART 7.451 (H) 03/18/2019 0329   HCO3 24.1 03/18/2019 0329   TCO2 25 03/18/2019 0329   ACIDBASEDEF 3.0 (H) 02/24/2019 0155   O2SAT 91.0 03/18/2019 0329   CBG (last 3)  No results  for input(s): GLUCAP in the last 72 hours.  Assessment/Plan: S/P Procedure(s) (LRB): STERNAL WOUND DEBRIDEMENT USING 1G ACEL POWDER (N/A) WOUND VAC CHANGE (N/A) Orders in place for OR in a.m.-sternal wound reconstruction with muscle flap coverage   LOS: 16 days    [name] [name] 04/03/2019

## 2019-04-03 NOTE — Progress Notes (Signed)
CT surgery p.m. Rounds  Patient had comfortable today Minimal serosanguineous drainage from wound VAC Vital signs stable Ready for sternal reconstruction with muscle flap in a.m.

## 2019-04-03 NOTE — Progress Notes (Signed)
Spoke with blood bank via phone. Informed that they have two units prepared for pt's surgery tomorrow morning.

## 2019-04-04 ENCOUNTER — Inpatient Hospital Stay (HOSPITAL_COMMUNITY): Payer: Medicare Other | Admitting: Certified Registered"

## 2019-04-04 ENCOUNTER — Encounter (HOSPITAL_COMMUNITY)
Admission: EM | Disposition: A | Discharge status: Home Health | Payer: Self-pay | Source: Home / Self Care | Attending: Cardiothoracic Surgery

## 2019-04-04 ENCOUNTER — Encounter (HOSPITAL_COMMUNITY): Payer: Self-pay | Admitting: Certified Registered Nurse Anesthetist

## 2019-04-04 ENCOUNTER — Inpatient Hospital Stay (HOSPITAL_COMMUNITY): Payer: Medicare Other

## 2019-04-04 DIAGNOSIS — S21109A Unspecified open wound of unspecified front wall of thorax without penetration into thoracic cavity, initial encounter: Secondary | ICD-10-CM

## 2019-04-04 DIAGNOSIS — T8149XA Infection following a procedure, other surgical site, initial encounter: Secondary | ICD-10-CM

## 2019-04-04 DIAGNOSIS — I9789 Other postprocedural complications and disorders of the circulatory system, not elsewhere classified: Secondary | ICD-10-CM

## 2019-04-04 HISTORY — PX: PECTORALIS FLAP: SHX6228

## 2019-04-04 HISTORY — PX: STERNAL WOUND DEBRIDEMENT: SHX1058

## 2019-04-04 LAB — CBC
HCT: 32.5 % — ABNORMAL LOW (ref 39.0–52.0)
Hemoglobin: 9.7 g/dL — ABNORMAL LOW (ref 13.0–17.0)
MCH: 26.6 pg (ref 26.0–34.0)
MCHC: 29.8 g/dL — ABNORMAL LOW (ref 30.0–36.0)
MCV: 89 fL (ref 80.0–100.0)
Platelets: 389 10*3/uL (ref 150–400)
RBC: 3.65 MIL/uL — ABNORMAL LOW (ref 4.22–5.81)
RDW: 15.2 % (ref 11.5–15.5)
WBC: 9.7 10*3/uL (ref 4.0–10.5)
nRBC: 0 % (ref 0.0–0.2)

## 2019-04-04 LAB — BASIC METABOLIC PANEL
Anion gap: 8 (ref 5–15)
BUN: 21 mg/dL — ABNORMAL HIGH (ref 6–20)
CO2: 27 mmol/L (ref 22–32)
Calcium: 9.4 mg/dL (ref 8.9–10.3)
Chloride: 101 mmol/L (ref 98–111)
Creatinine, Ser: 0.87 mg/dL (ref 0.61–1.24)
GFR calc Af Amer: >60 mL/min (ref >60)
GFR calc non Af Amer: >60 mL/min (ref >60)
Glucose, Bld: 106 mg/dL — ABNORMAL HIGH (ref 70–99)
Potassium: 4 mmol/L (ref 3.5–5.1)
Sodium: 136 mmol/L (ref 135–145)

## 2019-04-04 SURGERY — ADVANCEMENT, FLAP, PECTORALIS
Anesthesia: General | Site: Chest

## 2019-04-04 MED ORDER — ROCURONIUM BROMIDE 50 MG/5ML IV SOSY
PREFILLED_SYRINGE | INTRAVENOUS | Status: AC
  Filled 2019-04-04: qty 5

## 2019-04-04 MED ORDER — ONDANSETRON HCL 4 MG/2ML IJ SOLN
INTRAMUSCULAR | Status: DC | PRN
  Administered 2019-04-04: 4 mg via INTRAVENOUS

## 2019-04-04 MED ORDER — SUCCINYLCHOLINE CHLORIDE 200 MG/10ML IV SOSY
PREFILLED_SYRINGE | INTRAVENOUS | Status: AC
  Filled 2019-04-04: qty 10

## 2019-04-04 MED ORDER — FENTANYL CITRATE (PF) 250 MCG/5ML IJ SOLN
INTRAMUSCULAR | Status: AC
  Filled 2019-04-04: qty 5

## 2019-04-04 MED ORDER — SUGAMMADEX SODIUM 200 MG/2ML IV SOLN
INTRAVENOUS | Status: DC | PRN
  Administered 2019-04-04: 200 mg via INTRAVENOUS

## 2019-04-04 MED ORDER — ONDANSETRON HCL 4 MG/2ML IJ SOLN: 4.0000 mg | Freq: Four times a day (QID) | INTRAMUSCULAR | Status: DC | PRN

## 2019-04-04 MED ORDER — VANCOMYCIN HCL 1000 MG IV SOLR
INTRAVENOUS | Status: AC
  Administered 2019-04-04: 1000 mL
  Filled 2019-04-04: qty 1000

## 2019-04-04 MED ORDER — POTASSIUM CHLORIDE 10 MEQ/50ML IV SOLN: 10.0000 meq | Freq: Every day | INTRAVENOUS | Status: DC | PRN

## 2019-04-04 MED ORDER — FENTANYL CITRATE (PF) 100 MCG/2ML IJ SOLN: 25.0000 ug | INTRAMUSCULAR | Status: DC | PRN

## 2019-04-04 MED ORDER — MIDAZOLAM HCL 5 MG/5ML IJ SOLN
INTRAMUSCULAR | Status: DC | PRN
  Administered 2019-04-04: 2 mg via INTRAVENOUS

## 2019-04-04 MED ORDER — PROPOFOL 10 MG/ML IV BOLUS
INTRAVENOUS | Status: DC | PRN
  Administered 2019-04-04: 20 mg via INTRAVENOUS
  Administered 2019-04-04: 150 mg via INTRAVENOUS

## 2019-04-04 MED ORDER — BISACODYL 5 MG PO TBEC
10.0000 mg | DELAYED_RELEASE_TABLET | Freq: Every day | ORAL | Status: DC
  Administered 2019-04-04 – 2019-04-06 (×2): 10 mg via ORAL
  Filled 2019-04-04 (×3): qty 2

## 2019-04-04 MED ORDER — PROPOFOL 10 MG/ML IV BOLUS
INTRAVENOUS | Status: AC
  Filled 2019-04-04: qty 20

## 2019-04-04 MED ORDER — LABETALOL HCL 5 MG/ML IV SOLN: 20.0000 mg | INTRAVENOUS | Status: DC | PRN

## 2019-04-04 MED ORDER — ROCURONIUM BROMIDE 50 MG/5ML IV SOSY
PREFILLED_SYRINGE | INTRAVENOUS | Status: DC | PRN
  Administered 2019-04-04: 20 mg via INTRAVENOUS
  Administered 2019-04-04 (×2): 50 mg via INTRAVENOUS

## 2019-04-04 MED ORDER — SODIUM CHLORIDE 0.9 % IV SOLN
INTRAVENOUS | Status: AC
  Filled 2019-04-04: qty 500000

## 2019-04-04 MED ORDER — ACETAMINOPHEN 10 MG/ML IV SOLN: 1000.0000 mg | Freq: Once | INTRAVENOUS | Status: DC | PRN

## 2019-04-04 MED ORDER — LACTATED RINGERS IV SOLN
INTRAVENOUS | Status: DC | PRN
  Administered 2019-04-04 (×2): via INTRAVENOUS

## 2019-04-04 MED ORDER — SENNOSIDES-DOCUSATE SODIUM 8.6-50 MG PO TABS
1.0000 | ORAL_TABLET | Freq: Every day | ORAL | Status: DC
  Administered 2019-04-04 – 2019-04-06 (×3): 1 via ORAL
  Filled 2019-04-04 (×3): qty 1

## 2019-04-04 MED ORDER — ESMOLOL HCL 100 MG/10ML IV SOLN
INTRAVENOUS | Status: AC
  Filled 2019-04-04: qty 10

## 2019-04-04 MED ORDER — FENTANYL CITRATE (PF) 250 MCG/5ML IJ SOLN
INTRAMUSCULAR | Status: DC | PRN
  Administered 2019-04-04 (×4): 50 ug via INTRAVENOUS
  Administered 2019-04-04 (×2): 100 ug via INTRAVENOUS

## 2019-04-04 MED ORDER — FUROSEMIDE 10 MG/ML IJ SOLN
40.0000 mg | Freq: Every day | INTRAMUSCULAR | Status: DC
  Administered 2019-04-04 – 2019-04-06 (×3): 40 mg via INTRAVENOUS
  Filled 2019-04-04 (×4): qty 4

## 2019-04-04 MED ORDER — MEPERIDINE HCL 50 MG/ML IJ SOLN: 6.2500 mg | INTRAMUSCULAR | Status: DC | PRN

## 2019-04-04 MED ORDER — LIDOCAINE 2% (20 MG/ML) 5 ML SYRINGE
INTRAMUSCULAR | Status: AC
  Filled 2019-04-04: qty 5

## 2019-04-04 MED ORDER — DEXAMETHASONE SODIUM PHOSPHATE 10 MG/ML IJ SOLN
INTRAMUSCULAR | Status: DC | PRN
  Administered 2019-04-04: 10 mg via INTRAVENOUS

## 2019-04-04 MED ORDER — OXYCODONE HCL 5 MG PO TABS: 5.0000 mg | ORAL_TABLET | Freq: Once | ORAL | Status: DC | PRN

## 2019-04-04 MED ORDER — SUCCINYLCHOLINE CHLORIDE 200 MG/10ML IV SOSY
PREFILLED_SYRINGE | INTRAVENOUS | Status: DC | PRN
  Administered 2019-04-04: 100 mg via INTRAVENOUS

## 2019-04-04 MED ORDER — LACTATED RINGERS IV SOLN
INTRAVENOUS | Status: DC | PRN
  Administered 2019-04-04: 07:00:00 via INTRAVENOUS

## 2019-04-04 MED ORDER — ACETAMINOPHEN 160 MG/5ML PO SOLN: 1000.0000 mg | Freq: Four times a day (QID) | ORAL | Status: DC

## 2019-04-04 MED ORDER — LACTATED RINGERS IV SOLN: INTRAVENOUS | Status: DC

## 2019-04-04 MED ORDER — SODIUM CHLORIDE 0.9 % IV SOLN
INTRAVENOUS | Status: DC | PRN
  Administered 2019-04-04: 20 ug/min via INTRAVENOUS

## 2019-04-04 MED ORDER — ACETAMINOPHEN 325 MG PO TABS: 325.0000 mg | ORAL_TABLET | Freq: Once | ORAL | Status: DC

## 2019-04-04 MED ORDER — ACETAMINOPHEN 160 MG/5ML PO SOLN: 325.0000 mg | Freq: Once | ORAL | Status: DC

## 2019-04-04 MED ORDER — ONDANSETRON HCL 4 MG/2ML IJ SOLN
INTRAMUSCULAR | Status: AC
  Filled 2019-04-04: qty 2

## 2019-04-04 MED ORDER — SODIUM CHLORIDE 0.9 % IR SOLN
Status: DC | PRN
  Administered 2019-04-04: 2000 mL

## 2019-04-04 MED ORDER — MIDAZOLAM HCL 2 MG/2ML IJ SOLN
INTRAMUSCULAR | Status: AC
  Filled 2019-04-04: qty 2

## 2019-04-04 MED ORDER — OXYCODONE HCL 5 MG/5ML PO SOLN: 5.0000 mg | Freq: Once | ORAL | Status: DC | PRN

## 2019-04-04 MED ORDER — LABETALOL HCL 5 MG/ML IV SOLN
INTRAVENOUS | Status: AC
  Filled 2019-04-04: qty 4

## 2019-04-04 MED ORDER — ACETAMINOPHEN 500 MG PO TABS
1000.0000 mg | ORAL_TABLET | Freq: Four times a day (QID) | ORAL | Status: DC
  Administered 2019-04-04 – 2019-04-07 (×9): 1000 mg via ORAL
  Filled 2019-04-04 (×11): qty 2

## 2019-04-04 MED ORDER — PROMETHAZINE HCL 25 MG/ML IJ SOLN: 6.2500 mg | INTRAMUSCULAR | Status: DC | PRN

## 2019-04-04 MED ORDER — DEXAMETHASONE SODIUM PHOSPHATE 10 MG/ML IJ SOLN
INTRAMUSCULAR | Status: AC
  Filled 2019-04-04: qty 1

## 2019-04-04 MED ORDER — ESMOLOL HCL 100 MG/10ML IV SOLN
INTRAVENOUS | Status: DC | PRN
  Administered 2019-04-04: 30 mg via INTRAVENOUS

## 2019-04-04 SURGICAL SUPPLY — 111 items
APPLIER CLIP 9.375 MED OPEN (MISCELLANEOUS)
ATCH SMKEVC FLXB CAUT HNDSWH (FILTER) ×2 IMPLANT
ATTRACTOMAT 16X20 MAGNETIC DRP (DRAPES) ×2 IMPLANT
BAG DECANTER FOR FLEXI CONT (MISCELLANEOUS) ×6 IMPLANT
BENZOIN TINCTURE PRP APPL 2/3 (GAUZE/BANDAGES/DRESSINGS) IMPLANT
BIOPATCH RED 1 DISK 7.0 (GAUZE/BANDAGES/DRESSINGS) ×2 IMPLANT
BIOPATCH RED 1IN DISK 7.0MM (GAUZE/BANDAGES/DRESSINGS)
BLADE 10 SAFETY STRL DISP (BLADE) ×4 IMPLANT
BLADE SURG 10 STRL SS (BLADE) ×4 IMPLANT
BLADE SURG 15 STRL LF DISP TIS (BLADE) ×2 IMPLANT
BLADE SURG 15 STRL SS (BLADE) ×2
BNDG GAUZE ELAST 4 BULKY (GAUZE/BANDAGES/DRESSINGS) IMPLANT
BRUSH SCRUB EZ PLAIN DRY (MISCELLANEOUS) ×6 IMPLANT
CANISTER SUCT 3000ML PPV (MISCELLANEOUS) ×6 IMPLANT
CATH FOLEY 2WAY SLVR  5CC 16FR (CATHETERS)
CATH FOLEY 2WAY SLVR 5CC 16FR (CATHETERS) IMPLANT
CATH THORACIC 28FR RT ANG (CATHETERS) IMPLANT
CATH THORACIC 36FR (CATHETERS) IMPLANT
CATH THORACIC 36FR RT ANG (CATHETERS) IMPLANT
CHLORAPREP W/TINT 26ML (MISCELLANEOUS) ×4 IMPLANT
CLIP APPLIE 9.375 MED OPEN (MISCELLANEOUS) IMPLANT
CLIP VESOCCLUDE MED 6/CT (CLIP) ×4 IMPLANT
CLIP VESOCCLUDE SM WIDE 24/CT (CLIP) IMPLANT
CLIP VESOCCLUDE SM WIDE 6/CT (CLIP) ×2 IMPLANT
CLOSURE WOUND 1/2 X4 (GAUZE/BANDAGES/DRESSINGS) ×1
CONN Y 3/8X3/8X3/8  BEN (MISCELLANEOUS)
CONN Y 3/8X3/8X3/8 BEN (MISCELLANEOUS) IMPLANT
CONT SPEC 4OZ CLIKSEAL STRL BL (MISCELLANEOUS) IMPLANT
COVER SURGICAL LIGHT HANDLE (MISCELLANEOUS) ×8 IMPLANT
COVER WAND RF STERILE (DRAPES) ×4 IMPLANT
DRAIN CHANNEL 19F RND (DRAIN) ×4 IMPLANT
DRAPE HALF SHEET 40X57 (DRAPES) ×4 IMPLANT
DRAPE INCISE 23X17 IOBAN STRL (DRAPES) ×2
DRAPE INCISE 23X17 STRL (DRAPES) ×2 IMPLANT
DRAPE INCISE IOBAN 23X17 STRL (DRAPES) ×2 IMPLANT
DRAPE LAPAROSCOPIC ABDOMINAL (DRAPES) ×2 IMPLANT
DRAPE ORTHO SPLIT 77X108 STRL (DRAPES) ×4
DRAPE SURG 17X23 STRL (DRAPES) ×16 IMPLANT
DRAPE SURG ORHT 6 SPLT 77X108 (DRAPES) ×4 IMPLANT
DRAPE WARM FLUID 44X44 (DRAPE) ×2 IMPLANT
DRSG AQUACEL AG ADV 3.5X14 (GAUZE/BANDAGES/DRESSINGS) ×2 IMPLANT
DRSG PAD ABDOMINAL 8X10 ST (GAUZE/BANDAGES/DRESSINGS) ×4 IMPLANT
ELECT BLADE 4.0 EZ CLEAN MEGAD (MISCELLANEOUS) ×4
ELECT BLADE 6.5 EXT (BLADE) IMPLANT
ELECT CAUTERY BLADE 6.4 (BLADE) ×2 IMPLANT
ELECT REM PT RETURN 9FT ADLT (ELECTROSURGICAL) ×8
ELECTRODE BLDE 4.0 EZ CLN MEGD (MISCELLANEOUS) IMPLANT
ELECTRODE REM PT RTRN 9FT ADLT (ELECTROSURGICAL) ×4 IMPLANT
EVACUATOR SILICONE 100CC (DRAIN) ×4 IMPLANT
EVACUATOR SMOKE ACCUVAC VALLEY (FILTER) ×2
GAUZE SPONGE 4X4 12PLY STRL (GAUZE/BANDAGES/DRESSINGS) ×6 IMPLANT
GAUZE XEROFORM 5X9 LF (GAUZE/BANDAGES/DRESSINGS) IMPLANT
GLOVE BIO SURGEON STRL SZ 6.5 (GLOVE) ×4 IMPLANT
GLOVE BIO SURGEON STRL SZ7.5 (GLOVE) ×4 IMPLANT
GLOVE BIO SURGEONS STRL SZ 6.5 (GLOVE) ×2
GOWN STRL REUS W/ TWL LRG LVL3 (GOWN DISPOSABLE) ×12 IMPLANT
GOWN STRL REUS W/TWL LRG LVL3 (GOWN DISPOSABLE) ×10
HANDPIECE INTERPULSE COAX TIP (DISPOSABLE)
HEMOSTAT POWDER SURGIFOAM 1G (HEMOSTASIS) IMPLANT
HEMOSTAT SURGICEL 2X14 (HEMOSTASIS) IMPLANT
KIT BASIN OR (CUSTOM PROCEDURE TRAY) ×8 IMPLANT
KIT PREVENA INCISION MGT20CM45 (CANNISTER) ×2 IMPLANT
KIT SUCTION CATH 14FR (SUCTIONS) IMPLANT
KIT TURNOVER KIT B (KITS) ×8 IMPLANT
MARKER SKIN DUAL TIP RULER LAB (MISCELLANEOUS) ×4 IMPLANT
NS IRRIG 1000ML POUR BTL (IV SOLUTION) ×12 IMPLANT
PACK CHEST (CUSTOM PROCEDURE TRAY) ×2 IMPLANT
PACK GENERAL/GYN (CUSTOM PROCEDURE TRAY) ×4 IMPLANT
PAD ARMBOARD 7.5X6 YLW CONV (MISCELLANEOUS) ×16 IMPLANT
PENCIL BUTTON HOLSTER BLD 10FT (ELECTRODE) ×2 IMPLANT
PREFILTER EVAC NS 1 1/3-3/8IN (MISCELLANEOUS) ×4 IMPLANT
SET ADHESIVE SKIN CLSR ABRA (MISCELLANEOUS) ×8 IMPLANT
SET HNDPC FAN SPRY TIP SCT (DISPOSABLE) IMPLANT
SOL PREP POV-IOD 4OZ 10% (MISCELLANEOUS) ×2 IMPLANT
SPONGE LAP 18X18 RF (DISPOSABLE) ×6 IMPLANT
STAPLER VISISTAT 35W (STAPLE) ×2 IMPLANT
STRAP MONTGOMERY 1.25X11-1/8 (MISCELLANEOUS) IMPLANT
STRIP CLOSURE SKIN 1/2X4 (GAUZE/BANDAGES/DRESSINGS) ×3 IMPLANT
SUT ETHILON 3 0 FSL (SUTURE) IMPLANT
SUT MNCRL AB 3-0 PS2 18 (SUTURE) ×10 IMPLANT
SUT MNCRL AB 4-0 PS2 18 (SUTURE) ×4 IMPLANT
SUT MON AB 2-0 CT1 36 (SUTURE) IMPLANT
SUT MON AB 5-0 PS2 18 (SUTURE) ×4 IMPLANT
SUT PDS AB 0 CT 36 (SUTURE) IMPLANT
SUT PROLENE 3 0 PS 1 (SUTURE) IMPLANT
SUT SILK 3 0 SH CR/8 (SUTURE) ×2 IMPLANT
SUT STEEL 6MS V (SUTURE) IMPLANT
SUT STEEL STERNAL CCS#1 18IN (SUTURE) IMPLANT
SUT STEEL SZ 6 DBL 3X14 BALL (SUTURE) IMPLANT
SUT VIC AB 1 CTX 36 (SUTURE) ×4
SUT VIC AB 1 CTX36XBRD ANBCTR (SUTURE) ×4 IMPLANT
SUT VIC AB 2-0 CTX 27 (SUTURE) ×8 IMPLANT
SUT VIC AB 3-0 FS2 27 (SUTURE) IMPLANT
SUT VIC AB 3-0 SH 8-18 (SUTURE) IMPLANT
SUT VIC AB 3-0 X1 27 (SUTURE) ×8 IMPLANT
SWAB COLLECTION DEVICE MRSA (MISCELLANEOUS) IMPLANT
SWAB CULTURE ESWAB REG 1ML (MISCELLANEOUS) IMPLANT
SYR 50ML SLIP (SYRINGE) IMPLANT
SYR 5ML LL (SYRINGE) IMPLANT
SYR BULB IRRIGATION 50ML (SYRINGE) ×4 IMPLANT
TAPE CLOTH SURG 4X10 WHT LF (GAUZE/BANDAGES/DRESSINGS) ×2 IMPLANT
TOWEL GREEN STERILE (TOWEL DISPOSABLE) ×4 IMPLANT
TOWEL GREEN STERILE FF (TOWEL DISPOSABLE) ×4 IMPLANT
TOWEL OR 17X24 6PK STRL BLUE (TOWEL DISPOSABLE) ×4 IMPLANT
TOWEL OR 17X26 10 PK STRL BLUE (TOWEL DISPOSABLE) ×4 IMPLANT
TRAY FOLEY MTR SLVR 14FR STAT (SET/KITS/TRAYS/PACK) ×4 IMPLANT
TUBE ANAEROBIC SPECIMEN COL (MISCELLANEOUS) ×2 IMPLANT
TUBE CONNECTING 12'X1/4 (SUCTIONS) ×1
TUBE CONNECTING 12X1/4 (SUCTIONS) ×3 IMPLANT
WATER STERILE IRR 1000ML POUR (IV SOLUTION) ×4 IMPLANT
YANKAUER SUCT BULB TIP NO VENT (SUCTIONS) ×2 IMPLANT

## 2019-04-04 NOTE — H&P (Signed)
TAISHAUN LEVELS is an 58 y.o. male.   Chief Complaint: sternal wound HPI: The patient is a 58 yrs old wm here for treatment of his sternal wound.  He was treated for unstable angina with a left heart catheterization for 90% stenosis of the proximal RCA.  The aortic root dissected and he was taken for acute ascending thoracic aortic dissection with single vessel CABG with SVG to RCA.  He was discharge with Aspirin and amiodarone.  He returned 3/20 for chest pain and shortness of breast with a sternal wound. On 3/24 he underwent debridement and VAC placement and antibiotic. He presents now for closure.   Past Medical History:  Diagnosis Date   Adenomatous colon polyp 2009   Alcohol abuse    abstinent since 1992   Arthritis    back   Asthma    Barrett's esophagus 05/22/2015   Chronic back pain    COPD (chronic obstructive pulmonary disease) ([place])    Coronary artery disease    a. cath in 2010 showing 20% distal LM, 70-80% Ost LAD, 30% OM, and 40% RCA b. low-risk NST in 2014 and 2016   GERD (gastroesophageal reflux disease)    takes Nexium daily   Gout    takes ALlopurinol daily   Hemorrhoids    History of bronchitis 2015   History of kidney stones    Hyperlipidemia    was given a script a month ago but scared to take it.Medical Md is aware   Hypertension    takes Losartan,Metoprolol,and Amlodipine daily   Joint pain    Lymphocytic colitis 2009   Myocardial infarction [facility]) 2010   "light" (admit for CP 11/2009; ruled out for MI but had 70-80% ostail LAD stenosis and treated medically following NL perfusion study)   NASH (nonalcoholic steatohepatitis)    Peripheral vascular disease ([place])    Personal history of colonic polyps 07/20/2002   Diminutive adenomas (3) 06/2002 diminutive adenoma (1) 2011    Pre-diabetes    Stroke [facility])    TIA (transient ischemic attack)    Urinary urgency     Past Surgical History:  Procedure Laterality Date   25 GAUGE PARS  PLANA VITRECTOMY WITH 20 GAUGE MVR PORT N/A 03/25/2019   Procedure: WOUND VAC CHANGE AND IRRIGATION;  Surgeon: Ivin Poot, MD;  Location: [place];  Service: Thoracic;  Laterality: N/A;   ANEURYSM COILING  12/2010   cerebral   APPLICATION OF WOUND VAC N/A 03/22/2019   Procedure: APPLICATION OF WOUND VAC;  Surgeon: Ivin Poot, MD;  Location: [place];  Service: Thoracic;  Laterality: N/A;   APPLICATION OF WOUND VAC N/A 03/29/2019   Procedure: WOUND VAC CHANGE;  Surgeon: Ivin Poot, MD;  Location: [place];  Service: Thoracic;  Laterality: N/A;   APPLICATION OF WOUND VAC N/A 03/31/2019   Procedure: WOUND VAC CHANGE;  Surgeon: Ivin Poot, MD;  Location: [place];  Service: Thoracic;  Laterality: N/A;   BAND HEMORRHOIDECTOMY  2003   at sigmoidoscopy   CARDIAC CATHETERIZATION  2010   CHOLECYSTECTOMY  08/03/2015   CHOLECYSTECTOMY N/A 08/03/2015   Procedure: LAPAROSCOPIC CHOLECYSTECTOMY WITH INTRAOPERATIVE CHOLANGIOGRAM;  Surgeon: Fanny Skates, MD;  Location: [place];  Service: General;  Laterality: N/A;   COLONOSCOPY  multiple   CORONARY ARTERY BYPASS GRAFT N/A 02/23/2019   Procedure: CORONARY ARTERY BYPASS GRAFTING (CABG) x 1; Using Endoscopically Harvested Right Leg Greater Saphenous Vein Graft (SVG); SVG to RCA;  Surgeon: Ivin Poot, MD;  Location:  [place] [facility];  Service: Open Heart Surgery;  Laterality: N/A;   CORONARY STENT INTERVENTION N/A 02/23/2019   Procedure: CORONARY STENT INTERVENTION;  Surgeon: Martinique, Peter M, MD;  Location: [place] [facility];  Service: Cardiovascular;  Laterality: N/A;   ESOPHAGOGASTRODUODENOSCOPY     KNEE ARTHROSCOPY WITH MEDIAL MENISECTOMY Left 05/20/2017   Procedure: LEFT KNEE ARTHROSCOPY WITH PARTIAL MEDIAL MENISCECTOMY;  Surgeon: Leandrew Koyanagi, MD;  Location: [place];  Service: Orthopedics;  Laterality: Left;   LEFT HEART CATH AND CORONARY ANGIOGRAPHY N/A 02/23/2019   Procedure: LEFT HEART CATH AND CORONARY ANGIOGRAPHY;  Surgeon:  Martinique, Peter M, MD;  Location: [place] [facility];  Service: Cardiovascular;  Laterality: N/A;   LUMBAR SPINE SURGERY     x 2   NASAL SINUS SURGERY     REPAIR OF ACUTE ASCENDING THORACIC AORTIC DISSECTION N/A 02/23/2019   Procedure: REPAIR OF TYPE A  - ACUTE ASCENDING THORACIC AORTIC DISSECTION, using Hemashield Platinum Woven Double Velour Vascular Graft (D: 45m, L: 30cm);  Surgeon: VPrescott Gum PCollier Salina MD;  [place]  Service: Vascular;  Laterality: N/A;   STERNAL WOUND DEBRIDEMENT N/A 03/22/2019   Procedure: STERNAL WOUND DEBRIDEMENT;  Surgeon: VIvin Poot MD;  [place]  Service: Thoracic;  Laterality: N/A;   STERNAL WOUND DEBRIDEMENT N/A 03/25/2019   Procedure: SUPERFICIAL STERNAL WOUND DEBRIDEMENT;  Surgeon: VIvin Poot MD;  [place]  Service: Thoracic;  Laterality: N/A;   STERNAL WOUND DEBRIDEMENT N/A 03/29/2019   Procedure: SUPERFICIAL STERNAL WOUND DEBRIDEMENT;  Surgeon: VIvin Poot MD;  [place]  Service: Thoracic;  Laterality: N/A;   STERNAL WOUND DEBRIDEMENT N/A 03/31/2019   Procedure: STERNAL WOUND DEBRIDEMENT USING 1G ACEL POWDER;  Surgeon: VIvin Poot MD;  [place]  Service: Thoracic;  Laterality: N/A;   TEE WITHOUT CARDIOVERSION  05/28/2012   Procedure: TRANSESOPHAGEAL ECHOCARDIOGRAM (TEE);  Surgeon: BLelon Perla MD;  [facility]  Service: Cardiovascular;  Laterality: N/A;   TEE WITHOUT CARDIOVERSION N/A 02/23/2019   Procedure: TRANSESOPHAGEAL ECHOCARDIOGRAM (TEE);  Surgeon: VPrescott Gum PCollier Salina MD;  [place]  Service: Open Heart Surgery;  Laterality: N/A;    Family History  Problem Relation Age of Onset   Liver disease Mother    Liver cancer Mother    Breast cancer Mother    Cancer Mother    Hypertension Mother    Heart attack Mother    Cancer Father    Heart disease Father        before age 59  Hyperlipidemia Father    Hypertension Father    Heart attack Father    Heart  disease Other        multiple family members on maternal and paternal side of family   Diabetes Sister    Hyperlipidemia Sister    Hypertension Sister    Diabetes Paternal Grandmother    Hypertension Brother    Colon cancer Neg Hx    Social History:  reports that he quit smoking about 3 months ago. His smoking use included cigarettes. He has a 4.00 pack-year smoking history. He has never used smokeless tobacco. He reports that he does not drink alcohol or use drugs.  Allergies:  Allergies  Allergen Reactions   Penicillins Hives, Rash and Other (See Comments)    Did it involve swelling of the face/tongue/throat, SOB, or low BP? Yes Did it involve sudden or severe rash/hives, skin peeling, or any reaction on the inside  of your mouth or nose? Yes  Did you need to seek medical attention at a hospital or doctor's office? Yes When did it last happen?As a child. If all above answers are "NO", may proceed with cephalosporin use.     Medications Prior to Admission  Medication Sig Dispense Refill   acetaminophen (TYLENOL) 325 MG tablet Take 2 tablets (650 mg total) by mouth every 4 (four) hours as needed for headache or mild pain.     albuterol (PROVENTIL HFA;VENTOLIN HFA) 108 (90 Base) MCG/ACT inhaler Inhale 1 puff into the lungs every 6 (six) hours as needed for wheezing or shortness of breath.      allopurinol (ZYLOPRIM) 100 MG tablet Take 100 mg by mouth daily.      amiodarone (PACERONE) 200 MG tablet Take 2 tablets (400 mg total) by mouth every 12 (twelve) hours. Take 2 tablets by mouth twice daily for 7 days then 1 tablet by mouth twice daily thereafter. (Patient taking differently: Take 200 mg by mouth 2 (two) times daily. ) 60 tablet 1   amLODipine (NORVASC) 10 MG tablet Take 1 tablet (10 mg total) by mouth daily. 30 tablet 2   aspirin 325 MG tablet Take 325 mg by mouth daily.      atorvastatin (LIPITOR) 80 MG tablet Take 1 tablet (80 mg total) by mouth daily at 6 PM.  30 tablet 2   carvedilol (COREG) 25 MG tablet Take 1 tablet (25 mg total) by mouth 2 (two) times daily with a meal. 60 tablet 2   esomeprazole (NEXIUM) 40 MG capsule Take 40 mg by mouth 2 (two) times daily.     ipratropium-albuterol (DUONEB) 0.5-2.5 (3) MG/3ML SOLN Take 3 mLs by nebulization every 8 (eight) hours as needed (wheezing, shortness of breath). 360 mL 0   losartan (COZAAR) 100 MG tablet Take 1 tablet (100 mg total) by mouth daily. 30 tablet 2   Multiple Vitamin (MULITIVITAMIN WITH MINERALS) TABS Take 1 tablet by mouth daily.     nitroGLYCERIN (NITROSTAT) 0.4 MG SL tablet Place 0.4 mg under the tongue every 5 (five) minutes as needed. For chest pain     Omega-3 Fatty Acids (FISH OIL) 1200 MG CPDR Take 1,200 mg by mouth daily.     traZODone (DESYREL) 100 MG tablet Take 100 mg by mouth at bedtime.       Results for orders placed or performed during the hospital encounter of 03/18/19 (from the past 48 hour(s))  Basic metabolic panel     Status: Abnormal   Collection Time: 04/03/19  4:17 AM  Result Value Ref Range   Sodium 135 135 - 145 mmol/L   Potassium 4.3 3.5 - 5.1 mmol/L   Chloride 101 98 - 111 mmol/L   CO2 27 22 - 32 mmol/L   Glucose, Bld 95 70 - 99 mg/dL   BUN 23 (H) 6 - 20 mg/dL   Creatinine, Ser 0.86 0.61 - 1.24 mg/dL   Calcium 9.4 8.9 - 10.3 mg/dL   GFR calc non Af Amer >60 >60 mL/min   GFR calc Af Amer >60 >60 mL/min   Anion gap 7 5 - 15    Comment: Performed at [facility], [place] [address] [place] [address]  CBC     Status: Abnormal   Collection Time: 04/03/19  4:17 AM  Result Value Ref Range   WBC 11.1 (H) 4.0 - 10.5 K/uL   RBC 3.49 (L) 4.22 - 5.81 MIL/uL   Hemoglobin 9.4 (L)  13.0 - 17.0 g/dL   HCT 31.6 (L) 39.0 - 52.0 %   MCV 90.5 80.0 - 100.0 fL   MCH 26.9 26.0 - 34.0 pg   MCHC 29.7 (L) 30.0 - 36.0 g/dL   RDW 15.6 (H) 11.5 - 15.5 %   Platelets 403 (H) 150 - 400 K/uL   nRBC 0.0 0.0 - 0.2 %    Comment: Performed at [facility] [address] [place], [place] [address]  Prepare RBC (crossmatch)     Status: None   Collection Time: 04/03/19 10:13 AM  Result Value Ref Range   Order Confirmation      ORDER PROCESSED BY BLOOD BANK Performed at [facility], [place] [address], [place], [place] [address]   Type and screen [place]     Status: None (Preliminary result)   Collection Time: 04/03/19 12:20 PM  Result Value Ref Range   ABO/RH(D) O POS    Antibody Screen NEG    Sample Expiration      04/06/2019 Performed at [facility], [place] [address], [place], [place] [address]    Unit Number [id number]    Blood Component Type RBC LR PHER2    Unit division 00    Status of Unit ALLOCATED    Transfusion Status OK TO TRANSFUSE    Crossmatch Result Compatible    Unit Number [id number]    Blood Component Type RED CELLS,LR    Unit division 00    Status of Unit ALLOCATED    Transfusion Status OK TO TRANSFUSE    Crossmatch Result Compatible   Basic metabolic panel     Status: Abnormal   Collection Time: 04/04/19  3:09 AM  Result Value Ref Range   Sodium 136 135 - 145 mmol/L   Potassium 4.0 3.5 - 5.1 mmol/L   Chloride 101 98 - 111 mmol/L   CO2 27 22 - 32 mmol/L   Glucose, Bld 106 (H) 70 - 99 mg/dL   BUN 21 (H) 6 - 20 mg/dL   Creatinine, Ser 0.87 0.61 - 1.24 mg/dL   Calcium 9.4 8.9 - 10.3 mg/dL   GFR calc non Af Amer >60 >60 mL/min   GFR calc Af Amer >60 >60 mL/min   Anion gap 8 5 - 15    Comment: Performed at [facility], [place] [address], [place], [place] [address]  CBC     Status: Abnormal   Collection Time: 04/04/19  3:09 AM  Result Value Ref Range   WBC 9.7 4.0 - 10.5 K/uL   RBC 3.65 (L) 4.22 - 5.81 MIL/uL   Hemoglobin 9.7 (L) 13.0 - 17.0 g/dL   HCT 32.5 (L) 39.0 - 52.0 %   MCV 89.0 80.0 - 100.0 fL   MCH 26.6 26.0 - 34.0 pg   MCHC 29.8 (L) 30.0 - 36.0 g/dL   RDW 15.2 11.5 - 15.5 %   Platelets 389 150 - 400 K/uL   nRBC 0.0 0.0 - 0.2 %    Comment: Performed at  [facility], [place] [address] [place],  [address]   No results found.  Review of Systems  Constitutional: Negative.   HENT: Negative.   Eyes: Negative.   Respiratory: Negative.   Cardiovascular: Negative.   Gastrointestinal: Negative.   Genitourinary: Negative.   Musculoskeletal: Negative.   Skin: Negative.   Neurological: Negative.   Psychiatric/Behavioral: Negative.     Blood pressure (!) 142/75, pulse 82, temperature  98.7 F (37.1 C), temperature source Oral, resp. rate 17, height 5' 8"  (1.727 m), weight 95.1 kg, SpO2 99 %. Physical Exam  Constitutional: He is oriented to person, place, and time. He appears well-developed and well-nourished.  HENT:  Head: Normocephalic and atraumatic.  Eyes: Pupils are equal, round, and reactive to light. EOM are normal.  Cardiovascular: Normal rate.  Respiratory: Effort normal.    GI: Soft. He exhibits no distension.  Neurological: He is alert and oriented to person, place, and time.  Skin: Skin is warm.  Psychiatric: He has a normal mood and affect. His behavior is normal.     Assessment/Plan Sternal wound: plan for pectoralis muscle flap for closure. The risks that can be encountered with and after the muscle flap were discussed and include the following but not limited to these: bleeding, infection, delayed healing, anesthesia risks, skin sensation changes, injury to structures including nerves, blood vessels, and muscles which may be temporary or permanent, allergies to tape, suture materials and glues, blood products, topical preparations or injected agents, skin contour irregularities, skin discoloration and swelling, deep vein thrombosis, cardiac and pulmonary complications, pain, which may persist, persistent pain, recurrence of the lesion, poor healing of the incision, possible need for revisional surgery or staged procedures.    [place], DO 04/04/2019, 6:57 AM

## 2019-04-04 NOTE — Progress Notes (Signed)
[facility] BRIEF SICU PROGRESS NOTE  Day of Surgery  S/P Procedure(s) (LRB): right pectoralis muscle flap for sternal wound reconstruction and VAC placement (N/A) STERNAL WOUND DEBRIDEMENT (N/A)   Resting comfortably Adequate pain control Breathing comfortably w/ O2 sats 98% NSR w/ stable BP Drain output serosanguinous, relatively low volume UOP excellent  Plan: Continue current plan  Rexene Alberts, MD 04/04/2019 5:21 PM

## 2019-04-04 NOTE — Op Note (Addendum)
DATE OF OPERATION: 04/04/2019  LOCATION: [facility]  PREOPERATIVE DIAGNOSIS: Sternal / Chest wound with infection   POSTOPERATIVE DIAGNOSIS: Same  PROCEDURE: 1. Pectoralis Major Muscle Turnover Flap. 2. VAC and ABRA placement. 3. Debridement / Excision of 1 x 4 cm soft tissue and bone.  SURGEON: Jadalee Westcott Sanger Banyan Goodchild, DO  COSURGEON:  [name], MD  ASSISTANT: [place], RNFA  EBL: less than 150 cc  SPECIMEN: None  DRAINS:  total 71 blake round drains  CONDITION: Stable  COMPLICATIONS: None  INDICATION: The patient, [name] [name], is a 58 y.o. male born on 1961-12-05, is here for treatment for a sternal wound after open heart surgery.  He developed a wound after the surgery.  He has undergone debridement.  He presents for closure.  Risks were discussed in the preop area.   PROCEDURE DETAILS:  The patient was seen on the morning of her surgery and marked. He was given IV antibiotics. The patient was then taken to the operating room and given a general anesthetic. A standard time out was performed and all information was confirmed by those in the room.  SCD's were placed and all key points padded. He was then prepped and draped in the standard sterile fashion. The procedure began by washing out the defect with saline and antibiotic solution.  Dr. Prescott Gum removed several wires and debrided the soft tissue and bone of the sternum for the 1 x 4 cm area using a #10 blade, bovie and curette.  The medial margin of the pectoralis major muscle was identified at the sternal margin.  The skin and soft tissue was lifted off the muscle with dissection carried to the insertion.  The thoracoacromial trunk was identified. The nerve and vessels were clipped and then cut.  The dissection was carried out until all four margins of the muscle were identified. The muscle was then released inferiorly and anteriorly and care was taken not to pick up the serratus anteriorly or pectoralis  minor muscles. The flap was then raised to the insertion and released and rotated medially. Care was taken to protect the medial vasculature throughout the procedure. The muscle looked vascularized throughout the case.     The muscle was then secured to the adjacent fascia within the sternal space with 3-0 Monocryl. A drain was placed on the right side at the inframammary fold and secured with 3-0 Silk. The tip is in the axilla.  A drain was placed at the left inframammary fold and with the tip at the deep portion of the sternum.  This was secured to the skin with the 3-0 Silk. The incision was closed with buried 3-0 Monocryl, followed by 4-0 Monocryl and 5-0 Monocryl. A prevena vac was applied and 4 sets of ABRA straps to minimize the tension at the skin margins.  The patient was allowed to wake up and taken to recovery room in stable condition at the end of the case. The family was notified at the end of the case.   The RNFA assisted throughout the case.  The RNFA was essential in retraction and counter traction when needed to make the case progress smoothly.  This retraction and assistance made it possible to see the tissue plans for the procedure.  The assistance was needed for blood control, tissue re-approximation and assisted with closure of the incision site.

## 2019-04-04 NOTE — Anesthesia Procedure Notes (Addendum)
Central Venous Catheter Insertion Performed by: Albertha Ghee, MD, anesthesiologist Start/End4/05/2019 7:46 AM, 04/04/2019 7:55 AM Patient location: Pre-op. Preanesthetic checklist: patient identified, IV checked, site marked, risks and benefits discussed, surgical consent, monitors and equipment checked, pre-op evaluation, timeout performed and anesthesia consent Position: Trendelenburg Patient sedated Hand hygiene performed  and maximum sterile barriers used  Catheter size: 8 Fr Total catheter length 16. Central line was placed.Double lumen Procedure performed using ultrasound guided technique. Ultrasound Notes:image(s) printed for medical record Attempts: 1 Following insertion, dressing applied, line sutured and Biopatch. Post procedure assessment: blood return through all ports, free fluid flow and no air  Patient tolerated the procedure well with no immediate complications.

## 2019-04-04 NOTE — Anesthesia Postprocedure Evaluation (Signed)
Anesthesia Post Note  Patient: [name] [name]  Procedure(s) Performed: right pectoralis muscle flap for sternal wound reconstruction and VAC placement (N/A Chest) STERNAL WOUND DEBRIDEMENT (N/A )     Patient location during evaluation: PACU Anesthesia Type: General Level of consciousness: awake and alert Pain management: pain level controlled Vital Signs Assessment: post-procedure vital signs reviewed and stable Respiratory status: spontaneous breathing, nonlabored ventilation, respiratory function stable and patient connected to nasal cannula oxygen Cardiovascular status: blood pressure returned to baseline and stable Postop Assessment: no apparent nausea or vomiting Anesthetic complications: no    Last Vitals:  Vitals:   04/04/19 1115 04/04/19 1200  BP: (!) 151/81 (!) 154/87  Pulse: 88 92  Resp: 11 16  Temp: (!) 36.1 C   SpO2: 97% 96%    Last Pain:  Vitals:   04/04/19 1200  TempSrc:   PainSc: [place]

## 2019-04-04 NOTE — Brief Op Note (Signed)
03/18/2019 - 04/04/2019  9:53 AM  PATIENT:  [name]  58 y.o. male  PRE-OPERATIVE DIAGNOSIS:  Sternal wound  POST-OPERATIVE DIAGNOSIS:  Sternal wound  PROCEDURE:  Procedure(s) with comments: right pectoralis muscle flap for sternal wound reconstruction and VAC placement (N/A) - 2.5 hours case length STERNAL WOUND DEBRIDEMENT (N/A)  SURGEON:  Surgeon(s) and Role: Panel 1:    * Dillingham, Loel Lofty, DO - Primary Panel 2:    * Ivin Poot, MD - Secondary  PHYSICIAN ASSISTANT:na  ASSISTANTS: [place] RNFA   ANESTHESIA:   general  EBL:  200 ml  BLOOD ADMINISTERED:none  DRAINS: 27 F JP drains x 2    LOCAL MEDICATIONS USED:  NONE  SPECIMEN:  Excision  DISPOSITION OF SPECIMEN:  culture of wound  COUNTS:  YES  TOURNIQUET:  * No tourniquets in log *  DICTATION: .  PLAN OF CARE: return to 2 H  PATIENT DISPOSITION:  PACU - hemodynamically stable.   Delay start of Pharmacological VTE agent (>24hrs) due to surgical blood loss or risk of bleeding: yes

## 2019-04-04 NOTE — Anesthesia Preprocedure Evaluation (Addendum)
Anesthesia Evaluation  Patient identified by MRN, date of birth, ID band Patient awake    Reviewed: Allergy & Precautions, H&P , NPO status , Patient's Chart, lab work & pertinent test results  Airway Mallampati: II   Neck ROM: full    Dental  (+) Edentulous Upper, Edentulous Lower, Dental Advisory Given   Pulmonary asthma , COPD, former smoker,    breath sounds clear to auscultation       Cardiovascular hypertension, + CAD, + Past MI, + Cardiac Stents and + Peripheral Vascular Disease   Rhythm:regular Rate:Normal  S/p repair type A ascending aortic dissection   Neuro/Psych CVA    GI/Hepatic GERD  ,  Endo/Other    Renal/GU stones     Musculoskeletal  (+) Arthritis ,   Abdominal   Peds  Hematology  (+) anemia ,   Anesthesia Other Findings   Reproductive/Obstetrics                            Anesthesia Physical Anesthesia Plan  ASA: III  Anesthesia Plan: General   Post-op Pain Management:    Induction: Intravenous  PONV Risk Score and Plan: 2 and Ondansetron, Dexamethasone, Midazolam and Treatment may vary due to age or medical condition  Airway Management Planned: Oral ETT  Additional Equipment: Arterial line, CVP and Ultrasound Guidance Line Placement  Intra-op Plan:   Post-operative Plan: Extubation in OR  Informed Consent: I have reviewed the patients History and Physical, chart, labs and discussed the procedure including the risks, benefits and alternatives for the proposed anesthesia with the patient or authorized representative who has indicated his/her understanding and acceptance.       Plan Discussed with: CRNA, Anesthesiologist and Surgeon  Anesthesia Plan Comments:        Anesthesia Quick Evaluation

## 2019-04-04 NOTE — Transfer of Care (Signed)
Immediate Anesthesia Transfer of Care Note  Patient: [name] [name]  Procedure(s) Performed: right pectoralis muscle flap for sternal wound reconstruction and VAC placement (N/A Chest) STERNAL WOUND DEBRIDEMENT (N/A )  Patient Location: PACU  Anesthesia Type:General  Level of Consciousness: awake, alert  and oriented  Airway & Oxygen Therapy: Patient Spontanous Breathing and Patient connected to face mask oxygen  Post-op Assessment: Report given to RN, Post -op Vital signs reviewed and stable and Patient moving all extremities X 4  Post vital signs: Reviewed and stable  Last Vitals:  Vitals Value Taken Time  BP 169/102 04/04/2019 10:17 AM  Temp    Pulse 92 04/04/2019 10:21 AM  Resp 21 04/04/2019 10:21 AM  SpO2 99 % 04/04/2019 10:21 AM  Vitals shown include unvalidated device data.  Last Pain:  Vitals:   04/04/19 0408  TempSrc:   PainSc: Asleep      Patients Stated Pain Goal: 2 (81/82/99 3716)  Complications: No apparent anesthesia complications

## 2019-04-04 NOTE — Progress Notes (Signed)
Pre Procedure note for inpatients:   [name] [name] has been scheduled for Procedure(s) with comments: pectoralis muscle flap for sternal wound reconstruction possible ACell and VAC placement (N/A) - 2.5 hours case length STERNAL WOUND DEBRIDEMENT (N/A) today. The various methods of treatment have been discussed with the patient. After consideration of the risks, benefits and treatment options the patient has consented to the planned procedure.   The patient has been seen and labs reviewed. There are no changes in the patient's condition to prevent proceeding with the planned procedure today.  Recent labs:  Lab Results  Component Value Date   WBC 9.7 04/04/2019   HGB 9.7 (L) 04/04/2019   HCT 32.5 (L) 04/04/2019   PLT 389 04/04/2019   GLUCOSE 106 (H) 04/04/2019   CHOL 156 02/23/2019   TRIG 101 02/23/2019   HDL 31 (L) 02/23/2019   LDLCALC 105 (H) 02/23/2019   ALT 20 03/31/2019   AST 21 03/31/2019   NA 136 04/04/2019   K 4.0 04/04/2019   CL 101 04/04/2019   CREATININE 0.87 04/04/2019   BUN 21 (H) 04/04/2019   CO2 27 04/04/2019   TSH 4.862 (H) 02/27/2019   INR 1.2 03/21/2019   HGBA1C 6.0 (H) 02/23/2019    Len Childs, MD 04/04/2019 7:18 AM

## 2019-04-04 NOTE — Anesthesia Procedure Notes (Signed)
Arterial Line Insertion Start/End4/05/2019 7:48 AM, 04/04/2019 7:54 AM Performed by: Wilburn Cornelia, CRNA, CRNA  Patient location: OR. Preanesthetic checklist: patient identified, IV checked, site marked, risks and benefits discussed, surgical consent, monitors and equipment checked, pre-op evaluation and anesthesia consent Patient sedated Left, radial was placed Catheter size: 20 G Hand hygiene performed  and maximum sterile barriers used  Allen's test indicative of satisfactory collateral circulation Attempts: 1 Procedure performed without using ultrasound guided technique. Ultrasound Notes:anatomy identified, needle tip was noted to be adjacent to the nerve/plexus identified and no ultrasound evidence of intravascular and/or intraneural injection Following insertion, dressing applied and Biopatch. Patient tolerated the procedure well with no immediate complications.

## 2019-04-04 NOTE — Anesthesia Procedure Notes (Signed)
Procedure Name: Intubation Date/Time: 04/04/2019 7:43 AM Performed by: Harden Mo, CRNA Pre-anesthesia Checklist: Patient identified, Emergency Drugs available, Suction available and Patient being monitored Patient Re-evaluated:Patient Re-evaluated prior to induction Oxygen Delivery Method: Circle System Utilized Preoxygenation: Pre-oxygenation with 100% oxygen Induction Type: IV induction and Rapid sequence Laryngoscope Size: [name] and 2 Grade View: Grade I Tube type: Oral Tube size: 7.5 mm Number of attempts: 1 Airway Equipment and Method: Stylet and Oral airway Placement Confirmation: ETT inserted through vocal cords under direct vision,  positive ETCO2 and breath sounds checked- equal and bilateral Secured at: 23 cm Tube secured with: Tape Dental Injury: Teeth and Oropharynx as per pre-operative assessment

## 2019-04-05 ENCOUNTER — Inpatient Hospital Stay (HOSPITAL_COMMUNITY): Payer: Medicare Other

## 2019-04-05 ENCOUNTER — Encounter (HOSPITAL_COMMUNITY): Payer: Self-pay | Admitting: Plastic Surgery

## 2019-04-05 LAB — BASIC METABOLIC PANEL
Anion gap: 7 (ref 5–15)
BUN: 18 mg/dL (ref 6–20)
CO2: 27 mmol/L (ref 22–32)
Calcium: 9.3 mg/dL (ref 8.9–10.3)
Chloride: 101 mmol/L (ref 98–111)
Creatinine, Ser: 0.76 mg/dL (ref 0.61–1.24)
GFR calc Af Amer: >60 mL/min (ref >60)
GFR calc non Af Amer: >60 mL/min (ref >60)
Glucose, Bld: 130 mg/dL — ABNORMAL HIGH (ref 70–99)
Potassium: 4.1 mmol/L (ref 3.5–5.1)
Sodium: 135 mmol/L (ref 135–145)

## 2019-04-05 LAB — POCT I-STAT 7, (LYTES, BLD GAS, ICA,H+H)
Acid-Base Excess: 8 mmol/L — ABNORMAL HIGH (ref 0.0–2.0)
Bicarbonate: 31.3 mmol/L — ABNORMAL HIGH (ref 20.0–28.0)
Calcium, Ion: 1.24 mmol/L (ref 1.15–1.40)
HCT: 25 % — ABNORMAL LOW (ref 39.0–52.0)
Hemoglobin: 8.5 g/dL — ABNORMAL LOW (ref 13.0–17.0)
O2 Saturation: 97 %
Patient temperature: 98.2
Potassium: 4.3 mmol/L (ref 3.5–5.1)
Sodium: 135 mmol/L (ref 135–145)
TCO2: 32 mmol/L (ref 22–32)
pCO2 arterial: 38.5 mmHg (ref 32.0–48.0)
pH, Arterial: 7.517 — ABNORMAL HIGH (ref 7.350–7.450)
pO2, Arterial: 78 mmHg — ABNORMAL LOW (ref 83.0–108.0)

## 2019-04-05 LAB — CBC
HCT: 27 % — ABNORMAL LOW (ref 39.0–52.0)
Hemoglobin: 8.3 g/dL — ABNORMAL LOW (ref 13.0–17.0)
MCH: 27.2 pg (ref 26.0–34.0)
MCHC: 30.7 g/dL (ref 30.0–36.0)
MCV: 88.5 fL (ref 80.0–100.0)
Platelets: 367 10*3/uL (ref 150–400)
RBC: 3.05 MIL/uL — ABNORMAL LOW (ref 4.22–5.81)
RDW: 15.2 % (ref 11.5–15.5)
WBC: 14.1 10*3/uL — ABNORMAL HIGH (ref 4.0–10.5)
nRBC: 0 % (ref 0.0–0.2)

## 2019-04-05 MED ORDER — SODIUM CHLORIDE 0.9% FLUSH
3.0000 mL | Freq: Two times a day (BID) | INTRAVENOUS | Status: DC
  Administered 2019-04-05 – 2019-04-06 (×3): 3 mL via INTRAVENOUS

## 2019-04-05 MED ORDER — SODIUM CHLORIDE 0.9% FLUSH: 3.0000 mL | INTRAVENOUS | Status: DC | PRN

## 2019-04-05 MED ORDER — SODIUM CHLORIDE 0.9 % IV SOLN: 250.0000 mL | INTRAVENOUS | Status: DC | PRN

## 2019-04-05 MED FILL — Sodium Chloride IV Soln 0.9%: INTRAVENOUS | Qty: 2000 | Status: AC

## 2019-04-05 MED FILL — Heparin Sodium (Porcine) Inj 1000 Unit/ML: INTRAMUSCULAR | Qty: 30 | Status: AC

## 2019-04-05 NOTE — Plan of Care (Signed)
  Problem: Health Behavior/Discharge Planning: Goal: Ability to manage health-related needs will improve Outcome: Progressing   Problem: Clinical Measurements: Goal: Ability to maintain clinical measurements within normal limits will improve Outcome: Progressing Goal: Will remain free from infection Outcome: Progressing Goal: Diagnostic test results will improve Outcome: Progressing Goal: Respiratory complications will improve Outcome: Progressing Goal: Cardiovascular complication will be avoided Outcome: Progressing   Problem: Activity: Goal: Risk for activity intolerance will decrease Outcome: Progressing   Problem: Nutrition: Goal: Adequate nutrition will be maintained Outcome: Progressing   Problem: Coping: Goal: Level of anxiety will decrease Outcome: Progressing   Problem: Pain Managment: Goal: General experience of comfort will improve Outcome: Progressing   Problem: Skin Integrity: Goal: Risk for impaired skin integrity will decrease Outcome: Progressing   Problem: Education: Goal: Understanding of cardiac disease, CV risk reduction, and recovery process will improve Outcome: Progressing Goal: Individualized Educational Video(s) Outcome: Progressing   Problem: Activity: Goal: Ability to tolerate increased activity will improve Outcome: Progressing   Problem: Cardiac: Goal: Ability to achieve and maintain adequate cardiovascular perfusion will improve Outcome: Progressing   Problem: Health Behavior/Discharge Planning: Goal: Ability to safely manage health-related needs after discharge will improve Outcome: Progressing   Problem: Education: Goal: Will demonstrate proper wound care and an understanding of methods to prevent future damage Outcome: Progressing Goal: Knowledge of disease or condition will improve Outcome: Progressing Goal: Knowledge of the prescribed therapeutic regimen will improve Outcome: Progressing Goal: Individualized Educational  Video(s) Outcome: Progressing   Problem: Activity: Goal: Risk for activity intolerance will decrease Outcome: Progressing   Problem: Cardiac: Goal: Will achieve and/or maintain hemodynamic stability Outcome: Progressing   Problem: Clinical Measurements: Goal: Postoperative complications will be avoided or minimized Outcome: Progressing   Problem: Respiratory: Goal: Respiratory status will improve Outcome: Progressing   Problem: Skin Integrity: Goal: Wound healing without signs and symptoms of infection Outcome: Progressing Goal: Risk for impaired skin integrity will decrease Outcome: Progressing   Problem: Urinary Elimination: Goal: Ability to achieve and maintain adequate renal perfusion and functioning will improve Outcome: Progressing

## 2019-04-05 NOTE — Progress Notes (Signed)
Nutrition Follow-up  DOCUMENTATION CODES:   Obesity unspecified  INTERVENTION:    Continue Juven Fruit Punch BID, each serving provides 95kcal and 2.5g of protein (amino acids glutamine and arginine)  Continue 30 ml Prostat BID, each supplement provides 100 kcals and 15 grams protein  Continue Magic cup TID with meals, each supplement provides 290 kcal and 9 grams of protein  NUTRITION DIAGNOSIS:   Increased nutrient needs related to wound healing as evidenced by estimated needs.  Ongoing  GOAL:   Patient will meet greater than or equal to 90% of their needs  Met PO  MONITOR:   PO intake, Supplement acceptance, Diet advancement, Skin, Labs, I & O's  REASON FOR ASSESSMENT:   Other (Comment)(documented wound)    ASSESSMENT:   58 yo male, admitted with chest pain, SOB. PMH significant for COPD, PAF, CAD, HTN, HLD, GERD, h/o colonic polyps, h/o MI, PVD, pre-diabetes, NASH. 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   3/20- admitted  3/24- I&D sternal wound, application wound vac 9/57- wound vac change 3/31- sternal wound debridement, wound vac change 4/2- I&D, application of ACell 4/6- pectoralis muscle turnover flap, debridement, vac change   RD working remotely.  Unable to reach pt by phone. Meal completions charted as 75-100% for pt's last eight meals. Pt looks to have refused Prostat this am but consumed Juven. Will continue with both and reassess need for Prostat at follow up. Unsure if pt is receiving Magic Cups will try to reach [facility].   Weight noted to decrease from 98.1 kg on 4/1 to 85 kg today. Pt continue to diurese.   Plan for transfer to SDU today.   I/O: -21,734 ml since 3/24 UOP: 3100 ml x 24 hrs JP drains: 217 ml x 24 hrs   Medications reviewed and include: dulcolax, iron-B12-VitC-folic acid, 40 mg lasix once daily, Mag ox, senokot,  aldactone Labs reviewed: CBG 95-130  Diet Order:   Diet Order            Diet heart healthy/carb modified Room service appropriate? Yes; Fluid consistency: Thin  Diet effective now              EDUCATION NEEDS:   No education needs have been identified at this time  Skin:  Skin Assessment: Skin Integrity Issues: Skin Integrity Issues:: Incisions, Wound VAC, Other (Comment) Wound Vac: sternal abcess Incisions: chest Other: ecchymosis - L chest, R leg; skin tear - R upper back  Last BM:  4/7  Height:   Ht Readings from Last 1 Encounters:  04/03/19 _0  (1.727 m)    Weight:   Wt Readings from Last 1 Encounters:  04/05/19 95 kg    Ideal Body Weight:  70 kg  BMI:  Body mass index is 31.84 kg/m.  Estimated Nutritional Needs:   Kcal:  2100-2450 (30-35 kcal/kg)  Protein:  105-126 (1.5-1.8 g/kg)  Fluid:  >/= 1.5 L daily or per MD   Mariana Single RD, LDN Clinical Nutrition Pager # [phone]

## 2019-04-05 NOTE — Progress Notes (Addendum)
1 Day Post-Op Procedure(s) (LRB): right pectoralis muscle flap for sternal wound reconstruction and VAC placement (N/A) STERNAL WOUND DEBRIDEMENT (N/A) Subjective: Awake and alert, sitting up in bedside chair. He has walked in the hall this AM. Having pain in chest that he says in not fully controlled.   Objective: Vital signs in last 24 hours: Temp:  [97 F (36.1 C)-98.6 F (37 C)] 98.2 F (36.8 C) (04/07 0430) Pulse Rate:  [61-96] 83 (04/07 0730) Cardiac Rhythm: Normal sinus rhythm (04/07 0400) Resp:  [9-23] 14 (04/07 0730) BP: (114-169)/(62-102) 154/79 (04/07 0730) SpO2:  [91 %-100 %] 95 % (04/07 0730) Weight:  [95 kg] 95 kg (04/07 0500)     Intake/Output from previous day: 04/06 0701 - 04/07 0700 In: 2370 [P.O.:720; I.V.:1400; IV Piggyback:250] Out: 9791 [Urine:3100; Drains:217; Blood:250] Intake/Output this shift: No intake/output data recorded.  General appearance: alert, cooperative and moderate distress Neurologic: intact Heart: regular rate and rhythm Lungs: Breath sounds are clear. Wound: negative pressure dressing in place over sternal wound. Minimal serosanguinous drainage. JP's reported to have drained ~71m overnight.  Lab Results: Recent Labs    04/04/19 0309 04/05/19 0407 04/05/19 0435  WBC 9.7 14.1*  --   HGB 9.7* 8.3* 8.5*  HCT 32.5* 27.0* 25.0*  PLT 389 367  --    BMET:  Recent Labs    04/04/19 0309 04/05/19 0407 04/05/19 0435  NA 136 135 135  K 4.0 4.1 4.3  CL 101 101  --   CO2 27 27  --   GLUCOSE 106* 130*  --   BUN 21* 18  --   CREATININE 0.87 0.76  --   CALCIUM 9.4 9.3  --     PT/INR: No results for input(s): LABPROT, INR in the last 72 hours. ABG    Component Value Date/Time   PHART 7.517 (H) 04/05/2019 0435   HCO3 31.3 (H) 04/05/2019 0435   TCO2 32 04/05/2019 0435   ACIDBASEDEF 3.0 (H) 02/24/2019 0155   O2SAT 97.0 04/05/2019 0435   CBG (last 3)  No results for input(s): GLUCAP in the last 72  hours.  Assessment/Plan: S/P Procedure(s) (LRB): right pectoralis muscle flap for sternal wound reconstruction and VAC placement (N/A) STERNAL WOUND DEBRIDEMENT (N/A)  -POD1 right pectoral muscle turnover flap reconstruction of open sternal wound.by Dr. DMarla Roeand Dr. VPrescott Gum VS and cardiac rhythm stable, mobility satisfactory. Will d/c arterial line and left neck line. Transfer to [facility].Continue ABX for now.  Vac and drain mgt per Dr. DMarla Roe   -Expected acute blood loss anemia- minimal ongoing losses. Continue iron supplement and monitor.   -Hypertension- BP control adequate. Continue current Rx.  -DVT PPX- continue TEDs and SCD's     LOS: 18 days    MAntony Odea4/06/2019  Tolerating chest wall pain JP drains with small serosanguinous drainage Postop anemia on po iron Transfer to 2 C Will DC home on Preveena , drains to be removed later in Plastic surg clinic DC PICC at DC and cont po doxycycline  7 days Prob home Thurs  patient examined and medical record reviewed,agree with above note. PTharon AquasTrigt III 04/05/2019

## 2019-04-06 ENCOUNTER — Ambulatory Visit: Payer: Medicare Other | Admitting: Cardiothoracic Surgery

## 2019-04-06 LAB — CBC
HCT: 25.3 % — ABNORMAL LOW (ref 39.0–52.0)
Hemoglobin: 7.8 g/dL — ABNORMAL LOW (ref 13.0–17.0)
MCH: 27.6 pg (ref 26.0–34.0)
MCHC: 30.8 g/dL (ref 30.0–36.0)
MCV: 89.4 fL (ref 80.0–100.0)
Platelets: 334 10*3/uL (ref 150–400)
RBC: 2.83 MIL/uL — ABNORMAL LOW (ref 4.22–5.81)
RDW: 15 % (ref 11.5–15.5)
WBC: 10.7 10*3/uL — ABNORMAL HIGH (ref 4.0–10.5)
nRBC: 0 % (ref 0.0–0.2)

## 2019-04-06 LAB — COMPREHENSIVE METABOLIC PANEL
ALT: 17 U/L (ref 0–44)
AST: 20 U/L (ref 15–41)
Albumin: 2.6 g/dL — ABNORMAL LOW (ref 3.5–5.0)
Alkaline Phosphatase: 97 U/L (ref 38–126)
Anion gap: 8 (ref 5–15)
BUN: 26 mg/dL — ABNORMAL HIGH (ref 6–20)
CO2: 28 mmol/L (ref 22–32)
Calcium: 9 mg/dL (ref 8.9–10.3)
Chloride: 98 mmol/L (ref 98–111)
Creatinine, Ser: 0.85 mg/dL (ref 0.61–1.24)
GFR calc Af Amer: >60 mL/min (ref >60)
GFR calc non Af Amer: >60 mL/min (ref >60)
Glucose, Bld: 102 mg/dL — ABNORMAL HIGH (ref 70–99)
Potassium: 3.9 mmol/L (ref 3.5–5.1)
Sodium: 134 mmol/L — ABNORMAL LOW (ref 135–145)
Total Bilirubin: 0.3 mg/dL (ref 0.3–1.2)
Total Protein: 5.9 g/dL — ABNORMAL LOW (ref 6.5–8.1)

## 2019-04-06 LAB — AEROBIC CULTURE W GRAM STAIN (SUPERFICIAL SPECIMEN)
Culture: NO GROWTH
Gram Stain: NONE SEEN

## 2019-04-06 MED ORDER — DOXYCYCLINE HYCLATE 100 MG PO TABS
100.0000 mg | ORAL_TABLET | Freq: Two times a day (BID) | ORAL | Status: DC
  Administered 2019-04-06 – 2019-04-07 (×3): 100 mg via ORAL
  Filled 2019-04-06 (×3): qty 1

## 2019-04-06 NOTE — Care Management Important Message (Signed)
Important Message  Patient Details  Name: [name] [name] MRN: [id number] Date of Birth: January 01, 1961   Medicare Important Message Given:  Yes    Orbie Pyo 04/06/2019, 2:12 PM

## 2019-04-06 NOTE — Progress Notes (Addendum)
2 Days Post-Op Procedure(s) (LRB): right pectoralis muscle flap for sternal wound reconstruction and VAC placement (N/A) STERNAL WOUND DEBRIDEMENT (N/A) Subjective: Up in the chair, pain control better. No new concerns. He is anxious to return to home.  Objective: Vital signs in last 24 hours: Temp:  [97.9 F (36.6 C)-98.7 F (37.1 C)] 98 F (36.7 C) (04/08 0300) Pulse Rate:  [68-95] 69 (04/08 0300) Cardiac Rhythm: Normal sinus rhythm (04/08 0419) Resp:  [11-18] 15 (04/08 0300) BP: (110-148)/(56-98) 148/68 (04/08 0300) SpO2:  [96 %-100 %] 98 % (04/08 0300) Weight:  [96.8 kg] 96.8 kg (04/08 0300)     Intake/Output from previous day: 04/07 0701 - 04/08 0700 In: 1688.8 [P.O.:720; I.V.:13; IV Piggyback:955.8] Out: 5449 [Urine:3425; Drains:160] Intake/Output this shift: No intake/output data recorded.  General appearance: alert, cooperative and moderate distress Neurologic: intact Heart: regular rate and rhythm Lungs: Breath sounds are clear. Wound: negative pressure dressing in place over sternal wound. Minimal serosanguinous drainage from vac.. JP's drained 158m past 24 hours.  Lab Results: Recent Labs    04/05/19 0407 04/05/19 0435 04/06/19 0442  WBC 14.1*  --  10.7*  HGB 8.3* 8.5* 7.8*  HCT 27.0* 25.0* 25.3*  PLT 367  --  334   BMET:  Recent Labs    04/05/19 0407 04/05/19 0435 04/06/19 0442  NA 135 135 134*  K 4.1 4.3 3.9  CL 101  --  98  CO2 27  --  28  GLUCOSE 130*  --  102*  BUN 18  --  26*  CREATININE 0.76  --  0.85  CALCIUM 9.3  --  9.0    PT/INR: No results for input(s): LABPROT, INR in the last 72 hours. ABG    Component Value Date/Time   PHART 7.517 (H) 04/05/2019 0435   HCO3 31.3 (H) 04/05/2019 0435   TCO2 32 04/05/2019 0435   ACIDBASEDEF 3.0 (H) 02/24/2019 0155   O2SAT 97.0 04/05/2019 0435   CBG (last 3)  No results for input(s): GLUCAP in the last 72 hours.  Assessment/Plan: S/P Procedure(s) (LRB): right pectoralis muscle flap for  sternal wound reconstruction and VAC placement (N/A) STERNAL WOUND DEBRIDEMENT (N/A)  -POD2 right pectoral muscle turnover flap reconstruction of open sternal wound.by Dr. DMarla Roeand Dr. VPrescott Gum VS and cardiac rhythm stable, pain controlled, Independent with mobility. Moderate drainage from JP's. Dr. DMarla Roeplans to remove drains in the office next week.  Plan to discontinue Vanc and IV Cipro today,  remove PICC.  Begin oral doxycycline 1053mpo BID for 7 days.   -Expected acute blood loss anemia- Hct reasonably stable. Continue iron supplement and monitor.   -Hypertension- BP control adequate. Continue current Rx.  -DVT PPX- continue TEDs and SCD's   LOS: 19 days    MyAntony OdeaPA-C [phone]/07/2019  The patient required IV Dilaudid midmorning for adequate pain relief. We will keep patient in hospital today and transition to only oral pain medication in preparation for discharge tomorrow  patient examined and medical record reviewed,agree with above note. PeTharon Aquasrigt III 04/06/2019

## 2019-04-06 NOTE — Progress Notes (Signed)
Subjective: The patient is up in chair eating breakfast.  Feels good.  Sore but pain improved.  Objective: Vital signs in last 24 hours: Temp:  [97.9 F (36.6 C)-98.7 F (37.1 C)] 98.1 F (36.7 C) (04/08 0759) Pulse Rate:  [68-95] 83 (04/08 0759) Resp:  [11-18] 14 (04/08 0759) BP: (110-148)/(56-98) 147/61 (04/08 0759) SpO2:  [96 %-100 %] 99 % (04/08 0759) Weight:  [96.8 kg] 96.8 kg (04/08 0300) Weight change: 1.8 kg Last BM Date: 04/05/19  Intake/Output from previous day: 04/07 0701 - 04/08 0700 In: 1688.8 [P.O.:720; I.V.:13; IV Piggyback:955.8] Out: 6433 [Urine:3425; Drains:160] Intake/Output this shift: Total I/O In: 240 [P.O.:240] Out: -   General appearance: alert, cooperative and no distress Incision/Wound: VAC in place and no periwound redness.  Drains working well.  Lab Results: Recent Labs    04/05/19 0407 04/05/19 0435 04/06/19 0442  WBC 14.1*  --  10.7*  HGB 8.3* 8.5* 7.8*  HCT 27.0* 25.0* 25.3*  PLT 367  --  334   BMET Recent Labs    04/05/19 0407 04/05/19 0435 04/06/19 0442  NA 135 135 134*  K 4.1 4.3 3.9  CL 101  --  98  CO2 27  --  28  GLUCOSE 130*  --  102*  BUN 18  --  26*  CREATININE 0.76  --  0.85  CALCIUM 9.3  --  9.0    Studies/Results: Dg Chest Port 1 View  Result Date: 04/05/2019 CLINICAL DATA:  Chest soreness. Patient status post sternal wound debridement 04/04/2019. EXAM: PORTABLE CHEST 1 VIEW COMPARISON:  Single-view of the chest 04/04/2019 and 03/30/2019. FINDINGS: Right PICC and left IJ catheter are unchanged the lungs are clear. No pneumothorax or pleural fluid. Heart size is upper normal. No acute bony abnormality. IMPRESSION: No acute disease or change from yesterday's examination. Electronically Signed   By: Inge Rise M.D.   On: 04/05/2019 08:12   Dg Chest Port 1 View  Result Date: 04/04/2019 CLINICAL DATA:  Status post central line placement today. EXAM: PORTABLE CHEST 1 VIEW COMPARISON:  Single-view of the chest  03/30/2019. FINDINGS: Left IJ central venous catheter is in place with the tip at the junction of the left internal jugular and left subclavian veins. Right PICC is unchanged. Lungs are clear. Heart size is upper normal. No pneumothorax or pleural effusion. IMPRESSION: Left IJ catheter tip is at the junction of the left internal jugular and left subclavian veins. Negative for pneumothorax. These results will be called to the ordering clinician or representative by the Radiologist Assistant, and communication documented in the PACS or zVision Dashboard. Electronically Signed   By: Inge Rise M.D.   On: 04/04/2019 11:13    Medications: I have reviewed the patient's current medications.  Assessment/Plan: Sternal wound:  Ready for home per CT surgery.  Follow up in one week.  No heavy lifting.  Baby wipes for bath.  Continue VAC till follow up.  LOS: 19 days   Wallace Going 04/06/2019, 8:27 AM

## 2019-04-06 NOTE — Plan of Care (Signed)
  Problem: Health Behavior/Discharge Planning: Goal: Ability to manage health-related needs will improve Outcome: Progressing   Problem: Clinical Measurements: Goal: Ability to maintain clinical measurements within normal limits will improve Outcome: Progressing Goal: Will remain free from infection Outcome: Progressing Goal: Diagnostic test results will improve Outcome: Progressing Goal: Respiratory complications will improve Outcome: Progressing Goal: Cardiovascular complication will be avoided Outcome: Progressing   Problem: Activity: Goal: Risk for activity intolerance will decrease Outcome: Progressing   Problem: Nutrition: Goal: Adequate nutrition will be maintained Outcome: Progressing   Problem: Coping: Goal: Level of anxiety will decrease Outcome: Progressing   Problem: Pain Managment: Goal: General experience of comfort will improve Outcome: Progressing   Problem: Skin Integrity: Goal: Risk for impaired skin integrity will decrease Outcome: Progressing   Problem: Education: Goal: Understanding of cardiac disease, CV risk reduction, and recovery process will improve Outcome: Progressing Goal: Individualized Educational Video(s) Outcome: Progressing   Problem: Activity: Goal: Ability to tolerate increased activity will improve Outcome: Progressing   Problem: Cardiac: Goal: Ability to achieve and maintain adequate cardiovascular perfusion will improve Outcome: Progressing   Problem: Health Behavior/Discharge Planning: Goal: Ability to safely manage health-related needs after discharge will improve Outcome: Progressing   Problem: Education: Goal: Will demonstrate proper wound care and an understanding of methods to prevent future damage Outcome: Progressing Goal: Knowledge of disease or condition will improve Outcome: Progressing Goal: Knowledge of the prescribed therapeutic regimen will improve Outcome: Progressing Goal: Individualized Educational  Video(s) Outcome: Progressing   Problem: Activity: Goal: Risk for activity intolerance will decrease Outcome: Progressing   Problem: Cardiac: Goal: Will achieve and/or maintain hemodynamic stability Outcome: Progressing   Problem: Clinical Measurements: Goal: Postoperative complications will be avoided or minimized Outcome: Progressing   Problem: Respiratory: Goal: Respiratory status will improve Outcome: Progressing   Problem: Skin Integrity: Goal: Wound healing without signs and symptoms of infection Outcome: Progressing Goal: Risk for impaired skin integrity will decrease Outcome: Progressing   Problem: Urinary Elimination: Goal: Ability to achieve and maintain adequate renal perfusion and functioning will improve Outcome: Progressing

## 2019-04-07 LAB — TYPE AND SCREEN
ABO/RH(D): O POS
Antibody Screen: NEGATIVE
Unit division: 0
Unit division: 0

## 2019-04-07 LAB — CBC
HCT: 27.7 % — ABNORMAL LOW (ref 39.0–52.0)
Hemoglobin: 8.6 g/dL — ABNORMAL LOW (ref 13.0–17.0)
MCH: 27.5 pg (ref 26.0–34.0)
MCHC: 31 g/dL (ref 30.0–36.0)
MCV: 88.5 fL (ref 80.0–100.0)
Platelets: 381 10*3/uL (ref 150–400)
RBC: 3.13 MIL/uL — ABNORMAL LOW (ref 4.22–5.81)
RDW: 15.2 % (ref 11.5–15.5)
WBC: 10 10*3/uL (ref 4.0–10.5)
nRBC: 0.2 % (ref 0.0–0.2)

## 2019-04-07 LAB — BPAM RBC
Blood Product Expiration Date: 202004282359
Blood Product Expiration Date: 202004282359
ISSUE DATE / TIME: 202004060742
Unit Type and Rh: 5100
Unit Type and Rh: 5100

## 2019-04-07 MED ORDER — TRAMADOL HCL 50 MG PO TABS: 50.0000 mg | ORAL_TABLET | Freq: Four times a day (QID) | ORAL | 0 refills | Status: DC | PRN

## 2019-04-07 MED ORDER — SPIRONOLACTONE 25 MG PO TABS: 12.5000 mg | ORAL_TABLET | Freq: Every day | ORAL | 2 refills | Status: AC

## 2019-04-07 MED ORDER — SPIRONOLACTONE 25 MG PO TABS: 12.5000 mg | ORAL_TABLET | Freq: Every day | ORAL | 2 refills | Status: DC

## 2019-04-07 MED ORDER — FERROUS SULFATE 325 (65 FE) MG PO TBEC: 325.0000 mg | DELAYED_RELEASE_TABLET | Freq: Two times a day (BID) | ORAL | 0 refills | Status: DC

## 2019-04-07 MED ORDER — ASPIRIN 81 MG PO TBEC: 81.0000 mg | DELAYED_RELEASE_TABLET | Freq: Every day | ORAL | Status: AC

## 2019-04-07 MED ORDER — CARVEDILOL 12.5 MG PO TABS: 12.5000 mg | ORAL_TABLET | Freq: Two times a day (BID) | ORAL | 2 refills | Status: DC

## 2019-04-07 MED ORDER — OXYCODONE-ACETAMINOPHEN 5-325 MG PO TABS: 1.0000 | ORAL_TABLET | ORAL | 0 refills | Status: AC | PRN

## 2019-04-07 MED ORDER — DOXYCYCLINE HYCLATE 100 MG PO TABS: 100.0000 mg | ORAL_TABLET | Freq: Two times a day (BID) | ORAL | 0 refills | Status: DC

## 2019-04-07 MED ORDER — FUROSEMIDE 40 MG PO TABS: 40.0000 mg | ORAL_TABLET | Freq: Every day | ORAL | 2 refills | Status: DC

## 2019-04-07 NOTE — Progress Notes (Addendum)
3 Days Post-Op Procedure(s) (LRB): right pectoralis muscle flap for sternal wound reconstruction and VAC placement (N/A) STERNAL WOUND DEBRIDEMENT (N/A) Subjective: No new concerns. Pain managed with oral Percocet and tramadol since midday yesterday.  Anxious to go home.  Objective: Vital signs in last 24 hours: Temp:  [97.8 F (36.6 C)-99.3 F (37.4 C)] 98.6 F (37 C) (04/09 0459) Pulse Rate:  [74-86] 77 (04/09 0459) Cardiac Rhythm: Normal sinus rhythm;Bundle branch block (04/09 0700) Resp:  [13-18] 13 (04/09 0023) BP: (122-147)/(60-76) 133/72 (04/09 0023) SpO2:  [96 %-99 %] 97 % (04/09 0023) Weight:  [96.4 kg] 96.4 kg (04/09 0240)   Intake/Output from previous day: 04/08 0701 - 04/09 0700 In: 720 [P.O.:720] Out: 2195 [Urine:2075; Drains:120] Intake/Output this shift: No intake/output data recorded.  General appearance:alert, cooperative and no distress Neurologic:intact Heart:regular rate and rhythm Lungs:Breath sounds are clear. Wound:negative pressure dressing in place over sternal wound. Minimal serosanguinous drainage from vac.. JP's drained 112m past 24 hours.  Lab Results: Recent Labs    04/06/19 0442 04/07/19 0318  WBC 10.7* 10.0  HGB 7.8* 8.6*  HCT 25.3* 27.7*  PLT 334 381   BMET:  Recent Labs    04/05/19 0407 04/05/19 0435 04/06/19 0442  NA 135 135 134*  K 4.1 4.3 3.9  CL 101  --  98  CO2 27  --  28  GLUCOSE 130*  --  102*  BUN 18  --  26*  CREATININE 0.76  --  0.85  CALCIUM 9.3  --  9.0    PT/INR: No results for input(s): LABPROT, INR in the last 72 hours. ABG    Component Value Date/Time   PHART 7.517 (H) 04/05/2019 0435   HCO3 31.3 (H) 04/05/2019 0435   TCO2 32 04/05/2019 0435   ACIDBASEDEF 3.0 (H) 02/24/2019 0155   O2SAT 97.0 04/05/2019 0435   CBG (last 3)  No results for input(s): GLUCAP in the last 72 hours.  Assessment/Plan: S/P Procedure(s) (LRB): right pectoralis muscle flap for sternal wound reconstruction and VAC  placement (N/A) STERNAL WOUND DEBRIDEMENT (N/A)  -POD3 right pectoral muscle turnover flap reconstruction of open sternal wound.by Dr. DMarla Roeand Dr. VPrescott Gum VS and cardiac rhythm stable, pain controlled, Independent with mobility. Moderate drainage from JP's.  Plan discharge today with Prevena negative pressure dressing and JP's in place. . To follow up with Dr. DMarla Roenext week. Continue oral doxycycline 1059mpo BID for 6 more days.   -Expected acute blood loss anemia- Hct has been stable.No new lab today. Continue iron supplement at discharge  -Hypertension- BP control adequate. Continue current Rx.   LOS: 20 days    MyAntony Odea [phone] 04/07/2019  patient examined and medical record reviewed,agree with above note. Discharge instructions reviewed with patient regarding wound care, sternal limitations, medications, diet, and follow-up appointment. PeTharon Aquasrigt III 04/07/2019

## 2019-04-07 NOTE — Discharge Instructions (Signed)
° °-  The Prevena negative pressure dressing will remain in place over the chest incision until removed by Dr. Marla Roe in her office.   -Record and empty the JP bulb drainage daily and re-set the suction bulb as instructed.  -No driving until your follow up with Dr. Darcey Nora and until you are not requiring narcotic pain medication.  -No lifting greater than 10 lbs.  You are encouraged to walk several times daily.  -Bathe with baby wipes until instructed otherwise by Dr. Marla Roe  -Resume a low-fat heart-healthy diet as previously instructed.

## 2019-04-07 NOTE — Progress Notes (Signed)
Patient is ready for discharge. His PICC line has been removed. He has been taken off of telemetry and CCMD has been notified. I have reviewed all discharge instructions with patient and answered all questions regarding his discharge. Patient will be transported home by his wife. She will call when she gets to the front entrance of the hospital and patient will be taken to the front entrance by staff. Patient will leave hospital with Bilateral JP drains and he has been educated on the importance of measuring fluid once he empties it. He will also leave with a [employer] wound pump. I have hooked pump up for patient and educated him on how to charge the unit.

## 2019-04-07 NOTE — TOC Transition Note (Addendum)
Transition of Care [facility]) - CM/SW Discharge Note   Patient Details  Name: [name] [name] MRN: [id number] Date of Birth: 1961/05/12  [facility] CM/SW Contact:  Maryclare Labrador, RN Phone Number: 04/07/2019, 11:25 AM   Clinical Narrative:  Pt to discharge home today.  Pt will will discharge home with praveena dressing with suction provided by [employer] wound vac.  Wound vac at bedside and nurse will apply prior to discharge.  Praveena wound vac will be managed totally by CVTS service.  Pt still in agreement with [facility] for [facility] - agency informed of discharge home today.  Pt will discharge home on PO antibtiotics    Final next level of care: [place] Barriers to Discharge: Barriers Resolved   Patient Goals and CMS Choice Patient states their goals for this hospitalization and ongoing recovery are:: (To get back home to his wife and not get sick with the virus) CMS Medicare.gov Compare Post Acute Care list provided to:: Patient Choice offered to / list presented to : Patient  Discharge Placement                       Discharge Plan and Services   Discharge Planning Services: CM Consult            HH Arranged: RN [facility] Agency: [place] [facility]   Social Determinants of Health (SDOH) Interventions     Readmission Risk Interventions No flowsheet data found.

## 2019-04-08 DIAGNOSIS — Z951 Presence of aortocoronary bypass graft: Secondary | ICD-10-CM | POA: Diagnosis not present

## 2019-04-08 IMAGING — DX PORTABLE CHEST - 1 VIEW
1 series · 1 of 1 positions shown · non-contrast
Comparison: Single-view of the chest 04/04/2019 and 03/30/2019.

CLINICAL DATA: Chest soreness. Patient status post sternal wound
debridement 04/04/2019.

EXAM:
PORTABLE CHEST 1 VIEW

[chest ap]
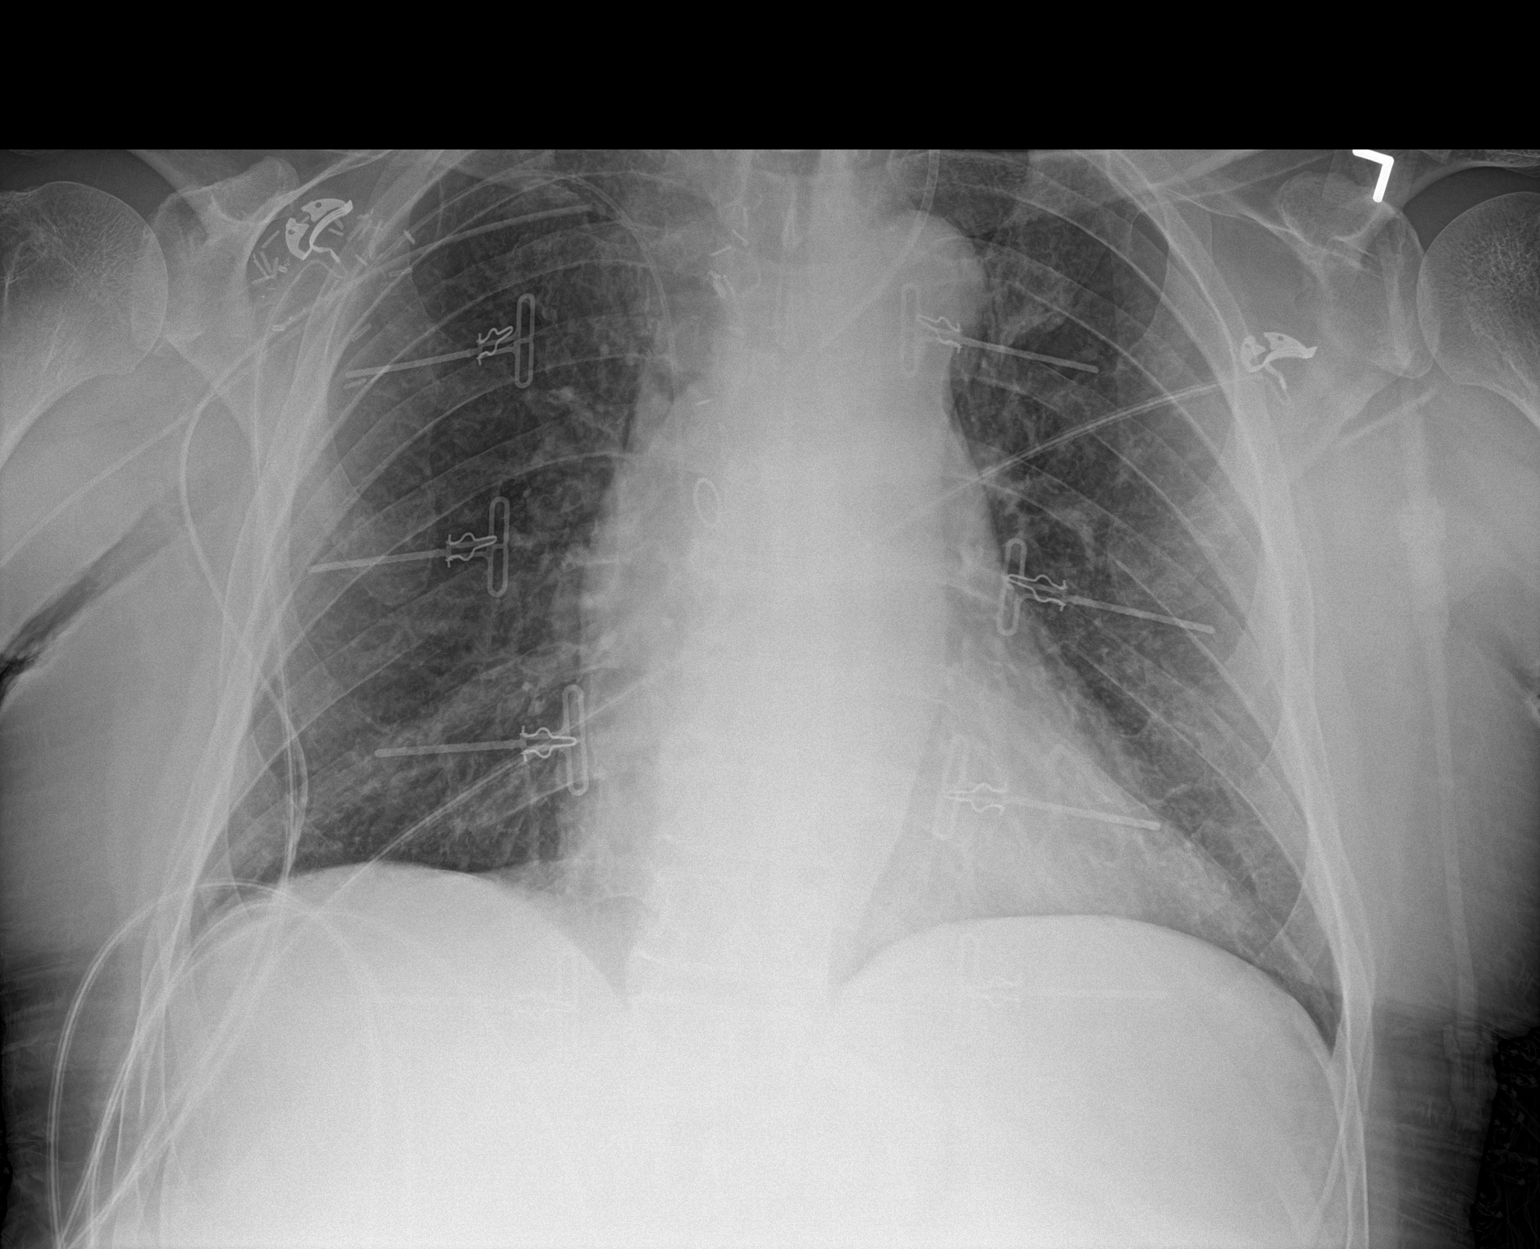

[1 of 1 positions shown; findings below may reference images not displayed]

FINDINGS: Right PICC and left IJ catheter are unchanged the lungs are clear.
No pneumothorax or pleural fluid. Heart size is upper normal. No
acute bony abnormality.
IMPRESSION: No acute disease or change from yesterday's examination.

## 2019-04-09 LAB — ANAEROBIC CULTURE

## 2019-04-11 ENCOUNTER — Telehealth: Payer: Self-pay | Admitting: Plastic Surgery

## 2019-04-11 NOTE — Telephone Encounter (Signed)
Received call from [place], [place] from [place]. She stated that the wound vac has not had any output since Thursday, and between the two JP drains he has had 120 cc on average daily.  Contact number [phone] ext [phone]

## 2019-04-11 NOTE — Telephone Encounter (Signed)
Rosary Lively, RN with [facility] and [facility] @ (10:41am) @ [phone]) asking her to RTC regarding the message below.//AB/CMA

## 2019-04-11 NOTE — Telephone Encounter (Signed)
Received a call back from [place], [place] with [facility] regarding the message below.  She stated that she just wanted to let Dr. Marla Roe know that she saw the patient on (04/08/19).  The wound vac canister was empty with no drainage output. The JP drains had 120 cc on average daily.  She saw the patient again on yesterday and still and there was no drainage in the canister, dressing on and looks good.  JP drains are putting 120cc between the 2 daily.  Vac is working at 125 no leaking.  Christy,RN wanted to know if there is any concerns and is there anything that needs to be done.//AB/CMA

## 2019-04-11 NOTE — Telephone Encounter (Signed)
Rosary Lively, RN with [facility] and [facility] @ (2:27pm) @ [phone]) asking her to RTC regarding the message below.//AB/CMA

## 2019-04-11 NOTE — Telephone Encounter (Signed)
Rosary Lively, RN with [facility] and [facility] @ (10:41am) @ [phone]) asking her to RTC regarding the message below.//AB/CMA

## 2019-04-12 NOTE — Telephone Encounter (Signed)
Christy from [facility] returned your call and left a voicemail. She said if there are new orders, they can be taken verbally by her manager at [phone] ext [phone]. If you have a question for her, she can be reached at [phone].

## 2019-04-12 NOTE — Telephone Encounter (Signed)
[place] and spoke with Anderson Malta the Orthoptist) regarding the note below.  I informed her that Dr. Marla Roe stated that it is normal that the canister was empty because of where the site was.  I also informed her that I do have new order for the patient.  Per Dr. Lorella Nimrod vac off and do dry dressing.  Anderson Malta verbalized understanding and agreed.//AB/CMA

## 2019-04-13 ENCOUNTER — Telehealth: Payer: Self-pay | Admitting: Plastic Surgery

## 2019-04-13 NOTE — Telephone Encounter (Signed)
Called patient to confirm appointment scheduled for tomorrow. Patient answered the following questions: 1.Has the patient traveled outside of the state of [place] at all within the past 6 weeks? No 2.Does the patient have a fever or cough at all? No 3.Has the patient been tested for COVID? Had a positive COVID test? No 4. Has the patient been in contact with anyone who has tested positive? No

## 2019-04-14 ENCOUNTER — Encounter: Payer: Self-pay | Admitting: Plastic Surgery

## 2019-04-14 ENCOUNTER — Ambulatory Visit (INDEPENDENT_AMBULATORY_CARE_PROVIDER_SITE_OTHER): Payer: Medicare Other | Admitting: Plastic Surgery

## 2019-04-14 ENCOUNTER — Other Ambulatory Visit: Payer: Self-pay

## 2019-04-14 DIAGNOSIS — T8132XA Disruption of internal operation (surgical) wound, not elsewhere classified, initial encounter: Secondary | ICD-10-CM | POA: Insufficient documentation

## 2019-04-14 NOTE — Progress Notes (Signed)
The patient is a 58 year old male here for follow-up after a pectoralis major muscle flap.  He had a sternal wound dehiscence.  He is doing extremely well the wound is completely intact.  The drain output has been minimal.  The right drain has had less out than the left.  The left drain was removed today.  He should continue with the breast binder.  Baths from the sink.  And follow-up in 1 week.

## 2019-04-18 ENCOUNTER — Telehealth: Payer: Self-pay | Admitting: Student

## 2019-04-18 NOTE — Telephone Encounter (Signed)
° ° °  Virtual Visit Pre-Appointment Phone Call  Steps For Call:  1. Confirm consent - "In the setting of the current Covid19 crisis, you are scheduled for a (phone or video) visit with your provider on (date) at (time).  Just as we do with many in-office visits, in order for you to participate in this visit, we must obtain consent.  If you'd like, I can send this to your mychart (if signed up) or email for you to review.  Otherwise, I can obtain your verbal consent now.  All virtual visits are billed to your insurance company just like a normal visit would be.  By agreeing to a virtual visit, we'd like you to understand that the technology does not allow for your provider to perform an examination, and thus may limit your provider's ability to fully assess your condition. If your provider identifies any concerns that need to be evaluated in person, we will make arrangements to do so.  Finally, though the technology is pretty good, we cannot assure that it will always work on either your or our end, and in the setting of a video visit, we may have to convert it to a phone-only visit.  In either situation, we cannot ensure that we have a secure connection.  Are you willing to proceed?" STAFF: Did the patient verbally acknowledge consent to telehealth visit? Document YES/NO here: Yes  2. Confirm the BEST phone number to call the day of the visit by including in appointment notes  3. Give patient instructions for WebEx/MyChart download to smartphone as below or [email or web address] if video visit (depending on what platform provider is using)  4. Advise patient to be prepared with their blood pressure, heart rate, weight, any heart rhythm information, their current medicines, and a piece of paper and pen handy for any instructions they may receive the day of their visit  5. Inform patient they will receive a phone call 15 minutes prior to their appointment time (may be from unknown caller ID) so they should be  prepared to answer  6. Confirm that appointment type is correct in Epic appointment notes (VIDEO vs PHONE)     TELEPHONE CALL NOTE  [name] has been deemed a candidate for a follow-up tele-health visit to limit community exposure during the Covid-19 pandemic. I spoke with the patient via phone to ensure availability of phone/video source, confirm preferred email & phone number, and discuss instructions and expectations.  I reminded [name] to be prepared with any vital sign and/or heart rhythm information that could potentially be obtained via home monitoring, at the time of his visit. I reminded [name] to expect a phone call at the time of his visit if his visit.  [name] 04/18/2019 12:57 PM

## 2019-04-19 ENCOUNTER — Other Ambulatory Visit: Payer: Self-pay

## 2019-04-19 ENCOUNTER — Telehealth (INDEPENDENT_AMBULATORY_CARE_PROVIDER_SITE_OTHER): Payer: Medicare Other | Admitting: Student

## 2019-04-19 ENCOUNTER — Ambulatory Visit (INDEPENDENT_AMBULATORY_CARE_PROVIDER_SITE_OTHER): Payer: Medicare Other | Admitting: Plastic Surgery

## 2019-04-19 ENCOUNTER — Encounter: Payer: Self-pay | Admitting: Student

## 2019-04-19 ENCOUNTER — Encounter: Payer: Self-pay | Admitting: Plastic Surgery

## 2019-04-19 VITALS — BP 148/72 | HR 105 | Temp 99.2°F | Ht 68.0 in | Wt 210.0 lb

## 2019-04-19 VITALS — BP 122/82 | Wt 210.0 lb

## 2019-04-19 DIAGNOSIS — I2510 Atherosclerotic heart disease of native coronary artery without angina pectoris: Secondary | ICD-10-CM | POA: Diagnosis not present

## 2019-04-19 DIAGNOSIS — Z7189 Other specified counseling: Secondary | ICD-10-CM

## 2019-04-19 DIAGNOSIS — I7101 Dissection of thoracic aorta: Secondary | ICD-10-CM

## 2019-04-19 DIAGNOSIS — T8132XA Disruption of internal operation (surgical) wound, not elsewhere classified, initial encounter: Secondary | ICD-10-CM

## 2019-04-19 DIAGNOSIS — Z951 Presence of aortocoronary bypass graft: Secondary | ICD-10-CM

## 2019-04-19 DIAGNOSIS — I71019 Dissection of thoracic aorta, unspecified: Secondary | ICD-10-CM

## 2019-04-19 DIAGNOSIS — E785 Hyperlipidemia, unspecified: Secondary | ICD-10-CM

## 2019-04-19 DIAGNOSIS — I10 Essential (primary) hypertension: Secondary | ICD-10-CM

## 2019-04-19 NOTE — Progress Notes (Signed)
   Subjective:    Patient ID: PHU RECORD, male    DOB: Jan 03, 1961, 58 y.o.   MRN: [id number]  The patient underwent a sternal wound repair with a pectoralis muscle flap.  He is doing very well.  The incision is healing nicely.  The drain was working well with minimal drainage.  The patient stated he accidentally pulled it today and was concerned so came in today.   Review of Systems  Constitutional: Positive for activity change. Negative for appetite change.  Respiratory: Negative for chest tightness and shortness of breath.   Gastrointestinal: Negative for abdominal pain.  Skin: Negative for color change and wound.  Psychiatric/Behavioral: Negative.        Objective:   Physical Exam Vitals signs and nursing note reviewed.  Constitutional:      Appearance: Normal appearance.  HENT:     Head: Normocephalic.  Pulmonary:     Effort: Pulmonary effort is normal. No respiratory distress.  Skin:    General: Skin is warm.  Neurological:     General: No focal deficit present.     Mental Status: He is alert.  Psychiatric:        Mood and Affect: Mood normal.        Behavior: Behavior normal.       Assessment & Plan:  Sternal wound dehiscence, initial encounter  Drain removed.  May have some drainage.  Can shower tomorrow. Follow up in one week and one month.

## 2019-04-19 NOTE — Progress Notes (Signed)
Virtual Visit via Telephone Note   This visit type was conducted due to national recommendations for restrictions regarding the COVID-19 Pandemic (e.g. social distancing) in an effort to limit this patient's exposure and mitigate transmission in our community.  Due to his co-morbid illnesses, this patient is at least at moderate risk for complications without adequate follow up.  This format is felt to be most appropriate for this patient at this time.  The patient did not have access to video technology/had technical difficulties with video requiring transitioning to audio format only (telephone).  All issues noted in this document were discussed and addressed.  No physical exam could be performed with this format.  Please refer to the patient's chart for his  consent to telehealth for [facility].   Evaluation Performed:  Follow-up visit  Date:  04/19/2019   ID:  [name] [name], [name] [name] January 31, 1961, MRN [id number]  Patient Location: Home Provider Location: Home  PCP:  Shirline Frees, MD  Cardiologist:  Rozann Lesches, MD  Electrophysiologist:  None   Chief Complaint: Hospital Follow-Up  History of Present Illness:    [name] [name] is a 58 y.o. male with past medical history of CAD (cath in 2010 showing 20% distal LM, 70-80% Ost LAD, 30% OM, and 40% RCA, low-risk NST in 2014 and 2016), HTN, HLD, COPD, and former tobacco use who presents for a Telemedicine Visit in regards to hospital follow-up.   He recently presented to [facility] on 02/22/2019 for evaluation of chest pain and while cyclic enzymes were negative, his symptoms were concerning for unstable angina as he reported progressive chest pain with exertion for the past 3+ months. He was transferred and underwent a cardiac catheterization on 2/26 which showed single vessel obstructive CAD with 90% Prox-RCA stenosis which was treated with DESx2. Did have nonobstructive CAD along the distal LM, Ost LAD, Proximal LAD, and  Proximal LCx. At the end of the procedure, he was noted to have an aortic root dissection felt to be related to guide catheter trauma, therefore an emergent Aortic CTA was obtained. This confirmed a Type A thoracic aortic dissection. He was taken to the OR by Dr. Prescott Gum and underwent repair of acute type A ascending thoracic aortic dissection with a 26 mm Hemashield woven graft replacement of the ascending aorta and hemi-arch and CABG x1 with SVG-RCA. He did develop post-operative atrial fibrillation and converted back to NSR with Amiodarone. Was discharged on 03/05/2019 with ASA 32m daily, Amiodarone 4066mBID for 7 days, then 200102mID for 7 days, then 200m7mily afterwards, Amlodipine, Atorvastatin, Coreg, and Losartan.   He presented back to the ED on 03/18/2019 for recurrent chest pain and was found to have a mild to moderate pericardial effusion by CT with echo showing a small effusion but concern for early tamponade. He was started on Colchicine and IV Antibiotics until cultures resulted. Later during admission, was noted to have purulent drainage along his sternal wound and went back to the OR on 3/24 for wound debridement and application of a wound vac. Returned to the OR on 3/27, 3/31, 4/2, and 4/6 for recurrent debridement and wound vac changes. Wound cultures remained negative. On 4/6, plastic surgery assisted with reconstruction of the open wound using the right pectoralis major turnover flap. Amiodarone was discontinued at discharge and ASA was reduced from 325mg72mly to 81mg 82my. Was discharged home on 04/07/2019.  In talking with the patient today, he reports overall progressing well since hospital discharge.  He continues to have sternal discomfort which is typically worse in the evening hours but states this continues to improve. Not worse with exertion. Denies any dyspnea on exertion, orthopnea, PND or edema.   He was evaluated by Plastic Surgery earlier today as he had accidentally  pulled on one of his drains and this was removed at his appointment. Says his surgeon was overall pleased with his progress and recovery thus far.   The patient does not have symptoms concerning for COVID-19 infection (fever, chills, cough, or new shortness of breath).    Past Medical History:  Diagnosis Date   Adenomatous colon polyp 2009   Alcohol abuse    abstinent since 1992   Arthritis    back   Asthma    Barrett's esophagus 05/22/2015   Chronic back pain    COPD (chronic obstructive pulmonary disease) ([place])    Coronary artery disease    a. cath in 2010 showing 20% distal LM, 70-80% Ost LAD, 30% OM, and 40% RCA b. low-risk NST in 2014 and 2016 c. 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 Underwent repair of dissection and SVG-RCA.    GERD (gastroesophageal reflux disease)    takes Nexium daily   Gout    takes ALlopurinol daily   Hemorrhoids    History of bronchitis 2015   History of kidney stones    Hyperlipidemia    was given a script a month ago but scared to take it.Medical Md is aware   Hypertension    takes Losartan,Metoprolol,and Amlodipine daily   Joint pain    Lymphocytic colitis 2009   Myocardial infarction [facility]) 2010   "light" (admit for CP 11/2009; ruled out for MI but had 70-80% ostail LAD stenosis and treated medically following NL perfusion study)   NASH (nonalcoholic steatohepatitis)    Peripheral vascular disease ([place])    Personal history of colonic polyps 07/20/2002   Diminutive adenomas (3) 06/2002 diminutive adenoma (1) 2011    Pre-diabetes    Stroke [facility])    TIA (transient ischemic attack)    Urinary urgency    Past Surgical History:  Procedure Laterality Date   25 GAUGE PARS PLANA VITRECTOMY WITH 20 GAUGE MVR PORT N/A 03/25/2019   Procedure: WOUND VAC CHANGE AND IRRIGATION;  Surgeon: Ivin Poot, MD;  Location: [place];   Service: Thoracic;  Laterality: N/A;   ANEURYSM COILING  12/2010   cerebral   APPLICATION OF WOUND VAC N/A 03/22/2019   Procedure: APPLICATION OF WOUND VAC;  Surgeon: Ivin Poot, MD;  Location: [place];  Service: Thoracic;  Laterality: N/A;   APPLICATION OF WOUND VAC N/A 03/29/2019   Procedure: WOUND VAC CHANGE;  Surgeon: Ivin Poot, MD;  Location: [place];  Service: Thoracic;  Laterality: N/A;   APPLICATION OF WOUND VAC N/A 03/31/2019   Procedure: WOUND VAC CHANGE;  Surgeon: Ivin Poot, MD;  Location: [place];  Service: Thoracic;  Laterality: N/A;   BAND HEMORRHOIDECTOMY  2003   at sigmoidoscopy   CARDIAC CATHETERIZATION  2010   CHOLECYSTECTOMY  08/03/2015   CHOLECYSTECTOMY N/A 08/03/2015   Procedure: LAPAROSCOPIC CHOLECYSTECTOMY WITH INTRAOPERATIVE CHOLANGIOGRAM;  Surgeon: Fanny Skates, MD;  Location: [place];  Service: General;  Laterality: N/A;   COLONOSCOPY  multiple   CORONARY ARTERY BYPASS GRAFT N/A 02/23/2019   Procedure: CORONARY ARTERY BYPASS GRAFTING (CABG) x 1; Using Endoscopically Harvested Right Leg Greater Saphenous  Vein Graft (SVG); SVG to RCA;  Surgeon: Ivin Poot, MD;  Location: [place];  Service: Open Heart Surgery;  Laterality: N/A;   CORONARY STENT INTERVENTION N/A 02/23/2019   Procedure: CORONARY STENT INTERVENTION;  Surgeon: Martinique, Peter M, MD;  Location: [place] CV [facility];  Service: Cardiovascular;  Laterality: N/A;   ESOPHAGOGASTRODUODENOSCOPY     KNEE ARTHROSCOPY WITH MEDIAL MENISECTOMY Left 05/20/2017   Procedure: LEFT KNEE ARTHROSCOPY WITH PARTIAL MEDIAL MENISCECTOMY;  Surgeon: Leandrew Koyanagi, MD;  Location: [place];  Service: Orthopedics;  Laterality: Left;   LEFT HEART CATH AND CORONARY ANGIOGRAPHY N/A 02/23/2019   Procedure: LEFT HEART CATH AND CORONARY ANGIOGRAPHY;  Surgeon: Martinique, Peter M, MD;  Location: [place] [facility];  Service: Cardiovascular;  Laterality: N/A;   LUMBAR SPINE SURGERY     x 2   NASAL SINUS  SURGERY     PECTORALIS FLAP N/A 04/04/2019   Procedure: right pectoralis muscle flap for sternal wound reconstruction and VAC placement;  Surgeon: Wallace Going, DO;  Location: [place];  Service: Plastics;  Laterality: N/A;  2.5 hours case length   REPAIR OF ACUTE ASCENDING THORACIC AORTIC DISSECTION N/A 02/23/2019   Procedure: REPAIR OF TYPE A  - ACUTE ASCENDING THORACIC AORTIC DISSECTION, using Hemashield Platinum Woven Double Velour Vascular Graft (D: 31m, L: 30cm);  Surgeon: VPrescott Gum PCollier Salina MD;  [place]  Service: Vascular;  Laterality: N/A;   STERNAL WOUND DEBRIDEMENT N/A 03/22/2019   Procedure: STERNAL WOUND DEBRIDEMENT;  Surgeon: VIvin Poot MD;  [place]  Service: Thoracic;  Laterality: N/A;   STERNAL WOUND DEBRIDEMENT N/A 03/25/2019   Procedure: SUPERFICIAL STERNAL WOUND DEBRIDEMENT;  Surgeon: VIvin Poot MD;  [place]  Service: Thoracic;  Laterality: N/A;   STERNAL WOUND DEBRIDEMENT N/A 03/29/2019   Procedure: SUPERFICIAL STERNAL WOUND DEBRIDEMENT;  Surgeon: VIvin Poot MD;  [place]  Service: Thoracic;  Laterality: N/A;   STERNAL WOUND DEBRIDEMENT N/A 03/31/2019   Procedure: STERNAL WOUND DEBRIDEMENT USING 1G ACEL POWDER;  Surgeon: VIvin Poot MD;  [place]  Service: Thoracic;  Laterality: N/A;   STERNAL WOUND DEBRIDEMENT N/A 04/04/2019   Procedure: STERNAL WOUND DEBRIDEMENT;  Surgeon: VIvin Poot MD;  [place]  Service: Thoracic;  Laterality: N/A;   TEE WITHOUT CARDIOVERSION  05/28/2012   Procedure: TRANSESOPHAGEAL ECHOCARDIOGRAM (TEE);  Surgeon: BLelon Perla MD;  [facility]  Service: Cardiovascular;  Laterality: N/A;   TEE WITHOUT CARDIOVERSION N/A 02/23/2019   Procedure: TRANSESOPHAGEAL ECHOCARDIOGRAM (TEE);  Surgeon: VPrescott Gum PCollier Salina MD;  [place]  Service: Open Heart Surgery;  Laterality: N/A;     Current Meds  Medication Sig   albuterol (PROVENTIL HFA;VENTOLIN HFA)  108 (90 Base) MCG/ACT inhaler Inhale 1 puff into the lungs every 6 (six) hours as needed for wheezing or shortness of breath.    allopurinol (ZYLOPRIM) 100 MG tablet Take 100 mg by mouth daily.    aspirin EC 81 MG EC tablet Take 1 tablet (81 mg total) by mouth daily.   atorvastatin (LIPITOR) 80 MG tablet Take 1 tablet (80 mg total) by mouth daily at 6 PM.   carvedilol (COREG) 12.5 MG tablet Take 1 tablet (12.5 mg total) by mouth 2 (two) times daily with a meal.   esomeprazole (NEXIUM) 40 MG capsule Take 40 mg by mouth 2 (two) times daily.   ferrous sulfate 325 (65 FE) MG EC tablet Take 1 tablet (325  mg total) by mouth 2 (two) times daily.   furosemide (LASIX) 40 MG tablet Take 1 tablet (40 mg total) by mouth daily.   ipratropium-albuterol (DUONEB) 0.5-2.5 (3) MG/3ML SOLN Take 3 mLs by nebulization every 8 (eight) hours as needed (wheezing, shortness of breath).   losartan (COZAAR) 100 MG tablet Take 1 tablet (100 mg total) by mouth daily.   Multiple Vitamin (MULITIVITAMIN WITH MINERALS) TABS Take 1 tablet by mouth daily.   nitroGLYCERIN (NITROSTAT) 0.4 MG SL tablet Place 0.4 mg under the tongue every 5 (five) minutes as needed. For chest pain   Omega-3 Fatty Acids (FISH OIL) 1200 MG CPDR Take 1,200 mg by mouth daily.   spironolactone (ALDACTONE) 25 MG tablet Take 0.5 tablets (12.5 mg total) by mouth daily.   traMADol (ULTRAM) 50 MG tablet Take 1-2 tablets (50-100 mg total) by mouth every 6 (six) hours as needed (mild pain).   traZODone (DESYREL) 100 MG tablet Take 100 mg by mouth at bedtime.      Allergies:   Penicillins   Social History   Tobacco Use   Smoking status: Former Smoker    Packs/day: 1.00    Years: 4.00    Pack years: 4.00    Types: Cigarettes    Last attempt to quit: 12/22/2018    Years since quitting: 0.3   Smokeless tobacco: Never Used  Substance Use Topics   Alcohol use: No    Alcohol/week: 0.0 standard drinks    Comment: no alcohol in 38yr     Drug use: No     Family Hx: The patient's family history includes Breast cancer in his mother; Cancer in his father and mother; Diabetes in his paternal grandmother and sister; Heart attack in his father and mother; Heart disease in his father and another family member; Hyperlipidemia in his father and sister; Hypertension in his brother, father, mother, and sister; Liver cancer in his mother; Liver disease in his mother. There is no history of Colon cancer.  ROS:   Please see the history of present illness.     All other systems reviewed and are negative.   Prior CV studies:   The following studies were reviewed today:  Cardiac Catheterization: 02/23/2019  Dist LM to Ost LAD lesion is 45% stenosed.  Prox Cx to Mid Cx lesion is 30% stenosed.  Prox LAD lesion is 30% stenosed.  Prox RCA lesion is 90% stenosed.  Post intervention, there is a 0% residual stenosis.  A drug-eluting stent was successfully placed using a STENT SYNERGY DES 2.5X16.  A drug-eluting stent was successfully placed using a STENT SYNERGY DES 2.75X12.  The left ventricular systolic function is normal.  LV end diastolic pressure is normal.  The left ventricular ejection fraction is 55-65% by visual estimate.   1. Single vessel obstructive CAD involving the proximal RCA 2. Normal LV function 3. Normal LVEDP 4. Successful stenting of the proximal RCA with DES x 2 5. 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   Plan: emergent Aortic CT angio. Dr. VPrescott Gumto evaluate. Will hold Brilinta until status of dissection is clarified.   Echocardiogram Complete: 03/18/2019  1. Respiratory related leftward septal shift with septal shudder. Respiratory variation in mitral inflow. Dilated IVC with blunted respiratory collapse adjacent to RA. No definite RV diastolic collapse, no signifcant RA collapse, howevere suboptimal  image quality in subcostal window. Findings suggests  effusive constrictive physiology, with possible early tamponade. BP and HR reviewed as reported in  study.  2. The left ventricle has normal systolic function with an ejection fraction of 60-65%. The cavity size was normal. Left ventricular diastolic function could not be evaluated.  3. The right ventricle has normal systolic function. The cavity was normal. There is no increase in right ventricular wall thickness.  4. Small pericardial effusion.  5. The pericardial effusion is circumferential.  6. The inferior vena cava was dilated in size with <50% respiratory variability.  7. The aortic valve was not well visualized Aortic valve regurgitation was not assessed by color flow Doppler.   Limited Echo: 03/28/2019 IMPRESSIONS   1. The left ventricle has low normal systolic function, with an ejection fraction of 50-55%. The cavity size was normal.  2. Left atrial size was moderately dilated.  3. Small pericardial effusion.  4. Small mostly apical pericardial effusion no signs of tamponade.  5. Mild thickening of the mitral valve leaflet. Mild calcification of the mitral valve leaflet.  6. Tricuspid valve regurgitation is mild-moderate.  7. The aortic valve is tricuspid Mild thickening of the aortic valve Mild calcification of the aortic valve. Aortic valve regurgitation is trivial by color flow Doppler.  8. The aortic root is normal in size and structure.  9. The interatrial septum was not assessed.  Labs/Other Tests and Data Reviewed:    EKG:  An ECG dated 03/18/2019 was personally reviewed today and demonstrated:  NSR, HR 98, with known RBBB.  Recent Labs: 02/27/2019: TSH 4.862 03/18/2019: B Natriuretic Peptide 280.2 03/28/2019: Magnesium 1.6 04/06/2019: ALT 17; BUN 26; Creatinine, Ser 0.85; Potassium 3.9; Sodium 134 04/07/2019: Hemoglobin 8.6; Platelets 381   Recent Lipid Panel Lab Results  Component Value Date/Time   CHOL 156 02/23/2019 01:14 PM   TRIG 101 02/23/2019 01:14 PM   HDL 31  (L) 02/23/2019 01:14 PM   CHOLHDL 5.0 02/23/2019 01:14 PM   LDLCALC 105 (H) 02/23/2019 01:14 PM    Wt Readings from Last 3 Encounters:  04/19/19 210 lb (95.3 kg)  04/19/19 210 lb (95.3 kg)  04/14/19 210 lb 12.8 oz (95.6 kg)     Objective:    Vital Signs:  BP 122/82    Wt 210 lb (95.3 kg)    BMI 31.93 kg/m    General: Pleasant male sounding in NAD Psych: Normal affect. Neuro: Alert and oriented X 3.  Lungs:  Respirations do not sound labored while talking on the phone.   ASSESSMENT & PLAN:    1. CAD - cath in 2010 showed 20% distal LM, 70-80% Ost LAD, 30% OM, and 40% RCA. Recent catheterization showed 90% Prox-RCA stenosis which was treated with DESx2. Did have nonobstructive CAD along the distal LM, Ost LAD, Proximal LAD, and Proximal LCx. Underwent CABG with SVG-RCA at the time of aortic dissection repair.  - continue ASA (reduced from 366m daily to 828mdaily by CT Surgery), statin, and BB therapy.   2. Dissection of Thoracic Aorta - following recent catheterization, he was noted to have an aortic root dissection felt to be related to guide catheter trauma. Underwent repair of acute type A ascending thoracic aortic dissection with a 26 mm Hemashield woven graft replacement of the ascending aorta and hemi-arch. Complicated by pericardial effusion and sternal wound dehiscence as described above.  - being followed by CT Surgery and Plastic Surgery.   3. HTN - BP has been well-controlled when checked at home. Remains on Coreg, Losartan, and Spironolactone. Can further titrate Coreg if additional BP control is needed in the future.  4. HLD - started on Atorvastatin 78m daily during admission. Goal LDL less than 70 given known CAD. Needs repeat FLP and LFT's in 6 weeks.   5. COVID-19 Education: The signs and symptoms of COVID-19 were discussed with the patient. The importance of social distancing was discussed today.  Time:   Today, I have spent 19 minutes with the patient  with telehealth technology discussing the above problems.     Medication Adjustments/Labs and Tests Ordered: Current medicines are reviewed at length with the patient today.  Concerns regarding medicines are outlined above.   Tests Ordered: No orders of the defined types were placed in this encounter.   Medication Changes: No orders of the defined types were placed in this encounter.   Disposition:  Follow up with Dr. MDomenic Politein 3 months  Signed, BErma Heritage PA-C  04/19/2019 7:43 PM    [place]

## 2019-04-19 NOTE — Patient Instructions (Signed)
Medication Instructions:  Your physician recommends that you continue on your current medications as directed. Please refer to the Current Medication list given to you today.   Labwork: NONE  Testing/Procedures: NONE  Follow-Up: Your physician recommends that you schedule a follow-up appointment in: 3 Months with Dr. [name]   Any Other Special Instructions Will Be Listed Below (If Applicable).     If you need a refill on your cardiac medications before your next appointment, please call your pharmacy. Thank you for choosing [facility]!

## 2019-04-20 ENCOUNTER — Ambulatory Visit: Payer: Medicare Other | Admitting: Plastic Surgery

## 2019-04-21 LAB — FUNGUS CULTURE WITH STAIN

## 2019-04-21 LAB — FUNGAL ORGANISM REFLEX

## 2019-04-21 LAB — FUNGUS CULTURE RESULT

## 2019-04-22 ENCOUNTER — Telehealth: Payer: Self-pay

## 2019-04-22 NOTE — Telephone Encounter (Signed)
Mr Arrona is c/o pain with taking a deep breath the past 2 hours. He denies cough, SOB, fevers.  He does not feel like it's gas and he takes Nexium daily. [facility] will get a 2V CXR today at his home. I scheduled him an appt with Dr Prescott Gum next week 04/27/19 @1330  with CXR. Patient is aware

## 2019-04-26 ENCOUNTER — Ambulatory Visit (INDEPENDENT_AMBULATORY_CARE_PROVIDER_SITE_OTHER): Payer: Medicare Other | Admitting: Plastic Surgery

## 2019-04-26 ENCOUNTER — Encounter: Payer: Self-pay | Admitting: Plastic Surgery

## 2019-04-26 ENCOUNTER — Other Ambulatory Visit: Payer: Self-pay

## 2019-04-26 DIAGNOSIS — T8132XA Disruption of internal operation (surgical) wound, not elsewhere classified, initial encounter: Secondary | ICD-10-CM

## 2019-04-26 NOTE — Progress Notes (Signed)
   Subjective:    Patient ID: [name] [name], male    DOB: 1961/04/19, 58 y.o.   MRN: [id number]  The patient is a 58 year old male who agreed for a virtual visit for follow-up on his sternal wound.  He was able to show me his sternum there does not appear to be any redness or sign of infection.  There is no drainage.  There does not appear to be a fluid collection either.  He is very pleased with his progress.  He was having some chest pain and tenderness.  Upon further discussion it sounds like it may have been related to increase in activity.  Today he is doing very well and states it is only slightly tender to touch but no actual chest pain.     Review of Systems  Constitutional: Positive for activity change. Negative for appetite change.  HENT: Negative.   Eyes: Negative.   Respiratory: Negative.  Negative for shortness of breath.   Cardiovascular: Negative.   Gastrointestinal: Negative.   Genitourinary: Negative.   Musculoskeletal: Negative.   Hematological: Negative.   Psychiatric/Behavioral: Negative.        Objective:   Physical Exam HENT:     Head: Normocephalic.  Pulmonary:     Effort: No respiratory distress.  Neurological:     Mental Status: Mental status is at baseline.  Psychiatric:        Mood and Affect: Mood normal.        Behavior: Behavior normal.        Assessment & Plan:  Sternal wound dehiscence, initial encounter  The patient gave consent to have this visit done by telemedicine / virtual visit.  This is also consent for access the chart and treat the patient via this visit. The patient is located at home.  I, the provider, am at the office.  We spent 7 minutes together for the visit.  Joined by wife in room. Follow-up in next month.

## 2019-04-27 ENCOUNTER — Ambulatory Visit
Admission: RE | Admit: 2019-04-27 | Discharge: 2019-04-27 | Disposition: A | Discharge status: Home / Self Care | Payer: Medicare Other | Source: Ambulatory Visit | Attending: Cardiothoracic Surgery | Admitting: Cardiothoracic Surgery

## 2019-04-27 ENCOUNTER — Encounter: Payer: Self-pay | Admitting: Cardiothoracic Surgery

## 2019-04-27 ENCOUNTER — Ambulatory Visit (INDEPENDENT_AMBULATORY_CARE_PROVIDER_SITE_OTHER): Payer: Self-pay | Admitting: Cardiothoracic Surgery

## 2019-04-27 VITALS — BP 140/84 | HR 96 | Temp 97.5°F | Resp 20 | Ht 68.0 in | Wt 210.0 lb

## 2019-04-27 DIAGNOSIS — Z951 Presence of aortocoronary bypass graft: Secondary | ICD-10-CM

## 2019-04-27 NOTE — Progress Notes (Signed)
PCP is Shirline Frees, MD Referring Provider is Martinique, Arav Bannister M, MD  Chief Complaint  Patient presents with  . Routine Post Op    f/u after hospital discharge with CXR     HPI: Scheduled follow-up 1 month after hospital discharge.  Patient had emergency CABG x1 with repair of type a aortic dissection in February.  In March he developed sternal dehiscence and nonhealing.  He required debridement and subsequent right pectoral muscle flap closure of the sternal incision by Dr. Marla Roe in April.  He is now home.  All the drains have been removed.  He is off antibiotics.  The sternal incision is healing well.  He has no symptoms of recurrent angina or CHF.  He still has some surgical soreness for which he takes tramadol 1 or 2 a day.  No ankle edema.  No fever.  He has lost some weight but his appetite is improving.  Past Medical History:  Diagnosis Date  . Adenomatous colon polyp 2009  . Alcohol abuse    abstinent since 1992  . Arthritis    back  . Asthma   . Barrett's esophagus 05/22/2015  . Chronic back pain   . COPD (chronic obstructive pulmonary disease) ([place])   . Coronary artery disease    a. cath in 2010 showing 20% distal LM, 70-80% Ost LAD, 30% OM, and 40% RCA b. low-risk NST in 2014 and 2016 c. 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 Underwent repair of dissection and SVG-RCA.   Marland Kitchen GERD (gastroesophageal reflux disease)    takes Nexium daily  . Gout    takes ALlopurinol daily  . Hemorrhoids   . History of bronchitis 2015  . History of kidney stones   . Hyperlipidemia    was given a script a month ago but scared to take it.Medical Md is aware  . Hypertension    takes Losartan,Metoprolol,and Amlodipine daily  . Joint pain   . Lymphocytic colitis 2009  . Myocardial infarction [facility]) 2010   "light" (admit for CP 11/2009; ruled out for MI but had 70-80% ostail LAD stenosis  and treated medically following NL perfusion study)  . NASH (nonalcoholic steatohepatitis)   . Peripheral vascular disease ([place])   . Personal history of colonic polyps 07/20/2002   Diminutive adenomas (3) 06/2002 diminutive adenoma (1) 2011   . Pre-diabetes   . Stroke ([place])   . TIA (transient ischemic attack)   . Urinary urgency     Past Surgical History:  Procedure Laterality Date  . [place] VITRECTOMY WITH 20 GAUGE MVR PORT N/A 03/25/2019   Procedure: WOUND VAC CHANGE AND IRRIGATION;  Surgeon: Ivin Poot, MD;  Location: [place];  Service: Thoracic;  Laterality: N/A;  . ANEURYSM COILING  12/2010   cerebral  . APPLICATION OF WOUND VAC N/A 03/22/2019   Procedure: APPLICATION OF WOUND VAC;  Surgeon: Ivin Poot, MD;  Location: [place];  Service: Thoracic;  Laterality: N/A;  . APPLICATION OF WOUND VAC N/A 03/29/2019   Procedure: WOUND VAC CHANGE;  Surgeon: Ivin Poot, MD;  Location: [place];  Service: Thoracic;  Laterality: N/A;  . APPLICATION OF WOUND VAC N/A 03/31/2019   Procedure: WOUND VAC CHANGE;  Surgeon: Ivin Poot, MD;  Location: [place];  Service: Thoracic;  Laterality: N/A;  . BAND HEMORRHOIDECTOMY  2003   at sigmoidoscopy  . CARDIAC CATHETERIZATION  2010  .  CHOLECYSTECTOMY  08/03/2015  . CHOLECYSTECTOMY N/A 08/03/2015   Procedure: LAPAROSCOPIC CHOLECYSTECTOMY WITH INTRAOPERATIVE CHOLANGIOGRAM;  Surgeon: Fanny Skates, MD;  Location: [place];  Service: General;  Laterality: N/A;  . COLONOSCOPY  multiple  . CORONARY ARTERY BYPASS GRAFT N/A 02/23/2019   Procedure: CORONARY ARTERY BYPASS GRAFTING (CABG) x 1; Using Endoscopically Harvested Right Leg Greater Saphenous Vein Graft (SVG); SVG to RCA;  Surgeon: Ivin Poot, MD;  Location: [place];  Service: Open Heart Surgery;  Laterality: N/A;  . CORONARY STENT INTERVENTION N/A 02/23/2019   Procedure: CORONARY STENT INTERVENTION;  Surgeon: Martinique, Bannie Lobban M, MD;  Location: [place] [facility];  Service: Cardiovascular;   Laterality: N/A;  . ESOPHAGOGASTRODUODENOSCOPY    . KNEE ARTHROSCOPY WITH MEDIAL MENISECTOMY Left 05/20/2017   Procedure: LEFT KNEE ARTHROSCOPY WITH PARTIAL MEDIAL MENISCECTOMY;  Surgeon: Leandrew Koyanagi, MD;  Location: [place];  Service: Orthopedics;  Laterality: Left;  . LEFT HEART CATH AND CORONARY ANGIOGRAPHY N/A 02/23/2019   Procedure: LEFT HEART CATH AND CORONARY ANGIOGRAPHY;  Surgeon: Martin[place], Loraine Freid M, MD;  Location: [place] [facility];  Service: Cardiovascular;  Laterality: N/A;  . LUMBAR SPINE SURGERY     x 2  . NASAL SINUS SURGERY    . PECTORALIS FLAP N/A 04/04/2019   Procedure: right pectoralis muscle flap for sternal wound reconstruction and VAC placement;  Surgeon: Wallace Going, DO;  Location: [place];  Service: Plastics;  Laterality: N/A;  2.5 hours case length  . REPAIR OF ACUTE ASCENDING THORACIC AORTIC DISSECTION N/A 02/23/2019   Procedure: REPAIR OF TYPE A  - ACUTE ASCENDING THORACIC AORTIC DISSECTION, using Hemashield Platinum Woven Double Velour Vascular Graft (D: 3m, L: 30cm);  Surgeon: VPrescott Gum PCollier Salina MD;  [place]  Service: Vascular;  Laterality: N/A;  . STERNAL WOUND DEBRIDEMENT N/A 03/22/2019   Procedure: STERNAL WOUND DEBRIDEMENT;  Surgeon: VIvin Poot MD;  [place]  Service: Thoracic;  Laterality: N/A;  . STERNAL WOUND DEBRIDEMENT N/A 03/25/2019   Procedure: SUPERFICIAL STERNAL WOUND DEBRIDEMENT;  Surgeon: VIvin Poot MD;  [place]  Service: Thoracic;  Laterality: N/A;  . STERNAL WOUND DEBRIDEMENT N/A 03/29/2019   Procedure: SUPERFICIAL STERNAL WOUND DEBRIDEMENT;  Surgeon: VIvin Poot MD;  [place]  Service: Thoracic;  Laterality: N/A;  . STERNAL WOUND DEBRIDEMENT N/A 03/31/2019   Procedure: STERNAL WOUND DEBRIDEMENT USING 1G ACEL POWDER;  Surgeon: VIvin Poot MD;  [place]  Service: Thoracic;  Laterality: N/A;  . STERNAL WOUND DEBRIDEMENT N/A 04/04/2019   Procedure: STERNAL WOUND  DEBRIDEMENT;  Surgeon: VIvin Poot MD;  [place]  Service: Thoracic;  Laterality: N/A;  . TEE WITHOUT CARDIOVERSION  05/28/2012   Procedure: TRANSESOPHAGEAL ECHOCARDIOGRAM (TEE);  Surgeon: BLelon Perla MD;  [facility];  Service: Cardiovascular;  Laterality: N/A;  . TEE WITHOUT CARDIOVERSION N/A 02/23/2019   Procedure: TRANSESOPHAGEAL ECHOCARDIOGRAM (TEE);  Surgeon: VPrescott Gum PCollier Salina MD;  [place]  Service: Open Heart Surgery;  Laterality: N/A;    Family History  Problem Relation Age of Onset  . Liver disease Mother   . Liver cancer Mother   . Breast cancer Mother   . Cancer Mother   . Hypertension Mother   . Heart attack Mother   . Cancer Father   . Heart disease Father        before age 59 . Hyperlipidemia Father   . Hypertension Father   . Heart attack Father   .  Heart disease Other        multiple family members on maternal and paternal side of family  . Diabetes Sister   . Hyperlipidemia Sister   . Hypertension Sister   . Diabetes Paternal Grandmother   . Hypertension Brother   . Colon cancer Neg Hx     Social History Social History   Tobacco Use  . Smoking status: Former Smoker    Packs/day: 1.00    Years: 4.00    Pack years: 4.00    Types: Cigarettes    Last attempt to quit: 12/22/2018    Years since quitting: 0.3  . Smokeless tobacco: Never Used  Substance Use Topics  . Alcohol use: No    Alcohol/week: 0.0 standard drinks    Comment: no alcohol in 50yr   . Drug use: No    Current Outpatient Medications  Medication Sig Dispense Refill  . albuterol (PROVENTIL HFA;VENTOLIN HFA) 108 (90 Base) MCG/ACT inhaler Inhale 1 puff into the lungs every 6 (six) hours as needed for wheezing or shortness of breath.     . allopurinol (ZYLOPRIM) 100 MG tablet Take 100 mg by mouth daily.     .Marland Kitchenaspirin EC 81 MG EC tablet Take 1 tablet (81 mg total) by mouth daily.    .Marland Kitchenatorvastatin (LIPITOR) 80 MG tablet Take 1 tablet (80 mg total) by  mouth daily at 6 PM. 30 tablet 2  . carvedilol (COREG) 12.5 MG tablet Take 1 tablet (12.5 mg total) by mouth 2 (two) times daily with a meal. 60 tablet 2  . esomeprazole (NEXIUM) 40 MG capsule Take 40 mg by mouth 2 (two) times daily.    . ferrous sulfate 325 (65 FE) MG EC tablet Take 1 tablet (325 mg total) by mouth 2 (two) times daily. 60 tablet 0  . furosemide (LASIX) 40 MG tablet Take 1 tablet (40 mg total) by mouth daily. 30 tablet 2  . ipratropium-albuterol (DUONEB) 0.5-2.5 (3) MG/3ML SOLN Take 3 mLs by nebulization every 8 (eight) hours as needed (wheezing, shortness of breath). 360 mL 0  . losartan (COZAAR) 100 MG tablet Take 1 tablet (100 mg total) by mouth daily. 30 tablet 2  . Multiple Vitamin (MULITIVITAMIN WITH MINERALS) TABS Take 1 tablet by mouth daily.    . nitroGLYCERIN (NITROSTAT) 0.4 MG SL tablet Place 0.4 mg under the tongue every 5 (five) minutes as needed. For chest pain    . Omega-3 Fatty Acids (FISH OIL) 1200 MG CPDR Take 1,200 mg by mouth daily.    .Marland Kitchenspironolactone (ALDACTONE) 25 MG tablet Take 0.5 tablets (12.5 mg total) by mouth daily. 30 tablet 2  . traMADol (ULTRAM) 50 MG tablet Take 1-2 tablets (50-100 mg total) by mouth every 6 (six) hours as needed (mild pain). 40 tablet 0  . traZODone (DESYREL) 100 MG tablet Take 100 mg by mouth at bedtime.      No current facility-administered medications for this visit.     Allergies  Allergen Reactions  . Penicillins Hives, Rash and Other (See Comments)    Did it involve swelling of the face/tongue/throat, SOB, or low BP? Yes Did it involve sudden or severe rash/hives, skin peeling, or any reaction on the inside of your mouth or nose? Yes  Did you need to seek medical attention at a hospital or doctor's office? Yes When did it last happen?As a child. If all above answers are "NO", may proceed with cephalosporin use.     Review of Systems  No drainage from the sternal incision No shortness of breath He is  walking approximately half a mile every day.  BP 140/84   Pulse 96   Temp (!) 97.5 F (36.4 C) (Temporal)   Resp 20   Ht 5' 8"  (1.727 m)   Wt 210 lb (95.3 kg)   SpO2 96% Comment: RA  BMI 31.93 kg/m  Physical Exam       Exam    General- alert and comfortable    Neck- no JVD, no cervical adenopathy palpable, no carotid bruit   Lungs- clear without rales, wheezes   Sternal incision-well-healed without erythema.  Sternum stable.      A few Monocryl sutures remain.  Drain sites clean and dry.   Cor- regular rate and rhythm, no murmur , gallop   Abdomen- soft, non-tender   Extremities - warm, non-tender, minimal edema   Neuro- oriented, appropriate, no focal weakness   Diagnostic Tests: Chest x-ray today is clear  Impression: Doing well after emergency repair of type a dissection with CABG and subsequent sternal reconstruction with right pectoralis muscle flap.  Plan: Patient may drive in 2 weeks.  He knows not to lift more than 5 pounds until he returns for his next office visit and wound check in 4 weeks.  He understands that he will be unable to obtain any more narcotic pain medication and he should continue the nonnarcotic Tramadol cautiously.    Len Childs, MD [facility] [phone]

## 2019-04-30 ENCOUNTER — Other Ambulatory Visit: Payer: Self-pay | Admitting: Physician Assistant

## 2019-05-11 LAB — ACID FAST CULTURE WITH REFLEXED SENSITIVITIES (MYCOBACTERIA): Acid Fast Culture: NEGATIVE

## 2019-05-20 ENCOUNTER — Other Ambulatory Visit: Payer: Self-pay

## 2019-05-20 ENCOUNTER — Ambulatory Visit (INDEPENDENT_AMBULATORY_CARE_PROVIDER_SITE_OTHER): Payer: Medicare Other | Admitting: Nurse Practitioner

## 2019-05-20 ENCOUNTER — Encounter: Payer: Self-pay | Admitting: Plastic Surgery

## 2019-05-20 VITALS — BP 183/94 | HR 69 | Temp 98.8°F | Ht 68.0 in | Wt 225.0 lb

## 2019-05-20 DIAGNOSIS — T8132XD Disruption of internal operation (surgical) wound, not elsewhere classified, subsequent encounter: Secondary | ICD-10-CM

## 2019-05-20 NOTE — Progress Notes (Signed)
Patient ID: [name] [name], male    DOB: 07-26-1961, 58 y.o.   MRN: [id number]   C.C.: wound follow up  [name] [name] is a 58 yo male who presents for follow up of a sternal wound repair with a pectoralis muscle flap on 04/04/19. He is doing very well today and is happy with his progress. He has been wearing the binder. He does have numbness in his sternum.    Review of Systems  Constitutional: Negative.   HENT: Negative.   Respiratory: Negative.   Cardiovascular: Negative.   Gastrointestinal: Negative.   Genitourinary: Negative.   Musculoskeletal: Negative.        Numbness in sternum  Neurological: Negative.     Past Medical History:  Diagnosis Date  . Adenomatous colon polyp 2009  . Alcohol abuse    abstinent since 1992  . Arthritis    back  . Asthma   . Barrett's esophagus 05/22/2015  . Chronic back pain   . COPD (chronic obstructive pulmonary disease) ([place])   . Coronary artery disease    a. cath in 2010 showing 20% distal LM, 70-80% Ost LAD, 30% OM, and 40% RCA b. low-risk NST in 2014 and 2016 c. 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 Underwent repair of dissection and SVG-RCA.   Marland Kitchen GERD (gastroesophageal reflux disease)    takes Nexium daily  . Gout    takes ALlopurinol daily  . Hemorrhoids   . History of bronchitis 2015  . History of kidney stones   . Hyperlipidemia    was given a script a month ago but scared to take it.Medical Md is aware  . Hypertension    takes Losartan,Metoprolol,and Amlodipine daily  . Joint pain   . Lymphocytic colitis 2009  . Myocardial infarction [facility]) 2010   "light" (admit for CP 11/2009; ruled out for MI but had 70-80% ostail LAD stenosis and treated medically following NL perfusion study)  . NASH (nonalcoholic steatohepatitis)   . Peripheral vascular disease ([place])   . Personal history of colonic polyps 07/20/2002   Diminutive adenomas (3) 06/2002 diminutive adenoma (1) 2011   . Pre-diabetes   . Stroke ([place])   . TIA (transient ischemic attack)   . Urinary urgency     Past Surgical History:  Procedure Laterality Date  . [place] VITRECTOMY WITH 20 GAUGE MVR PORT N/A 03/25/2019   Procedure: WOUND VAC CHANGE AND IRRIGATION;  Surgeon: Ivin Poot, MD;  Location: [place];  Service: Thoracic;  Laterality: N/A;  . ANEURYSM COILING  12/2010   cerebral  . APPLICATION OF WOUND VAC N/A 03/22/2019   Procedure: APPLICATION OF WOUND VAC;  Surgeon: Ivin Poot, MD;  Location: [place];  Service: Thoracic;  Laterality: N/A;  . APPLICATION OF WOUND VAC N/A 03/29/2019   Procedure: WOUND VAC CHANGE;  Surgeon: Ivin Poot, MD;  Location: [place];  Service: Thoracic;  Laterality: N/A;  . APPLICATION OF WOUND VAC N/A 03/31/2019   Procedure: WOUND VAC CHANGE;  Surgeon: Ivin Poot, MD;  Location: [place];  Service: Thoracic;  Laterality: N/A;  . BAND HEMORRHOIDECTOMY  2003   at sigmoidoscopy  . CARDIAC CATHETERIZATION  2010  . CHOLECYSTECTOMY  08/03/2015  . CHOLECYSTECTOMY N/A 08/03/2015   Procedure: LAPAROSCOPIC CHOLECYSTECTOMY WITH INTRAOPERATIVE CHOLANGIOGRAM;  Surgeon: Fanny Skates, MD;  Location: [place];  Service: General;  Laterality: N/A;  .  COLONOSCOPY  multiple  . CORONARY ARTERY BYPASS GRAFT N/A 02/23/2019   Procedure: CORONARY ARTERY BYPASS GRAFTING (CABG) x 1; Using Endoscopically Harvested Right Leg Greater Saphenous Vein Graft (SVG); SVG to RCA;  Surgeon: Ivin Poot, MD;  Location: [place];  Service: Open Heart Surgery;  Laterality: N/A;  . CORONARY STENT INTERVENTION N/A 02/23/2019   Procedure: CORONARY STENT INTERVENTION;  Surgeon: Martinique, Peter M, MD;  Location: [place] [facility];  Service: Cardiovascular;  Laterality: N/A;  . ESOPHAGOGASTRODUODENOSCOPY    . KNEE ARTHROSCOPY WITH MEDIAL MENISECTOMY Left 05/20/2017   Procedure: LEFT KNEE ARTHROSCOPY WITH PARTIAL MEDIAL MENISCECTOMY;   Surgeon: Leandrew Koyanagi, MD;  Location: [place];  Service: Orthopedics;  Laterality: Left;  . LEFT HEART CATH AND CORONARY ANGIOGRAPHY N/A 02/23/2019   Procedure: LEFT HEART CATH AND CORONARY ANGIOGRAPHY;  Surgeon: Martinique, Peter M, MD;  Location: [place] [facility];  Service: Cardiovascular;  Laterality: N/A;  . LUMBAR SPINE SURGERY     x 2  . NASAL SINUS SURGERY    . PECTORALIS FLAP N/A 04/04/2019   Procedure: right pectoralis muscle flap for sternal wound reconstruction and VAC placement;  Surgeon: Wallace Going, DO;  Location: [place];  Service: Plastics;  Laterality: N/A;  2.5 hours case length  . REPAIR OF ACUTE ASCENDING THORACIC AORTIC DISSECTION N/A 02/23/2019   Procedure: REPAIR OF TYPE A  - ACUTE ASCENDING THORACIC AORTIC DISSECTION, using Hemashield Platinum Woven Double Velour Vascular Graft (D: 25m, L: 30cm);  Surgeon: VPrescott Gum PCollier Salina MD;  [place]  Service: Vascular;  Laterality: N/A;  . STERNAL WOUND DEBRIDEMENT N/A 03/22/2019   Procedure: STERNAL WOUND DEBRIDEMENT;  Surgeon: VIvin Poot MD;  [place]  Service: Thoracic;  Laterality: N/A;  . STERNAL WOUND DEBRIDEMENT N/A 03/25/2019   Procedure: SUPERFICIAL STERNAL WOUND DEBRIDEMENT;  Surgeon: VIvin Poot MD;  [place]  Service: Thoracic;  Laterality: N/A;  . STERNAL WOUND DEBRIDEMENT N/A 03/29/2019   Procedure: SUPERFICIAL STERNAL WOUND DEBRIDEMENT;  Surgeon: VIvin Poot MD;  [place]  Service: Thoracic;  Laterality: N/A;  . STERNAL WOUND DEBRIDEMENT N/A 03/31/2019   Procedure: STERNAL WOUND DEBRIDEMENT USING 1G ACEL POWDER;  Surgeon: VIvin Poot MD;  [place]  Service: Thoracic;  Laterality: N/A;  . STERNAL WOUND DEBRIDEMENT N/A 04/04/2019   Procedure: STERNAL WOUND DEBRIDEMENT;  Surgeon: VIvin Poot MD;  [place]  Service: Thoracic;  Laterality: N/A;  . TEE WITHOUT CARDIOVERSION  05/28/2012   Procedure: TRANSESOPHAGEAL ECHOCARDIOGRAM  (TEE);  Surgeon: BLelon Perla MD;  [facility]  Service: Cardiovascular;  Laterality: N/A;  . TEE WITHOUT CARDIOVERSION N/A 02/23/2019   Procedure: TRANSESOPHAGEAL ECHOCARDIOGRAM (TEE);  Surgeon: VPrescott Gum PCollier Salina MD;  [place]  Service: Open Heart Surgery;  Laterality: N/A;      Current Outpatient Medications:  .  albuterol (PROVENTIL HFA;VENTOLIN HFA) 108 (90 Base) MCG/ACT inhaler, Inhale 1 puff into the lungs every 6 (six) hours as needed for wheezing or shortness of breath. , Disp: , Rfl:  .  allopurinol (ZYLOPRIM) 100 MG tablet, Take 100 mg by mouth daily. , Disp: , Rfl:  .  aspirin EC 81 MG EC tablet, Take 1 tablet (81 mg total) by mouth daily., Disp: , Rfl:  .  atorvastatin (LIPITOR) 80 MG tablet, Take 1 tablet (80 mg total) by mouth daily at 6 PM., Disp: 30 tablet, Rfl: 2 .  carvedilol (COREG) 12.5 MG tablet,  Take 1 tablet (12.5 mg total) by mouth 2 (two) times daily with a meal., Disp: 60 tablet, Rfl: 2 .  esomeprazole (NEXIUM) 40 MG capsule, Take 40 mg by mouth 2 (two) times daily., Disp: , Rfl:  .  ferrous sulfate 325 (65 FE) MG EC tablet, Take 1 tablet by mouth twice daily, Disp: 60 tablet, Rfl: 0 .  furosemide (LASIX) 40 MG tablet, Take 1 tablet (40 mg total) by mouth daily., Disp: 30 tablet, Rfl: 2 .  ipratropium-albuterol (DUONEB) 0.5-2.5 (3) MG/3ML SOLN, Take 3 mLs by nebulization every 8 (eight) hours as needed (wheezing, shortness of breath)., Disp: 360 mL, Rfl: 0 .  losartan (COZAAR) 100 MG tablet, Take 1 tablet (100 mg total) by mouth daily., Disp: 30 tablet, Rfl: 2 .  Multiple Vitamin (MULITIVITAMIN WITH MINERALS) TABS, Take 1 tablet by mouth daily., Disp: , Rfl:  .  nitroGLYCERIN (NITROSTAT) 0.4 MG SL tablet, Place 0.4 mg under the tongue every 5 (five) minutes as needed. For chest pain, Disp: , Rfl:  .  Omega-3 Fatty Acids (FISH OIL) 1200 MG CPDR, Take 1,200 mg by mouth daily., Disp: , Rfl:  .  spironolactone (ALDACTONE) 25 MG tablet, Take 0.5  tablets (12.5 mg total) by mouth daily., Disp: 30 tablet, Rfl: 2 .  traMADol (ULTRAM) 50 MG tablet, Take 1-2 tablets (50-100 mg total) by mouth every 6 (six) hours as needed (mild pain)., Disp: 40 tablet, Rfl: 0 .  traZODone (DESYREL) 100 MG tablet, Take 100 mg by mouth at bedtime. , Disp: , Rfl:    Objective:   Vitals:   05/20/19 1322  BP: (!) 183/94  Pulse: 69  Temp: 98.8 F (37.1 C)  SpO2: 96%    Physical Exam  General: alert, calm, no acute distress HEENT: normal Neck: normal Chest: slight asymmetry of chest with right pectoralis slightly raised than left, multiple healed scars, non-tender  Lungs: unlabored breathing Abdomen: obese, multiple healed scars Musculoskeletal: MAEx4 Neuro: A&O x3, calm, cooperative, steady gait Skin: "see chest and abdomen"   Assessment & Plan:  [name] [name] is a 58 yo male s/p sternal wound repair with a pectoralis muscle flap on 04/04/19. He has healed very well. No signs of infection. Nice cosmetic result. The numbness is normal and may last a while longer. May d/c binder. Follow up as needed.    Picture placed in chart with patient's consent.    Alfredo Batty, NP

## 2019-06-01 ENCOUNTER — Ambulatory Visit: Payer: Medicare Other | Admitting: Cardiothoracic Surgery

## 2019-06-02 ENCOUNTER — Other Ambulatory Visit: Payer: Self-pay

## 2019-06-03 ENCOUNTER — Ambulatory Visit (INDEPENDENT_AMBULATORY_CARE_PROVIDER_SITE_OTHER): Payer: Medicare Other | Admitting: Cardiothoracic Surgery

## 2019-06-03 ENCOUNTER — Encounter: Payer: Self-pay | Admitting: Cardiothoracic Surgery

## 2019-06-03 VITALS — BP 195/95 | HR 81 | Temp 97.9°F | Resp 20 | Ht 68.0 in | Wt 226.0 lb

## 2019-06-03 DIAGNOSIS — Z09 Encounter for follow-up examination after completed treatment for conditions other than malignant neoplasm: Secondary | ICD-10-CM

## 2019-06-03 DIAGNOSIS — Z951 Presence of aortocoronary bypass graft: Secondary | ICD-10-CM

## 2019-06-03 MED ORDER — AMLODIPINE BESYLATE 10 MG PO TABS: 10.0000 mg | ORAL_TABLET | Freq: Every day | ORAL | 0 refills | Status: DC

## 2019-06-03 MED ORDER — CARVEDILOL 25 MG PO TABS: 12.5000 mg | ORAL_TABLET | Freq: Two times a day (BID) | ORAL | 3 refills | Status: DC

## 2019-06-03 NOTE — Progress Notes (Signed)
PCP is Shirline Frees, MD Referring Provider is [place], Enrigue Hashimi M, MD  Chief Complaint  Patient presents with  . Routine Post Op    4 week f/u    HPI: The patient returns for scheduled postop follow-up. 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  Patient developed sternal dehiscence and nonhealing required pectoralis muscle flap 2 months later.  He is now all healed and functioning well.  He does have chronic low back pain and uses a cane.  Patient also has hypertension which is poorly controlled today.  Today his blood pressure is [phone] systolic. We talked at length about the dangers of high blood pressure especially with previous aortic surgery. He will double his carvedilol dose up to 25 mg twice daily,, continue his losartan 100 mg daily and start Norvasc 10 mg daily.  He will keep his blood pressure records and I will see him back for blood pressure check in 3 weeks.   Past Medical History:  Diagnosis Date  . Adenomatous colon polyp 2009  . Alcohol abuse    abstinent since 1992  . Arthritis    back  . Asthma   . Barrett's esophagus 05/22/2015  . Chronic back pain   . COPD (chronic obstructive pulmonary disease) ([place])   . Coronary artery disease    a. cath in 2010 showing 20% distal LM, 70-80% Ost LAD, 30% OM, and 40% RCA b. low-risk NST in 2014 and 2016 c. 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 Underwent repair of dissection and SVG-RCA.   Marland Kitchen GERD (gastroesophageal reflux disease)    takes Nexium daily  . Gout    takes ALlopurinol daily  . Hemorrhoids   . History of bronchitis 2015  . History of kidney stones   . Hyperlipidemia    was given a script a month ago but scared to take it.Medical Md is aware  . Hypertension    takes Losartan,Metoprolol,and Amlodipine daily  . Joint pain   . Lymphocytic  colitis 2009  . Myocardial infarction [facility]) 2010   "light" (admit for CP 11/2009; ruled out for MI but had 70-80% ostail LAD stenosis and treated medically following NL perfusion study)  . NASH (nonalcoholic steatohepatitis)   . Peripheral vascular disease ([place])   . Personal history of colonic polyps 07/20/2002   Diminutive adenomas (3) 06/2002 diminutive adenoma (1) 2011   . Pre-diabetes   . Stroke ([place])   . TIA (transient ischemic attack)   . Urinary urgency     Past Surgical History:  Procedure Laterality Date  . [place] VITRECTOMY WITH 20 GAUGE MVR PORT N/A 03/25/2019   Procedure: WOUND VAC CHANGE AND IRRIGATION;  Surgeon: Ivin Poot, MD;  Location: [place];  Service: Thoracic;  Laterality: N/A;  . ANEURYSM COILING  12/2010   cerebral  . APPLICATION OF WOUND VAC N/A 03/22/2019   Procedure: APPLICATION OF WOUND VAC;  Surgeon: Ivin Poot, MD;  Location: [place];  Service: Thoracic;  Laterality: N/A;  . APPLICATION OF WOUND VAC N/A 03/29/2019   Procedure: WOUND VAC CHANGE;  Surgeon: Ivin Poot, MD;  Location: [place];  Service: Thoracic;  Laterality: N/A;  . APPLICATION OF WOUND VAC N/A 03/31/2019   Procedure: WOUND VAC CHANGE;  Surgeon: Ivin Poot, MD;  Location: [place];  Service: Thoracic;  Laterality: N/A;  . BAND  HEMORRHOIDECTOMY  2003   at sigmoidoscopy  . CARDIAC CATHETERIZATION  2010  . CHOLECYSTECTOMY  08/03/2015  . CHOLECYSTECTOMY N/A 08/03/2015   Procedure: LAPAROSCOPIC CHOLECYSTECTOMY WITH INTRAOPERATIVE CHOLANGIOGRAM;  Surgeon: Fanny Skates, MD;  Location: [place];  Service: General;  Laterality: N/A;  . COLONOSCOPY  multiple  . CORONARY ARTERY BYPASS GRAFT N/A 02/23/2019   Procedure: CORONARY ARTERY BYPASS GRAFTING (CABG) x 1; Using Endoscopically Harvested Right Leg Greater Saphenous Vein Graft (SVG); SVG to RCA;  Surgeon: Ivin Poot, MD;  Location: [place];  Service: Open Heart Surgery;  Laterality: N/A;  . CORONARY STENT INTERVENTION N/A  02/23/2019   Procedure: CORONARY STENT INTERVENTION;  Surgeon: [place], Shaheed Schmuck M, MD;  Location: [place] CV LAB;  Service: Cardiovascular;  Laterality: N/A;  . ESOPHAGOGASTRODUODENOSCOPY    . KNEE ARTHROSCOPY WITH MEDIAL MENISECTOMY Left 05/20/2017   Procedure: LEFT KNEE ARTHROSCOPY WITH PARTIAL MEDIAL MENISCECTOMY;  Surgeon: Leandrew Koyanagi, MD;  Location: [place];  Service: Orthopedics;  Laterality: Left;  . LEFT HEART CATH AND CORONARY ANGIOGRAPHY N/A 02/23/2019   Procedure: LEFT HEART CATH AND CORONARY ANGIOGRAPHY;  Surgeon: [place], Cross Jorge M, MD;  Location: [place] CV [facility];  Service: Cardiovascular;  Laterality: N/A;  . LUMBAR SPINE SURGERY     x 2  . NASAL SINUS SURGERY    . PECTORALIS FLAP N/A 04/04/2019   Procedure: right pectoralis muscle flap for sternal wound reconstruction and VAC placement;  Surgeon: Wallace Going, DO;  Location: [place];  Service: Plastics;  Laterality: N/A;  2.5 hours case length  . REPAIR OF ACUTE ASCENDING THORACIC AORTIC DISSECTION N/A 02/23/2019   Procedure: REPAIR OF TYPE A  - ACUTE ASCENDING THORACIC AORTIC DISSECTION, using Hemashield Platinum Woven Double Velour Vascular Graft (D: 56m, L: 30cm);  Surgeon: VPrescott Gum PCollier Salina MD;  [place]  Service: Vascular;  Laterality: N/A;  . STERNAL WOUND DEBRIDEMENT N/A 03/22/2019   Procedure: STERNAL WOUND DEBRIDEMENT;  Surgeon: VIvin Poot MD;  [place]  Service: Thoracic;  Laterality: N/A;  . STERNAL WOUND DEBRIDEMENT N/A 03/25/2019   Procedure: SUPERFICIAL STERNAL WOUND DEBRIDEMENT;  Surgeon: VIvin Poot MD;  [place]  Service: Thoracic;  Laterality: N/A;  . STERNAL WOUND DEBRIDEMENT N/A 03/29/2019   Procedure: SUPERFICIAL STERNAL WOUND DEBRIDEMENT;  Surgeon: VIvin Poot MD;  [place]  Service: Thoracic;  Laterality: N/A;  . STERNAL WOUND DEBRIDEMENT N/A 03/31/2019   Procedure: STERNAL WOUND DEBRIDEMENT USING 1G ACEL POWDER;  Surgeon: VIvin Poot  MD;  [place]  Service: Thoracic;  Laterality: N/A;  . STERNAL WOUND DEBRIDEMENT N/A 04/04/2019   Procedure: STERNAL WOUND DEBRIDEMENT;  Surgeon: VIvin Poot MD;  [place]  Service: Thoracic;  Laterality: N/A;  . TEE WITHOUT CARDIOVERSION  05/28/2012   Procedure: TRANSESOPHAGEAL ECHOCARDIOGRAM (TEE);  Surgeon: BLelon Perla MD;  [facility]  Service: Cardiovascular;  Laterality: N/A;  . TEE WITHOUT CARDIOVERSION N/A 02/23/2019   Procedure: TRANSESOPHAGEAL ECHOCARDIOGRAM (TEE);  Surgeon: VPrescott Gum PCollier Salina MD;  [place]  Service: Open Heart Surgery;  Laterality: N/A;    Family History  Problem Relation Age of Onset  . Liver disease Mother   . Liver cancer Mother   . Breast cancer Mother   . Cancer Mother   . Hypertension Mother   . Heart attack Mother   . Cancer Father   . Heart disease Father        before age 59 .  Hyperlipidemia Father   . Hypertension Father   . Heart attack Father   . Heart disease Other        multiple family members on maternal and paternal side of family  . Diabetes Sister   . Hyperlipidemia Sister   . Hypertension Sister   . Diabetes Paternal Grandmother   . Hypertension Brother   . Colon cancer Neg Hx     Social History Social History   Tobacco Use  . Smoking status: Former Smoker    Packs/day: 1.00    Years: 4.00    Pack years: 4.00    Types: Cigarettes    Last attempt to quit: 12/22/2018    Years since quitting: 0.4  . Smokeless tobacco: Never Used  Substance Use Topics  . Alcohol use: No    Alcohol/week: 0.0 standard drinks    Comment: no alcohol in 70yr   . Drug use: No    Current Outpatient Medications  Medication Sig Dispense Refill  . albuterol (PROVENTIL HFA;VENTOLIN HFA) 108 (90 Base) MCG/ACT inhaler Inhale 1 puff into the lungs every 6 (six) hours as needed for wheezing or shortness of breath.     . allopurinol (ZYLOPRIM) 100 MG tablet Take 100 mg by mouth daily.     .Marland Kitchenaspirin EC 81  MG EC tablet Take 1 tablet (81 mg total) by mouth daily.    .Marland Kitchenatorvastatin (LIPITOR) 80 MG tablet Take 1 tablet (80 mg total) by mouth daily at 6 PM. 30 tablet 2  . carvedilol (COREG) 12.5 MG tablet Take 1 tablet (12.5 mg total) by mouth 2 (two) times daily with a meal. 60 tablet 2  . esomeprazole (NEXIUM) 40 MG capsule Take 40 mg by mouth 2 (two) times daily.    . ferrous sulfate 325 (65 FE) MG EC tablet Take 1 tablet by mouth twice daily 60 tablet 0  . furosemide (LASIX) 40 MG tablet Take 1 tablet (40 mg total) by mouth daily. 30 tablet 2  . ipratropium-albuterol (DUONEB) 0.5-2.5 (3) MG/3ML SOLN Take 3 mLs by nebulization every 8 (eight) hours as needed (wheezing, shortness of breath). 360 mL 0  . losartan (COZAAR) 100 MG tablet Take 1 tablet (100 mg total) by mouth daily. 30 tablet 2  . Multiple Vitamin (MULITIVITAMIN WITH MINERALS) TABS Take 1 tablet by mouth daily.    . nitroGLYCERIN (NITROSTAT) 0.4 MG SL tablet Place 0.4 mg under the tongue every 5 (five) minutes as needed. For chest pain    . Omega-3 Fatty Acids (FISH OIL) 1200 MG CPDR Take 1,200 mg by mouth daily.    .Marland Kitchenspironolactone (ALDACTONE) 25 MG tablet Take 0.5 tablets (12.5 mg total) by mouth daily. 30 tablet 2  . traZODone (DESYREL) 100 MG tablet Take 100 mg by mouth at bedtime.      No current facility-administered medications for this visit.     Allergies  Allergen Reactions  . Penicillins Hives, Rash and Other (See Comments)    Did it involve swelling of the face/tongue/throat, SOB, or low BP? Yes Did it involve sudden or severe rash/hives, skin peeling, or any reaction on the inside of your mouth or nose? Yes  Did you need to seek medical attention at a hospital or doctor's office? Yes When did it last happen?As a child. If all above answers are "NO", may proceed with cephalosporin use.     Review of Systems   Some numbness in chest incision Chronic low back pain Right hand numbness improving, right hand  weakness resolved  BP (!) 195/95 (BP Location: Right Arm, Cuff Size: Normal)   Pulse 81   Temp 97.9 F (36.6 C) (Skin)   Resp 20   Ht 5' 8"  (1.727 m)   Wt 226 lb (102.5 kg)   SpO2 96% Comment: RA  BMI 34.36 kg/m  Physical Exam      Exam    General- alert and comfortable    Neck- no JVD, no cervical adenopathy palpable, no carotid bruit   Lungs- clear without rales, wheezes   Cor- regular rate and rhythm, no murmur , gallop   Abdomen- soft, non-tender   Extremities - warm, non-tender, minimal edema   Neuro- oriented, appropriate, no focal weakness   Diagnostic Tests: None  Impression: Poorly controlled hypertension, especially troublesome because of history of recent aortic surgery for dissection.  Plan: His blood pressure medications are adjusted as noted above.  I have sent his new prescriptions to the [facility] in [place].  Is able not to lift up to 10 pounds.  Continue blood pressure monitoring and compliance with blood pressure medicines.  Importance of heart healthy diet and lifestyle reinforced to patient.  Patient will need a CTA of his thoracic aorta later this summer about 6 months postop-August 2020.  Len Childs, MD [facility] [phone]

## 2019-06-14 ENCOUNTER — Other Ambulatory Visit: Payer: Self-pay | Admitting: Physician Assistant

## 2019-06-23 ENCOUNTER — Other Ambulatory Visit: Payer: Self-pay

## 2019-06-24 ENCOUNTER — Encounter: Payer: Self-pay | Admitting: Cardiothoracic Surgery

## 2019-06-24 ENCOUNTER — Ambulatory Visit (INDEPENDENT_AMBULATORY_CARE_PROVIDER_SITE_OTHER): Payer: Medicare Other | Admitting: Cardiothoracic Surgery

## 2019-06-24 VITALS — BP 159/79 | HR 83 | Temp 97.8°F | Resp 20 | Ht 68.0 in | Wt 232.0 lb

## 2019-06-24 DIAGNOSIS — Z09 Encounter for follow-up examination after completed treatment for conditions other than malignant neoplasm: Secondary | ICD-10-CM

## 2019-06-24 DIAGNOSIS — Z951 Presence of aortocoronary bypass graft: Secondary | ICD-10-CM

## 2019-06-24 MED ORDER — METOPROLOL TARTRATE 50 MG PO TABS: 50.0000 mg | ORAL_TABLET | Freq: Two times a day (BID) | ORAL | 1 refills | Status: DC

## 2019-06-24 NOTE — Progress Notes (Signed)
PCP is Shirline Frees, MD Referring Provider is [place],  M, MD  Chief Complaint  Patient presents with  . Routine Post Op    3 week f/u, BP Check, HX of CABG     HPI: Patient returns for blood pressure check.  He has hypertension.  He had a emergency combined CABG and repair of iatrogenic aortic dissection during attempted PCI of the right coronary artery.  He has been taking losartan 100 mg, amlodipine 10 mg, and carvedilol 12.5 mg twice daily.  His blood pressure still exceeds 160 at times with his home monitor.  He has normal LV function and a follow-up CT scan showed an intact repair of his ascending aortic graft.  Because of his elevated systolic pressures I will start him on metoprolol tartrate 50 mg twice daily and stop the carvedilol.  He has an appointment to see his cardiologist Dr. Sherryle Lis in [place] later this summer.  He is doing well now without chest pain other than some nerve pain and no symptoms of CHF.   Past Medical History:  Diagnosis Date  . Adenomatous colon polyp 2009  . Alcohol abuse    abstinent since 1992  . Arthritis    back  . Asthma   . Barrett's esophagus 05/22/2015  . Chronic back pain   . COPD (chronic obstructive pulmonary disease) ([place])   . Coronary artery disease    a. cath in 2010 showing 20% distal LM, 70-80% Ost LAD, 30% OM, and 40% RCA b. low-risk NST in 2014 and 2016 c. 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 Underwent repair of dissection and SVG-RCA.   Marland Kitchen GERD (gastroesophageal reflux disease)    takes Nexium daily  . Gout    takes ALlopurinol daily  . Hemorrhoids   . History of bronchitis 2015  . History of kidney stones   . Hyperlipidemia    was given a script a month ago but scared to take it.Medical Md is aware  . Hypertension    takes Losartan,Metoprolol,and Amlodipine daily  . Joint pain   . Lymphocytic colitis 2009  .  Myocardial infarction [facility]) 2010   "light" (admit for CP 11/2009; ruled out for MI but had 70-80% ostail LAD stenosis and treated medically following NL perfusion study)  . NASH (nonalcoholic steatohepatitis)   . Peripheral vascular disease ([place])   . Personal history of colonic polyps 07/20/2002   Diminutive adenomas (3) 06/2002 diminutive adenoma (1) 2011   . Pre-diabetes   . Stroke ([place])   . TIA (transient ischemic attack)   . Urinary urgency     Past Surgical History:  Procedure Laterality Date  . [place] VITRECTOMY WITH 20 GAUGE MVR PORT N/A 03/25/2019   Procedure: WOUND VAC CHANGE AND IRRIGATION;  Surgeon: Ivin Poot, MD;  Location: [place];  Service: Thoracic;  Laterality: N/A;  . ANEURYSM COILING  12/2010   cerebral  . APPLICATION OF WOUND VAC N/A 03/22/2019   Procedure: APPLICATION OF WOUND VAC;  Surgeon: Ivin Poot, MD;  Location: [place];  Service: Thoracic;  Laterality: N/A;  . APPLICATION OF WOUND VAC N/A 03/29/2019   Procedure: WOUND VAC CHANGE;  Surgeon: Ivin Poot, MD;  Location: [place];  Service: Thoracic;  Laterality: N/A;  . APPLICATION OF WOUND VAC N/A 03/31/2019   Procedure: WOUND VAC CHANGE;  Surgeon: Ivin Poot, MD;  Location: [place];  Service: Thoracic;  Laterality: N/A;  . BAND HEMORRHOIDECTOMY  2003   at sigmoidoscopy  . CARDIAC CATHETERIZATION  2010  . CHOLECYSTECTOMY  08/03/2015  . CHOLECYSTECTOMY N/A 08/03/2015   Procedure: LAPAROSCOPIC CHOLECYSTECTOMY WITH INTRAOPERATIVE CHOLANGIOGRAM;  Surgeon: Fanny Skates, MD;  Location: [place];  Service: General;  Laterality: N/A;  . COLONOSCOPY  multiple  . CORONARY ARTERY BYPASS GRAFT N/A 02/23/2019   Procedure: CORONARY ARTERY BYPASS GRAFTING (CABG) x 1; Using Endoscopically Harvested Right Leg Greater Saphenous Vein Graft (SVG); SVG to RCA;  Surgeon: Ivin Poot, MD;  Location: [place];  Service: Open Heart Surgery;  Laterality: N/A;  . CORONARY STENT INTERVENTION N/A 02/23/2019   Procedure:  CORONARY STENT INTERVENTION;  Surgeon: [place],  M, MD;  Location: [place] [facility];  Service: Cardiovascular;  Laterality: N/A;  . ESOPHAGOGASTRODUODENOSCOPY    . KNEE ARTHROSCOPY WITH MEDIAL MENISECTOMY Left 05/20/2017   Procedure: LEFT KNEE ARTHROSCOPY WITH PARTIAL MEDIAL MENISCECTOMY;  Surgeon: Leandrew Koyanagi, MD;  Location: [place];  Service: Orthopedics;  Laterality: Left;  . LEFT HEART CATH AND CORONARY ANGIOGRAPHY N/A 02/23/2019   Procedure: LEFT HEART CATH AND CORONARY ANGIOGRAPHY;  Surgeon: [place],  M, MD;  Location: [place] [facility];  Service: Cardiovascular;  Laterality: N/A;  . LUMBAR SPINE SURGERY     x 2  . NASAL SINUS SURGERY    . PECTORALIS FLAP N/A 04/04/2019   Procedure: right pectoralis muscle flap for sternal wound reconstruction and VAC placement;  Surgeon: Wallace Going, DO;  Location: [place];  Service: Plastics;  Laterality: N/A;  2.5 hours case length  . REPAIR OF ACUTE ASCENDING THORACIC AORTIC DISSECTION N/A 02/23/2019   Procedure: REPAIR OF TYPE A  - ACUTE ASCENDING THORACIC AORTIC DISSECTION, using Hemashield Platinum Woven Double Velour Vascular Graft (D: 67m, L: 30cm);  Surgeon: VPrescott Gum PCollier Salina MD;  [place]  Service: Vascular;  Laterality: N/A;  . STERNAL WOUND DEBRIDEMENT N/A 03/22/2019   Procedure: STERNAL WOUND DEBRIDEMENT;  Surgeon: VIvin Poot MD;  [place] [facility]  Service: Thoracic;  Laterality: N/A;  . STERNAL WOUND DEBRIDEMENT N/A 03/25/2019   Procedure: SUPERFICIAL STERNAL WOUND DEBRIDEMENT;  Surgeon: VIvin Poot MD;  [place]  Service: Thoracic;  Laterality: N/A;  . STERNAL WOUND DEBRIDEMENT N/A 03/29/2019   Procedure: SUPERFICIAL STERNAL WOUND DEBRIDEMENT;  Surgeon: VIvin Poot MD;  [place]  Service: Thoracic;  Laterality: N/A;  . STERNAL WOUND DEBRIDEMENT N/A 03/31/2019   Procedure: STERNAL WOUND DEBRIDEMENT USING 1G ACEL POWDER;  Surgeon: VIvin Poot MD;  [place]   Service: Thoracic;  Laterality: N/A;  . STERNAL WOUND DEBRIDEMENT N/A 04/04/2019   Procedure: STERNAL WOUND DEBRIDEMENT;  Surgeon: VIvin Poot MD;  [facility] [facility] [facility]  Service: Thoracic;  Laterality: N/A;  . TEE WITHOUT CARDIOVERSION  05/28/2012   Procedure: TRANSESOPHAGEAL ECHOCARDIOGRAM (TEE);  Surgeon: BLelon Perla MD;  [facility]  Service: Cardiovascular;  Laterality: N/A;  . TEE WITHOUT CARDIOVERSION N/A 02/23/2019   Procedure: TRANSESOPHAGEAL ECHOCARDIOGRAM (TEE);  Surgeon: VPrescott Gum PCollier Salina MD;  [place]  Service: Open Heart Surgery;  Laterality: N/A;    Family History  Problem Relation Age of Onset  . Liver disease Mother   . Liver cancer Mother   . Breast cancer Mother   . Cancer Mother   . Hypertension Mother   . Heart attack Mother   . Cancer Father   . Heart disease Father  before age 54  . Hyperlipidemia Father   . Hypertension Father   . Heart attack Father   . Heart disease Other        multiple family members on maternal and paternal side of family  . Diabetes Sister   . Hyperlipidemia Sister   . Hypertension Sister   . Diabetes Paternal Grandmother   . Hypertension Brother   . Colon cancer Neg Hx     Social History Social History   Tobacco Use  . Smoking status: Former Smoker    Packs/day: 1.00    Years: 4.00    Pack years: 4.00    Types: Cigarettes    Quit date: 12/22/2018    Years since quitting: 0.5  . Smokeless tobacco: Never Used  Substance Use Topics  . Alcohol use: No    Alcohol/week: 0.0 standard drinks    Comment: no alcohol in 75yr   . Drug use: No    Current Outpatient Medications  Medication Sig Dispense Refill  . albuterol (PROVENTIL HFA;VENTOLIN HFA) 108 (90 Base) MCG/ACT inhaler Inhale 1 puff into the lungs every 6 (six) hours as needed for wheezing or shortness of breath.     . allopurinol (ZYLOPRIM) 100 MG tablet Take 100 mg by mouth daily.     .Marland KitchenamLODipine (NORVASC) 10 MG tablet Take 1 tablet  (10 mg total) by mouth daily for 30 days. 30 tablet 0  . aspirin EC 81 MG EC tablet Take 1 tablet (81 mg total) by mouth daily.    .Marland Kitchenatorvastatin (LIPITOR) 80 MG tablet Take 1 tablet (80 mg total) by mouth daily at 6 PM. 30 tablet 2  . carvedilol (COREG) 25 MG tablet Take 0.5 tablets (12.5 mg total) by mouth 2 (two) times daily with a meal for 30 days. 60 tablet 3  . esomeprazole (NEXIUM) 40 MG capsule Take 40 mg by mouth 2 (two) times daily.    . ferrous sulfate 325 (65 FE) MG EC tablet Take 1 tablet by mouth twice daily 60 tablet 0  . furosemide (LASIX) 40 MG tablet Take 1 tablet (40 mg total) by mouth daily. 30 tablet 2  . ipratropium-albuterol (DUONEB) 0.5-2.5 (3) MG/3ML SOLN Take 3 mLs by nebulization every 8 (eight) hours as needed (wheezing, shortness of breath). 360 mL 0  . losartan (COZAAR) 100 MG tablet Take 1 tablet (100 mg total) by mouth daily. 30 tablet 2  . Multiple Vitamin (MULITIVITAMIN WITH MINERALS) TABS Take 1 tablet by mouth daily.    . nitroGLYCERIN (NITROSTAT) 0.4 MG SL tablet Place 0.4 mg under the tongue every 5 (five) minutes as needed. For chest pain    . Omega-3 Fatty Acids (FISH OIL) 1200 MG CPDR Take 1,200 mg by mouth daily.    .Marland Kitchenspironolactone (ALDACTONE) 25 MG tablet Take 0.5 tablets (12.5 mg total) by mouth daily. 30 tablet 2  . traZODone (DESYREL) 100 MG tablet Take 100 mg by mouth at bedtime.     . metoprolol tartrate (LOPRESSOR) 50 MG tablet Take 1 tablet (50 mg total) by mouth 2 (two) times daily. 60 tablet 1   No current facility-administered medications for this visit.     Allergies  Allergen Reactions  . Penicillins Hives, Rash and Other (See Comments)    Did it involve swelling of the face/tongue/throat, SOB, or low BP? Yes Did it involve sudden or severe rash/hives, skin peeling, or any reaction on the inside of your mouth or nose? Yes  Did you need to seek  medical attention at a hospital or doctor's office? Yes When did it last happen?As a  child. If all above answers are "NO", may proceed with cephalosporin use.     Review of Systems no new complaints   BP (!) 159/79   Pulse 83   Temp 97.8 F (36.6 C) (Skin)   Resp 20   Ht 5' 8"  (1.727 m)   Wt 232 lb (105.2 kg)   SpO2 95% Comment: RA  BMI 35.28 kg/m  Physical Exam      Exam    General- alert and comfortable    Neck- no JVD, no cervical adenopathy palpable, no carotid bruit   Lungs- clear without rales, wheezes.  Sternal muscle flap incision well-healed.   Cor- regular rate and rhythm, no murmur , gallop   Abdomen- soft, non-tender   Extremities - warm, non-tender, minimal edema   Neuro- oriented, appropriate, no focal weakness   Diagnostic Tests: None  Impression: Patient needs improved blood pressure control because of his thoracic aortic disease and history of aortic dissection repair.  Plan: Stop carvedilol and start metoprolol tartrate 50 mg p.o. twice daily.  Continue with home blood pressure monitoring.   Len Childs, MD [facility] [phone]

## 2019-06-28 ENCOUNTER — Other Ambulatory Visit: Payer: Self-pay

## 2019-06-28 MED ORDER — METOPROLOL TARTRATE 50 MG PO TABS: 50.0000 mg | ORAL_TABLET | Freq: Two times a day (BID) | ORAL | 1 refills | Status: DC

## 2019-07-02 ENCOUNTER — Other Ambulatory Visit: Payer: Self-pay | Admitting: Cardiothoracic Surgery

## 2019-07-13 ENCOUNTER — Encounter (HOSPITAL_COMMUNITY)
Admission: RE | Admit: 2019-07-13 | Discharge: 2019-07-13 | Disposition: A | Discharge status: Home / Self Care | Payer: Medicare Other | Source: Ambulatory Visit | Attending: Cardiology | Admitting: Cardiology

## 2019-07-13 ENCOUNTER — Other Ambulatory Visit: Payer: Self-pay

## 2019-07-13 ENCOUNTER — Encounter (HOSPITAL_COMMUNITY): Payer: Self-pay

## 2019-07-13 VITALS — BP 158/92 | HR 72 | Temp 98.4°F | Ht 72.0 in | Wt 238.5 lb

## 2019-07-13 DIAGNOSIS — Z951 Presence of aortocoronary bypass graft: Secondary | ICD-10-CM | POA: Diagnosis not present

## 2019-07-13 NOTE — Progress Notes (Signed)
Cardiac Individual Treatment Plan  Patient Details  Name: [name] [name] MRN: [id number] Date of Birth: [birth year] Referring Provider:     CARDIAC REHAB PHASE II ORIENTATION from 07/13/2019 in [place]  Referring Provider  [name]      Initial Encounter Date:    CARDIAC REHAB PHASE II ORIENTATION from 07/13/2019 in [place]  Date  07/13/19      Visit Diagnosis: S/P CABGx1  Patient's Home Medications on Admission:  Current Outpatient Medications:  .  albuterol (PROVENTIL HFA;VENTOLIN HFA) 108 (90 Base) MCG/ACT inhaler, Inhale 1 puff into the lungs every 6 (six) hours as needed for wheezing or shortness of breath. , Disp: , Rfl:  .  allopurinol (ZYLOPRIM) 100 MG tablet, Take 100 mg by mouth daily. , Disp: , Rfl:  .  amLODipine (NORVASC) 10 MG tablet, Take 1 tablet (10 mg total) by mouth daily for 30 days., Disp: 30 tablet, Rfl: 0 .  aspirin EC 81 MG EC tablet, Take 1 tablet (81 mg total) by mouth daily., Disp: , Rfl:  .  atorvastatin (LIPITOR) 80 MG tablet, Take 1 tablet (80 mg total) by mouth daily at 6 PM., Disp: 30 tablet, Rfl: 2 .  carvedilol (COREG) 25 MG tablet, Take 0.5 tablets (12.5 mg total) by mouth 2 (two) times daily with a meal for 30 days., Disp: 60 tablet, Rfl: 3 .  esomeprazole (NEXIUM) 40 MG capsule, Take 40 mg by mouth 2 (two) times daily., Disp: , Rfl:  .  ferrous sulfate 325 (65 FE) MG EC tablet, Take 1 tablet by mouth twice daily, Disp: 60 tablet, Rfl: 0 .  furosemide (LASIX) 40 MG tablet, Take 1 tablet (40 mg total) by mouth daily., Disp: 30 tablet, Rfl: 2 .  HYDROcodone-acetaminophen (NORCO/VICODIN) 5-325 MG tablet, Take 1 tablet by mouth 2 (two) times daily as needed., Disp: , Rfl:  .  ipratropium-albuterol (DUONEB) 0.5-2.5 (3) MG/3ML SOLN, Take 3 mLs by nebulization every 8 (eight) hours as needed (wheezing, shortness of breath)., Disp: 360 mL, Rfl: 0 .  losartan (COZAAR) 100 MG tablet, Take 1 tablet (100 mg  total) by mouth daily., Disp: 30 tablet, Rfl: 2 .  metoprolol tartrate (LOPRESSOR) 50 MG tablet, Take 1 tablet (50 mg total) by mouth 2 (two) times daily., Disp: 60 tablet, Rfl: 1 .  Multiple Vitamin (MULITIVITAMIN WITH MINERALS) TABS, Take 1 tablet by mouth daily., Disp: , Rfl:  .  naproxen (NAPROSYN) 500 MG tablet, Take 500 mg by mouth 2 (two) times daily., Disp: , Rfl:  .  nitroGLYCERIN (NITROSTAT) 0.4 MG SL tablet, Place 0.4 mg under the tongue every 5 (five) minutes as needed. For chest pain, Disp: , Rfl:  .  olmesartan (BENICAR) 5 MG tablet, Take 5 mg by mouth daily., Disp: , Rfl:  .  Omega-3 Fatty Acids (FISH OIL) 1200 MG CPDR, Take 1,200 mg by mouth daily., Disp: , Rfl:  .  spironolactone (ALDACTONE) 25 MG tablet, Take 0.5 tablets (12.5 mg total) by mouth daily., Disp: 30 tablet, Rfl: 2 .  traZODone (DESYREL) 100 MG tablet, Take 100 mg by mouth at bedtime. , Disp: , Rfl:   Past Medical History: Past Medical History:  Diagnosis Date  . Adenomatous colon polyp 2009  . Alcohol abuse    abstinent since 1992  . Arthritis    back  . Asthma   . Barrett's esophagus 05/22/2015  . Chronic back pain   . COPD (chronic obstructive pulmonary disease) ([place])   .  Coronary artery disease    a. cath in 2010 showing 20% distal LM, 70-80% Ost LAD, 30% OM, and 40% RCA b. low-risk NST in 2014 and 2016 c. 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 Underwent repair of dissection and SVG-RCA.   Marland Kitchen GERD (gastroesophageal reflux disease)    takes Nexium daily  . Gout    takes ALlopurinol daily  . Hemorrhoids   . History of bronchitis 2015  . History of kidney stones   . Hyperlipidemia    was given a script a month ago but scared to take it.Medical Md is aware  . Hypertension    takes Losartan,Metoprolol,and Amlodipine daily  . Joint pain   . Lymphocytic colitis 2009  . Myocardial infarction [facility]) 2010    "light" (admit for CP 11/2009; ruled out for MI but had 70-80% ostail LAD stenosis and treated medically following NL perfusion study)  . NASH (nonalcoholic steatohepatitis)   . Peripheral vascular disease ([place])   . Personal history of colonic polyps 07/20/2002   Diminutive adenomas (3) 06/2002 diminutive adenoma (1) 2011   . Pre-diabetes   . Stroke ([place])   . TIA (transient ischemic attack)   . Urinary urgency     Tobacco Use: Social History   Tobacco Use  Smoking Status Former Smoker  . Packs/day: 1.00  . Years: 4.00  . Pack years: 4.00  . Types: Cigarettes  . Quit date: 12/22/2018  . Years since quitting: 0.5  Smokeless Tobacco Never Used    Labs: Recent Review Flowsheet Data    Labs for ITP Cardiac and Pulmonary Rehab Latest Ref Rng & Units 02/28/2019 03/01/2019 03/02/2019 03/18/2019 04/05/2019   Cholestrol 0 - 200 mg/dL - - - - -   LDLCALC 0 - 99 mg/dL - - - - -   HDL >40 mg/dL - - - - -   Trlycerides <150 mg/dL - - - - -   Hemoglobin A1c 4.8 - 5.6 % - - - - -   PHART 7.350 - 7.450 - - - 7.451(H) 7.517(H)   PCO2ART 32.0 - 48.0 mmHg - - - 35.0 38.5   HCO3 20.0 - 28.0 mmol/L - - - 24.1 31.3(H)   TCO2 22 - 32 mmol/L - - - 25 32   ACIDBASEDEF 0.0 - 2.0 mmol/L - - - - -   O2SAT % 54.3 57.9 54.9 91.0 97.0      Capillary Blood Glucose: Lab Results  Component Value Date   GLUCAP 111 (H) 03/05/2019   GLUCAP 107 (H) 03/05/2019   GLUCAP 101 (H) 03/04/2019   GLUCAP 124 (H) 03/04/2019   GLUCAP 105 (H) 03/04/2019     Exercise Target Goals: Exercise Program Goal: Individual exercise prescription set using results from initial 6 min walk test and THRR while considering  patient's activity barriers and safety.   Exercise Prescription Goal: Starting with aerobic activity 30 plus minutes a day, 3 days per week for initial exercise prescription. Provide home exercise prescription and guidelines that participant acknowledges understanding prior to discharge.  Activity Barriers &  Risk Stratification: Activity Barriers & Cardiac Risk Stratification - 07/13/19 1432      Activity Barriers & Cardiac Risk Stratification   Activity Barriers  Back Problems;Joint Problems;Shortness of Breath;Balance Concerns   shoulder, walks with a cane   Cardiac Risk Stratification  High       6 Minute Walk: 6 Minute Walk  Sigurd Name 07/13/19 1431         6 Minute Walk   Phase  Initial     Distance  400 feet     Walk Time  2 minutes had to stop     # of Rest Breaks  0     MPH  0.75     METS  1.58     RPE  13     Perceived Dyspnea   13     VO2 Peak  5.69     Symptoms  Yes (comment)     Comments  5/10 lower back pain and shortness of breath     Resting HR  72 bpm     Resting BP  158/92     Resting Oxygen Saturation   97 %     Exercise Oxygen Saturation  during 6 min walk  97 %     Max Ex. HR  94 bpm     Max Ex. BP  160/70     2 Minute Post BP  140/72        Oxygen Initial Assessment:   Oxygen Re-Evaluation:   Oxygen Discharge (Final Oxygen Re-Evaluation):   Initial Exercise Prescription: Initial Exercise Prescription - 07/13/19 1400      Date of Initial Exercise RX and Referring Provider   Date  07/13/19    Referring Provider  [name]    Expected Discharge Date  10/13/19      Treadmill   MPH  1.4    Grade  0    Minutes  17    METs  2.07      Arm Ergometer   Level  1.5    Watts  14    RPM  20    Minutes  17    METs  1.9      Prescription Details   Frequency (times per week)  3    Duration  Progress to 30 minutes of continuous aerobic without signs/symptoms of physical distress      Intensity   THRR 40-80% of Max Heartrate  [id number]    Ratings of Perceived Exertion  11-13    Perceived Dyspnea  0-4      Progression   Progression  Continue to progress workloads to maintain intensity without signs/symptoms of physical distress.      Resistance Training   Training Prescription  Yes    Weight  1    Reps  10-15       Perform Capillary  Blood Glucose checks as needed.  Exercise Prescription Changes:   Exercise Comments:   Exercise Goals and Review:  Exercise Goals    Row Name 07/13/19 1434             Exercise Goals   Increase Physical Activity  Yes       Intervention  Develop an individualized exercise prescription for aerobic and resistive training based on initial evaluation findings, risk stratification, comorbidities and participant's personal goals.       Expected Outcomes  Short Term: Attend rehab on a regular basis to increase amount of physical activity.;Long Term: Add in home exercise to make exercise part of routine and to increase amount of physical activity.;Long Term: Exercising regularly at least 3-5 days a week.       Increase Strength and Stamina  Yes       Intervention  Develop an individualized exercise prescription for aerobic and resistive training based on initial evaluation findings, risk stratification, comorbidities and  participant's personal goals.       Expected Outcomes  Short Term: Increase workloads from initial exercise prescription for resistance, speed, and METs.;Short Term: Perform resistance training exercises routinely during rehab and add in resistance training at home;Long Term: Improve cardiorespiratory fitness, muscular endurance and strength as measured by increased METs and functional capacity (6MWT)       Able to understand and use rate of perceived exertion (RPE) scale  Yes       Intervention  Provide education and explanation on how to use RPE scale       Expected Outcomes  Short Term: Able to use RPE daily in rehab to express subjective intensity level;Long Term:  Able to use RPE to guide intensity level when exercising independently       Able to understand and use Dyspnea scale  Yes       Intervention  Provide education and explanation on how to use Dyspnea scale       Expected Outcomes  Short Term: Able to use Dyspnea scale daily in rehab to express subjective sense of  shortness of breath during exertion;Long Term: Able to use Dyspnea scale to guide intensity level when exercising independently       Knowledge and understanding of Target Heart Rate Range (THRR)  Yes       Intervention  Provide education and explanation of THRR including how the numbers were predicted and where they are located for reference       Expected Outcomes  Short Term: Able to state/look up THRR;Long Term: Able to use THRR to govern intensity when exercising independently;Short Term: Able to use daily as guideline for intensity in rehab       Able to check pulse independently  Yes       Intervention  Provide education and demonstration on how to check pulse in carotid and radial arteries.;Review the importance of being able to check your own pulse for safety during independent exercise       Expected Outcomes  Short Term: Able to explain why pulse checking is important during independent exercise;Long Term: Able to check pulse independently and accurately       Understanding of Exercise Prescription  Yes       Intervention  Provide education, explanation, and written materials on patient's individual exercise prescription       Expected Outcomes  Short Term: Able to explain program exercise prescription;Long Term: Able to explain home exercise prescription to exercise independently          Exercise Goals Re-Evaluation :    Discharge Exercise Prescription (Final Exercise Prescription Changes):   Nutrition:  Target Goals: Understanding of nutrition guidelines, daily intake of sodium <1514m, cholesterol <2053m calories 30% from fat and 7% or less from saturated fats, daily to have 5 or more servings of fruits and vegetables.  Biometrics: Pre Biometrics - 07/13/19 1435      Pre Biometrics   Height  6' (1.829 m)    Waist Circumference  47.5 inches    Hip Circumference  46 inches    Waist to Hip Ratio  1.03 %    BMI (Calculated)  32.34    Triceps Skinfold  14 mm    % Body Fat   33.7 %    Grip Strength  8 kg    Flexibility  0 in   lower back surgery   Single Leg Stand  0 seconds        Nutrition Therapy Plan and Nutrition  Goals:   Nutrition Assessments: Nutrition Assessments - 07/13/19 1516      Rate Your Plate Scores   Pre Score  78       Nutrition Goals Re-Evaluation:   Nutrition Goals Discharge (Final Nutrition Goals Re-Evaluation):   Psychosocial: Target Goals: Acknowledge presence or absence of significant depression and/or stress, maximize coping skills, provide positive support system. Participant is able to verbalize types and ability to use techniques and skills needed for reducing stress and depression.  Initial Review & Psychosocial Screening: Initial Psych Review & Screening - 07/13/19 1514      Initial Review   Current issues with  None Identified      Family Dynamics   Good Support System?  Yes      Barriers   Psychosocial barriers to participate in program  Psychosocial barriers identified (see note)      Screening Interventions   Interventions  Encouraged to exercise;Provide feedback about the scores to participant    Expected Outcomes  Short Term goal: Identification and review with participant of any Quality of Life or Depression concerns found by scoring the questionnaire.       Quality of Life Scores: Quality of Life - 07/13/19 1436      Quality of Life   Select  Quality of Life      Quality of Life Scores   Health/Function Pre  19.17 %    Socioeconomic Pre  24.83 %    Psych/Spiritual Pre  27.43 %    Family Pre  28.8 %    GLOBAL Pre  23.41 %      Scores of 19 and below usually indicate a poorer quality of life in these areas.  A difference of  2-3 points is a clinically meaningful difference.  A difference of 2-3 points in the total score of the Quality of Life Index has been associated with significant improvement in overall quality of life, self-image, physical symptoms, and general health in studies  assessing change in quality of life.  PHQ-9: Recent Review Flowsheet Data    Depression screen [facility] 2/9 07/13/2019   Decreased Interest 0   Down, Depressed, Hopeless 0   PHQ - 2 Score 0   Altered sleeping 0   Tired, decreased energy 1   Change in appetite 2   Feeling bad or failure about yourself  0   Trouble concentrating 0   Moving slowly or fidgety/restless 0   Suicidal thoughts 0   PHQ-9 Score 3   Difficult doing work/chores Not difficult at all     Interpretation of Total Score  Total Score Depression Severity:  1-4 = Minimal depression, 5-9 = Mild depression, 10-14 = Moderate depression, 15-19 = Moderately severe depression, 20-27 = Severe depression   Psychosocial Evaluation and Intervention: Psychosocial Evaluation - 07/13/19 1514      Psychosocial Evaluation & Interventions   Interventions  Encouraged to exercise with the program and follow exercise prescription;Stress management education;Relaxation education    Comments  Patient's initial QOL score was 23.41% and his PHQ-9 score was 3 with no psychosocial barriers identified.    Expected Outcomes  Patient will have no psychosocial barriers identified at discharge.    Continue Psychosocial Services   No Follow up required       Psychosocial Re-Evaluation:   Psychosocial Discharge (Final Psychosocial Re-Evaluation):   Vocational Rehabilitation: Provide vocational rehab assistance to qualifying candidates.   Vocational Rehab Evaluation & Intervention: Vocational Rehab - 07/13/19 6283  Initial Vocational Rehab Evaluation & Intervention   Assessment shows need for Vocational Rehabilitation  No      Vocational Rehab Re-Evaulation   Comments  Patient disabled.       Education: Education Goals: Education classes will be provided on a weekly basis, covering required topics. Participant will state understanding/return demonstration of topics presented.  Learning Barriers/Preferences: Learning  Barriers/Preferences - 07/13/19 1516      Learning Barriers/Preferences   Learning Barriers  None    Learning Preferences  Written Material       Education Topics: Hypertension, Hypertension Reduction -Define heart disease and high blood pressure. Discus how high blood pressure affects the body and ways to reduce high blood pressure.   Exercise and Your Heart -Discuss why it is important to exercise, the FITT principles of exercise, normal and abnormal responses to exercise, and how to exercise safely.   Angina -Discuss definition of angina, causes of angina, treatment of angina, and how to decrease risk of having angina.   Cardiac Medications -Review what the following cardiac medications are used for, how they affect the body, and side effects that may occur when taking the medications.  Medications include Aspirin, Beta blockers, calcium channel blockers, ACE Inhibitors, angiotensin receptor blockers, diuretics, digoxin, and antihyperlipidemics.   Congestive Heart Failure -Discuss the definition of CHF, how to live with CHF, the signs and symptoms of CHF, and how keep track of weight and sodium intake.   Heart Disease and Intimacy -Discus the effect sexual activity has on the heart, how changes occur during intimacy as we age, and safety during sexual activity.   Smoking Cessation / COPD -Discuss different methods to quit smoking, the health benefits of quitting smoking, and the definition of COPD.   Nutrition I: Fats -Discuss the types of cholesterol, what cholesterol does to the heart, and how cholesterol levels can be controlled.   Nutrition II: Labels -Discuss the different components of food labels and how to read food label   Heart Parts/Heart Disease and PAD -Discuss the anatomy of the heart, the pathway of blood circulation through the heart, and these are affected by heart disease.   Stress I: Signs and Symptoms -Discuss the causes of stress, how stress  may lead to anxiety and depression, and ways to limit stress.   Stress II: Relaxation -Discuss different types of relaxation techniques to limit stress.   Warning Signs of Stroke / TIA -Discuss definition of a stroke, what the signs and symptoms are of a stroke, and how to identify when someone is having stroke.   Knowledge Questionnaire Score: Knowledge Questionnaire Score - 07/13/19 1516      Knowledge Questionnaire Score   Pre Score  17/24       Core Components/Risk Factors/Patient Goals at Admission: Personal Goals and Risk Factors at Admission - 07/13/19 1517      Core Components/Risk Factors/Patient Goals on Admission    Weight Management  Obesity    Hypertension  Yes    Intervention  Provide education on lifestyle modifcations including regular physical activity/exercise, weight management, moderate sodium restriction and increased consumption of fresh fruit, vegetables, and low fat dairy, alcohol moderation, and smoking cessation.;Monitor prescription use compliance.    Expected Outcomes  Short Term: Continued assessment and intervention until BP is < 140/16m HG in hypertensive participants. < 130/870mHG in hypertensive participants with diabetes, heart failure or chronic kidney disease.;Long Term: Maintenance of blood pressure at goal levels.    Personal Goal Other  Yes  Personal Goal  Do activities without fatigue; be able [place] more [place] [employer].    Intervention  Patient will attend cardiac rehab 3 days/week and supplement with exercise at home 2 days/week.    Expected Outcomes  Patient will meet his personal goals.       Core Components/Risk Factors/Patient Goals Review:    Core Components/Risk Factors/Patient Goals at Discharge (Final Review):    ITP Comments:   Comments: Patient arrived for 1st visit/orientation/education at 1330. Patient was referred to CR by Carlyle Dolly  due to S/P CABGx1 (Z95.1). During orientation advised patient on arrival  and appointment times what to wear, what to do before, during and after exercise. Reviewed attendance and class policy. Talked about inclement weather and class consultation policy. Pt is scheduled to return Cardiac Rehab on 07/18/19 at 9:30. Pt was advised to come to class 15 minutes before class starts. Patient was also given instructions on meeting with the dietician and attending the Family Structure classes. Discussed RPE/Dpysnea scales. Discussed initial THR and how to find their radial and/or carotid pulse. Discussed the initial exercise prescription and how this effects their progress. Pt is eager to get started. Patient participated in warm up stretches followed by light weights and resistance bands. Patient was not able to complete 6 minute walk test. He walked for 2 minutes and had to stop due to lower back pain 5/10. He says the pain is chronic and stays at a 4 to 5/10. Pain remained a 5/10 through out the orientation. Patient was measured for the equipment. Discussed equipment safety with patient. Took patient pre-anthropometric measurements. Patient finished visit at 1515.

## 2019-07-13 NOTE — Progress Notes (Signed)
Cardiac/Pulmonary Rehab Medication Review by a Pharmacist  Does the patient  feel that his/her medications are working for him/her?  yes  Has the patient been experiencing any side effects to the medications prescribed?  yes  Does the patient measure his/her own blood pressure or blood glucose at home?  yes   Does the patient have any problems obtaining medications due to transportation or finances?   no  Understanding of regimen: good Understanding of indications: good Potential of compliance: excellent  Questions asked to Determine Patient Understanding of Medication Regimen:  1. What is the name of the medication?  2. What is the medication used for?  3. When should it be taken?  4. How much should be taken?  5. How will you take it?  6. What side effects should you report?  Understanding Defined as: Excellent: All questions above are correct Good: Questions 1-4 are correct Fair: Questions 1-2 are correct  Poor: 1 or none of the above questions are correct   Pharmacist comments: [name] [name] presented today for cardiac rehab. He recently underwent a CABG in February. He is a very friendly individual. He was familiar with all his medications and compliant. He is monitoring his BP as well as his weight. He is tolerating current regimen without any problems. Continue current meds.  Thanks for the opportunity to be part of this patients care,  Isac Sarna, BS [place], [place] Clinical Pharmacist Pager [phone] 07/13/2019 3:08 PM

## 2019-07-13 NOTE — Progress Notes (Signed)
Daily Session Note  Patient Details  Name: [name] [name] MRN: [id number] Date of Birth: 08-24-1961 Referring Provider:     CARDIAC REHAB PHASE II ORIENTATION from 07/13/2019 in [place]  Referring Provider  [name]      Encounter Date: 07/13/2019  Check In: Session Check In - 07/13/19 1330      Check-In   Supervising physician immediately available to respond to emergencies  See telemetry face sheet for immediately available MD    Location  [facility]    Staff Present  Russella Dar, MS, EP, [facility], Exercise Physiologist;Amanda Ballard, Exercise Physiologist;Kaylynn Chamblin Wynetta Emery, RN, BSN    Virtual Visit  No    Medication changes reported      No    Fall or balance concerns reported     No    Tobacco Cessation  No Change   Patient quit smoking in 2019.   Warm-up and Cool-down  Performed as group-led Higher education careers adviser Performed  Yes    VAD Patient?  No    PAD/SET Patient?  No      Pain Assessment   Currently in Pain?  Yes    Pain Score  5     Pain Location  Back    Pain Orientation  Lower    Pain Descriptors / Indicators  Constant;Aching    Pain Type  Chronic pain    Pain Radiating Towards  No radiation    Pain Onset  Other (comment)   Pain is constant.   Pain Frequency  Constant    Aggravating Factors   Walking    Pain Relieving Factors  Resting and he takes Hydrocodone 5 mg/325 mg BID prn    Effect of Pain on Daily Activities  Limits at times.    Multiple Pain Sites  No       Capillary Blood Glucose: No results found for this or any previous visit (from the past 24 hour(s)).    Social History   Tobacco Use  Smoking Status Former Smoker  . Packs/day: 1.00  . Years: 4.00  . Pack years: 4.00  . Types: Cigarettes  . Quit date: 12/22/2018  . Years since quitting: 0.5  Smokeless Tobacco Never Used    Goals Met:  No report of cardiac concerns or symptoms Strength training completed today  Goals Unmet:   Not Applicable  Comments: Patient's orientation visit today. Check out 1515.   Dr. Kate Sable is Medical Director for [facility] Cardiac and Pulmonary Rehab.

## 2019-07-14 ENCOUNTER — Encounter (HOSPITAL_COMMUNITY): Payer: Medicare Other

## 2019-07-18 ENCOUNTER — Encounter (HOSPITAL_COMMUNITY)
Admission: RE | Admit: 2019-07-18 | Discharge: 2019-07-18 | Disposition: A | Discharge status: Home / Self Care | Payer: Medicare Other | Source: Ambulatory Visit | Attending: Cardiology | Admitting: Cardiology

## 2019-07-18 ENCOUNTER — Other Ambulatory Visit: Payer: Self-pay

## 2019-07-18 DIAGNOSIS — Z951 Presence of aortocoronary bypass graft: Secondary | ICD-10-CM | POA: Diagnosis not present

## 2019-07-18 NOTE — Progress Notes (Signed)
Cardiac Individual Treatment Plan  Patient Details  Name: [name] [name] MRN: [id number] Date of Birth: January 26, 1961 Referring Provider:     CARDIAC REHAB PHASE II ORIENTATION from 07/13/2019 in [place]  Referring Provider  [name]      Initial Encounter Date:    CARDIAC REHAB PHASE II ORIENTATION from 07/13/2019 in [place]  Date  07/13/19      Visit Diagnosis: S/P CABG x 1   Patient's Home Medications on Admission:  Current Outpatient Medications:  .  albuterol (PROVENTIL HFA;VENTOLIN HFA) 108 (90 Base) MCG/ACT inhaler, Inhale 1 puff into the lungs every 6 (six) hours as needed for wheezing or shortness of breath. , Disp: , Rfl:  .  allopurinol (ZYLOPRIM) 100 MG tablet, Take 100 mg by mouth daily. , Disp: , Rfl:  .  amLODipine (NORVASC) 10 MG tablet, Take 1 tablet (10 mg total) by mouth daily for 30 days., Disp: 30 tablet, Rfl: 0 .  aspirin EC 81 MG EC tablet, Take 1 tablet (81 mg total) by mouth daily., Disp: , Rfl:  .  atorvastatin (LIPITOR) 80 MG tablet, Take 1 tablet (80 mg total) by mouth daily at 6 PM., Disp: 30 tablet, Rfl: 2 .  carvedilol (COREG) 25 MG tablet, Take 0.5 tablets (12.5 mg total) by mouth 2 (two) times daily with a meal for 30 days., Disp: 60 tablet, Rfl: 3 .  esomeprazole (NEXIUM) 40 MG capsule, Take 40 mg by mouth 2 (two) times daily., Disp: , Rfl:  .  ferrous sulfate 325 (65 FE) MG EC tablet, Take 1 tablet by mouth twice daily, Disp: 60 tablet, Rfl: 0 .  furosemide (LASIX) 40 MG tablet, Take 1 tablet (40 mg total) by mouth daily., Disp: 30 tablet, Rfl: 2 .  HYDROcodone-acetaminophen (NORCO/VICODIN) 5-325 MG tablet, Take 1 tablet by mouth 2 (two) times daily as needed., Disp: , Rfl:  .  ipratropium-albuterol (DUONEB) 0.5-2.5 (3) MG/3ML SOLN, Take 3 mLs by nebulization every 8 (eight) hours as needed (wheezing, shortness of breath)., Disp: 360 mL, Rfl: 0 .  losartan (COZAAR) 100 MG tablet, Take 1 tablet (100  mg total) by mouth daily., Disp: 30 tablet, Rfl: 2 .  metoprolol tartrate (LOPRESSOR) 50 MG tablet, Take 1 tablet (50 mg total) by mouth 2 (two) times daily., Disp: 60 tablet, Rfl: 1 .  Multiple Vitamin (MULITIVITAMIN WITH MINERALS) TABS, Take 1 tablet by mouth daily., Disp: , Rfl:  .  naproxen (NAPROSYN) 500 MG tablet, Take 500 mg by mouth 2 (two) times daily., Disp: , Rfl:  .  nitroGLYCERIN (NITROSTAT) 0.4 MG SL tablet, Place 0.4 mg under the tongue every 5 (five) minutes as needed. For chest pain, Disp: , Rfl:  .  olmesartan (BENICAR) 5 MG tablet, Take 5 mg by mouth daily., Disp: , Rfl:  .  Omega-3 Fatty Acids (FISH OIL) 1200 MG CPDR, Take 1,200 mg by mouth daily., Disp: , Rfl:  .  spironolactone (ALDACTONE) 25 MG tablet, Take 0.5 tablets (12.5 mg total) by mouth daily., Disp: 30 tablet, Rfl: 2 .  traZODone (DESYREL) 100 MG tablet, Take 100 mg by mouth at bedtime. , Disp: , Rfl:   Past Medical History: Past Medical History:  Diagnosis Date  . Adenomatous colon polyp 2009  . Alcohol abuse    abstinent since 1992  . Arthritis    back  . Asthma   . Barrett's esophagus 05/22/2015  . Chronic back pain   . COPD (chronic obstructive pulmonary disease) ([place])   .  Coronary artery disease    a. cath in 2010 showing 20% distal LM, 70-80% Ost LAD, 30% OM, and 40% RCA b. low-risk NST in 2014 and 2016 c. 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 Underwent repair of dissection and SVG-RCA.   Mar[place] Kitchen GERD (gastroesophageal reflux disease)    takes Nexium daily  . Gout    takes ALlopurinol daily  . Hemorrhoids   . History of bronchitis 2015  . History of kidney stones   . Hyperlipidemia    was given a script a month ago but scared to take it.Medical Md is aware  . Hypertension    takes Losartan,Metoprolol,and Amlodipine daily  . Joint pain   . Lymphocytic colitis 2009  . Myocardial infarction [facility])  2010   "light" (admit for CP 11/2009; ruled out for MI but had 70-80% ostail LAD stenosis and treated medically following NL perfusion study)  . NASH (nonalcoholic steatohepatitis)   . Peripheral vascular disease ([place])   . Personal history of colonic polyps 07/20/2002   Diminutive adenomas (3) 06/2002 diminutive adenoma (1) 2011   . Pre-diabetes   . Stroke ([place])   . TIA (transient ischemic attack)   . Urinary urgency     Tobacco Use: Social History   Tobacco Use  Smoking Status Former Smoker  . Packs/day: 1.00  . Years: 4.00  . Pack years: 4.00  . Types: Cigarettes  . Quit date: 12/22/2018  . Years since quitting: 0.5  Smokeless Tobacco Never Used    Labs: Recent Review Flowsheet Data    Labs for ITP Cardiac and Pulmonary Rehab Latest Ref Rng & Units 02/28/2019 03/01/2019 03/02/2019 03/18/2019 04/05/2019   Cholestrol 0 - 200 mg/dL - - - - -   LDLCALC 0 - 99 mg/dL - - - - -   HDL >40 mg/dL - - - - -   Trlycerides <150 mg/dL - - - - -   Hemoglobin A1c 4.8 - 5.6 % - - - - -   PHART 7.350 - 7.450 - - - 7.451(H) 7.517(H)   PCO2ART 32.0 - 48.0 mmHg - - - 35.0 38.5   HCO3 20.0 - 28.0 mmol/L - - - 24.1 31.3(H)   TCO2 22 - 32 mmol/L - - - 25 32   ACIDBASEDEF 0.0 - 2.0 mmol/L - - - - -   O2SAT % 54.3 57.9 54.9 91.0 97.0      Capillary Blood Glucose: Lab Results  Component Value Date   GLUCAP 111 (H) 03/05/2019   GLUCAP 107 (H) 03/05/2019   GLUCAP 101 (H) 03/04/2019   GLUCAP 124 (H) 03/04/2019   GLUCAP 105 (H) 03/04/2019     Exercise Target Goals: Exercise Program Goal: Individual exercise prescription set using results from initial 6 min walk test and THRR while considering  patient's activity barriers and safety.   Exercise Prescription Goal: Starting with aerobic activity 30 plus minutes a day, 3 days per week for initial exercise prescription. Provide home exercise prescription and guidelines that participant acknowledges understanding prior to discharge.  Activity Barriers  & Risk Stratification: Activity Barriers & Cardiac Risk Stratification - 07/13/19 1432      Activity Barriers & Cardiac Risk Stratification   Activity Barriers  Back Problems;Joint Problems;Shortness of Breath;Balance Concerns   shoulder, walks with a cane   Cardiac Risk Stratification  High       6 Minute Walk: 6 Minute Walk  [place] Name 07/13/19 1431         6 Minute Walk   Phase  Initial     Distance  400 feet     Walk Time  2 minutes had to stop     # of Rest Breaks  0     MPH  0.75     METS  1.58     RPE  13     Perceived Dyspnea   13     VO2 Peak  5.69     Symptoms  Yes (comment)     Comments  5/10 lower back pain and shortness of breath     Resting HR  72 bpm     Resting BP  158/92     Resting Oxygen Saturation   97 %     Exercise Oxygen Saturation  during 6 min walk  97 %     Max Ex. HR  94 bpm     Max Ex. BP  160/70     2 Minute Post BP  140/72        Oxygen Initial Assessment:   Oxygen Re-Evaluation:   Oxygen Discharge (Final Oxygen Re-Evaluation):   Initial Exercise Prescription: Initial Exercise Prescription - 07/13/19 1400      Date of Initial Exercise RX and Referring Provider   Date  07/13/19    Referring Provider  [name]    Expected Discharge Date  10/13/19      Treadmill   MPH  1.4    Grade  0    Minutes  17    METs  2.07      Arm Ergometer   Level  1.5    Watts  14    RPM  20    Minutes  17    METs  1.9      Prescription Details   Frequency (times per week)  3    Duration  Progress to 30 minutes of continuous aerobic without signs/symptoms of physical distress      Intensity   THRR 40-80% of Max Heartrate  [id number]    Ratings of Perceived Exertion  11-13    Perceived Dyspnea  0-4      Progression   Progression  Continue to progress workloads to maintain intensity without signs/symptoms of physical distress.      Resistance Training   Training Prescription  Yes    Weight  1    Reps  10-15       Perform Capillary  Blood Glucose checks as needed.  Exercise Prescription Changes:   Exercise Comments:  Exercise Comments    Row Name 07/18/19 1331           Exercise Comments  Pt. attended his first complete exercise session today. He tolerated the exercise well. We weren't sure how the Treadmill was going to go due to his history of back issues and surgeries but he was able to walk at 1.4 MPH for 17 minutes.          Exercise Goals and Review:  Exercise Goals    Row Name 07/13/19 1434             Exercise Goals   Increase Physical Activity  Yes       Intervention  Develop an individualized exercise prescription for aerobic and resistive training based on initial evaluation findings, risk stratification, comorbidities and participant's personal goals.       Expected Outcomes  Short Term: Attend rehab on a  regular basis to increase amount of physical activity.;Long Term: Add in home exercise to make exercise part of routine and to increase amount of physical activity.;Long Term: Exercising regularly at least 3-5 days a week.       Increase Strength and Stamina  Yes       Intervention  Develop an individualized exercise prescription for aerobic and resistive training based on initial evaluation findings, risk stratification, comorbidities and participant's personal goals.       Expected Outcomes  Short Term: Increase workloads from initial exercise prescription for resistance, speed, and METs.;Short Term: Perform resistance training exercises routinely during rehab and add in resistance training at home;Long Term: Improve cardiorespiratory fitness, muscular endurance and strength as measured by increased METs and functional capacity (6MWT)       Able to understand and use rate of perceived exertion (RPE) scale  Yes       Intervention  Provide education and explanation on how to use RPE scale       Expected Outcomes  Short Term: Able to use RPE daily in rehab to express subjective intensity level;Long  Term:  Able to use RPE to guide intensity level when exercising independently       Able to understand and use Dyspnea scale  Yes       Intervention  Provide education and explanation on how to use Dyspnea scale       Expected Outcomes  Short Term: Able to use Dyspnea scale daily in rehab to express subjective sense of shortness of breath during exertion;Long Term: Able to use Dyspnea scale to guide intensity level when exercising independently       Knowledge and understanding of Target Heart Rate Range (THRR)  Yes       Intervention  Provide education and explanation of THRR including how the numbers were predicted and where they are located for reference       Expected Outcomes  Short Term: Able to state/look up THRR;Long Term: Able to use THRR to govern intensity when exercising independently;Short Term: Able to use daily as guideline for intensity in rehab       Able to check pulse independently  Yes       Intervention  Provide education and demonstration on how to check pulse in carotid and radial arteries.;Review the importance of being able to check your own pulse for safety during independent exercise       Expected Outcomes  Short Term: Able to explain why pulse checking is important during independent exercise;Long Term: Able to check pulse independently and accurately       Understanding of Exercise Prescription  Yes       Intervention  Provide education, explanation, and written materials on patient's individual exercise prescription       Expected Outcomes  Short Term: Able to explain program exercise prescription;Long Term: Able to explain home exercise prescription to exercise independently          Exercise Goals Re-Evaluation : Exercise Goals Re-Evaluation    Row Name 07/18/19 1329             Exercise Goal Re-Evaluation   Exercise Goals Review  Increase Physical Activity;Increase Strength and Stamina;Able to understand and use rate of perceived exertion (RPE)  scale;Knowledge and understanding of Target Heart Rate Range (THRR);Able to check pulse independently;Understanding of Exercise Prescription       Comments  Today was Advay's first complete exercise session. He was able to follow the prescription and tolerate  all of the exercise.       Expected Outcomes  Short: increase activity level. Long: increase overall strength and stamina to be able to do more without getting winded.           Discharge Exercise Prescription (Final Exercise Prescription Changes):   Nutrition:  Target Goals: Understanding of nutrition guidelines, daily intake of sodium <1540m, cholesterol <2028m calories 30% from fat and 7% or less from saturated fats, daily to have 5 or more servings of fruits and vegetables.  Biometrics: Pre Biometrics - 07/13/19 1435      Pre Biometrics   Height  6' (1.829 m)    Waist Circumference  47.5 inches    Hip Circumference  46 inches    Waist to Hip Ratio  1.03 %    BMI (Calculated)  32.34    Triceps Skinfold  14 mm    % Body Fat  33.7 %    Grip Strength  8 kg    Flexibility  0 in   lower back surgery   Single Leg Stand  0 seconds        Nutrition Therapy Plan and Nutrition Goals:   Nutrition Assessments: Nutrition Assessments - 07/13/19 1516      Rate Your Plate Scores   Pre Score  78       Nutrition Goals Re-Evaluation:   Nutrition Goals Discharge (Final Nutrition Goals Re-Evaluation):   Psychosocial: Target Goals: Acknowledge presence or absence of significant depression and/or stress, maximize coping skills, provide positive support system. Participant is able to verbalize types and ability to use techniques and skills needed for reducing stress and depression.  Initial Review & Psychosocial Screening: Initial Psych Review & Screening - 07/13/19 1514      Initial Review   Current issues with  None Identified      Family Dynamics   Good Support System?  Yes      Barriers   Psychosocial barriers to  participate in program  Psychosocial barriers identified (see note)      Screening Interventions   Interventions  Encouraged to exercise;Provide feedback about the scores to participant    Expected Outcomes  Short Term goal: Identification and review with participant of any Quality of Life or Depression concerns found by scoring the questionnaire.       Quality of Life Scores: Quality of Life - 07/13/19 1436      Quality of Life   Select  Quality of Life      Quality of Life Scores   Health/Function Pre  19.17 %    Socioeconomic Pre  24.83 %    Psych/Spiritual Pre  27.43 %    Family Pre  28.8 %    GLOBAL Pre  23.41 %      Scores of 19 and below usually indicate a poorer quality of life in these areas.  A difference of  2-3 points is a clinically meaningful difference.  A difference of 2-3 points in the total score of the Quality of Life Index has been associated with significant improvement in overall quality of life, self-image, physical symptoms, and general health in studies assessing change in quality of life.  PHQ-9: Recent Review Flowsheet Data    Dep[facility] 07/13/2019   Decreased Interest 0   Down, Depressed, Hopeless 0   PHQ - 2 Score 0   Altered sleeping 0   Tired, decreased energy 1   Change in appetite 2   Feeling bad or failure  about yourself  0   Trouble concentrating 0   Moving slowly or fidgety/restless 0   Suicidal thoughts 0   PHQ-9 Score 3   Difficult doing work/chores Not difficult at all     Interpretation of Total Score  Total Score Depression Severity:  1-4 = Minimal depression, 5-9 = Mild depression, 10-14 = Moderate depression, 15-19 = Moderately severe depression, 20-27 = Severe depression   Psychosocial Evaluation and Intervention: Psychosocial Evaluation - 07/13/19 1514      Psychosocial Evaluation & Interventions   Interventions  Encouraged to exercise with the program and follow exercise prescription;Stress management  education;Relaxation education    Comments  Patient's initial QOL score was 23.41% and his PHQ-9 score was 3 with no psychosocial barriers identified.    Expected Outcomes  Patient will have no psychosocial barriers identified at discharge.    Continue Psychosocial Services   No Follow up required       Psychosocial Re-Evaluation:   Psychosocial Discharge (Final Psychosocial Re-Evaluation):   Vocational Rehabilitation: Provide vocational rehab assistance to qualifying candidates.   Vocational Rehab Evaluation & Intervention: Vocational Rehab - 07/13/19 1517      Initial Vocational Rehab Evaluation & Intervention   Assessment shows need for Vocational Rehabilitation  No      Vocational Rehab Re-Evaulation   Comments  Patient disabled.       Education: Education Goals: Education classes will be provided on a weekly basis, covering required topics. Participant will state understanding/return demonstration of topics presented.  Learning Barriers/Preferences: Learning Barriers/Preferences - 07/13/19 1516      Learning Barriers/Preferences   Learning Barriers  None    Learning Preferences  Written Material       Education Topics: Hypertension, Hypertension Reduction -Define heart disease and high blood pressure. Discus how high blood pressure affects the body and ways to reduce high blood pressure.   Exercise and Your Heart -Discuss why it is important to exercise, the FITT principles of exercise, normal and abnormal responses to exercise, and how to exercise safely.   Angina -Discuss definition of angina, causes of angina, treatment of angina, and how to decrease risk of having angina.   Cardiac Medications -Review what the following cardiac medications are used for, how they affect the body, and side effects that may occur when taking the medications.  Medications include Aspirin, Beta blockers, calcium channel blockers, ACE Inhibitors, angiotensin receptor blockers,  diuretics, digoxin, and antihyperlipidemics.   Congestive Heart Failure -Discuss the definition of CHF, how to live with CHF, the signs and symptoms of CHF, and how keep track of weight and sodium intake.   Heart Disease and Intimacy -Discus the effect sexual activity has on the heart, how changes occur during intimacy as we age, and safety during sexual activity.   Smoking Cessation / COPD -Discuss different methods to quit smoking, the health benefits of quitting smoking, and the definition of COPD.   Nutrition I: Fats -Discuss the types of cholesterol, what cholesterol does to the heart, and how cholesterol levels can be controlled.   Nutrition II: Labels -Discuss the different components of food labels and how to read food label   Heart Parts/Heart Disease and PAD -Discuss the anatomy of the heart, the pathway of blood circulation through the heart, and these are affected by heart disease.   Stress I: Signs and Symptoms -Discuss the causes of stress, how stress may lead to anxiety and depression, and ways to limit stress.   Stress II: Relaxation -Discuss different  types of relaxation techniques to limit stress.   Warning Signs of Stroke / TIA -Discuss definition of a stroke, what the signs and symptoms are of a stroke, and how to identify when someone is having stroke.   Knowledge Questionnaire Score: Knowledge Questionnaire Score - 07/13/19 1516      Knowledge Questionnaire Score   Pre Score  17/24       Core Components/Risk Factors/Patient Goals at Admission: Personal Goals and Risk Factors at Admission - 07/13/19 1517      Core Components/Risk Factors/Patient Goals on Admission    Weight Management  Obesity    Hypertension  Yes    Intervention  Provide education on lifestyle modifcations including regular physical activity/exercise, weight management, moderate sodium restriction and increased consumption of fresh fruit, vegetables, and low fat dairy,  alcohol moderation, and smoking cessation.;Monitor prescription use compliance.    Expected Outcomes  Short Term: Continued assessment and intervention until BP is < 140/70m HG in hypertensive participants. < 130/848mHG in hypertensive participants with diabetes, heart failure or chronic kidney disease.;Long Term: Maintenance of blood pressure at goal levels.    Personal Goal Other  Yes    Personal Goal  Do activities without fatigue; [place] [employer]    Intervention  Patient will attend cardiac rehab 3 days/week and supplement with exercise at home 2 days/week.    Expected Outcomes  Patient will meet his personal goals.       Core Components/Risk Factors/Patient Goals Review:    Core Components/Risk Factors/Patient Goals at Discharge (Final Review):    ITP Comments: ITP Comments    Row Name 07/18/19 1518           ITP Comments  Patient new to program. He has completed 2 sessions. Will continue to monitor for progress.          Comments: ITP REVIEW Patient new to program. He has completed 2 sessions. Will continue to monitor for progress.

## 2019-07-18 NOTE — Progress Notes (Signed)
Daily Session Note  Patient Details  Name: [name] [name] MRN: [id number] Date of Birth: May 18, 1961 Referring Provider:     CARDIAC REHAB PHASE II ORIENTATION from 07/13/2019 in [place]  Referring Provider  [name]      Encounter Date: 07/18/2019  Check In: Session Check In - 07/18/19 0959      Check-In   Supervising physician immediately available to respond to emergencies  See telemetry face sheet for immediately available MD    Location  [facility]    Staff Present  Russella Dar, MS, EP, [facility], Exercise Physiologist;Debra Wynetta Emery, RN, [facility] Munch, Exercise Physiologist    Virtual Visit  No    Medication changes reported      No    Fall or balance concerns reported     No    Tobacco Cessation  No Change    Warm-up and Cool-down  Performed as group-led instruction    Resistance Training Performed  Yes    VAD Patient?  No    PAD/SET Patient?  No      Pain Assessment   Currently in Pain?  No/denies    Pain Score  0-No pain    Multiple Pain Sites  No       Capillary Blood Glucose: No results found for this or any previous visit (from the past 24 hour(s)).    Social History   Tobacco Use  Smoking Status Former Smoker  . Packs/day: 1.00  . Years: 4.00  . Pack years: 4.00  . Types: Cigarettes  . Quit date: 12/22/2018  . Years since quitting: 0.5  Smokeless Tobacco Never Used    Goals Met:  Independence with exercise equipment Exercise tolerated well Personal goals reviewed No report of cardiac concerns or symptoms Strength training completed today  Goals Unmet:  Not Applicable  Comments: Check out: 1030   Dr. Kate Sable is Medical Director for [facility] and Pulmonary Rehab.

## 2019-07-20 ENCOUNTER — Encounter (HOSPITAL_COMMUNITY)
Admission: RE | Admit: 2019-07-20 | Discharge: 2019-07-20 | Disposition: A | Discharge status: Home / Self Care | Payer: Medicare Other | Source: Ambulatory Visit | Attending: Cardiology | Admitting: Cardiology

## 2019-07-20 ENCOUNTER — Other Ambulatory Visit: Payer: Self-pay

## 2019-07-20 DIAGNOSIS — Z951 Presence of aortocoronary bypass graft: Secondary | ICD-10-CM

## 2019-07-20 NOTE — Progress Notes (Signed)
Daily Session Note  Patient Details  Name: [name] [name] MRN: [id number] Date of Birth: 08-30-1961 Referring Provider:     CARDIAC REHAB PHASE II ORIENTATION from 07/13/2019 in [place]  Referring Provider  [name]      Encounter Date: 07/20/2019  Check In: Session Check In - 07/20/19 0930      Check-In   Supervising physician immediately available to respond to emergencies  See telemetry face sheet for immediately available MD    Location  [facility]    Staff Present  Russella Dar, MS, EP, [facility], Exercise Physiologist;Pierre Dellarocco Wynetta Emery, RN, [facility] Munch, Exercise Physiologist    Virtual Visit  No    Medication changes reported      No    Fall or balance concerns reported     No    Tobacco Cessation  Use Decreased    Warm-up and Cool-down  Performed as group-led instruction    Resistance Training Performed  Yes    VAD Patient?  No    PAD/SET Patient?  No      Pain Assessment   Currently in Pain?  No/denies    Pain Score  0-No pain    Multiple Pain Sites  No       Capillary Blood Glucose: No results found for this or any previous visit (from the past 24 hour(s)).    Social History   Tobacco Use  Smoking Status Former Smoker  . Packs/day: 1.00  . Years: 4.00  . Pack years: 4.00  . Types: Cigarettes  . Quit date: 12/22/2018  . Years since quitting: 0.5  Smokeless Tobacco Never Used    Goals Met:  Independence with exercise equipment Exercise tolerated well No report of cardiac concerns or symptoms Strength training completed today  Goals Unmet:  Not Applicable  Comments: Pt able to follow exercise prescription today without complaint.  Will continue to monitor for progression. Check out 1030.   Dr. Kate Sable is Medical Director for [facility] Cardiac and Pulmonary Rehab.

## 2019-07-22 ENCOUNTER — Other Ambulatory Visit: Payer: Self-pay

## 2019-07-22 ENCOUNTER — Encounter: Payer: Self-pay | Admitting: Cardiology

## 2019-07-22 ENCOUNTER — Ambulatory Visit (INDEPENDENT_AMBULATORY_CARE_PROVIDER_SITE_OTHER): Payer: Medicare Other | Admitting: Cardiology

## 2019-07-22 ENCOUNTER — Encounter (HOSPITAL_COMMUNITY)
Admission: RE | Admit: 2019-07-22 | Discharge: 2019-07-22 | Disposition: A | Discharge status: Home / Self Care | Payer: Medicare Other | Source: Ambulatory Visit | Attending: Cardiology | Admitting: Cardiology

## 2019-07-22 VITALS — BP 152/78 | HR 65 | Temp 97.5°F | Ht 69.0 in | Wt 243.0 lb

## 2019-07-22 DIAGNOSIS — I3139 Other pericardial effusion (noninflammatory): Secondary | ICD-10-CM

## 2019-07-22 DIAGNOSIS — Z951 Presence of aortocoronary bypass graft: Secondary | ICD-10-CM | POA: Diagnosis not present

## 2019-07-22 DIAGNOSIS — I7101 Dissection of thoracic aorta: Secondary | ICD-10-CM

## 2019-07-22 DIAGNOSIS — I25119 Atherosclerotic heart disease of native coronary artery with unspecified angina pectoris: Secondary | ICD-10-CM

## 2019-07-22 DIAGNOSIS — I313 Pericardial effusion (noninflammatory): Secondary | ICD-10-CM

## 2019-07-22 DIAGNOSIS — I10 Essential (primary) hypertension: Secondary | ICD-10-CM

## 2019-07-22 DIAGNOSIS — E782 Mixed hyperlipidemia: Secondary | ICD-10-CM

## 2019-07-22 DIAGNOSIS — I71019 Dissection of thoracic aorta, unspecified: Secondary | ICD-10-CM

## 2019-07-22 MED ORDER — AMLODIPINE BESYLATE 5 MG PO TABS: 5.0000 mg | ORAL_TABLET | Freq: Every day | ORAL | 3 refills | Status: DC

## 2019-07-22 NOTE — Patient Instructions (Addendum)
Medication Instructions: STOP Benicar   START Norvasc 5 mg daily   Labwork: None  Procedures/Testing: None  Follow-Up:   3 months with Dr.McDowell  Any Additional Special Instructions Will Be Listed Below (If Applicable).     If you need a refill on your cardiac medications before your next appointment, please call your pharmacy.      Thank you for choosing [facility] !

## 2019-07-22 NOTE — Progress Notes (Signed)
Cardiology Office Note  Date: 07/22/2019   ID: Kaci, Freel 1961/12/25, MRN [id number]  PCP:  Shirline Frees, MD  Cardiologist:  Rozann Lesches, MD Electrophysiologist:  None   Chief Complaint  Patient presents with  . Cardiac follow-up    History of Present Illness: JAMILE SIVILS is a 58 y.o. male I saw for the first time in consultation back in February.  Most recent follow-up was via telehealth with Ms. Delano Metz in April.  He has had a fairly complex course over the last 6 months, I reviewed extensive records and updated the chart.  He presents today for a follow-up visit.  He states that he has been participating in cardiac rehabilitation, has had 3 sessions.  He does not report any angina or unusual breathlessness.  He does feel a slight "pulling" sensation when he exercises the muscles in his chest.  He has had no fevers or chills, no drainage or erythema around his sternal incision.  I reviewed his medications today.  He is on 2 different ARB's, Cozaar and Benicar.  We discussed stopping Benicar and replacing it with Norvasc.  I did review his home blood pressure checks, systolics have not been optimally controlled, 140s to 170s.  I reviewed his echocardiogram report from March as outlined below.  Past Medical History:  Diagnosis Date  . Arthritis   . Asthma   . Barrett's esophagus 05/22/2015  . Chronic back pain   . COPD (chronic obstructive pulmonary disease) ([place])   . Coronary artery disease    a. cath in 2010 showing 20% distal LM, 70-80% Ost LAD, 30% OM, and 40% RCA b. low-risk NST in 2014 and 2016 c. 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 Underwent repair of dissection and SVG-RCA.   Marland Kitchen GERD (gastroesophageal reflux disease)   . Gout   . Hemorrhoids   . History of alcohol abuse    Remote  . History of bronchitis 2015  . History of kidney  stones   . Hyperlipidemia   . Hypertension   . Lymphocytic colitis 2009  . Myocardial infarction ([place]) 2010   Admit for CP 11/2009; ruled out for MI but had 70-80% ostail LAD stenosis and treated medically following NL perfusion study)  . NASH (nonalcoholic steatohepatitis)   . Peripheral vascular disease ([place])   . Personal history of colonic polyps 07/20/2002   Diminutive adenomas (3) 06/2002 diminutive adenoma (1) 2011   . Pre-diabetes   . Stroke ([place])   . TIA (transient ischemic attack)   . Urinary urgency     Past Surgical History:  Procedure Laterality Date  . [place] VITRECTOMY WITH 20 GAUGE MVR PORT N/A 03/25/2019   Procedure: WOUND VAC CHANGE AND IRRIGATION;  Surgeon: Ivin Poot, MD;  Location: [place];  Service: Thoracic;  Laterality: N/A;  . ANEURYSM COILING  12/2010   cerebral  . APPLICATION OF WOUND VAC N/A 03/22/2019   Procedure: APPLICATION OF WOUND VAC;  Surgeon: Ivin Poot, MD;  Location: [place];  Service: Thoracic;  Laterality: N/A;  . APPLICATION OF WOUND VAC N/A 03/29/2019   Procedure: WOUND VAC CHANGE;  Surgeon: Ivin Poot, MD;  Location: [place];  Service: Thoracic;  Laterality: N/A;  . APPLICATION OF WOUND VAC N/A 03/31/2019   Procedure: WOUND VAC CHANGE;  Surgeon: Ivin Poot, MD;  Location: [place];  Service: Thoracic;  Laterality: N/A;  . BAND HEMORRHOIDECTOMY  2003   at sigmoidoscopy  . CARDIAC CATHETERIZATION  2010  . CHOLECYSTECTOMY  08/03/2015  . CHOLECYSTECTOMY N/A 08/03/2015   Procedure: LAPAROSCOPIC CHOLECYSTECTOMY WITH INTRAOPERATIVE CHOLANGIOGRAM;  Surgeon: Fanny Skates, MD;  Location: [place];  Service: General;  Laterality: N/A;  . COLONOSCOPY  multiple  . CORONARY ARTERY BYPASS GRAFT N/A 02/23/2019   Procedure: CORONARY ARTERY BYPASS GRAFTING (CABG) x 1; Using Endoscopically Harvested Right Leg Greater Saphenous Vein Graft (SVG); SVG to RCA;  Surgeon: Ivin Poot, MD;  Location: [place];  Service: Open Heart Surgery;   Laterality: N/A;  . CORONARY STENT INTERVENTION N/A 02/23/2019   Procedure: CORONARY STENT INTERVENTION;  Surgeon: Martinique, Peter M, MD;  Location: [place] CV [facility];  Service: Cardiovascular;  Laterality: N/A;  . ESOPHAGOGASTRODUODENOSCOPY    . KNEE ARTHROSCOPY WITH MEDIAL MENISECTOMY Left 05/20/2017   Procedure: LEFT KNEE ARTHROSCOPY WITH PARTIAL MEDIAL MENISCECTOMY;  Surgeon: Leandrew Koyanagi, MD;  Location: [place];  Service: Orthopedics;  Laterality: Left;  . LEFT HEART CATH AND CORONARY ANGIOGRAPHY N/A 02/23/2019   Procedure: LEFT HEART CATH AND CORONARY ANGIOGRAPHY;  Surgeon: Martinique, Peter M, MD;  Location: [place] [facility];  Service: Cardiovascular;  Laterality: N/A;  . LUMBAR SPINE SURGERY     x 2  . NASAL SINUS SURGERY    . PECTORALIS FLAP N/A 04/04/2019   Procedure: right pectoralis muscle flap for sternal wound reconstruction and VAC placement;  Surgeon: Wallace Going, DO;  Location: [place];  Service: Plastics;  Laterality: N/A;  2.5 hours case length  . REPAIR OF ACUTE ASCENDING THORACIC AORTIC DISSECTION N/A 02/23/2019   Procedure: REPAIR OF TYPE A  - ACUTE ASCENDING THORACIC AORTIC DISSECTION, using Hemashield Platinum Woven Double Velour Vascular Graft (D: 20m, L: 30cm);  Surgeon: VPrescott Gum PCollier Salina MD;  [place]  Service: Vascular;  Laterality: N/A;  . STERNAL WOUND DEBRIDEMENT N/A 03/22/2019   Procedure: STERNAL WOUND DEBRIDEMENT;  Surgeon: VIvin Poot MD;  [place]  Service: Thoracic;  Laterality: N/A;  . STERNAL WOUND DEBRIDEMENT N/A 03/25/2019   Procedure: SUPERFICIAL STERNAL WOUND DEBRIDEMENT;  Surgeon: VIvin Poot MD;  [place]  Service: Thoracic;  Laterality: N/A;  . STERNAL WOUND DEBRIDEMENT N/A 03/29/2019   Procedure: SUPERFICIAL STERNAL WOUND DEBRIDEMENT;  Surgeon: VIvin Poot MD;  [place]  Service: Thoracic;  Laterality: N/A;  . STERNAL WOUND DEBRIDEMENT N/A 03/31/2019   Procedure: STERNAL WOUND  DEBRIDEMENT USING 1G ACEL POWDER;  Surgeon: VIvin Poot MD;  [place]  Service: Thoracic;  Laterality: N/A;  . STERNAL WOUND DEBRIDEMENT N/A 04/04/2019   Procedure: STERNAL WOUND DEBRIDEMENT;  Surgeon: VIvin Poot MD;  [place]  Service: Thoracic;  Laterality: N/A;  . TEE WITHOUT CARDIOVERSION  05/28/2012   Procedure: TRANSESOPHAGEAL ECHOCARDIOGRAM (TEE);  Surgeon: BLelon Perla MD;  [facility];  Service: Cardiovascular;  Laterality: N/A;  . TEE WITHOUT CARDIOVERSION N/A 02/23/2019   Procedure: TRANSESOPHAGEAL ECHOCARDIOGRAM (TEE);  Surgeon: VPrescott Gum PCollier Salina MD;  [place]  Service: Open Heart Surgery;  Laterality: N/A;    Current Outpatient Medications  Medication Sig Dispense Refill  . albuterol (PROVENTIL HFA;VENTOLIN HFA) 108 (90 Base) MCG/ACT inhaler Inhale 1 puff into the lungs every 6 (six) hours as needed for wheezing or shortness of breath.     . allopurinol (ZYLOPRIM) 100 MG tablet Take 100 mg by mouth daily.     .Marland Kitchen  aspirin EC 81 MG EC tablet Take 1 tablet (81 mg total) by mouth daily.    Marland Kitchen atorvastatin (LIPITOR) 80 MG tablet Take 1 tablet (80 mg total) by mouth daily at 6 PM. 30 tablet 2  . esomeprazole (NEXIUM) 40 MG capsule Take 40 mg by mouth 2 (two) times daily.    Marland Kitchen HYDROcodone-acetaminophen (NORCO/VICODIN) 5-325 MG tablet Take 1 tablet by mouth 2 (two) times daily as needed.    Marland Kitchen ipratropium-albuterol (DUONEB) 0.5-2.5 (3) MG/3ML SOLN Take 3 mLs by nebulization every 8 (eight) hours as needed (wheezing, shortness of breath). 360 mL 0  . losartan (COZAAR) 100 MG tablet Take 1 tablet (100 mg total) by mouth daily. 30 tablet 2  . metoprolol tartrate (LOPRESSOR) 50 MG tablet Take 1 tablet (50 mg total) by mouth 2 (two) times daily. 60 tablet 1  . Multiple Vitamin (MULITIVITAMIN WITH MINERALS) TABS Take 1 tablet by mouth daily.    . naproxen (NAPROSYN) 500 MG tablet Take 500 mg by mouth 2 (two) times daily.    . nitroGLYCERIN (NITROSTAT)  0.4 MG SL tablet Place 0.4 mg under the tongue every 5 (five) minutes as needed. For chest pain    . Omega-3 Fatty Acids (FISH OIL) 1200 MG CPDR Take 1,200 mg by mouth daily.    Marland Kitchen spironolactone (ALDACTONE) 25 MG tablet Take 0.5 tablets (12.5 mg total) by mouth daily. 30 tablet 2  . traZODone (DESYREL) 100 MG tablet Take 100 mg by mouth at bedtime.     Marland Kitchen amLODipine (NORVASC) 5 MG tablet Take 1 tablet (5 mg total) by mouth daily. 90 tablet 3   No current facility-administered medications for this visit.    Allergies:  Penicillins   Social History: The patient  reports that he quit smoking about 6 months ago. His smoking use included cigarettes. He has a 4.00 pack-year smoking history. He has never used smokeless tobacco. He reports that he does not drink alcohol or use drugs.   ROS:  Please see the history of present illness. Otherwise, complete review of systems is positive for none.  All other systems are reviewed and negative.   Physical Exam: VS:  BP (!) 152/78   Pulse 65   Temp (!) 97.5 F (36.4 C)   Ht 5' 9"  (1.753 m)   Wt 243 lb (110.2 kg)   BMI 35.88 kg/m , BMI Body mass index is 35.88 kg/m.  Wt Readings from Last 3 Encounters:  07/22/19 243 lb (110.2 kg)  07/13/19 238 lb 8 oz (108.2 kg)  06/24/19 232 lb (105.2 kg)    General: Patient appears comfortable at rest.  Using a cane. HEENT: Conjunctiva and lids normal, wearing a mask. Neck: Supple, no elevated JVP or carotid bruits, no thyromegaly. Lungs: Clear to auscultation, nonlabored breathing at rest. Thorax: Well-healed sternal incision. Cardiac: Regular rate and rhythm, no S3, soft systolic murmur, no pericardial rub. Abdomen: Soft, nontender, bowel sounds present. Extremities: No pitting edema, distal pulses 2+. Skin: Warm and dry. Musculoskeletal: No kyphosis. Neuropsychiatric: Alert and oriented x3, affect grossly appropriate.  ECG:  An ECG dated 03/18/2019 was personally reviewed today and demonstrated:  Sinus  tachycardia with right bundle branch block and frequent PACs.  Recent Labwork: 02/27/2019: TSH 4.862 03/18/2019: B Natriuretic Peptide 280.2 03/28/2019: Magnesium 1.6 04/06/2019: ALT 17; AST 20; BUN 26; Creatinine, Ser 0.85; Potassium 3.9; Sodium 134 04/07/2019: Hemoglobin 8.6; Platelets 381     Component Value Date/Time   CHOL 156 02/23/2019 1314   TRIG 101  02/23/2019 1314   HDL 31 (L) 02/23/2019 1314   CHOLHDL 5.0 02/23/2019 1314   VLDL 20 02/23/2019 1314   LDLCALC 105 (H) 02/23/2019 1314    Other Studies Reviewed Today:  Echocardiogram 03/28/2019:  1. The left ventricle has low normal systolic function, with an ejection fraction of 50-55%. The cavity size was normal.  2. Left atrial size was moderately dilated.  3. Small pericardial effusion.  4. Small mostly apical pericardial effusion no signs of tamponade.  5. Mild thickening of the mitral valve leaflet. Mild calcification of the mitral valve leaflet.  6. Tricuspid valve regurgitation is mild-moderate.  7. The aortic valve is tricuspid Mild thickening of the aortic valve Mild calcification of the aortic valve. Aortic valve regurgitation is trivial by color flow Doppler.  8. The aortic root is normal in size and structure.  9. The interatrial septum was not assessed.  Cardiac catheterization and PCI 02/23/2019:  Dist LM to Ost LAD lesion is 45% stenosed.  Prox Cx to Mid Cx lesion is 30% stenosed.  Prox LAD lesion is 30% stenosed.  Prox RCA lesion is 90% stenosed.  Post intervention, there is a 0% residual stenosis.  A drug-eluting stent was successfully placed using a STENT SYNERGY DES 2.5X16.  A drug-eluting stent was successfully placed using a STENT SYNERGY DES 2.75X12.  The left ventricular systolic function is normal.  LV end diastolic pressure is normal.  The left ventricular ejection fraction is 55-65% by visual estimate.   1. Single vessel obstructive CAD involving the proximal RCA 2. Normal LV function 3.  Normal LVEDP 4. Successful stenting of the proximal RCA with DES x 2 5. 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   Plan: emergent Aortic CT angio. Dr. Prescott Gum to evaluate. Will hold Brilinta until status of dissection is clarified.   Assessment and Plan:  1.  CAD status post DES x2 to the proximal RCA in February of this year by Dr. Martinique.  Procedure was complicated by catheter trauma and ascending aortic dissection.  Patient is now status post SVG to RCA at the time of surgical repair of the dissection.  He is on aspirin and statin, not Plavix.  Continue cardiac rehabilitation.  2.  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  Postoperative course complicated by pericardial effusion and sternal wound dehiscence, now clinically stable after follow-up surgical management by TCTS and Plastics.  He had follow-up with Dr. Prescott Gum in June.  3.  Mixed hyperlipidemia, tolerating Lipitor.  He plans to follow-up FLP with PCP at upcoming visit.  4.  Essential hypertension, blood pressure is not optimally controlled.  He is on two ARB's as outlined above, we are stopping Benicar and will replace this with Norvasc 5 mg daily.  This can be further uptitrated as needed.  Medication Adjustments/Labs and Tests Ordered: Current medicines are reviewed at length with the patient today.  Concerns regarding medicines are outlined above.   Tests Ordered: No orders of the defined types were placed in this encounter.   Medication Changes: Meds ordered this encounter  Medications  . amLODipine (NORVASC) 5 MG tablet    Sig: Take 1 tablet (5 mg total) by mouth daily.    Dispense:  90 tablet    Refill:  3    Disposition:  Follow up 3 months in the [place] office.  Signed, Mikeal Hawthorne  Delman Kitten, MD, [facility] 07/22/2019 11:39 AM    [place] at [facility] [address] [place],  [address] Phone: [phone]; Fax: [phone]

## 2019-07-22 NOTE — Progress Notes (Signed)
Daily Session Note  Patient Details  Name: [name] [name] MRN: [id number] Date of Birth: [birth year] Referring Provider:     CARDIAC REHAB PHASE II ORIENTATION from 07/13/2019 in [place]  Referring Provider  [name]      Encounter Date: 07/22/2019  Check In: Session Check In - 07/22/19 0930      Check-In   Supervising physician immediately available to respond to emergencies  See telemetry face sheet for immediately available MD    Location  [facility]    Staff Present  Russella Dar, MS, EP, [facility], Exercise Physiologist;Brannon Levene Wynetta Emery, RN, Cory Munch, Exercise Physiologist    Virtual Visit  No    Medication changes reported      No    Fall or balance concerns reported     No    Tobacco Cessation  No Change    Warm-up and Cool-down  Performed as group-led instruction    Resistance Training Performed  Yes    VAD Patient?  No    PAD/SET Patient?  No      Pain Assessment   Currently in Pain?  No/denies    Pain Score  0-No pain    Multiple Pain Sites  No       Capillary Blood Glucose: No results found for this or any previous visit (from the past 24 hour(s)).    Social History   Tobacco Use  Smoking Status Former Smoker  . Packs/day: 1.00  . Years: 4.00  . Pack years: 4.00  . Types: Cigarettes  . Quit date: 12/22/2018  . Years since quitting: 0.5  Smokeless Tobacco Never Used    Goals Met:  Independence with exercise equipment Exercise tolerated well No report of cardiac concerns or symptoms Strength training completed today  Goals Unmet:  Not Applicable  Comments: Pt able to follow exercise prescription today without complaint.  Will continue to monitor for progression. Check out 1030.   Dr. Kate Sable is Medical Director for [facility] Cardiac and Pulmonary Rehab.

## 2019-07-25 ENCOUNTER — Encounter (HOSPITAL_COMMUNITY)
Admission: RE | Admit: 2019-07-25 | Discharge: 2019-07-25 | Disposition: A | Discharge status: Home / Self Care | Payer: Medicare Other | Source: Ambulatory Visit | Attending: Cardiology | Admitting: Cardiology

## 2019-07-25 ENCOUNTER — Other Ambulatory Visit: Payer: Self-pay

## 2019-07-25 DIAGNOSIS — Z951 Presence of aortocoronary bypass graft: Secondary | ICD-10-CM

## 2019-07-25 NOTE — Progress Notes (Signed)
Daily Session Note  Patient Details  Name: DEMARQUES PILZ MRN: [id number] Date of Birth: March 16, 1961 Referring Provider:     CARDIAC REHAB PHASE II ORIENTATION from 07/13/2019 in [place]  Referring Provider  [name]      Encounter Date: 07/25/2019  Check In: Session Check In - 07/25/19 0930      Check-In   Supervising physician immediately available to respond to emergencies  See telemetry face sheet for immediately available MD    Location  [facility]    Staff Present  Russella Dar, MS, EP, [facility], Exercise Physiologist;Virgil Slinger Zachery Conch, Exercise Physiologist    Virtual Visit  No    Medication changes reported      No    Fall or balance concerns reported     No    Tobacco Cessation  No Change    Warm-up and Cool-down  Performed as group-led instruction    Resistance Training Performed  Yes    VAD Patient?  No    PAD/SET Patient?  No      Pain Assessment   Currently in Pain?  No/denies    Pain Score  0-No pain    Multiple Pain Sites  No       Capillary Blood Glucose: No results found for this or any previous visit (from the past 24 hour(s)).    Social History   Tobacco Use  Smoking Status Former Smoker  . Packs/day: 1.00  . Years: 4.00  . Pack years: 4.00  . Types: Cigarettes  . Quit date: 12/22/2018  . Years since quitting: 0.5  Smokeless Tobacco Never Used    Goals Met:  Independence with exercise equipment Exercise tolerated well No report of cardiac concerns or symptoms Strength training completed today  Goals Unmet:  Not Applicable  Comments: Pt able to follow exercise prescription today without complaint.  Will continue to monitor for progression. Check out 1030.   Dr. Kate Sable is Medical Director for [facility] Cardiac and Pulmonary Rehab.

## 2019-07-27 ENCOUNTER — Encounter (HOSPITAL_COMMUNITY)
Admission: RE | Admit: 2019-07-27 | Discharge: 2019-07-27 | Disposition: A | Discharge status: Home / Self Care | Payer: Medicare Other | Source: Ambulatory Visit | Attending: Cardiology | Admitting: Cardiology

## 2019-07-27 ENCOUNTER — Other Ambulatory Visit: Payer: Self-pay

## 2019-07-27 DIAGNOSIS — Z951 Presence of aortocoronary bypass graft: Secondary | ICD-10-CM | POA: Diagnosis not present

## 2019-07-27 NOTE — Progress Notes (Signed)
Daily Session Note  Patient Details  Name: [name] [name] MRN: [id number] Date of Birth: 12-27-1961 Referring Provider:     CARDIAC REHAB PHASE II ORIENTATION from 07/13/2019 in [place]  Referring Provider  [name]      Encounter Date: 07/27/2019  Check In: Session Check In - 07/27/19 0930      Check-In   Supervising physician immediately available to respond to emergencies  See telemetry face sheet for immediately available MD    Location  [facility]    Staff Present  Russella Dar, MS, EP, [facility], Exercise Physiologist;Quintan Saldivar Zachery Conch, Exercise Physiologist    Virtual Visit  No    Medication changes reported      No    Fall or balance concerns reported     No    Tobacco Cessation  No Change    Warm-up and Cool-down  Performed as group-led instruction    Resistance Training Performed  Yes    VAD Patient?  No    PAD/SET Patient?  No      Pain Assessment   Currently in Pain?  No/denies    Pain Score  0-No pain    Multiple Pain Sites  No       Capillary Blood Glucose: No results found for this or any previous visit (from the past 24 hour(s)).    Social History   Tobacco Use  Smoking Status Former Smoker  . Packs/day: 1.00  . Years: 4.00  . Pack years: 4.00  . Types: Cigarettes  . Quit date: 12/22/2018  . Years since quitting: 0.5  Smokeless Tobacco Never Used    Goals Met:  Independence with exercise equipment Exercise tolerated well No report of cardiac concerns or symptoms Strength training completed today  Goals Unmet:  Not Applicable  Comments: Pt able to follow exercise prescription today without complaint.  Will continue to monitor for progression. Check out 1030.   Dr. Kate Sable is Medical Director for [facility] Cardiac and Pulmonary Rehab.

## 2019-07-29 ENCOUNTER — Encounter (HOSPITAL_COMMUNITY)
Admission: RE | Admit: 2019-07-29 | Discharge: 2019-07-29 | Disposition: A | Discharge status: Home / Self Care | Payer: Medicare Other | Source: Ambulatory Visit | Attending: Cardiology | Admitting: Cardiology

## 2019-07-29 ENCOUNTER — Other Ambulatory Visit: Payer: Self-pay

## 2019-07-29 DIAGNOSIS — Z951 Presence of aortocoronary bypass graft: Secondary | ICD-10-CM | POA: Diagnosis not present

## 2019-07-29 NOTE — Progress Notes (Signed)
Daily Session Note  Patient Details  Name: [name] [name] MRN: [id number] Date of Birth: 1961-08-09 Referring Provider:     CARDIAC REHAB PHASE II ORIENTATION from 07/13/2019 in [place]  Referring Provider  [name]      Encounter Date: 07/29/2019  Check In: Session Check In - 07/29/19 0930      Check-In   Supervising physician immediately available to respond to emergencies  See telemetry face sheet for immediately available MD    Location  [facility]    Staff Present  Russella Dar, MS, EP, [facility], Exercise Physiologist;Atziry Baranski Zachery Conch, Exercise Physiologist    Virtual Visit  No    Medication changes reported      No    Fall or balance concerns reported     Yes    Comments  walks with cane    Tobacco Cessation  No Change    Warm-up and Cool-down  Performed as group-led instruction    Resistance Training Performed  Yes    VAD Patient?  No    PAD/SET Patient?  No      Pain Assessment   Currently in Pain?  No/denies    Pain Score  0-No pain    Multiple Pain Sites  No       Capillary Blood Glucose: No results found for this or any previous visit (from the past 24 hour(s)).    Social History   Tobacco Use  Smoking Status Former Smoker  . Packs/day: 1.00  . Years: 4.00  . Pack years: 4.00  . Types: Cigarettes  . Quit date: 12/22/2018  . Years since quitting: 0.6  Smokeless Tobacco Never Used    Goals Met:  Independence with exercise equipment Exercise tolerated well No report of cardiac concerns or symptoms Strength training completed today  Goals Unmet:  Not Applicable  Comments: Pt able to follow exercise prescription today without complaint.  Will continue to monitor for progression. Check out 1030.   Dr. Kate Sable is Medical Director for [facility] Cardiac and Pulmonary Rehab.

## 2019-08-01 ENCOUNTER — Other Ambulatory Visit: Payer: Self-pay

## 2019-08-01 ENCOUNTER — Encounter (HOSPITAL_COMMUNITY)
Admission: RE | Admit: 2019-08-01 | Discharge: 2019-08-01 | Disposition: A | Discharge status: Home / Self Care | Payer: Medicare Other | Source: Ambulatory Visit | Attending: Cardiology | Admitting: Cardiology

## 2019-08-01 DIAGNOSIS — Z951 Presence of aortocoronary bypass graft: Secondary | ICD-10-CM | POA: Insufficient documentation

## 2019-08-01 NOTE — Progress Notes (Signed)
Daily Session Note  Patient Details  Name: [name] [name] MRN: [id number] Date of Birth: 05/23/1961 Referring Provider:     CARDIAC REHAB PHASE II ORIENTATION from 07/13/2019 in [place]  Referring Provider  [name]      Encounter Date: 08/01/2019  Check In: Session Check In - 08/01/19 0930      Check-In   Supervising physician immediately available to respond to emergencies  See telemetry face sheet for immediately available MD    Location  [facility]    Staff Present  Russella Dar, MS, EP, [facility], Exercise Physiologist;Jerryl Holzhauer Wynetta Emery, RN, [facility] Munch, Exercise Physiologist    Virtual Visit  No    Medication changes reported      No    Fall or balance concerns reported     Yes    Comments  walks with cane    Tobacco Cessation  No Change    Warm-up and Cool-down  Performed as group-led instruction    Resistance Training Performed  Yes    VAD Patient?  No    PAD/SET Patient?  No      Pain Assessment   Currently in Pain?  No/denies    Pain Score  0-No pain       Capillary Blood Glucose: No results found for this or any previous visit (from the past 24 hour(s)).    Social History   Tobacco Use  Smoking Status Former Smoker  . Packs/day: 1.00  . Years: 4.00  . Pack years: 4.00  . Types: Cigarettes  . Quit date: 12/22/2018  . Years since quitting: 0.6  Smokeless Tobacco Never Used    Goals Met:  Independence with exercise equipment Exercise tolerated well No report of cardiac concerns or symptoms Strength training completed today  Goals Unmet:  Not Applicable  Comments: Pt able to follow exercise prescription today without complaint.  Will continue to monitor for progression. Check out 1030.   Dr. Kate Sable is Medical Director for [facility] Cardiac and Pulmonary Rehab.

## 2019-08-03 ENCOUNTER — Other Ambulatory Visit: Payer: Self-pay

## 2019-08-03 ENCOUNTER — Encounter (HOSPITAL_COMMUNITY)
Admission: RE | Admit: 2019-08-03 | Discharge: 2019-08-03 | Disposition: A | Discharge status: Home / Self Care | Payer: Medicare Other | Source: Ambulatory Visit | Attending: Cardiology | Admitting: Cardiology

## 2019-08-03 ENCOUNTER — Encounter (HOSPITAL_COMMUNITY): Payer: Medicare Other

## 2019-08-03 DIAGNOSIS — Z951 Presence of aortocoronary bypass graft: Secondary | ICD-10-CM

## 2019-08-03 NOTE — Progress Notes (Signed)
Daily Session Note  Patient Details  Name: [name] [name] MRN: [id number] Date of Birth: 01-12-1961 Referring Provider:     CARDIAC REHAB PHASE II ORIENTATION from 07/13/2019 in [place]  Referring Provider  [name]      Encounter Date: 08/03/2019  Check In: Session Check In - 08/03/19 0930      Check-In   Supervising physician immediately available to respond to emergencies  See telemetry face sheet for immediately available MD    Location  [facility]    Staff Present  Russella Dar, MS, EP, [facility], Exercise Physiologist;Amanda Ballard, Exercise Physiologist;Kristiane Morsch Wynetta Emery, RN, BSN    Virtual Visit  No    Medication changes reported      No    Fall or balance concerns reported     Yes    Tobacco Cessation  No Change    Warm-up and Cool-down  Performed as group-led instruction    Resistance Training Performed  Yes    VAD Patient?  No    PAD/SET Patient?  No      Pain Assessment   Currently in Pain?  No/denies    Pain Score  0-No pain    Multiple Pain Sites  No       Capillary Blood Glucose: No results found for this or any previous visit (from the past 24 hour(s)).    Social History   Tobacco Use  Smoking Status Former Smoker  . Packs/day: 1.00  . Years: 4.00  . Pack years: 4.00  . Types: Cigarettes  . Quit date: 12/22/2018  . Years since quitting: 0.6  Smokeless Tobacco Never Used    Goals Met:  Independence with exercise equipment Exercise tolerated well No report of cardiac concerns or symptoms Strength training completed today  Goals Unmet:  Not Applicable  Comments: Pt able to follow exercise prescription today without complaint.  Will continue to monitor for progression. Check out 1030.   Dr. Kate Sable is Medical Director for [facility].

## 2019-08-05 ENCOUNTER — Encounter (HOSPITAL_COMMUNITY)
Admission: RE | Admit: 2019-08-05 | Discharge: 2019-08-05 | Disposition: A | Discharge status: Home / Self Care | Payer: Medicare Other | Source: Ambulatory Visit | Attending: Cardiology | Admitting: Cardiology

## 2019-08-05 ENCOUNTER — Other Ambulatory Visit: Payer: Self-pay

## 2019-08-05 DIAGNOSIS — Z951 Presence of aortocoronary bypass graft: Secondary | ICD-10-CM

## 2019-08-05 NOTE — Progress Notes (Signed)
Incomplete Session Note  Patient Details  Name: [name] [name] MRN: [id number] Date of Birth: 02-13-1961 Referring Provider:     CARDIAC REHAB PHASE II ORIENTATION from 07/13/2019 in [place]  Referring Provider  [name] did not complete his rehab session.  We did not allow patient to exercise because his pain was a 9/10. We instructed him to go home and rest and take his medication as prescribed and to call his MD if the pain persists. He verbalized understanding.

## 2019-08-08 ENCOUNTER — Other Ambulatory Visit: Payer: Self-pay

## 2019-08-08 ENCOUNTER — Encounter (HOSPITAL_COMMUNITY)
Admission: RE | Admit: 2019-08-08 | Discharge: 2019-08-08 | Disposition: A | Discharge status: Home / Self Care | Payer: Medicare Other | Source: Ambulatory Visit | Attending: Cardiology | Admitting: Cardiology

## 2019-08-08 DIAGNOSIS — Z951 Presence of aortocoronary bypass graft: Secondary | ICD-10-CM | POA: Diagnosis not present

## 2019-08-08 NOTE — Progress Notes (Signed)
Daily Session Note  Patient Details  Name: KRISHON ADKISON MRN: [id number] Date of Birth: 1961/12/24 Referring Provider:     CARDIAC REHAB PHASE II ORIENTATION from 07/13/2019 in [place]  Referring Provider  [name]      Encounter Date: 08/08/2019  Check In: Session Check In - 08/08/19 0956      Check-In   Supervising physician immediately available to respond to emergencies  See telemetry face sheet for immediately available MD    Location  [facility]    Staff Present  Russella Dar, MS, EP, [facility], Exercise Physiologist;Debra Wynetta Emery, RN, [facility], Exercise Physiologist    Virtual Visit  No    Medication changes reported      No    Fall or balance concerns reported     No    Tobacco Cessation  No Change    Warm-up and Cool-down  Performed as group-led instruction    Resistance Training Performed  Yes    VAD Patient?  No    PAD/SET Patient?  No      Pain Assessment   Currently in Pain?  Yes    Pain Score  0-No pain    Pain Location  Back    Pain Orientation  Lower    Pain Descriptors / Indicators  Contraction    Pain Type  Chronic pain    Pain Onset  More than a month ago    Pain Frequency  Constant    Aggravating Factors   Movement    Pain Relieving Factors  Rest    Multiple Pain Sites  No       Capillary Blood Glucose: No results found for this or any previous visit (from the past 24 hour(s)).    Social History   Tobacco Use  Smoking Status Former Smoker  . Packs/day: 1.00  . Years: 4.00  . Pack years: 4.00  . Types: Cigarettes  . Quit date: 12/22/2018  . Years since quitting: 0.6  Smokeless Tobacco Never Used    Goals Met:  Independence with exercise equipment Exercise tolerated well Personal goals reviewed No report of cardiac concerns or symptoms Strength training completed today  Goals Unmet:  Not Applicable  Comments: Check out: 1030   Dr. Kate Sable is Medical Director for  [place] and [facility].

## 2019-08-09 NOTE — Progress Notes (Signed)
Cardiac Individual Treatment Plan  Patient Details  Name: [name] [name] MRN: [id number] Date of Birth: December 27, 1961 Referring Provider:     CARDIAC REHAB PHASE II ORIENTATION from 07/13/2019 in [place]  Referring Provider  [name]      Initial Encounter Date:    CARDIAC REHAB PHASE II ORIENTATION from 07/13/2019 in [place]  Date  07/13/19      Visit Diagnosis: S/P CABG x 1   Patient's Home Medications on Admission:  Current Outpatient Medications:  .  albuterol (PROVENTIL HFA;VENTOLIN HFA) 108 (90 Base) MCG/ACT inhaler, Inhale 1 puff into the lungs every 6 (six) hours as needed for wheezing or shortness of breath. , Disp: , Rfl:  .  allopurinol (ZYLOPRIM) 100 MG tablet, Take 100 mg by mouth daily. , Disp: , Rfl:  .  amLODipine (NORVASC) 5 MG tablet, Take 1 tablet (5 mg total) by mouth daily., Disp: 90 tablet, Rfl: 3 .  aspirin EC 81 MG EC tablet, Take 1 tablet (81 mg total) by mouth daily., Disp: , Rfl:  .  atorvastatin (LIPITOR) 80 MG tablet, Take 1 tablet (80 mg total) by mouth daily at 6 PM., Disp: 30 tablet, Rfl: 2 .  esomeprazole (NEXIUM) 40 MG capsule, Take 40 mg by mouth 2 (two) times daily., Disp: , Rfl:  .  HYDROcodone-acetaminophen (NORCO/VICODIN) 5-325 MG tablet, Take 1 tablet by mouth 2 (two) times daily as needed., Disp: , Rfl:  .  ipratropium-albuterol (DUONEB) 0.5-2.5 (3) MG/3ML SOLN, Take 3 mLs by nebulization every 8 (eight) hours as needed (wheezing, shortness of breath)., Disp: 360 mL, Rfl: 0 .  losartan (COZAAR) 100 MG tablet, Take 1 tablet (100 mg total) by mouth daily., Disp: 30 tablet, Rfl: 2 .  metoprolol tartrate (LOPRESSOR) 50 MG tablet, Take 1 tablet (50 mg total) by mouth 2 (two) times daily., Disp: 60 tablet, Rfl: 1 .  Multiple Vitamin (MULITIVITAMIN WITH MINERALS) TABS, Take 1 tablet by mouth daily., Disp: , Rfl:  .  naproxen (NAPROSYN) 500 MG tablet, Take 500 mg by mouth 2 (two) times daily., Disp:  , Rfl:  .  nitroGLYCERIN (NITROSTAT) 0.4 MG SL tablet, Place 0.4 mg under the tongue every 5 (five) minutes as needed. For chest pain, Disp: , Rfl:  .  Omega-3 Fatty Acids (FISH OIL) 1200 MG CPDR, Take 1,200 mg by mouth daily., Disp: , Rfl:  .  spironolactone (ALDACTONE) 25 MG tablet, Take 0.5 tablets (12.5 mg total) by mouth daily., Disp: 30 tablet, Rfl: 2 .  traZODone (DESYREL) 100 MG tablet, Take 100 mg by mouth at bedtime. , Disp: , Rfl:   Past Medical History: Past Medical History:  Diagnosis Date  . Arthritis   . Asthma   . Barrett's esophagus 05/22/2015  . Chronic back pain   . COPD (chronic obstructive pulmonary disease) ([place])   . Coronary artery disease    a. cath in 2010 showing 20% distal LM, 70-80% Ost LAD, 30% OM, and 40% RCA b. low-risk NST in 2014 and 2016 c. 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 Underwent repair of dissection and SVG-RCA.   Marland Kitchen GERD (gastroesophageal reflux disease)   . Gout   . Hemorrhoids   . History of alcohol abuse    Remote  . History of bronchitis 2015  . History of kidney stones   . Hyperlipidemia   . Hypertension   . Lymphocytic colitis  2009  . Myocardial infarction ([place]) 2010   Admit for CP 11/2009; ruled out for MI but had 70-80% ostail LAD stenosis and treated medically following NL perfusion study)  . NASH (nonalcoholic steatohepatitis)   . Peripheral vascular disease ([place])   . Personal history of colonic polyps 07/20/2002   Diminutive adenomas (3) 06/2002 diminutive adenoma (1) 2011   . Pre-diabetes   . Stroke ([place])   . TIA (transient ischemic attack)   . Urinary urgency     Tobacco Use: Social History   Tobacco Use  Smoking Status Former Smoker  . Packs/day: 1.00  . Years: 4.00  . Pack years: 4.00  . Types: Cigarettes  . Quit date: 12/22/2018  . Years since quitting: 0.6  Smokeless Tobacco Never Used    Labs: Recent  Review Flowsheet Data    Labs for ITP Cardiac and Pulmonary Rehab Latest Ref Rng & Units 02/28/2019 03/01/2019 03/02/2019 03/18/2019 04/05/2019   Cholestrol 0 - 200 mg/dL - - - - -   LDLCALC 0 - 99 mg/dL - - - - -   HDL >40 mg/dL - - - - -   Trlycerides <150 mg/dL - - - - -   Hemoglobin A1c 4.8 - 5.6 % - - - - -   PHART 7.350 - 7.450 - - - 7.451(H) 7.517(H)   PCO2ART 32.0 - 48.0 mmHg - - - 35.0 38.5   HCO3 20.0 - 28.0 mmol/L - - - 24.1 31.3(H)   TCO2 22 - 32 mmol/L - - - 25 32   ACIDBASEDEF 0.0 - 2.0 mmol/L - - - - -   O2SAT % 54.3 57.9 54.9 91.0 97.0      Capillary Blood Glucose: Lab Results  Component Value Date   GLUCAP 111 (H) 03/05/2019   GLUCAP 107 (H) 03/05/2019   GLUCAP 101 (H) 03/04/2019   GLUCAP 124 (H) 03/04/2019   GLUCAP 105 (H) 03/04/2019     Exercise Target Goals: Exercise Program Goal: Individual exercise prescription set using results from initial 6 min walk test and THRR while considering  patient's activity barriers and safety.   Exercise Prescription Goal: Starting with aerobic activity 30 plus minutes a day, 3 days per week for initial exercise prescription. Provide home exercise prescription and guidelines that participant acknowledges understanding prior to discharge.  Activity Barriers & Risk Stratification: Activity Barriers & Cardiac Risk Stratification - 07/13/19 1432      Activity Barriers & Cardiac Risk Stratification   Activity Barriers  Back Problems;Joint Problems;Shortness of Breath;Balance Concerns   shoulder, walks with a cane   Cardiac Risk Stratification  High       6 Minute Walk: 6 Minute Walk    Row Name 07/13/19 1431         6 Minute Walk   Phase  Initial     Distance  400 feet     Walk Time  2 minutes had to stop     # of Rest Breaks  0     MPH  0.75     METS  1.58     RPE  13     Perceived Dyspnea   13     VO2 Peak  5.69     Symptoms  Yes (comment)     Comments  5/10 lower back pain and shortness of breath     Resting HR   72 bpm     Resting BP  158/92     Resting Oxygen Saturation  97 %     Exercise Oxygen Saturation  during 6 min walk  97 %     Max Ex. HR  94 bpm     Max Ex. BP  160/70     2 Minute Post BP  140/72        Oxygen Initial Assessment:   Oxygen Re-Evaluation:   Oxygen Discharge (Final Oxygen Re-Evaluation):   Initial Exercise Prescription: Initial Exercise Prescription - 07/13/19 1400      Date of Initial Exercise RX and Referring Provider   Date  07/13/19    Referring Provider  [name]    Expected Discharge Date  10/13/19      Treadmill   MPH  1.4    Grade  0    Minutes  17    METs  2.07      Arm Ergometer   Level  1.5    Watts  14    RPM  20    Minutes  17    METs  1.9      Prescription Details   Frequency (times per week)  3    Duration  Progress to 30 minutes of continuous aerobic without signs/symptoms of physical distress      Intensity   THRR 40-80% of Max Heartrate  [id number]    Ratings of Perceived Exertion  11-13    Perceived Dyspnea  0-4      Progression   Progression  Continue to progress workloads to maintain intensity without signs/symptoms of physical distress.      Resistance Training   Training Prescription  Yes    Weight  1    Reps  10-15       Perform Capillary Blood Glucose checks as needed.  Exercise Prescription Changes:  Exercise Prescription Changes    Row Name 08/09/19 0900             Response to Exercise   Blood Pressure (Admit)  164/88       Blood Pressure (Exercise)  182/84       Blood Pressure (Exit)  154/84       Heart Rate (Admit)  76 bpm       Heart Rate (Exercise)  96 bpm       Heart Rate (Exit)  85 bpm       Rating of Perceived Exertion (Exercise)  12       Symptoms  8/10 chronic back pain        Comments  first two weeks of exercise        Duration  Progress to 30 minutes of  aerobic without signs/symptoms of physical distress       Intensity  THRR New [phone]         Progression   Progression   Continue to progress workloads to maintain intensity without signs/symptoms of physical distress.       Average METs  2.28         Resistance Training   Training Prescription  Yes       Weight  2       Reps  10-15         Treadmill   MPH  1.4       Grade  0       Minutes  17       METs  2.07         Arm Ergometer   Level  1.5       Watts  22  RPM  25       Minutes  17       METs  2.5         Home Exercise Plan   Plans to continue exercise at  Home (comment)       Frequency  Add 2 additional days to program exercise sessions.       Initial Home Exercises Provided  07/13/19          Exercise Comments:  Exercise Comments    Row Name 07/18/19 1331 08/09/19 0937         Exercise Comments  Pt. attended his first complete exercise session today. He tolerated the exercise well. We weren't sure how the Treadmill was going to go due to his history of back issues and surgeries but he was able to walk at 1.4 MPH for 17 minutes.  Pt. has now attended 10 exercise sessions. He is stil able to walk on the TM at 1.4 MPH despite his back pain, but was unable to tolerate an increase in speed. We will continue to work with him and increase when we see fit.         Exercise Goals and Review:  Exercise Goals    Row Name 07/13/19 1434             Exercise Goals   Increase Physical Activity  Yes       Intervention  Develop an individualized exercise prescription for aerobic and resistive training based on initial evaluation findings, risk stratification, comorbidities and participant's personal goals.       Expected Outcomes  Short Term: Attend rehab on a regular basis to increase amount of physical activity.;Long Term: Add in home exercise to make exercise part of routine and to increase amount of physical activity.;Long Term: Exercising regularly at least 3-5 days a week.       Increase Strength and Stamina  Yes       Intervention  Develop an individualized exercise prescription  for aerobic and resistive training based on initial evaluation findings, risk stratification, comorbidities and participant's personal goals.       Expected Outcomes  Short Term: Increase workloads from initial exercise prescription for resistance, speed, and METs.;Short Term: Perform resistance training exercises routinely during rehab and add in resistance training at home;Long Term: Improve cardiorespiratory fitness, muscular endurance and strength as measured by increased METs and functional capacity (6MWT)       Able to understand and use rate of perceived exertion (RPE) scale  Yes       Intervention  Provide education and explanation on how to use RPE scale       Expected Outcomes  Short Term: Able to use RPE daily in rehab to express subjective intensity level;Long Term:  Able to use RPE to guide intensity level when exercising independently       Able to understand and use Dyspnea scale  Yes       Intervention  Provide education and explanation on how to use Dyspnea scale       Expected Outcomes  Short Term: Able to use Dyspnea scale daily in rehab to express subjective sense of shortness of breath during exertion;Long Term: Able to use Dyspnea scale to guide intensity level when exercising independently       Knowledge and understanding of Target Heart Rate Range (THRR)  Yes       Intervention  Provide education and explanation of THRR including how the numbers were predicted and  where they are located for reference       Expected Outcomes  Short Term: Able to state/look up THRR;Long Term: Able to use THRR to govern intensity when exercising independently;Short Term: Able to use daily as guideline for intensity in rehab       Able to check pulse independently  Yes       Intervention  Provide education and demonstration on how to check pulse in carotid and radial arteries.;Review the importance of being able to check your own pulse for safety during independent exercise       Expected Outcomes   Short Term: Able to explain why pulse checking is important during independent exercise;Long Term: Able to check pulse independently and accurately       Understanding of Exercise Prescription  Yes       Intervention  Provide education, explanation, and written materials on patient's individual exercise prescription       Expected Outcomes  Short Term: Able to explain program exercise prescription;Long Term: Able to explain home exercise prescription to exercise independently          Exercise Goals Re-Evaluation : Exercise Goals Re-Evaluation    Row Name 07/18/19 1329 08/09/19 0936           Exercise Goal Re-Evaluation   Exercise Goals Review  Increase Physical Activity;Increase Strength and Stamina;Able to understand and use rate of perceived exertion (RPE) scale;Knowledge and understanding of Target Heart Rate Range (THRR);Able to check pulse independently;Understanding of Exercise Prescription  Increase Physical Activity;Increase Strength and Stamina;Able to understand and use rate of perceived exertion (RPE) scale;Knowledge and understanding of Target Heart Rate Range (THRR);Able to check pulse independently;Understanding of Exercise Prescription      Comments  Today was Brach's first complete exercise session. He was able to follow the prescription and tolerate all of the exercise.  Pt. has been consistant in Cardiac Rehab. He is eager to continue to work towards his goals and decrease his back pain.      Expected Outcomes  Short: increase activity level. Long: increase overall strength and stamina to be able to do more without getting winded.  Short: increase activity level. Long: increase overall strength and stamina to be able to do more without getting winded. Decrease amount of back pain.          Discharge Exercise Prescription (Final Exercise Prescription Changes): Exercise Prescription Changes - 08/09/19 0900      Response to Exercise   Blood Pressure (Admit)  164/88     Blood Pressure (Exercise)  182/84    Blood Pressure (Exit)  154/84    Heart Rate (Admit)  76 bpm    Heart Rate (Exercise)  96 bpm    Heart Rate (Exit)  85 bpm    Rating of Perceived Exertion (Exercise)  12    Symptoms  8/10 chronic back pain     Comments  first two weeks of exercise     Duration  Progress to 30 minutes of  aerobic without signs/symptoms of physical distress    Intensity  THRR New   [phone]     Progression   Progression  Continue to progress workloads to maintain intensity without signs/symptoms of physical distress.    Average METs  2.28      Resistance Training   Training Prescription  Yes    Weight  2    Reps  10-15      Treadmill   MPH  1.4    Grade  0    Minutes  17    METs  2.07      Arm Ergometer   Level  1.5    Watts  22    RPM  25    Minutes  17    METs  2.5      Home Exercise Plan   Plans to continue exercise at  Home (comment)    Frequency  Add 2 additional days to program exercise sessions.    Initial Home Exercises Provided  07/13/19       Nutrition:  Target Goals: Understanding of nutrition guidelines, daily intake of sodium <155m, cholesterol <2081m calories 30% from fat and 7% or less from saturated fats, daily to have 5 or more servings of fruits and vegetables.  Biometrics: Pre Biometrics - 07/13/19 1435      Pre Biometrics   Height  6' (1.829 m)    Waist Circumference  47.5 inches    Hip Circumference  46 inches    Waist to Hip Ratio  1.03 %    BMI (Calculated)  32.34    Triceps Skinfold  14 mm    % Body Fat  33.7 %    Grip Strength  8 kg    Flexibility  0 in   lower back surgery   Single Leg Stand  0 seconds        Nutrition Therapy Plan and Nutrition Goals: Nutrition Therapy & Goals - 08/09/19 1458      Personal Nutrition Goals   Comments  We have not resumed the RD meeting post COVID-19. We are working on resuming this safely and also working with a new dietician. In the interim, we are providing education  through hand outs and one-on-one teaching. Patient says he is trying to eat a healthy diet. Will continue to monitor for progress.      Intervention Plan   Intervention  Nutrition handout(s) given to patient.       Nutrition Assessments: Nutrition Assessments - 07/13/19 1516      Rate Your Plate Scores   Pre Score  78       Nutrition Goals Re-Evaluation:   Nutrition Goals Discharge (Final Nutrition Goals Re-Evaluation):   Psychosocial: Target Goals: Acknowledge presence or absence of significant depression and/or stress, maximize coping skills, provide positive support system. Participant is able to verbalize types and ability to use techniques and skills needed for reducing stress and depression.  Initial Review & Psychosocial Screening: Initial Psych Review & Screening - 07/13/19 1514      Initial Review   Current issues with  None Identified      Family Dynamics   Good Support System?  Yes      Barriers   Psychosocial barriers to participate in program  Psychosocial barriers identified (see note)      Screening Interventions   Interventions  Encouraged to exercise;Provide feedback about the scores to participant    Expected Outcomes  Short Term goal: Identification and review with participant of any Quality of Life or Depression concerns found by scoring the questionnaire.       Quality of Life Scores: Quality of Life - 07/13/19 1436      Quality of Life   Select  Quality of Life      Quality of Life Scores   Health/Function Pre  19.17 %    Socioeconomic Pre  24.83 %    Psych/Spiritual Pre  27.43 %    Family Pre  28.8 %  GLOBAL Pre  23.41 %      Scores of 19 and below usually indicate a poorer quality of life in these areas.  A difference of  2-3 points is a clinically meaningful difference.  A difference of 2-3 points in the total score of the Quality of Life Index has been associated with significant improvement in overall quality of life, self-image,  physical symptoms, and general health in studies assessing change in quality of life.  PHQ-9: Recent Review Flowsheet Data    Depression screen [facility] 2/9 07/13/2019   Decreased Interest 0   Down, Depressed, Hopeless 0   PHQ - 2 Score 0   Altered sleeping 0   Tired, decreased energy 1   Change in appetite 2   Feeling bad or failure about yourself  0   Trouble concentrating 0   Moving slowly or fidgety/restless 0   Suicidal thoughts 0   PHQ-9 Score 3   Difficult doing work/chores Not difficult at all     Interpretation of Total Score  Total Score Depression Severity:  1-4 = Minimal depression, 5-9 = Mild depression, 10-14 = Moderate depression, 15-19 = Moderately severe depression, 20-27 = Severe depression   Psychosocial Evaluation and Intervention: Psychosocial Evaluation - 07/13/19 1514      Psychosocial Evaluation & Interventions   Interventions  Encouraged to exercise with the program and follow exercise prescription;Stress management education;Relaxation education    Comments  Patient's initial QOL score was 23.41% and his PHQ-9 score was 3 with no psychosocial barriers identified.    Expected Outcomes  Patient will have no psychosocial barriers identified at discharge.    Continue Psychosocial Services   No Follow up required       Psychosocial Re-Evaluation: Psychosocial Re-Evaluation    [place] Name 08/09/19 1459             Psychosocial Re-Evaluation   Current issues with  None Identified       Comments  Patient's initial QOL score was 23.4 and his PHQ-9 score was 3 with no psychosocial issues identified. Will continue to monitor.       Expected Outcomes  Patient will have no psychosocial issues identified at discharge.       Interventions  Stress management education;Encouraged to attend Cardiac Rehabilitation for the exercise;Relaxation education       Continue Psychosocial Services   No Follow up required          Psychosocial Discharge (Final Psychosocial  Re-Evaluation): Psychosocial Re-Evaluation - 08/09/19 1459      Psychosocial Re-Evaluation   Current issues with  None Identified    Comments  Patient's initial QOL score was 23.4 and his PHQ-9 score was 3 with no psychosocial issues identified. Will continue to monitor.    Expected Outcomes  Patient will have no psychosocial issues identified at discharge.    Interventions  Stress management education;Encouraged to attend Cardiac Rehabilitation for the exercise;Relaxation education    Continue Psychosocial Services   No Follow up required       Vocational Rehabilitation: Provide vocational rehab assistance to qualifying candidates.   Vocational Rehab Evaluation & Intervention: Vocational Rehab - 07/13/19 1517      Initial Vocational Rehab Evaluation & Intervention   Assessment shows need for Vocational Rehabilitation  No      Vocational Rehab Re-Evaulation   Comments  Patient disabled.       Education: Education Goals: Education classes will be provided on a weekly basis, covering required topics. Participant will state  understanding/return demonstration of topics presented.  Learning Barriers/Preferences: Learning Barriers/Preferences - 07/13/19 1516      Learning Barriers/Preferences   Learning Barriers  None    Learning Preferences  Written Material       Education Topics: Hypertension, Hypertension Reduction -Define heart disease and high blood pressure. Discus how high blood pressure affects the body and ways to reduce high blood pressure.   Exercise and Your Heart -Discuss why it is important to exercise, the FITT principles of exercise, normal and abnormal responses to exercise, and how to exercise safely.   Angina -Discuss definition of angina, causes of angina, treatment of angina, and how to decrease risk of having angina.   Cardiac Medications -Review what the following cardiac medications are used for, how they affect the body, and side effects that  may occur when taking the medications.  Medications include Aspirin, Beta blockers, calcium channel blockers, ACE Inhibitors, angiotensin receptor blockers, diuretics, digoxin, and antihyperlipidemics.   CARDIAC REHAB PHASE II EXERCISE from 07/27/2019 in [place]  Date  07/20/19  Educator  Etheleen Mayhew  Instruction Review Code  2- Demonstrated Understanding      Congestive Heart Failure -Discuss the definition of CHF, how to live with CHF, the signs and symptoms of CHF, and how keep track of weight and sodium intake.   CARDIAC REHAB PHASE II EXERCISE from 07/27/2019 in [place]  Date  07/27/19  Educator  DC  Instruction Review Code  2- Demonstrated Understanding      Heart Disease and Intimacy -Discus the effect sexual activity has on the heart, how changes occur during intimacy as we age, and safety during sexual activity.   Smoking Cessation / COPD -Discuss different methods to quit smoking, the health benefits of quitting smoking, and the definition of COPD.   Nutrition I: Fats -Discuss the types of cholesterol, what cholesterol does to the heart, and how cholesterol levels can be controlled.   Nutrition II: Labels -Discuss the different components of food labels and how to read food label   Heart Parts/Heart Disease and PAD -Discuss the anatomy of the heart, the pathway of blood circulation through the heart, and these are affected by heart disease.   Stress I: Signs and Symptoms -Discuss the causes of stress, how stress may lead to anxiety and depression, and ways to limit stress.   Stress II: Relaxation -Discuss different types of relaxation techniques to limit stress.   Warning Signs of Stroke / TIA -Discuss definition of a stroke, what the signs and symptoms are of a stroke, and how to identify when someone is having stroke.   Knowledge Questionnaire Score: Knowledge Questionnaire Score - 07/13/19 1516       Knowledge Questionnaire Score   Pre Score  17/24       Core Components/Risk Factors/Patient Goals at Admission: Personal Goals and Risk Factors at Admission - 07/13/19 1517      Core Components/Risk Factors/Patient Goals on Admission    Weight Management  Obesity    Hypertension  Yes    Intervention  Provide education on lifestyle modifcations including regular physical activity/exercise, weight management, moderate sodium restriction and increased consumption of fresh fruit, vegetables, and low fat dairy, alcohol moderation, and smoking cessation.;Monitor prescription use compliance.    Expected Outcomes  Short Term: Continued assessment and intervention until BP is < 140/5m HG in hypertensive participants. < 130/814mHG in hypertensive participants with diabetes, heart failure or chronic kidney disease.;Long Term: Maintenance of blood  pressure at goal levels.    Personal Goal Other  Yes    Personal Goal  Do activities without fatigue; be able [place] [employer]    Intervention  Patient will attend cardiac rehab 3 days/week and supplement with exercise at home 2 days/week.    Expected Outcomes  Patient will meet his personal goals.       Core Components/Risk Factors/Patient Goals Review:  Goals and Risk Factor Review    Row Name 08/09/19 1500             Core Components/Risk Factors/Patient Goals Review   Personal Goals Review  Weight Management/Obesity;Other;Hypertension Do activities w/o fatigue; [place] [employer]       Review  Patient has completed 10 sessions gaining 7 lbs since his initial visit. He is doing well in the program with progression. His blood pressure remains high inspite of medication adjustments made by Dr. [name]. Dr. [name] has be notified of his hypertensive readings and says patient is supposed to come back for a blood pressure check and he wants to give the medication to work. Patient also has chronic back pain which  prevents him from progresisng on the treadmill. He does say he enjoys coming to the program and he feels somewhat stronger. He wants to continue in spite of his back pain. Will continue to monitor for progress.       Expected Outcomes  Patient will continue to attend sessions and complete the program and continue to meet his personal goals.          Core Components/Risk Factors/Patient Goals at Discharge (Final Review):  Goals and Risk Factor Review - 08/09/19 1500      Core Components/Risk Factors/Patient Goals Review   Personal Goals Review  Weight Management/Obesity;Other;Hypertension   Do activities w/o fatigue; [place] [employer]   Review  Patient has completed 10 sessions gaining 7 lbs since his initial visit. He is doing well in the program with progression. His blood pressure remains high inspite of medication adjustments made by Dr. [name]. Dr. [name] has be notified of his hypertensive readings and says patient is supposed to come back for a blood pressure check and he wants to give the medication to work. Patient also has chronic back pain which prevents him from progresisng on the treadmill. He does say he enjoys coming to the program and he feels somewhat stronger. He wants to continue in spite of his back pain. Will continue to monitor for progress.    Expected Outcomes  Patient will continue to attend sessions and complete the program and continue to meet his personal goals.       ITP Comments: ITP Comments    Row Name 07/18/19 1518           ITP Comments  Patient new to program. He has completed 2 sessions. Will continue to monitor for progress.          Comments: ITP REVIEW Patient is doing well in the program. Will continue to monitor for progress.

## 2019-08-10 ENCOUNTER — Other Ambulatory Visit: Payer: Self-pay

## 2019-08-10 ENCOUNTER — Encounter (HOSPITAL_COMMUNITY)
Admission: RE | Admit: 2019-08-10 | Discharge: 2019-08-10 | Disposition: A | Discharge status: Home / Self Care | Payer: Medicare Other | Source: Ambulatory Visit | Attending: Cardiology | Admitting: Cardiology

## 2019-08-10 DIAGNOSIS — Z951 Presence of aortocoronary bypass graft: Secondary | ICD-10-CM | POA: Diagnosis not present

## 2019-08-10 NOTE — Progress Notes (Signed)
Daily Session Note  Patient Details  Name: [name] [name] MRN: [id number] Date of Birth: July 12, 1961 Referring Provider:     CARDIAC REHAB PHASE II ORIENTATION from 07/13/2019 in [place]  Referring Provider  [name]      Encounter Date: 08/10/2019  Check In: Session Check In - 08/10/19 0930      Check-In   Supervising physician immediately available to respond to emergencies  See telemetry face sheet for immediately available MD    Location  [facility]    Staff Present  Russella Dar, MS, EP, [facility], Exercise Physiologist;Debra Wynetta Emery, RN, Cory Munch, Exercise Physiologist    Virtual Visit  No    Medication changes reported      No    Fall or balance concerns reported     Yes    Comments  walks with cane    Tobacco Cessation  No Change    Warm-up and Cool-down  Performed as group-led instruction    Resistance Training Performed  Yes    VAD Patient?  No    PAD/SET Patient?  No      Pain Assessment   Currently in Pain?  No/denies    Multiple Pain Sites  No       Capillary Blood Glucose: No results found for this or any previous visit (from the past 24 hour(s)).    Social History   Tobacco Use  Smoking Status Former Smoker  . Packs/day: 1.00  . Years: 4.00  . Pack years: 4.00  . Types: Cigarettes  . Quit date: 12/22/2018  . Years since quitting: 0.6  Smokeless Tobacco Never Used    Goals Met:  Independence with exercise equipment Exercise tolerated well No report of cardiac concerns or symptoms Strength training completed today  Goals Unmet:  Not Applicable  Comments: Pt able to follow exercise prescription today without complaint.  Will continue to monitor for progression. Check out 1030.   Dr. Kate Sable is Medical Director for [facility] Cardiac and Pulmonary Rehab.

## 2019-08-12 ENCOUNTER — Encounter (HOSPITAL_COMMUNITY)
Admission: RE | Admit: 2019-08-12 | Discharge: 2019-08-12 | Disposition: A | Discharge status: Home / Self Care | Payer: Medicare Other | Source: Ambulatory Visit | Attending: Cardiology | Admitting: Cardiology

## 2019-08-12 ENCOUNTER — Other Ambulatory Visit: Payer: Self-pay

## 2019-08-12 DIAGNOSIS — Z951 Presence of aortocoronary bypass graft: Secondary | ICD-10-CM

## 2019-08-12 NOTE — Progress Notes (Signed)
Daily Session Note  Patient Details  Name: WHITAKER HOLDERMAN MRN: [id number] Date of Birth: [birth year] Referring Provider:     CARDIAC REHAB PHASE II ORIENTATION from 07/13/2019 in [place]  Referring Provider  [name]      Encounter Date: 08/12/2019  Check In: Session Check In - 08/12/19 0930      Check-In   Supervising physician immediately available to respond to emergencies  See telemetry face sheet for immediately available MD    Location  [facility]    Staff Present  Russella Dar, MS, EP, [facility], Exercise Physiologist;Annelie Boak Zachery Conch, Exercise Physiologist    Virtual Visit  No    Medication changes reported      No    Fall or balance concerns reported     Yes    Comments  walks with cane    Tobacco Cessation  No Change    Warm-up and Cool-down  Performed as group-led instruction    Resistance Training Performed  Yes    VAD Patient?  No    PAD/SET Patient?  No      Pain Assessment   Currently in Pain?  No/denies    Pain Score  0-No pain    Multiple Pain Sites  No       Capillary Blood Glucose: No results found for this or any previous visit (from the past 24 hour(s)).    Social History   Tobacco Use  Smoking Status Former Smoker  . Packs/day: 1.00  . Years: 4.00  . Pack years: 4.00  . Types: Cigarettes  . Quit date: 12/22/2018  . Years since quitting: 0.6  Smokeless Tobacco Never Used    Goals Met:  Independence with exercise equipment Exercise tolerated well No report of cardiac concerns or symptoms Strength training completed today  Goals Unmet:  Not Applicable  Comments: Pt able to follow exercise prescription today without complaint.  Will continue to monitor for progression. Check out 1030.   Dr. Kate Sable is Medical Director for [facility] Cardiac and Pulmonary Rehab.

## 2019-08-15 ENCOUNTER — Other Ambulatory Visit: Payer: Self-pay

## 2019-08-15 ENCOUNTER — Encounter (HOSPITAL_COMMUNITY)
Admission: RE | Admit: 2019-08-15 | Discharge: 2019-08-15 | Disposition: A | Discharge status: Home / Self Care | Payer: Medicare Other | Source: Ambulatory Visit | Attending: Cardiology | Admitting: Cardiology

## 2019-08-15 DIAGNOSIS — Z951 Presence of aortocoronary bypass graft: Secondary | ICD-10-CM

## 2019-08-15 NOTE — Progress Notes (Signed)
Daily Session Note  Patient Details  Name: [name] [name] MRN: [id number] Date of Birth: Dec 16, 1961 Referring Provider:     CARDIAC REHAB PHASE II ORIENTATION from 07/13/2019 in [place]  Referring Provider  [name]      Encounter Date: 08/15/2019  Check In: Session Check In - 08/15/19 0930      Check-In   Supervising physician immediately available to respond to emergencies  See telemetry face sheet for immediately available ER MD    Location  [facility]    Staff Present  Russella Dar, MS, EP, [facility], Exercise Physiologist;Amanda Ballard, Exercise Physiologist;Trevione Wert Wynetta Emery, RN, BSN    Virtual Visit  No    Medication changes reported      No    Fall or balance concerns reported     Yes    Comments  walks with cane    Tobacco Cessation  No Change    Warm-up and Cool-down  Performed as group-led instruction    Resistance Training Performed  Yes    VAD Patient?  No    PAD/SET Patient?  No      Pain Assessment   Currently in Pain?  No/denies    Pain Score  0-No pain    Multiple Pain Sites  No       Capillary Blood Glucose: No results found for this or any previous visit (from the past 24 hour(s)).    Social History   Tobacco Use  Smoking Status Former Smoker  . Packs/day: 1.00  . Years: 4.00  . Pack years: 4.00  . Types: Cigarettes  . Quit date: 12/22/2018  . Years since quitting: 0.6  Smokeless Tobacco Never Used    Goals Met:  Independence with exercise equipment Exercise tolerated well No report of cardiac concerns or symptoms Strength training completed today  Goals Unmet:  Not Applicable  Comments: Pt able to follow exercise prescription today without complaint.  Will continue to monitor for progression. Check out 1030.   Dr. Kate Sable is Medical Director for [facility] Cardiac and Pulmonary Rehab.

## 2019-08-17 ENCOUNTER — Encounter (HOSPITAL_COMMUNITY)
Admission: RE | Admit: 2019-08-17 | Discharge: 2019-08-17 | Disposition: A | Discharge status: Home / Self Care | Payer: Medicare Other | Source: Ambulatory Visit | Attending: Cardiology | Admitting: Cardiology

## 2019-08-17 ENCOUNTER — Other Ambulatory Visit: Payer: Self-pay

## 2019-08-17 DIAGNOSIS — Z951 Presence of aortocoronary bypass graft: Secondary | ICD-10-CM

## 2019-08-17 NOTE — Progress Notes (Signed)
Daily Session Note  Patient Details  Name: ESCHER HARR MRN: [id number] Date of Birth: 1961-12-11 Referring Provider:     CARDIAC REHAB PHASE II ORIENTATION from 07/13/2019 in [place]  Referring Provider  [name]      Encounter Date: 08/17/2019  Check In: Session Check In - 08/17/19 0930      Check-In   Supervising physician immediately available to respond to emergencies  See telemetry face sheet for immediately available ER MD    Location  [facility]    Staff Present  Russella Dar, MS, EP, [facility], Exercise Physiologist;Amanda Ballard, Exercise Physiologist;Johnhenry Tippin Wynetta Emery, RN, BSN    Virtual Visit  No    Medication changes reported      No    Fall or balance concerns reported     Yes    Comments  walks with cane    Tobacco Cessation  No Change    Warm-up and Cool-down  Performed as group-led instruction    Resistance Training Performed  Yes    VAD Patient?  No    PAD/SET Patient?  No      Pain Assessment   Currently in Pain?  No/denies    Pain Score  0-No pain    Multiple Pain Sites  No       Capillary Blood Glucose: No results found for this or any previous visit (from the past 24 hour(s)).    Social History   Tobacco Use  Smoking Status Former Smoker  . Packs/day: 1.00  . Years: 4.00  . Pack years: 4.00  . Types: Cigarettes  . Quit date: 12/22/2018  . Years since quitting: 0.6  Smokeless Tobacco Never Used    Goals Met:  Independence with exercise equipment Exercise tolerated well No report of cardiac concerns or symptoms Strength training completed today  Goals Unmet:  Not Applicable  Comments: Pt able to follow exercise prescription today without complaint.  Will continue to monitor for progression. Check out 1030.   Dr. Kate Sable is Medical Director for [facility] Cardiac and Pulmonary Rehab.

## 2019-08-19 ENCOUNTER — Encounter (HOSPITAL_COMMUNITY): Payer: Medicare Other

## 2019-08-22 ENCOUNTER — Other Ambulatory Visit: Payer: Self-pay

## 2019-08-22 ENCOUNTER — Encounter (HOSPITAL_COMMUNITY)
Admission: RE | Admit: 2019-08-22 | Discharge: 2019-08-22 | Disposition: A | Discharge status: Home / Self Care | Payer: Medicare Other | Source: Ambulatory Visit | Attending: Cardiology | Admitting: Cardiology

## 2019-08-22 ENCOUNTER — Ambulatory Visit: Payer: Medicare Other | Admitting: *Deleted

## 2019-08-22 ENCOUNTER — Telehealth: Payer: Self-pay | Admitting: *Deleted

## 2019-08-22 VITALS — BP 150/79 | HR 68 | Temp 97.7°F | Ht 68.5 in | Wt 248.0 lb

## 2019-08-22 DIAGNOSIS — Z951 Presence of aortocoronary bypass graft: Secondary | ICD-10-CM | POA: Diagnosis not present

## 2019-08-22 DIAGNOSIS — I10 Essential (primary) hypertension: Secondary | ICD-10-CM

## 2019-08-22 NOTE — Telephone Encounter (Signed)
-----   Message from Satira Sark, MD sent at 08/22/2019 11:38 AM EDT -----   ----- Message ----- From: Levonne Hubert, LPN Sent: 7/32/2567   8:47 AM EDT To: Satira Sark, MD

## 2019-08-22 NOTE — Telephone Encounter (Signed)
Pt.notified

## 2019-08-22 NOTE — Progress Notes (Signed)
Agree with recent increase in Norvasc to 10 mg daily.  Continue current regimen for now and keep follow-up with PCP.

## 2019-08-22 NOTE — Progress Notes (Signed)
Pt states that he feels "OK" today". PCP increased Norvasc to 10 mg daily. Pt reports taking morning meds today at 0730. Please advise.

## 2019-08-22 NOTE — Progress Notes (Signed)
Daily Session Note  Patient Details  Name: [name] [name] MRN: [id number] Date of Birth: Feb 28, 1961 Referring Provider:     CARDIAC REHAB PHASE II ORIENTATION from 07/13/2019 in [place]  Referring Provider  [name]      Encounter Date: 08/22/2019  Check In: Session Check In - 08/22/19 0930      Check-In   Supervising physician immediately available to respond to emergencies  See telemetry face sheet for immediately available MD    Location  [facility]    Staff Present  Russella Dar, MS, EP, [facility], Exercise Physiologist;Amanda Ballard, Exercise Physiologist;Halim Surrette Wynetta Emery, RN, BSN    Virtual Visit  No    Medication changes reported      Yes    Comments  PCP increased Norvasc to 10 mg daily last week.    Fall or balance concerns reported     Yes    Comments  walks with cane    Tobacco Cessation  No Change    Warm-up and Cool-down  Performed as group-led instruction    Resistance Training Performed  Yes    VAD Patient?  No    PAD/SET Patient?  No      Pain Assessment   Currently in Pain?  No/denies    Pain Score  0-No pain    Multiple Pain Sites  No       Capillary Blood Glucose: No results found for this or any previous visit (from the past 24 hour(s)).    Social History   Tobacco Use  Smoking Status Former Smoker  . Packs/day: 1.00  . Years: 4.00  . Pack years: 4.00  . Types: Cigarettes  . Quit date: 12/22/2018  . Years since quitting: 0.6  Smokeless Tobacco Never Used    Goals Met:  Independence with exercise equipment Exercise tolerated well No report of cardiac concerns or symptoms Strength training completed today  Goals Unmet:  Not Applicable  Comments: Pt able to follow exercise prescription today without complaint.  Will continue to monitor for progression. Check out 1030.   Dr. Kate Sable is Medical Director for [facility] Cardiac and Pulmonary Rehab.

## 2019-08-24 ENCOUNTER — Encounter (HOSPITAL_COMMUNITY): Payer: Medicare Other

## 2019-08-26 ENCOUNTER — Encounter (HOSPITAL_COMMUNITY): Payer: Medicare Other

## 2019-08-29 ENCOUNTER — Encounter (HOSPITAL_COMMUNITY): Payer: Medicare Other

## 2019-08-30 NOTE — Progress Notes (Signed)
Cardiac Individual Treatment Plan  Patient Details  Name: [name] [name] MRN: [id number] Date of Birth: 12-Nov-1961 Referring Provider:     CARDIAC REHAB PHASE II ORIENTATION from 07/13/2019 in [place]  Referring Provider  [name]      Initial Encounter Date:    CARDIAC REHAB PHASE II ORIENTATION from 07/13/2019 in [place]  Date  07/13/19      Visit Diagnosis: S/P CABG x 1  Patient's Home Medications on Admission:  Current Outpatient Medications:  .  albuterol (PROVENTIL HFA;VENTOLIN HFA) 108 (90 Base) MCG/ACT inhaler, Inhale 1 puff into the lungs every 6 (six) hours as needed for wheezing or shortness of breath. , Disp: , Rfl:  .  allopurinol (ZYLOPRIM) 100 MG tablet, Take 100 mg by mouth daily. , Disp: , Rfl:  .  amLODipine (NORVASC) 10 MG tablet, Take 10 mg by mouth daily., Disp: , Rfl:  .  aspirin EC 81 MG EC tablet, Take 1 tablet (81 mg total) by mouth daily., Disp: , Rfl:  .  atorvastatin (LIPITOR) 80 MG tablet, Take 1 tablet (80 mg total) by mouth daily at 6 PM., Disp: 30 tablet, Rfl: 2 .  esomeprazole (NEXIUM) 40 MG capsule, Take 40 mg by mouth 2 (two) times daily., Disp: , Rfl:  .  HYDROcodone-acetaminophen (NORCO/VICODIN) 5-325 MG tablet, Take 1 tablet by mouth 2 (two) times daily as needed., Disp: , Rfl:  .  ipratropium-albuterol (DUONEB) 0.5-2.5 (3) MG/3ML SOLN, Take 3 mLs by nebulization every 8 (eight) hours as needed (wheezing, shortness of breath)., Disp: 360 mL, Rfl: 0 .  losartan (COZAAR) 100 MG tablet, Take 1 tablet (100 mg total) by mouth daily., Disp: 30 tablet, Rfl: 2 .  metoprolol tartrate (LOPRESSOR) 50 MG tablet, Take 1 tablet (50 mg total) by mouth 2 (two) times daily., Disp: 60 tablet, Rfl: 1 .  milk thistle 175 MG tablet, Take 175 mg by mouth daily., Disp: , Rfl:  .  Multiple Vitamin (MULITIVITAMIN WITH MINERALS) TABS, Take 1 tablet by mouth daily., Disp: , Rfl:  .  naproxen (NAPROSYN) 500 MG tablet,  Take 500 mg by mouth 2 (two) times daily., Disp: , Rfl:  .  nitroGLYCERIN (NITROSTAT) 0.4 MG SL tablet, Place 0.4 mg under the tongue every 5 (five) minutes as needed. For chest pain, Disp: , Rfl:  .  Omega-3 Fatty Acids (FISH OIL) 1200 MG CPDR, Take 1,200 mg by mouth daily., Disp: , Rfl:  .  spironolactone (ALDACTONE) 25 MG tablet, Take 0.5 tablets (12.5 mg total) by mouth daily., Disp: 30 tablet, Rfl: 2 .  traZODone (DESYREL) 100 MG tablet, Take 100 mg by mouth at bedtime. , Disp: , Rfl:   Past Medical History: Past Medical History:  Diagnosis Date  . Arthritis   . Asthma   . Barrett's esophagus 05/22/2015  . Chronic back pain   . COPD (chronic obstructive pulmonary disease) ([place])   . Coronary artery disease    a. cath in 2010 showing 20% distal LM, 70-80% Ost LAD, 30% OM, and 40% RCA b. low-risk NST in 2014 and 2016 c. 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 Underwent repair of dissection and SVG-RCA.   Marland Kitchen GERD (gastroesophageal reflux disease)   . Gout   . Hemorrhoids   . History of alcohol abuse    Remote  . History of bronchitis 2015  . History of kidney stones   .  Hyperlipidemia   . Hypertension   . Lymphocytic colitis 2009  . Myocardial infarction ([place]) 2010   Admit for CP 11/2009; ruled out for MI but had 70-80% ostail LAD stenosis and treated medically following NL perfusion study)  . NASH (nonalcoholic steatohepatitis)   . Peripheral vascular disease ([place])   . Personal history of colonic polyps 07/20/2002   Diminutive adenomas (3) 06/2002 diminutive adenoma (1) 2011   . Pre-diabetes   . Stroke ([place])   . TIA (transient ischemic attack)   . Urinary urgency     Tobacco Use: Social History   Tobacco Use  Smoking Status Former Smoker  . Packs/day: 1.00  . Years: 4.00  . Pack years: 4.00  . Types: Cigarettes  . Quit date: 12/22/2018  . Years since quitting: 0.6   Smokeless Tobacco Never Used    Labs: Recent Review Flowsheet Data    Labs for ITP Cardiac and Pulmonary Rehab Latest Ref Rng & Units 02/28/2019 03/01/2019 03/02/2019 03/18/2019 04/05/2019   Cholestrol 0 - 200 mg/dL - - - - -   LDLCALC 0 - 99 mg/dL - - - - -   HDL >40 mg/dL - - - - -   Trlycerides <150 mg/dL - - - - -   Hemoglobin A1c 4.8 - 5.6 % - - - - -   PHART 7.350 - 7.450 - - - 7.451(H) 7.517(H)   PCO2ART 32.0 - 48.0 mmHg - - - 35.0 38.5   HCO3 20.0 - 28.0 mmol/L - - - 24.1 31.3(H)   TCO2 22 - 32 mmol/L - - - 25 32   ACIDBASEDEF 0.0 - 2.0 mmol/L - - - - -   O2SAT % 54.3 57.9 54.9 91.0 97.0      Capillary Blood Glucose: Lab Results  Component Value Date   GLUCAP 111 (H) 03/05/2019   GLUCAP 107 (H) 03/05/2019   GLUCAP 101 (H) 03/04/2019   GLUCAP 124 (H) 03/04/2019   GLUCAP 105 (H) 03/04/2019     Exercise Target Goals: Exercise Program Goal: Individual exercise prescription set using results from initial 6 min walk test and THRR while considering  patient's activity barriers and safety.   Exercise Prescription Goal: Starting with aerobic activity 30 plus minutes a day, 3 days per week for initial exercise prescription. Provide home exercise prescription and guidelines that participant acknowledges understanding prior to discharge.  Activity Barriers & Risk Stratification: Activity Barriers & Cardiac Risk Stratification - 07/13/19 1432      Activity Barriers & Cardiac Risk Stratification   Activity Barriers  Back Problems;Joint Problems;Shortness of Breath;Balance Concerns   shoulder, walks with a cane   Cardiac Risk Stratification  High       6 Minute Walk: 6 Minute Walk    Row Name 07/13/19 1431         6 Minute Walk   Phase  Initial     Distance  400 feet     Walk Time  2 minutes had to stop     # of Rest Breaks  0     MPH  0.75     METS  1.58     RPE  13     Perceived Dyspnea   13     VO2 Peak  5.69     Symptoms  Yes (comment)     Comments  5/10 lower  back pain and shortness of breath     Resting HR  72 bpm     Resting BP  158/92     Resting Oxygen Saturation   97 %     Exercise Oxygen Saturation  during 6 min walk  97 %     Max Ex. HR  94 bpm     Max Ex. BP  160/70     2 Minute Post BP  140/72        Oxygen Initial Assessment:   Oxygen Re-Evaluation:   Oxygen Discharge (Final Oxygen Re-Evaluation):   Initial Exercise Prescription: Initial Exercise Prescription - 07/13/19 1400      Date of Initial Exercise RX and Referring Provider   Date  07/13/19    Referring Provider  [name]    Expected Discharge Date  10/13/19      Treadmill   MPH  1.4    Grade  0    Minutes  17    METs  2.07      Arm Ergometer   Level  1.5    Watts  14    RPM  20    Minutes  17    METs  1.9      Prescription Details   Frequency (times per week)  3    Duration  Progress to 30 minutes of continuous aerobic without signs/symptoms of physical distress      Intensity   THRR 40-80% of Max Heartrate  65-97-129    Ratings of Perceived Exertion  11-13    Perceived Dyspnea  0-4      Progression   Progression  Continue to progress workloads to maintain intensity without signs/symptoms of physical distress.      Resistance Training   Training Prescription  Yes    Weight  1    Reps  10-15       Perform Capillary Blood Glucose checks as needed.  Exercise Prescription Changes:  Exercise Prescription Changes    Row Name 08/09/19 0900 08/30/19 1000           Response to Exercise   Blood Pressure (Admit)  164/88  152/82      Blood Pressure (Exercise)  182/84  160/80      Blood Pressure (Exit)  154/84  130/80      Heart Rate (Admit)  76 bpm  65 bpm      Heart Rate (Exercise)  96 bpm  94 bpm      Heart Rate (Exit)  85 bpm  73 bpm      Rating of Perceived Exertion (Exercise)  12  12      Symptoms  8/10 chronic back pain   -      Comments  first two weeks of exercise   increase in overall MET  level       Duration  Progress to 30  minutes of  aerobic without signs/symptoms of physical distress  Continue with 30 min of aerobic exercise without signs/symptoms of physical distress.      Intensity  THRR New [id number]  THRR unchanged        Progression   Progression  Continue to progress workloads to maintain intensity without signs/symptoms of physical distress.  Continue to progress workloads to maintain intensity without signs/symptoms of physical distress.      Average METs  2.28  2.47        Resistance Training   Training Prescription  Yes  Yes      Weight  2  3      Reps  10-15  10-15  Treadmill   MPH  1.4  1.5      Grade  0  0      Minutes  17  17      METs  2.07  2.15        Arm Ergometer   Level  1.5  2.5      Watts  22  22      RPM  25  26      Minutes  17  17      METs  2.5  2.8        Home Exercise Plan   Plans to continue exercise at  Home (comment)  Home (comment)      Frequency  Add 2 additional days to program exercise sessions.  Add 2 additional days to program exercise sessions.      Initial Home Exercises Provided  07/13/19  07/13/19         Exercise Comments:  Exercise Comments    Row Name 07/18/19 1331 08/09/19 0937 08/30/19 1030       Exercise Comments  Pt. attended his first complete exercise session today. He tolerated the exercise well. We weren't sure how the Treadmill was going to go due to his history of back issues and surgeries but he was able to walk at 1.4 MPH for 17 minutes.  Pt. has now attended 10 exercise sessions. He is stil able to walk on the TM at 1.4 MPH despite his back pain, but was unable to tolerate an increase in speed. We will continue to work with him and increase when we see fit.  Pt. has not attended since 08/22/2019. He is waiting to hear back from  an arm specialist.        Exercise Goals and Review:  Exercise Goals    Row Name 07/13/19 1434             Exercise Goals   Increase Physical Activity  Yes       Intervention  Develop an  individualized exercise prescription for aerobic and resistive training based on initial evaluation findings, risk stratification, comorbidities and participant's personal goals.       Expected Outcomes  Short Term: Attend rehab on a regular basis to increase amount of physical activity.;Long Term: Add in home exercise to make exercise part of routine and to increase amount of physical activity.;Long Term: Exercising regularly at least 3-5 days a week.       Increase Strength and Stamina  Yes       Intervention  Develop an individualized exercise prescription for aerobic and resistive training based on initial evaluation findings, risk stratification, comorbidities and participant's personal goals.       Expected Outcomes  Short Term: Increase workloads from initial exercise prescription for resistance, speed, and METs.;Short Term: Perform resistance training exercises routinely during rehab and add in resistance training at home;Long Term: Improve cardiorespiratory fitness, muscular endurance and strength as measured by increased METs and functional capacity (6MWT)       Able to understand and use rate of perceived exertion (RPE) scale  Yes       Intervention  Provide education and explanation on how to use RPE scale       Expected Outcomes  Short Term: Able to use RPE daily in rehab to express subjective intensity level;Long Term:  Able to use RPE to guide intensity level when exercising independently       Able to understand and use Dyspnea scale  Yes       Intervention  Provide education and explanation on how to use Dyspnea scale       Expected Outcomes  Short Term: Able to use Dyspnea scale daily in rehab to express subjective sense of shortness of breath during exertion;Long Term: Able to use Dyspnea scale to guide intensity level when exercising independently       Knowledge and understanding of Target Heart Rate Range (THRR)  Yes       Intervention  Provide education and explanation of THRR  including how the numbers were predicted and where they are located for reference       Expected Outcomes  Short Term: Able to state/look up THRR;Long Term: Able to use THRR to govern intensity when exercising independently;Short Term: Able to use daily as guideline for intensity in rehab       Able to check pulse independently  Yes       Intervention  Provide education and demonstration on how to check pulse in carotid and radial arteries.;Review the importance of being able to check your own pulse for safety during independent exercise       Expected Outcomes  Short Term: Able to explain why pulse checking is important during independent exercise;Long Term: Able to check pulse independently and accurately       Understanding of Exercise Prescription  Yes       Intervention  Provide education, explanation, and written materials on patient's individual exercise prescription       Expected Outcomes  Short Term: Able to explain program exercise prescription;Long Term: Able to explain home exercise prescription to exercise independently          Exercise Goals Re-Evaluation : Exercise Goals Re-Evaluation    Row Name 07/18/19 1329 08/09/19 0936 08/30/19 1029         Exercise Goal Re-Evaluation   Exercise Goals Review  Increase Physical Activity;Increase Strength and Stamina;Able to understand and use rate of perceived exertion (RPE) scale;Knowledge and understanding of Target Heart Rate Range (THRR);Able to check pulse independently;Understanding of Exercise Prescription  Increase Physical Activity;Increase Strength and Stamina;Able to understand and use rate of perceived exertion (RPE) scale;Knowledge and understanding of Target Heart Rate Range (THRR);Able to check pulse independently;Understanding of Exercise Prescription  Increase Physical Activity;Increase Strength and Stamina;Able to understand and use rate of perceived exertion (RPE) scale;Knowledge and understanding of Target Heart Rate Range  (THRR);Able to check pulse independently;Understanding of Exercise Prescription     Comments  Today was [name] [name]'s first complete exercise session. He was able to follow the prescription and tolerate all of the exercise.  Pt. has been consistant in Cardiac Rehab. He is eager to continue to work towards his goals and decrease his back pain.  Pt. has not been to CR since 08/22/2019. Until then he was regular and feeling more comfortable walking on the TM.     Expected Outcomes  Short: increase activity level. Long: increase overall strength and stamina to be able to do more without getting winded.  Short: increase activity level. Long: increase overall strength and stamina to be able to do more without getting winded. Decrease amount of back pain.  Short: increase activity level. Long: increase overall strength and stamina to be able to do more without getting winded. Decrease amount of back pain.         Discharge Exercise Prescription (Final Exercise Prescription Changes): Exercise Prescription Changes - 08/30/19 1000      Response to Exercise  Blood Pressure (Admit)  152/82    Blood Pressure (Exercise)  160/80    Blood Pressure (Exit)  130/80    Heart Rate (Admit)  65 bpm    Heart Rate (Exercise)  94 bpm    Heart Rate (Exit)  73 bpm    Rating of Perceived Exertion (Exercise)  12    Comments  increase in overall MET  level     Duration  Continue with 30 min of aerobic exercise without signs/symptoms of physical distress.    Intensity  THRR unchanged      Progression   Progression  Continue to progress workloads to maintain intensity without signs/symptoms of physical distress.    Average METs  2.47      Resistance Training   Training Prescription  Yes    Weight  3    Reps  10-15      Treadmill   MPH  1.5    Grade  0    Minutes  17    METs  2.15      Arm Ergometer   Level  2.5    Watts  22    RPM  26    Minutes  17    METs  2.8      Home Exercise Plan   Plans to continue  exercise at  Home (comment)    Frequency  Add 2 additional days to program exercise sessions.    Initial Home Exercises Provided  07/13/19       Nutrition:  Target Goals: Understanding of nutrition guidelines, daily intake of sodium <1531m, cholesterol <2055m calories 30% from fat and 7% or less from saturated fats, daily to have 5 or more servings of fruits and vegetables.  Biometrics: Pre Biometrics - 07/13/19 1435      Pre Biometrics   Height  6' (1.829 m)    Waist Circumference  47.5 inches    Hip Circumference  46 inches    Waist to Hip Ratio  1.03 %    BMI (Calculated)  32.34    Triceps Skinfold  14 mm    % Body Fat  33.7 %    Grip Strength  8 kg    Flexibility  0 in   lower back surgery   Single Leg Stand  0 seconds        Nutrition Therapy Plan and Nutrition Goals: Nutrition Therapy & Goals - 08/30/19 1250      Personal Nutrition Goals   Comments  We have not resumed the RD meeting post COVID-19. We are working on resuming this safely and also working with a new dietician. In the interim, we are providing education through hand outs and one-on-one teaching. Patient says he is trying to eat a healthy diet. Will continue to monitor for progress.      Intervention Plan   Intervention  Nutrition handout(s) given to patient.       Nutrition Assessments: Nutrition Assessments - 07/13/19 1516      Rate Your Plate Scores   Pre Score  78       Nutrition Goals Re-Evaluation:   Nutrition Goals Discharge (Final Nutrition Goals Re-Evaluation):   Psychosocial: Target Goals: Acknowledge presence or absence of significant depression and/or stress, maximize coping skills, provide positive support system. Participant is able to verbalize types and ability to use techniques and skills needed for reducing stress and depression.  Initial Review & Psychosocial Screening: Initial Psych Review & Screening - 07/13/19 1514  Initial Review   Current issues with  None  Identified      Family Dynamics   Good Support System?  Yes      Barriers   Psychosocial barriers to participate in program  Psychosocial barriers identified (see note)      Screening Interventions   Interventions  Encouraged to exercise;Provide feedback about the scores to participant    Expected Outcomes  Short Term goal: Identification and review with participant of any Quality of Life or Depression concerns found by scoring the questionnaire.       Quality of Life Scores: Quality of Life - 07/13/19 1436      Quality of Life   Select  Quality of Life      Quality of Life Scores   Health/Function Pre  19.17 %    Socioeconomic Pre  24.83 %    Psych/Spiritual Pre  27.43 %    Family Pre  28.8 %    GLOBAL Pre  23.41 %      Scores of 19 and below usually indicate a poorer quality of life in these areas.  A difference of  2-3 points is a clinically meaningful difference.  A difference of 2-3 points in the total score of the Quality of Life Index has been associated with significant improvement in overall quality of life, self-image, physical symptoms, and general health in studies assessing change in quality of life.  PHQ-9: Recent Review Flowsheet Data    Depression screen [facility] 2/9 07/13/2019   Decreased Interest 0   Down, Depressed, Hopeless 0   PHQ - 2 Score 0   Altered sleeping 0   Tired, decreased energy 1   Change in appetite 2   Feeling bad or failure about yourself  0   Trouble concentrating 0   Moving slowly or fidgety/restless 0   Suicidal thoughts 0   PHQ-9 Score 3   Difficult doing work/chores Not difficult at all     Interpretation of Total Score  Total Score Depression Severity:  1-4 = Minimal depression, 5-9 = Mild depression, 10-14 = Moderate depression, 15-19 = Moderately severe depression, 20-27 = Severe depression   Psychosocial Evaluation and Intervention: Psychosocial Evaluation - 07/13/19 1514      Psychosocial Evaluation & Interventions    Interventions  Encouraged to exercise with the program and follow exercise prescription;Stress management education;Relaxation education    Comments  Patient's initial QOL score was 23.41% and his PHQ-9 score was 3 with no psychosocial barriers identified.    Expected Outcomes  Patient will have no psychosocial barriers identified at discharge.    Continue Psychosocial Services   No Follow up required       Psychosocial Re-Evaluation: Psychosocial Re-Evaluation    [place] Name 08/09/19 1459 08/30/19 1255           Psychosocial Re-Evaluation   Current issues with  None Identified  None Identified      Comments  Patient's initial QOL score was 23.4 and his PHQ-9 score was 3 with no psychosocial issues identified. Will continue to monitor.  Patient's initial QOL score was 23.4 and his PHQ-9 score was 3 with no psychosocial issues identified. Will continue to monitor.      Expected Outcomes  Patient will have no psychosocial issues identified at discharge.  Patient will have no psychosocial issues identified at discharge.      Interventions  Stress management education;Encouraged to attend Cardiac Rehabilitation for the exercise;Relaxation education  Stress management education;Encouraged to attend Cardiac  Rehabilitation for the exercise;Relaxation education      Continue Psychosocial Services   No Follow up required  No Follow up required         Psychosocial Discharge (Final Psychosocial Re-Evaluation): Psychosocial Re-Evaluation - 08/30/19 1255      Psychosocial Re-Evaluation   Current issues with  None Identified    Comments  Patient's initial QOL score was 23.4 and his PHQ-9 score was 3 with no psychosocial issues identified. Will continue to monitor.    Expected Outcomes  Patient will have no psychosocial issues identified at discharge.    Interventions  Stress management education;Encouraged to attend Cardiac Rehabilitation for the exercise;Relaxation education    Continue Psychosocial  Services   No Follow up required       Vocational Rehabilitation: Provide vocational rehab assistance to qualifying candidates.   Vocational Rehab Evaluation & Intervention: Vocational Rehab - 07/13/19 1517      Initial Vocational Rehab Evaluation & Intervention   Assessment shows need for Vocational Rehabilitation  No      Vocational Rehab Re-Evaulation   Comments  Patient disabled.       Education: Education Goals: Education classes will be provided on a weekly basis, covering required topics. Participant will state understanding/return demonstration of topics presented.  Learning Barriers/Preferences: Learning Barriers/Preferences - 07/13/19 1516      Learning Barriers/Preferences   Learning Barriers  None    Learning Preferences  Written Material       Education Topics: Hypertension, Hypertension Reduction -Define heart disease and high blood pressure. Discus how high blood pressure affects the body and ways to reduce high blood pressure.   Exercise and Your Heart -Discuss why it is important to exercise, the FITT principles of exercise, normal and abnormal responses to exercise, and how to exercise safely.   Angina -Discuss definition of angina, causes of angina, treatment of angina, and how to decrease risk of having angina.   Cardiac Medications -Review what the following cardiac medications are used for, how they affect the body, and side effects that may occur when taking the medications.  Medications include Aspirin, Beta blockers, calcium channel blockers, ACE Inhibitors, angiotensin receptor blockers, diuretics, digoxin, and antihyperlipidemics.   CARDIAC REHAB PHASE II EXERCISE from 08/17/2019 in [place]  Date  07/20/19  Educator  Etheleen Mayhew  Instruction Review Code  2- Demonstrated Understanding      Congestive Heart Failure -Discuss the definition of CHF, how to live with CHF, the signs and symptoms of CHF, and how keep  track of weight and sodium intake.   CARDIAC REHAB PHASE II EXERCISE from 08/17/2019 in [place]  Date  07/27/19  Educator  DC  Instruction Review Code  2- Demonstrated Understanding      Heart Disease and Intimacy -Discus the effect sexual activity has on the heart, how changes occur during intimacy as we age, and safety during sexual activity.   CARDIAC REHAB PHASE II EXERCISE from 08/17/2019 in [place]  Date  08/10/19  Educator  DC  Instruction Review Code  2- Demonstrated Understanding      Smoking Cessation / COPD -Discuss different methods to quit smoking, the health benefits of quitting smoking, and the definition of COPD.   CARDIAC REHAB PHASE II EXERCISE from 08/17/2019 in [place]  Date  08/17/19  Educator  Etheleen Mayhew  Instruction Review Code  2- Demonstrated Understanding      Nutrition I: Fats -Discuss the types  of cholesterol, what cholesterol does to the heart, and how cholesterol levels can be controlled.   Nutrition II: Labels -Discuss the different components of food labels and how to read food label   Heart Parts/Heart Disease and PAD -Discuss the anatomy of the heart, the pathway of blood circulation through the heart, and these are affected by heart disease.   Stress I: Signs and Symptoms -Discuss the causes of stress, how stress may lead to anxiety and depression, and ways to limit stress.   Stress II: Relaxation -Discuss different types of relaxation techniques to limit stress.   Warning Signs of Stroke / TIA -Discuss definition of a stroke, what the signs and symptoms are of a stroke, and how to identify when someone is having stroke.   Knowledge Questionnaire Score: Knowledge Questionnaire Score - 07/13/19 1516      Knowledge Questionnaire Score   Pre Score  17/24       Core Components/Risk Factors/Patient Goals at Admission: Personal Goals and Risk Factors at  Admission - 07/13/19 1517      Core Components/Risk Factors/Patient Goals on Admission    Weight Management  Obesity    Hypertension  Yes    Intervention  Provide education on lifestyle modifcations including regular physical activity/exercise, weight management, moderate sodium restriction and increased consumption of fresh fruit, vegetables, and low fat dairy, alcohol moderation, and smoking cessation.;Monitor prescription use compliance.    Expected Outcomes  Short Term: Continued assessment and intervention until BP is < 140/47m HG in hypertensive participants. < 130/840mHG in hypertensive participants with diabetes, heart failure or chronic kidney disease.;Long Term: Maintenance of blood pressure at goal levels.    Personal Goal Other  Yes    Personal Goal  Do activities without fatigue[place]    Intervention  Patient will attend cardiac rehab 3 days/week and supplement with exercise at home 2 days/week.    Expected Outcomes  Patient will meet his personal goals.       Core Components/Risk Factors/Patient Goals Review:  Goals and Risk Factor Review    Row Name 08/09/19 1500 08/30/19 1250           Core Components/Risk Factors/Patient Goals Review   Personal Goals Review  Weight Management/Obesity;Other;Hypertension Do activities w/o fatigue; [place]  Weight Management/Obesity;Other;Hypertension Do activities w/o fatigue; [place] [employer]      Review  Patient has completed 10 sessions gaining 7 lbs since his initial visit. He is doing well in the program with progression. His blood pressure remains high inspite of medication adjustments made by Dr. McDomenic PoliteDr. McDomenic Politeas be notified of his hypertensive readings and says patient is supposed to come back for a blood pressure check and he wants to give the medication to work. Patient also has chronic back pain which prevents him from progresisng on the  treadmill. He does say he enjoys coming to the program and he feels somewhat stronger. He wants to continue in spite of his back pain. Will continue to monitor for progress.  Patine has completed 15 sessions gaining 6 lbs since last 30 day review. He is doing well in the program with some progression. His blood pressure continues to be elevated. He saw Dr. McMyles Gipurse for a blood pressue recheck and no additional adjustments were made due to his pcp increasing Norvasc to 10 mg daily. He is currently having issues with his  right lower arm and plans on seeeing a hand specialist soon. He says doing the exercise here makes his pain worse and he is going to talk to the specialist about whether he should continue. Will continue to monitor.      Expected Outcomes  Patient will continue to attend sessions and complete the program and continue to meet his personal goals.  Patient will continue to attend sessions and complete the program and continue to meet his personal goals.         Core Components/Risk Factors/Patient Goals at Discharge (Final Review):  Goals and Risk Factor Review - 08/30/19 1250      Core Components/Risk Factors/Patient Goals Review   Personal Goals Review  Weight Management/Obesity;Other;Hypertension   Do activities w/o fatigue; [place]   Review  Patine has completed 15 sessions gaining 6 lbs since last 30 day review. He is doing well in the program with some progression. His blood pressure continues to be elevated. He saw Dr. Myles Gip nurse for a blood pressue recheck and no additional adjustments were made due to his pcp increasing Norvasc to 10 mg daily. He is currently having issues with his right lower arm and plans on seeeing a hand specialist soon. He says doing the exercise here makes his pain worse and he is going to talk to the specialist about whether he should continue. Will continue to monitor.    Expected Outcomes  Patient will continue to  attend sessions and complete the program and continue to meet his personal goals.       ITP Comments: ITP Comments    Row Name 07/18/19 1518           ITP Comments  Patient new to program. He has completed 2 sessions. Will continue to monitor for progress.          Comments: ITP REVIEW Patient doing well in the program. Will wait for him to be evaluated by hand specialist to see if he will continue the program.

## 2019-08-31 ENCOUNTER — Encounter (HOSPITAL_COMMUNITY): Payer: Medicare Other

## 2019-09-02 ENCOUNTER — Encounter (HOSPITAL_COMMUNITY): Payer: Medicare Other

## 2019-09-03 ENCOUNTER — Other Ambulatory Visit: Payer: Self-pay | Admitting: Cardiothoracic Surgery

## 2019-09-05 ENCOUNTER — Encounter (HOSPITAL_COMMUNITY): Payer: Medicare Other

## 2019-09-07 ENCOUNTER — Encounter (HOSPITAL_COMMUNITY): Payer: Medicare Other

## 2019-09-07 ENCOUNTER — Other Ambulatory Visit: Payer: Self-pay | Admitting: Cardiothoracic Surgery

## 2019-09-09 ENCOUNTER — Encounter (HOSPITAL_COMMUNITY): Payer: Medicare Other

## 2019-09-12 ENCOUNTER — Encounter (HOSPITAL_COMMUNITY): Payer: Medicare Other

## 2019-09-14 ENCOUNTER — Encounter (HOSPITAL_COMMUNITY): Payer: Medicare Other

## 2019-09-14 NOTE — Progress Notes (Signed)
Discharge Progress Report  Patient Details  Name: [name] [name] MRN: [id number] Date of Birth: November 22, 1961 Referring Provider:     [name] from 07/13/2019 in [place]  Referring Provider  [name] [name]       Number of Visits: 15  Reason for Discharge:  Early Exit:  Personal  Smoking History:  Social History   Tobacco Use  Smoking Status Former Smoker  . Packs/day: 1.00  . Years: 4.00  . Pack years: 4.00  . Types: Cigarettes  . Quit date: 12/22/2018  . Years since quitting: 0.7  Smokeless Tobacco Never Used    Diagnosis:  S/P CABG x 1  ADL UCSD:   Initial Exercise Prescription: Initial Exercise Prescription - 07/13/19 1400      Date of Initial Exercise RX and Referring Provider   Date  07/13/19    Referring Provider  [name] [name]    Expected Discharge Date  10/13/19      Treadmill   MPH  1.4    Grade  0    Minutes  17    METs  2.07      Arm Ergometer   Level  1.5    Watts  14    RPM  20    Minutes  17    METs  1.9      Prescription Details   Frequency (times per week)  3    Duration  Progress to 30 minutes of continuous aerobic without signs/symptoms of physical distress      Intensity   THRR 40-80% of Max Heartrate  [id number]    Ratings of Perceived Exertion  11-13    Perceived Dyspnea  0-4      Progression   Progression  Continue to progress workloads to maintain intensity without signs/symptoms of physical distress.      Resistance Training   Training Prescription  Yes    Weight  1    Reps  10-15       Discharge Exercise Prescription (Final Exercise Prescription Changes): Exercise Prescription Changes - 08/30/19 1000      Response to Exercise   Blood Pressure (Admit)  152/82    Blood Pressure (Exercise)  160/80    Blood Pressure (Exit)  130/80    Heart Rate (Admit)  65 bpm    Heart Rate (Exercise)  94 bpm    Heart Rate (Exit)  73 bpm    Rating of Perceived Exertion (Exercise)  12     Comments  increase in overall MET  level     Duration  Continue with 30 min of aerobic exercise without signs/symptoms of physical distress.    Intensity  THRR unchanged      Progression   Progression  Continue to progress workloads to maintain intensity without signs/symptoms of physical distress.    Average METs  2.47      Resistance Training   Training Prescription  Yes    Weight  3    Reps  10-15      Treadmill   MPH  1.5    Grade  0    Minutes  17    METs  2.15      Arm Ergometer   Level  2.5    Watts  22    RPM  26    Minutes  17    METs  2.8      Home Exercise Plan   Plans to continue exercise at  Home (comment)  Frequency  Add 2 additional days to program exercise sessions.    Initial Home Exercises Provided  07/13/19       Functional Capacity: 6 Minute Walk    Row Name 07/13/19 1431         6 Minute Walk   Phase  Initial     Distance  400 feet     Walk Time  2 minutes had to stop     # of Rest Breaks  0     MPH  0.75     METS  1.58     RPE  13     Perceived Dyspnea   13     VO2 Peak  5.69     Symptoms  Yes (comment)     Comments  5/10 lower back pain and shortness of breath     Resting HR  72 bpm     Resting BP  158/92     Resting Oxygen Saturation   97 %     Exercise Oxygen Saturation  during 6 min walk  97 %     Max Ex. HR  94 bpm     Max Ex. BP  160/70     2 Minute Post BP  140/72        Psychological, QOL, Others - Outcomes: PHQ 2/9: Depression screen PHQ 2/9 07/13/2019  Decreased Interest 0  Down, Depressed, Hopeless 0  PHQ - 2 Score 0  Altered sleeping 0  Tired, decreased energy 1  Change in appetite 2  Feeling bad or failure about yourself  0  Trouble concentrating 0  Moving slowly or fidgety/restless 0  Suicidal thoughts 0  PHQ-9 Score 3  Difficult doing work/chores Not difficult at all  Some recent data might be hidden    Quality of Life: Quality of Life - 07/13/19 1436      Quality of Life   Select  Quality of Life       Quality of Life Scores   Health/Function Pre  19.17 %    Socioeconomic Pre  24.83 %    Psych/Spiritual Pre  27.43 %    Family Pre  28.8 %    GLOBAL Pre  23.41 %       Personal Goals: Goals established at orientation with interventions provided to work toward goal. Personal Goals and Risk Factors at Admission - 07/13/19 1517      Core Components/Risk Factors/Patient Goals on Admission    Weight Management  Obesity    Hypertension  Yes    Intervention  Provide education on lifestyle modifcations including regular physical activity/exercise, weight management, moderate sodium restriction and increased consumption of fresh fruit, vegetables, and low fat dairy, alcohol moderation, and smoking cessation.;Monitor prescription use compliance.    Expected Outcomes  Short Term: Continued assessment and intervention until BP is < 140/64m HG in hypertensive participants. < 130/822mHG in hypertensive participants with diabetes, heart failure or chronic kidney disease.;Long Term: Maintenance of blood pressure at goal levels.    Personal Goal Other  Yes    Personal Goal  Do activities without fatigue; [place]    Intervention  Patient will attend cardiac rehab 3 days/week and supplement with exercise at home 2 days/week.    Expected Outcomes  Patient will meet his personal goals.        Personal Goals Discharge: Goals and Risk Factor Review    Row Name 08/09/19 1500 08/30/19 1250  Core Components/Risk Factors/Patient Goals Review   Personal Goals Review  Weight Management/Obesity;Other;Hypertension Do activities w/o fatigue; [place] [employer]  Weight Management/Obesity;Other;Hypertension Do activities w/o fatigue; [place] [employer]      Review  Patient has completed 10 sessions gaining 7 lbs since his initial visit. He is doing well in the program with progression. His blood pressure remains high inspite of  medication adjustments made by Dr. Domenic [name]. Dr. Domenic [name] has be notified of his hypertensive readings and says patient is supposed to come back for a blood pressure check and he wants to give the medication to work. Patient also has chronic back pain which prevents him from progresisng on the treadmill. He does say he enjoys coming to the program and he feels somewhat stronger. He wants to continue in spite of his back pain. Will continue to monitor for progress.  Patine has completed 15 sessions gaining 6 lbs since last 30 day review. He is doing well in the program with some progression. His blood pressure continues to be elevated. He saw Dr. Myles Gip nurse for a blood pressue recheck and no additional adjustments were made due to his pcp increasing Norvasc to 10 mg daily. He is currently having issues with his right lower arm and plans on seeeing a hand specialist soon. He says doing the exercise here makes his pain worse and he is going to talk to the specialist about whether he should continue. Will continue to monitor.      Expected Outcomes  Patient will continue to attend sessions and complete the program and continue to meet his personal goals.  Patient will continue to attend sessions and complete the program and continue to meet his personal goals.         Exercise Goals and Review: Exercise Goals    Row Name 07/13/19 1434             Exercise Goals   Increase Physical Activity  Yes       Intervention  Develop an individualized exercise prescription for aerobic and resistive training based on initial evaluation findings, risk stratification, comorbidities and participant's personal goals.       Expected Outcomes  Short Term: Attend rehab on a regular basis to increase amount of physical activity.;Long Term: Add in home exercise to make exercise part of routine and to increase amount of physical activity.;Long Term: Exercising regularly at least 3-5 days a week.       Increase Strength  and Stamina  Yes       Intervention  Develop an individualized exercise prescription for aerobic and resistive training based on initial evaluation findings, risk stratification, comorbidities and participant's personal goals.       Expected Outcomes  Short Term: Increase workloads from initial exercise prescription for resistance, speed, and METs.;Short Term: Perform resistance training exercises routinely during rehab and add in resistance training at home;Long Term: Improve cardiorespiratory fitness, muscular endurance and strength as measured by increased METs and functional capacity (6MWT)       Able to understand and use rate of perceived exertion (RPE) scale  Yes       Intervention  Provide education and explanation on how to use RPE scale       Expected Outcomes  Short Term: Able to use RPE daily in rehab to express subjective intensity level;Long Term:  Able to use RPE to guide intensity level when exercising independently  Able to understand and use Dyspnea scale  Yes       Intervention  Provide education and explanation on how to use Dyspnea scale       Expected Outcomes  Short Term: Able to use Dyspnea scale daily in rehab to express subjective sense of shortness of breath during exertion;Long Term: Able to use Dyspnea scale to guide intensity level when exercising independently       Knowledge and understanding of Target Heart Rate Range (THRR)  Yes       Intervention  Provide education and explanation of THRR including how the numbers were predicted and where they are located for reference       Expected Outcomes  Short Term: Able to state/look up THRR;Long Term: Able to use THRR to govern intensity when exercising independently;Short Term: Able to use daily as guideline for intensity in rehab       Able to check pulse independently  Yes       Intervention  Provide education and demonstration on how to check pulse in carotid and radial arteries.;Review the importance of being able to  check your own pulse for safety during independent exercise       Expected Outcomes  Short Term: Able to explain why pulse checking is important during independent exercise;Long Term: Able to check pulse independently and accurately       Understanding of Exercise Prescription  Yes       Intervention  Provide education, explanation, and written materials on patient's individual exercise prescription       Expected Outcomes  Short Term: Able to explain program exercise prescription;Long Term: Able to explain home exercise prescription to exercise independently          Exercise Goals Re-Evaluation: Exercise Goals Re-Evaluation    Row Name 07/18/19 1329 08/09/19 0936 08/30/19 1029         Exercise Goal Re-Evaluation   Exercise Goals Review  Increase Physical Activity;Increase Strength and Stamina;Able to understand and use rate of perceived exertion (RPE) scale;Knowledge and understanding of Target Heart Rate Range (THRR);Able to check pulse independently;Understanding of Exercise Prescription  Increase Physical Activity;Increase Strength and Stamina;Able to understand and use rate of perceived exertion (RPE) scale;Knowledge and understanding of Target Heart Rate Range (THRR);Able to check pulse independently;Understanding of Exercise Prescription  Increase Physical Activity;Increase Strength and Stamina;Able to understand and use rate of perceived exertion (RPE) scale;Knowledge and understanding of Target Heart Rate Range (THRR);Able to check pulse independently;Understanding of Exercise Prescription     Comments  Today was Kaj's first complete exercise session. He was able to follow the prescription and tolerate all of the exercise.  Pt. has been consistant in Cardiac Rehab. He is eager to continue to work towards his goals and decrease his back pain.  Pt. has not been to CR since 08/22/2019. Until then he was regular and feeling more comfortable walking on the TM.     Expected Outcomes  Short:  increase activity level. Long: increase overall strength and stamina to be able to do more without getting winded.  Short: increase activity level. Long: increase overall strength and stamina to be able to do more without getting winded. Decrease amount of back pain.  Short: increase activity level. Long: increase overall strength and stamina to be able to do more without getting winded. Decrease amount of back pain.        Nutrition & Weight - Outcomes: Pre Biometrics - 07/13/19 1435      Pre Biometrics  Height  6' (1.829 m)    Waist Circumference  47.5 inches    Hip Circumference  46 inches    Waist to Hip Ratio  1.03 %    BMI (Calculated)  32.34    Triceps Skinfold  14 mm    % Body Fat  33.7 %    Grip Strength  8 kg    Flexibility  0 in   lower back surgery   Single Leg Stand  0 seconds        Nutrition: Nutrition Therapy & Goals - 08/30/19 1250      Personal Nutrition Goals   Comments  We have not resumed the RD meeting post COVID-19. We are working on resuming this safely and also working with a new dietician. In the interim, we are providing education through hand outs and one-on-one teaching. Patient says he is trying to eat a healthy diet. Will continue to monitor for progress.      Intervention Plan   Intervention  Nutrition handout(s) given to patient.       Nutrition Discharge: Nutrition Assessments - 07/13/19 1516      Rate Your Plate Scores   Pre Score  78       Education Questionnaire Score: Knowledge Questionnaire Score - 07/13/19 1516      Knowledge Questionnaire Score   Pre Score  17/24       Patient stopped coming to Cardiac Rehab on 08/22/19 due to arm pain exacerbated by exercise.  Doctor will be informed.

## 2019-09-16 ENCOUNTER — Encounter (HOSPITAL_COMMUNITY): Payer: Medicare Other

## 2019-09-19 ENCOUNTER — Encounter (HOSPITAL_COMMUNITY): Payer: Medicare Other

## 2019-09-19 ENCOUNTER — Telehealth: Payer: Self-pay | Admitting: Cardiology

## 2019-09-19 MED ORDER — METOPROLOL TARTRATE 50 MG PO TABS: 50.0000 mg | ORAL_TABLET | Freq: Two times a day (BID) | ORAL | 1 refills | Status: DC

## 2019-09-19 NOTE — Telephone Encounter (Signed)
Patient needs refill on Metoprolol but is questioning if he needs to continue taking it. / tg

## 2019-09-21 ENCOUNTER — Encounter (HOSPITAL_COMMUNITY): Payer: Medicare Other

## 2019-09-23 ENCOUNTER — Encounter (HOSPITAL_COMMUNITY): Payer: Medicare Other

## 2019-09-26 ENCOUNTER — Encounter (HOSPITAL_COMMUNITY): Payer: Medicare Other

## 2019-09-28 ENCOUNTER — Encounter (HOSPITAL_COMMUNITY): Payer: Medicare Other

## 2019-09-30 ENCOUNTER — Encounter (HOSPITAL_COMMUNITY): Payer: Medicare Other

## 2019-10-03 ENCOUNTER — Encounter (HOSPITAL_COMMUNITY): Payer: Medicare Other

## 2019-10-05 ENCOUNTER — Encounter (HOSPITAL_COMMUNITY): Payer: Medicare Other

## 2019-10-07 ENCOUNTER — Encounter (HOSPITAL_COMMUNITY): Payer: Medicare Other

## 2019-10-26 ENCOUNTER — Telehealth: Payer: Self-pay | Admitting: Cardiology

## 2019-10-26 NOTE — Telephone Encounter (Signed)

## 2019-10-28 ENCOUNTER — Telehealth (INDEPENDENT_AMBULATORY_CARE_PROVIDER_SITE_OTHER): Payer: Medicare Other | Admitting: Cardiology

## 2019-10-28 ENCOUNTER — Encounter: Payer: Self-pay | Admitting: Cardiology

## 2019-10-28 DIAGNOSIS — I71019 Dissection of thoracic aorta, unspecified: Secondary | ICD-10-CM

## 2019-10-28 DIAGNOSIS — E782 Mixed hyperlipidemia: Secondary | ICD-10-CM

## 2019-10-28 DIAGNOSIS — I25119 Atherosclerotic heart disease of native coronary artery with unspecified angina pectoris: Secondary | ICD-10-CM

## 2019-10-28 DIAGNOSIS — I10 Essential (primary) hypertension: Secondary | ICD-10-CM | POA: Diagnosis not present

## 2019-10-28 DIAGNOSIS — I7101 Dissection of thoracic aorta: Secondary | ICD-10-CM

## 2019-10-28 NOTE — Patient Instructions (Signed)
Medication Instructions:  Your physician recommends that you continue on your current medications as directed. Please refer to the Current Medication list given to you today.  *If you need a refill on your cardiac medications before your next appointment, please call your pharmacy*  Lab Work: None today If you have labs (blood work) drawn today and your tests are completely normal, you will receive your results only by: . MyChart Message (if you have MyChart) OR . A paper copy in the mail If you have any lab test that is abnormal or we need to change your treatment, we will call you to review the results.  Testing/Procedures: None today  Follow-Up: At [facility], you and your health needs are our priority.  As part of our continuing mission to provide you with exceptional heart care, we have created designated Provider Care Teams.  These Care Teams include your primary Cardiologist (physician) and Advanced Practice Providers (APPs -  Physician Assistants and Nurse Practitioners) who all work together to provide you with the care you need, when you need it.  Your next appointment:   6 months  The format for your next appointment:   In Person  Provider:   Samuel McDowell, MD  Other Instructions None     Thank you for choosing [facility] !         

## 2019-10-28 NOTE — Progress Notes (Signed)
Virtual Visit via Telephone Note   This visit type was conducted due to national recommendations for restrictions regarding the COVID-19 Pandemic (e.g. social distancing) in an effort to limit this patient's exposure and mitigate transmission in our community.  Due to his co-morbid illnesses, this patient is at least at moderate risk for complications without adequate follow up.  This format is felt to be most appropriate for this patient at this time.  The patient did not have access to video technology/had technical difficulties with video requiring transitioning to audio format only (telephone).  All issues noted in this document were discussed and addressed.  No physical exam could be performed with this format.  Please refer to the patient's chart for his  consent to telehealth for [facility].   Date:  10/28/2019   ID:  [name] [name], DOB 01-26-1961, MRN [id number]  Patient Location: Home Provider Location: Home  PCP:  Shirline Frees, MD  Cardiologist:  Rozann Lesches, MD Electrophysiologist:  None   Evaluation Performed:  Follow-Up Visit  Chief Complaint:   Cardiac follow-up  History of Present Illness:    STEPEHN [name] is a 58 y.o. male last seen in July.  We spoke by phone today.  He describes postsurgical thoracic discomfort, particular if he coughs or sneezes, no obvious chest instability by description.  He does not report any palpitations or syncope.  He tells me that he is in the process of evaluation by hand specialist in [facility] for possible right hand surgery related to neuropathic symptoms.  This has apparently not yet been scheduled.    I reviewed his medications today which are listed below.  He reports compliance.  Cardiac regimen includes aspirin, Norvasc, Lipitor, Cozaar, Lopressor, Aldactone, and as needed nitroglycerin.  He continues to see Dr. Kenton Kingfisher for primary care and will have physical and lab work done in February.  His last LDL was 105  but he has been on high-dose Lipitor and states that he is tolerating this well so far.  The patient does not have symptoms concerning for COVID-19 infection (fever, chills, cough, or new shortness of breath).    Past Medical History:  Diagnosis Date   Arthritis    Asthma    Barrett's esophagus 05/22/2015   Chronic back pain    COPD (chronic obstructive pulmonary disease) ([place])    Coronary artery disease    a. cath in 2010 showing 20% distal LM, 70-80% Ost LAD, 30% OM, and 40% RCA b. low-risk NST in 2014 and 2016 c. 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 Underwent repair of dissection and SVG-RCA.    GERD (gastroesophageal reflux disease)    Gout    Hemorrhoids    History of alcohol abuse    Remote   History of bronchitis 2015   History of kidney stones    Hyperlipidemia    Hypertension    Lymphocytic colitis 2009   Myocardial infarction [facility]) 2010   Admit for CP 11/2009; ruled out for MI but had 70-80% ostail LAD stenosis and treated medically following NL perfusion study)   NASH (nonalcoholic steatohepatitis)    Peripheral vascular disease ([place])    Personal history of colonic polyps 07/20/2002   Diminutive adenomas (3) 06/2002 diminutive adenoma (1) 2011    Pre-diabetes    Stroke [facility])    TIA (transient ischemic attack)    Urinary urgency    Past Surgical  History:  Procedure Laterality Date   25 GAUGE PARS PLANA VITRECTOMY WITH 20 GAUGE MVR PORT N/A 03/25/2019   Procedure: WOUND VAC CHANGE AND IRRIGATION;  Surgeon: Ivin Poot, MD;  Location: [place];  Service: Thoracic;  Laterality: N/A;   ANEURYSM COILING  12/2010   cerebral   APPLICATION OF WOUND VAC N/A 03/22/2019   Procedure: APPLICATION OF WOUND VAC;  Surgeon: Ivin Poot, MD;  Location: [place];  Service: Thoracic;  Laterality: N/A;   APPLICATION OF WOUND VAC N/A 03/29/2019   Procedure:  WOUND VAC CHANGE;  Surgeon: Ivin Poot, MD;  Location: [place];  Service: Thoracic;  Laterality: N/A;   APPLICATION OF WOUND VAC N/A 03/31/2019   Procedure: WOUND VAC CHANGE;  Surgeon: Ivin Poot, MD;  Location: [facility] OR;  Service: Thoracic;  Laterality: N/A;   BAND HEMORRHOIDECTOMY  2003   at sigmoidoscopy   CARDIAC CATHETERIZATION  2010   CHOLECYSTECTOMY  08/03/2015   CHOLECYSTECTOMY N/A 08/03/2015   Procedure: LAPAROSCOPIC CHOLECYSTECTOMY WITH INTRAOPERATIVE CHOLANGIOGRAM;  Surgeon: Fanny Skates, MD;  Location: [place];  Service: General;  Laterality: N/A;   COLONOSCOPY  multiple   CORONARY ARTERY BYPASS GRAFT N/A 02/23/2019   Procedure: CORONARY ARTERY BYPASS GRAFTING (CABG) x 1; Using Endoscopically Harvested Right Leg Greater Saphenous Vein Graft (SVG); SVG to RCA;  Surgeon: Ivin Poot, MD;  Location: [place];  Service: Open Heart Surgery;  Laterality: N/A;   CORONARY STENT INTERVENTION N/A 02/23/2019   Procedure: CORONARY STENT INTERVENTION;  Surgeon: Martinique, Peter M, MD;  Location: [place] CV LAB;  Service: Cardiovascular;  Laterality: N/A;   ESOPHAGOGASTRODUODENOSCOPY     KNEE ARTHROSCOPY WITH MEDIAL MENISECTOMY Left 05/20/2017   Procedure: LEFT KNEE ARTHROSCOPY WITH PARTIAL MEDIAL MENISCECTOMY;  Surgeon: Leandrew Koyanagi, MD;  Location: [place];  Service: Orthopedics;  Laterality: Left;   LEFT HEART CATH AND CORONARY ANGIOGRAPHY N/A 02/23/2019   Procedure: LEFT HEART CATH AND CORONARY ANGIOGRAPHY;  Surgeon: Martinique, Peter M, MD;  Location: [place] CV LAB;  Service: Cardiovascular;  Laterality: N/A;   LUMBAR SPINE SURGERY     x 2   NASAL SINUS SURGERY     PECTORALIS FLAP N/A 04/04/2019   Procedure: right pectoralis muscle flap for sternal wound reconstruction and VAC placement;  Surgeon: Wallace Going, DO;  Location: [place];  Service: Plastics;  Laterality: N/A;  2.5 hours case length   REPAIR OF ACUTE ASCENDING THORACIC AORTIC DISSECTION  N/A 02/23/2019   Procedure: REPAIR OF TYPE A  - ACUTE ASCENDING THORACIC AORTIC DISSECTION, using Hemashield Platinum Woven Double Velour Vascular Graft (D: 74m, L: 30cm);  Surgeon: VPrescott Gum PCollier Salina MD;  [place]  Service: Vascular;  Laterality: N/A;   STERNAL WOUND DEBRIDEMENT N/A 03/22/2019   Procedure: STERNAL WOUND DEBRIDEMENT;  Surgeon: VIvin Poot MD;  [place]  Service: Thoracic;  Laterality: N/A;   STERNAL WOUND DEBRIDEMENT N/A 03/25/2019   Procedure: SUPERFICIAL STERNAL WOUND DEBRIDEMENT;  Surgeon: VIvin Poot MD;  [place]  Service: Thoracic;  Laterality: N/A;   STERNAL WOUND DEBRIDEMENT N/A 03/29/2019   Procedure: SUPERFICIAL STERNAL WOUND DEBRIDEMENT;  Surgeon: VIvin Poot MD;  [place]  Service: Thoracic;  Laterality: N/A;   STERNAL WOUND DEBRIDEMENT N/A 03/31/2019   Procedure: STERNAL WOUND DEBRIDEMENT USING 1G ACEL POWDER;  Surgeon: VIvin Poot MD;  [place]  Service: Thoracic;  Laterality: N/A;   STERNAL WOUND DEBRIDEMENT N/A 04/04/2019  Procedure: STERNAL WOUND DEBRIDEMENT;  Surgeon: Ivin Poot, MD;  Location: [place];  Service: Thoracic;  Laterality: N/A;   TEE WITHOUT CARDIOVERSION  05/28/2012   Procedure: TRANSESOPHAGEAL ECHOCARDIOGRAM (TEE);  Surgeon: Lelon Perla, MD;  Location: [facility] ENDOSCOPY;  Service: Cardiovascular;  Laterality: N/A;   TEE WITHOUT CARDIOVERSION N/A 02/23/2019   Procedure: TRANSESOPHAGEAL ECHOCARDIOGRAM (TEE);  Surgeon: Prescott Gum, Collier Salina, MD;  Location: [place];  Service: Open Heart Surgery;  Laterality: N/A;     Current Meds  Medication Sig   albuterol (PROVENTIL HFA;VENTOLIN HFA) 108 (90 Base) MCG/ACT inhaler Inhale 1 puff into the lungs every 6 (six) hours as needed for wheezing or shortness of breath.    allopurinol (ZYLOPRIM) 100 MG tablet Take 100 mg by mouth daily.    amLODipine (NORVASC) 10 MG tablet Take 10 mg by mouth daily.   aspirin EC 81 MG EC tablet Take 1 tablet (81  mg total) by mouth daily.   atorvastatin (LIPITOR) 80 MG tablet Take 1 tablet (80 mg total) by mouth daily at 6 PM.   esomeprazole (NEXIUM) 40 MG capsule Take 40 mg by mouth 2 (two) times daily.   HYDROcodone-acetaminophen (NORCO/VICODIN) 5-325 MG tablet Take 1 tablet by mouth 2 (two) times daily as needed.   ipratropium-albuterol (DUONEB) 0.5-2.5 (3) MG/3ML SOLN Take 3 mLs by nebulization every 8 (eight) hours as needed (wheezing, shortness of breath).   losartan (COZAAR) 100 MG tablet Take 1 tablet (100 mg total) by mouth daily.   metoprolol tartrate (LOPRESSOR) 50 MG tablet Take 1 tablet (50 mg total) by mouth 2 (two) times daily.   milk thistle 175 MG tablet Take 175 mg by mouth daily.   Multiple Vitamin (MULITIVITAMIN WITH MINERALS) TABS Take 1 tablet by mouth daily.   naproxen (NAPROSYN) 500 MG tablet Take 500 mg by mouth 2 (two) times daily.   nitroGLYCERIN (NITROSTAT) 0.4 MG SL tablet Place 0.4 mg under the tongue every 5 (five) minutes as needed. For chest pain   Omega-3 Fatty Acids (FISH OIL) 1200 MG CPDR Take 1,200 mg by mouth daily.   spironolactone (ALDACTONE) 25 MG tablet Take 0.5 tablets (12.5 mg total) by mouth daily.   traZODone (DESYREL) 100 MG tablet Take 100 mg by mouth at bedtime.      Allergies:   Penicillins   Social History   Tobacco Use   Smoking status: Former Smoker    Packs/day: 1.00    Years: 4.00    Pack years: 4.00    Types: Cigarettes    Quit date: 12/22/2018    Years since quitting: 0.8   Smokeless tobacco: Never Used  Substance Use Topics   Alcohol use: No    Alcohol/week: 0.0 standard drinks    Comment: no alcohol in 31yr    Drug use: No     Family Hx: The patient's family history includes Breast cancer in his mother; Cancer in his father and mother; Diabetes in his paternal grandmother and sister; Heart attack in his father and mother; Heart disease in his father and another family member; Hyperlipidemia in his father and  sister; Hypertension in his brother, father, mother, and sister; Liver cancer in his mother; Liver disease in his mother. There is no history of Colon cancer.  ROS:   Please see the history of present illness. All other systems reviewed and are negative.   Prior CV studies:   The following studies were reviewed today:  Echocardiogram 03/28/2019: 1. The left ventricle has low normal systolic  function, with an ejection fraction of 50-55%. The cavity size was normal. 2. Left atrial size was moderately dilated. 3. Small pericardial effusion. 4. Small mostly apical pericardial effusion no signs of tamponade. 5. Mild thickening of the mitral valve leaflet. Mild calcification of the mitral valve leaflet. 6. Tricuspid valve regurgitation is mild-moderate. 7. The aortic valve is tricuspid Mild thickening of the aortic valve Mild calcification of the aortic valve. Aortic valve regurgitation is trivial by color flow Doppler. 8. The aortic root is normal in size and structure. 9. The interatrial septum was not assessed.  Cardiac catheterization and PCI 02/23/2019:  Dist LM to Ost LAD lesion is 45% stenosed.  Prox Cx to Mid Cx lesion is 30% stenosed.  Prox LAD lesion is 30% stenosed.  Prox RCA lesion is 90% stenosed.  Post intervention, there is a 0% residual stenosis.  A drug-eluting stent was successfully placed using a STENT SYNERGY DES 2.5X16.  A drug-eluting stent was successfully placed using a STENT SYNERGY DES 2.75X12.  The left ventricular systolic function is normal.  LV end diastolic pressure is normal.  The left ventricular ejection fraction is 55-65% by visual estimate.  1. Single vessel obstructive CAD involving the proximal RCA 2. Normal LV function 3. Normal LVEDP 4. Successful stenting of the proximal RCA with DES x 2 5. 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   Plan: emergent Aortic CT angio. Dr. Prescott Gum to  evaluate. Will hold Brilinta until status of dissection is clarified.   Labs/Other Tests and Data Reviewed:    EKG:  An ECG dated 03/18/2019 was personally reviewed today and demonstrated:  Sinus tachycardia with right bundle branch block and frequent PACs.  Recent Labs: 02/27/2019: TSH 4.862 03/18/2019: B Natriuretic Peptide 280.2 03/28/2019: Magnesium 1.6 04/06/2019: ALT 17; BUN 26; Creatinine, Ser 0.85; Potassium 3.9; Sodium 134 04/07/2019: Hemoglobin 8.6; Platelets 381   Recent Lipid Panel Lab Results  Component Value Date/Time   CHOL 156 02/23/2019 01:14 PM   TRIG 101 02/23/2019 01:14 PM   HDL 31 (L) 02/23/2019 01:14 PM   CHOLHDL 5.0 02/23/2019 01:14 PM   LDLCALC 105 (H) 02/23/2019 01:14 PM    Wt Readings from Last 3 Encounters:  10/28/19 231 lb (104.8 kg)  08/22/19 248 lb (112.5 kg)  07/22/19 243 lb (110.2 kg)     Objective:    Vital Signs:  BP (!) 168/86    Pulse 83    Ht 5' 8"  (1.727 m)    Wt 231 lb (104.8 kg)    BMI 35.12 kg/m    Patient spoke in full sentences, not short of breath. No audible wheezing or coughing. Speech pattern normal.  ASSESSMENT & PLAN:    1.  CAD status post DES x2 to the proximal RCA in February by Dr. Martinique.  Patient is status post subsequent SVG to RCA at the time of surgical repair of catheter related ascending aortic dissection.  Plan to continue medical therapy which is outlined above.  He reports no intolerances.  Continues on aspirin, statin, beta-blocker, ARB, and Norvasc.  2.  Patient reported pending right hand surgery, details of anesthesia not known at this time.  He is over 6 months out from cardiac surgery and should be able to proceed at an acceptable perioperative cardiac risk.  3.  Mixed hyperlipidemia, tolerating high-dose Lipitor.  I recommended that he continue to follow with PCP and have repeat lipid panel with goal LDL under 70.   4.  Essential hypertension, blood pressure is elevated today.  He is on metoprolol, Norvasc,  ARB, and Aldactone.  I have asked him to keep an eye on this and continue to work with PCP.  May need additional medication adjustments.  5.  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  Postoperative course complicated by pericardial effusion and sternal wound dehiscence.   COVID-19 Education: The signs and symptoms of COVID-19 were discussed with the patient and how to seek care for testing (follow up with PCP or arrange E-visit).  The importance of social distancing was discussed today.  Time:   Today, I have spent 8 minutes with the patient with telehealth technology discussing the above problems.      Medication Adjustments/Labs and Tests Ordered: Current medicines are reviewed at length with the patient today.  Concerns regarding medicines are outlined above.   Tests Ordered: No orders of the defined types were placed in this encounter.   Medication Changes: No orders of the defined types were placed in this encounter.   Follow Up:  In Person 6 months in the [place] office.  Signed, Rozann Lesches, MD  10/28/2019 11:01 AM    [place]

## 2019-11-09 ENCOUNTER — Other Ambulatory Visit: Payer: Self-pay

## 2019-11-09 ENCOUNTER — Encounter (HOSPITAL_BASED_OUTPATIENT_CLINIC_OR_DEPARTMENT_OTHER): Payer: Self-pay | Admitting: *Deleted

## 2019-11-09 NOTE — Progress Notes (Signed)
Patient's chart with extensive history reviewed with Dr Oren Bracket, states OK for [facility] with nerve block.

## 2019-11-10 ENCOUNTER — Other Ambulatory Visit (HOSPITAL_COMMUNITY)
Admission: RE | Admit: 2019-11-10 | Discharge: 2019-11-10 | Disposition: A | Discharge status: Home / Self Care | Payer: Medicare Other | Source: Ambulatory Visit | Attending: Orthopaedic Surgery | Admitting: Orthopaedic Surgery

## 2019-11-10 ENCOUNTER — Encounter (HOSPITAL_BASED_OUTPATIENT_CLINIC_OR_DEPARTMENT_OTHER)
Admission: RE | Admit: 2019-11-10 | Discharge: 2019-11-10 | Disposition: A | Discharge status: Home / Self Care | Payer: Medicare Other | Source: Ambulatory Visit | Attending: Orthopaedic Surgery | Admitting: Orthopaedic Surgery

## 2019-11-10 DIAGNOSIS — Z01812 Encounter for preprocedural laboratory examination: Secondary | ICD-10-CM | POA: Insufficient documentation

## 2019-11-10 DIAGNOSIS — Z20828 Contact with and (suspected) exposure to other viral communicable diseases: Secondary | ICD-10-CM | POA: Diagnosis not present

## 2019-11-10 LAB — BASIC METABOLIC PANEL
Anion gap: 12 (ref 5–15)
BUN: 11 mg/dL (ref 6–20)
CO2: 20 mmol/L — ABNORMAL LOW (ref 22–32)
Calcium: 9.1 mg/dL (ref 8.9–10.3)
Chloride: 106 mmol/L (ref 98–111)
Creatinine, Ser: 0.7 mg/dL (ref 0.61–1.24)
GFR calc Af Amer: >60 mL/min (ref >60)
GFR calc non Af Amer: >60 mL/min (ref >60)
Glucose, Bld: 181 mg/dL — ABNORMAL HIGH (ref 70–99)
Potassium: 3.9 mmol/L (ref 3.5–5.1)
Sodium: 138 mmol/L (ref 135–145)

## 2019-11-10 NOTE — Progress Notes (Signed)

## 2019-11-11 LAB — NOVEL CORONAVIRUS, NAA (HOSP ORDER, SEND-OUT TO REF LAB; TAT 18-24 HRS): SARS-CoV-2, NAA: NOT DETECTED

## 2019-11-13 NOTE — Anesthesia Preprocedure Evaluation (Addendum)
Anesthesia Evaluation  Patient identified by MRN, date of birth, ID band Patient awake    Reviewed: Allergy & Precautions, NPO status , Patient's Chart, lab work & pertinent test results  Airway Mallampati: I  TM Distance: >3 FB Neck ROM: Full    Dental no notable dental hx. (+) Edentulous Upper, Poor Dentition   Pulmonary COPD, former smoker,    Pulmonary exam normal breath sounds clear to auscultation       Cardiovascular hypertension, Pt. on medications and Pt. on home beta blockers + CAD and +CHF  Normal cardiovascular exam Rhythm:Regular Rate:Normal     Neuro/Psych TIA   GI/Hepatic GERD  Medicated,  Endo/Other  negative endocrine ROS  Renal/GU negative Renal ROS     Musculoskeletal   Abdominal   Peds  Hematology negative hematology ROS (+)   Anesthesia Other Findings   Reproductive/Obstetrics                           Anesthesia Physical Anesthesia Plan  ASA: III  Anesthesia Plan: MAC and Regional   Post-op Pain Management:  Regional for Post-op pain   Induction: Intravenous  PONV Risk Score and Plan: 2 and Treatment may vary due to age or medical condition, Ondansetron and Dexamethasone  Airway Management Planned: Natural Airway and Nasal Cannula  Additional Equipment: None  Intra-op Plan:   Post-operative Plan:   Informed Consent: I have reviewed the patients History and Physical, chart, labs and discussed the procedure including the risks, benefits and alternatives for the proposed anesthesia with the patient or authorized representative who has indicated his/her understanding and acceptance.     Dental advisory given  Plan Discussed with: CRNA  Anesthesia Plan Comments: (Mac w R Supraclavicular block)      Anesthesia Quick Evaluation

## 2019-11-14 ENCOUNTER — Ambulatory Visit (HOSPITAL_BASED_OUTPATIENT_CLINIC_OR_DEPARTMENT_OTHER): Payer: Medicare Other | Admitting: Anesthesiology

## 2019-11-14 ENCOUNTER — Ambulatory Visit (HOSPITAL_BASED_OUTPATIENT_CLINIC_OR_DEPARTMENT_OTHER)
Admission: RE | Admit: 2019-11-14 | Discharge: 2019-11-14 | Disposition: A | Discharge status: Home / Self Care | Payer: Medicare Other | Attending: Orthopaedic Surgery | Admitting: Orthopaedic Surgery

## 2019-11-14 ENCOUNTER — Other Ambulatory Visit: Payer: Self-pay

## 2019-11-14 ENCOUNTER — Encounter (HOSPITAL_BASED_OUTPATIENT_CLINIC_OR_DEPARTMENT_OTHER): Payer: Self-pay

## 2019-11-14 ENCOUNTER — Encounter (HOSPITAL_BASED_OUTPATIENT_CLINIC_OR_DEPARTMENT_OTHER)
Admission: RE | Disposition: A | Discharge status: Home / Self Care | Payer: Self-pay | Source: Home / Self Care | Attending: Orthopaedic Surgery

## 2019-11-14 DIAGNOSIS — Z87891 Personal history of nicotine dependence: Secondary | ICD-10-CM | POA: Diagnosis not present

## 2019-11-14 DIAGNOSIS — Z7982 Long term (current) use of aspirin: Secondary | ICD-10-CM | POA: Diagnosis not present

## 2019-11-14 DIAGNOSIS — G5601 Carpal tunnel syndrome, right upper limb: Secondary | ICD-10-CM | POA: Insufficient documentation

## 2019-11-14 DIAGNOSIS — G5621 Lesion of ulnar nerve, right upper limb: Secondary | ICD-10-CM | POA: Insufficient documentation

## 2019-11-14 DIAGNOSIS — Z955 Presence of coronary angioplasty implant and graft: Secondary | ICD-10-CM | POA: Insufficient documentation

## 2019-11-14 DIAGNOSIS — Z791 Long term (current) use of non-steroidal anti-inflammatories (NSAID): Secondary | ICD-10-CM | POA: Diagnosis not present

## 2019-11-14 DIAGNOSIS — Z79899 Other long term (current) drug therapy: Secondary | ICD-10-CM | POA: Diagnosis not present

## 2019-11-14 DIAGNOSIS — J449 Chronic obstructive pulmonary disease, unspecified: Secondary | ICD-10-CM | POA: Diagnosis not present

## 2019-11-14 DIAGNOSIS — Z8673 Personal history of transient ischemic attack (TIA), and cerebral infarction without residual deficits: Secondary | ICD-10-CM | POA: Insufficient documentation

## 2019-11-14 DIAGNOSIS — E785 Hyperlipidemia, unspecified: Secondary | ICD-10-CM | POA: Diagnosis not present

## 2019-11-14 DIAGNOSIS — K219 Gastro-esophageal reflux disease without esophagitis: Secondary | ICD-10-CM | POA: Diagnosis not present

## 2019-11-14 DIAGNOSIS — M109 Gout, unspecified: Secondary | ICD-10-CM | POA: Diagnosis not present

## 2019-11-14 DIAGNOSIS — R7303 Prediabetes: Secondary | ICD-10-CM | POA: Diagnosis not present

## 2019-11-14 DIAGNOSIS — I739 Peripheral vascular disease, unspecified: Secondary | ICD-10-CM | POA: Insufficient documentation

## 2019-11-14 DIAGNOSIS — I2510 Atherosclerotic heart disease of native coronary artery without angina pectoris: Secondary | ICD-10-CM | POA: Diagnosis not present

## 2019-11-14 DIAGNOSIS — M1990 Unspecified osteoarthritis, unspecified site: Secondary | ICD-10-CM | POA: Insufficient documentation

## 2019-11-14 HISTORY — PX: CARPAL TUNNEL RELEASE: SHX101

## 2019-11-14 SURGERY — CARPAL TUNNEL RELEASE
Anesthesia: Monitor Anesthesia Care | Site: Arm Lower | Laterality: Right

## 2019-11-14 MED ORDER — CLINDAMYCIN PHOSPHATE 900 MG/50ML IV SOLN
INTRAVENOUS | Status: AC
  Filled 2019-11-14: qty 50

## 2019-11-14 MED ORDER — FENTANYL CITRATE (PF) 100 MCG/2ML IJ SOLN
INTRAMUSCULAR | Status: AC
  Filled 2019-11-14: qty 2

## 2019-11-14 MED ORDER — HYDROCODONE-ACETAMINOPHEN 5-325 MG PO TABS: 1.0000 | ORAL_TABLET | Freq: Four times a day (QID) | ORAL | 0 refills | Status: AC | PRN

## 2019-11-14 MED ORDER — MIDAZOLAM HCL 2 MG/2ML IJ SOLN
INTRAMUSCULAR | Status: AC
  Filled 2019-11-14: qty 2

## 2019-11-14 MED ORDER — CLONIDINE HCL (ANALGESIA) 100 MCG/ML EP SOLN
EPIDURAL | Status: DC | PRN
  Administered 2019-11-14: 100 ug

## 2019-11-14 MED ORDER — ROPIVACAINE HCL 5 MG/ML IJ SOLN
INTRAMUSCULAR | Status: DC | PRN
  Administered 2019-11-14: 30 mL via PERINEURAL

## 2019-11-14 MED ORDER — PROPOFOL 10 MG/ML IV BOLUS
INTRAVENOUS | Status: AC
  Filled 2019-11-14: qty 40

## 2019-11-14 MED ORDER — MIDAZOLAM HCL 2 MG/2ML IJ SOLN
1.0000 mg | INTRAMUSCULAR | Status: DC | PRN
  Administered 2019-11-14: 2 mg via INTRAVENOUS
  Administered 2019-11-14: 08:00:00 1 mg via INTRAVENOUS

## 2019-11-14 MED ORDER — CLINDAMYCIN PHOSPHATE 900 MG/50ML IV SOLN
900.0000 mg | INTRAVENOUS | Status: AC
  Administered 2019-11-14: 900 mg via INTRAVENOUS

## 2019-11-14 MED ORDER — ACETAMINOPHEN 10 MG/ML IV SOLN: 1000.0000 mg | Freq: Once | INTRAVENOUS | Status: DC | PRN

## 2019-11-14 MED ORDER — PHENYLEPHRINE HCL (PRESSORS) 10 MG/ML IV SOLN
INTRAVENOUS | Status: AC
  Filled 2019-11-14: qty 2

## 2019-11-14 MED ORDER — LIDOCAINE 2% (20 MG/ML) 5 ML SYRINGE
INTRAMUSCULAR | Status: AC
  Filled 2019-11-14: qty 5

## 2019-11-14 MED ORDER — LIDOCAINE HCL (PF) 1 % IJ SOLN
INTRAMUSCULAR | Status: DC | PRN
  Administered 2019-11-14: 15 mL

## 2019-11-14 MED ORDER — SUCCINYLCHOLINE CHLORIDE 200 MG/10ML IV SOSY
PREFILLED_SYRINGE | INTRAVENOUS | Status: AC
  Filled 2019-11-14: qty 10

## 2019-11-14 MED ORDER — FENTANYL CITRATE (PF) 100 MCG/2ML IJ SOLN
50.0000 ug | INTRAMUSCULAR | Status: DC | PRN
  Administered 2019-11-14: 50 ug via INTRAVENOUS

## 2019-11-14 MED ORDER — ONDANSETRON HCL 4 MG/2ML IJ SOLN
INTRAMUSCULAR | Status: DC | PRN
  Administered 2019-11-14: 4 mg via INTRAVENOUS

## 2019-11-14 MED ORDER — CHLORHEXIDINE GLUCONATE 4 % EX LIQD: 60.0000 mL | Freq: Once | CUTANEOUS | Status: DC

## 2019-11-14 MED ORDER — ONDANSETRON HCL 4 MG/2ML IJ SOLN
INTRAMUSCULAR | Status: AC
  Filled 2019-11-14: qty 2

## 2019-11-14 MED ORDER — ONDANSETRON HCL 4 MG/2ML IJ SOLN: 4.0000 mg | Freq: Once | INTRAMUSCULAR | Status: DC | PRN

## 2019-11-14 MED ORDER — PROPOFOL 500 MG/50ML IV EMUL
INTRAVENOUS | Status: DC | PRN
  Administered 2019-11-14: 25 ug/kg/min via INTRAVENOUS

## 2019-11-14 MED ORDER — LIDOCAINE HCL (PF) 1 % IJ SOLN
INTRAMUSCULAR | Status: AC
  Filled 2019-11-14: qty 30

## 2019-11-14 MED ORDER — BUPIVACAINE HCL (PF) 0.5 % IJ SOLN
INTRAMUSCULAR | Status: AC
  Filled 2019-11-14: qty 30

## 2019-11-14 MED ORDER — HYDROCODONE-ACETAMINOPHEN 7.5-325 MG PO TABS: 1.0000 | ORAL_TABLET | Freq: Once | ORAL | Status: DC | PRN

## 2019-11-14 MED ORDER — LACTATED RINGERS IV SOLN
INTRAVENOUS | Status: DC
  Administered 2019-11-14: 07:00:00 via INTRAVENOUS

## 2019-11-14 MED ORDER — PHENYLEPHRINE 40 MCG/ML (10ML) SYRINGE FOR IV PUSH (FOR BLOOD PRESSURE SUPPORT)
PREFILLED_SYRINGE | INTRAVENOUS | Status: AC
  Filled 2019-11-14: qty 10

## 2019-11-14 MED ORDER — HYDROMORPHONE HCL 1 MG/ML IJ SOLN: 0.2500 mg | INTRAMUSCULAR | Status: DC | PRN

## 2019-11-14 MED ORDER — MEPERIDINE HCL 25 MG/ML IJ SOLN: 6.2500 mg | INTRAMUSCULAR | Status: DC | PRN

## 2019-11-14 MED ORDER — LIDOCAINE HCL (CARDIAC) PF 100 MG/5ML IV SOSY
PREFILLED_SYRINGE | INTRAVENOUS | Status: DC | PRN
  Administered 2019-11-14: 50 mg via INTRAVENOUS

## 2019-11-14 SURGICAL SUPPLY — 44 items
BLADE SURG 15 STRL LF DISP TIS (BLADE) ×2 IMPLANT
BLADE SURG 15 STRL SS (BLADE) ×4
BNDG COHESIVE 3X5 TAN STRL LF (GAUZE/BANDAGES/DRESSINGS) ×3 IMPLANT
BNDG ESMARK 4X9 LF (GAUZE/BANDAGES/DRESSINGS) ×3 IMPLANT
BNDG GAUZE ELAST 4 BULKY (GAUZE/BANDAGES/DRESSINGS) ×3 IMPLANT
BRUSH SCRUB EZ PLAIN DRY (MISCELLANEOUS) IMPLANT
CHLORAPREP W/TINT 26 (MISCELLANEOUS) ×3 IMPLANT
CORD BIPOLAR FORCEPS 12FT (ELECTRODE) ×3 IMPLANT
COVER BACK TABLE REUSABLE LG (DRAPES) ×3 IMPLANT
COVER WAND RF STERILE (DRAPES) IMPLANT
CUFF TOURN SGL QUICK 18X4 (TOURNIQUET CUFF) IMPLANT
DRAIN PENROSE 1/4X12 LTX STRL (WOUND CARE) IMPLANT
DRAPE EXTREMITY T 121X128X90 (DISPOSABLE) ×3 IMPLANT
DRAPE HALF SHEET 70X43 (DRAPES) ×3 IMPLANT
DRAPE SURG 17X23 STRL (DRAPES) ×3 IMPLANT
DRSG EMULSION OIL 3X3 NADH (GAUZE/BANDAGES/DRESSINGS) ×3 IMPLANT
GAUZE 4X4 16PLY RFD (DISPOSABLE) IMPLANT
GAUZE SPONGE 4X4 12PLY STRL (GAUZE/BANDAGES/DRESSINGS) IMPLANT
GAUZE XEROFORM 1X8 LF (GAUZE/BANDAGES/DRESSINGS) ×3 IMPLANT
GLOVE BIOGEL PI IND STRL 8 (GLOVE) ×1 IMPLANT
GLOVE BIOGEL PI INDICATOR 8 (GLOVE) ×2
GLOVE SURG SYN 7.5  E (GLOVE) ×2
GLOVE SURG SYN 7.5 E (GLOVE) ×1 IMPLANT
GOWN STRL REUS W/ TWL LRG LVL3 (GOWN DISPOSABLE) ×2 IMPLANT
GOWN STRL REUS W/TWL LRG LVL3 (GOWN DISPOSABLE) ×4
KNIFE CARPAL TUNNEL (BLADE) ×3 IMPLANT
LOOP VESSEL MAXI BLUE (MISCELLANEOUS) IMPLANT
NDL SAFETY ECLIPSE 18X1.5 (NEEDLE) ×1 IMPLANT
NEEDLE HYPO 18GX1.5 SHARP (NEEDLE) ×2
NEEDLE HYPO 22GX1.5 SAFETY (NEEDLE) IMPLANT
NEEDLE HYPO 25X1 1.5 SAFETY (NEEDLE) ×6 IMPLANT
NS IRRIG 1000ML POUR BTL (IV SOLUTION) ×3 IMPLANT
PACK BASIN DAY SURGERY FS (CUSTOM PROCEDURE TRAY) ×3 IMPLANT
PAD ALCOHOL SWAB (MISCELLANEOUS) ×24 IMPLANT
PAD CAST 3X4 CTTN HI CHSV (CAST SUPPLIES) ×2 IMPLANT
PADDING CAST ABS 4INX4YD NS (CAST SUPPLIES) ×2
PADDING CAST ABS COTTON 4X4 ST (CAST SUPPLIES) ×1 IMPLANT
PADDING CAST COTTON 3X4 STRL (CAST SUPPLIES) ×4
SUT PROLENE 4 0 PS 2 18 (SUTURE) ×3 IMPLANT
SYR BULB 3OZ (MISCELLANEOUS) ×3 IMPLANT
SYR CONTROL 10ML LL (SYRINGE) ×6 IMPLANT
TOWEL GREEN STERILE FF (TOWEL DISPOSABLE) ×3 IMPLANT
TRAY DSU PREP LF (CUSTOM PROCEDURE TRAY) ×3 IMPLANT
UNDERPAD 30X36 HEAVY ABSORB (UNDERPADS AND DIAPERS) ×3 IMPLANT

## 2019-11-14 NOTE — Anesthesia Postprocedure Evaluation (Signed)
Anesthesia Post Note  Patient: [name] [name]  Procedure(s) Performed: Right carpal tunnel release, Right cubital tunnel release (Right Arm Lower)     Patient location during evaluation: PACU Anesthesia Type: Regional Level of consciousness: awake and alert Pain management: pain level controlled Vital Signs Assessment: post-procedure vital signs reviewed and stable Respiratory status: spontaneous breathing, nonlabored ventilation, respiratory function stable and patient connected to nasal cannula oxygen Cardiovascular status: stable and blood pressure returned to baseline Postop Assessment: no apparent nausea or vomiting Anesthetic complications: no    Last Vitals:  Vitals:   11/14/19 0830 11/14/19 0905  BP: 115/71 118/74  Pulse: 68 64  Resp: 18 16  Temp: 36.4 C 36.5 C  SpO2: 96% 96%    Last Pain:  Vitals:   11/14/19 0905  PainSc: 0-No pain                 Barnet Glasgow

## 2019-11-14 NOTE — Anesthesia Procedure Notes (Addendum)
Anesthesia Regional Block: Supraclavicular block   Pre-Anesthetic Checklist: ,, timeout performed, Correct Patient, Correct Site, Correct Laterality, Correct Procedure, Correct Position, site marked, Risks and benefits discussed,  Surgical consent,  Pre-op evaluation,  At surgeon's request and post-op pain management  Laterality: Right  Prep: chloraprep       Needles:  Injection technique: Single-shot  Needle Type: Echogenic Needle     Needle Length: 5cm  Needle Gauge: 21     Additional Needles:   Procedures:,,,, ultrasound used (permanent image in chart),,,,  Narrative:  Start time: 11/14/2019 7:08 AM End time: 11/14/2019 7:16 AM Injection made incrementally with aspirations every 5 mL.  Performed by: Personally  Anesthesiologist: Barnet Glasgow, MD

## 2019-11-14 NOTE — H&P (Signed)
ORTHOPAEDIC H&P  PCP:  Shirline Frees, MD  Chief Complaint: Right hand numbness and tingling  HPI: [name] [name] is a 58 y.o. male who complains of right hand numbness and tingling.  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  This was confirmed by an EMG.  He underwent conservative treatment and despite that had continued numbness and tingling throughout the entire hand.  After discussing treatment options the patient did wish to proceed with both right-sided carpal and cubital tunnel releases.  Past Medical History:  Diagnosis Date  . Arthritis   . Barrett's esophagus 05/22/2015  . Chronic back pain   . COPD (chronic obstructive pulmonary disease) ([place])   . Coronary artery disease    a. cath in 2010 showing 20% distal LM, 70-80% Ost LAD, 30% OM, and 40% RCA b. low-risk NST in 2014 and 2016 c. 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 Underwent repair of dissection and SVG-RCA.   Marland Kitchen Dyspnea   . GERD (gastroesophageal reflux disease)   . Gout   . Hemorrhoids   . History of alcohol abuse    Remote  . History of bronchitis 2015  . History of kidney stones   . Hyperlipidemia   . Hypertension   . Lymphocytic colitis 2009  . Myocardial infarction ([place]) 2010   Admit for CP 11/2009; ruled out for MI but had 70-80% ostail LAD stenosis and treated medically following NL perfusion study)  . NASH (nonalcoholic steatohepatitis)   . Peripheral vascular disease ([place])   . Personal history of colonic polyps 07/20/2002   Diminutive adenomas (3) 06/2002 diminutive adenoma (1) 2011   . Pre-diabetes   . Stroke ([place])   . TIA (transient ischemic attack)   . Urinary urgency    Past Surgical History:  Procedure Laterality Date  . [place] VITRECTOMY WITH 20 GAUGE MVR PORT N/A 03/25/2019   Procedure: WOUND VAC CHANGE AND  IRRIGATION;  Surgeon: Ivin Poot, MD;  Location: [place];  Service: Thoracic;  Laterality: N/A;  . ANEURYSM COILING  12/2010   cerebral  . APPLICATION OF WOUND VAC N/A 03/22/2019   Procedure: APPLICATION OF WOUND VAC;  Surgeon: Ivin Poot, MD;  Location: [place];  Service: Thoracic;  Laterality: N/A;  . APPLICATION OF WOUND VAC N/A 03/29/2019   Procedure: WOUND VAC CHANGE;  Surgeon: Ivin Poot, MD;  Location: [place];  Service: Thoracic;  Laterality: N/A;  . APPLICATION OF WOUND VAC N/A 03/31/2019   Procedure: WOUND VAC CHANGE;  Surgeon: Ivin Poot, MD;  Location: [place];  Service: Thoracic;  Laterality: N/A;  . BACK SURGERY    . BAND HEMORRHOIDECTOMY  2003   at sigmoidoscopy  . CARDIAC CATHETERIZATION  2010  . CHOLECYSTECTOMY  08/03/2015  . CHOLECYSTECTOMY N/A 08/03/2015   Procedure: LAPAROSCOPIC CHOLECYSTECTOMY WITH INTRAOPERATIVE CHOLANGIOGRAM;  Surgeon: Fanny Skates, MD;  Location: [place];  Service: General;  Laterality: N/A;  . COLONOSCOPY  multiple  . CORONARY ARTERY BYPASS GRAFT N/A 02/23/2019   Procedure: CORONARY ARTERY BYPASS GRAFTING (CABG) x 1; Using Endoscopically Harvested Right Leg Greater Saphenous Vein Graft (SVG); SVG to RCA;  Surgeon: Ivin Poot, MD;  Location: [place];  Service: Open Heart Surgery;  Laterality: N/A;  . CORONARY STENT INTERVENTION N/A 02/23/2019   Procedure: CORONARY STENT INTERVENTION;  Surgeon: Martinique,  Ander Slade, MD;  Location: [place] [facility];  Service: Cardiovascular;  Laterality: N/A;  . ESOPHAGOGASTRODUODENOSCOPY    . KNEE ARTHROSCOPY WITH MEDIAL MENISECTOMY Left 05/20/2017   Procedure: LEFT KNEE ARTHROSCOPY WITH PARTIAL MEDIAL MENISCECTOMY;  Surgeon: Leandrew Koyanagi, MD;  Location: [place];  Service: Orthopedics;  Laterality: Left;  . LEFT HEART CATH AND CORONARY ANGIOGRAPHY N/A 02/23/2019   Procedure: LEFT HEART CATH AND CORONARY ANGIOGRAPHY;  Surgeon: Martinique, Peter M, MD;  Location: [place] [facility];  Service:  Cardiovascular;  Laterality: N/A;  . LUMBAR SPINE SURGERY     x 2  . NASAL SINUS SURGERY    . PECTORALIS FLAP N/A 04/04/2019   Procedure: right pectoralis muscle flap for sternal wound reconstruction and VAC placement;  Surgeon: Wallace Going, DO;  Location: [place];  Service: Plastics;  Laterality: N/A;  2.5 hours case length  . REPAIR OF ACUTE ASCENDING THORACIC AORTIC DISSECTION N/A 02/23/2019   Procedure: REPAIR OF TYPE A  - ACUTE ASCENDING THORACIC AORTIC DISSECTION, using Hemashield Platinum Woven Double Velour Vascular Graft (D: 59m, L: 30cm);  Surgeon: VPrescott Gum PCollier Salina MD;  [place] [facility]  Service: Vascular;  Laterality: N/A;  . STERNAL WOUND DEBRIDEMENT N/A 03/22/2019   Procedure: STERNAL WOUND DEBRIDEMENT;  Surgeon: VIvin Poot MD;  [place] [facility]  Service: Thoracic;  Laterality: N/A;  . STERNAL WOUND DEBRIDEMENT N/A 03/25/2019   Procedure: SUPERFICIAL STERNAL WOUND DEBRIDEMENT;  Surgeon: VIvin Poot MD;  [place] [facility]  Service: Thoracic;  Laterality: N/A;  . STERNAL WOUND DEBRIDEMENT N/A 03/29/2019   Procedure: SUPERFICIAL STERNAL WOUND DEBRIDEMENT;  Surgeon: VIvin Poot MD;  [place] [facility]  Service: Thoracic;  Laterality: N/A;  . STERNAL WOUND DEBRIDEMENT N/A 03/31/2019   Procedure: STERNAL WOUND DEBRIDEMENT USING 1G ACEL POWDER;  Surgeon: VIvin Poot MD;  [place] [facility]  Service: Thoracic;  Laterality: N/A;  . STERNAL WOUND DEBRIDEMENT N/A 04/04/2019   Procedure: STERNAL WOUND DEBRIDEMENT;  Surgeon: VIvin Poot MD;  [place] [facility]  Service: Thoracic;  Laterality: N/A;  . TEE WITHOUT CARDIOVERSION  05/28/2012   Procedure: TRANSESOPHAGEAL ECHOCARDIOGRAM (TEE);  Surgeon: BLelon Perla MD;  [facility]  Service: Cardiovascular;  Laterality: N/A;  . TEE WITHOUT CARDIOVERSION N/A 02/23/2019   Procedure: TRANSESOPHAGEAL ECHOCARDIOGRAM (TEE);  Surgeon: VPrescott Gum PCollier Salina MD;  [place] [facility]  Service: Open Heart Surgery;  Laterality:  N/A;   Social History   Socioeconomic History  . Marital status: Married    Spouse name: Not on file  . Number of children: 3  . Years of education: Not on file  . Highest education level: Not on file  Occupational History  . Occupation: Disabled    EFish farm manager UNEMPLOYED  Social Needs  . Financial resource strain: Not on file  . Food insecurity    Worry: Not on file    Inability: Not on file  . Transportation needs    Medical: Not on file    Non-medical: Not on file  Tobacco Use  . Smoking status: Former Smoker    Packs/day: 1.00    Years: 4.00    Pack years: 4.00    Types: Cigarettes    Quit date: 12/22/2018    Years since quitting: 0.8  . Smokeless tobacco: Never Used  Substance and Sexual Activity  . Alcohol use: No    Alcohol/week: 0.0 standard drinks    Comment: no alcohol in 229yr  . Drug use:  No  . Sexual activity: Not on file  Lifestyle  . Physical activity    Days per week: Not on file    Minutes per session: Not on file  . Stress: Not on file  Relationships  . Social Herbalist on phone: Not on file    Gets together: Not on file    Attends religious service: Not on file    Active member of club or organization: Not on file    Attends meetings of clubs or organizations: Not on file    Relationship status: Not on file  Other Topics Concern  . Not on file  Social History Narrative  . Not on file   Family History  Problem Relation Age of Onset  . Liver disease Mother   . Liver cancer Mother   . Breast cancer Mother   . Cancer Mother   . Hypertension Mother   . Heart attack Mother   . Cancer Father   . Heart disease Father        before age 11  . Hyperlipidemia Father   . Hypertension Father   . Heart attack Father   . Heart disease Other        multiple family members on maternal and paternal side of family  . Diabetes Sister   . Hyperlipidemia Sister   . Hypertension Sister   . Diabetes Paternal Grandmother   . Hypertension  Brother   . Colon cancer Neg Hx    Allergies  Allergen Reactions  . Penicillins Hives, Rash and Other (See Comments)    Did it involve swelling of the face/tongue/throat, SOB, or low BP? Yes Did it involve sudden or severe rash/hives, skin peeling, or any reaction on the inside of your mouth or nose? Yes  Did you need to seek medical attention at a hospital or doctor's office? Yes When did it last happen?As a child. If all above answers are "NO", may proceed with cephalosporin use.    Prior to Admission medications   Medication Sig Start Date End Date Taking? Authorizing Provider  albuterol (PROVENTIL HFA;VENTOLIN HFA) 108 (90 Base) MCG/ACT inhaler Inhale 1 puff into the lungs every 6 (six) hours as needed for wheezing or shortness of breath.    Yes [provider]  allopurinol (ZYLOPRIM) 100 MG tablet Take 100 mg by mouth daily.    Yes [provider]  amLODipine (NORVASC) 10 MG tablet Take 10 mg by mouth daily.   Yes [provider]  aspirin EC 81 MG EC tablet Take 1 tablet (81 mg total) by mouth daily. 04/07/19  Yes Roddenberry, Arlis Porta, PA-C  atorvastatin (LIPITOR) 80 MG tablet Take 1 tablet (80 mg total) by mouth daily at 6 PM. 03/05/19  Yes Roddenberry, Myron G, PA-C  esomeprazole (NEXIUM) 40 MG capsule Take 40 mg by mouth 2 (two) times daily.   Yes [provider]  Fluticasone-Umeclidin-Vilant (TRELEGY ELLIPTA) 200-62.5-25 MCG/INH AEPB Inhale into the lungs 2 (two) times daily.   Yes [provider]  HYDROcodone-acetaminophen (NORCO/VICODIN) 5-325 MG tablet Take 1 tablet by mouth 2 (two) times daily as needed. 06/08/19  Yes [provider]  losartan (COZAAR) 100 MG tablet Take 1 tablet (100 mg total) by mouth daily. 03/06/19  Yes Roddenberry, Arlis Porta, PA-C  metoprolol tartrate (LOPRESSOR) 50 MG tablet Take 1 tablet (50 mg total) by mouth 2 (two) times daily. 09/19/19  Yes Satira Sark, MD  milk thistle 175 MG tablet Take 175 mg  by mouth daily.   Yes [provider]  Multiple Vitamin (MULITIVITAMIN WITH MINERALS) TABS Take 1 tablet by mouth daily.   Yes [provider]  naproxen (NAPROSYN) 500 MG tablet Take 500 mg by mouth 2 (two) times daily. 06/29/19  Yes [provider]  Omega-3 Fatty Acids (FISH OIL) 1200 MG CPDR Take 1,200 mg by mouth daily.   Yes [provider]  spironolactone (ALDACTONE) 25 MG tablet Take 0.5 tablets (12.5 mg total) by mouth daily. 04/07/19  Yes Roddenberry, Arlis Porta, PA-C  traZODone (DESYREL) 100 MG tablet Take 100 mg by mouth at bedtime.    Yes [provider]  ipratropium-albuterol (DUONEB) 0.5-2.5 (3) MG/3ML SOLN Take 3 mLs by nebulization every 8 (eight) hours as needed (wheezing, shortness of breath). 11/29/18   Johnson, Clanford L, MD  nitroGLYCERIN (NITROSTAT) 0.4 MG SL tablet Place 0.4 mg under the tongue every 5 (five) minutes as needed. For chest pain    [provider]   No results found.  Positive ROS: All other systems have been reviewed and were otherwise negative with the exception of those mentioned in the HPI and as above.  Physical Exam: General: Alert, no acute distress Cardiovascular: No pedal edema Respiratory: No cyanosis, no use of accessory musculature Skin: No lesions in the area of chief complaint Psychiatric: Patient is competent for consent with normal mood and affect Lymphatic: No axillary or cervical lymphadenopathy  MUSCULOSKELETAL: Examination of the right upper extremity shows a grossly normal-appearing right hand. There is no significant swelling, erythema, lymphadenopathy or signs of infection. Does have atrophy of the first web space as well as hypothenar musculature. There are fasciculation seen within the thenar musculature. There are no visible wounds in the forearm either new or old. He has full range of motion of the wrists and digits. He has decreased sensation to the ulnar nerve distribution. He also has  intact but decreased sensation to the median nerve distribution. He has negative Tinel's at the wrist and elbow. He has a positive Phalen's with increasing numbness to the median nerve distribution. He has negative ulnar nerve compression test at the wrist and elbow as he states are already numb and unchanging. His fingertips are warm and perfused with brisk capillary refill. He has 4/5 strength to both the APB and first dorsal interosseous muscles.  Assessment: Right carpal and cubital tunnel syndrome  Plan: Plan to proceed OR today for right carpal and cubital tunnel release.  Risks of surgery were discussed with him which include but not limited to infection, bleeding, damage to surrounding structures including blood vessels and nerves, pain, stiffness, recurrence and need for additional procedures.  Informed consent was obtained.  The patient's right arm was marked.  Plan for discharge home postoperatively with follow-up with me in approximately 10 to 14 days.    Verner Mould, MD [phone]   11/14/2019 7:12 AM

## 2019-11-14 NOTE — Transfer of Care (Signed)
Immediate Anesthesia Transfer of Care Note  Patient: [name] [name]  Procedure(s) Performed: Right carpal tunnel release, Right cubital tunnel release (Right Arm Lower)  Patient Location: PACU  Anesthesia Type:MAC and Regional  Level of Consciousness: awake, alert  and oriented  Airway & Oxygen Therapy: Patient Spontanous Breathing and Patient connected to nasal cannula oxygen  Post-op Assessment: Report given to RN and Post -op Vital signs reviewed and stable  Post vital signs: Reviewed and stable  Last Vitals:  Vitals Value Taken Time  BP 115/71 11/14/19 0830  Temp    Pulse 67 11/14/19 0832  Resp 16 11/14/19 0832  SpO2 96 % 11/14/19 0832  Vitals shown include unvalidated device data.  Last Pain:  Vitals:   11/14/19 0648  PainSc: 7       Patients Stated Pain Goal: 4 (16/10/96 0454)  Complications: No apparent anesthesia complications

## 2019-11-14 NOTE — Op Note (Signed)
PREOPERATIVE DIAGNOSIS: Right carpal and cubital tunnel syndrome  POSTOPERATIVE DIAGNOSIS: Same  ATTENDING PHYSICIAN: Maudry Mayhew. Jeannie Fend, III, MD who was present and scrubbed for the entire case   ASSISTANT SURGEON: None.   ANESTHESIA: MAC with regional  SURGICAL PROCEDURES: 1.  Right carpal tunnel release 2.  Right cubital tunnel release, in situ  SURGICAL INDICATIONS: Patient is a 58 year old male who had a long history of right hand numbness and tingling.  He was seen and evaluated by me in clinic and was diagnosed with right carpal and cubital tunnel syndrome.  He underwent extensive nonoperative treatment including brace immobilization, NSAIDs, activity modification and despite this had continued numbness and tingling.  A subsequent EMG was obtained which confirmed both carpal and cubital tunnel syndrome.  After discussing treatment options the patient did wish to proceed with operative intervention and presents today for that.  FINDING: There is compression of the median nerve in the carpal tunnel.  Successful carpal tunnel release was accomplished with intact tendons and median nerve at its completion.  There is also compression of the ulnar nerve along the medial elbow.  Following release, the ulnar nerve was stable posterior to medial epicondyle and a in situ release was accomplished.   DESCRIPTION OF PROCEDURE: The patient was identified in the preoperative holding area where the risk benefits and alternatives of the procedure were discussed with the patient.  These include but are not limited to infection, bleeding, damage to surrounding structures including blood vessels and nerves, pain, stiffness, recurrence and need for additional procedures.  Informed consent was obtained at that time and the patient's right arm was marked.  Anesthesia then performed a right upper extremity plexus block.  He was brought to the op suite where timeout was performed identifying the correct patient  operative site.  He was positioned supine on the operative table with his hand outstretched on a hand table.  A tourniquet was placed on the upper arm and the patient was induced under MAC sedation.  The right upper extremity was then prepped and draped in usual sterile fashion.  The limb was exsanguinated and the tourniquet was inflated.  We began with the carpal tunnel release.  A longitudinal incision was made in the palm in line with the fourth digit.  This was centered about the distal aspect of the transverse carpal ligament.  Sharp dissection was carried down through the subcutaneous tissue.  The pulmonary porosis was identified and incised in line with the surgical incision.  The transverse fibers of the transverse carpal ligament were then seen and carefully incised using a 15 blade.  This is done working distal to proximal.  Care was taken to release the full distal portion of the ligament with visualization of the palmar fat.  Once the ligament was released to the proximal portion of the skin incision a hemostat was passed palmar to the ligament bluntly dissecting in this area.  The Integra carpal tunnel release ski was then inserted deep to the ligament taking care to protect the underlying nerve and tendinous structures.  The carpal tunnel release blade was then advanced carefully within the ski releasing the remaining proximal portion of the ligament.  A Valora Corporal was inserted into the wound and was found to have complete release of the transverse carpal ligament.  The median nerve was visualized and found to be intact as were all deep tendons.  The radial and ulnar leaflets were carefully elevated using the Nada as well.  The wound was then  copiously irrigated with normal saline and the skin was closed with interrupted 4-0 Prolene's.  Attention was then turned to the medial elbow.  An L-shaped incision was made centered on the medial epicondyle.  Blunt dissection was carried down through the  subcutaneous tissues.  Care was taken to protect crossing superficial skin nerves.  The medial condyle was then visualized and the ulnar nerve was found posterior to the nerve.  Working retrograde tracking the nerve proximally the nerve was released from its overlying fascia as it extended up into the triceps intermuscular septum as well as the arcade of Struthers.  This was released throughout this entire course.  We then worked following the nerve distally as it traced posterior to the medial epicondyle.  There is a thickened Osborne's ligament which was incised releasing pressure on the nerve.  Continue to work distally there was a very tight fascial band over top of the nerve as it entered into the 2 heads of the FCU muscle belly.  This fascia was incised both superficial and deep layers.  The nerve was fully released until the first motor branch of the FCU.  The nerve was directly palpated and no further areas of compression were found on the nerve.  The elbow was ranged and the patient was found to have stable nerve posterior to the medial epicondyle.  The decision was made to perform an in situ release at this point.  The wound was copiously irrigated with normal saline and the skin was closed with deep 3-0 Vicryl sutures followed by interrupted 4-0 Prolene sutures.  Xeroform, 4 x 4's and a well-padded long-arm splint were applied to both wounds.  The tourniquet was released and the patient had return of brisk capillary refill to all of his digits.  He was awoken from his sedation and taken the PACU in stable condition.  He tolerated the procedure well and there were no complications.  ESTIMATED BLOOD LOSS:  <10 mLs  TOURNIQUET TIME: 35 minutes  SPECIMENS: None  POSTOPERATIVE PLAN: The patient will be discharged home and seen back  in the office in approximately 10-12 days for wound check, suture  removal, and then be sent to a therapist for elbow, wrist and hand range of motion exercises, edema  control and scar management  IMPLANTS: none

## 2019-11-14 NOTE — Progress Notes (Signed)
Assisted Dr. Valma Cava with right, ultrasound guided, supraclavicular block. Side rails up, monitors on throughout procedure. See vital signs in flow sheet. Tolerated Procedure well.

## 2019-11-14 NOTE — Discharge Instructions (Signed)
Discharge Instructions  - Keep dressings in place. Do not remove them. - The dressings must stay dry - Take all medication as prescribed. Transition to over the counter pain medication as your pain improves - Keep the hand elevated over the next 48-72 hours to help with pain and swelling - Move all digits not restricted by the dressings regularly to prevent stiffness - Please call to schedule a follow up appointment with Dr. Jeannie Fend and therapy at [phone] for 10-14 days following surgery - Your pain medication have been send digitally to your pharmacy    Post Anesthesia Home Care Instructions  Activity: Get plenty of rest for the remainder of the day. A responsible individual must stay with you for 24 hours following the procedure.  For the next 24 hours, DO NOT: -Drive a car -Paediatric nurse -Drink alcoholic beverages -Take any medication unless instructed by your physician -Make any legal decisions or sign important papers.  Meals: Start with liquid foods such as gelatin or soup. Progress to regular foods as tolerated. Avoid greasy, spicy, heavy foods. If nausea and/or vomiting occur, drink only clear liquids until the nausea and/or vomiting subsides. Call your physician if vomiting continues.  Special Instructions/Symptoms: Your throat may feel dry or sore from the anesthesia or the breathing tube placed in your throat during surgery. If this causes discomfort, gargle with warm salt water. The discomfort should disappear within 24 hours.  If you had a scopolamine patch placed behind your ear for the management of post- operative nausea and/or vomiting:  1. The medication in the patch is effective for 72 hours, after which it should be removed.  Wrap patch in a tissue and discard in the trash. Wash hands thoroughly with soap and water. 2. You may remove the patch earlier than 72 hours if you experience unpleasant side effects which may include dry mouth, dizziness or  visual disturbances. 3. Avoid touching the patch. Wash your hands with soap and water after contact with the patch.   Regional Anesthesia Blocks  1. Numbness or the inability to move the "blocked" extremity may last from 3-48 hours after placement. The length of time depends on the medication injected and your individual response to the medication. If the numbness is not going away after 48 hours, call your surgeon.  2. The extremity that is blocked will need to be protected until the numbness is gone and the  Strength has returned. Because you cannot feel it, you will need to take extra care to avoid injury. Because it may be weak, you may have difficulty moving it or using it. You may not know what position it is in without looking at it while the block is in effect.  3. For blocks in the legs and feet, returning to weight bearing and walking needs to be done carefully. You will need to wait until the numbness is entirely gone and the strength has returned. You should be able to move your leg and foot normally before you try and bear weight or walk. You will need someone to be with you when you first try to ensure you do not fall and possibly risk injury.  4. Bruising and tenderness at the needle site are common side effects and will resolve in a few days.  5. Persistent numbness or new problems with movement should be communicated to the surgeon or the [facility] [phone] [place] [phone]).

## 2019-11-15 ENCOUNTER — Encounter (HOSPITAL_BASED_OUTPATIENT_CLINIC_OR_DEPARTMENT_OTHER): Payer: Self-pay | Admitting: Orthopaedic Surgery

## 2019-11-15 NOTE — Addendum Note (Signed)
Addendum  created 11/15/19 1046 by Jodi Criscuolo, Ernesta Amble, CRNA   Charge Capture section accepted

## 2020-03-16 ENCOUNTER — Other Ambulatory Visit: Payer: Self-pay | Admitting: Cardiology

## 2020-03-24 ENCOUNTER — Other Ambulatory Visit: Payer: Self-pay | Admitting: Physician Assistant

## 2020-04-05 ENCOUNTER — Ambulatory Visit (INDEPENDENT_AMBULATORY_CARE_PROVIDER_SITE_OTHER): Payer: Medicare Other | Admitting: Cardiology

## 2020-04-05 ENCOUNTER — Other Ambulatory Visit: Payer: Self-pay

## 2020-04-05 ENCOUNTER — Encounter: Payer: Self-pay | Admitting: *Deleted

## 2020-04-05 ENCOUNTER — Encounter: Payer: Self-pay | Admitting: Cardiology

## 2020-04-05 VITALS — BP 144/86 | HR 80 | Ht 69.5 in | Wt 252.8 lb

## 2020-04-05 DIAGNOSIS — I25119 Atherosclerotic heart disease of native coronary artery with unspecified angina pectoris: Secondary | ICD-10-CM

## 2020-04-05 DIAGNOSIS — E782 Mixed hyperlipidemia: Secondary | ICD-10-CM

## 2020-04-05 MED ORDER — ISOSORBIDE MONONITRATE ER 30 MG PO TB24: 30.0000 mg | ORAL_TABLET | Freq: Every day | ORAL | 3 refills | Status: DC

## 2020-04-05 NOTE — Patient Instructions (Signed)
Medication Instructions:  Your physician has recommended you make the following change in your medication:  Start Imdur 15 mg Daily  for 2 weeks then increase to 30 mg Daily   *If you need a refill on your cardiac medications before your next appointment, please call your pharmacy*   Lab Work: NONE  If you have labs (blood work) drawn today and your tests are completely normal, you will receive your results only by: Marland Kitchen MyChart Message (if you have MyChart) OR . A paper copy in the mail If you have any lab test that is abnormal or we need to change your treatment, we will call you to review the results.   Testing/Procedures: NONE    Follow-Up: At [facility], you and your health needs are our priority.  As part of our continuing mission to provide you with exceptional heart care, we have created designated Provider Care Teams.  These Care Teams include your primary Cardiologist (physician) and Advanced Practice Providers (APPs -  Physician Assistants and Nurse Practitioners) who all work together to provide you with the care you need, when you need it.  We recommend signing up for the patient portal called "MyChart".  Sign up information is provided on this After Visit Summary.  MyChart is used to connect with patients for Virtual Visits (Telemedicine).  Patients are able to view lab/test results, encounter notes, upcoming appointments, etc.  Non-urgent messages can be sent to your provider as well.   To learn more about what you can do with MyChart, go to [email or web address].    Your next appointment:   6 month(s)  The format for your next appointment:   In Person  Provider:   Rozann Lesches, MD   Other Instructions Thank you for choosing [facility]!

## 2020-04-05 NOTE — Progress Notes (Signed)
Cardiology Office Note  Date: 04/05/2020   ID: [name] [name], DOB July 08, 1961, MRN [id number]  PCP:  Shirline Frees, MD  Cardiologist:  Rozann Lesches, MD Electrophysiologist:  None   Chief Complaint  Patient presents with  . Cardiac follow-up    History of Present Illness: [name] [name] is a 59 y.o. male last assessed via telehealth encounter in October 2020.  He presents for a routine visit today.  He states that he has not used any nitroglycerin, but does have recurring episodes of chest discomfort with both typical and atypical features.  Some symptoms are positional or related to coughing, other sound more like angina.  He has been trying to increase his activity outdoors.  I reviewed his medications which are outlined below and stable from a cardiac perspective.  He does not report any obvious intolerances.  He states that he had follow-up lab work with his PCP recently, we are requesting this for review.  I personally reviewed his ECG today which shows normal sinus rhythm with right bundle branch block, old.  Past Medical History:  Diagnosis Date  . Arthritis   . Barrett's esophagus 05/22/2015  . Chronic back pain   . COPD (chronic obstructive pulmonary disease) ([place])   . Coronary artery disease    a. cath in 2010 showing 20% distal LM, 70-80% Ost LAD, 30% OM, and 40% RCA b. low-risk NST in 2014 and 2016 c. 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 Underwent repair of dissection and SVG-RCA.   . Essential hypertension   . GERD (gastroesophageal reflux disease)   . Gout   . Hemorrhoids   . History of alcohol abuse    Remote  . History of bronchitis 2015  . History of kidney stones   . Hyperlipidemia   . Lymphocytic colitis 2009  . NASH (nonalcoholic steatohepatitis)   . Peripheral vascular disease ([place])   . Personal history of colonic polyps 07/20/2002   Diminutive adenomas (3) 06/2002 diminutive adenoma (1) 2011   . Pre-diabetes   . Stroke ([place])   . TIA (transient ischemic attack)   . Urinary urgency     Past Surgical History:  Procedure Laterality Date  . [place] VITRECTOMY WITH 20 GAUGE MVR PORT N/A 03/25/2019   Procedure: WOUND VAC CHANGE AND IRRIGATION;  Surgeon: Ivin Poot, MD;  Location: [place];  Service: Thoracic;  Laterality: N/A;  . ANEURYSM COILING  12/2010   cerebral  . APPLICATION OF WOUND VAC N/A 03/22/2019   Procedure: APPLICATION OF WOUND VAC;  Surgeon: Ivin Poot, MD;  Location: [place];  Service: Thoracic;  Laterality: N/A;  . APPLICATION OF WOUND VAC N/A 03/29/2019   Procedure: WOUND VAC CHANGE;  Surgeon: Ivin Poot, MD;  Location: [place];  Service: Thoracic;  Laterality: N/A;  . APPLICATION OF WOUND VAC N/A 03/31/2019   Procedure: WOUND VAC CHANGE;  Surgeon: Ivin Poot, MD;  Location: [place];  Service: Thoracic;  Laterality: N/A;  . BACK SURGERY    . BAND HEMORRHOIDECTOMY  2003   at sigmoidoscopy  . CARDIAC CATHETERIZATION  2010  . CARPAL TUNNEL RELEASE Right 11/14/2019   Procedure: Right carpal tunnel release, Right cubital tunnel release;  Surgeon: Verner Mould, MD;  Location: [place];  Service: Orthopedics;  Laterality: Right;  block in preop  . CHOLECYSTECTOMY  08/03/2015  . CHOLECYSTECTOMY  N/A 08/03/2015   Procedure: LAPAROSCOPIC CHOLECYSTECTOMY WITH INTRAOPERATIVE CHOLANGIOGRAM;  Surgeon: Fanny Skates, MD;  Location: [place];  Service: General;  Laterality: N/A;  . COLONOSCOPY  multiple  . CORONARY ARTERY BYPASS GRAFT N/A 02/23/2019   Procedure: CORONARY ARTERY BYPASS GRAFTING (CABG) x 1; Using Endoscopically Harvested Right Leg Greater Saphenous Vein Graft (SVG); SVG to RCA;  Surgeon: Ivin Poot, MD;  Location: [place];  Service: Open Heart Surgery;  Laterality: N/A;  . CORONARY STENT INTERVENTION N/A 02/23/2019   Procedure: CORONARY STENT INTERVENTION;   Surgeon: Martinique, Peter M, MD;  Location: [place] [facility];  Service: Cardiovascular;  Laterality: N/A;  . ESOPHAGOGASTRODUODENOSCOPY    . KNEE ARTHROSCOPY WITH MEDIAL MENISECTOMY Left 05/20/2017   Procedure: LEFT KNEE ARTHROSCOPY WITH PARTIAL MEDIAL MENISCECTOMY;  Surgeon: Leandrew Koyanagi, MD;  Location: [place];  Service: Orthopedics;  Laterality: Left;  . LEFT HEART CATH AND CORONARY ANGIOGRAPHY N/A 02/23/2019   Procedure: LEFT HEART CATH AND CORONARY ANGIOGRAPHY;  Surgeon: Martinique, Peter M, MD;  Location: [place] [facility];  Service: Cardiovascular;  Laterality: N/A;  . LUMBAR SPINE SURGERY     x 2  . NASAL SINUS SURGERY    . PECTORALIS FLAP N/A 04/04/2019   Procedure: right pectoralis muscle flap for sternal wound reconstruction and VAC placement;  Surgeon: Wallace Going, DO;  Location: [place];  Service: Plastics;  Laterality: N/A;  2.5 hours case length  . REPAIR OF ACUTE ASCENDING THORACIC AORTIC DISSECTION N/A 02/23/2019   Procedure: REPAIR OF TYPE A  - ACUTE ASCENDING THORACIC AORTIC DISSECTION, using Hemashield Platinum Woven Double Velour Vascular Graft (D: 20m, L: 30cm);  Surgeon: VPrescott Gum PCollier Salina MD;  [place]  Service: Vascular;  Laterality: N/A;  . STERNAL WOUND DEBRIDEMENT N/A 03/22/2019   Procedure: STERNAL WOUND DEBRIDEMENT;  Surgeon: VIvin Poot MD;  [place]  Service: Thoracic;  Laterality: N/A;  . STERNAL WOUND DEBRIDEMENT N/A 03/25/2019   Procedure: SUPERFICIAL STERNAL WOUND DEBRIDEMENT;  Surgeon: VIvin Poot MD;  [place]  Service: Thoracic;  Laterality: N/A;  . STERNAL WOUND DEBRIDEMENT N/A 03/29/2019   Procedure: SUPERFICIAL STERNAL WOUND DEBRIDEMENT;  Surgeon: VIvin Poot MD;  [place]  Service: Thoracic;  Laterality: N/A;  . STERNAL WOUND DEBRIDEMENT N/A 03/31/2019   Procedure: STERNAL WOUND DEBRIDEMENT USING 1G ACEL POWDER;  Surgeon: VIvin Poot MD;  [place]  Service: Thoracic;  Laterality:  N/A;  . STERNAL WOUND DEBRIDEMENT N/A 04/04/2019   Procedure: STERNAL WOUND DEBRIDEMENT;  Surgeon: VIvin Poot MD;  [place]  Service: Thoracic;  Laterality: N/A;  . TEE WITHOUT CARDIOVERSION  05/28/2012   Procedure: TRANSESOPHAGEAL ECHOCARDIOGRAM (TEE);  Surgeon: BLelon Perla MD;  [facility]  Service: Cardiovascular;  Laterality: N/A;  . TEE WITHOUT CARDIOVERSION N/A 02/23/2019   Procedure: TRANSESOPHAGEAL ECHOCARDIOGRAM (TEE);  Surgeon: VPrescott Gum PCollier Salina MD;  [place]  Service: Open Heart Surgery;  Laterality: N/A;    Current Outpatient Medications  Medication Sig Dispense Refill  . albuterol (PROVENTIL HFA;VENTOLIN HFA) 108 (90 Base) MCG/ACT inhaler Inhale 1 puff into the lungs every 6 (six) hours as needed for wheezing or shortness of breath.     . allopurinol (ZYLOPRIM) 100 MG tablet Take 100 mg by mouth daily.     .Marland KitchenamLODipine (NORVASC) 10 MG tablet Take 10 mg by mouth daily.    .Marland Kitchenaspirin EC 81 MG EC tablet Take 1 tablet (81  mg total) by mouth daily.    Marland Kitchen atorvastatin (LIPITOR) 80 MG tablet Take 1 tablet (80 mg total) by mouth daily at 6 PM. 30 tablet 2  . esomeprazole (NEXIUM) 40 MG capsule Take 40 mg by mouth 2 (two) times daily.    . Fluticasone-Umeclidin-Vilant (TRELEGY ELLIPTA) 200-62.5-25 MCG/INH AEPB Inhale into the lungs 2 (two) times daily.    Marland Kitchen HYDROcodone-acetaminophen (NORCO/VICODIN) 5-325 MG tablet Take 1-2 tablets by mouth every 6 (six) hours as needed. 30 tablet 0  . ipratropium-albuterol (DUONEB) 0.5-2.5 (3) MG/3ML SOLN Take 3 mLs by nebulization every 8 (eight) hours as needed (wheezing, shortness of breath). 360 mL 0  . losartan (COZAAR) 100 MG tablet Take 1 tablet (100 mg total) by mouth daily. 30 tablet 2  . metoprolol tartrate (LOPRESSOR) 50 MG tablet Take 1 tablet by mouth twice daily 180 tablet 1  . milk thistle 175 MG tablet Take 175 mg by mouth daily.    . Multiple Vitamin (MULITIVITAMIN WITH MINERALS) TABS Take 1 tablet by  mouth daily.    . naproxen (NAPROSYN) 500 MG tablet Take 500 mg by mouth 2 (two) times daily.    . nitroGLYCERIN (NITROSTAT) 0.4 MG SL tablet Place 0.4 mg under the tongue every 5 (five) minutes as needed. For chest pain    . Omega-3 Fatty Acids (FISH OIL) 1200 MG CPDR Take 1,200 mg by mouth daily.    Marland Kitchen spironolactone (ALDACTONE) 25 MG tablet Take 0.5 tablets (12.5 mg total) by mouth daily. 30 tablet 2  . traZODone (DESYREL) 100 MG tablet Take 100 mg by mouth at bedtime.     . isosorbide mononitrate (IMDUR) 30 MG 24 hr tablet Take 1 tablet (30 mg total) by mouth daily. 90 tablet 3   No current facility-administered medications for this visit.   Allergies:  Penicillins   ROS:   No palpitations or syncope.  Reports problems with COPD, uses MDIs regularly.  Physical Exam: VS:  BP (!) 144/86   Pulse 80   Ht 5' 9.5" (1.765 m)   Wt 252 lb 12.8 oz (114.7 kg)   SpO2 96%   BMI 36.80 kg/m , BMI Body mass index is 36.8 kg/m.  Wt Readings from Last 3 Encounters:  04/05/20 252 lb 12.8 oz (114.7 kg)  11/14/19 247 lb 12.8 oz (112.4 kg)  10/28/19 231 lb (104.8 kg)    General: Patient appears comfortable at rest. HEENT: Conjunctiva and lids normal, wearing a mask. Neck: Supple, no elevated JVP or carotid bruits, no thyromegaly. Lungs: Clear to auscultation, nonlabored breathing at rest. Cardiac: Regular rate and rhythm, no S3, 2/6 systolic murmur, no pericardial rub. Abdomen: Soft, nontender, bowel sounds present. Extremities: No pitting edema, distal pulses 2+.  ECG:  An ECG dated 03/18/2019 was personally reviewed today and demonstrated:  Sinus tachycardia with right bundle branch block and frequent PACs.  Recent Labwork: 04/07/2019: Hemoglobin 8.6; Platelets 381 11/10/2019: BUN 11; Creatinine, Ser 0.70; Potassium 3.9; Sodium 138     Component Value Date/Time   CHOL 156 02/23/2019 1314   TRIG 101 02/23/2019 1314   HDL 31 (L) 02/23/2019 1314   CHOLHDL 5.0 02/23/2019 1314   VLDL 20  02/23/2019 1314   LDLCALC 105 (H) 02/23/2019 1314    Other Studies Reviewed Today:  Echocardiogram 03/28/2019: 1. The left ventricle has low normal systolic function, with an ejection fraction of 50-55%. The cavity size was normal. 2. Left atrial size was moderately dilated. 3. Small pericardial effusion. 4. Small mostly apical pericardial  effusion no signs of tamponade. 5. Mild thickening of the mitral valve leaflet. Mild calcification of the mitral valve leaflet. 6. Tricuspid valve regurgitation is mild-moderate. 7. The aortic valve is tricuspid Mild thickening of the aortic valve Mild calcification of the aortic valve. Aortic valve regurgitation is trivial by color flow Doppler. 8. The aortic root is normal in size and structure. 9. The interatrial septum was not assessed.  Cardiac catheterization and PCI 02/23/2019:  Dist LM to Ost LAD lesion is 45% stenosed.  Prox Cx to Mid Cx lesion is 30% stenosed.  Prox LAD lesion is 30% stenosed.  Prox RCA lesion is 90% stenosed.  Post intervention, there is a 0% residual stenosis.  A drug-eluting stent was successfully placed using a STENT SYNERGY DES 2.5X16.  A drug-eluting stent was successfully placed using a STENT SYNERGY DES 2.75X12.  The left ventricular systolic function is normal.  LV end diastolic pressure is normal.  The left ventricular ejection fraction is 55-65% by visual estimate.  1. Single vessel obstructive CAD involving the proximal RCA 2. Normal LV function 3. Normal LVEDP 4. Successful stenting of the proximal RCA with DES x 2 5. 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   Assessment and Plan:  1.  CAD status post DES x2 to the proximal RCA in February 2020 with subsequent SVG to RCA at time of surgical repair of catheter related ascending aortic dissection.  He reports chest pain with typical and atypical features.  ECG reviewed and stable.  Current regimen  includes aspirin, Lipitor, Cozaar, Lopressor, and Aldactone.  We will add Imdur beginning at 15 mg once in the evening and uptitrate to 30 mg once in the evening as tolerated.  2.  Mixed hyperlipidemia, on high-dose Lipitor.  Requesting recent lab work from PCP.  Goal LDL under 70.  3.  Essential hypertension, continue present therapy including Lopressor, Cozaar, and Aldactone.  Imdur also being added as an antianginal.  Medication Adjustments/Labs and Tests Ordered: Current medicines are reviewed at length with the patient today.  Concerns regarding medicines are outlined above.   Tests Ordered: Orders Placed This Encounter  Procedures  . EKG 12-Lead    Medication Changes: Meds ordered this encounter  Medications  . isosorbide mononitrate (IMDUR) 30 MG 24 hr tablet    Sig: Take 1 tablet (30 mg total) by mouth daily.    Dispense:  90 tablet    Refill:  3    Disposition:  Follow up 6 months in the [place] office.  Signed, Satira Sark, MD, [facility] 04/05/2020 9:46 AM    [facility] [place]. [address] [place], [place] [address] Phone: [phone]; Fax: [phone]

## 2020-09-07 ENCOUNTER — Other Ambulatory Visit: Payer: Self-pay | Admitting: Cardiology

## 2020-10-10 NOTE — Progress Notes (Signed)
Cardiology Office Note  Date: 10/11/2020   ID: [name], DOB Apr 22, 1961, MRN [id number]  PCP:  Shirline Frees, MD  Cardiologist:  Rozann Lesches, MD Electrophysiologist:  None   Chief Complaint  Patient presents with  . Cardiac follow-up    History of Present Illness: [name] [name] is a 59 y.o. male last seen in April.  He presents for a routine visit.  Reports no definite angina symptoms, tolerated the addition of Imdur at the last visit, currently at 30 mg daily.  He states that he has been under a lot of stress due to illnesses and family members.  He started smoking again but thinks that he should be able to stop when things settle down.  I reviewed his interval lab work from February as outlined below.  LDL was 23 at that time on high-dose Lipitor.  Blood pressure was elevated today, he states that he has been compliant with his medications.  I did talk with him about salt restriction and weight loss, also stress reduction.  He does not have a lot of room of uptitrate for up titration and would need an additional antihypertensive if this does not improve.   Past Medical History:  Diagnosis Date  . Arthritis   . Barrett's esophagus 05/22/2015  . Chronic back pain   . COPD (chronic obstructive pulmonary disease) ([place])   . Coronary artery disease    a. cath in 2010 showing 20% distal LM, 70-80% Ost LAD, 30% OM, and 40% RCA b. low-risk NST in 2014 and 2016 c. 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 Underwent repair of dissection and SVG-RCA.   . Essential hypertension   . GERD (gastroesophageal reflux disease)   . Gout   . Hemorrhoids   . History of alcohol abuse    Remote  . History of bronchitis 2015  . History of kidney stones   . Hyperlipidemia   . Lymphocytic colitis 2009  . NASH (nonalcoholic steatohepatitis)   . Peripheral vascular disease  ([place])   . Personal history of colonic polyps 07/20/2002   Diminutive adenomas (3) 06/2002 diminutive adenoma (1) 2011   . Pre-diabetes   . Stroke ([name])   . TIA (transient ischemic attack)   . Urinary urgency     Past Surgical History:  Procedure Laterality Date  . [place] VITRECTOMY WITH 20 GAUGE MVR PORT N/A 03/25/2019   Procedure: WOUND VAC CHANGE AND IRRIGATION;  Surgeon: Ivin Poot, MD;  Location: [facility];  Service: Thoracic;  Laterality: N/A;  . ANEURYSM COILING  12/2010   cerebral  . APPLICATION OF WOUND VAC N/A 03/22/2019   Procedure: APPLICATION OF WOUND VAC;  Surgeon: Ivin Poot, MD;  Location: [facility];  Service: Thoracic;  Laterality: N/A;  . APPLICATION OF WOUND VAC N/A 03/29/2019   Procedure: WOUND VAC CHANGE;  Surgeon: Ivin Poot, MD;  Location: [facility];  Service: Thoracic;  Laterality: N/A;  . APPLICATION OF WOUND VAC N/A 03/31/2019   Procedure: WOUND VAC CHANGE;  Surgeon: Ivin Poot, MD;  Location: [facility];  Service: Thoracic;  Laterality: N/A;  . BACK SURGERY    . BAND HEMORRHOIDECTOMY  2003   at sigmoidoscopy  . CARDIAC CATHETERIZATION  2010  . CARPAL TUNNEL RELEASE Right 11/14/2019   Procedure: Right carpal tunnel release, Right cubital tunnel release;  Surgeon: Verner Mould, MD;  Location: [facility];  Service: Orthopedics;  Laterality: Right;  block in preop  . CHOLECYSTECTOMY  08/03/2015  . CHOLECYSTECTOMY N/A 08/03/2015   Procedure: LAPAROSCOPIC CHOLECYSTECTOMY WITH INTRAOPERATIVE CHOLANGIOGRAM;  Surgeon: Fanny Skates, MD;  Location: [facility];  Service: General;  Laterality: N/A;  . COLONOSCOPY  multiple  . CORONARY ARTERY BYPASS GRAFT N/A 02/23/2019   Procedure: CORONARY ARTERY BYPASS GRAFTING (CABG) x 1; Using Endoscopically Harvested Right Leg Greater Saphenous Vein Graft (SVG); SVG to RCA;  Surgeon: Ivin Poot, MD;  Location: [facility];  Service: Open Heart Surgery;  Laterality: N/A;  . CORONARY STENT INTERVENTION  N/A 02/23/2019   Procedure: CORONARY STENT INTERVENTION;  Surgeon: Martinique, Peter M, MD;  Location: [facility];  Service: Cardiovascular;  Laterality: N/A;  . ESOPHAGOGASTRODUODENOSCOPY    . KNEE ARTHROSCOPY WITH MEDIAL MENISECTOMY Left 05/20/2017   Procedure: LEFT KNEE ARTHROSCOPY WITH PARTIAL MEDIAL MENISCECTOMY;  Surgeon: Leandrew Koyanagi, MD;  Location: [facility];  Service: Orthopedics;  Laterality: Left;  . LEFT HEART CATH AND CORONARY ANGIOGRAPHY N/A 02/23/2019   Procedure: LEFT HEART CATH AND CORONARY ANGIOGRAPHY;  Surgeon: Martinique, Peter M, MD;  Location: [facility];  Service: Cardiovascular;  Laterality: N/A;  . LUMBAR SPINE SURGERY     x 2  . NASAL SINUS SURGERY    . PECTORALIS FLAP N/A 04/04/2019   Procedure: right pectoralis muscle flap for sternal wound reconstruction and VAC placement;  Surgeon: Wallace Going, DO;  Location: [facility];  Service: Plastics;  Laterality: N/A;  2.5 hours case length  . REPAIR OF ACUTE ASCENDING THORACIC AORTIC DISSECTION N/A 02/23/2019   Procedure: REPAIR OF TYPE A  - ACUTE ASCENDING THORACIC AORTIC DISSECTION, using Hemashield Platinum Woven Double Velour Vascular Graft (D: 43m, L: 30cm);  Surgeon: VPrescott Gum PCollier Salina MD;  [place] [facility]  Service: Vascular;  Laterality: N/A;  . STERNAL WOUND DEBRIDEMENT N/A 03/22/2019   Procedure: STERNAL WOUND DEBRIDEMENT;  Surgeon: VIvin Poot MD;  [place] [facility]  Service: Thoracic;  Laterality: N/A;  . STERNAL WOUND DEBRIDEMENT N/A 03/25/2019   Procedure: SUPERFICIAL STERNAL WOUND DEBRIDEMENT;  Surgeon: VIvin Poot MD;  [place] [facility]  Service: Thoracic;  Laterality: N/A;  . STERNAL WOUND DEBRIDEMENT N/A 03/29/2019   Procedure: SUPERFICIAL STERNAL WOUND DEBRIDEMENT;  Surgeon: VIvin Poot MD;  [place] [facility]  Service: Thoracic;  Laterality: N/A;  . STERNAL WOUND DEBRIDEMENT N/A 03/31/2019   Procedure: STERNAL WOUND DEBRIDEMENT USING 1G ACEL POWDER;  Surgeon: VIvin Poot MD;  [place] [facility]  Service: Thoracic;  Laterality: N/A;  . STERNAL WOUND DEBRIDEMENT N/A 04/04/2019   Procedure: STERNAL WOUND DEBRIDEMENT;  Surgeon: VIvin Poot MD;  [place] [facility]  Service: Thoracic;  Laterality: N/A;  . TEE WITHOUT CARDIOVERSION  05/28/2012   Procedure: TRANSESOPHAGEAL ECHOCARDIOGRAM (TEE);  Surgeon: BLelon Perla MD;  [facility];  Service: Cardiovascular;  Laterality: N/A;  . TEE WITHOUT CARDIOVERSION N/A 02/23/2019   Procedure: TRANSESOPHAGEAL ECHOCARDIOGRAM (TEE);  Surgeon: VPrescott Gum PCollier Salina MD;  [place] [facility]  Service: Open Heart Surgery;  Laterality: N/A;    Current Outpatient Medications  Medication Sig Dispense Refill  . albuterol (PROVENTIL HFA;VENTOLIN HFA) 108 (90 Base) MCG/ACT inhaler Inhale 1 puff into the lungs every 6 (six) hours as needed for wheezing or shortness of breath.     . allopurinol (ZYLOPRIM) 100 MG tablet Take 100 mg by mouth daily.     .Marland KitchenamLODipine (NORVASC)  10 MG tablet Take 10 mg by mouth daily.    Marland Kitchen aspirin EC 81 MG EC tablet Take 1 tablet (81 mg total) by mouth daily.    Marland Kitchen atorvastatin (LIPITOR) 80 MG tablet Take 1 tablet (80 mg total) by mouth daily at 6 PM. 30 tablet 2  . esomeprazole (NEXIUM) 40 MG capsule Take 40 mg by mouth 2 (two) times daily.    . Fluticasone-Umeclidin-Vilant (TRELEGY ELLIPTA) 200-62.5-25 MCG/INH AEPB Inhale into the lungs 2 (two) times daily.    Marland Kitchen HYDROcodone-acetaminophen (NORCO/VICODIN) 5-325 MG tablet Take 1-2 tablets by mouth every 6 (six) hours as needed. 30 tablet 0  . ipratropium-albuterol (DUONEB) 0.5-2.5 (3) MG/3ML SOLN Take 3 mLs by nebulization every 8 (eight) hours as needed (wheezing, shortness of breath). 360 mL 0  . isosorbide mononitrate (IMDUR) 30 MG 24 hr tablet Take 1 tablet (30 mg total) by mouth daily. 90 tablet 3  . losartan (COZAAR) 100 MG tablet Take 1 tablet (100 mg total) by mouth daily. 30 tablet 2  . metoprolol tartrate (LOPRESSOR) 50 MG tablet Take 1 tablet  by mouth twice daily 180 tablet 2  . milk thistle 175 MG tablet Take 175 mg by mouth daily.    . Multiple Vitamin (MULITIVITAMIN WITH MINERALS) TABS Take 1 tablet by mouth daily.    . naproxen (NAPROSYN) 500 MG tablet Take 500 mg by mouth 2 (two) times daily.    . nitroGLYCERIN (NITROSTAT) 0.4 MG SL tablet Place 0.4 mg under the tongue every 5 (five) minutes as needed. For chest pain    . Omega-3 Fatty Acids (FISH OIL) 1200 MG CPDR Take 1,200 mg by mouth daily.    Marland Kitchen spironolactone (ALDACTONE) 25 MG tablet Take 0.5 tablets (12.5 mg total) by mouth daily. 30 tablet 2  . traZODone (DESYREL) 100 MG tablet Take 100 mg by mouth at bedtime.      No current facility-administered medications for this visit.   Allergies:  Penicillins   ROS:   No palpitations or syncope.  Physical Exam: VS:  BP (!) 168/90   Pulse 76   Ht 5' 8.5" (1.74 m)   Wt 246 lb (111.6 kg)   SpO2 95%   BMI 36.86 kg/m , BMI Body mass index is 36.86 kg/m.  Wt Readings from Last 3 Encounters:  10/11/20 246 lb (111.6 kg)  04/05/20 252 lb 12.8 oz (114.7 kg)  11/14/19 247 lb 12.8 oz (112.4 kg)    General: Patient appears comfortable at rest. HEENT: Conjunctiva and lids normal, wearing a mask. Neck: Supple, no elevated JVP or carotid bruits, no thyromegaly. Lungs: Clear to auscultation, nonlabored breathing at rest. Cardiac: Regular rate and rhythm, no S3, 2/6 systolic murmur, no pericardial rub. Extremities: No pitting edema, distal pulses 2+.  ECG:  An ECG dated 04/05/2020 was personally reviewed today and demonstrated:  Normal sinus rhythm with right bundle branch block.  Recent Labwork: 11/10/2019: BUN 11; Creatinine, Ser 0.70; Potassium 3.9; Sodium 138     Component Value Date/Time   CHOL 156 02/23/2019 1314   TRIG 101 02/23/2019 1314   HDL 31 (L) 02/23/2019 1314   CHOLHDL 5.0 02/23/2019 1314   VLDL 20 02/23/2019 1314   LDLCALC 105 (H) 02/23/2019 1314  February 2021: Hemoglobin A1c 6.4%, hemoglobin 14.2,  platelets 182, BUN 17, creatinine 1.79, potassium 4.6, AST 29, ALT 35, TSH 0.76, cholesterol 86, triglycerides 224, HDL 28, LDL 23  Other Studies Reviewed Today:  Echocardiogram 03/28/2019: 1. The left ventricle has low normal systolic  function, with an ejection fraction of 50-55%. The cavity size was normal. 2. Left atrial size was moderately dilated. 3. Small pericardial effusion. 4. Small mostly apical pericardial effusion no signs of tamponade. 5. Mild thickening of the mitral valve leaflet. Mild calcification of the mitral valve leaflet. 6. Tricuspid valve regurgitation is mild-moderate. 7. The aortic valve is tricuspid Mild thickening of the aortic valve Mild calcification of the aortic valve. Aortic valve regurgitation is trivial by color flow Doppler. 8. The aortic root is normal in size and structure. 9. The interatrial septum was not assessed.  Cardiac catheterization and PCI 02/23/2019:  Dist LM to Ost LAD lesion is 45% stenosed.  Prox Cx to Mid Cx lesion is 30% stenosed.  Prox LAD lesion is 30% stenosed.  Prox RCA lesion is 90% stenosed.  Post intervention, there is a 0% residual stenosis.  A drug-eluting stent was successfully placed using a STENT SYNERGY DES 2.5X16.  A drug-eluting stent was successfully placed using a STENT SYNERGY DES 2.75X12.  The left ventricular systolic function is normal.  LV end diastolic pressure is normal.  The left ventricular ejection fraction is 55-65% by visual estimate.  1. Single vessel obstructive CAD involving the proximal RCA 2. Normal LV function 3. Normal LVEDP 4. Successful stenting of the proximal RCA with DES x 2 5. 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   Assessment and Plan:  1.  CAD status post DES x2 to the proximal RCA in February 2020 and subsequent SVG to RCA given catheter related aortic dissection.  He reports no progressive angina at this time on medical  therapy.  Continue aspirin, Lipitor, Cozaar, Lopressor, Aldactone, and Imdur.  2.  Mixed hyperlipidemia, on high-dose Lipitor.  Last LDL 23.  3.  Essential hypertension, blood pressure is elevated today.  I talked with him about weight loss and salt restriction.  He reports compliance with his medications.  There is not much room for further up titration, he may ultimately need additional antihypertensive.  4.  CKD stage IIIb, creatinine 1.79.  Medication Adjustments/Labs and Tests Ordered: Current medicines are reviewed at length with the patient today.  Concerns regarding medicines are outlined above.   Tests Ordered: No orders of the defined types were placed in this encounter.   Medication Changes: No orders of the defined types were placed in this encounter.   Disposition:  Follow up 6 months with [place] in the [place] office.  Signed, Satira Sark, MD, [facility] 10/11/2020 1:04 PM    [place] [facility] [address] [place], [place] [address] Phone: [phone]; Fax: [phone]

## 2020-10-11 ENCOUNTER — Encounter: Payer: Self-pay | Admitting: Cardiology

## 2020-10-11 ENCOUNTER — Other Ambulatory Visit: Payer: Self-pay

## 2020-10-11 ENCOUNTER — Ambulatory Visit (INDEPENDENT_AMBULATORY_CARE_PROVIDER_SITE_OTHER): Payer: Medicare Other | Admitting: Cardiology

## 2020-10-11 VITALS — BP 168/90 | HR 76 | Ht 68.5 in | Wt 246.0 lb

## 2020-10-11 DIAGNOSIS — I10 Essential (primary) hypertension: Secondary | ICD-10-CM

## 2020-10-11 DIAGNOSIS — I25119 Atherosclerotic heart disease of native coronary artery with unspecified angina pectoris: Secondary | ICD-10-CM | POA: Diagnosis not present

## 2020-10-11 DIAGNOSIS — E782 Mixed hyperlipidemia: Secondary | ICD-10-CM | POA: Diagnosis not present

## 2020-10-11 NOTE — Patient Instructions (Signed)
Medication Instructions:  Your physician recommends that you continue on your current medications as directed. Please refer to the Current Medication list given to you today.  *If you need a refill on your cardiac medications before your next appointment, please call your pharmacy*   Lab Work: None today If you have labs (blood work) drawn today and your tests are completely normal, you will receive your results only by: Marland Kitchen MyChart Message (if you have MyChart) OR . A paper copy in the mail If you have any lab test that is abnormal or we need to change your treatment, we will call you to review the results.   Testing/Procedures: None today   Follow-Up: At [facility], you and your health needs are our priority.  As part of our continuing mission to provide you with exceptional heart care, we have created designated Provider Care Teams.  These Care Teams include your primary Cardiologist (physician) and Advanced Practice Providers (APPs -  Physician Assistants and Nurse Practitioners) who all work together to provide you with the care you need, when you need it.  We recommend signing up for the patient portal called "MyChart".  Sign up information is provided on this After Visit Summary.  MyChart is used to connect with patients for Virtual Visits (Telemedicine).  Patients are able to view lab/test results, encounter notes, upcoming appointments, etc.  Non-urgent messages can be sent to your provider as well.   To learn more about what you can do with MyChart, go to [email or web address].    Your next appointment:   6 month(s)  The format for your next appointment:   In Person  Provider:   Rozann Lesches, MD or Carlyle Dolly, MD   Other Instructions: None     Thank you for choosing [facility] !

## 2020-11-19 ENCOUNTER — Telehealth: Payer: Self-pay | Admitting: Cardiology

## 2020-11-19 MED ORDER — NITROGLYCERIN 0.4 MG SL SUBL: 0.4000 mg | SUBLINGUAL_TABLET | SUBLINGUAL | 3 refills | Status: DC | PRN

## 2020-11-19 NOTE — Telephone Encounter (Signed)
NEW MESSAGE      *STAT* If patient is at the pharmacy, call can be transferred to refill team.   1. Which medications need to be refilled? (please list name of each medication and dose if known)  NITRO  2. Which pharmacy/location (including street and city if local pharmacy) is medication to be sent to [facility]   3. Do they need a 30 day or 90 day supply? [place]

## 2020-11-19 NOTE — Telephone Encounter (Signed)
refilled NTG

## 2021-03-29 ENCOUNTER — Other Ambulatory Visit: Payer: Self-pay | Admitting: Cardiology

## 2021-06-05 NOTE — Progress Notes (Signed)
Cardiology Office Note  Date: 06/06/2021   ID: [name], DOB 04/07/1961, MRN [id number]  PCP:  Shirline Frees, MD  Cardiologist:  Rozann Lesches, MD Electrophysiologist:  None   Chief Complaint  Patient presents with   Cardiac follow-up     History of Present Illness: [name] [name] is a 60 y.o. male last seen in October 2021.  He presents for a routine visit.  He tells me that within the last few months he has been noticeably more short of breath with activity such as walking 100 yards out to a building on his property.  He did not describe definite angina with this however.  Otherwise also feeling a "sticking" brief chest discomfort when he is watching television, nonexertional.  He states that he has been compliant with his medications.  I reviewed his medications which are noted below.  He has not had to use any nitroglycerin.  I personally reviewed his ECG today which shows sinus rhythm with right bundle branch block and PVC.  He has not undergone interval ischemic testing since PCI in 2020.  Past Medical History:  Diagnosis Date   Arthritis    Barrett's esophagus 05/22/2015   Chronic back pain    COPD (chronic obstructive pulmonary disease) (HCC)    Coronary artery disease    a. cath in 2010 showing 20% distal LM, 70-80% Ost LAD, 30% OM, and 40% RCA b. low-risk NST in 2014 and 2016 c. 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 Underwent repair of dissection and SVG-RCA.    Essential hypertension    GERD (gastroesophageal reflux disease)    Gout    Hemorrhoids    History of alcohol abuse    Remote   History of bronchitis 2015   History of kidney stones    Hyperlipidemia    Lymphocytic colitis 2009   NASH (nonalcoholic steatohepatitis)    Peripheral vascular disease ([place])    Personal history of colonic polyps 07/20/2002   Diminutive adenomas (3) 06/2002  diminutive adenoma (1) 2011    Pre-diabetes    Stroke [facility])    TIA (transient ischemic attack)    Urinary urgency     Past Surgical History:  Procedure Laterality Date   25 GAUGE PARS PLANA VITRECTOMY WITH 20 GAUGE MVR PORT N/A 03/25/2019   Procedure: WOUND VAC CHANGE AND IRRIGATION;  Surgeon: Ivin Poot, MD;  Location: [place];  Service: Thoracic;  Laterality: N/A;   ANEURYSM COILING  12/2010   cerebral   APPLICATION OF WOUND VAC N/A 03/22/2019   Procedure: APPLICATION OF WOUND VAC;  Surgeon: Ivin Poot, MD;  Location: [place];  Service: Thoracic;  Laterality: N/A;   APPLICATION OF WOUND VAC N/A 03/29/2019   Procedure: WOUND VAC CHANGE;  Surgeon: Ivin Poot, MD;  Location: [place];  Service: Thoracic;  Laterality: N/A;   APPLICATION OF WOUND VAC N/A 03/31/2019   Procedure: WOUND VAC CHANGE;  Surgeon: Ivin Poot, MD;  Location: [place];  Service: Thoracic;  Laterality: N/A;   [place]  2003   at sigmoidoscopy   CARDIAC CATHETERIZATION  2010   [place] Right 11/14/2019   Procedure: Right carpal tunnel release, Right cubital tunnel release;  Surgeon: Verner Mould, MD;  Location: [place];  Service: Orthopedics;  Laterality: Right;  block in preop  CHOLECYSTECTOMY  08/03/2015   CHOLECYSTECTOMY N/A 08/03/2015   Procedure: LAPAROSCOPIC CHOLECYSTECTOMY WITH INTRAOPERATIVE CHOLANGIOGRAM;  Surgeon: Fanny Skates, MD;  Location: [place];  Service: General;  Laterality: N/A;   COLONOSCOPY  multiple   CORONARY ARTERY BYPASS GRAFT N/A 02/23/2019   Procedure: CORONARY ARTERY BYPASS GRAFTING (CABG) x 1; Using Endoscopically Harvested Right Leg Greater Saphenous Vein Graft (SVG); SVG to RCA;  Surgeon: Ivin Poot, MD;  Location: [place];  Service: Open Heart Surgery;  Laterality: N/A;   CORONARY STENT INTERVENTION N/A 02/23/2019   Procedure: CORONARY STENT INTERVENTION;  Surgeon: Martinique, Peter M, MD;  Location: [place] CV LAB;  Service: Cardiovascular;  Laterality: N/A;   ESOPHAGOGASTRODUODENOSCOPY     KNEE ARTHROSCOPY WITH MEDIAL MENISECTOMY Left 05/20/2017   Procedure: LEFT KNEE ARTHROSCOPY WITH PARTIAL MEDIAL MENISCECTOMY;  Surgeon: Leandrew Koyanagi, MD;  Location: [place];  Service: Orthopedics;  Laterality: Left;   LEFT HEART CATH AND CORONARY ANGIOGRAPHY N/A 02/23/2019   Procedure: LEFT HEART CATH AND CORONARY ANGIOGRAPHY;  Surgeon: Martinique, Peter M, MD;  Location: [place] CV [facility];  Service: Cardiovascular;  Laterality: N/A;   LUMBAR SPINE SURGERY     x 2   NASAL SINUS SURGERY     PECTORALIS FLAP N/A 04/04/2019   Procedure: right pectoralis muscle flap for sternal wound reconstruction and VAC placement;  Surgeon: Wallace Going, DO;  Location: [place];  Service: Plastics;  Laterality: N/A;  2.5 hours case length   REPAIR OF ACUTE ASCENDING THORACIC AORTIC DISSECTION N/A 02/23/2019   Procedure: REPAIR OF TYPE A  - ACUTE ASCENDING THORACIC AORTIC DISSECTION, using Hemashield Platinum Woven Double Velour Vascular Graft (D: 38m, L: 30cm);  Surgeon: VPrescott Gum PCollier Salina MD;  [place]  Service: Vascular;  Laterality: N/A;   STERNAL WOUND DEBRIDEMENT N/A 03/22/2019   Procedure: STERNAL WOUND DEBRIDEMENT;  Surgeon: VIvin Poot MD;  [place]  Service: Thoracic;  Laterality: N/A;   STERNAL WOUND DEBRIDEMENT N/A 03/25/2019   Procedure: SUPERFICIAL STERNAL WOUND DEBRIDEMENT;  Surgeon: VIvin Poot MD;  [place]  Service: Thoracic;  Laterality: N/A;   STERNAL WOUND DEBRIDEMENT N/A 03/29/2019   Procedure: SUPERFICIAL STERNAL WOUND DEBRIDEMENT;  Surgeon: VIvin Poot MD;  [place]  Service: Thoracic;  Laterality: N/A;   STERNAL WOUND DEBRIDEMENT N/A 03/31/2019   Procedure: STERNAL WOUND DEBRIDEMENT USING 1G ACEL POWDER;  Surgeon: VIvin Poot MD;  [place]  Service: Thoracic;  Laterality: N/A;   STERNAL WOUND DEBRIDEMENT N/A 04/04/2019    Procedure: STERNAL WOUND DEBRIDEMENT;  Surgeon: VIvin Poot MD;  [place]  Service: Thoracic;  Laterality: N/A;   TEE WITHOUT CARDIOVERSION  05/28/2012   Procedure: TRANSESOPHAGEAL ECHOCARDIOGRAM (TEE);  Surgeon: BLelon Perla MD;  [facility]  Service: Cardiovascular;  Laterality: N/A;   TEE WITHOUT CARDIOVERSION N/A 02/23/2019   Procedure: TRANSESOPHAGEAL ECHOCARDIOGRAM (TEE);  Surgeon: VPrescott Gum PCollier Salina MD;  [place]  Service: Open Heart Surgery;  Laterality: N/A;    Current Outpatient Medications  Medication Sig Dispense Refill   albuterol (PROVENTIL HFA;VENTOLIN HFA) 108 (90 Base) MCG/ACT inhaler Inhale 1 puff into the lungs every 6 (six) hours as needed for wheezing or shortness of breath.      allopurinol (ZYLOPRIM) 100 MG tablet Take 100 mg by mouth daily.      amLODipine (NORVASC) 10 MG tablet Take 10 mg by mouth daily.     aspirin EC 81 MG  EC tablet Take 1 tablet (81 mg total) by mouth daily.     atorvastatin (LIPITOR) 80 MG tablet Take 1 tablet (80 mg total) by mouth daily at 6 PM. 30 tablet 2   esomeprazole (NEXIUM) 40 MG capsule Take 40 mg by mouth 2 (two) times daily.     Fluticasone-Umeclidin-Vilant (TRELEGY ELLIPTA) 200-62.5-25 MCG/INH AEPB Inhale into the lungs 2 (two) times daily.     HYDROcodone-acetaminophen (NORCO/VICODIN) 5-325 MG tablet Take 1-2 tablets by mouth every 6 (six) hours as needed. 30 tablet 0   ipratropium-albuterol (DUONEB) 0.5-2.5 (3) MG/3ML SOLN Take 3 mLs by nebulization every 8 (eight) hours as needed (wheezing, shortness of breath). 360 mL 0   isosorbide mononitrate (IMDUR) 30 MG 24 hr tablet Take 1 tablet by mouth once daily 90 tablet 3   losartan (COZAAR) 100 MG tablet Take 1 tablet (100 mg total) by mouth daily. 30 tablet 2   metoprolol tartrate (LOPRESSOR) 50 MG tablet Take 1 tablet by mouth twice daily 180 tablet 2   milk thistle 175 MG tablet Take 175 mg by mouth daily.     Multiple Vitamin (MULITIVITAMIN WITH  MINERALS) TABS Take 1 tablet by mouth daily.     naproxen (NAPROSYN) 500 MG tablet Take 500 mg by mouth 2 (two) times daily.     nitroGLYCERIN (NITROSTAT) 0.4 MG SL tablet Place 1 tablet (0.4 mg total) under the tongue every 5 (five) minutes as needed. For chest pain 25 tablet 3   Omega-3 Fatty Acids (FISH OIL) 1200 MG CPDR Take 1,200 mg by mouth daily.     spironolactone (ALDACTONE) 25 MG tablet Take 0.5 tablets (12.5 mg total) by mouth daily. 30 tablet 2   traZODone (DESYREL) 100 MG tablet Take 100 mg by mouth at bedtime.      No current facility-administered medications for this visit.   Allergies:  Penicillins   ROS: No palpitations or syncope.  Physical Exam: VS:  BP (!) 150/76 (BP Location: Right Arm)   Pulse 72   Ht 5' 7"  (1.702 m)   Wt 237 lb (107.5 kg)   SpO2 95%   BMI 37.12 kg/m , BMI Body mass index is 37.12 kg/m.  Wt Readings from Last 3 Encounters:  06/06/21 237 lb (107.5 kg)  10/11/20 246 lb (111.6 kg)  04/05/20 252 lb 12.8 oz (114.7 kg)    General: Patient appears comfortable at rest. HEENT: Conjunctiva and lids normal, wearing a mask. Neck: Supple, no elevated JVP or carotid bruits, no thyromegaly. Lungs: Clear to auscultation, nonlabored breathing at rest. Cardiac: Regular rate and rhythm, no S3, 2/6 systolic murmur, no pericardial rub. Abdomen: Soft, nontender, bowel sounds present. Extremities: No pitting edema.  ECG:  An ECG dated 04/05/2020 was personally reviewed today and demonstrated:  Normal sinus rhythm with right bundle branch block.  Recent Labwork:  February 2021: Hemoglobin A1c 6.4%, hemoglobin 14.2, platelets 182, BUN 17, creatinine 0.79, potassium 4.6, AST 29, ALT 35, TSH 0.76, cholesterol 86, triglycerides 224, HDL 28, LDL 23  Other Studies Reviewed Today:  Echocardiogram 03/28/2019:  1. The left ventricle has low normal systolic function, with an ejection fraction of 50-55%. The cavity size was normal.  2. Left atrial size was moderately  dilated.  3. Small pericardial effusion.  4. Small mostly apical pericardial effusion no signs of tamponade.  5. Mild thickening of the mitral valve leaflet. Mild calcification of the mitral valve leaflet.  6. Tricuspid valve regurgitation is mild-moderate.  7. The aortic valve is tricuspid  Mild thickening of the aortic valve Mild calcification of the aortic valve. Aortic valve regurgitation is trivial by color flow Doppler.  8. The aortic root is normal in size and structure.  9. The interatrial septum was not assessed.   Cardiac catheterization and PCI 02/23/2019: Dist LM to Ost LAD lesion is 45% stenosed. Prox Cx to Mid Cx lesion is 30% stenosed. Prox LAD lesion is 30% stenosed. Prox RCA lesion is 90% stenosed. Post intervention, there is a 0% residual stenosis. A drug-eluting stent was successfully placed using a STENT SYNERGY DES 2.5X16. A drug-eluting stent was successfully placed using a STENT SYNERGY DES 2.75X12. The left ventricular systolic function is normal. LV end diastolic pressure is normal. The left ventricular ejection fraction is 55-65% by visual estimate.   1. Single vessel obstructive CAD involving the proximal RCA 2. Normal LV function 3. Normal LVEDP 4. Successful stenting of the proximal RCA with DES x 2 5. 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   Assessment and Plan:  1.  CAD status post DES x2 to the proximal RCA in February 2020 with subsequent SVG to the RCA related to catheter induced dissection.  He reports worsening dyspnea exertion as discussed above, otherwise atypical chest pain on medical therapy.  ECG reviewed.  Plan to proceed with a follow-up echocardiogram and Lexiscan Myoview.  Continue with current therapy for now including aspirin, Norvasc, Lipitor, Cozaar, Aldactone, Imdur and Lopressor.  2.  Mixed hyperlipidemia, continues on Lipitor.  3.  CKD stage IIIb, creatinine 0.79 as of February  2021.  Medication Adjustments/Labs and Tests Ordered: Current medicines are reviewed at length with the patient today.  Concerns regarding medicines are outlined above.   Tests Ordered: Orders Placed This Encounter  Procedures   NM Myocar Multi W/Spect W/Wall Motion / EF   ECHOCARDIOGRAM COMPLETE    Medication Changes: No orders of the defined types were placed in this encounter.   Disposition:  Follow up  test results.  Signed, Satira Sark, MD, [facility] 06/06/2021 2:34 PM    [facility] at [facility] [address] [place], [place] [address] Phone: [phone]; Fax: [phone]

## 2021-06-06 ENCOUNTER — Encounter: Payer: Self-pay | Admitting: Cardiology

## 2021-06-06 ENCOUNTER — Ambulatory Visit (INDEPENDENT_AMBULATORY_CARE_PROVIDER_SITE_OTHER): Payer: Medicare Other | Admitting: Cardiology

## 2021-06-06 ENCOUNTER — Other Ambulatory Visit: Payer: Self-pay

## 2021-06-06 VITALS — BP 150/76 | HR 72 | Ht 67.0 in | Wt 237.0 lb

## 2021-06-06 DIAGNOSIS — E782 Mixed hyperlipidemia: Secondary | ICD-10-CM

## 2021-06-06 DIAGNOSIS — R0609 Other forms of dyspnea: Secondary | ICD-10-CM

## 2021-06-06 DIAGNOSIS — R0600 Dyspnea, unspecified: Secondary | ICD-10-CM

## 2021-06-06 DIAGNOSIS — I25119 Atherosclerotic heart disease of native coronary artery with unspecified angina pectoris: Secondary | ICD-10-CM

## 2021-06-06 NOTE — Patient Instructions (Signed)
Medication Instructions:  Your physician recommends that you continue on your current medications as directed. Please refer to the Current Medication list given to you today.  *If you need a refill on your cardiac medications before your next appointment, please call your pharmacy*   Lab Work: None today If you have labs (blood work) drawn today and your tests are completely normal, you will receive your results only by: [place] (if you have MyChart) OR A paper copy in the mail If you have any lab test that is abnormal or we need to change your treatment, we will call you to review the results.   Testing/Procedures: Your physician has requested that you have an echocardiogram. Echocardiography is a painless test that uses sound waves to create images of your heart. It provides your doctor with information about the size and shape of your heart and how well your heart's chambers and valves are working. This procedure takes approximately one hour. There are no restrictions for this procedure. Your physician has requested that you have a lexiscan myoview. For further information please visit [email or web address]. Please follow instruction sheet, as given.    Follow-Up: At [facility], you and your health needs are our priority.  As part of our continuing mission to provide you with exceptional heart care, we have created designated Provider Care Teams.  These Care Teams include your primary Cardiologist (physician) and Advanced Practice Providers (APPs -  Physician Assistants and Nurse Practitioners) who all work together to provide you with the care you need, when you need it.  We recommend signing up for the patient portal called "MyChart".  Sign up information is provided on this After Visit Summary.  MyChart is used to connect with patients for Virtual Visits (Telemedicine).  Patients are able to view lab/test results, encounter notes, upcoming appointments, etc.  Non-urgent  messages can be sent to your provider as well.   To learn more about what you can do with MyChart, go to [email or web address].       We will call you with test results. Follow up will be determined aftewords.

## 2021-06-06 NOTE — Addendum Note (Signed)
Addended by: Levonne Hubert on: 06/06/2021 04:08 PM   Modules accepted: Orders

## 2021-06-07 ENCOUNTER — Other Ambulatory Visit: Payer: Self-pay | Admitting: Cardiology

## 2021-06-24 ENCOUNTER — Ambulatory Visit (HOSPITAL_COMMUNITY)
Admission: RE | Admit: 2021-06-24 | Discharge: 2021-06-24 | Disposition: A | Discharge status: Home / Self Care | Payer: Medicare Other | Source: Ambulatory Visit | Attending: Cardiology | Admitting: Cardiology

## 2021-06-24 ENCOUNTER — Encounter (HOSPITAL_COMMUNITY)
Admission: RE | Admit: 2021-06-24 | Discharge: 2021-06-24 | Disposition: A | Discharge status: Home / Self Care | Payer: Medicare Other | Source: Ambulatory Visit | Attending: Cardiology | Admitting: Cardiology

## 2021-06-24 ENCOUNTER — Other Ambulatory Visit: Payer: Self-pay

## 2021-06-24 DIAGNOSIS — R0609 Other forms of dyspnea: Secondary | ICD-10-CM

## 2021-06-24 DIAGNOSIS — I25119 Atherosclerotic heart disease of native coronary artery with unspecified angina pectoris: Secondary | ICD-10-CM | POA: Diagnosis not present

## 2021-06-24 DIAGNOSIS — R0600 Dyspnea, unspecified: Secondary | ICD-10-CM | POA: Insufficient documentation

## 2021-06-24 LAB — NM MYOCAR MULTI W/SPECT W/WALL MOTION / EF
LV dias vol: 106 mL (ref 62–150)
LV sys vol: 43 mL
Peak HR: 82 {beats}/min
RATE: 0.33
Rest HR: 61 {beats}/min
SDS: 1
SRS: 0
SSS: 1
TID: 1.3

## 2021-06-24 LAB — ECHOCARDIOGRAM COMPLETE
Area-P 1/2: 2.48 cm2
S' Lateral: 3.2 cm

## 2021-06-24 MED ORDER — AMINOPHYLLINE 25 MG/ML IV SOLN
INTRAVENOUS | Status: AC
  Filled 2021-06-24: qty 10

## 2021-06-24 MED ORDER — TECHNETIUM TC 99M TETROFOSMIN IV KIT
30.0000 | PACK | Freq: Once | INTRAVENOUS | Status: AC | PRN
  Administered 2021-06-24: 27 via INTRAVENOUS

## 2021-06-24 MED ORDER — SODIUM CHLORIDE FLUSH 0.9 % IV SOLN
INTRAVENOUS | Status: AC
  Administered 2021-06-24: 10 mL via INTRAVENOUS
  Filled 2021-06-24: qty 10

## 2021-06-24 MED ORDER — TECHNETIUM TC 99M TETROFOSMIN IV KIT
10.0000 | PACK | Freq: Once | INTRAVENOUS | Status: AC | PRN
  Administered 2021-06-24: 9 via INTRAVENOUS

## 2021-06-24 MED ORDER — REGADENOSON 0.4 MG/5ML IV SOLN
INTRAVENOUS | Status: AC
  Administered 2021-06-24: 0.4 mg via INTRAVENOUS
  Filled 2021-06-24: qty 5

## 2021-06-24 NOTE — Progress Notes (Signed)
I.V. removed and pressure applied with a 2x2 gauze and coflex bandage at 1040. Site was clean, dry and intact.  Alvino Chapel, RCS

## 2021-06-24 NOTE — Progress Notes (Signed)
*  PRELIMINARY RESULTS* Echocardiogram 2D Echocardiogram has been performed.  Samuel Germany 06/24/2021, 11:46 AM

## 2021-07-23 DIAGNOSIS — I2510 Atherosclerotic heart disease of native coronary artery without angina pectoris: Secondary | ICD-10-CM | POA: Diagnosis not present

## 2021-07-23 DIAGNOSIS — G894 Chronic pain syndrome: Secondary | ICD-10-CM | POA: Diagnosis not present

## 2021-07-23 DIAGNOSIS — E1169 Type 2 diabetes mellitus with other specified complication: Secondary | ICD-10-CM | POA: Diagnosis not present

## 2021-07-23 DIAGNOSIS — E7800 Pure hypercholesterolemia, unspecified: Secondary | ICD-10-CM | POA: Diagnosis not present

## 2021-07-23 DIAGNOSIS — R310 Gross hematuria: Secondary | ICD-10-CM | POA: Diagnosis not present

## 2021-07-23 DIAGNOSIS — I10 Essential (primary) hypertension: Secondary | ICD-10-CM | POA: Diagnosis not present

## 2021-10-22 DIAGNOSIS — Z23 Encounter for immunization: Secondary | ICD-10-CM | POA: Diagnosis not present

## 2021-12-06 ENCOUNTER — Other Ambulatory Visit: Payer: Self-pay | Admitting: Cardiology

## 2022-01-27 DIAGNOSIS — E1169 Type 2 diabetes mellitus with other specified complication: Secondary | ICD-10-CM | POA: Diagnosis not present

## 2022-01-27 DIAGNOSIS — E7800 Pure hypercholesterolemia, unspecified: Secondary | ICD-10-CM | POA: Diagnosis not present

## 2022-01-27 DIAGNOSIS — J449 Chronic obstructive pulmonary disease, unspecified: Secondary | ICD-10-CM | POA: Diagnosis not present

## 2022-01-27 DIAGNOSIS — I10 Essential (primary) hypertension: Secondary | ICD-10-CM | POA: Diagnosis not present

## 2022-01-27 DIAGNOSIS — I2510 Atherosclerotic heart disease of native coronary artery without angina pectoris: Secondary | ICD-10-CM | POA: Diagnosis not present

## 2022-01-27 DIAGNOSIS — F5101 Primary insomnia: Secondary | ICD-10-CM | POA: Diagnosis not present

## 2022-01-27 DIAGNOSIS — G894 Chronic pain syndrome: Secondary | ICD-10-CM | POA: Diagnosis not present

## 2022-03-07 ENCOUNTER — Other Ambulatory Visit: Payer: Self-pay | Admitting: Cardiology

## 2022-03-07 ENCOUNTER — Telehealth: Payer: Self-pay | Admitting: Cardiology

## 2022-03-07 MED ORDER — METOPROLOL TARTRATE 50 MG PO TABS: 50.0000 mg | ORAL_TABLET | Freq: Two times a day (BID) | ORAL | 3 refills | Status: DC

## 2022-03-07 NOTE — Telephone Encounter (Signed)
Refill complete 

## 2022-03-07 NOTE — Telephone Encounter (Signed)
?*  STAT* If patient is at the pharmacy, call can be transferred to refill team. ? ? ?1. Which medications need to be refilled? (please list name of each medication and dose if known) metoprolol tartrate (LOPRESSOR) 50 MG tablet ? ?2. Which pharmacy/location (including street and city if local pharmacy) is medication to be sent to? ?[place], [place] - [address] ? ?3. Do they need a 30 day or 90 day supply? 90 ds ? ?No medication remaining  ?

## 2022-03-18 ENCOUNTER — Other Ambulatory Visit: Payer: Self-pay | Admitting: Cardiology

## 2022-04-28 ENCOUNTER — Ambulatory Visit: Payer: Medicare Other | Admitting: Cardiology

## 2022-04-29 ENCOUNTER — Encounter: Payer: Self-pay | Admitting: Cardiology

## 2022-06-06 ENCOUNTER — Other Ambulatory Visit: Payer: Self-pay | Admitting: Cardiology

## 2022-07-21 ENCOUNTER — Ambulatory Visit (INDEPENDENT_AMBULATORY_CARE_PROVIDER_SITE_OTHER): Payer: Medicaid Other | Admitting: Physician Assistant

## 2022-07-21 ENCOUNTER — Encounter: Payer: Self-pay | Admitting: Physician Assistant

## 2022-07-21 VITALS — BP 134/80 | HR 68 | Ht 67.0 in | Wt 219.0 lb

## 2022-07-21 DIAGNOSIS — I10 Essential (primary) hypertension: Secondary | ICD-10-CM | POA: Diagnosis not present

## 2022-07-21 DIAGNOSIS — E7849 Other hyperlipidemia: Secondary | ICD-10-CM

## 2022-07-21 DIAGNOSIS — N183 Chronic kidney disease, stage 3 unspecified: Secondary | ICD-10-CM

## 2022-07-21 DIAGNOSIS — I2510 Atherosclerotic heart disease of native coronary artery without angina pectoris: Secondary | ICD-10-CM | POA: Diagnosis not present

## 2022-07-21 NOTE — Patient Instructions (Signed)
Medication Instructions:  Your physician recommends that you continue on your current medications as directed. Please refer to the Current Medication list given to you today.  *If you need a refill on your cardiac medications before your next appointment, please call your pharmacy*   Lab Work: NONE   If you have labs (blood work) drawn today and your tests are completely normal, you will receive your results only by: [place] (if you have MyChart) OR A paper copy in the mail If you have any lab test that is abnormal or we need to change your treatment, we will call you to review the results.   Testing/Procedures: NONE    Follow-Up: At [facility], you and your health needs are our priority.  As part of our continuing mission to provide you with exceptional heart care, we have created designated Provider Care Teams.  These Care Teams include your primary Cardiologist (physician) and Advanced Practice Providers (APPs -  Physician Assistants and Nurse Practitioners) who all work together to provide you with the care you need, when you need it.  We recommend signing up for the patient portal called "MyChart".  Sign up information is provided on this After Visit Summary.  MyChart is used to connect with patients for Virtual Visits (Telemedicine).  Patients are able to view lab/test results, encounter notes, upcoming appointments, etc.  Non-urgent messages can be sent to your provider as well.   To learn more about what you can do with MyChart, go to [email or web address].    Your next appointment:   1 year(s)  The format for your next appointment:   In Person  Provider:   Rozann Lesches, MD    Other Instructions  Please do 150 minutes of exercise per week   Smoking Tobacco Information, Adult Smoking tobacco can be harmful to your health. Tobacco contains a toxic colorless chemical called nicotine. Nicotine causes changes in your brain that make you want more and more.  This is called addiction. This can make it hard to stop smoking once you start. Tobacco also has other toxic chemicals that can hurt your body and raise your risk of many cancers. Menthol or "lite" tobacco or cigarette brands are not safer than regular brands. How can smoking tobacco affect me? Smoking tobacco puts you at risk for: Cancer. Smoking is most commonly associated with lung cancer, but can also lead to cancer in other parts of the body. Chronic obstructive pulmonary disease (COPD). This is a long-term lung condition that makes it hard to breathe. It also gets worse over time. High blood pressure (hypertension), heart disease, stroke, heart attack, and lung infections, such as pneumonia. Cataracts. This is when the lenses in the eyes become clouded. Digestive problems. This may include peptic ulcers, heartburn, and gastroesophageal reflux disease (GERD). Oral health problems, such as gum disease, mouth sores, and tooth loss. Loss of taste and smell. Smoking also affects how you look and smell. Smoking may cause: Wrinkles. Yellow or stained teeth, fingers, and fingernails. Bad breath. Bad-smelling clothes and hair. Smoking tobacco can also affect your social life, because: It may be challenging to find places to smoke when away from home. Many workplaces, [employer], hotels, and public places are tobacco-free. Smoking is expensive. This is due to the cost of tobacco and the long-term costs of treating health problems from smoking. Secondhand smoke may affect those around you. Secondhand smoke can cause lung cancer, breathing problems, and heart disease. Children of smokers have a higher risk for: Sudden  infant death syndrome (SIDS). Ear infections. Lung infections. What actions can I take to prevent health problems? Quit smoking  Do not start smoking. Quit if you already smoke. Do not replace cigarette smoking with vaping devices, such as e-cigarettes. Make a plan to quit  smoking and commit to it. Look for programs to help you, and ask your health care provider for recommendations and ideas. Set a date and write down all the reasons you want to quit. Let your friends and family know you are quitting so they can help and support you. Consider finding friends who also want to quit. It can be easier to quit with someone else, so that you can support each other. Talk with your health care provider about using nicotine replacement medicines to help you quit. These include gum, lozenges, patches, sprays, or pills. If you try to quit but return to smoking, stay positive. It is common to slip up when you first quit, so take it one day at a time. Be prepared for cravings. When you feel the urge to smoke, chew gum or suck on hard candy. Lifestyle Stay busy. Take care of your body. Get plenty of exercise, eat a healthy diet, and drink plenty of water. Find ways to manage your stress, such as meditation, yoga, exercise, or time spent with friends and family. Ask your health care provider about having regular tests (screenings) to check for cancer. This may include blood tests, imaging tests, and other tests. Where to find support To get support to quit smoking, consider: Asking your health care provider for more information and resources. Joining a support group for people who want to quit smoking in your local community. There are many effective programs that may help you to quit. Calling the smokefree.gov counselor helpline at 1-800-QUIT-NOW 785 646 4013). Where to find more information You may find more information about quitting smoking from: Centers for Disease Control and Prevention: https://www.chang-huffman.com/ https://hall.com/: smokefree.gov American Lung Association: freedomfromsmoking.org Contact a health care provider if: You have problems breathing. Your lips, nose, or fingers turn blue. You have chest pain. You are coughing up blood. You feel like you will faint. You  have other health changes that cause you to worry. Summary Smoking tobacco can negatively affect your health, the health of those around you, your finances, and your social life. Do not start smoking. Quit if you already smoke. If you need help quitting, ask your health care provider. Consider joining a support group for people in your local community who want to quit smoking. There are many effective programs that may help you to quit. This information is not intended to replace advice given to you by your health care provider. Make sure you discuss any questions you have with your health care provider. Document Revised: 12/10/2021 Document Reviewed: 12/10/2021 Elsevier Patient Education  [place]

## 2022-07-21 NOTE — Progress Notes (Signed)
Cardiology Office Note    Date:  07/21/2022   ID:  [name] [name] [birth year], MRN [id number]   PCP:  Shirline Frees, [place]  Cardiologist:  Rozann Lesches, MD   Advanced Practice Provider:  No care team member to display Electrophysiologist:  None   [phone]   No chief complaint on file.   History of Present Illness:  [name] [name] is a 61 y.o. 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  Patient last saw Dr. [name] 06/06/2021 complaining of dyspnea on exertion and atypical chest pain.  Lexiscan Myoview was low risk no ischemia, 2D echo normal LVEF 60 to 65% with mild LVH.  Patient comes in for yearly f/u. His wife died 2 months ago after hip surgery and a heart attack and then bled out from a gyn cancer. Doing grief counseling through hospice. He is back to smoking a pack every 1 1/2 days. Denies chest pain, dyspnea, palpitations. Says he walks a lot but no regular exercise.   Past Medical History:  Diagnosis Date   Arthritis    Barrett's esophagus 05/22/2015   Chronic back pain    COPD (chronic obstructive pulmonary disease) (HCC)    Coronary artery disease    a. cath in 2010 showing 20% distal LM, 70-80% Ost LAD, 30% OM, and 40% RCA b. low-risk NST in 2014 and 2016 c. 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 Underwent repair of dissection and SVG-RCA.    Essential hypertension    GERD (gastroesophageal reflux disease)    Gout    Hemorrhoids    History of alcohol abuse    Remote   History of bronchitis 2015   History of kidney stones    Hyperlipidemia    Lymphocytic colitis 2009   NASH (nonalcoholic steatohepatitis)    Peripheral vascular disease  ([place])    Personal history of colonic polyps 07/20/2002   Diminutive adenomas (3) 06/2002 diminutive adenoma (1) 2011    Pre-diabetes    Stroke [facility])    TIA (transient ischemic attack)    Urinary urgency     Past Surgical History:  Procedure Laterality Date   25 GAUGE PARS PLANA VITRECTOMY WITH 20 GAUGE MVR PORT N/A 03/25/2019   Procedure: WOUND VAC CHANGE AND IRRIGATION;  Surgeon: Ivin Poot, MD;  Location: [place];  Service: Thoracic;  Laterality: N/A;   ANEURYSM COILING  12/2010   cerebral   APPLICATION OF WOUND VAC N/A 03/22/2019   Procedure: APPLICATION OF WOUND VAC;  Surgeon: Ivin Poot, MD;  Location: [place];  Service: Thoracic;  Laterality: N/A;   APPLICATION OF WOUND VAC N/A 03/29/2019   Procedure: WOUND VAC CHANGE;  Surgeon: Ivin Poot, MD;  Location: [place];  Service: Thoracic;  Laterality: N/A;   APPLICATION OF WOUND VAC N/A 03/31/2019   Procedure: WOUND VAC CHANGE;  Surgeon: Ivin Poot, MD;  Location: [place];  Service: Thoracic;  Laterality: N/A;   [place]  2003   at sigmoidoscopy   CARDIAC CATHETERIZATION  2010   [place] Right 11/14/2019   Procedure: Right carpal tunnel release, Right cubital tunnel release;  Surgeon: Avanell Shackleton  III, MD;  Location: [place];  Service: Orthopedics;  Laterality: Right;  block in preop   CHOLECYSTECTOMY  08/03/2015   CHOLECYSTECTOMY N/A 08/03/2015   Procedure: LAPAROSCOPIC CHOLECYSTECTOMY WITH INTRAOPERATIVE CHOLANGIOGRAM;  Surgeon: Fanny Skates, MD;  Location: [place];  Service: General;  Laterality: N/A;   COLONOSCOPY  multiple   CORONARY ARTERY BYPASS GRAFT N/A 02/23/2019   Procedure: CORONARY ARTERY BYPASS GRAFTING (CABG) x 1; Using Endoscopically Harvested Right Leg Greater Saphenous Vein Graft (SVG); SVG to RCA;  Surgeon: Ivin Poot, MD;  Location: [place];  Service: Open Heart Surgery;  Laterality: N/A;   CORONARY STENT INTERVENTION N/A 02/23/2019    Procedure: CORONARY STENT INTERVENTION;  Surgeon: Martinique, Peter M, MD;  Location: [place] [facility];  Service: Cardiovascular;  Laterality: N/A;   ESOPHAGOGASTRODUODENOSCOPY     KNEE ARTHROSCOPY WITH MEDIAL MENISECTOMY Left 05/20/2017   Procedure: LEFT KNEE ARTHROSCOPY WITH PARTIAL MEDIAL MENISCECTOMY;  Surgeon: Leandrew Koyanagi, MD;  Location: [place];  Service: Orthopedics;  Laterality: Left;   LEFT HEART CATH AND CORONARY ANGIOGRAPHY N/A 02/23/2019   Procedure: LEFT HEART CATH AND CORONARY ANGIOGRAPHY;  Surgeon: Martinique, Peter M, MD;  Location: [place] [facility];  Service: Cardiovascular;  Laterality: N/A;   LUMBAR SPINE SURGERY     x 2   NASAL SINUS SURGERY     PECTORALIS FLAP N/A 04/04/2019   Procedure: right pectoralis muscle flap for sternal wound reconstruction and VAC placement;  Surgeon: Wallace Going, DO;  Location: [place];  Service: Plastics;  Laterality: N/A;  2.5 hours case length   REPAIR OF ACUTE ASCENDING THORACIC AORTIC DISSECTION N/A 02/23/2019   Procedure: REPAIR OF TYPE A  - ACUTE ASCENDING THORACIC AORTIC DISSECTION, using Hemashield Platinum Woven Double Velour Vascular Graft (D: 65m, L: 30cm);  Surgeon: VPrescott Gum PCollier Salina MD;  [place]  Service: Vascular;  Laterality: N/A;   STERNAL WOUND DEBRIDEMENT N/A 03/22/2019   Procedure: STERNAL WOUND DEBRIDEMENT;  Surgeon: VIvin Poot MD;  [place]  Service: Thoracic;  Laterality: N/A;   STERNAL WOUND DEBRIDEMENT N/A 03/25/2019   Procedure: SUPERFICIAL STERNAL WOUND DEBRIDEMENT;  Surgeon: VIvin Poot MD;  [place]  Service: Thoracic;  Laterality: N/A;   STERNAL WOUND DEBRIDEMENT N/A 03/29/2019   Procedure: SUPERFICIAL STERNAL WOUND DEBRIDEMENT;  Surgeon: VIvin Poot MD;  [place]  Service: Thoracic;  Laterality: N/A;   STERNAL WOUND DEBRIDEMENT N/A 03/31/2019   Procedure: STERNAL WOUND DEBRIDEMENT USING 1G ACEL POWDER;  Surgeon: VIvin Poot MD;  [place]   Service: Thoracic;  Laterality: N/A;   STERNAL WOUND DEBRIDEMENT N/A 04/04/2019   Procedure: STERNAL WOUND DEBRIDEMENT;  Surgeon: VIvin Poot MD;  [place]  Service: Thoracic;  Laterality: N/A;   TEE WITHOUT CARDIOVERSION  05/28/2012   Procedure: TRANSESOPHAGEAL ECHOCARDIOGRAM (TEE);  Surgeon: BLelon Perla MD;  [facility]  Service: Cardiovascular;  Laterality: N/A;   TEE WITHOUT CARDIOVERSION N/A 02/23/2019   Procedure: TRANSESOPHAGEAL ECHOCARDIOGRAM (TEE);  Surgeon: VPrescott Gum PCollier Salina MD;  [place] [facility]  Service: Open Heart Surgery;  Laterality: N/A;    Current Medications: Current Meds  Medication Sig   albuterol (PROVENTIL HFA;VENTOLIN HFA) 108 (90 Base) MCG/ACT inhaler Inhale 1 puff into the lungs every 6 (six) hours as needed for wheezing or shortness of breath.    allopurinol (ZYLOPRIM) 100 MG tablet Take 100 mg by mouth daily.    amLODipine (NORVASC) 10 MG  tablet Take 10 mg by mouth daily.   aspirin EC 81 MG EC tablet Take 1 tablet (81 mg total) by mouth daily.   atorvastatin (LIPITOR) 80 MG tablet Take 1 tablet (80 mg total) by mouth daily at 6 PM.   esomeprazole (NEXIUM) 40 MG capsule Take 40 mg by mouth 2 (two) times daily.   Fluticasone-Umeclidin-Vilant (TRELEGY ELLIPTA) 200-62.5-25 MCG/INH AEPB Inhale into the lungs 2 (two) times daily.   HYDROcodone-acetaminophen (NORCO/VICODIN) 5-325 MG tablet Take 1-2 tablets by mouth every 6 (six) hours as needed.   ipratropium-albuterol (DUONEB) 0.5-2.5 (3) MG/3ML SOLN Take 3 mLs by nebulization every 8 (eight) hours as needed (wheezing, shortness of breath).   isosorbide mononitrate (IMDUR) 30 MG 24 hr tablet Take 1 tablet by mouth once daily   losartan (COZAAR) 100 MG tablet Take 1 tablet (100 mg total) by mouth daily.   metoprolol tartrate (LOPRESSOR) 50 MG tablet Take 1 tablet (50 mg total) by mouth 2 (two) times daily.   milk thistle 175 MG tablet Take 175 mg by mouth daily.   Multiple Vitamin  (MULITIVITAMIN WITH MINERALS) TABS Take 1 tablet by mouth daily.   naproxen (NAPROSYN) 500 MG tablet Take 500 mg by mouth 2 (two) times daily.   nitroGLYCERIN (NITROSTAT) 0.4 MG SL tablet Place 1 tablet (0.4 mg total) under the tongue every 5 (five) minutes as needed. For chest pain   Omega-3 Fatty Acids (FISH OIL) 1200 MG CPDR Take 1,200 mg by mouth daily.   spironolactone (ALDACTONE) 25 MG tablet Take 0.5 tablets (12.5 mg total) by mouth daily.   traZODone (DESYREL) 100 MG tablet Take 100 mg by mouth at bedtime.      Allergies:   Penicillins   Social History   Socioeconomic History   Marital status: Widowed    Spouse name: Not on file   Number of children: 3   Years of education: Not on file   Highest education level: Not on file  Occupational History   Occupation: Disabled    Employer: UNEMPLOYED  Tobacco Use   Smoking status: Every Day    Packs/day: 0.50    Years: 4.00    Total pack years: 2.00    Types: Cigarettes   Smokeless tobacco: Never  Vaping Use   Vaping Use: Never used  Substance and Sexual Activity   Alcohol use: No    Alcohol/week: 0.0 standard drinks of alcohol    Comment: no alcohol in 38yr    Drug use: No   Sexual activity: Not on file  Other Topics Concern   Not on file  Social History Narrative   Not on file   Social Determinants of Health   Financial Resource Strain: Not on file  Food Insecurity: Not on file  Transportation Needs: Not on file  Physical Activity: Not on file  Stress: Not on file  Social Connections: Not on file     Family History:  The patient's  family history includes Breast cancer in his mother; Cancer in his father and mother; Diabetes in his paternal grandmother and sister; Heart attack in his father and mother; Heart disease in his father and another family member; Hyperlipidemia in his father and sister; Hypertension in his brother, father, mother, and sister; Liver cancer in his mother; Liver disease in his mother.    ROS:   Please see the history of present illness.    ROS All other systems reviewed and are negative.   PHYSICAL EXAM:   VS:  BP 134/80  Pulse 68   Ht 5' 7"  (1.702 m)   Wt 219 lb (99.3 kg)   SpO2 96%   BMI 34.30 kg/m   Physical Exam  GEN: Obese, in no acute distress  Neck: no JVD, carotid bruits, or masses Cardiac:RRR; no murmurs, rubs, or gallops  Respiratory:  clear to auscultation bilaterally, normal work of breathing GI: soft, nontender, nondistended, + BS Ext: without cyanosis, clubbing, or edema, Good distal pulses bilaterally Neuro:  Alert and Oriented x 3, Psych: euthymic mood, full affect  Wt Readings from Last 3 Encounters:  07/21/22 219 lb (99.3 kg)  06/06/21 237 lb (107.5 kg)  10/11/20 246 lb (111.6 kg)      Studies/Labs Reviewed:   EKG:  EKG is not ordered today.     Recent Labs: No results found for requested labs within last 365 days.   Lipid Panel    Component Value Date/Time   CHOL 156 02/23/2019 1314   TRIG 101 02/23/2019 1314   HDL 31 (L) 02/23/2019 1314   CHOLHDL 5.0 02/23/2019 1314   VLDL 20 02/23/2019 1314   LDLCALC 105 (H) 02/23/2019 1314    Additional studies/ records that were reviewed today include:  2D echo 06/24/2021 IMPRESSIONS     1. Left ventricular ejection fraction, by estimation, is 60 to 65%. The  left ventricle has normal function. The left ventricle has no regional  wall motion abnormalities. There is mild left ventricular hypertrophy.  Left ventricular diastolic parameters  were normal.   2. Right ventricular systolic function is normal. The right ventricular  size is normal. Tricuspid regurgitation signal is inadequate for assessing  PA pressure.   3. The mitral valve is grossly normal. No evidence of mitral valve  regurgitation.   4. The aortic valve is tricuspid. Aortic valve regurgitation is not  visualized.   5. The inferior vena cava is normal in size with greater than 50%  respiratory variability,  suggesting right atrial pressure of 3 mmHg.   Lexiscan Myoview 06/24/2021 No diagnostic ST segment changes to indicate ischemia. Small, mild intensity, fixed mid to basal inferior defect that is most consistent with diaphragmatic attenuation. No ischemic territories. This is a low risk study. Nuclear stress EF: 59%.     Risk Assessment/Calculations:         ASSESSMENT:    1. Coronary artery disease involving native coronary artery of native heart without angina pectoris   2. Essential hypertension   3. Other hyperlipidemia   4. Stage 3 chronic kidney disease, unspecified whether stage 3a or 3b CKD ([place])      PLAN:  In order of problems listed above   CAD status post DES x2 to the proximal RCA in February 2020 with subsequent SVG to the RCA related to catheter induced dissection, low risk Myoview 05/2021. No angina, continue amlodipine, aspirin, atorvastatin, Imdur, metoprolol. 150 min exercise weekly.  Hypertension also on losartan, spironolactone in addition to the above meds blood pressure controlled today  Hyperlipidemia on atorvastatin for fasting lipid panel by PCP in Aug  CKD stage IIIb for labs in Aug.  Tobacco abuse-smoking cessation discussed in detail.  Shared Decision Making/Informed Consent        Medication Adjustments/Labs and Tests Ordered: Current medicines are reviewed at length with the patient today.  Concerns regarding medicines are outlined above.  Medication changes, Labs and Tests ordered today are listed in the Patient Instructions below. Patient Instructions  Medication Instructions:  Your physician recommends that you continue on your current  medications as directed. Please refer to the Current Medication list given to you today.  *If you need a refill on your cardiac medications before your next appointment, please call your pharmacy*   Lab Work: NONE   If you have labs (blood work) drawn today and your tests are completely normal, you  will receive your results only by: [place] (if you have MyChart) OR A paper copy in the mail If you have any lab test that is abnormal or we need to change your treatment, we will call you to review the results.   Testing/Procedures: NONE    Follow-Up: At [facility], you and your health needs are our priority.  As part of our continuing mission to provide you with exceptional heart care, we have created designated Provider Care Teams.  These Care Teams include your primary Cardiologist (physician) and Advanced Practice Providers (APPs -  Physician Assistants and Nurse Practitioners) who all work together to provide you with the care you need, when you need it.  We recommend signing up for the patient portal called "MyChart".  Sign up information is provided on this After Visit Summary.  MyChart is used to connect with patients for Virtual Visits (Telemedicine).  Patients are able to view lab/test results, encounter notes, upcoming appointments, etc.  Non-urgent messages can be sent to your provider as well.   To learn more about what you can do with MyChart, go to [email or web address].    Your next appointment:   1 year(s)  The format for your next appointment:   In Person  Provider:   Rozann Lesches, MD    Other Instructions  Please do 150 minutes of exercise per week   Smoking Tobacco Information, Adult Smoking tobacco can be harmful to your health. Tobacco contains a toxic colorless chemical called nicotine. Nicotine causes changes in your brain that make you want more and more. This is called addiction. This can make it hard to stop smoking once you start. Tobacco also has other toxic chemicals that can hurt your body and raise your risk of many cancers. Menthol or "lite" tobacco or cigarette brands are not safer than regular brands. How can smoking tobacco affect me? Smoking tobacco puts you at risk for: Cancer. Smoking is most commonly associated with lung  cancer, but can also lead to cancer in other parts of the body. Chronic obstructive pulmonary disease (COPD). This is a long-term lung condition that makes it hard to breathe. It also gets worse over time. High blood pressure (hypertension), heart disease, stroke, heart attack, and lung infections, such as pneumonia. Cataracts. This is when the lenses in the eyes become clouded. Digestive problems. This may include peptic ulcers, heartburn, and gastroesophageal reflux disease (GERD). Oral health problems, such as gum disease, mouth sores, and tooth loss. Loss of taste and smell. Smoking also affects how you look and smell. Smoking may cause: Wrinkles. Yellow or stained teeth, fingers, and fingernails. Bad breath. Bad-smelling clothes and hair. Smoking tobacco can also affect your social life, because: It may be challenging to find places to smoke when away from home. Many workplaces, [employer], hotels, and public places are tobacco-free. Smoking is expensive. This is due to the cost of tobacco and the long-term costs of treating health problems from smoking. Secondhand smoke may affect those around you. Secondhand smoke can cause lung cancer, breathing problems, and heart disease. Children of smokers have a higher risk for: Sudden infant death syndrome (SIDS). Ear infections. Lung infections. What actions can  I take to prevent health problems? Quit smoking  Do not start smoking. Quit if you already smoke. Do not replace cigarette smoking with vaping devices, such as e-cigarettes. Make a plan to quit smoking and commit to it. Look for programs to help you, and ask your health care provider for recommendations and ideas. Set a date and write down all the reasons you want to quit. Let your friends and family know you are quitting so they can help and support you. Consider finding friends who also want to quit. It can be easier to quit with someone else, so that you can support each  other. Talk with your health care provider about using nicotine replacement medicines to help you quit. These include gum, lozenges, patches, sprays, or pills. If you try to quit but return to smoking, stay positive. It is common to slip up when you first quit, so take it one day at a time. Be prepared for cravings. When you feel the urge to smoke, chew gum or suck on hard candy. Lifestyle Stay busy. Take care of your body. Get plenty of exercise, eat a healthy diet, and drink plenty of water. Find ways to manage your stress, such as meditation, yoga, exercise, or time spent with friends and family. Ask your health care provider about having regular tests (screenings) to check for cancer. This may include blood tests, imaging tests, and other tests. Where to find support To get support to quit smoking, consider: Asking your health care provider for more information and resources. Joining a support group for people who want to quit smoking in your local community. There are many effective programs that may help you to quit. Calling the smokefree.gov counselor helpline at 1-800-QUIT-NOW 847 864 1432). Where to find more information You may find more information about quitting smoking from: Centers for Disease Control and Prevention: https://www.chang-huffman.com/ https://hall.com/: smokefree.gov American Lung Association: freedomfromsmoking.org Contact a health care provider if: You have problems breathing. Your lips, nose, or fingers turn blue. You have chest pain. You are coughing up blood. You feel like you will faint. You have other health changes that cause you to worry. Summary Smoking tobacco can negatively affect your health, the health of those around you, your finances, and your social life. Do not start smoking. Quit if you already smoke. If you need help quitting, ask your health care provider. Consider joining a support group for people in your local community who want to quit smoking. There  are many effective programs that may help you to quit. This information is not intended to replace advice given to you by your health care provider. Make sure you discuss any questions you have with your health care provider. Document Revised: 12/10/2021 Document Reviewed: 12/10/2021 Elsevier Patient Education  [place]         Signed, Ermalinda Barrios, PA-C  07/21/2022 1:32 PM    [facility] [address], [place], [place]  [address] Phone: [phone]; Fax: [phone]

## 2022-08-06 DIAGNOSIS — J449 Chronic obstructive pulmonary disease, unspecified: Secondary | ICD-10-CM | POA: Diagnosis not present

## 2022-08-06 DIAGNOSIS — M109 Gout, unspecified: Secondary | ICD-10-CM | POA: Diagnosis not present

## 2022-08-06 DIAGNOSIS — R310 Gross hematuria: Secondary | ICD-10-CM | POA: Diagnosis not present

## 2022-08-06 DIAGNOSIS — I10 Essential (primary) hypertension: Secondary | ICD-10-CM | POA: Diagnosis not present

## 2022-08-06 DIAGNOSIS — E1169 Type 2 diabetes mellitus with other specified complication: Secondary | ICD-10-CM | POA: Diagnosis not present

## 2022-08-06 DIAGNOSIS — G894 Chronic pain syndrome: Secondary | ICD-10-CM | POA: Diagnosis not present

## 2022-08-06 DIAGNOSIS — E7800 Pure hypercholesterolemia, unspecified: Secondary | ICD-10-CM | POA: Diagnosis not present

## 2022-08-06 DIAGNOSIS — I2510 Atherosclerotic heart disease of native coronary artery without angina pectoris: Secondary | ICD-10-CM | POA: Diagnosis not present

## 2022-08-06 DIAGNOSIS — F5101 Primary insomnia: Secondary | ICD-10-CM | POA: Diagnosis not present

## 2022-08-11 ENCOUNTER — Ambulatory Visit: Payer: Medicare Other | Admitting: Physician Assistant

## 2022-09-02 ENCOUNTER — Other Ambulatory Visit: Payer: Self-pay | Admitting: Cardiology

## 2022-09-05 ENCOUNTER — Telehealth: Payer: Self-pay

## 2022-09-05 NOTE — Patient Instructions (Signed)
Visit Information  Thank you for taking time to visit with me today. Please don't hesitate to contact me if I can be of assistance to you.   Following are the goals we discussed today:   Goals Addressed             This Visit's Progress    COMPLETED: Care Coordination Activities- No follow up required       Care Coordination Interventions: Advised patient to Annual wellness visit-patient scheduled already for 09/23/22            If you are experiencing a Mental Health or [place] or need someone to talk to, please call the Suicide and Crisis Lifeline: 988   Patient verbalizes understanding of instructions and care plan provided today and agrees to view in [place]. Active MyChart status and patient understanding of how to access instructions and care plan via MyChart confirmed with patient.     No further follow up required:    Jone Baseman, RN, MSN [facility] Care Management Coordinator Direct Line [phone] Toll Free: [phone]  Fax: [phone]

## 2022-09-05 NOTE — Patient Outreach (Signed)
  Care Coordination   Initial Visit Note   09/05/2022 Name: MUHSIN DORIS MRN: [id number] DOB: Dec 02, 1961  [name] is a 61 y.o. year old male who sees Shirline Frees, MD for primary care. I spoke with  [name] by phone today.  What matters to the patients health and wellness today?  none    Goals Addressed             This Visit's Progress    COMPLETED: Care Coordination Activities- No follow up required       Care Coordination Interventions: Advised patient to Annual wellness visit-patient scheduled already for 09/23/22          SDOH assessments and interventions completed:  Yes     Care Coordination Interventions Activated:  Yes  Care Coordination Interventions:  Yes, provided   Follow up plan: No further intervention required.   Encounter Outcome:  Pt. Visit Completed   Jone Baseman, RN, MSN [facility] Care Management Coordinator Direct Line [phone] Toll Free: [phone]  Fax: [phone]

## 2022-10-23 DIAGNOSIS — R310 Gross hematuria: Secondary | ICD-10-CM | POA: Diagnosis not present

## 2022-11-03 DIAGNOSIS — R310 Gross hematuria: Secondary | ICD-10-CM | POA: Diagnosis not present

## 2023-01-01 DIAGNOSIS — R310 Gross hematuria: Secondary | ICD-10-CM | POA: Diagnosis not present

## 2023-01-27 DIAGNOSIS — R310 Gross hematuria: Secondary | ICD-10-CM | POA: Diagnosis not present

## 2023-03-08 DIAGNOSIS — I10 Essential (primary) hypertension: Secondary | ICD-10-CM | POA: Diagnosis not present

## 2023-03-25 DIAGNOSIS — I10 Essential (primary) hypertension: Secondary | ICD-10-CM | POA: Diagnosis not present

## 2023-03-25 DIAGNOSIS — F17210 Nicotine dependence, cigarettes, uncomplicated: Secondary | ICD-10-CM | POA: Diagnosis not present

## 2023-03-25 DIAGNOSIS — I2510 Atherosclerotic heart disease of native coronary artery without angina pectoris: Secondary | ICD-10-CM | POA: Diagnosis not present

## 2023-03-25 DIAGNOSIS — M109 Gout, unspecified: Secondary | ICD-10-CM | POA: Diagnosis not present

## 2023-03-25 DIAGNOSIS — Z23 Encounter for immunization: Secondary | ICD-10-CM | POA: Diagnosis not present

## 2023-03-25 DIAGNOSIS — E1169 Type 2 diabetes mellitus with other specified complication: Secondary | ICD-10-CM | POA: Diagnosis not present

## 2023-03-25 DIAGNOSIS — E7800 Pure hypercholesterolemia, unspecified: Secondary | ICD-10-CM | POA: Diagnosis not present

## 2023-03-25 DIAGNOSIS — G8929 Other chronic pain: Secondary | ICD-10-CM | POA: Diagnosis not present

## 2023-03-25 DIAGNOSIS — F5101 Primary insomnia: Secondary | ICD-10-CM | POA: Diagnosis not present

## 2023-03-25 DIAGNOSIS — K219 Gastro-esophageal reflux disease without esophagitis: Secondary | ICD-10-CM | POA: Diagnosis not present

## 2023-03-25 DIAGNOSIS — E119 Type 2 diabetes mellitus without complications: Secondary | ICD-10-CM | POA: Diagnosis not present

## 2023-03-25 DIAGNOSIS — J449 Chronic obstructive pulmonary disease, unspecified: Secondary | ICD-10-CM | POA: Diagnosis not present

## 2023-04-01 ENCOUNTER — Other Ambulatory Visit: Payer: Self-pay | Admitting: Urology

## 2023-04-01 DIAGNOSIS — C61 Malignant neoplasm of prostate: Secondary | ICD-10-CM

## 2023-04-01 DIAGNOSIS — N402 Nodular prostate without lower urinary tract symptoms: Secondary | ICD-10-CM

## 2023-04-30 ENCOUNTER — Ambulatory Visit
Admission: RE | Admit: 2023-04-30 | Discharge: 2023-04-30 | Disposition: A | Discharge status: Home / Self Care | Payer: 59 | Source: Ambulatory Visit | Attending: Urology | Admitting: Urology

## 2023-04-30 DIAGNOSIS — N402 Nodular prostate without lower urinary tract symptoms: Secondary | ICD-10-CM

## 2023-04-30 DIAGNOSIS — C61 Malignant neoplasm of prostate: Secondary | ICD-10-CM

## 2023-04-30 MED ORDER — GADOPICLENOL 0.5 MMOL/ML IV SOLN
10.0000 mL | Freq: Once | INTRAVENOUS | Status: AC | PRN
  Administered 2023-04-30: 10 mL via INTRAVENOUS

## 2023-05-06 ENCOUNTER — Telehealth: Payer: Self-pay | Admitting: Cardiology

## 2023-05-06 NOTE — Telephone Encounter (Signed)
   Name: [name] [name]  DOB: 1961/10/26  MRN: [id number]  Primary Cardiologist: Nona Dell, MD   Preoperative team, please contact this patient and set up a phone call appointment for further preoperative risk assessment. Please obtain consent and complete medication review. Thank you for your help.  I confirm that guidance regarding antiplatelet and oral anticoagulation therapy has been completed and, if necessary, noted below.  Ideally aspirin should be continued without interruption, however if the bleeding risk is too great, aspirin may be held for 5-7 days prior to surgery. Please resume aspirin post operatively when it is felt to be safe from a bleeding standpoint.     [name], NP 05/06/2023, 4:54 PM [facility]

## 2023-05-06 NOTE — Telephone Encounter (Signed)
   Pre-operative Risk Assessment    Patient Name: [name] [name]  DOB: 10/24/1961 MRN: [id number]      Request for Surgical Clearance    Procedure:   MRI Fusion Bx  Date of Surgery:  Clearance 06/05/23                                 Surgeon:  Langston Masker Surgeon's Group or Practice Name:  [facility] Phone number:  [phone] Fax number:  [phone]   Type of Clearance Requested:   - Medical  - Pharmacy:  Hold Aspirin Please advise regarding discontinuation of Asprin or any blood thinners prior to surgery. We request an average of 5 days discontinuation for Aspirin prior to surgery.   Type of Anesthesia:  Not Indicated   Additional requests/questions:    Signed, Seymour Bars   05/06/2023, 3:57 PM

## 2023-05-07 NOTE — Telephone Encounter (Signed)
Pt has been scheduled for tele pre op appt 05/22/23 @ 9:20. Med rec and consent are done.     Patient Consent for Virtual Visit        USBALDO ISHAQ has provided verbal consent on 05/07/2023 for a virtual visit (video or telephone).   CONSENT FOR VIRTUAL VISIT FOR:  Marcheta Grammes  By participating in this virtual visit I agree to the following:  I hereby voluntarily request, consent and authorize [facility] and its employed or contracted physicians, physician assistants, nurse practitioners or other licensed health care professionals (the Practitioner), to provide me with telemedicine health care services (the "Services") as deemed necessary by the treating Practitioner. I acknowledge and consent to receive the Services by the Practitioner via telemedicine. I understand that the telemedicine visit will involve communicating with the Practitioner through live audiovisual communication technology and the disclosure of certain medical information by electronic transmission. I acknowledge that I have been given the opportunity to request an in-person assessment or other available alternative prior to the telemedicine visit and am voluntarily participating in the telemedicine visit.  I understand that I have the right to withhold or withdraw my consent to the use of telemedicine in the course of my care at any time, without affecting my right to future care or treatment, and that the Practitioner or I may terminate the telemedicine visit at any time. I understand that I have the right to inspect all information obtained and/or recorded in the course of the telemedicine visit and may receive copies of available information for a reasonable fee.  I understand that some of the potential risks of receiving the Services via telemedicine include:  Delay or interruption in medical evaluation due to technological equipment failure or disruption; Information transmitted may not be sufficient  (e.g. poor resolution of images) to allow for appropriate medical decision making by the Practitioner; and/or  In rare instances, security protocols could fail, causing a breach of personal health information.  Furthermore, I acknowledge that it is my responsibility to provide information about my medical history, conditions and care that is complete and accurate to the best of my ability. I acknowledge that Practitioner's advice, recommendations, and/or decision may be based on factors not within their control, such as incomplete or inaccurate data provided by me or distortions of diagnostic images or specimens that may result from electronic transmissions. I understand that the practice of medicine is not an exact science and that Practitioner makes no warranties or guarantees regarding treatment outcomes. I acknowledge that a copy of this consent can be made available to me via my patient portal [facility] MyChart), or I can request a printed copy by calling the office of [facility].    I understand that my insurance will be billed for this visit.   I have read or had this consent read to me. I understand the contents of this consent, which adequately explains the benefits and risks of the Services being provided via telemedicine.  I have been provided ample opportunity to ask questions regarding this consent and the Services and have had my questions answered to my satisfaction. I give my informed consent for the services to be provided through the use of telemedicine in my medical care

## 2023-05-07 NOTE — Telephone Encounter (Signed)
Pt has been scheduled for tele pre op appt 05/22/23 @ 9:20. Med rec and consent are done.

## 2023-05-21 NOTE — Progress Notes (Signed)
Virtual Visit via Telephone Note   Because of [name] [name]'s co-morbid illnesses, he is at least at moderate risk for complications without adequate follow up.  This format is felt to be most appropriate for this patient at this time.  The patient did not have access to video technology/had technical difficulties with video requiring transitioning to audio format only (telephone).  All issues noted in this document were discussed and addressed.  No physical exam could be performed with this format.  Please refer to the patient's chart for his consent to telehealth for [facility].  Evaluation Performed:  Preoperative cardiovascular risk assessment _____________   Date:  05/21/2023   Patient ID:  [name] [name], DOB November 16, 1961, MRN [id number] Patient Location:  Home Provider location:   Office  Primary Care Provider:  Johny Blamer, MD Primary Cardiologist:  Nona Dell, MD  Chief Complaint / Patient Profile   62 y.o. 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  who is pending MRI fusion biopsy and presents today for telephonic preoperative cardiovascular risk assessment.  History of Present Illness    [name] [name] is a 62 y.o. male who presents via audio/video conferencing for a telehealth visit today.  Pt was last seen in cardiology clinic on 07/21/2022 by Herma Carson, PA.  At that time [name] [name] was doing well but was smoking and dealing with the loss of his wife.  He reported no anginal complaints at that time. The patient is now pending procedure as outlined above. Since his last visit, he has been doing well with no new cardiac complaints since his previous visit.  He is staying active the best he can due to low back fusion and chronic pain.  He checks his blood pressures  at least once a week and states that the readings are in the 120s to 130s systolically.  He denies chest pain, shortness of breath, lower extremity edema, fatigue, palpitations, melena, hematuria, hemoptysis, diaphoresis, weakness, presyncope, syncope, orthopnea, and PND.    Past Medical History    Past Medical History:  Diagnosis Date   Arthritis    Barrett's esophagus 05/22/2015   Chronic back pain    COPD (chronic obstructive pulmonary disease) (HCC)    Coronary artery disease    a. cath in 2010 showing 20% distal LM, 70-80% Ost LAD, 30% OM, and 40% RCA b. low-risk NST in 2014 and 2016 c. 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 Underwent repair of dissection and SVG-RCA.    Essential hypertension    GERD (gastroesophageal reflux disease)    Gout    Hemorrhoids    History of alcohol abuse    Remote   History of bronchitis 2015   History of kidney stones    Hyperlipidemia    Lymphocytic colitis 2009   NASH (nonalcoholic steatohepatitis)    Peripheral vascular disease (HCC)    Personal history of colonic polyps 07/20/2002   Diminutive adenomas (3) 06/2002 diminutive adenoma (1) 2011    Pre-diabetes    Stroke [facility])    TIA (transient ischemic attack)    Urinary urgency    Past Surgical History:  Procedure Laterality Date   25 GAUGE PARS PLANA VITRECTOMY WITH 20 GAUGE MVR PORT  N/A 03/25/2019   Procedure: WOUND VAC CHANGE AND IRRIGATION;  Surgeon: Kerin Perna, MD;  Location: [facility] OR;  Service: Thoracic;  Laterality: N/A;   ANEURYSM COILING  12/2010   cerebral   APPLICATION OF WOUND VAC N/A 03/22/2019   Procedure: APPLICATION OF WOUND VAC;  Surgeon: Kerin Perna, MD;  Location[facility] OR;  Service: Thoracic;  Laterality: N/A;   APPLICATION OF WOUND VAC N/A 03/29/2019   Procedure: WOUND VAC CHANGE;  Surgeon: Kerin Perna, MD;  Location: [facility] OR;  Service: Thoracic;  Laterality:  N/A;   APPLICATION OF WOUND VAC N/A 03/31/2019   Procedure: WOUND VAC CHANGE;  Surgeon: Kerin Perna, MD;  Location: [facility] OR;  Service: Thoracic;  Laterality: N/A;   BACK SURGERY     BAND HEMORRHOIDECTOMY  2003   at sigmoidoscopy   CARDIAC CATHETERIZATION  2010   CARPAL TUNNEL RELEASE Right 11/14/2019   Procedure: Right carpal tunnel release, Right cubital tunnel release;  Surgeon: Latham Kinzler Mallick, MD;  Location: [facility];  Service: Orthopedics;  Laterality: Right;  block in preop   CHOLECYSTECTOMY  08/03/2015   CHOLECYSTECTOMY N/A 08/03/2015   Procedure: LAPAROSCOPIC CHOLECYSTECTOMY WITH INTRAOPERATIVE CHOLANGIOGRAM;  Surgeon: Claud Kelp, MD;  Location: [facility] OR;  Service: General;  Laterality: N/A;   COLONOSCOPY  multiple   CORONARY ARTERY BYPASS GRAFT N/A 02/23/2019   Procedure: CORONARY ARTERY BYPASS GRAFTING (CABG) x 1; Using Endoscopically Harvested Right Leg Greater Saphenous Vein Graft (SVG); SVG to RCA;  Surgeon: Kerin Perna, MD;  Location[facility] OR;  Service: Open Heart Surgery;  Laterality: N/A;   CORONARY STENT INTERVENTION N/A 02/23/2019   Procedure: CORONARY STENT INTERVENTION;  Surgeon: Swaziland, Peter M, MD;  Location: [facility] INVASIVE CV LAB;  Service: Cardiovascular;  Laterality: N/A;   ESOPHAGOGASTRODUODENOSCOPY     KNEE ARTHROSCOPY WITH MEDIAL MENISECTOMY Left 05/20/2017   Procedure: LEFT KNEE ARTHROSCOPY WITH PARTIAL MEDIAL MENISCECTOMY;  Surgeon: Tarry Kos, MD;  Location: [facility];  Service: Orthopedics;  Laterality: Left;   LEFT HEART CATH AND CORONARY ANGIOGRAPHY N/A 02/23/2019   Procedure: LEFT HEART CATH AND CORONARY ANGIOGRAPHY;  Surgeon: Swaziland, Peter M, MD;  Location: [facility] INVASIVE CV LAB;  Service: Cardiovascular;  Laterality: N/A;   LUMBAR SPINE SURGERY     x 2   NASAL SINUS SURGERY     PECTORALIS FLAP N/A 04/04/2019   Procedure: right pectoralis muscle flap for sternal wound reconstruction and VAC placement;  Surgeon:  Peggye Form, DO;  Location: [facility] OR;  Service: Plastics;  Laterality: N/A;  2.5 hours case length   REPAIR OF ACUTE ASCENDING THORACIC AORTIC DISSECTION N/A 02/23/2019   Procedure: REPAIR OF TYPE A  - ACUTE ASCENDING THORACIC AORTIC DISSECTION, using Hemashield Platinum Woven Double Velour Vascular Graft (D: 26mm, L: 30cm);  Surgeon: Donata Clay, Theron Arista, MD;  Location: [facility] OR;  Service: Vascular;  Laterality: N/A;   STERNAL WOUND DEBRIDEMENT N/A 03/22/2019   Procedure: STERNAL WOUND DEBRIDEMENT;  Surgeon: Kerin Perna, MD;  Location: [facility] OR;  Service: Thoracic;  Laterality: N/A;   STERNAL WOUND DEBRIDEMENT N/A 03/25/2019   Procedure: SUPERFICIAL STERNAL WOUND DEBRIDEMENT;  Surgeon: Kerin Perna, MD;  Location: [facility] OR;  Service: Thoracic;  Laterality: N/A;   STERNAL WOUND DEBRIDEMENT N/A 03/29/2019   Procedure: SUPERFICIAL STERNAL WOUND DEBRIDEMENT;  Surgeon: Kerin Perna, MD;  Location: [facility] OR;  Service: Thoracic;  Laterality: N/A;   STERNAL WOUND DEBRIDEMENT N/A 03/31/2019   Procedure: STERNAL  WOUND DEBRIDEMENT USING 1G ACEL POWDER;  Surgeon: Donata Clay, Theron Arista, MD;  Location: [facility] OR;  Service: Thoracic;  Laterality: N/A;   STERNAL WOUND DEBRIDEMENT N/A 04/04/2019   Procedure: STERNAL WOUND DEBRIDEMENT;  Surgeon: Kerin Perna, MD;  Location: [facility] OR;  Service: Thoracic;  Laterality: N/A;   TEE WITHOUT CARDIOVERSION  05/28/2012   Procedure: TRANSESOPHAGEAL ECHOCARDIOGRAM (TEE);  Surgeon: Lewayne Bunting, MD;  Location:  [facility] ENDOSCOPY;  Service: Cardiovascular;  Laterality: N/A;   TEE WITHOUT CARDIOVERSION N/A 02/23/2019   Procedure: TRANSESOPHAGEAL ECHOCARDIOGRAM (TEE);  Surgeon: Donata Clay, Theron Arista, MD;  Location: [facility] OR;  Service: Open Heart Surgery;  Laterality: N/A;    Allergies  Allergies  Allergen Reactions   Penicillins Hives, Rash and Other (See Comments)    Did it involve swelling of the face/tongue/throat, SOB, or low BP? Yes Did it involve sudden or severe rash/hives, skin peeling, or  any reaction on the inside of your mouth or nose? Yes  Did you need to seek medical attention at a hospital or doctor's office? Yes When did it last happen? As a child.      If all above answers are "NO", may proceed with cephalosporin use.     Home Medications    Prior to Admission medications   Medication Sig Start Date End Date Taking? Authorizing Provider  albuterol (PROVENTIL HFA;VENTOLIN HFA) 108 (90 Base) MCG/ACT inhaler Inhale 1 puff into the lungs every 6 (six) hours as needed for wheezing or shortness of breath.     [provider]  allopurinol (ZYLOPRIM) 100 MG tablet Take 100 mg by mouth daily.     [provider]  amLODipine (NORVASC) 10 MG tablet Take 10 mg by mouth daily.    [provider]  aspirin EC 81 MG EC tablet Take 1 tablet (81 mg total) by mouth daily. 04/07/19   Leary Roca, PA-C  atorvastatin (LIPITOR) 80 MG tablet Take 1 tablet (80 mg total) by mouth daily at 6 PM. 03/05/19   Roddenberry, Cecille Amsterdam, PA-C  esomeprazole (NEXIUM) 40 MG capsule Take 40 mg by mouth 2 (two) times daily.    [provider]  Fluticasone-Umeclidin-Vilant (TRELEGY ELLIPTA) 200-62.5-25 MCG/INH AEPB Inhale into the lungs 2 (two) times daily.    [provider]  HYDROcodone-acetaminophen (NORCO/VICODIN) 5-325 MG tablet Take 1-2 tablets by mouth every 6 (six) hours as needed. 11/14/19   Cain Saupe III, MD  ipratropium-albuterol (DUONEB) 0.5-2.5 (3) MG/3ML SOLN Take 3 mLs by nebulization every 8 (eight) hours as needed (wheezing, shortness of breath). 11/29/18   Cleora Fleet, MD  isosorbide mononitrate (IMDUR) 30 MG 24 hr tablet Take 1 tablet by mouth once daily 09/02/22   Jonelle Sidle, MD  losartan (COZAAR) 100 MG tablet Take 1 tablet (100 mg total) by mouth daily. 03/06/19   Leary Roca, PA-C  metoprolol tartrate (LOPRESSOR) 50 MG tablet Take 1 tablet (50 mg total) by mouth 2 (two) times daily. 03/07/22   Jonelle Sidle,  MD  milk thistle 175 MG tablet Take 175 mg by mouth daily.    [provider]  Multiple Vitamin (MULITIVITAMIN WITH MINERALS) TABS Take 1 tablet by mouth daily.    [provider]  naproxen (NAPROSYN) 500 MG tablet Take 500 mg by mouth 2 (two) times daily. 06/29/19   [provider]  nitroGLYCERIN (NITROSTAT) 0.4 MG SL tablet Place 1 tablet (0.4 mg total) under the tongue every 5 (five) minutes as needed. For chest pain  11/19/20   Jonelle Sidle, MD  Omega-3 Fatty Acids (FISH OIL) 1200 MG CPDR Take 1,200 mg by mouth daily.    [provider]  spironolactone (ALDACTONE) 25 MG tablet Take 0.5 tablets (12.5 mg total) by mouth daily. 04/07/19   Leary Roca, PA-C  traZODone (DESYREL) 100 MG tablet Take 100 mg by mouth at bedtime.     [provider]    Physical Exam    Vital Signs:  [name] [name] does not have vital signs available for review today.  Given telephonic nature of communication, physical exam is limited. AAOx3. NAD. Normal affect.  Speech and respirations are unlabored.  Accessory Clinical Findings    None  Assessment & Plan    1.  Preoperative Cardiovascular Risk Assessment:  Patient's RCRI score is 0.9%  The patient affirms he has been doing well without any new cardiac symptoms. They are able to achieve 5 METS without cardiac limitations. Therefore, based on ACC/AHA guidelines, the patient would be at acceptable risk for the planned procedure without further cardiovascular testing. The patient was advised that if he develops new symptoms prior to surgery to contact our office to arrange for a follow-up visit, and he verbalized understanding.   The patient was advised that if he develops new symptoms prior to surgery to contact our office to arrange for a follow-up visit, and he verbalized understanding.  Patient can hold ASA 81 mg for 5 to 7 days prior to procedure and should restart postprocedure when safest and  directed by performing provider.  A copy of this note will be routed to requesting surgeon.  Time:   Today, I have spent 8 minutes with the patient with telehealth technology discussing medical history, symptoms, and management plan.     Napoleon Form, Leodis Rains, NP  05/21/2023, 8:03 AM

## 2023-05-22 ENCOUNTER — Ambulatory Visit: Payer: Medicare Other | Attending: Cardiovascular Disease

## 2023-05-22 DIAGNOSIS — Z01810 Encounter for preprocedural cardiovascular examination: Secondary | ICD-10-CM

## 2023-05-26 DIAGNOSIS — I2510 Atherosclerotic heart disease of native coronary artery without angina pectoris: Secondary | ICD-10-CM | POA: Diagnosis not present

## 2023-05-26 DIAGNOSIS — E1169 Type 2 diabetes mellitus with other specified complication: Secondary | ICD-10-CM | POA: Diagnosis not present

## 2023-05-26 DIAGNOSIS — E7800 Pure hypercholesterolemia, unspecified: Secondary | ICD-10-CM | POA: Diagnosis not present

## 2023-05-26 DIAGNOSIS — J449 Chronic obstructive pulmonary disease, unspecified: Secondary | ICD-10-CM | POA: Diagnosis not present

## 2023-05-26 DIAGNOSIS — E785 Hyperlipidemia, unspecified: Secondary | ICD-10-CM | POA: Diagnosis not present

## 2023-05-26 DIAGNOSIS — K219 Gastro-esophageal reflux disease without esophagitis: Secondary | ICD-10-CM | POA: Diagnosis not present

## 2023-05-26 DIAGNOSIS — I10 Essential (primary) hypertension: Secondary | ICD-10-CM | POA: Diagnosis not present

## 2023-05-27 ENCOUNTER — Other Ambulatory Visit: Payer: Self-pay | Admitting: Cardiology

## 2023-06-02 ENCOUNTER — Other Ambulatory Visit: Payer: Self-pay | Admitting: Cardiology

## 2023-06-16 DIAGNOSIS — J449 Chronic obstructive pulmonary disease, unspecified: Secondary | ICD-10-CM | POA: Diagnosis not present

## 2023-06-16 DIAGNOSIS — E7800 Pure hypercholesterolemia, unspecified: Secondary | ICD-10-CM | POA: Diagnosis not present

## 2023-06-16 DIAGNOSIS — E1169 Type 2 diabetes mellitus with other specified complication: Secondary | ICD-10-CM | POA: Diagnosis not present

## 2023-06-16 DIAGNOSIS — I10 Essential (primary) hypertension: Secondary | ICD-10-CM | POA: Diagnosis not present

## 2023-06-16 DIAGNOSIS — K219 Gastro-esophageal reflux disease without esophagitis: Secondary | ICD-10-CM | POA: Diagnosis not present

## 2023-06-17 DIAGNOSIS — F17210 Nicotine dependence, cigarettes, uncomplicated: Secondary | ICD-10-CM | POA: Diagnosis not present

## 2023-06-21 ENCOUNTER — Other Ambulatory Visit: Payer: Self-pay | Admitting: Cardiology

## 2023-07-06 ENCOUNTER — Ambulatory Visit
Admission: RE | Admit: 2023-07-06 | Discharge: 2023-07-06 | Disposition: A | Discharge status: Home / Self Care | Payer: 59 | Source: Ambulatory Visit | Attending: Radiation Oncology | Admitting: Radiation Oncology

## 2023-07-06 ENCOUNTER — Ambulatory Visit: Payer: 59

## 2023-07-06 ENCOUNTER — Telehealth: Payer: Self-pay | Admitting: Radiation Oncology

## 2023-07-06 NOTE — Telephone Encounter (Signed)
Rescheduled today's missed appointment due to patient believing appointment was on 7/18 instead. Patient is aware of changes. Mailed reminder.

## 2023-07-15 DIAGNOSIS — J449 Chronic obstructive pulmonary disease, unspecified: Secondary | ICD-10-CM | POA: Diagnosis not present

## 2023-07-15 DIAGNOSIS — I10 Essential (primary) hypertension: Secondary | ICD-10-CM | POA: Diagnosis not present

## 2023-07-15 DIAGNOSIS — K219 Gastro-esophageal reflux disease without esophagitis: Secondary | ICD-10-CM | POA: Diagnosis not present

## 2023-07-15 DIAGNOSIS — E1169 Type 2 diabetes mellitus with other specified complication: Secondary | ICD-10-CM | POA: Diagnosis not present

## 2023-07-15 DIAGNOSIS — E785 Hyperlipidemia, unspecified: Secondary | ICD-10-CM | POA: Diagnosis not present

## 2023-07-22 NOTE — Progress Notes (Signed)
GU Location of Tumor / Histology: Prostate Ca  If Prostate Cancer, Gleason Score is (3 + 4) and PSA is (0.25 on 04/14/2023)  Biopsies      04/30/2023 Dr. 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 C61. PSA level of 0.27 on 10/23/2022.  IMPRESSION: 1. PI-RADS category 4 lesion of the right posterolateral peripheral zone at the base and mid gland. Targeting data sent to [facility]. 2. Dense calcification along the posterior margin the transition zone bilaterally. 3. Postoperative findings at the lumbosacral junction. 4. 1.2 cm lesion in the right proximal femoral neck has been present at least since 2016 although has demonstrated increasing sclerosis in recent years; this is likely a benign lesion.  Past/Anticipated interventions by urology, if any:  1 month -Follow up with MD  Past/Anticipated interventions by medical oncology, if any: NA  Weight changes, if any:  Weight loss 40-50 lbs due loss of the wife.  IPSS:  23 SHIM:  9  Bowel/Bladder complaints, if any:  No  Nausea/Vomiting, if any: No  Pain issues, if any:  0/10  SAFETY ISSUES: Prior radiation? No Pacemaker/ICD? No Possible current pregnancy? Male Is the patient on methotrexate? No  Current Complaints / other details:

## 2023-07-26 NOTE — Progress Notes (Signed)
Radiation Oncology         [phone] ________________________________  Initial Outpatient Consultation  Name: [name] [name] MRN: [id number]  Date: 07/27/2023  DOB: 10-Nov-1961  JX:[id number], Tawni Pummel, MD  Noberto Retort, MD   REFERRING PHYSICIAN: Noberto Retort, MD  DIAGNOSIS: 62 y.o. 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    ICD-10-CM   1. Malignant neoplasm of prostate (HCC)  C61       HISTORY OF PRESENT ILLNESS: [name] [name] is a 62 y.o. male with a diagnosis of prostate cancer. He was referred for evaluation in urology by Dr. Alvester Morin on 10/23/22 for a history of gross hematuria, digital rectal examination performed at that time showed diffuse nodularity toward base of prostate. A baseline PSA obtained that day was WNL at 0.27. He underwent CT A/P on 11/03/22 showing: no obstructing renal stones, hydronephrosis, ureteral or bladder stones, suspicious renal cortical masses, or urothelial lesions; nonspecific internal prostatic calcifications; no pathologically enlarged lymph nodes; no aggressive-appearing focal lytic lesions.  In light of his nodular prostate on DRE, the patient proceeded to transrectal ultrasound with 12 biopsies of the prostate on 12/23/22.  The prostate volume measured 35.2 cc.  Out of 12 core biopsies, 3 were positive.  The maximum Gleason score was 3+3, and this was seen in right base, right mid (small focus), and right base lateral (small focus). A Decipher genomic test was obtained on the biopsy sample and showed intermediate risk (0.55 on a scale of 0-1). He was placed on close surveillance.  A recent repeat PSA on 04/14/23 showed stability at 0.25. He underwent surveillance prostate MRI on 04/30/23 showing: PI-RADS 4 lesion of right posterolateral peripheral zone at base and mid gland; dense calcification along posterior margin of transition zone bilaterally; 1.2 cm lesion in right proximal  femoral neck, present since at least 2016 and with increasing sclerosis in recent years. This prompted an MRI fusion biopsy of the prostate on 06/05/23. Out of 16 core biopsies, 6 were positive. The maximum Gleason score was 3+4. The same three cores were again positive, two with upgraded disease, along with three cores from the ROI (with perineural invasion).  The patient reviewed the biopsy results with his urologist and he has kindly been referred today for discussion of potential radiation treatment options.   PREVIOUS RADIATION THERAPY: No  PAST MEDICAL HISTORY:  Past Medical History:  Diagnosis Date   Arthritis    Barrett's esophagus 05/22/2015   Chronic back pain    COPD (chronic obstructive pulmonary disease) (HCC)    Coronary artery disease    a. cath in 2010 showing 20% distal LM, 70-80% Ost LAD, 30% OM, and 40% RCA b. low-risk NST in 2014 and 2016 c. 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 Underwent repair of dissection and SVG-RCA.    Essential hypertension    GERD (gastroesophageal reflux disease)    Gout    Hemorrhoids    History of alcohol abuse    Remote   History of bronchitis 2015   History of kidney stones    Hyperlipidemia    Lymphocytic colitis 2009   NASH (nonalcoholic steatohepatitis)    Peripheral vascular disease (HCC)    Personal history of colonic polyps 07/20/2002   Diminutive adenomas (3) 06/2002 diminutive adenoma (1) 2011    Pre-diabetes    Stroke (  HCC)    TIA (transient ischemic attack)    Urinary urgency       PAST SURGICAL HISTORY: Past Surgical History:  Procedure Laterality Date   25 GAUGE PARS PLANA VITRECTOMY WITH 20 GAUGE MVR PORT N/A 03/25/2019   Procedure: WOUND VAC CHANGE AND IRRIGATION;  Surgeon: Kerin Perna, MD;  Location: [facility] OR;  Service: Thoracic;  Laterality: N/A;   ANEURYSM COILING  12/2010   cerebral   APPLICATION OF WOUND  VAC N/A 03/22/2019   Procedure: APPLICATION OF WOUND VAC;  Surgeon: Kerin Perna, MD;  Location: [facility] OR;  Service: Thoracic;  Laterality: N/A;   APPLICATION OF WOUND VAC N/A 03/29/2019   Procedure: WOUND VAC CHANGE;  Surgeon: Kerin Perna, MD;  Location: [facility] OR;  Service: Thoracic;  Laterality: N/A;   APPLICATION OF WOUND VAC N/A 03/31/2019   Procedure: WOUND VAC CHANGE;  Surgeon: Kerin Perna, MD;  Location: [facility] OR;  Service: Thoracic;  Laterality: N/A;   BACK SURGERY     BAND HEMORRHOIDECTOMY  2003   at sigmoidoscopy   CARDIAC CATHETERIZATION  2010   CARPAL TUNNEL RELEASE Right 11/14/2019   Procedure: Right carpal tunnel release, Right cubital tunnel release;  Surgeon: Ernest Mallick, MD;  Location: [facility];  Service: Orthopedics;  Laterality: Right;  block in preop   CHOLECYSTECTOMY  08/03/2015   CHOLECYSTECTOMY N/A 08/03/2015   Procedure: LAPAROSCOPIC CHOLECYSTECTOMY WITH INTRAOPERATIVE CHOLANGIOGRAM;  Surgeon: Claud Kelp, MD;  Location: [facility] OR;  Service: General;  Laterality: N/A;   COLONOSCOPY  multiple   CORONARY ARTERY BYPASS GRAFT N/A 02/23/2019   Procedure: CORONARY ARTERY BYPASS GRAFTING (CABG) x 1; Using Endoscopically Harvested Right Leg Greater Saphenous Vein Graft (SVG); SVG to RCA;  Surgeon: Kerin Perna, MD;  Location: [facility] OR;  Service: Open Heart Surgery;  Laterality: N/A;   CORONARY STENT INTERVENTION N/A 02/23/2019   Procedure: CORONARY STENT INTERVENTION;  Surgeon: Swaziland, Peter M, MD;  Location: [facility] INVASIVE CV LAB;  Service: Cardiovascular;  Laterality: N/A;   ESOPHAGOGASTRODUODENOSCOPY     KNEE ARTHROSCOPY WITH MEDIAL MENISECTOMY Left 05/20/2017   Procedure: LEFT KNEE ARTHROSCOPY WITH PARTIAL MEDIAL MENISCECTOMY;  Surgeon: Tarry Kos, MD;  Location: [facility];  Service: Orthopedics;  Laterality: Left;   LEFT HEART CATH AND CORONARY ANGIOGRAPHY N/A 02/23/2019   Procedure: LEFT HEART CATH AND CORONARY  ANGIOGRAPHY;  Surgeon: Swaziland, Peter M, MD;  Location: [facility] INVASIVE CV LAB;  Service: Cardiovascular;  Laterality: N/A;   LUMBAR SPINE SURGERY     x 2   NASAL SINUS SURGERY     PECTORALIS FLAP N/A 04/04/2019   Procedure: right pectoralis muscle flap for sternal wound reconstruction and VAC placement;  Surgeon: Peggye Form, DO;  Location: [facility] OR;  Service: Plastics;  Laterality: N/A;  2.5 hours case length   PROSTATE BIOPSY     REPAIR OF ACUTE ASCENDING THORACIC AORTIC DISSECTION N/A 02/23/2019   Procedure: REPAIR OF TYPE A  - ACUTE ASCENDING THORACIC AORTIC DISSECTION, using Hemashield Platinum Woven Double Velour Vascular Graft (D: 26mm, L: 30cm);  Surgeon: Donata Clay, Theron Arista, MD;  Location: [facility] OR;  Service: Vascular;  Laterality: N/A;   STERNAL WOUND DEBRIDEMENT N/A 03/22/2019   Procedure: STERNAL WOUND DEBRIDEMENT;  Surgeon: Kerin Perna, MD;  Location: [facility] OR;  Service: Thoracic;  Laterality: N/A;   STERNAL WOUND DEBRIDEMENT N/A 03/25/2019   Procedure: SUPERFICIAL STERNAL WOUND DEBRIDEMENT;  Surgeon: Kerin Perna, MD;  Location: [facility] OR;  Service: Thoracic;  Laterality: N/A;   STERNAL WOUND DEBRIDEMENT N/A 03/29/2019   Procedure: SUPERFICIAL STERNAL WOUND DEBRIDEMENT;  Surgeon: Kerin Perna, MD;  Location: [facility] OR;  Service: Thoracic;  Laterality: N/A;   STERNAL WOUND DEBRIDEMENT N/A 03/31/2019   Procedure: STERNAL WOUND DEBRIDEMENT USING 1G ACEL POWDER;  Surgeon: Kerin Perna, MD;  Location: [facility] OR;  Service: Thoracic;  Laterality: N/A;   STERNAL WOUND DEBRIDEMENT N/A 04/04/2019   Procedure: STERNAL WOUND DEBRIDEMENT;  Surgeon: Kerin Perna, MD;  Location: [facility] OR;  Service: Thoracic;  Laterality: N/A;   TEE WITHOUT CARDIOVERSION  05/28/2012   Procedure: TRANSESOPHAGEAL ECHOCARDIOGRAM (TEE);  Surgeon: Lewayne Bunting, MD;  Location: [facility] ENDOSCOPY;  Service: Cardiovascular;  Laterality: N/A;   TEE WITHOUT CARDIOVERSION N/A 02/23/2019   Procedure: TRANSESOPHAGEAL  ECHOCARDIOGRAM (TEE);  Surgeon: Donata Clay, Theron Arista, MD;  Location: [facility] OR;  Service: Open Heart Surgery;  Laterality: N/A;    FAMILY HISTORY:  Family History  Problem Relation Age of Onset   Liver disease Mother    Liver cancer Mother    Breast cancer Mother    Cancer Mother    Hypertension Mother    Heart attack Mother    Cancer Father    Heart disease Father        before age 46   Hyperlipidemia Father    Hypertension Father    Heart attack Father    Heart disease Other        multiple family members on maternal and paternal side of family   Diabetes Sister    Hyperlipidemia Sister    Hypertension Sister    Diabetes Paternal Grandmother    Hypertension Brother    Colon cancer Neg Hx     SOCIAL HISTORY:  Social History   Socioeconomic History   Marital status: Widowed    Spouse name: Not on file   Number of children: 3   Years of education: Not on file   Highest education level: Not on file  Occupational History   Occupation: Disabled    Employer: UNEMPLOYED  Tobacco Use   Smoking status: Every Day    Current packs/day: 0.50    Average packs/day: 0.5 packs/day for 4.0 years (2.0 ttl pk-yrs)    Types: Cigarettes   Smokeless tobacco: Never  Vaping Use   Vaping status: Never Used  Substance and Sexual Activity   Alcohol use: No    Alcohol/week: 0.0 standard drinks of alcohol    Comment: no alcohol in 61yrs    Drug use: No   Sexual activity: Not on file  Other Topics Concern   Not on file  Social History Narrative   Not on file   Social Determinants of Health   Financial Resource Strain: Not on file  Food Insecurity: No Food Insecurity (07/27/2023)   Hunger Vital Sign    Worried About Running Out of Food in the Last Year: Never true    Ran Out of Food in the Last Year: Never true  Transportation Needs: No Transportation Needs (07/27/2023)   PRAPARE - Administrator, Civil Service (Medical): No    Lack of Transportation (Non-Medical): No   Physical Activity: Not on file  Stress: Not on file  Social Connections: Not on file  Intimate Partner Violence: Not At Risk (07/27/2023)   Humiliation, Afraid, Rape, and Kick questionnaire    Fear of Current or Ex-Partner: No    Emotionally Abused: No    Physically Abused: No  Sexually Abused: No    ALLERGIES: Penicillins, Gabapentin, Isosorbide nitrate, and Pregabalin  MEDICATIONS:  Current Outpatient Medications  Medication Sig Dispense Refill   ACCU-CHEK GUIDE test strip FOR USE WHEN CHECKING BLOOD SUGARS FINGER STICK ONCE DAILY, ALTERNATING MORNINGS AND EVENINGS BEFORE MEALS     buPROPion (WELLBUTRIN XL) 300 MG 24 hr tablet 1 tablet in the morning Orally Once a day for 30 days     Lancets (ONETOUCH DELICA PLUS LANCET33G) MISC USE 1 TO CHECK GLUCOSE ONCE DAILY     tamsulosin (FLOMAX) 0.4 MG CAPS capsule Take 1 capsule (0.4 mg total) by mouth daily. Take 1 capsule (0.4 mg total) by mouth daily at night 30 capsule 5   TRELEGY ELLIPTA 100-62.5-25 MCG/ACT AEPB Take 1 puff by mouth daily.     albuterol (PROVENTIL HFA;VENTOLIN HFA) 108 (90 Base) MCG/ACT inhaler Inhale 1 puff into the lungs every 6 (six) hours as needed for wheezing or shortness of breath.      allopurinol (ZYLOPRIM) 100 MG tablet Take 100 mg by mouth daily.      amLODipine (NORVASC) 10 MG tablet Take 10 mg by mouth daily.     aspirin EC 81 MG EC tablet Take 1 tablet (81 mg total) by mouth daily.     atorvastatin (LIPITOR) 80 MG tablet Take 1 tablet (80 mg total) by mouth daily at 6 PM. 30 tablet 2   esomeprazole (NEXIUM) 40 MG capsule Take 40 mg by mouth 2 (two) times daily.     HYDROcodone-acetaminophen (NORCO/VICODIN) 5-325 MG tablet Take 1-2 tablets by mouth every 6 (six) hours as needed. 30 tablet 0   ipratropium-albuterol (DUONEB) 0.5-2.5 (3) MG/3ML SOLN Take 3 mLs by nebulization every 8 (eight) hours as needed (wheezing, shortness of breath). 360 mL 0   isosorbide mononitrate (IMDUR) 30 MG 24 hr tablet TAKE 1  TABLET BY MOUTH ONCE DAILY . APPOINTMENT REQUIRED FOR FUTURE REFILLS 30 tablet 2   losartan (COZAAR) 100 MG tablet Take 1 tablet (100 mg total) by mouth daily. 30 tablet 2   metoprolol tartrate (LOPRESSOR) 50 MG tablet Take 1 tablet by mouth twice daily 180 tablet 0   milk thistle 175 MG tablet Take 175 mg by mouth daily.     Multiple Vitamin (MULITIVITAMIN WITH MINERALS) TABS Take 1 tablet by mouth daily.     naproxen (NAPROSYN) 500 MG tablet Take 500 mg by mouth 2 (two) times daily.     nitroGLYCERIN (NITROSTAT) 0.4 MG SL tablet Place 1 tablet (0.4 mg total) under the tongue every 5 (five) minutes as needed. For chest pain 25 tablet 3   Omega-3 Fatty Acids (FISH OIL) 1200 MG CPDR Take 1,200 mg by mouth daily.     spironolactone (ALDACTONE) 25 MG tablet Take 0.5 tablets (12.5 mg total) by mouth daily. 30 tablet 2   traZODone (DESYREL) 100 MG tablet Take 100 mg by mouth at bedtime.      No current facility-administered medications for this encounter.    REVIEW OF SYSTEMS:  On review of systems, the patient reports that he is doing well overall. He denies any chest pain, shortness of breath, cough, fevers, chills, night sweats, unintended weight changes. He denies any bowel disturbances, and denies abdominal pain, nausea or vomiting. He denies any new musculoskeletal or joint aches or pains. His IPSS was 23, indicating urinary symptoms. His SHIM was 9, indicating he has moderate erectile dysfunction. A complete review of systems is obtained and is otherwise negative.    PHYSICAL EXAM:  Wt Readings from Last 3 Encounters:  07/27/23 198 lb 6.4 oz (90 kg)  07/21/22 219 lb (99.3 kg)  06/06/21 237 lb (107.5 kg)   Temp Readings from Last 3 Encounters:  07/27/23 (!) 96.8 F (36 C)  11/14/19 97.7 F (36.5 C)  08/22/19 97.7 F (36.5 C)   BP Readings from Last 3 Encounters:  07/27/23 127/70  07/21/22 134/80  06/06/21 (!) 150/76   Pulse Readings from Last 3 Encounters:  07/27/23 70   07/21/22 68  06/06/21 72   Pain Assessment Pain Score: 0-No pain/10  In general this is a well appearing male in no acute distress. He's alert and oriented x4 and appropriate throughout the examination. Cardiopulmonary assessment is negative for acute distress, and he exhibits normal effort.     KPS = 80  100 - Normal; no complaints; no evidence of disease. 90   - Able to carry on normal activity; minor signs or symptoms of disease. 80   - Normal activity with effort; some signs or symptoms of disease. 35   - Cares for self; unable to carry on normal activity or to do active work. 60   - Requires occasional assistance, but is able to care for most of his personal needs. 50   - Requires considerable assistance and frequent medical care. 40   - Disabled; requires special care and assistance. 30   - Severely disabled; hospital admission is indicated although death not imminent. 20   - Very sick; hospital admission necessary; active supportive treatment necessary. 10   - Moribund; fatal processes progressing rapidly. 0     - Dead  Karnofsky DA, Abelmann WH, Craver LS and [facility] [phone]) The use of the nitrogen mustards in the palliative treatment of carcinoma: with particular reference to bronchogenic carcinoma Cancer 1 634-56  LABORATORY DATA:  Lab Results  Component Value Date   WBC 10.0 04/07/2019   HGB 8.6 (L) 04/07/2019   HCT 27.7 (L) 04/07/2019   MCV 88.5 04/07/2019   PLT 381 04/07/2019   Lab Results  Component Value Date   NA 138 11/10/2019   K 3.9 11/10/2019   CL 106 11/10/2019   CO2 20 (L) 11/10/2019   Lab Results  Component Value Date   ALT 17 04/06/2019   AST 20 04/06/2019   ALKPHOS 97 04/06/2019   BILITOT 0.3 04/06/2019     RADIOGRAPHY: No results found.    IMPRESSION/PLAN: 1. 62 y.o. gentleman with Stage T2c adenocarcinoma of the prostate with Gleason Score of 3+4, and PSA of 0.25.  We discussed the patient's workup and outlined the nature of  prostate cancer in this setting. The patient's T stage, Gleason's score, and PSA put him into the favorable intermediate risk group. Accordingly, he is eligible for a variety of potential treatment options including brachytherapy, 5.5 weeks of external radiation, or prostatectomy. We discussed the available radiation techniques, and focused on the details and logistics of delivery. We discussed and outlined the risks, benefits, short and long-term effects associated with radiotherapy and compared and contrasted these with prostatectomy. We discussed the role of SpaceOAR gel in reducing the rectal toxicity associated with radiotherapy. He appears to have a good understanding of his disease and our treatment recommendations which are of curative intent.  He was encouraged to ask questions that were answered to his stated satisfaction.  At the conclusion of our conversation, the patient is interested in moving forward with external beam radiation. Patient has undergone cardiac surgery and has a  multitude of health conditions, so he is likely not a good surgery candidate. However, he is experiencing severe urinary symptoms and states he is getting up approximately 15 times per night. A prescription for Flomax was sent to his pharmacy today.  We will share our discussion with Dr. Alvester Morin and schedule him for fiducials/SpaceOar gel along with his CT simulation. We look forward to participating in his care.   We personally spent 60 minutes in this encounter including chart review, reviewing radiological studies, meeting face-to-face with the patient, entering orders and completing documentation.     Joyice Faster, PA-C    Margaretmary Dys, MD  [facility]  Radiation Oncology Direct Dial: [phone]  Fax: [phone] [email or web address]  Skype  [email or web address]   This document serves as a record of services personally performed by Margaretmary Dys, MD and Joyice Faster, PA-C. It was created on their behalf by Mickie Bail, a trained medical scribe. The creation of this record is based on the scribe's personal observations and the provider's statements to them. This document has been checked and approved by the attending provider.

## 2023-07-27 ENCOUNTER — Other Ambulatory Visit: Payer: Self-pay

## 2023-07-27 ENCOUNTER — Encounter: Payer: Self-pay | Admitting: Radiation Oncology

## 2023-07-27 ENCOUNTER — Ambulatory Visit
Admission: RE | Admit: 2023-07-27 | Discharge: 2023-07-27 | Disposition: A | Discharge status: Home / Self Care | Payer: 59 | Source: Ambulatory Visit | Attending: Radiation Oncology | Admitting: Radiation Oncology

## 2023-07-27 VITALS — BP 127/70 | HR 70 | Temp 96.8°F | Resp 18 | Ht 67.0 in | Wt 198.4 lb

## 2023-07-27 DIAGNOSIS — C61 Malignant neoplasm of prostate: Secondary | ICD-10-CM

## 2023-07-27 DIAGNOSIS — Z191 Hormone sensitive malignancy status: Secondary | ICD-10-CM | POA: Diagnosis not present

## 2023-07-27 MED ORDER — TAMSULOSIN HCL 0.4 MG PO CAPS: 0.4000 mg | ORAL_CAPSULE | Freq: Every day | ORAL | 5 refills | Status: DC

## 2023-07-28 DIAGNOSIS — Z191 Hormone sensitive malignancy status: Secondary | ICD-10-CM | POA: Diagnosis not present

## 2023-08-06 ENCOUNTER — Other Ambulatory Visit: Payer: Self-pay | Admitting: Urology

## 2023-08-10 ENCOUNTER — Other Ambulatory Visit: Payer: Self-pay | Admitting: Urology

## 2023-08-20 DIAGNOSIS — R300 Dysuria: Secondary | ICD-10-CM | POA: Diagnosis not present

## 2023-08-26 ENCOUNTER — Other Ambulatory Visit: Payer: Self-pay | Admitting: Cardiology

## 2023-08-26 ENCOUNTER — Telehealth: Payer: Self-pay | Admitting: Cardiology

## 2023-08-26 NOTE — Telephone Encounter (Signed)
Pt has been scheduled to see Dr. Diona Browner, Friday 08/28/23, clearance will be addressed at that time.    Will route back to the requesting surgeon's office to make them aware.

## 2023-08-26 NOTE — Telephone Encounter (Signed)
   Name: [name] [name]  DOB: 10-27-1961  MRN: [id number]  Primary Cardiologist: Nona Dell, MD  Chart reviewed as part of pre-operative protocol coverage. Because of [name]'s past medical history and time since last visit, he will require a follow-up in-office visit in order to better assess preoperative cardiovascular risk.  Pre-op covering staff: - Please schedule appointment and call patient to inform them. If patient already had an upcoming appointment within acceptable timeframe, please add "pre-op clearance" to the appointment notes so provider is aware. - Please contact requesting surgeon's office via preferred method (i.e, phone, fax) to inform them of need for appointment prior to surgery.   [name], NP  08/26/2023, 2:08 PM

## 2023-08-26 NOTE — Telephone Encounter (Signed)
   Pre-operative Risk Assessment    Patient Name: [name] [name]  DOB: 10/21/1961 MRN: [id number]      Request for Surgical Clearance    Procedure:   Goal C Placement  Date of Surgery:  Clearance 09/08/23                                 Surgeon:  Dr. Leta Jungling Group or Practice Name:  [facility] Phone number:  [phone] Fax number:  [phone]   Type of Clearance Requested:   - Pharmacy:  Hold Aspirin Hold for 5 days   Type of Anesthesia:  MAC   Additional requests/questions:   Would like the patient to hold Aspirin for 5 days.  Signed, Tawni Millers   08/26/2023, 1:57 PM

## 2023-08-27 NOTE — Progress Notes (Signed)
    Cardiology Office Note  Date: 08/28/2023   ID: [name] [name], [name] [name] [birth year], MRN [id number]  History of Present Illness: [name] [name] is a 62 y.o. male last seen in July 2023 by Ms. Lilla Shook, I reviewed the note.  He has prostate cancer and is scheduled to undergo gold seed implant per urology, under MAC with request to hold aspirin.  He is here today with his son.  Reports no angina or nitroglycerin use, stable NYHA class II symptoms.  No palpitations or syncope.  I reviewed his medications, he reports compliance with therapy.  Plan to refill fresh bottle of nitroglycerin.  Baseline cardiac regimen otherwise includes aspirin, Norvasc, Lipitor, Imdur, Lopressor, Aldactone, and Cozaar.  He states that he continues to see Dr. Tiburcio Pea for primary care.  RCRI perioperative cardiac risk index is class III, 6.6% chance of major adverse cardiac event.  He did have a low risk Lexiscan Myoview in 2022 as discussed below.  Follow-up tracing today shows sinus rhythm with right bundle branch block which is old.  Physical Exam: VS:  BP 118/64   Pulse 66   Ht 5\' 7"  (1.702 m)   Wt 198 lb 12.8 oz (90.2 kg)   SpO2 95%   BMI 31.14 kg/m , BMI Body mass index is 31.14 kg/m.  Wt Readings from Last 3 Encounters:  08/28/23 198 lb 12.8 oz (90.2 kg)  07/27/23 198 lb 6.4 oz (90 kg)  07/21/22 219 lb (99.3 kg)    General: Patient appears comfortable at rest. HEENT: Conjunctiva and lids normal. Neck: Supple, no elevated JVP or carotid bruits. Lungs: Clear to auscultation, nonlabored breathing at rest. Thorax: Stable sternum. Cardiac: Regular rate and rhythm, no S3 or significant systolic murmur. Extremities: No pitting edema.  ECG:  An ECG dated 06/06/2021 was personally reviewed today and demonstrated:  Sinus rhythm with PVC and right bundle branch block.  Labwork:  October 2023: BUN 14, creatinine 0.7  Other Studies Reviewed Today:  No interval cardiac testing for review  today.  Assessment and Plan:  1.  CAD status post DES x 2 to the RCA in 2020 complicated by aortic dissection and ultimately requiring repair with SVG to RCA.  LVEF 60 to 65% by echocardiogram in June 2022.  Lexiscan Myoview at that time was low risk without evidence of ischemia.  He does not report any active angina at this time on medical therapy and ECG is stable.  Continue current regimen including aspirin, Lipitor, Imdur, Lopressor, and as needed nitroglycerin.  2.  Preoperative cardiac evaluation.  Patient has prostate cancer with plan for gold seed implant under MAC.  RCRI perioperative cardiac risk index is class III, 6.6% chance of major adverse cardiac event, intermediate range.  ECG is stable and he has no active angina with reassuring ischemic testing within the last 2 years.  He should be able to proceed as planned and may hold aspirin 5 to 7 days prior to procedure.  3.  Essential hypertension.  Blood pressure well-controlled today.  Continue Norvasc, Cozaar, Lopressor, and Aldactone.  4.  Mixed hyperlipidemia.  Continue Lipitor.  Disposition:  Follow up  1 year.  Signed, Jonelle Sidle, M.D., F.A.C.C. [facility]

## 2023-08-28 ENCOUNTER — Ambulatory Visit: Payer: 59 | Attending: Cardiology | Admitting: Cardiology

## 2023-08-28 ENCOUNTER — Encounter: Payer: Self-pay | Admitting: Cardiology

## 2023-08-28 VITALS — BP 118/64 | HR 66 | Ht 67.0 in | Wt 198.8 lb

## 2023-08-28 DIAGNOSIS — I25119 Atherosclerotic heart disease of native coronary artery with unspecified angina pectoris: Secondary | ICD-10-CM | POA: Diagnosis not present

## 2023-08-28 DIAGNOSIS — I10 Essential (primary) hypertension: Secondary | ICD-10-CM

## 2023-08-28 DIAGNOSIS — E782 Mixed hyperlipidemia: Secondary | ICD-10-CM

## 2023-08-28 DIAGNOSIS — Z01810 Encounter for preprocedural cardiovascular examination: Secondary | ICD-10-CM

## 2023-08-28 MED ORDER — NITROGLYCERIN 0.4 MG SL SUBL: 0.4000 mg | SUBLINGUAL_TABLET | SUBLINGUAL | 3 refills | Status: AC | PRN

## 2023-08-28 NOTE — Patient Instructions (Signed)
Medication Instructions:   Your physician recommends that you continue on your current medications as directed. Please refer to the Current Medication list given to you today.   I refilled your NTG  Labwork: None today  Testing/Procedures: None today  Follow-Up: 1 year  Any Other Special Instructions Will Be Listed Below (If Applicable).  If you need a refill on your cardiac medications before your next appointment, please call your pharmacy.

## 2023-09-02 ENCOUNTER — Encounter (HOSPITAL_BASED_OUTPATIENT_CLINIC_OR_DEPARTMENT_OTHER): Payer: Self-pay | Admitting: Urology

## 2023-09-02 ENCOUNTER — Other Ambulatory Visit: Payer: Self-pay

## 2023-09-02 NOTE — Progress Notes (Addendum)
Spoke w/ via phone for pre-op interview---pt and pt son isiah  [place] [facility] needs dos---- I stat              Lab results------see below COVID test -----patient states asymptomatic no test needed Arrive at -------600 am 09-08-2023 NPO after MN NO Solid Food.  Clear liquids from MN until---500 am Med rec completed Medications to take morning of surgery -----isosorbide mononitrate, allopurinol, metoprolol tartrate, nexium, albuterol sulfate prn/bring inhaler, trelegy inhaler, duoneb nebulizer, bupropion Diabetic medication -----n/a Patient instructed no nail polish to be worn day of surgery Patient instructed to bring photo id and insurance card day of surgery Patient aware to have Driver (ride ) / caregiver    for 24 hours after surgery son isiah [place] Patient Special Instructions -----none Pre-Op special Instructions -----none Patient verbalized understanding of instructions that were given at this phone interview. Patient denies shortness of breath, chest pain, fever, cough at this phone interview.  Anesthesia Review:cad s/p des x 2 to the rca complicated by aortic dissection required repair with svg to rca, history of tia 5 or 6 yrs ago  PCP:dr Art therapist Cardiologist :dr s Diona Browner cardiac clearance Theron Arista 08-28-2023 chart/epic Chest x-ray :none EKG :08-28-2023 chart/epic Echo :06-24-2076 Stress test:06-24-2721 epic Cardiac Cath : 02-23-2019 epic Activity level: does own housework, can climb stairs Sleep Study/ CPAP :none ASA / Instructions/ Last Dose : 81 mg asa, pt stated told to stop 81 mg asa 5 days before surgery from dr Lafonda Mosses instructions, note to stop 81 mg asa 5 days before surgery dr s. Mcdowell dated 08-28-2023 chart/epic

## 2023-09-03 NOTE — Progress Notes (Signed)
RN spoke with patient to answer any questions prior to upcoming fiducial marker placement and CT Simulation.  All questions answered.  No additional needs at this time.   Plan of care in progress.

## 2023-09-08 ENCOUNTER — Ambulatory Visit (HOSPITAL_BASED_OUTPATIENT_CLINIC_OR_DEPARTMENT_OTHER)
Admission: RE | Admit: 2023-09-08 | Discharge: 2023-09-08 | Disposition: A | Discharge status: Home / Self Care | Payer: 59 | Attending: Urology | Admitting: Urology

## 2023-09-08 ENCOUNTER — Encounter (HOSPITAL_BASED_OUTPATIENT_CLINIC_OR_DEPARTMENT_OTHER): Payer: Self-pay | Admitting: Urology

## 2023-09-08 ENCOUNTER — Ambulatory Visit (HOSPITAL_BASED_OUTPATIENT_CLINIC_OR_DEPARTMENT_OTHER): Payer: 59 | Admitting: Anesthesiology

## 2023-09-08 ENCOUNTER — Encounter (HOSPITAL_BASED_OUTPATIENT_CLINIC_OR_DEPARTMENT_OTHER)
Admission: RE | Disposition: A | Discharge status: Home / Self Care | Payer: Self-pay | Source: Home / Self Care | Attending: Urology

## 2023-09-08 DIAGNOSIS — I509 Heart failure, unspecified: Secondary | ICD-10-CM

## 2023-09-08 DIAGNOSIS — I129 Hypertensive chronic kidney disease with stage 1 through stage 4 chronic kidney disease, or unspecified chronic kidney disease: Secondary | ICD-10-CM | POA: Diagnosis not present

## 2023-09-08 DIAGNOSIS — I130 Hypertensive heart and chronic kidney disease with heart failure and stage 1 through stage 4 chronic kidney disease, or unspecified chronic kidney disease: Secondary | ICD-10-CM | POA: Diagnosis not present

## 2023-09-08 DIAGNOSIS — F17210 Nicotine dependence, cigarettes, uncomplicated: Secondary | ICD-10-CM | POA: Insufficient documentation

## 2023-09-08 DIAGNOSIS — K219 Gastro-esophageal reflux disease without esophagitis: Secondary | ICD-10-CM | POA: Diagnosis not present

## 2023-09-08 DIAGNOSIS — Z955 Presence of coronary angioplasty implant and graft: Secondary | ICD-10-CM | POA: Diagnosis not present

## 2023-09-08 DIAGNOSIS — N1832 Chronic kidney disease, stage 3b: Secondary | ICD-10-CM | POA: Insufficient documentation

## 2023-09-08 DIAGNOSIS — I2510 Atherosclerotic heart disease of native coronary artery without angina pectoris: Secondary | ICD-10-CM | POA: Insufficient documentation

## 2023-09-08 DIAGNOSIS — C61 Malignant neoplasm of prostate: Secondary | ICD-10-CM | POA: Insufficient documentation

## 2023-09-08 DIAGNOSIS — I110 Hypertensive heart disease with heart failure: Secondary | ICD-10-CM | POA: Diagnosis not present

## 2023-09-08 DIAGNOSIS — Z8673 Personal history of transient ischemic attack (TIA), and cerebral infarction without residual deficits: Secondary | ICD-10-CM | POA: Insufficient documentation

## 2023-09-08 DIAGNOSIS — J449 Chronic obstructive pulmonary disease, unspecified: Secondary | ICD-10-CM | POA: Diagnosis not present

## 2023-09-08 DIAGNOSIS — Z01818 Encounter for other preprocedural examination: Secondary | ICD-10-CM

## 2023-09-08 HISTORY — PX: GOLD SEED IMPLANT: SHX6343

## 2023-09-08 HISTORY — DX: Chronic kidney disease, unspecified: N18.9

## 2023-09-08 HISTORY — PX: SPACE OAR INSTILLATION: SHX6769

## 2023-09-08 HISTORY — DX: Malignant neoplasm of prostate: C61

## 2023-09-08 LAB — POCT I-STAT, CHEM 8
BUN: 19 mg/dL (ref 8–23)
Calcium, Ion: 1.22 mmol/L (ref 1.15–1.40)
Chloride: 102 mmol/L (ref 98–111)
Creatinine, Ser: 0.7 mg/dL (ref 0.61–1.24)
Glucose, Bld: 105 mg/dL — ABNORMAL HIGH (ref 70–99)
HCT: 45 % (ref 39.0–52.0)
Hemoglobin: 15.3 g/dL (ref 13.0–17.0)
Potassium: 4.7 mmol/L (ref 3.5–5.1)
Sodium: 139 mmol/L (ref 135–145)
TCO2: 27 mmol/L (ref 22–32)

## 2023-09-08 SURGERY — INSERTION, GOLD SEEDS
Anesthesia: Monitor Anesthesia Care

## 2023-09-08 MED ORDER — PROPOFOL 10 MG/ML IV BOLUS
INTRAVENOUS | Status: DC | PRN
  Administered 2023-09-08 (×2): 25 mg via INTRAVENOUS

## 2023-09-08 MED ORDER — ACETAMINOPHEN 325 MG PO TABS: 325.0000 mg | ORAL_TABLET | ORAL | Status: DC | PRN

## 2023-09-08 MED ORDER — ACETAMINOPHEN 500 MG PO TABS
1000.0000 mg | ORAL_TABLET | Freq: Once | ORAL | Status: AC
  Administered 2023-09-08: 1000 mg via ORAL

## 2023-09-08 MED ORDER — FENTANYL CITRATE (PF) 100 MCG/2ML IJ SOLN
25.0000 ug | INTRAMUSCULAR | Status: DC | PRN
  Administered 2023-09-08 (×2): 25 ug via INTRAVENOUS

## 2023-09-08 MED ORDER — CELECOXIB 200 MG PO CAPS
ORAL_CAPSULE | ORAL | Status: AC
  Filled 2023-09-08: qty 1

## 2023-09-08 MED ORDER — ONDANSETRON HCL 4 MG/2ML IJ SOLN: 4.0000 mg | Freq: Once | INTRAMUSCULAR | Status: DC | PRN

## 2023-09-08 MED ORDER — ONDANSETRON HCL 4 MG/2ML IJ SOLN
INTRAMUSCULAR | Status: AC
  Filled 2023-09-08: qty 2

## 2023-09-08 MED ORDER — PROPOFOL 500 MG/50ML IV EMUL
INTRAVENOUS | Status: AC
  Filled 2023-09-08: qty 50

## 2023-09-08 MED ORDER — LIDOCAINE HCL 1 % IJ SOLN: INTRAMUSCULAR | Status: DC | PRN

## 2023-09-08 MED ORDER — SODIUM CHLORIDE 0.9 % IV SOLN: INTRAVENOUS | Status: DC

## 2023-09-08 MED ORDER — CEFAZOLIN SODIUM-DEXTROSE 2-4 GM/100ML-% IV SOLN
INTRAVENOUS | Status: AC
  Filled 2023-09-08: qty 100

## 2023-09-08 MED ORDER — ACETAMINOPHEN 160 MG/5ML PO SOLN: 325.0000 mg | ORAL | Status: DC | PRN

## 2023-09-08 MED ORDER — FENTANYL CITRATE (PF) 100 MCG/2ML IJ SOLN
INTRAMUSCULAR | Status: AC
  Filled 2023-09-08: qty 2

## 2023-09-08 MED ORDER — CIPROFLOXACIN IN D5W 400 MG/200ML IV SOLN
INTRAVENOUS | Status: AC
  Filled 2023-09-08: qty 200

## 2023-09-08 MED ORDER — CIPROFLOXACIN IN D5W 400 MG/200ML IV SOLN
400.0000 mg | Freq: Two times a day (BID) | INTRAVENOUS | Status: DC
  Administered 2023-09-08: 400 mg via INTRAVENOUS

## 2023-09-08 MED ORDER — LIDOCAINE HCL (PF) 2 % IJ SOLN
INTRAMUSCULAR | Status: AC
  Filled 2023-09-08: qty 5

## 2023-09-08 MED ORDER — CELECOXIB 200 MG PO CAPS
200.0000 mg | ORAL_CAPSULE | Freq: Once | ORAL | Status: AC
  Administered 2023-09-08: 200 mg via ORAL

## 2023-09-08 MED ORDER — SODIUM CHLORIDE (PF) 0.9 % IJ SOLN
INTRAMUSCULAR | Status: DC | PRN
  Administered 2023-09-08: 10 mL

## 2023-09-08 MED ORDER — FLEET ENEMA 7-19 GM/118ML RE ENEM: 1.0000 | ENEMA | Freq: Once | RECTAL | Status: DC

## 2023-09-08 MED ORDER — MEPERIDINE HCL 25 MG/ML IJ SOLN: 6.2500 mg | INTRAMUSCULAR | Status: DC | PRN

## 2023-09-08 MED ORDER — OXYCODONE HCL 5 MG PO TABS: 5.0000 mg | ORAL_TABLET | Freq: Once | ORAL | Status: DC | PRN

## 2023-09-08 MED ORDER — PROPOFOL 500 MG/50ML IV EMUL
INTRAVENOUS | Status: DC | PRN
  Administered 2023-09-08: 100 ug/kg/min via INTRAVENOUS

## 2023-09-08 MED ORDER — LIDOCAINE 2% (20 MG/ML) 5 ML SYRINGE
INTRAMUSCULAR | Status: DC | PRN
  Administered 2023-09-08: 80 mg via INTRAVENOUS

## 2023-09-08 MED ORDER — ACETAMINOPHEN 500 MG PO TABS
ORAL_TABLET | ORAL | Status: AC
  Filled 2023-09-08: qty 2

## 2023-09-08 MED ORDER — OXYCODONE HCL 5 MG/5ML PO SOLN: 5.0000 mg | Freq: Once | ORAL | Status: DC | PRN

## 2023-09-08 SURGICAL SUPPLY — 27 items
APL SKNCLS STERI-STRIP NONHPOA (GAUZE/BANDAGES/DRESSINGS)
BENZOIN TINCTURE PRP APPL 2/3 (GAUZE/BANDAGES/DRESSINGS) IMPLANT
BLADE CLIPPER SENSICLIP SURGIC (BLADE) ×1 IMPLANT
CNTNR URN SCR LID CUP LEK RST (MISCELLANEOUS) ×1 IMPLANT
CONT SPEC 4OZ STRL OR WHT (MISCELLANEOUS) ×1
COVER BACK TABLE 60X90IN (DRAPES) ×1 IMPLANT
DRSG TEGADERM 4X4.75 (GAUZE/BANDAGES/DRESSINGS) ×1 IMPLANT
DRSG TEGADERM 8X12 (GAUZE/BANDAGES/DRESSINGS) ×1 IMPLANT
GAUZE SPONGE 4X4 12PLY STRL (GAUZE/BANDAGES/DRESSINGS) ×1 IMPLANT
GAUZE SPONGE 4X4 3PLY NS LF (GAUZE/BANDAGES/DRESSINGS) IMPLANT
GLOVE BIO SURGEON STRL SZ7 (GLOVE) ×1 IMPLANT
IMPL SPACEOAR VUE SYSTEM (Spacer) IMPLANT
IMPLANT SPACEOAR VUE SYSTEM (Spacer) ×1 IMPLANT
KIT TURNOVER CYSTO (KITS) ×1 IMPLANT
MARKER GOLD PRELOAD 1.2X3 (Urological Implant) ×1 IMPLANT
MARKER SKIN DUAL TIP RULER LAB (MISCELLANEOUS) ×1 IMPLANT
NDL SPNL 22GX3.5 QUINCKE BK (NEEDLE) IMPLANT
NEEDLE SPNL 22GX3.5 QUINCKE BK (NEEDLE)
SEED GOLD PRELOAD 1.2X3 (Urological Implant) ×1 IMPLANT
SHEATH ULTRASOUND LF (SHEATH) IMPLANT
SHEATH ULTRASOUND LTX NONSTRL (SHEATH) IMPLANT
SLEEVE SCD COMPRESS KNEE MED (STOCKING) ×1 IMPLANT
SURGILUBE 2OZ TUBE FLIPTOP (MISCELLANEOUS) ×1 IMPLANT
SYR 10ML LL (SYRINGE) IMPLANT
SYR CONTROL 10ML LL (SYRINGE) ×1 IMPLANT
TOWEL OR 17X24 6PK STRL BLUE (TOWEL DISPOSABLE) ×1 IMPLANT
UNDERPAD 30X36 HEAVY ABSORB (UNDERPADS AND DIAPERS) ×1 IMPLANT

## 2023-09-08 NOTE — Transfer of Care (Signed)
Immediate Anesthesia Transfer of Care Note  Patient: [name] [name]  Procedure(s) Performed: GOLD SEED IMPLANT SPACE OAR INSTILLATION  Patient Location: PACU  Anesthesia Type:MAC  Level of Consciousness: drowsy  Airway & Oxygen Therapy: Patient Spontanous Breathing and Patient connected to face mask oxygen  Post-op Assessment: Report given to RN  Post vital signs: Reviewed and stable  Last Vitals:  Vitals Value Taken Time  BP 128/64   Temp    Pulse 52 09/08/23 0904  Resp 18   SpO2 99 % 09/08/23 0904  Vitals shown include unfiled device data.  Last Pain:  Vitals:   09/08/23 0641  TempSrc: Oral  PainSc: 6       Patients Stated Pain Goal: 3 (09/08/23 0641)  Complications: No notable events documented.

## 2023-09-08 NOTE — Anesthesia Preprocedure Evaluation (Addendum)
Anesthesia Evaluation  Patient identified by MRN, date of birth, ID band Patient awake    Reviewed: Allergy & Precautions, NPO status , Patient's Chart, lab work & pertinent test results  Airway Mallampati: I  TM Distance: >3 FB Neck ROM: Full    Dental no notable dental hx. (+) Edentulous Upper, Missing, Dental Advisory Given, Poor Dentition,    Pulmonary COPD,  COPD inhaler, Current Smoker and Patient abstained from smoking., former smoker   Pulmonary exam normal breath sounds clear to auscultation       Cardiovascular hypertension, Pt. on medications and Pt. on home beta blockers + CAD, + CABG and +CHF  Normal cardiovascular exam Rhythm:Regular Rate:Normal     Neuro/Psych TIACVA    GI/Hepatic ,GERD  Medicated,,  Endo/Other  negative endocrine ROS    Renal/GU negative Renal ROS     Musculoskeletal   Abdominal   Peds  Hematology negative hematology ROS (+)   Anesthesia Other Findings   Reproductive/Obstetrics                             Anesthesia Physical Anesthesia Plan  ASA: 3  Anesthesia Plan: MAC   Post-op Pain Management:  Regional for Post-op pain and Minimal or no pain anticipated, Tylenol PO (pre-op)* and Celebrex PO (pre-op)*   Induction: Intravenous  PONV Risk Score and Plan: 2 and Treatment may vary due to age or medical condition, Ondansetron and Propofol infusion  Airway Management Planned: Natural Airway, Nasal Cannula and Simple Face Mask  Additional Equipment: None  Intra-op Plan:   Post-operative Plan:   Informed Consent: I have reviewed the patients History and Physical, chart, labs and discussed the procedure including the risks, benefits and alternatives for the proposed anesthesia with the patient or authorized representative who has indicated his/her understanding and acceptance.     Dental advisory given  Plan Discussed with: CRNA and  Anesthesiologist  Anesthesia Plan Comments:         Anesthesia Quick Evaluation

## 2023-09-08 NOTE — Op Note (Signed)
Preoperative diagnosis: Clinically localized adenocarcinoma of the prostate   Postoperative diagnosis: Clinically localized adenocarcinoma of the prostate  Procedure: 1) Placement of fiducial markers into prostate                    2) Insertion of SpaceOAR hydrogel   Surgeon:Luke Krishna Dancel. M.D.  Anesthesia: General  EBL: Minimal  Complications: None  Indication: [name] [name] is a 62 y.o. gentleman with clinically localized prostate cancer. After discussing management options for treatment, he elected to proceed with radiotherapy. He presents today for the above procedures. The potential risks, complications, alternative options, and expected recovery course have been discussed in detail with the patient and he has provided informed consent to proceed.  Description of procedure: The patient was administered preoperative antibiotics, placed in the dorsal lithotomy position, and prepped and draped in the usual sterile fashion. Next, transrectal ultrasonography was utilized to visualize the prostate. Three gold fiducial markers were then placed into the prostate via transperineal needles under ultrasound guidance at the left apex, left base, and right mid gland under direct ultrasound guidance. A site in the midline was then selected on the perineum for placement of an 18 g needle with saline. The needle was advanced above the rectum and below Denonvillier's fascia to the mid gland and confirmed to be in the midline on transverse imaging. One cc of saline was injected confirming appropriate expansion of this space. A total of 5 cc of saline was then injected to open the space further bilaterally. The saline syringe was then removed and the SpaceOAR hydrogel was injected with good distribution bilaterally. He tolerated the procedure well and without complications. He was given a voiding trial prior to discharge from the PACU.

## 2023-09-08 NOTE — Discharge Instructions (Addendum)
You should avoid strenuous activities today but may resume all normal activities tomorrow.  2.   You can take Tylenol as needed for any pain or discomfort.  3.    Follow up with your radiation oncologist for your simulation appointment as scheduled.  If this is not currently scheduled or you do not know the date/time for that appointment, please contact the radiation oncology office to confirm.  Post Anesthesia Home Care Instructions  Activity: Get plenty of rest for the remainder of the day. A responsible adult should stay with you for 24 hours following the procedure.  For the next 24 hours, DO NOT: -Drive a car -Paediatric nurse -Drink alcoholic beverages -Take any medication unless instructed by your physician -Make any legal decisions or sign important papers.  Meals: Start with liquid foods such as gelatin or soup. Progress to regular foods as tolerated. Avoid greasy, spicy, heavy foods. If nausea and/or vomiting occur, drink only clear liquids until the nausea and/or vomiting subsides. Call your physician if vomiting continues.  Special Instructions/Symptoms: Your throat may feel dry or sore from the anesthesia or the breathing tube placed in your throat during surgery. If this causes discomfort, gargle with warm salt water. The discomfort should disappear within 24 hours.

## 2023-09-08 NOTE — Anesthesia Postprocedure Evaluation (Signed)
Anesthesia Post Note  Patient: [name] [name]  Procedure(s) Performed: GOLD SEED IMPLANT SPACE OAR INSTILLATION     Patient location during evaluation: PACU Anesthesia Type: MAC Level of consciousness: awake and alert Pain management: pain level controlled Vital Signs Assessment: post-procedure vital signs reviewed and stable Respiratory status: spontaneous breathing, nonlabored ventilation, respiratory function stable and patient connected to nasal cannula oxygen Cardiovascular status: stable and blood pressure returned to baseline Postop Assessment: no apparent nausea or vomiting Anesthetic complications: no   No notable events documented.  Last Vitals:  Vitals:   09/08/23 0946 09/08/23 1030  BP: 131/79 125/83  Pulse:  (!) 49  Resp: 18 16  Temp: (!) 36.3 C (!) 36.3 C  SpO2:  95%    Last Pain:  Vitals:   09/08/23 1030  TempSrc: Oral  PainSc: 2    Pain Goal: Patients Stated Pain Goal: 5 (09/08/23 1030)                 Kassity Woodson

## 2023-09-08 NOTE — H&P (Signed)
H&P  History of Present Illness: [name] [name] is a 62 y.o. year old M who presents today for gold seed + spaceOAR placement  Past Medical History:  Diagnosis Date   Arthritis    Barrett's esophagus 05/22/2015   Chronic back pain    Chronic kidney disease stage 3 B    COPD (chronic obstructive pulmonary disease) (HCC)    Coronary artery disease    a. cath in 2010 showing 20% distal LM, 70-80% Ost LAD, 30% OM, and 40% RCA b. low-risk NST in 2014 and 2016 c. 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 Underwent repair of dissection and SVG-RCA.    Essential hypertension    GERD (gastroesophageal reflux disease)    Gout    Hemorrhoids    History of alcohol abuse    Remote   History of kidney stones    Hyperlipidemia    Lymphocytic colitis 2009   NASH (nonalcoholic steatohepatitis)    Personal history of colonic polyps 07/20/2002   Diminutive adenomas (3) 06/2002 diminutive adenoma (1) 2011    Pre-diabetes    Prostate cancer (HCC)    TIA (transient ischemic attack)    5 to 6 yrs ago x 1 none since   Urinary urgency     Past Surgical History:  Procedure Laterality Date   25 GAUGE PARS PLANA VITRECTOMY WITH 20 GAUGE MVR PORT N/A 03/25/2019   Procedure: WOUND VAC CHANGE AND IRRIGATION;  Surgeon: Kerin Perna, MD;  Location: [facility] OR;  Service: Thoracic;  Laterality: N/A;   ANEURYSM COILING  12/2010   cerebral   APPLICATION OF WOUND VAC N/A 03/22/2019   Procedure: APPLICATION OF WOUND VAC;  Surgeon: Kerin Perna, MD;  Location: [facility] OR;  Service: Thoracic;  Laterality: N/A;   APPLICATION OF WOUND VAC N/A 03/29/2019   Procedure: WOUND VAC CHANGE;  Surgeon: Kerin Perna, MD;  Location: [facility] OR;  Service: Thoracic;  Laterality: N/A;   APPLICATION OF WOUND VAC N/A 03/31/2019   Procedure: WOUND VAC CHANGE;  Surgeon: Kerin Perna, MD;  Location: [facility] OR;  Service: Thoracic;   Laterality: N/A;   BAND HEMORRHOIDECTOMY  2003   at sigmoidoscopy   CARDIAC CATHETERIZATION  2010   CARPAL TUNNEL RELEASE Right 11/14/2019   Procedure: Right carpal tunnel release, Right cubital tunnel release;  Surgeon: Ernest Mallick, MD;  Location: [facility];  Service: Orthopedics;  Laterality: Right;  block in preop   CHOLECYSTECTOMY  08/03/2015   CHOLECYSTECTOMY N/A 08/03/2015   Procedure: LAPAROSCOPIC CHOLECYSTECTOMY WITH INTRAOPERATIVE CHOLANGIOGRAM;  Surgeon: Claud Kelp, MD;  Location: [facility] OR;  Service: General;  Laterality: N/A;   COLONOSCOPY  multiple   CORONARY ARTERY BYPASS GRAFT N/A 02/23/2019   Procedure: CORONARY ARTERY BYPASS GRAFTING (CABG) x 1; Using Endoscopically Harvested Right Leg Greater Saphenous Vein Graft (SVG); SVG to RCA;  Surgeon: Kerin Perna, MD;  Location: [facility] OR;  Service: Open Heart Surgery;  Laterality: N/A;   CORONARY STENT INTERVENTION N/A 02/23/2019   Procedure: CORONARY STENT INTERVENTION;  Surgeon: Swaziland, Peter M, MD;  Location: [facility] INVASIVE CV LAB;  Service: Cardiovascular;  Laterality: N/A;   ESOPHAGOGASTRODUODENOSCOPY     KNEE ARTHROSCOPY WITH MEDIAL MENISECTOMY Left 05/20/2017   Procedure: LEFT KNEE ARTHROSCOPY WITH PARTIAL MEDIAL MENISCECTOMY;  Surgeon: Tarry Kos, MD;  Location: [facility];  Service: Orthopedics;  Laterality: Left;   LEFT HEART  CATH AND CORONARY ANGIOGRAPHY N/A 02/23/2019   Procedure: LEFT HEART CATH AND CORONARY ANGIOGRAPHY;  Surgeon: Swaziland, Peter M, MD;  Location: [facility] INVASIVE CV LAB;  Service: Cardiovascular;  Laterality: N/A;   LUMBAR SPINE SURGERY     x 1   NASAL SINUS SURGERY     PECTORALIS FLAP N/A 04/04/2019   Procedure: right pectoralis muscle flap for sternal wound reconstruction and VAC placement;  Surgeon: Peggye Form, DO;  Location: [facility] OR;  Service: Plastics;  Laterality: N/A;  2.5 hours case length   PROSTATE BIOPSY     REPAIR OF ACUTE ASCENDING THORACIC  AORTIC DISSECTION N/A 02/23/2019   Procedure: REPAIR OF TYPE A  - ACUTE ASCENDING THORACIC AORTIC DISSECTION, using Hemashield Platinum Woven Double Velour Vascular Graft (D: 26mm, L: 30cm);  Surgeon: Donata Clay, Theron Arista, MD;  Location: [facility] OR;  Service: Vascular;  Laterality: N/A;   STERNAL WOUND DEBRIDEMENT N/A 03/22/2019   Procedure: STERNAL WOUND DEBRIDEMENT;  Surgeon: Kerin Perna, MD;  Location: [facility] OR;  Service: Thoracic;  Laterality: N/A;   STERNAL WOUND DEBRIDEMENT N/A 03/25/2019   Procedure: SUPERFICIAL STERNAL WOUND DEBRIDEMENT;  Surgeon: Kerin Perna, MD;  Location: [facility] OR;  Service: Thoracic;  Laterality: N/A;   STERNAL WOUND DEBRIDEMENT N/A 03/29/2019   Procedure: SUPERFICIAL STERNAL WOUND DEBRIDEMENT;  Surgeon: Kerin Perna, MD;  Location: [facility] OR;  Service: Thoracic;  Laterality: N/A;   STERNAL WOUND DEBRIDEMENT N/A 03/31/2019   Procedure: STERNAL WOUND DEBRIDEMENT USING 1G ACEL POWDER;  Surgeon: Kerin Perna, MD;  Location: [facility] OR;  Service: Thoracic;  Laterality: N/A;   STERNAL WOUND DEBRIDEMENT N/A 04/04/2019   Procedure: STERNAL WOUND DEBRIDEMENT;  Surgeon: Kerin Perna, MD;  Location: [facility] OR;  Service: Thoracic;  Laterality: N/A;   TEE WITHOUT CARDIOVERSION  05/28/2012   Procedure: TRANSESOPHAGEAL ECHOCARDIOGRAM (TEE);  Surgeon: Lewayne Bunting, MD;  Location: [facility] ENDOSCOPY;  Service: Cardiovascular;  Laterality: N/A;   TEE WITHOUT CARDIOVERSION N/A 02/23/2019   Procedure: TRANSESOPHAGEAL ECHOCARDIOGRAM (TEE);  Surgeon: Donata Clay, Theron Arista, MD;  Location: [facility] OR;  Service: Open Heart Surgery;  Laterality: N/A;    Home Medications:  Current Meds  Medication Sig   allopurinol (ZYLOPRIM) 100 MG tablet Take 100 mg by mouth daily.    amLODipine (NORVASC) 10 MG tablet Take 10 mg by mouth daily.   aspirin EC 81 MG EC tablet Take 1 tablet (81 mg total) by mouth daily.   atorvastatin (LIPITOR) 80 MG tablet Take 1 tablet (80 mg total) by mouth daily at 6 PM.   buPROPion  (WELLBUTRIN XL) 300 MG 24 hr tablet 1 tablet in the morning Orally Once a day for 30 days   doxycycline (VIBRA-TABS) 100 MG tablet Take 100 mg by mouth 2 (two) times daily.   esomeprazole (NEXIUM) 40 MG capsule Take 40 mg by mouth 2 (two) times daily.   isosorbide mononitrate (IMDUR) 30 MG 24 hr tablet TAKE 1 TABLET BY MOUTH ONCE DAILY . APPOINTMENT REQUIRED FOR FUTURE REFILLS   losartan (COZAAR) 100 MG tablet Take 1 tablet (100 mg total) by mouth daily.   metoprolol tartrate (LOPRESSOR) 50 MG tablet Take 1 tablet by mouth twice daily   Multiple Vitamin (MULITIVITAMIN WITH MINERALS) TABS Take 1 tablet by mouth daily.   naproxen (NAPROSYN) 500 MG tablet Take 500 mg by mouth 2 (two) times daily.   Omega-3 Fatty Acids (FISH OIL) 1200 MG CPDR Take 1,200 mg by mouth daily.   spironolactone (ALDACTONE) 25 MG tablet Take  0.5 tablets (12.5 mg total) by mouth daily.   traZODone (DESYREL) 100 MG tablet Take 100 mg by mouth at bedtime.    TRELEGY ELLIPTA 100-62.5-25 MCG/ACT AEPB Take 1 puff by mouth daily.    Allergies:  Allergies  Allergen Reactions   Penicillins Hives, Rash and Other (See Comments)    Did it involve swelling of the face/tongue/throat, SOB, or low BP? Yes Did it involve sudden or severe rash/hives, skin peeling, or any reaction on the inside of your mouth or nose? Yes  Did you need to seek medical attention at a hospital or doctor's office? Yes When did it last happen? As a child.      If all above answers are "NO", may proceed with cephalosporin use.    Gabapentin     Other Reaction(s): hurts muscles   Isosorbide Nitrate     Other Reaction(s): severe headaches    Family History  Problem Relation Age of Onset   Liver disease Mother    Liver cancer Mother    Breast cancer Mother    Cancer Mother    Hypertension Mother    Heart attack Mother    Cancer Father    Heart disease Father        before age 34   Hyperlipidemia Father    Hypertension Father    Heart attack  Father    Heart disease Other        multiple family members on maternal and paternal side of family   Diabetes Sister    Hyperlipidemia Sister    Hypertension Sister    Diabetes Paternal Grandmother    Hypertension Brother    Colon cancer Neg Hx     Social History:  reports that he has been smoking cigarettes. He started smoking about 38 years ago. He has a 40.7 pack-year smoking history. He has never used smokeless tobacco. He reports that he does not drink alcohol and does not use drugs.  ROS: A complete review of systems was performed.  All systems are negative except for pertinent findings as noted.  Physical Exam:  Vital signs in last 24 hours: Temp:  [97.9 F (36.6 C)] 97.9 F (36.6 C) (09/10 0641) Pulse Rate:  [83] 83 (09/10 0641) Resp:  [17] 17 (09/10 0641) BP: (145)/(80) 145/80 (09/10 0641) SpO2:  [96 %] 96 % (09/10 0641) Weight:  [88.9 kg] 88.9 kg (09/10 0641) Constitutional:  Alert and oriented, No acute distress Cardiovascular: Regular rate and rhythm Respiratory: Normal respiratory effort, Lungs clear bilaterally GI: Abdomen is soft, nontender, nondistended, no abdominal masses Lymphatic: No lymphadenopathy Neurologic: Grossly intact, no focal deficits Psychiatric: Normal mood and affect   Laboratory Data:  Recent Labs    09/08/23 0655  HGB 15.3  HCT 45.0    Recent Labs    09/08/23 0655  NA 139  K 4.7  CL 102  GLUCOSE 105*  BUN 19  CREATININE 0.70     Results for orders placed or performed during the hospital encounter of 09/08/23 (from the past 24 hour(s))  I-STAT, chem 8     Status: Abnormal   Collection Time: 09/08/23  6:55 AM  Result Value Ref Range   Sodium 139 135 - 145 mmol/L   Potassium 4.7 3.5 - 5.1 mmol/L   Chloride 102 98 - 111 mmol/L   BUN 19 8 - 23 mg/dL   Creatinine, Ser 1.02 0.61 - 1.24 mg/dL   Glucose, Bld 725 (H) 70 - 99 mg/dL   Calcium, Ion  1.22 1.15 - 1.40 mmol/L   TCO2 27 22 - 32 mmol/L   Hemoglobin 15.3 13.0 - 17.0  g/dL   HCT 16.1 09.6 - 04.5 %   No results found for this or any previous visit (from the past 240 hour(s)).  Renal Function: Recent Labs    09/08/23 0655  CREATININE 0.70   Estimated Creatinine Clearance: 103.7 mL/min (by C-G formula based on SCr of 0.7 mg/dL).  Radiologic Imaging: No results found.  Assessment:  [name] [name] is a 62 y.o. year old M with prostate cancer, planning to undergo radiation treatment  Plan:  To OR as planned. Procedure and risks reviewed  Irine Seal, MD 09/08/2023, 7:25 AM  [facility] Pager: [phone]

## 2023-09-08 NOTE — Anesthesia Procedure Notes (Signed)
Procedure Name: MAC Date/Time: 09/08/2023 8:36 AM  Performed by: Briant Sites, CRNAPre-anesthesia Checklist: Patient identified, Emergency Drugs available, Suction available, Patient being monitored and Timeout performed Patient Re-evaluated:Patient Re-evaluated prior to induction Oxygen Delivery Method: Simple face mask Placement Confirmation: positive ETCO2

## 2023-09-09 ENCOUNTER — Encounter (HOSPITAL_BASED_OUTPATIENT_CLINIC_OR_DEPARTMENT_OTHER): Payer: Self-pay | Admitting: Urology

## 2023-09-11 ENCOUNTER — Telehealth: Payer: Self-pay | Admitting: *Deleted

## 2023-09-11 NOTE — Telephone Encounter (Signed)
Called patient to remind of sim appt. for 09-14-23- arrival time- 2:45 pm @ [facility], informed patient to arrive with a full bladder, spoke with patient and he is aware of this appt. and the instructions

## 2023-09-14 ENCOUNTER — Ambulatory Visit
Admission: RE | Admit: 2023-09-14 | Discharge: 2023-09-14 | Disposition: A | Discharge status: Home / Self Care | Payer: 59 | Source: Ambulatory Visit | Attending: Radiation Oncology | Admitting: Radiation Oncology

## 2023-09-14 DIAGNOSIS — C61 Malignant neoplasm of prostate: Secondary | ICD-10-CM | POA: Diagnosis present

## 2023-09-14 DIAGNOSIS — Z510 Encounter for antineoplastic radiation therapy: Secondary | ICD-10-CM | POA: Insufficient documentation

## 2023-09-14 DIAGNOSIS — Z191 Hormone sensitive malignancy status: Secondary | ICD-10-CM | POA: Diagnosis not present

## 2023-09-14 NOTE — Progress Notes (Signed)
Radiation Oncology         [phone] ________________________________  Name: [name] [name] MRN: [id number]  Date: 09/14/2023  DOB: [birth year]  SIMULATION AND TREATMENT PLANNING NOTE    ICD-10-CM   1. Malignant neoplasm of prostate (HCC)  C61       DIAGNOSIS:  62 y.o. 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   NARRATIVE:  The patient was brought to the CT Simulation planning suite.  Identity was confirmed.  All relevant records and images related to the planned course of therapy were reviewed.  The patient freely provided informed written consent to proceed with treatment after reviewing the details related to the planned course of therapy. The consent form was witnessed and verified by the simulation staff.  Then, the patient was set-up in a stable reproducible supine position for radiation therapy.  A vacuum lock pillow device was custom fabricated to position his legs in a reproducible immobilized position.  Then, supervised the performance of a urethrogram under sterile conditions to identify the prostatic apex.  CT images were obtained.  Surface markings were placed.  The CT images were loaded into the planning software.  Then the prostate target and avoidance structures including the rectum, bladder, bowel and hips were contoured.  Treatment planning then occurred.  The radiation prescription was entered and confirmed.  A total of one complex treatment devices was fabricated. I have requested : Intensity Modulated Radiotherapy (IMRT) is medically necessary for this case for the following reason:  Rectal sparing.  I have requested daily cone beam CT volumetric image gudiance to track gold fiducial posiitoning along with bladder and rectal filling, this is medically necessary to assure accurate positioning of high dose radiation.  PLAN:  The patient will receive 70 Gy in 28  fractions.  ________________________________  Artist Pais Kathrynn Running, M.D.

## 2023-09-15 DIAGNOSIS — Z191 Hormone sensitive malignancy status: Secondary | ICD-10-CM | POA: Diagnosis not present

## 2023-09-15 DIAGNOSIS — Z510 Encounter for antineoplastic radiation therapy: Secondary | ICD-10-CM | POA: Diagnosis not present

## 2023-09-15 DIAGNOSIS — C61 Malignant neoplasm of prostate: Secondary | ICD-10-CM | POA: Diagnosis not present

## 2023-09-20 ENCOUNTER — Other Ambulatory Visit: Payer: Self-pay | Admitting: Cardiology

## 2023-09-28 DIAGNOSIS — I10 Essential (primary) hypertension: Secondary | ICD-10-CM | POA: Diagnosis not present

## 2023-09-28 DIAGNOSIS — K219 Gastro-esophageal reflux disease without esophagitis: Secondary | ICD-10-CM | POA: Diagnosis not present

## 2023-09-28 DIAGNOSIS — E1169 Type 2 diabetes mellitus with other specified complication: Secondary | ICD-10-CM | POA: Diagnosis not present

## 2023-09-28 DIAGNOSIS — I2510 Atherosclerotic heart disease of native coronary artery without angina pectoris: Secondary | ICD-10-CM | POA: Diagnosis not present

## 2023-09-28 DIAGNOSIS — F17200 Nicotine dependence, unspecified, uncomplicated: Secondary | ICD-10-CM | POA: Diagnosis not present

## 2023-09-28 DIAGNOSIS — F5101 Primary insomnia: Secondary | ICD-10-CM | POA: Diagnosis not present

## 2023-09-28 DIAGNOSIS — E119 Type 2 diabetes mellitus without complications: Secondary | ICD-10-CM | POA: Diagnosis not present

## 2023-09-28 DIAGNOSIS — J449 Chronic obstructive pulmonary disease, unspecified: Secondary | ICD-10-CM | POA: Diagnosis not present

## 2023-09-28 DIAGNOSIS — M109 Gout, unspecified: Secondary | ICD-10-CM | POA: Diagnosis not present

## 2023-09-30 ENCOUNTER — Ambulatory Visit
Admission: RE | Admit: 2023-09-30 | Discharge: 2023-09-30 | Disposition: A | Discharge status: Home / Self Care | Payer: 59 | Source: Ambulatory Visit | Attending: Radiation Oncology | Admitting: Radiation Oncology

## 2023-09-30 ENCOUNTER — Other Ambulatory Visit: Payer: Self-pay

## 2023-09-30 DIAGNOSIS — Z510 Encounter for antineoplastic radiation therapy: Secondary | ICD-10-CM | POA: Insufficient documentation

## 2023-09-30 DIAGNOSIS — Z191 Hormone sensitive malignancy status: Secondary | ICD-10-CM | POA: Diagnosis not present

## 2023-09-30 DIAGNOSIS — R300 Dysuria: Secondary | ICD-10-CM | POA: Insufficient documentation

## 2023-09-30 DIAGNOSIS — C61 Malignant neoplasm of prostate: Secondary | ICD-10-CM | POA: Insufficient documentation

## 2023-09-30 LAB — RAD ONC ARIA SESSION SUMMARY
Course Elapsed Days: 0
Plan Fractions Treated to Date: 1
Plan Prescribed Dose Per Fraction: 2.5 Gy
Plan Total Fractions Prescribed: 28
Plan Total Prescribed Dose: 70 Gy
Reference Point Dosage Given to Date: 2.5 Gy
Reference Point Session Dosage Given: 2.5 Gy
Session Number: 1

## 2023-10-01 ENCOUNTER — Ambulatory Visit
Admission: RE | Admit: 2023-10-01 | Discharge: 2023-10-01 | Disposition: A | Discharge status: Home / Self Care | Payer: 59 | Source: Ambulatory Visit | Attending: Radiation Oncology | Admitting: Radiation Oncology

## 2023-10-01 ENCOUNTER — Other Ambulatory Visit: Payer: Self-pay

## 2023-10-01 DIAGNOSIS — Z510 Encounter for antineoplastic radiation therapy: Secondary | ICD-10-CM | POA: Diagnosis not present

## 2023-10-01 DIAGNOSIS — Z191 Hormone sensitive malignancy status: Secondary | ICD-10-CM | POA: Diagnosis not present

## 2023-10-01 DIAGNOSIS — R300 Dysuria: Secondary | ICD-10-CM | POA: Diagnosis not present

## 2023-10-01 DIAGNOSIS — C61 Malignant neoplasm of prostate: Secondary | ICD-10-CM | POA: Diagnosis not present

## 2023-10-01 LAB — RAD ONC ARIA SESSION SUMMARY
Course Elapsed Days: 1
Plan Fractions Treated to Date: 2
Plan Prescribed Dose Per Fraction: 2.5 Gy
Plan Total Fractions Prescribed: 28
Plan Total Prescribed Dose: 70 Gy
Reference Point Dosage Given to Date: 5 Gy
Reference Point Session Dosage Given: 2.5 Gy
Session Number: 2

## 2023-10-02 ENCOUNTER — Ambulatory Visit
Admission: RE | Admit: 2023-10-02 | Discharge: 2023-10-02 | Disposition: A | Discharge status: Home / Self Care | Payer: 59 | Source: Ambulatory Visit | Attending: Radiation Oncology

## 2023-10-02 ENCOUNTER — Other Ambulatory Visit: Payer: Self-pay

## 2023-10-02 ENCOUNTER — Ambulatory Visit: Admission: RE | Admit: 2023-10-02 | Payer: 59 | Source: Ambulatory Visit

## 2023-10-02 DIAGNOSIS — R300 Dysuria: Secondary | ICD-10-CM | POA: Diagnosis not present

## 2023-10-02 DIAGNOSIS — Z510 Encounter for antineoplastic radiation therapy: Secondary | ICD-10-CM | POA: Diagnosis not present

## 2023-10-02 DIAGNOSIS — Z191 Hormone sensitive malignancy status: Secondary | ICD-10-CM | POA: Diagnosis not present

## 2023-10-02 DIAGNOSIS — C61 Malignant neoplasm of prostate: Secondary | ICD-10-CM | POA: Diagnosis not present

## 2023-10-02 LAB — RAD ONC ARIA SESSION SUMMARY
Course Elapsed Days: 2
Plan Fractions Treated to Date: 3
Plan Prescribed Dose Per Fraction: 2.5 Gy
Plan Total Fractions Prescribed: 28
Plan Total Prescribed Dose: 70 Gy
Reference Point Dosage Given to Date: 7.5 Gy
Reference Point Session Dosage Given: 2.5 Gy
Session Number: 3

## 2023-10-05 ENCOUNTER — Other Ambulatory Visit: Payer: Self-pay

## 2023-10-05 ENCOUNTER — Ambulatory Visit
Admission: RE | Admit: 2023-10-05 | Discharge: 2023-10-05 | Disposition: A | Discharge status: Home / Self Care | Payer: 59 | Source: Ambulatory Visit | Attending: Radiation Oncology

## 2023-10-05 DIAGNOSIS — C61 Malignant neoplasm of prostate: Secondary | ICD-10-CM | POA: Diagnosis not present

## 2023-10-05 DIAGNOSIS — Z510 Encounter for antineoplastic radiation therapy: Secondary | ICD-10-CM | POA: Diagnosis not present

## 2023-10-05 DIAGNOSIS — R300 Dysuria: Secondary | ICD-10-CM | POA: Diagnosis not present

## 2023-10-05 DIAGNOSIS — Z191 Hormone sensitive malignancy status: Secondary | ICD-10-CM | POA: Diagnosis not present

## 2023-10-05 LAB — RAD ONC ARIA SESSION SUMMARY
Course Elapsed Days: 5
Plan Fractions Treated to Date: 4
Plan Prescribed Dose Per Fraction: 2.5 Gy
Plan Total Fractions Prescribed: 28
Plan Total Prescribed Dose: 70 Gy
Reference Point Dosage Given to Date: 10 Gy
Reference Point Session Dosage Given: 2.5 Gy
Session Number: 4

## 2023-10-06 ENCOUNTER — Ambulatory Visit
Admission: RE | Admit: 2023-10-06 | Discharge: 2023-10-06 | Disposition: A | Discharge status: Home / Self Care | Payer: 59 | Source: Ambulatory Visit | Attending: Radiation Oncology | Admitting: Radiation Oncology

## 2023-10-06 ENCOUNTER — Other Ambulatory Visit: Payer: Self-pay

## 2023-10-06 DIAGNOSIS — Z510 Encounter for antineoplastic radiation therapy: Secondary | ICD-10-CM | POA: Diagnosis not present

## 2023-10-06 DIAGNOSIS — R300 Dysuria: Secondary | ICD-10-CM | POA: Diagnosis not present

## 2023-10-06 DIAGNOSIS — C61 Malignant neoplasm of prostate: Secondary | ICD-10-CM | POA: Diagnosis not present

## 2023-10-06 DIAGNOSIS — Z191 Hormone sensitive malignancy status: Secondary | ICD-10-CM | POA: Diagnosis not present

## 2023-10-06 LAB — RAD ONC ARIA SESSION SUMMARY
Course Elapsed Days: 6
Plan Fractions Treated to Date: 5
Plan Prescribed Dose Per Fraction: 2.5 Gy
Plan Total Fractions Prescribed: 28
Plan Total Prescribed Dose: 70 Gy
Reference Point Dosage Given to Date: 12.5 Gy
Reference Point Session Dosage Given: 2.5 Gy
Session Number: 5

## 2023-10-07 ENCOUNTER — Ambulatory Visit: Payer: 59

## 2023-10-08 ENCOUNTER — Ambulatory Visit: Payer: 59

## 2023-10-09 ENCOUNTER — Ambulatory Visit: Payer: 59

## 2023-10-12 ENCOUNTER — Ambulatory Visit: Payer: 59

## 2023-10-13 ENCOUNTER — Ambulatory Visit: Payer: 59

## 2023-10-13 MED ORDER — PROCHLORPERAZINE MALEATE 10 MG PO TABS: 10.0000 mg | ORAL_TABLET | Freq: Four times a day (QID) | ORAL | 0 refills | Status: AC | PRN

## 2023-10-13 MED ORDER — TAMSULOSIN HCL 0.4 MG PO CAPS: 0.4000 mg | ORAL_CAPSULE | Freq: Every day | ORAL | 2 refills | Status: DC

## 2023-10-13 NOTE — Progress Notes (Signed)
RN was notified that patient wished to discontinue his radiation treatment.  I spoke with the patient and the patient's son and he is experiencing dysuria, no fevers or flank pain, and having a hard time starting his stream, and feels as if he is unable to empty his bladder.  This is happening during day and also at night. Previously was getting up around 14 times a night prior to treatment.  Also, has some concerns because he has been experiencing nausea, and lack of appetite.     Patient had previous LUTS prior to starting treatment and was sent an Rx for Flomax 0.4mg  daily.  Patient does not believe he is currently taking this.   Patient is agreeable for recommendations to help symptom management and resume treatment.   RN reviewed with MD - per MD recommendations patient will re-start Flomax 0.4mg  daily and compazine PRN nausea.  Patient will resume treatment on 10/17.  RN educated patient on risk of stopping treatment related to increase chance of cancer becoming metastatic.    Plan of care in progress.  RN will follow up to assess improvement in symptoms.

## 2023-10-14 ENCOUNTER — Ambulatory Visit: Payer: 59

## 2023-10-15 ENCOUNTER — Ambulatory Visit
Admission: RE | Admit: 2023-10-15 | Discharge: 2023-10-15 | Disposition: A | Discharge status: Home / Self Care | Payer: 59 | Source: Ambulatory Visit | Attending: Radiation Oncology | Admitting: Radiation Oncology

## 2023-10-15 ENCOUNTER — Other Ambulatory Visit: Payer: Self-pay

## 2023-10-15 ENCOUNTER — Other Ambulatory Visit: Payer: Self-pay | Admitting: Radiation Oncology

## 2023-10-15 DIAGNOSIS — C61 Malignant neoplasm of prostate: Secondary | ICD-10-CM | POA: Diagnosis not present

## 2023-10-15 DIAGNOSIS — Z510 Encounter for antineoplastic radiation therapy: Secondary | ICD-10-CM | POA: Diagnosis not present

## 2023-10-15 DIAGNOSIS — R300 Dysuria: Secondary | ICD-10-CM | POA: Diagnosis not present

## 2023-10-15 DIAGNOSIS — Z191 Hormone sensitive malignancy status: Secondary | ICD-10-CM | POA: Diagnosis not present

## 2023-10-15 LAB — RAD ONC ARIA SESSION SUMMARY
Course Elapsed Days: 15
Plan Fractions Treated to Date: 6
Plan Prescribed Dose Per Fraction: 2.5 Gy
Plan Total Fractions Prescribed: 28
Plan Total Prescribed Dose: 70 Gy
Reference Point Dosage Given to Date: 15 Gy
Reference Point Session Dosage Given: 2.5 Gy
Session Number: 6

## 2023-10-15 MED ORDER — TAMSULOSIN HCL 0.4 MG PO CAPS: 0.4000 mg | ORAL_CAPSULE | Freq: Every day | ORAL | 5 refills | Status: AC

## 2023-10-16 ENCOUNTER — Ambulatory Visit
Admission: RE | Admit: 2023-10-16 | Discharge: 2023-10-16 | Disposition: A | Discharge status: Home / Self Care | Payer: 59 | Source: Ambulatory Visit | Attending: Radiation Oncology

## 2023-10-16 ENCOUNTER — Other Ambulatory Visit: Payer: Self-pay

## 2023-10-16 DIAGNOSIS — Z191 Hormone sensitive malignancy status: Secondary | ICD-10-CM | POA: Diagnosis not present

## 2023-10-16 DIAGNOSIS — R300 Dysuria: Secondary | ICD-10-CM | POA: Diagnosis not present

## 2023-10-16 DIAGNOSIS — Z510 Encounter for antineoplastic radiation therapy: Secondary | ICD-10-CM | POA: Diagnosis not present

## 2023-10-16 DIAGNOSIS — C61 Malignant neoplasm of prostate: Secondary | ICD-10-CM | POA: Diagnosis not present

## 2023-10-16 LAB — RAD ONC ARIA SESSION SUMMARY
Course Elapsed Days: 16
Plan Fractions Treated to Date: 7
Plan Prescribed Dose Per Fraction: 2.5 Gy
Plan Total Fractions Prescribed: 28
Plan Total Prescribed Dose: 70 Gy
Reference Point Dosage Given to Date: 17.5 Gy
Reference Point Session Dosage Given: 2.5 Gy
Session Number: 7

## 2023-10-16 NOTE — Progress Notes (Signed)
RN spoke with patient and patient's son to follow up with symptoms since starting medication, Flomax.  Patient reports pharmacy did not receive medication order.     RN verbally called Rx into [facility].   Patient and son aware, will follow up next week to assess symptoms.

## 2023-10-19 ENCOUNTER — Ambulatory Visit
Admission: RE | Admit: 2023-10-19 | Discharge: 2023-10-19 | Disposition: A | Discharge status: Home / Self Care | Payer: 59 | Source: Ambulatory Visit | Attending: Radiation Oncology | Admitting: Radiation Oncology

## 2023-10-19 ENCOUNTER — Other Ambulatory Visit: Payer: Self-pay

## 2023-10-19 DIAGNOSIS — C61 Malignant neoplasm of prostate: Secondary | ICD-10-CM | POA: Diagnosis not present

## 2023-10-19 DIAGNOSIS — Z191 Hormone sensitive malignancy status: Secondary | ICD-10-CM | POA: Diagnosis not present

## 2023-10-19 DIAGNOSIS — Z510 Encounter for antineoplastic radiation therapy: Secondary | ICD-10-CM | POA: Diagnosis not present

## 2023-10-19 DIAGNOSIS — R300 Dysuria: Secondary | ICD-10-CM | POA: Diagnosis not present

## 2023-10-19 LAB — RAD ONC ARIA SESSION SUMMARY
Course Elapsed Days: 19
Plan Fractions Treated to Date: 8
Plan Prescribed Dose Per Fraction: 2.5 Gy
Plan Total Fractions Prescribed: 28
Plan Total Prescribed Dose: 70 Gy
Reference Point Dosage Given to Date: 20 Gy
Reference Point Session Dosage Given: 2.5 Gy
Session Number: 8

## 2023-10-20 ENCOUNTER — Ambulatory Visit
Admission: RE | Admit: 2023-10-20 | Discharge: 2023-10-20 | Disposition: A | Discharge status: Home / Self Care | Payer: 59 | Source: Ambulatory Visit | Attending: Radiation Oncology

## 2023-10-20 ENCOUNTER — Other Ambulatory Visit: Payer: Self-pay

## 2023-10-20 DIAGNOSIS — R300 Dysuria: Secondary | ICD-10-CM | POA: Diagnosis not present

## 2023-10-20 DIAGNOSIS — Z510 Encounter for antineoplastic radiation therapy: Secondary | ICD-10-CM | POA: Diagnosis not present

## 2023-10-20 DIAGNOSIS — C61 Malignant neoplasm of prostate: Secondary | ICD-10-CM | POA: Diagnosis not present

## 2023-10-20 DIAGNOSIS — Z191 Hormone sensitive malignancy status: Secondary | ICD-10-CM | POA: Diagnosis not present

## 2023-10-20 LAB — RAD ONC ARIA SESSION SUMMARY
Course Elapsed Days: 20
Plan Fractions Treated to Date: 9
Plan Prescribed Dose Per Fraction: 2.5 Gy
Plan Total Fractions Prescribed: 28
Plan Total Prescribed Dose: 70 Gy
Reference Point Dosage Given to Date: 22.5 Gy
Reference Point Session Dosage Given: 2.5 Gy
Session Number: 9

## 2023-10-21 ENCOUNTER — Other Ambulatory Visit: Payer: Self-pay

## 2023-10-21 ENCOUNTER — Ambulatory Visit
Admission: RE | Admit: 2023-10-21 | Discharge: 2023-10-21 | Disposition: A | Discharge status: Home / Self Care | Payer: 59 | Source: Ambulatory Visit | Attending: Radiation Oncology | Admitting: Radiation Oncology

## 2023-10-21 DIAGNOSIS — Z510 Encounter for antineoplastic radiation therapy: Secondary | ICD-10-CM | POA: Diagnosis not present

## 2023-10-21 DIAGNOSIS — C61 Malignant neoplasm of prostate: Secondary | ICD-10-CM | POA: Diagnosis not present

## 2023-10-21 DIAGNOSIS — Z191 Hormone sensitive malignancy status: Secondary | ICD-10-CM | POA: Diagnosis not present

## 2023-10-21 DIAGNOSIS — R300 Dysuria: Secondary | ICD-10-CM | POA: Diagnosis not present

## 2023-10-21 LAB — RAD ONC ARIA SESSION SUMMARY
Course Elapsed Days: 21
Plan Fractions Treated to Date: 10
Plan Prescribed Dose Per Fraction: 2.5 Gy
Plan Total Fractions Prescribed: 28
Plan Total Prescribed Dose: 70 Gy
Reference Point Dosage Given to Date: 25 Gy
Reference Point Session Dosage Given: 2.5 Gy
Session Number: 10

## 2023-10-22 ENCOUNTER — Other Ambulatory Visit: Payer: Self-pay

## 2023-10-22 ENCOUNTER — Ambulatory Visit
Admission: RE | Admit: 2023-10-22 | Discharge: 2023-10-22 | Disposition: A | Discharge status: Home / Self Care | Payer: 59 | Source: Ambulatory Visit | Attending: Radiation Oncology | Admitting: Radiation Oncology

## 2023-10-22 DIAGNOSIS — C61 Malignant neoplasm of prostate: Secondary | ICD-10-CM | POA: Diagnosis not present

## 2023-10-22 DIAGNOSIS — R300 Dysuria: Secondary | ICD-10-CM | POA: Diagnosis not present

## 2023-10-22 DIAGNOSIS — Z191 Hormone sensitive malignancy status: Secondary | ICD-10-CM | POA: Diagnosis not present

## 2023-10-22 DIAGNOSIS — Z510 Encounter for antineoplastic radiation therapy: Secondary | ICD-10-CM | POA: Diagnosis not present

## 2023-10-22 LAB — RAD ONC ARIA SESSION SUMMARY
Course Elapsed Days: 22
Plan Fractions Treated to Date: 11
Plan Prescribed Dose Per Fraction: 2.5 Gy
Plan Total Fractions Prescribed: 28
Plan Total Prescribed Dose: 70 Gy
Reference Point Dosage Given to Date: 27.5 Gy
Reference Point Session Dosage Given: 2.5 Gy
Session Number: 11

## 2023-10-22 NOTE — Progress Notes (Signed)
RN confirmed patient was able to pick up medication.  Patient verbalized he is tolerating treatment Ok so far and will plan to continue with medication, Flomax, and radiation.  RN encouraged patient to reach out if symptoms worsen.

## 2023-10-23 ENCOUNTER — Ambulatory Visit
Admission: RE | Admit: 2023-10-23 | Discharge: 2023-10-23 | Disposition: A | Discharge status: Home / Self Care | Payer: 59 | Source: Ambulatory Visit | Attending: Radiation Oncology | Admitting: Radiation Oncology

## 2023-10-23 ENCOUNTER — Inpatient Hospital Stay: Payer: 59 | Admitting: *Deleted

## 2023-10-23 ENCOUNTER — Other Ambulatory Visit: Payer: Self-pay

## 2023-10-23 ENCOUNTER — Ambulatory Visit: Payer: 59

## 2023-10-23 ENCOUNTER — Other Ambulatory Visit: Payer: Self-pay | Admitting: Radiation Oncology

## 2023-10-23 ENCOUNTER — Ambulatory Visit
Admission: RE | Admit: 2023-10-23 | Discharge: 2023-10-23 | Disposition: A | Discharge status: Home / Self Care | Payer: 59 | Source: Ambulatory Visit | Attending: Radiation Oncology

## 2023-10-23 DIAGNOSIS — C61 Malignant neoplasm of prostate: Secondary | ICD-10-CM

## 2023-10-23 DIAGNOSIS — R300 Dysuria: Secondary | ICD-10-CM | POA: Diagnosis not present

## 2023-10-23 DIAGNOSIS — Z191 Hormone sensitive malignancy status: Secondary | ICD-10-CM | POA: Diagnosis not present

## 2023-10-23 DIAGNOSIS — Z510 Encounter for antineoplastic radiation therapy: Secondary | ICD-10-CM | POA: Diagnosis not present

## 2023-10-23 LAB — RAD ONC ARIA SESSION SUMMARY
Course Elapsed Days: 23
Plan Fractions Treated to Date: 12
Plan Prescribed Dose Per Fraction: 2.5 Gy
Plan Total Fractions Prescribed: 28
Plan Total Prescribed Dose: 70 Gy
Reference Point Dosage Given to Date: 30 Gy
Reference Point Session Dosage Given: 2.5 Gy
Session Number: 12

## 2023-10-23 LAB — URINALYSIS, COMPLETE (UACMP) WITH MICROSCOPIC
Bilirubin Urine: NEGATIVE
Glucose, UA: NEGATIVE mg/dL
Hgb urine dipstick: NEGATIVE
Ketones, ur: NEGATIVE mg/dL
Nitrite: NEGATIVE
Protein, ur: NEGATIVE mg/dL
Specific Gravity, Urine: 1.015 (ref 1.005–1.030)
pH: 6 (ref 5.0–8.0)

## 2023-10-23 MED ORDER — PHENAZOPYRIDINE HCL 200 MG PO TABS: 200.0000 mg | ORAL_TABLET | Freq: Three times a day (TID) | ORAL | 5 refills | Status: AC | PRN

## 2023-10-23 NOTE — Progress Notes (Signed)
Please call patient with normal result.  Thanks. MM 

## 2023-10-24 LAB — URINE CULTURE: Culture: NO GROWTH

## 2023-10-26 ENCOUNTER — Ambulatory Visit
Admission: RE | Admit: 2023-10-26 | Discharge: 2023-10-26 | Disposition: A | Discharge status: Home / Self Care | Payer: 59 | Source: Ambulatory Visit | Attending: Radiation Oncology | Admitting: Radiation Oncology

## 2023-10-26 ENCOUNTER — Other Ambulatory Visit: Payer: Self-pay

## 2023-10-26 DIAGNOSIS — C61 Malignant neoplasm of prostate: Secondary | ICD-10-CM | POA: Diagnosis not present

## 2023-10-26 DIAGNOSIS — Z510 Encounter for antineoplastic radiation therapy: Secondary | ICD-10-CM | POA: Diagnosis not present

## 2023-10-26 DIAGNOSIS — R300 Dysuria: Secondary | ICD-10-CM | POA: Diagnosis not present

## 2023-10-26 DIAGNOSIS — Z191 Hormone sensitive malignancy status: Secondary | ICD-10-CM | POA: Diagnosis not present

## 2023-10-26 LAB — RAD ONC ARIA SESSION SUMMARY
Course Elapsed Days: 26
Plan Fractions Treated to Date: 13
Plan Prescribed Dose Per Fraction: 2.5 Gy
Plan Total Fractions Prescribed: 28
Plan Total Prescribed Dose: 70 Gy
Reference Point Dosage Given to Date: 32.5 Gy
Reference Point Session Dosage Given: 2.5 Gy
Session Number: 13

## 2023-10-27 ENCOUNTER — Other Ambulatory Visit: Payer: Self-pay

## 2023-10-27 ENCOUNTER — Ambulatory Visit
Admission: RE | Admit: 2023-10-27 | Discharge: 2023-10-27 | Disposition: A | Discharge status: Home / Self Care | Payer: 59 | Source: Ambulatory Visit | Attending: Radiation Oncology

## 2023-10-27 DIAGNOSIS — Z191 Hormone sensitive malignancy status: Secondary | ICD-10-CM | POA: Diagnosis not present

## 2023-10-27 DIAGNOSIS — C61 Malignant neoplasm of prostate: Secondary | ICD-10-CM | POA: Diagnosis not present

## 2023-10-27 DIAGNOSIS — R300 Dysuria: Secondary | ICD-10-CM | POA: Diagnosis not present

## 2023-10-27 DIAGNOSIS — Z510 Encounter for antineoplastic radiation therapy: Secondary | ICD-10-CM | POA: Diagnosis not present

## 2023-10-27 LAB — RAD ONC ARIA SESSION SUMMARY
Course Elapsed Days: 27
Plan Fractions Treated to Date: 14
Plan Prescribed Dose Per Fraction: 2.5 Gy
Plan Total Fractions Prescribed: 28
Plan Total Prescribed Dose: 70 Gy
Reference Point Dosage Given to Date: 35 Gy
Reference Point Session Dosage Given: 2.5 Gy
Session Number: 14

## 2023-10-28 ENCOUNTER — Other Ambulatory Visit: Payer: Self-pay | Admitting: Cardiology

## 2023-10-28 ENCOUNTER — Ambulatory Visit
Admission: RE | Admit: 2023-10-28 | Discharge: 2023-10-28 | Disposition: A | Discharge status: Home / Self Care | Payer: 59 | Source: Ambulatory Visit | Attending: Radiation Oncology | Admitting: Radiation Oncology

## 2023-10-28 ENCOUNTER — Other Ambulatory Visit: Payer: Self-pay

## 2023-10-28 DIAGNOSIS — Z191 Hormone sensitive malignancy status: Secondary | ICD-10-CM | POA: Diagnosis not present

## 2023-10-28 DIAGNOSIS — Z510 Encounter for antineoplastic radiation therapy: Secondary | ICD-10-CM | POA: Diagnosis not present

## 2023-10-28 DIAGNOSIS — C61 Malignant neoplasm of prostate: Secondary | ICD-10-CM | POA: Diagnosis not present

## 2023-10-28 DIAGNOSIS — R300 Dysuria: Secondary | ICD-10-CM | POA: Diagnosis not present

## 2023-10-28 LAB — RAD ONC ARIA SESSION SUMMARY
Course Elapsed Days: 28
Plan Fractions Treated to Date: 15
Plan Prescribed Dose Per Fraction: 2.5 Gy
Plan Total Fractions Prescribed: 28
Plan Total Prescribed Dose: 70 Gy
Reference Point Dosage Given to Date: 37.5 Gy
Reference Point Session Dosage Given: 2.5 Gy
Session Number: 15

## 2023-10-29 ENCOUNTER — Other Ambulatory Visit: Payer: Self-pay

## 2023-10-29 ENCOUNTER — Ambulatory Visit
Admission: RE | Admit: 2023-10-29 | Discharge: 2023-10-29 | Disposition: A | Discharge status: Home / Self Care | Payer: 59 | Source: Ambulatory Visit | Attending: Radiation Oncology | Admitting: Radiation Oncology

## 2023-10-29 DIAGNOSIS — Z510 Encounter for antineoplastic radiation therapy: Secondary | ICD-10-CM | POA: Diagnosis not present

## 2023-10-29 DIAGNOSIS — R300 Dysuria: Secondary | ICD-10-CM | POA: Diagnosis not present

## 2023-10-29 DIAGNOSIS — C61 Malignant neoplasm of prostate: Secondary | ICD-10-CM | POA: Diagnosis not present

## 2023-10-29 DIAGNOSIS — Z191 Hormone sensitive malignancy status: Secondary | ICD-10-CM | POA: Diagnosis not present

## 2023-10-29 LAB — RAD ONC ARIA SESSION SUMMARY
Course Elapsed Days: 29
Plan Fractions Treated to Date: 16
Plan Prescribed Dose Per Fraction: 2.5 Gy
Plan Total Fractions Prescribed: 28
Plan Total Prescribed Dose: 70 Gy
Reference Point Dosage Given to Date: 40 Gy
Reference Point Session Dosage Given: 2.5 Gy
Session Number: 16

## 2023-10-30 ENCOUNTER — Ambulatory Visit
Admission: RE | Admit: 2023-10-30 | Discharge: 2023-10-30 | Disposition: A | Discharge status: Home / Self Care | Payer: 59 | Source: Ambulatory Visit | Attending: Radiation Oncology | Admitting: Radiation Oncology

## 2023-10-30 ENCOUNTER — Other Ambulatory Visit: Payer: Self-pay

## 2023-10-30 DIAGNOSIS — Z191 Hormone sensitive malignancy status: Secondary | ICD-10-CM | POA: Diagnosis not present

## 2023-10-30 DIAGNOSIS — R300 Dysuria: Secondary | ICD-10-CM | POA: Diagnosis not present

## 2023-10-30 DIAGNOSIS — C61 Malignant neoplasm of prostate: Secondary | ICD-10-CM | POA: Insufficient documentation

## 2023-10-30 DIAGNOSIS — Z510 Encounter for antineoplastic radiation therapy: Secondary | ICD-10-CM | POA: Diagnosis not present

## 2023-10-30 LAB — RAD ONC ARIA SESSION SUMMARY
Course Elapsed Days: 30
Plan Fractions Treated to Date: 17
Plan Prescribed Dose Per Fraction: 2.5 Gy
Plan Total Fractions Prescribed: 28
Plan Total Prescribed Dose: 70 Gy
Reference Point Dosage Given to Date: 42.5 Gy
Reference Point Session Dosage Given: 2.5 Gy
Session Number: 17

## 2023-11-02 ENCOUNTER — Other Ambulatory Visit: Payer: Self-pay

## 2023-11-02 ENCOUNTER — Ambulatory Visit
Admission: RE | Admit: 2023-11-02 | Discharge: 2023-11-02 | Disposition: A | Discharge status: Home / Self Care | Payer: 59 | Source: Ambulatory Visit | Attending: Radiation Oncology | Admitting: Radiation Oncology

## 2023-11-02 DIAGNOSIS — Z510 Encounter for antineoplastic radiation therapy: Secondary | ICD-10-CM | POA: Diagnosis not present

## 2023-11-02 DIAGNOSIS — R300 Dysuria: Secondary | ICD-10-CM | POA: Diagnosis not present

## 2023-11-02 DIAGNOSIS — C61 Malignant neoplasm of prostate: Secondary | ICD-10-CM | POA: Diagnosis not present

## 2023-11-02 DIAGNOSIS — Z191 Hormone sensitive malignancy status: Secondary | ICD-10-CM | POA: Diagnosis not present

## 2023-11-02 LAB — RAD ONC ARIA SESSION SUMMARY
Course Elapsed Days: 33
Plan Fractions Treated to Date: 18
Plan Prescribed Dose Per Fraction: 2.5 Gy
Plan Total Fractions Prescribed: 28
Plan Total Prescribed Dose: 70 Gy
Reference Point Dosage Given to Date: 45 Gy
Reference Point Session Dosage Given: 2.5 Gy
Session Number: 18

## 2023-11-03 ENCOUNTER — Ambulatory Visit
Admission: RE | Admit: 2023-11-03 | Discharge: 2023-11-03 | Disposition: A | Discharge status: Home / Self Care | Payer: 59 | Source: Ambulatory Visit | Attending: Radiation Oncology | Admitting: Radiation Oncology

## 2023-11-03 ENCOUNTER — Other Ambulatory Visit: Payer: Self-pay

## 2023-11-03 DIAGNOSIS — R300 Dysuria: Secondary | ICD-10-CM | POA: Diagnosis not present

## 2023-11-03 DIAGNOSIS — C61 Malignant neoplasm of prostate: Secondary | ICD-10-CM | POA: Diagnosis not present

## 2023-11-03 DIAGNOSIS — Z510 Encounter for antineoplastic radiation therapy: Secondary | ICD-10-CM | POA: Diagnosis not present

## 2023-11-03 DIAGNOSIS — Z191 Hormone sensitive malignancy status: Secondary | ICD-10-CM | POA: Diagnosis not present

## 2023-11-03 LAB — RAD ONC ARIA SESSION SUMMARY
Course Elapsed Days: 34
Plan Fractions Treated to Date: 19
Plan Prescribed Dose Per Fraction: 2.5 Gy
Plan Total Fractions Prescribed: 28
Plan Total Prescribed Dose: 70 Gy
Reference Point Dosage Given to Date: 47.5 Gy
Reference Point Session Dosage Given: 2.5 Gy
Session Number: 19

## 2023-11-04 ENCOUNTER — Other Ambulatory Visit: Payer: Self-pay

## 2023-11-04 ENCOUNTER — Ambulatory Visit
Admission: RE | Admit: 2023-11-04 | Discharge: 2023-11-04 | Disposition: A | Discharge status: Home / Self Care | Payer: 59 | Source: Ambulatory Visit | Attending: Radiation Oncology | Admitting: Radiation Oncology

## 2023-11-04 DIAGNOSIS — R300 Dysuria: Secondary | ICD-10-CM | POA: Diagnosis not present

## 2023-11-04 DIAGNOSIS — Z510 Encounter for antineoplastic radiation therapy: Secondary | ICD-10-CM | POA: Diagnosis not present

## 2023-11-04 DIAGNOSIS — C61 Malignant neoplasm of prostate: Secondary | ICD-10-CM | POA: Diagnosis not present

## 2023-11-04 DIAGNOSIS — Z191 Hormone sensitive malignancy status: Secondary | ICD-10-CM | POA: Diagnosis not present

## 2023-11-04 LAB — RAD ONC ARIA SESSION SUMMARY
Course Elapsed Days: 35
Plan Fractions Treated to Date: 20
Plan Prescribed Dose Per Fraction: 2.5 Gy
Plan Total Fractions Prescribed: 28
Plan Total Prescribed Dose: 70 Gy
Reference Point Dosage Given to Date: 50 Gy
Reference Point Session Dosage Given: 2.5 Gy
Session Number: 20

## 2023-11-05 ENCOUNTER — Ambulatory Visit
Admission: RE | Admit: 2023-11-05 | Discharge: 2023-11-05 | Disposition: A | Discharge status: Home / Self Care | Payer: 59 | Source: Ambulatory Visit | Attending: Radiation Oncology | Admitting: Radiation Oncology

## 2023-11-05 ENCOUNTER — Other Ambulatory Visit: Payer: Self-pay

## 2023-11-05 ENCOUNTER — Ambulatory Visit: Payer: 59

## 2023-11-05 DIAGNOSIS — C61 Malignant neoplasm of prostate: Secondary | ICD-10-CM | POA: Diagnosis not present

## 2023-11-05 DIAGNOSIS — Z510 Encounter for antineoplastic radiation therapy: Secondary | ICD-10-CM | POA: Diagnosis not present

## 2023-11-05 DIAGNOSIS — R300 Dysuria: Secondary | ICD-10-CM | POA: Diagnosis not present

## 2023-11-05 DIAGNOSIS — Z191 Hormone sensitive malignancy status: Secondary | ICD-10-CM | POA: Diagnosis not present

## 2023-11-05 LAB — RAD ONC ARIA SESSION SUMMARY
Course Elapsed Days: 36
Plan Fractions Treated to Date: 21
Plan Prescribed Dose Per Fraction: 2.5 Gy
Plan Total Fractions Prescribed: 28
Plan Total Prescribed Dose: 70 Gy
Reference Point Dosage Given to Date: 52.5 Gy
Reference Point Session Dosage Given: 2.5 Gy
Session Number: 21

## 2023-11-06 ENCOUNTER — Ambulatory Visit: Payer: 59

## 2023-11-06 ENCOUNTER — Ambulatory Visit
Admission: RE | Admit: 2023-11-06 | Discharge: 2023-11-06 | Disposition: A | Discharge status: Home / Self Care | Payer: 59 | Source: Ambulatory Visit | Attending: Radiation Oncology | Admitting: Radiation Oncology

## 2023-11-06 ENCOUNTER — Other Ambulatory Visit: Payer: Self-pay

## 2023-11-06 DIAGNOSIS — Z510 Encounter for antineoplastic radiation therapy: Secondary | ICD-10-CM | POA: Diagnosis not present

## 2023-11-06 DIAGNOSIS — R300 Dysuria: Secondary | ICD-10-CM | POA: Diagnosis not present

## 2023-11-06 DIAGNOSIS — Z191 Hormone sensitive malignancy status: Secondary | ICD-10-CM | POA: Diagnosis not present

## 2023-11-06 DIAGNOSIS — C61 Malignant neoplasm of prostate: Secondary | ICD-10-CM | POA: Diagnosis not present

## 2023-11-06 LAB — RAD ONC ARIA SESSION SUMMARY
Course Elapsed Days: 37
Plan Fractions Treated to Date: 22
Plan Prescribed Dose Per Fraction: 2.5 Gy
Plan Total Fractions Prescribed: 28
Plan Total Prescribed Dose: 70 Gy
Reference Point Dosage Given to Date: 55 Gy
Reference Point Session Dosage Given: 2.5 Gy
Session Number: 22

## 2023-11-09 ENCOUNTER — Ambulatory Visit
Admission: RE | Admit: 2023-11-09 | Discharge: 2023-11-09 | Disposition: A | Discharge status: Home / Self Care | Payer: 59 | Source: Ambulatory Visit | Attending: Radiation Oncology | Admitting: Radiation Oncology

## 2023-11-09 ENCOUNTER — Other Ambulatory Visit: Payer: Self-pay

## 2023-11-09 DIAGNOSIS — C61 Malignant neoplasm of prostate: Secondary | ICD-10-CM | POA: Diagnosis not present

## 2023-11-09 DIAGNOSIS — Z510 Encounter for antineoplastic radiation therapy: Secondary | ICD-10-CM | POA: Diagnosis not present

## 2023-11-09 DIAGNOSIS — R300 Dysuria: Secondary | ICD-10-CM | POA: Diagnosis not present

## 2023-11-09 DIAGNOSIS — Z191 Hormone sensitive malignancy status: Secondary | ICD-10-CM | POA: Diagnosis not present

## 2023-11-09 LAB — RAD ONC ARIA SESSION SUMMARY
Course Elapsed Days: 40
Plan Fractions Treated to Date: 23
Plan Prescribed Dose Per Fraction: 2.5 Gy
Plan Total Fractions Prescribed: 28
Plan Total Prescribed Dose: 70 Gy
Reference Point Dosage Given to Date: 57.5 Gy
Reference Point Session Dosage Given: 2.5 Gy
Session Number: 23

## 2023-11-10 ENCOUNTER — Ambulatory Visit
Admission: RE | Admit: 2023-11-10 | Discharge: 2023-11-10 | Disposition: A | Discharge status: Home / Self Care | Payer: 59 | Source: Ambulatory Visit | Attending: Radiation Oncology | Admitting: Radiation Oncology

## 2023-11-10 ENCOUNTER — Other Ambulatory Visit: Payer: Self-pay

## 2023-11-10 DIAGNOSIS — Z510 Encounter for antineoplastic radiation therapy: Secondary | ICD-10-CM | POA: Diagnosis not present

## 2023-11-10 DIAGNOSIS — C61 Malignant neoplasm of prostate: Secondary | ICD-10-CM | POA: Diagnosis not present

## 2023-11-10 DIAGNOSIS — R300 Dysuria: Secondary | ICD-10-CM | POA: Diagnosis not present

## 2023-11-10 DIAGNOSIS — Z191 Hormone sensitive malignancy status: Secondary | ICD-10-CM | POA: Diagnosis not present

## 2023-11-10 LAB — RAD ONC ARIA SESSION SUMMARY
Course Elapsed Days: 41
Plan Fractions Treated to Date: 24
Plan Prescribed Dose Per Fraction: 2.5 Gy
Plan Total Fractions Prescribed: 28
Plan Total Prescribed Dose: 70 Gy
Reference Point Dosage Given to Date: 60 Gy
Reference Point Session Dosage Given: 2.5 Gy
Session Number: 24

## 2023-11-11 ENCOUNTER — Ambulatory Visit
Admission: RE | Admit: 2023-11-11 | Discharge: 2023-11-11 | Disposition: A | Discharge status: Home / Self Care | Payer: 59 | Source: Ambulatory Visit | Attending: Radiation Oncology

## 2023-11-11 ENCOUNTER — Other Ambulatory Visit: Payer: Self-pay

## 2023-11-11 ENCOUNTER — Ambulatory Visit: Payer: 59

## 2023-11-11 DIAGNOSIS — C61 Malignant neoplasm of prostate: Secondary | ICD-10-CM | POA: Diagnosis not present

## 2023-11-11 DIAGNOSIS — Z191 Hormone sensitive malignancy status: Secondary | ICD-10-CM | POA: Diagnosis not present

## 2023-11-11 DIAGNOSIS — R300 Dysuria: Secondary | ICD-10-CM | POA: Diagnosis not present

## 2023-11-11 DIAGNOSIS — Z510 Encounter for antineoplastic radiation therapy: Secondary | ICD-10-CM | POA: Diagnosis not present

## 2023-11-11 LAB — RAD ONC ARIA SESSION SUMMARY
Course Elapsed Days: 42
Plan Fractions Treated to Date: 25
Plan Prescribed Dose Per Fraction: 2.5 Gy
Plan Total Fractions Prescribed: 28
Plan Total Prescribed Dose: 70 Gy
Reference Point Dosage Given to Date: 62.5 Gy
Reference Point Session Dosage Given: 2.5 Gy
Session Number: 25

## 2023-11-12 ENCOUNTER — Ambulatory Visit
Admission: RE | Admit: 2023-11-12 | Discharge: 2023-11-12 | Disposition: A | Discharge status: Home / Self Care | Payer: 59 | Source: Ambulatory Visit | Attending: Radiation Oncology | Admitting: Radiation Oncology

## 2023-11-12 ENCOUNTER — Other Ambulatory Visit: Payer: Self-pay

## 2023-11-12 DIAGNOSIS — Z191 Hormone sensitive malignancy status: Secondary | ICD-10-CM | POA: Diagnosis not present

## 2023-11-12 DIAGNOSIS — C61 Malignant neoplasm of prostate: Secondary | ICD-10-CM | POA: Diagnosis not present

## 2023-11-12 DIAGNOSIS — Z510 Encounter for antineoplastic radiation therapy: Secondary | ICD-10-CM | POA: Diagnosis not present

## 2023-11-12 DIAGNOSIS — R300 Dysuria: Secondary | ICD-10-CM | POA: Diagnosis not present

## 2023-11-12 LAB — RAD ONC ARIA SESSION SUMMARY
Course Elapsed Days: 43
Plan Fractions Treated to Date: 26
Plan Prescribed Dose Per Fraction: 2.5 Gy
Plan Total Fractions Prescribed: 28
Plan Total Prescribed Dose: 70 Gy
Reference Point Dosage Given to Date: 65 Gy
Reference Point Session Dosage Given: 2.5 Gy
Session Number: 26

## 2023-11-13 ENCOUNTER — Ambulatory Visit
Admission: RE | Admit: 2023-11-13 | Discharge: 2023-11-13 | Disposition: A | Discharge status: Home / Self Care | Payer: 59 | Source: Ambulatory Visit | Attending: Radiation Oncology | Admitting: Radiation Oncology

## 2023-11-13 ENCOUNTER — Ambulatory Visit
Admission: RE | Admit: 2023-11-13 | Discharge: 2023-11-13 | Disposition: A | Discharge status: Home / Self Care | Payer: 59 | Source: Ambulatory Visit | Attending: Radiation Oncology

## 2023-11-13 ENCOUNTER — Other Ambulatory Visit: Payer: Self-pay

## 2023-11-13 DIAGNOSIS — R300 Dysuria: Secondary | ICD-10-CM | POA: Diagnosis not present

## 2023-11-13 DIAGNOSIS — C61 Malignant neoplasm of prostate: Secondary | ICD-10-CM | POA: Diagnosis not present

## 2023-11-13 DIAGNOSIS — Z510 Encounter for antineoplastic radiation therapy: Secondary | ICD-10-CM | POA: Diagnosis not present

## 2023-11-13 DIAGNOSIS — Z191 Hormone sensitive malignancy status: Secondary | ICD-10-CM | POA: Diagnosis not present

## 2023-11-13 LAB — RAD ONC ARIA SESSION SUMMARY
Course Elapsed Days: 44
Plan Fractions Treated to Date: 27
Plan Prescribed Dose Per Fraction: 2.5 Gy
Plan Total Fractions Prescribed: 28
Plan Total Prescribed Dose: 70 Gy
Reference Point Dosage Given to Date: 67.5 Gy
Reference Point Session Dosage Given: 2.5 Gy
Session Number: 27

## 2023-11-16 ENCOUNTER — Other Ambulatory Visit: Payer: Self-pay

## 2023-11-16 ENCOUNTER — Ambulatory Visit
Admission: RE | Admit: 2023-11-16 | Discharge: 2023-11-16 | Disposition: A | Discharge status: Home / Self Care | Payer: 59 | Source: Ambulatory Visit | Attending: Radiation Oncology | Admitting: Radiation Oncology

## 2023-11-16 DIAGNOSIS — Z510 Encounter for antineoplastic radiation therapy: Secondary | ICD-10-CM | POA: Diagnosis not present

## 2023-11-16 DIAGNOSIS — C61 Malignant neoplasm of prostate: Secondary | ICD-10-CM | POA: Diagnosis not present

## 2023-11-16 DIAGNOSIS — R300 Dysuria: Secondary | ICD-10-CM | POA: Diagnosis not present

## 2023-11-16 DIAGNOSIS — Z191 Hormone sensitive malignancy status: Secondary | ICD-10-CM | POA: Diagnosis not present

## 2023-11-16 LAB — RAD ONC ARIA SESSION SUMMARY
Course Elapsed Days: 47
Plan Fractions Treated to Date: 28
Plan Prescribed Dose Per Fraction: 2.5 Gy
Plan Total Fractions Prescribed: 28
Plan Total Prescribed Dose: 70 Gy
Reference Point Dosage Given to Date: 70 Gy
Reference Point Session Dosage Given: 2.5 Gy
Session Number: 28

## 2023-11-17 NOTE — Radiation Completion Notes (Addendum)
  Radiation Oncology         [phone] ________________________________  Name: [name] [name] MRN: [id number]  Date: 11/16/2023  DOB: Sep 14, 1961  Referring Physician: ELSIE LESCHES, M.D. Date of Service: 2023-11-17 Radiation Oncologist: Adina Barge, M.D. [facility]     RADIATION ONCOLOGY END OF TREATMENT NOTE     Diagnosis:  62 y.o. 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   Intent: Curative     ==========DELIVERED PLANS==========  First Treatment Date: 2023-09-30 - Last Treatment Date: 2023-11-16   Plan Name: Prostate Site: Prostate Technique: IMRT Mode: Photon Dose Per Fraction: 2.5 Gy Prescribed Dose (Delivered / Prescribed): 70 Gy / 70 Gy Prescribed Fxs (Delivered / Prescribed): 28 / 28     ==========ON TREATMENT VISIT DATES========== 2023-10-02, 2023-10-15, 2023-10-23, 2023-10-30, 2023-11-05, 2023-11-13   See weekly On Treatment Notes in Epic for details.  He tolerated the daily radiation treatments relatively well with some increased LUTS and modest fatigue.  The patient will receive a call in about one month from the radiation oncology department. He will continue follow up with Dr. Carolee as well.  ------------------------------------------------   Donnice Barge, MD [facility]  Radiation Oncology Direct Dial: [phone]  Fax: [phone] [email or web address]  Skype  LinkedIn

## 2023-12-03 NOTE — Progress Notes (Signed)
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  Patient proceed with treatment recommendations of 5.5 weeks of radiation and had his final radiation treatment on 11/16/23.   Patient will be scheduled for a post treatment nurse call on and has his follow up with Dr. Alvester Morin at [facility] on 02/15/24 at 1:45pm.  Urology office will contact patient to set up PSA check once they receive order from MD.   RN spoke with patient and provided apt information and education on post treatment PSA monitoring.  All questions answered.  No additional needs at this time.

## 2023-12-15 ENCOUNTER — Ambulatory Visit
Admission: RE | Admit: 2023-12-15 | Discharge: 2023-12-15 | Disposition: A | Discharge status: Home / Self Care | Payer: 59 | Source: Ambulatory Visit | Attending: Radiation Oncology | Admitting: Radiation Oncology

## 2023-12-15 DIAGNOSIS — Z510 Encounter for antineoplastic radiation therapy: Secondary | ICD-10-CM | POA: Insufficient documentation

## 2023-12-15 DIAGNOSIS — R300 Dysuria: Secondary | ICD-10-CM | POA: Insufficient documentation

## 2023-12-15 DIAGNOSIS — C61 Malignant neoplasm of prostate: Secondary | ICD-10-CM | POA: Insufficient documentation

## 2023-12-15 NOTE — Progress Notes (Signed)
  Radiation Oncology         [phone] ________________________________  Name: [name] [name] MRN: [id number]  Date of Service: 12/15/2023  DOB: 12-13-1961  Post Treatment Telephone Note  Diagnosis:  C61 Malignant neoplasm of prostate (as documented in provider EOT note)  Pre Treatment IPSS Score: 23 (as documented in the provider consult note)  The patient was available for call today.   Symptoms of fatigue have improved since completing therapy.  Symptoms of bladder changes have improved since completing therapy. Current symptoms include none, and medications for bladder symptoms include none.  Symptoms of bowel changes have improved since completing therapy. Current symptoms include none, and medications for bowel symptoms include none.   Post Treatment IPSS Score: IPSS Questionnaire (AUA-7): Over the past month.   1)  How often have you had a sensation of not emptying your bladder completely after you finish urinating?  0 - Not at all  2)  How often have you had to urinate again less than two hours after you finished urinating? 0 - Not at all  3)  How often have you found you stopped and started again several times when you urinated?  0 - Not at all  4) How difficult have you found it to postpone urination?  0 - Not at all  5) How often have you had a weak urinary stream?  0 - Not at all  6) How often have you had to push or strain to begin urination?  0 - Not at all  7) How many times did you most typically get up to urinate from the time you went to bed until the time you got up in the morning?  5 - 5+ times  Total score:  5. Which indicates mild symptoms  0-7 mildly symptomatic   8-19 moderately symptomatic   20-35 severely symptomatic    Patient does not currently have any scheduled follow up with his urologist to his knowledge so he was advised to call [facility] to schedule his post-treatment follow up with Dr. Alvester Morin for ongoing surveillance. He was  counseled that PSA levels will be drawn in the urology office, and was reassured that additional time is expected to improve bowel and bladder symptoms. He was encouraged to call back with concerns or questions regarding radiation.   This concludes the interaction.  Ruel Favors, LPN

## 2024-01-25 DIAGNOSIS — Z5321 Procedure and treatment not carried out due to patient leaving prior to being seen by health care provider: Secondary | ICD-10-CM | POA: Diagnosis not present

## 2024-01-25 DIAGNOSIS — R0989 Other specified symptoms and signs involving the circulatory and respiratory systems: Secondary | ICD-10-CM | POA: Diagnosis not present

## 2024-01-25 DIAGNOSIS — R059 Cough, unspecified: Secondary | ICD-10-CM | POA: Diagnosis not present

## 2024-02-15 DIAGNOSIS — R3915 Urgency of urination: Secondary | ICD-10-CM | POA: Diagnosis not present

## 2024-02-15 DIAGNOSIS — R350 Frequency of micturition: Secondary | ICD-10-CM | POA: Diagnosis not present

## 2024-04-16 DIAGNOSIS — R42 Dizziness and giddiness: Secondary | ICD-10-CM | POA: Diagnosis not present

## 2024-04-16 DIAGNOSIS — R9431 Abnormal electrocardiogram [ECG] [EKG]: Secondary | ICD-10-CM | POA: Diagnosis not present

## 2024-04-16 DIAGNOSIS — L02414 Cutaneous abscess of left upper limb: Secondary | ICD-10-CM | POA: Diagnosis not present

## 2024-04-16 DIAGNOSIS — G459 Transient cerebral ischemic attack, unspecified: Secondary | ICD-10-CM | POA: Diagnosis not present

## 2024-04-16 DIAGNOSIS — Z888 Allergy status to other drugs, medicaments and biological substances status: Secondary | ICD-10-CM | POA: Diagnosis not present

## 2024-04-16 DIAGNOSIS — Z886 Allergy status to analgesic agent status: Secondary | ICD-10-CM | POA: Diagnosis not present

## 2024-04-16 DIAGNOSIS — M549 Dorsalgia, unspecified: Secondary | ICD-10-CM | POA: Diagnosis not present

## 2024-04-16 DIAGNOSIS — Z5329 Procedure and treatment not carried out because of patient's decision for other reasons: Secondary | ICD-10-CM | POA: Diagnosis not present

## 2024-04-16 DIAGNOSIS — L0231 Cutaneous abscess of buttock: Secondary | ICD-10-CM | POA: Diagnosis not present

## 2024-04-16 DIAGNOSIS — Z880 Allergy status to penicillin: Secondary | ICD-10-CM | POA: Diagnosis not present

## 2024-04-16 DIAGNOSIS — J449 Chronic obstructive pulmonary disease, unspecified: Secondary | ICD-10-CM | POA: Diagnosis not present

## 2024-04-16 DIAGNOSIS — I2510 Atherosclerotic heart disease of native coronary artery without angina pectoris: Secondary | ICD-10-CM | POA: Diagnosis not present

## 2024-04-16 DIAGNOSIS — R4781 Slurred speech: Secondary | ICD-10-CM | POA: Diagnosis not present

## 2024-04-16 DIAGNOSIS — Z792 Long term (current) use of antibiotics: Secondary | ICD-10-CM | POA: Diagnosis not present

## 2024-04-16 DIAGNOSIS — I6501 Occlusion and stenosis of right vertebral artery: Secondary | ICD-10-CM | POA: Diagnosis not present

## 2024-04-16 DIAGNOSIS — I671 Cerebral aneurysm, nonruptured: Secondary | ICD-10-CM | POA: Diagnosis not present

## 2024-04-16 DIAGNOSIS — I6522 Occlusion and stenosis of left carotid artery: Secondary | ICD-10-CM | POA: Diagnosis not present

## 2024-04-16 DIAGNOSIS — Z7951 Long term (current) use of inhaled steroids: Secondary | ICD-10-CM | POA: Diagnosis not present

## 2024-04-16 DIAGNOSIS — G8929 Other chronic pain: Secondary | ICD-10-CM | POA: Diagnosis not present

## 2024-04-16 DIAGNOSIS — E785 Hyperlipidemia, unspecified: Secondary | ICD-10-CM | POA: Diagnosis not present

## 2024-04-16 DIAGNOSIS — I10 Essential (primary) hypertension: Secondary | ICD-10-CM | POA: Diagnosis not present

## 2024-04-16 DIAGNOSIS — L02212 Cutaneous abscess of back [any part, except buttock]: Secondary | ICD-10-CM | POA: Diagnosis not present

## 2024-04-16 DIAGNOSIS — I4510 Unspecified right bundle-branch block: Secondary | ICD-10-CM | POA: Diagnosis not present

## 2024-04-16 DIAGNOSIS — Z7982 Long term (current) use of aspirin: Secondary | ICD-10-CM | POA: Diagnosis not present

## 2024-06-02 DIAGNOSIS — E1169 Type 2 diabetes mellitus with other specified complication: Secondary | ICD-10-CM | POA: Diagnosis not present

## 2024-06-02 DIAGNOSIS — M109 Gout, unspecified: Secondary | ICD-10-CM | POA: Diagnosis not present

## 2024-06-02 DIAGNOSIS — E7800 Pure hypercholesterolemia, unspecified: Secondary | ICD-10-CM | POA: Diagnosis not present

## 2024-06-02 DIAGNOSIS — G894 Chronic pain syndrome: Secondary | ICD-10-CM | POA: Diagnosis not present

## 2024-06-02 DIAGNOSIS — J449 Chronic obstructive pulmonary disease, unspecified: Secondary | ICD-10-CM | POA: Diagnosis not present

## 2024-06-02 DIAGNOSIS — Z0000 Encounter for general adult medical examination without abnormal findings: Secondary | ICD-10-CM | POA: Diagnosis not present

## 2024-06-02 DIAGNOSIS — I10 Essential (primary) hypertension: Secondary | ICD-10-CM | POA: Diagnosis not present

## 2024-06-02 DIAGNOSIS — I2510 Atherosclerotic heart disease of native coronary artery without angina pectoris: Secondary | ICD-10-CM | POA: Diagnosis not present

## 2024-06-02 DIAGNOSIS — F5101 Primary insomnia: Secondary | ICD-10-CM | POA: Diagnosis not present

## 2024-06-24 ENCOUNTER — Encounter: Payer: Self-pay | Admitting: Family Medicine

## 2024-06-30 DIAGNOSIS — L02419 Cutaneous abscess of limb, unspecified: Secondary | ICD-10-CM | POA: Diagnosis not present

## 2024-06-30 DIAGNOSIS — Z79899 Other long term (current) drug therapy: Secondary | ICD-10-CM | POA: Diagnosis not present

## 2024-06-30 DIAGNOSIS — Z880 Allergy status to penicillin: Secondary | ICD-10-CM | POA: Diagnosis not present

## 2024-06-30 DIAGNOSIS — I10 Essential (primary) hypertension: Secondary | ICD-10-CM | POA: Diagnosis not present

## 2024-06-30 DIAGNOSIS — F17210 Nicotine dependence, cigarettes, uncomplicated: Secondary | ICD-10-CM | POA: Diagnosis not present

## 2024-06-30 DIAGNOSIS — L02415 Cutaneous abscess of right lower limb: Secondary | ICD-10-CM | POA: Diagnosis not present

## 2024-06-30 DIAGNOSIS — L03115 Cellulitis of right lower limb: Secondary | ICD-10-CM | POA: Diagnosis not present

## 2024-06-30 DIAGNOSIS — J449 Chronic obstructive pulmonary disease, unspecified: Secondary | ICD-10-CM | POA: Diagnosis not present

## 2024-06-30 DIAGNOSIS — G8929 Other chronic pain: Secondary | ICD-10-CM | POA: Diagnosis not present

## 2024-09-23 ENCOUNTER — Other Ambulatory Visit: Payer: Self-pay | Admitting: Cardiology

## 2024-10-15 ENCOUNTER — Other Ambulatory Visit: Payer: Self-pay | Admitting: Cardiology

## 2025-01-06 ENCOUNTER — Other Ambulatory Visit: Payer: Self-pay | Admitting: Cardiology

## 2025-01-19 ENCOUNTER — Other Ambulatory Visit: Payer: Self-pay | Admitting: Neurosurgery

## 2025-01-22 ENCOUNTER — Other Ambulatory Visit: Payer: Self-pay | Admitting: Cardiology

## 2025-02-15 ENCOUNTER — Ambulatory Visit (HOSPITAL_COMMUNITY): Admit: 2025-02-15 | Admitting: Neurosurgery
